# Patient Record
Sex: Female | Born: 1935 | Race: White | Hispanic: No | Smoking: Former smoker
Health system: Southern US, Community
[De-identification: ages and names within clinical notes are randomized; demographics above are authoritative.]

## PROBLEM LIST (undated history)

## (undated) DIAGNOSIS — G459 Transient cerebral ischemic attack, unspecified: Secondary | ICD-10-CM

## (undated) DIAGNOSIS — M2550 Pain in unspecified joint: Secondary | ICD-10-CM

## (undated) DIAGNOSIS — I5032 Chronic diastolic (congestive) heart failure: Secondary | ICD-10-CM

## (undated) DIAGNOSIS — S72002A Fracture of unspecified part of neck of left femur, initial encounter for closed fracture: Secondary | ICD-10-CM

## (undated) DIAGNOSIS — I10 Essential (primary) hypertension: Secondary | ICD-10-CM

## (undated) DIAGNOSIS — I739 Peripheral vascular disease, unspecified: Secondary | ICD-10-CM

## (undated) DIAGNOSIS — I341 Nonrheumatic mitral (valve) prolapse: Secondary | ICD-10-CM

## (undated) DIAGNOSIS — J449 Chronic obstructive pulmonary disease, unspecified: Secondary | ICD-10-CM

## (undated) DIAGNOSIS — R079 Chest pain, unspecified: Secondary | ICD-10-CM

## (undated) DIAGNOSIS — Z91199 Patient's noncompliance with other medical treatment and regimen due to unspecified reason: Secondary | ICD-10-CM

## (undated) DIAGNOSIS — D649 Anemia, unspecified: Secondary | ICD-10-CM

## (undated) DIAGNOSIS — E785 Hyperlipidemia, unspecified: Secondary | ICD-10-CM

## (undated) DIAGNOSIS — K219 Gastro-esophageal reflux disease without esophagitis: Secondary | ICD-10-CM

## (undated) DIAGNOSIS — R55 Syncope and collapse: Secondary | ICD-10-CM

## (undated) DIAGNOSIS — E119 Type 2 diabetes mellitus without complications: Secondary | ICD-10-CM

## (undated) DIAGNOSIS — I480 Paroxysmal atrial fibrillation: Secondary | ICD-10-CM

## (undated) DIAGNOSIS — M3500 Sicca syndrome, unspecified: Secondary | ICD-10-CM

## (undated) DIAGNOSIS — G500 Trigeminal neuralgia: Secondary | ICD-10-CM

## (undated) DIAGNOSIS — Z8701 Personal history of pneumonia (recurrent): Secondary | ICD-10-CM

## (undated) DIAGNOSIS — I779 Disorder of arteries and arterioles, unspecified: Secondary | ICD-10-CM

## (undated) DIAGNOSIS — K5792 Diverticulitis of intestine, part unspecified, without perforation or abscess without bleeding: Secondary | ICD-10-CM

## (undated) DIAGNOSIS — K449 Diaphragmatic hernia without obstruction or gangrene: Secondary | ICD-10-CM

## (undated) DIAGNOSIS — Z9119 Patient's noncompliance with other medical treatment and regimen: Secondary | ICD-10-CM

## (undated) HISTORY — DX: Nonrheumatic mitral (valve) prolapse: I34.1

## (undated) HISTORY — DX: Chronic obstructive pulmonary disease, unspecified: J44.9

## (undated) HISTORY — DX: Diverticulitis of intestine, part unspecified, without perforation or abscess without bleeding: K57.92

## (undated) HISTORY — PX: CATARACT EXTRACTION: SUR2

## (undated) HISTORY — DX: Anemia, unspecified: D64.9

## (undated) HISTORY — DX: Personal history of pneumonia (recurrent): Z87.01

## (undated) HISTORY — DX: Hyperlipidemia, unspecified: E78.5

## (undated) HISTORY — DX: Trigeminal neuralgia: G50.0

## (undated) HISTORY — DX: Fracture of unspecified part of neck of left femur, initial encounter for closed fracture: S72.002A

## (undated) HISTORY — DX: Disorder of arteries and arterioles, unspecified: I77.9

## (undated) HISTORY — DX: Syncope and collapse: R55

## (undated) HISTORY — DX: Peripheral vascular disease, unspecified: I73.9

## (undated) HISTORY — DX: Pain in unspecified joint: M25.50

## (undated) HISTORY — DX: Transient cerebral ischemic attack, unspecified: G45.9

## (undated) HISTORY — DX: Gastro-esophageal reflux disease without esophagitis: K21.9

## (undated) HISTORY — DX: Diaphragmatic hernia without obstruction or gangrene: K44.9

## (undated) HISTORY — DX: Chronic diastolic (congestive) heart failure: I50.32

## (undated) HISTORY — DX: Sjogren syndrome, unspecified: M35.00

---

## 1962-10-22 HISTORY — PX: ABDOMINAL HYSTERECTOMY: SHX81

## 1963-10-23 HISTORY — PX: CHOLECYSTECTOMY: SHX55

## 2004-11-09 ENCOUNTER — Inpatient Hospital Stay: Payer: Self-pay | Admitting: Internal Medicine

## 2004-12-27 ENCOUNTER — Ambulatory Visit: Payer: Self-pay | Admitting: Unknown Physician Specialty

## 2005-01-15 ENCOUNTER — Ambulatory Visit: Payer: Self-pay | Admitting: Internal Medicine

## 2006-02-26 ENCOUNTER — Ambulatory Visit: Payer: Self-pay | Admitting: Internal Medicine

## 2006-10-21 ENCOUNTER — Emergency Department: Payer: Self-pay | Admitting: Emergency Medicine

## 2006-10-22 ENCOUNTER — Other Ambulatory Visit: Payer: Self-pay

## 2007-03-03 ENCOUNTER — Ambulatory Visit: Payer: Self-pay | Admitting: Internal Medicine

## 2007-08-05 ENCOUNTER — Ambulatory Visit: Payer: Self-pay

## 2008-02-21 ENCOUNTER — Other Ambulatory Visit: Payer: Self-pay

## 2008-02-21 ENCOUNTER — Emergency Department: Payer: Self-pay | Admitting: Emergency Medicine

## 2008-03-04 ENCOUNTER — Ambulatory Visit: Payer: Self-pay | Admitting: Internal Medicine

## 2008-04-25 ENCOUNTER — Emergency Department: Payer: Self-pay | Admitting: Emergency Medicine

## 2008-04-25 ENCOUNTER — Other Ambulatory Visit: Payer: Self-pay

## 2008-07-14 ENCOUNTER — Encounter: Admission: RE | Admit: 2008-07-14 | Discharge: 2008-07-14 | Payer: Self-pay | Admitting: Cardiology

## 2009-03-09 ENCOUNTER — Ambulatory Visit: Payer: Self-pay | Admitting: Internal Medicine

## 2009-04-08 ENCOUNTER — Ambulatory Visit: Payer: Self-pay | Admitting: Gastroenterology

## 2009-05-23 ENCOUNTER — Inpatient Hospital Stay: Payer: Self-pay | Admitting: Internal Medicine

## 2009-06-04 ENCOUNTER — Inpatient Hospital Stay: Payer: Self-pay | Admitting: Internal Medicine

## 2009-06-17 ENCOUNTER — Emergency Department: Payer: Self-pay | Admitting: Emergency Medicine

## 2009-11-29 ENCOUNTER — Ambulatory Visit: Payer: Self-pay | Admitting: Cardiovascular Disease

## 2009-11-29 DIAGNOSIS — I059 Rheumatic mitral valve disease, unspecified: Secondary | ICD-10-CM | POA: Insufficient documentation

## 2009-11-29 DIAGNOSIS — K219 Gastro-esophageal reflux disease without esophagitis: Secondary | ICD-10-CM

## 2009-11-29 DIAGNOSIS — I509 Heart failure, unspecified: Secondary | ICD-10-CM

## 2009-11-29 DIAGNOSIS — I779 Disorder of arteries and arterioles, unspecified: Secondary | ICD-10-CM | POA: Insufficient documentation

## 2009-11-29 DIAGNOSIS — J438 Other emphysema: Secondary | ICD-10-CM

## 2009-11-29 DIAGNOSIS — G500 Trigeminal neuralgia: Secondary | ICD-10-CM

## 2009-11-29 DIAGNOSIS — M810 Age-related osteoporosis without current pathological fracture: Secondary | ICD-10-CM | POA: Insufficient documentation

## 2009-11-29 DIAGNOSIS — I739 Peripheral vascular disease, unspecified: Secondary | ICD-10-CM

## 2009-11-29 DIAGNOSIS — J449 Chronic obstructive pulmonary disease, unspecified: Secondary | ICD-10-CM | POA: Insufficient documentation

## 2009-11-29 DIAGNOSIS — R0602 Shortness of breath: Secondary | ICD-10-CM | POA: Insufficient documentation

## 2009-11-29 DIAGNOSIS — I10 Essential (primary) hypertension: Secondary | ICD-10-CM | POA: Insufficient documentation

## 2009-11-29 DIAGNOSIS — E785 Hyperlipidemia, unspecified: Secondary | ICD-10-CM

## 2009-11-29 DIAGNOSIS — M2550 Pain in unspecified joint: Secondary | ICD-10-CM | POA: Insufficient documentation

## 2009-11-29 DIAGNOSIS — M3500 Sicca syndrome, unspecified: Secondary | ICD-10-CM | POA: Insufficient documentation

## 2009-11-29 DIAGNOSIS — E1165 Type 2 diabetes mellitus with hyperglycemia: Secondary | ICD-10-CM

## 2009-11-29 DIAGNOSIS — G459 Transient cerebral ischemic attack, unspecified: Secondary | ICD-10-CM | POA: Insufficient documentation

## 2009-12-01 ENCOUNTER — Telehealth: Payer: Self-pay | Admitting: Cardiovascular Disease

## 2009-12-08 ENCOUNTER — Ambulatory Visit: Payer: Self-pay | Admitting: Cardiovascular Disease

## 2009-12-09 ENCOUNTER — Telehealth: Payer: Self-pay | Admitting: Cardiovascular Disease

## 2009-12-12 ENCOUNTER — Telehealth: Payer: Self-pay | Admitting: Cardiovascular Disease

## 2009-12-19 ENCOUNTER — Telehealth: Payer: Self-pay | Admitting: Cardiovascular Disease

## 2010-01-05 ENCOUNTER — Ambulatory Visit: Payer: Self-pay

## 2010-01-05 ENCOUNTER — Encounter: Payer: Self-pay | Admitting: Cardiovascular Disease

## 2010-01-17 ENCOUNTER — Ambulatory Visit: Payer: Self-pay | Admitting: Cardiovascular Disease

## 2010-02-09 ENCOUNTER — Encounter: Payer: Self-pay | Admitting: Cardiovascular Disease

## 2010-02-21 ENCOUNTER — Ambulatory Visit: Payer: Self-pay | Admitting: Unknown Physician Specialty

## 2010-02-23 ENCOUNTER — Telehealth: Payer: Self-pay | Admitting: Cardiovascular Disease

## 2010-03-06 ENCOUNTER — Telehealth: Payer: Self-pay | Admitting: Cardiovascular Disease

## 2010-04-11 ENCOUNTER — Encounter: Payer: Self-pay | Admitting: Cardiovascular Disease

## 2010-05-04 ENCOUNTER — Ambulatory Visit: Payer: Self-pay | Admitting: Internal Medicine

## 2010-05-26 ENCOUNTER — Ambulatory Visit: Payer: Self-pay | Admitting: Cardiovascular Disease

## 2010-05-26 ENCOUNTER — Telehealth: Payer: Self-pay | Admitting: Cardiovascular Disease

## 2010-05-29 ENCOUNTER — Telehealth: Payer: Self-pay | Admitting: Cardiovascular Disease

## 2010-05-29 ENCOUNTER — Inpatient Hospital Stay: Payer: Self-pay | Admitting: Internal Medicine

## 2010-06-01 ENCOUNTER — Ambulatory Visit: Payer: Self-pay | Admitting: Cardiovascular Disease

## 2010-06-03 ENCOUNTER — Telehealth: Payer: Self-pay | Admitting: Cardiovascular Disease

## 2010-06-15 ENCOUNTER — Encounter: Payer: Self-pay | Admitting: Cardiovascular Disease

## 2010-06-28 ENCOUNTER — Ambulatory Visit: Payer: Self-pay | Admitting: Vascular Surgery

## 2010-06-28 ENCOUNTER — Encounter: Payer: Self-pay | Admitting: Cardiovascular Disease

## 2010-08-02 ENCOUNTER — Encounter: Payer: Self-pay | Admitting: Cardiovascular Disease

## 2010-09-04 ENCOUNTER — Ambulatory Visit: Payer: Self-pay | Admitting: Cardiovascular Disease

## 2010-09-22 ENCOUNTER — Ambulatory Visit: Payer: Self-pay | Admitting: Cardiovascular Disease

## 2010-09-28 ENCOUNTER — Encounter: Payer: Self-pay | Admitting: Cardiovascular Disease

## 2010-10-24 ENCOUNTER — Ambulatory Visit
Admission: RE | Admit: 2010-10-24 | Discharge: 2010-10-24 | Payer: Self-pay | Source: Home / Self Care | Attending: Cardiovascular Disease | Admitting: Cardiovascular Disease

## 2010-10-24 ENCOUNTER — Encounter: Payer: Self-pay | Admitting: Cardiovascular Disease

## 2010-10-25 ENCOUNTER — Encounter: Payer: Self-pay | Admitting: Cardiovascular Disease

## 2010-11-16 ENCOUNTER — Emergency Department: Payer: Medicare Other | Admitting: Emergency Medicine

## 2010-11-16 ENCOUNTER — Encounter: Payer: Self-pay | Admitting: Cardiovascular Disease

## 2010-11-17 ENCOUNTER — Encounter: Payer: Self-pay | Admitting: Cardiovascular Disease

## 2010-11-17 ENCOUNTER — Telehealth: Payer: Self-pay | Admitting: Cardiovascular Disease

## 2010-11-23 NOTE — Assessment & Plan Note (Signed)
Summary: ROV/AMD   Primary Provider:  Dr. Randa Lynn   History of Present Illness: Catherine Holmes is a very pleasant 77 year old woman with a history of DM,  peripheral vascular disease, 60% carotid arterial disease as well as moderate disease arterial disease of the lower extremities, hyperlipidemia, hypertension with history of episodic shortness of breath and chest squeezing relieved with albuterol who presents after she was seen in urgent care this morning for shortness of breath.  Urgent care physician states that her blood pressure was elevated with systolic of 190, she had shortness of breath in urgent care. No treatments were given in urgent care. Instead of sending her to the emergency room, we stated that we would see her in the office.  She states that she woke up this morning with worsening shortness of breath. Her blood pressure has been poor over the past several days and she has had little bit of a headache. Her blood pressures at home have been more than 160 systolic. She has had some anxiety. her shortness of breath this morning seemed to moderately improved with one puff of albuterol. Typically she takes two puffs.   So far she is tolerating Crestor and her zetia.  Allergies: 1)  ! Ceclor 2)  ! Erythromycin 3)  ! Septra 4)  ! * Tavist D 5)  ! Pcn 6)  ! Darvon 7)  ! Codeine 8)  ! * Lisinopril 9)  ! Zocor 10)  ! * Advair 11)  ! Doxycycline 12)  ! * Glipizide 13)  ! * Forteo 14)  ! Crestor 15)  ! Lipitor  Physical Exam  General:  alert and oriented, no significant shortness of breath, appears comfortable. HEENT exam is benign, or fax is clear, neck is supple no JVP or carotid bruits, heart sounds are regular with normal S1-S2 no murmurs appreciated. Lungs have decreased breath sounds throughout though no wheezing or rales. Abdominal exam is benign, shows no significant lower extremity edema, neurologic exam is grossly nonfocal, skin is warm and dry.   Impression &  Recommendations:  Problem # 1:  DYSPNEA (ICD-786.05) Etiology of her shortness of breath is still uncertain though I am concerned for bronchospasm. Her symptoms seem to be relieved with albuterol. She has poor air flow on clinical exam. Her symptoms of shortness of breath have persisted despite good blood pressure. I believe that the shortness of breath is pushing her blood pressure upwards. She takes albuterol every 4 hours, Spiriva. She would like a DuoNeb at home and states that this has helped tremendously in the past. She went to urgent care today and they did not offer any  treatment. Her blood pressure has come down in the clinic with systolic 140, diastolic of 80.   I feel that she needs additional evaluation by pulmonary. She is unable to get an appointment the past 2 weeks and is to see Dr. Meredeth Ide next week. I will start her on a steroid inhaler, suggested she continue on her albuterol and Spiriva. We will restart her Norvasc for blood pressure.  we will hold her Coreg given that this can cause some bronchospasm. We'll start her on bystolic 10 mg by mouth once daily.  Her updated medication list for this problem includees:    Bystolic 10 Mg Tabs (Nebivolol hcl) .Marland Kitchen... 1 tab by mouth daily    Benicar Hct 40-25 Mg Tabs (Olmesartan medoxomil-hctz) .Marland Kitchen... 1 tab daily    Aspirin 81 Mg Tbec (Aspirin) .Marland Kitchen... Take one tablet by mouth daily  Hyzaar 100-25 Mg Tabs (Losartan potassium-hctz) .Marland Kitchen... 1 tab by mouth daily    Amlodipine Besylate 5 Mg Tabs (Amlodipine besylate) .Marland Kitchen... Take one tablet by mouth daily  Problem # 2:  HYPERTENSION, BENIGN (ICD-401.1) blood pressure is very elevated this AM which I suspect is due to her significant shortness of breath this morning.once her shortness of breath is improved, I believe her blood pressure will be also better. Her blood pressures and elevator the past several days though she states her breathing has been poor over the past several days as well. On her  last visit to the clinic her systolic pressure was 120. As mentioned, we will restart her amlodipine for blood pressure and she may only need to take this on an as-needed basis if we get her pulmonary status improved. Her updated medication list for this problem includes:    Bystolic 10 Mg Tabs (Nebivolol hcl) .Marland Kitchen... 1 tab by mouth daily    Benicar Hct 40-25 Mg Tabs (Olmesartan medoxomil-hctz) .Marland Kitchen... 1 tab daily    Clonidine Hcl 0.1 Mg Tabs (Clonidine hcl) .Marland Kitchen... 2 tabs every morning, 1 tab every afternoon, 2 tabs at bedtime    Aspirin 81 Mg Tbec (Aspirin) .Marland Kitchen... Take one tablet by mouth daily    Hyzaar 100-25 Mg Tabs (Losartan potassium-hctz) .Marland Kitchen... 1 tab by mouth daily    Amlodipine Besylate 5 Mg Tabs (Amlodipine besylate) .Marland Kitchen... Take one tablet by mouth daily  Problem # 3:  PVD (ICD-443.9)  Known moderate carotid arterial disease, known moderate disease of her lower extremity arteries. She does have claudication type symptoms. It has been sometime since she has had evaluation of her carotid and her lower extremity Dopplers and I have scheduled her for an u/s this  month.  Patient Instructions: 1)  Your physician recommends that you schedule a follow-up appointment in: 2 weeks 2)  Your physician has recommended you make the following change in your medication: stop coreg, start bystolic 10 mg daily, start amlopidine 5 mg daily, start flovent 2 puffs twice a day 3)  Your physician has requested that you regularly monitor and record your blood pressure readings at home.  Please use the same machine at the same time of day to check your readings and record them.  I will call you in 1 week to follow up on readings. Should you BP become elevated, you may take an extra amlopidine. Prescriptions: ADVAIR DISKUS 250-50 MCG/DOSE AEPB (FLUTICASONE-SALMETEROL) 2 puffs twice a day  #1 x 3   Entered by:   Charlena Cross, RN, BSN   Authorized by:   Dossie Arbour MD   Signed by:   Charlena Cross, RN, BSN on  12/08/2009   Method used:   [facility] to        [facility] [phone]* (retail)       [address]       [place], [place]  [address]       Ph: [phone]       Fax: [phone]   RxID:   [id number] AMLODIPINE BESYLATE 5 MG TABS (AMLODIPINE BESYLATE) Take one tablet by mouth daily  #30 x 3   Entered by:   Charlena Cross, RN, BSN   Authorized by:   Dossie Arbour MD   Signed by:   Charlena Cross, RN, BSN on 12/08/2009   Method used:   [facility]  [address] [phone][facility] (  retail)       [address]       [place], [place]  [address]       Ph: [phone]       Fax: [phone]   RxID:   [id number] BYSTOLIC 10 MG TABS (NEBIVOLOL HCL) 1 tab by mouth daily  #30 x 6   Entered by:   Charlena Cross, RN, BSN   Authorized by:   Dossie Arbour MD   Signed by:   Charlena Cross, RN, BSN on 12/08/2009   Method used:   [facility] to        [facility] [phone]* (retail)       [address]       [place], [place]  [address]       Ph: [phone]       Fax: [phone]   RxID:   [id number]

## 2010-11-23 NOTE — Miscellaneous (Signed)
Summary: Carotid/LE u/s  Clinical Lists Changes  Orders: Added new Test order of Carotid Duplex (Carotid Duplex) - Signed Added new Test order of Arterial Duplex Lower Extremity (Arterial Duplex Low) - Signed

## 2010-11-23 NOTE — Progress Notes (Signed)
Summary: PHI  PHI   Imported By: Harlon Flor 12/06/2009 16:10:26  _____________________________________________________________________  External Attachment:    Type:   Image     Comment:   External Document

## 2010-11-23 NOTE — Letter (Signed)
Summary: [facility]   Imported By: Harlon Flor 03/22/2010 15:28:18  _____________________________________________________________________  External Attachment:    Type:   Image     Comment:   External Document

## 2010-11-23 NOTE — Assessment & Plan Note (Signed)
Summary: rov/ewj   Visit Type:  Follow-up Referring Provider:  Ahren Pettinger Primary Provider:  Dr. Randa Lynn  CC:  C/o SOB. Denies chest pain and palpitations..  History of Present Illness: Catherine Holmes is a very pleasant 77 year old woman with a history of DM,  peripheral vascular disease, 60% carotid arterial disease as well as moderate  arterial disease of the lower extremities, status post bilateral iliac stenting, moderate renal stenosis, hyperlipidemia, severe  hypertension with history of episodic shortness of breath and chest squeezing relieved with albuterol who presents for routine followup.  Catherine Holmes has a very complicated blood pressure regimen though it appears to be working well for her. She did have one episode where her blood pressure climbed. She took an extra dose of clonidine and hydralazine and this improved her pressures. She does take some medications and one in the morning which she does not mind. This seems to help keep her blood pressures from spiking. Overall she is very happy with her blood pressure and does not want to make any changes.  She does report that recently she was noted to have a very high sodium and she was dehydrated by the blood work. We do not have this blood work available to [place] today. She has been recovering from a salivary gland infection with significant swelling that improved with prednisone and antibiotics. My understanding is that the blood work was done prior to the infection. She is hesitant to hold HCTZ given that her blood pressure is doing well.  carotid arterial ultrasound March 2011 shows 40-59% right internal carotid arterial disease, 60-79% left internal carotid arterial disease, this is unchanged from previous studies.  she has a history of statin intolerance with cramping in her hands even at low doses.  EKG shows normal sinus rhythm rate 67 beats per minute, no significant ST or T wave changes    Current Medications (verified): 1)  Bystolic  10 Mg Tabs (Nebivolol Hcl) .Marland Kitchen.. 1 Tab By Mouth Daily 2)  Benicar Hct 40-25 Mg Tabs (Olmesartan Medoxomil-Hctz) .Marland Kitchen.. 1 Tab Daily 3)  Metformin Hcl 500 Mg Tabs (Metformin Hcl) .Marland Kitchen.. 1 Tab By Mouth Daily 4)  Budeprion Sr 150 Mg Xr12h-Tab (Bupropion Hcl) .Marland Kitchen.. 1 Tab By Mouth Daily 5)  Dexilant 60 Mg Cpdr (Dexlansoprazole) .... Take 1 Tablet By Mouth Once A Day 6)  Aspirin 81 Mg Tbec (Aspirin) .... Take Two Tablets Once Daily 7)  Calcium-Vitamin D 600-200 Mg-Unit Tabs (Calcium-Vitamin D) .... 2 Tab Daily 8)  Multivitamins  Tabs (Multiple Vitamin) .Marland Kitchen.. 1 Tab By Mouth Daily 9)  Spiriva Handihaler 18 Mcg Caps (Tiotropium Bromide Monohydrate) .Marland Kitchen.. 1 Puff Daily 10)  Proair Hfa 108 (90 Base) Mcg/act Aers (Albuterol Sulfate) .... As Needed 11)  Zetia 10 Mg Tabs (Ezetimibe) .... Take One Tablet By Mouth Daily. 12)  Hydralazine Hcl 50 Mg Tabs (Hydralazine Hcl) .... Take 1 Tablet By Mouth Four Times A Day 13)  Bystolic 5 Mg Tabs (Nebivolol Hcl) .... Take 1 Tablet Daily With The 10mg  Bystolic 14)  Clonidine Hcl 0.2 Mg Tabs (Clonidine Hcl) .... Take One Tablet By Mouth Three Times A Day 15)  Cardura 2 Mg Tabs (Doxazosin Mesylate) .... Take 1 Tablet By Mouth Once A Day 16)  Ranitidine Hcl 150 Mg Caps (Ranitidine Hcl) .... Once Daily 17)  Vitamin D 400 Unit  Tabs (Cholecalciferol) .... Once Daily 18)  Ferrous Sulfate 325 (65 Fe) Mg  Tabs (Ferrous Sulfate) .... 2 Once Daily 19)  Pravastatin Sodium 20 Mg Tabs (Pravastatin Sodium) .Marland KitchenMarland KitchenMarland Kitchen  1/2 Tablet At Bedtime  Allergies (verified): 1)  ! Ceclor 2)  ! Erythromycin 3)  ! Septra 4)  ! * Tavist D 5)  ! Pcn 6)  ! Darvon 7)  ! Codeine 8)  ! * Lisinopril 9)  ! Zocor 10)  ! * Advair 11)  ! Doxycycline 12)  ! * Glipizide 13)  ! * Forteo 14)  ! Crestor 15)  ! Lipitor 16)  ! Levaquin  Past History:  Past Medical History: Last updated: 01/05/2010 HTN DM COPD osteoporosis carotid disease TIC doulowreus  arthrolgias sjogrema  syndrome TIA emphysema MVP CHF acid reflex diverticulitis  Past Surgical History: Last updated: 12/18/2009 hysterectomy gall bladder removed  Family History: Last updated: 2009/12/18 Father: Died at 52 heart attack Mother: Died at 106 heart attack Sister: Died at 41 heart attack  Social History: Last updated: 2009-12-18 Tobacco Use - Former. quit 10 years ago Alcohol Use - no Regular Exercise - no Drug Use - no  Risk Factors: Exercise: no (Dec 18, 2009)  Risk Factors: Smoking Status: quit (12/18/09)  Review of Systems  The patient denies fever, weight loss, weight gain, vision loss, decreased hearing, hoarseness, chest pain, syncope, dyspnea on exertion, peripheral edema, prolonged cough, abdominal pain, incontinence, muscle weakness, depression, and enlarged lymph nodes.         salivary gland swelling resolved  Vital Signs:  Patient profile:   77 year old female Height:      63 inches Weight:      175.50 pounds BMI:     31.20 Pulse rate:   81 / minute BP sitting:   128 / 78  (left arm) Cuff size:   regular  Vitals Entered By: Lysbeth Galas CMA (October 24, 2010 3:52 PM)  Physical Exam  General:  well-appearing elderly woman in no apparent distress, HEENT exam is benign, oropharynx is clear, neck is supple with no JVP. She does have 1+bilateral carotid bruits, heart sounds are regular with S1-S2 and a 2/6 systolic ejection murmur at the right sternal border, lungs are clear to auscultation with no wheezes Rales, abdominal exam is benign, no significant lower extremity edema, neurologic exam is grossly nonfocal, skin is warm and dry. 1+ pulses in her bilateral lower extremities and upper extremities.   Impression & Recommendations:  Problem # 1:  PVD (ICD-443.9) severe peripheral vascular disease. She is tolerating low-dose pravastatin with zetia. We'll try to obtain her most recent lipid panel from Dr. Randa Lynn.  Problem # 2:  HYPERTENSION, BENIGN  (ICD-401.1) Blood pressure is relatively well controlled on her current medication regimen. We will try to obtain her most recent basic metabolic panel as she reports an elevated sodium. If this does show hypernatremia, we could repeat this this month before making any changes to her medications.  Her updated medication list for this problem includes:    Bystolic 10 Mg Tabs (Nebivolol hcl) .Marland Kitchen... 1 tab by mouth daily    Benicar Hct 40-25 Mg Tabs (Olmesartan medoxomil-hctz) .Marland Kitchen... 1 tab daily    Aspirin 81 Mg Tbec (Aspirin) .Marland Kitchen... Take two tablets once daily    Hydralazine Hcl 50 Mg Tabs (Hydralazine hcl) .Marland Kitchen... Take 1 tablet by mouth four times a day    Bystolic 5 Mg Tabs (Nebivolol hcl) .Marland Kitchen... Take 1 tablet daily with the 10mg  bystolic    Clonidine Hcl 0.2 Mg Tabs (Clonidine hcl) .Marland Kitchen... Take one tablet by mouth three times a day    Cardura 2 Mg Tabs (Doxazosin mesylate) .Marland Kitchen... Take 1 tablet  by mouth once a day  Problem # 3:  HYPERLIPIDEMIA-MIXED (ICD-272.4) continue her current medication. Goal LDL is less than 70.Marland Kitchen  Her updated medication list for this problem includes:    Zetia 10 Mg Tabs (Ezetimibe) .Marland Kitchen... Take one tablet by mouth daily.    Pravastatin Sodium 20 Mg Tabs (Pravastatin sodium) .Marland Kitchen... 1/2 tablet at bedtime  Problem # 4:  CAROTID ARTERY DISEASE (ICD-433.10) She will need routine ultrasound studies in March which we can order for comparison to previous studies.  Her updated medication list for this problem includes:    Aspirin 81 Mg Tbec (Aspirin) .Marland Kitchen... Take two tablets once daily  Patient Instructions: 1)  Your physician recommends that you schedule a follow-up appointment in: 6 months 2)  Your physician recommends that you continue on your current medications as directed. Please refer to the Current Medication list given to you today.

## 2010-11-23 NOTE — Assessment & Plan Note (Signed)
Summary: NEW VISIT  Nurse Visit   Vital Signs:  Patient profile:   77 year old female Height:      63 inches Weight:      173 pounds BMI:     30.76 Pulse rate:   72 / minute BP sitting:   182 / 78  (left arm) Cuff size:   regular  Vitals Entered By: Benedict Needy, RN (May 26, 2010 9:55 AM) CC: HTN Comments pt reports her BPs from thursday being 209/86,176/73,202/86,197/85,217/93 and 210/89. Pt reports taking all of her prescribed BP medications, [facility]  Per Dr. Mariah Milling clonidine increased to 0.3 three times a day and Imdur added if BP continues to be >150.  Pt set up for renal artery [place] at the end of august. Pt instructed to call monday with BP readings.    CC:  HTN.   Current Medications (verified): 1)  Bystolic 10 Mg Tabs (Nebivolol Hcl) .Marland Kitchen.. 1 Tab By Mouth Daily 2)  Benicar Hct 40-25 Mg Tabs (Olmesartan Medoxomil-Hctz) .Marland Kitchen.. 1 Tab Daily 3)  Clonidine Hcl 0.1 Mg Tabs (Clonidine Hcl) .... 2 Tabs Every Morning, 1 Tab Every Afternoon, 2 Tabs At Bedtime 4)  Metformin Hcl 500 Mg Tabs (Metformin Hcl) .Marland Kitchen.. 1 Tab By Mouth Daily 5)  Budeprion Sr 150 Mg Xr12h-Tab (Bupropion Hcl) .Marland Kitchen.. 1 Tab By Mouth Daily 6)  Protonix 40 Mg Tbec (Pantoprazole Sodium) .Marland Kitchen.. 1 Tab By Mouth Daily 7)  Aspirin 81 Mg Tbec (Aspirin) .... Take One Tablet By Mouth Daily 8)  Calcium-Vitamin D 600-200 Mg-Unit Tabs (Calcium-Vitamin D) .... 2 Tab Daily 9)  Multivitamins  Tabs (Multiple Vitamin) .Marland Kitchen.. 1 Tab By Mouth Daily 10)  Spiriva Handihaler 18 Mcg Caps (Tiotropium Bromide Monohydrate) .Marland Kitchen.. 1 Puff Daily 11)  Proventil Hfa 108 (90 Base) Mcg/act Aers (Albuterol Sulfate) .... 2 Puffs Daily As Needed 12)  Zetia 10 Mg Tabs (Ezetimibe) .... Take One Tablet By Mouth Daily. 13)  Hydralazine Hcl 50 Mg Tabs (Hydralazine Hcl) .... Take One Tablet By Mouth Three Times A Day As Needed  Allergies (verified): 1)  ! Ceclor 2)  ! Erythromycin 3)  ! Septra 4)  ! * Tavist D 5)  ! Pcn 6)   ! Darvon 7)  ! Codeine 8)  ! * Lisinopril 9)  ! Zocor 10)  ! * Advair 11)  ! Doxycycline 12)  ! * Glipizide 13)  ! * Forteo 14)  ! Crestor 15)  ! Lipitor  Patient Instructions: 1)  Your physician has recommended you make the following change in your medication: INCREASE clonidine 0.3 three times a day take for 3 days if BP is not improved START imdur 30mg  daily if clonidine does not improve BP 2)  Your physician has requested that you regularly monitor and record your blood pressure readings at home.  Please use the same machine at the same time of day to check your readings and record them to bring to your follow-up visit. 3)  Your physician has requested that you have a renal artery duplex. During this test, an ultrasound is used to evaluate blood flow to the kidneys. Allow one hour for this exam. Do not eat after midnight the day before and avoid carbonated beverages. Take your medications as you usually do.   Orders Added: 1)  Renal Artery Duplex [Renal Artery Duplex] Prescriptions: HYDRALAZINE HCL 50 MG TABS (HYDRALAZINE HCL) Take 1 tablet by mouth four times a day  #120 x 4  Entered by:   Benedict Needy, RN   Authorized by:   Dossie Arbour MD   Signed by:   Benedict Needy, RN on 05/26/2010   Method used:   [facility] to        [facility] [phone]* (retail)       [address]       [place], [place]  [address]       Ph: [phone]       Fax: [phone]   RxID:   [id number] CLONIDINE HCL 0.3 MG TABS (CLONIDINE HCL) Take one tablet by mouth three times a day  #90 x 4   Entered by:   Benedict Needy, RN   Authorized by:   Dossie Arbour MD   Signed by:   Benedict Needy, RN on 05/26/2010   Method used:   [facility] to        [facility] [phone]* (retail)       [address]       [place], [place]  [address]       Ph: [phone]       Fax: [phone]   RxID:   [id number] IMDUR 30 MG XR24H-TAB (ISOSORBIDE MONONITRATE) Take 1  tablet by mouth once a day  #30 x 4   Entered by:   Benedict Needy, RN   Authorized by:   Dossie Arbour MD   Signed by:   Benedict Needy, RN on 05/26/2010   Method used:   Elect[facility]ically to        [facility] [phone]* (retail)       [address]       [place], [place]  [address]       Ph: [phone]       Fax: [phone]   RxID:   [id number]

## 2010-11-23 NOTE — Assessment & Plan Note (Signed)
Summary: rov/alt   Visit Type:  Follow-up Referring Provider:  gollan Primary Provider:  Dr. Randa Lynn  CC:  no complaints.  History of Present Illness: Catherine Holmes is a very pleasant 77 year old woman with a history of DM,  peripheral vascular disease, 60% carotid arterial disease as well as moderate  arterial disease of the lower extremities, hyperlipidemia, severe and difficult to manage hypertension with history of episodic shortness of breath and chest squeezing relieved with albuterol who presents for routine followup.  she reports that she has been very well on her current blood pressure medication. She does not take hydralazine 4 times a day, only 3 times a day. She has a routine down and has had well-controlled pressures. He isn't very difficult to manage and she has numerous medications.  She recently had bilateral iliac stenting. Renal artery stenosis was moderate and was not intervened upon.  carotid arterial ultrasound March 2011 shows 40-59% right internal carotid arterial disease, 60-79% left internal carotid arterial disease, this is unchanged from previous studies.  she has a history of statin intolerance with cramping in her hands even at low doses.  EKG shows normal sinus rhythm rate 67 beats per minute, no significant ST or T wave changes    Current Medications (verified): 1)  Bystolic 10 Mg Tabs (Nebivolol Hcl) .Marland Kitchen.. 1 Tab By Mouth Daily 2)  Benicar Hct 40-25 Mg Tabs (Olmesartan Medoxomil-Hctz) .Marland Kitchen.. 1 Tab Daily 3)  Metformin Hcl 500 Mg Tabs (Metformin Hcl) .Marland Kitchen.. 1 Tab By Mouth Daily 4)  Budeprion Sr 150 Mg Xr12h-Tab (Bupropion Hcl) .Marland Kitchen.. 1 Tab By Mouth Daily 5)  Dexilant 60 Mg Cpdr (Dexlansoprazole) .... Take 1 Tablet By Mouth Once A Day 6)  Aspirin 81 Mg Tbec (Aspirin) .... Take One Tablet By Mouth Daily 7)  Calcium-Vitamin D 600-200 Mg-Unit Tabs (Calcium-Vitamin D) .... 2 Tab Daily 8)  Multivitamins  Tabs (Multiple Vitamin) .Marland Kitchen.. 1 Tab By Mouth Daily 9)  Spiriva  Handihaler 18 Mcg Caps (Tiotropium Bromide Monohydrate) .Marland Kitchen.. 1 Puff Daily 10)  Proair Hfa 108 (90 Base) Mcg/act Aers (Albuterol Sulfate) .... As Needed 11)  Zetia 10 Mg Tabs (Ezetimibe) .... Take One Tablet By Mouth Daily. 12)  Hydralazine Hcl 50 Mg Tabs (Hydralazine Hcl) .... Take 1 Tablet By Mouth Four Times A Day 13)  Bystolic 5 Mg Tabs (Nebivolol Hcl) .... Take 1 Tablet By Mouth Once A Day As Needed For Hypertension 14)  Clonidine Hcl 0.2 Mg Tabs (Clonidine Hcl) .... Take One Tablet By Mouth Three Times A Day 15)  Cardura 2 Mg Tabs (Doxazosin Mesylate) .... Take 1 Tablet By Mouth Once A Day 16)  Ranitidine Hcl 150 Mg Caps (Ranitidine Hcl) .... Once Daily 17)  Vitamin D 400 Unit  Tabs (Cholecalciferol) .... Once Daily 18)  Ferrous Sulfate 325 (65 Fe) Mg  Tabs (Ferrous Sulfate) .... 2 Once Daily  Allergies (verified): 1)  ! Ceclor 2)  ! Erythromycin 3)  ! Septra 4)  ! * Tavist D 5)  ! Pcn 6)  ! Darvon 7)  ! Codeine 8)  ! * Lisinopril 9)  ! Zocor 10)  ! * Advair 11)  ! Doxycycline 12)  ! * Glipizide 13)  ! * Forteo 14)  ! Crestor 15)  ! Lipitor 16)  ! Levaquin  Past History:  Past Medical History: Last updated: 01/05/2010 HTN DM COPD osteoporosis carotid disease TIC doulowreus  arthrolgias sjogrema syndrome TIA emphysema MVP CHF acid reflex diverticulitis  Past Surgical History: Last updated: 11/29/2009 hysterectomy gall  bladder removed  Family History: Last updated: 2009-12-01 Father: Died at 48 heart attack Mother: Died at 35 heart attack Sister: Died at 54 heart attack  Social History: Last updated: December 01, 2009 Tobacco Use - Former. quit 10 years ago Alcohol Use - no Regular Exercise - no Drug Use - no  Risk Factors: Exercise: no (01-Dec-2009)  Risk Factors: Smoking Status: quit (2009-12-01)  Review of Systems  The patient denies fever, weight loss, weight gain, vision loss, decreased hearing, hoarseness, chest pain, syncope, dyspnea on  exertion, peripheral edema, prolonged cough, abdominal pain, incontinence, muscle weakness, depression, and enlarged lymph nodes.    Vital Signs:  Patient profile:   77 year old female Height:      63 inches Weight:      181 pounds BMI:     32.18 Pulse rate:   67 / minute BP sitting:   132 / 70  (left arm) Cuff size:   regular  Vitals Entered By: Hardin Negus, RMA (September 04, 2010 3:29 PM)  Physical Exam  General:  well-appearing elderly woman in no apparent distress, HEENT exam is benign, oropharynx is clear, neck is supple with no JVP. She does have 1+bilateral carotid bruits, heart sounds are regular with S1-S2 and a 2/6 systolic ejection murmur at the right sternal border, lungs are clear to auscultation with no wheezes Rales, abdominal exam is benign, no significant lower extremity edema, neurologic exam is grossly nonfocal, skin is warm and dry. 1+ pulses in her bilateral lower extremities and upper extremities.   Impression & Recommendations:  Problem # 1:  PVD (ICD-443.9) significant carotid disease, bilateral iliac disease with bilateral iliac stenting by Dr. Earnestine Leys. She is currently on aspirin, zetia. She's not been able to tolerate statins in the past. We will retry a statins though tried pravastatin 10 mg daily  Problem # 2:  HYPERLIPIDEMIA-MIXED (ICD-272.4) history of severe peripheral vascular disease with statin intolerance. We will start low-dose pravastatin.  Her updated medication list for this problem includes:    Zetia 10 Mg Tabs (Ezetimibe) .Marland Kitchen... Take one tablet by mouth daily.    Pravastatin Sodium 20 Mg Tabs (Pravastatin sodium) .Marland Kitchen... 1/2 tablet at bedtime  Problem # 3:  HYPERTENSION, BENIGN (ICD-401.1) Blood pressure is well-controlled on her current medication regimen. No significant side effects..  The following medications were removed from the medication list:    Catapres 0.2 Mg Tabs (Clonidine hcl) .Marland Kitchen... Three times a day Her updated medication  list for this problem includes:    Bystolic 10 Mg Tabs (Nebivolol hcl) .Marland Kitchen... 1 tab by mouth daily    Benicar Hct 40-25 Mg Tabs (Olmesartan medoxomil-hctz) .Marland Kitchen... 1 tab daily    Aspirin 81 Mg Tbec (Aspirin) .Marland Kitchen... Take two tablets once daily    Hydralazine Hcl 50 Mg Tabs (Hydralazine hcl) .Marland Kitchen... Take 1 tablet by mouth four times a day    Bystolic 5 Mg Tabs (Nebivolol hcl) .Marland Kitchen... Take 1 tablet by mouth once a day as needed for hypertension    Clonidine Hcl 0.2 Mg Tabs (Clonidine hcl) .Marland Kitchen... Take one tablet by mouth three times a day    Cardura 2 Mg Tabs (Doxazosin mesylate) .Marland Kitchen... Take 1 tablet by mouth once a day  Other Orders: EKG w/ Interpretation (93000)  Patient Instructions: 1)  Your physician recommends that you schedule a follow-up appointment in: 6 months. 2)  Your physician recommends that you continue on your current medications as directed. Please refer to the Current Medication list given to you today.  Start Pravastatin 20 mg take 1/2 tablet once daily and aspirin 81 mg take two tablets once daily. Prescriptions: PRAVASTATIN SODIUM 20 MG TABS (PRAVASTATIN SODIUM) 1/2 tablet at bedtime  #15 x 3   Entered by:   Bishop Dublin, CMA   Authorized by:   Dossie Arbour MD   Signed by:   Bishop Dublin, CMA on 09/04/2010   Method used:   [facility]        [facility] [phone]* (retail)       [address]       [place], [place]  [address]       Ph: [phone]       Fax: [phone]   RxID:   [id number]

## 2010-11-23 NOTE — Progress Notes (Signed)
Summary: clarification of medications  Phone Note Call from Patient   Caller: Daughter Summary of Call: Daughter Eunice Blase called to clarify medication changes as pt had some concerns.  instructed daughter that pt is to take spiriva and flovent daily and only use albuterol as needed.  stop coreg, bystolic 10 mg daily (pt has 5mg , 10mg , and 20mg  tablets at home), norvasc 5 mg at bedtime.   Initial call taken by: Charlena Cross, RN, BSN,  December 09, 2009 9:35 AM

## 2010-11-23 NOTE — Progress Notes (Signed)
Summary: Appointment  Phone Note Call from Patient Call back at [phone]   Caller: Daughter Call For: Dr. Karie Kirks Summary of Call: Patient's daughter called and stated that patient had appt. for carotids and lea tomorrow March 1st, but she is not feeling well and would not be able to make the appointment.  She rescheduled for Thurs. March 17th.  Patient's daughter said pt also had an appt with Dr. Mariah Milling this week for a follow-up 2 weeks and wanted to know if she should keep this appointment or rescheduled since these texts are being rescheduled. Initial call taken by: [place],  December 19, 2009 9:06 AM  Follow-up for Phone Call        Marchelle Folks- please reschedule her f/u with Dr. Mariah Milling until after tests.  Follow-up by: Charlena Cross, RN, BSN,  December 19, 2009 9:13 AM

## 2010-11-23 NOTE — Progress Notes (Signed)
  Phone Note Call from Patient   Reason for Call: Acute Illness Details for Reason: Severe Htn Details of Action Taken: Prescribed Cardura 2mg  by mouth daily Summary of Call: Pt. called to report consistently elevated SBP in the upper 160s to 2teens despite excellent medical compliance with new increased doses of her meds. (Clonidine .2mg  Q6hrs, Bystolic 15mg  at bedtime, Benicar 40mg  daily, HCTZ 25mg  daily, Hydralazine 50mg  Q6hrs)  She wondered should should increase anything, get a new med, or was a systolic BP in above range acceptable.  I informed her that I did not think it was acceptable especially since her average seem to be in 190-200 range (pt. is vert deligent in her BP monitoring).  I did not want to increase Bystolic much as this might slow her rate down also.  I did not want to add Aldactone as this might elevated K).  Decided to call in script for Cardura 2mg  daily and have her call office Monday for early appt. Monday.  Likely Aldactone will be a good option for this pt. as well but defer to Dr. Mariah Milling. Initial call taken by: Matt Spencer     Appended Document:  CAn we call and check BPs as follow up  Appended Document:  LMOM TCB  Appended Document:  Pt BP's have been improved since starting cardura 2mg . Reports todays BP 130/54-79, 139/60-83 and 167/62.  She normally is higher in the AM before meds. Med regimen AM 1 Benicar, Hydralazine and clonidine. 3pm clonidine and hydralazine. 8pm clonidine and hydralazine. Bedtime bystolic, cardura and hydralazine. If you want her to continue taking cardura [facility] [place] [facility] supply. Please advise.    Appended Document:  If it seems to be working, we could continue  Appended Document: cardura    Clinical Lists Changes  Medications: Added new medication of CARDURA 2 MG TABS (DOXAZOSIN MESYLATE) Take 1 tablet by mouth once a day - Signed Rx of CARDURA 2 MG TABS (DOXAZOSIN MESYLATE) Take 1  tablet by mouth once a day;  #90 x 2;  Signed;  Entered by: Benedict Needy, RN;  Authorized by: Dossie Arbour MD;  Method used: [facility] to [id number] [address], [place], [place], [place]  [address], Ph: [phone], Fax: [phone]    Prescriptions: CARDURA 2 MG TABS (DOXAZOSIN MESYLATE) Take 1 tablet by mouth once a day  #90 x 2   Entered by:   Benedict Needy, RN   Authorized by:   Dossie Arbour MD   Signed by:   Benedict Needy, RN on 07/28/2010   Method used:   Electronically to        [facility] [phone]* (retail)       [address]       [place], [place]  [address]       Ph: [phone]       Fax: [phone]   RxID:   [id number]

## 2010-11-23 NOTE — Assessment & Plan Note (Signed)
Summary: BP Check MES  Nurse Visit   Vital Signs:  Patient profile:   77 year old female Height:      63 inches Pulse rate:   70 / minute BP sitting:   156 / 74  (left arm) Cuff size:   regular  Vitals Entered By: Bishop Dublin, CMA (September 22, 2010 11:36 AM) CC: Blood pressure increased. Took two extra clonidine tablets. Comments BP systolically has been running >200 since am of 09/21/10.  Pt came to office for BP check, meds reviewed with pt. Dr. Gerda Diss medication list and adjusted meds.   Patient Instructions: 1)  Your physician recommends that you continue on your current medications as directed. Please refer to the Current Medication list given to you today. If Systolic BP remains elevated may take 1/2 tablet of Hydralazine 50mg  or Clonidine 0.2mg  one tablet daily as needed. If BP remains elevated please call office to schedule follow up appt   Past History:  Past Medical History: Last updated: 01/05/2010 HTN DM COPD osteoporosis carotid disease TIC doulowreus  arthrolgias sjogrema syndrome TIA emphysema MVP CHF acid reflex diverticulitis  Past Surgical History: Last updated: 2009-12-11 hysterectomy gall bladder removed  Family History: Last updated: 12/11/09 Father: Died at 39 heart attack Mother: Died at 25 heart attack Sister: Died at 74 heart attack  Social History: Last updated: 11-Dec-2009 Tobacco Use - Former. quit 10 years ago Alcohol Use - no Regular Exercise - no Drug Use - no  Risk Factors: Exercise: no (December 11, 2009)  Risk Factors: Smoking Status: quit (12/11/09)   Visit Type:  nurse visit  CC:  Blood pressure increased. Took two extra clonidine tablets..   Current Medications (verified): 1)  Bystolic 10 Mg Tabs (Nebivolol Hcl) .Marland Kitchen.. 1 Tab By Mouth Daily 2)  Benicar Hct 40-25 Mg Tabs (Olmesartan Medoxomil-Hctz) .Marland Kitchen.. 1 Tab Daily 3)  Metformin Hcl 500 Mg Tabs (Metformin Hcl) .Marland Kitchen.. 1 Tab By Mouth Daily 4)  Budeprion Sr  150 Mg Xr12h-Tab (Bupropion Hcl) .Marland Kitchen.. 1 Tab By Mouth Daily 5)  Dexilant 60 Mg Cpdr (Dexlansoprazole) .... Take 1 Tablet By Mouth Once A Day 6)  Aspirin 81 Mg Tbec (Aspirin) .... Take Two Tablets Once Daily 7)  Calcium-Vitamin D 600-200 Mg-Unit Tabs (Calcium-Vitamin D) .... 2 Tab Daily 8)  Multivitamins  Tabs (Multiple Vitamin) .Marland Kitchen.. 1 Tab By Mouth Daily 9)  Spiriva Handihaler 18 Mcg Caps (Tiotropium Bromide Monohydrate) .Marland Kitchen.. 1 Puff Daily 10)  Proair Hfa 108 (90 Base) Mcg/act Aers (Albuterol Sulfate) .... As Needed 11)  Zetia 10 Mg Tabs (Ezetimibe) .... Take One Tablet By Mouth Daily. 12)  Hydralazine Hcl 50 Mg Tabs (Hydralazine Hcl) .... Take 1 Tablet By Mouth Four Times A Day 13)  Bystolic 5 Mg Tabs (Nebivolol Hcl) .... Take 1 Tablet Daily With The 10mg  Bystolic 14)  Clonidine Hcl 0.2 Mg Tabs (Clonidine Hcl) .... Take One Tablet By Mouth Three Times A Day 15)  Cardura 2 Mg Tabs (Doxazosin Mesylate) .... Take 1 Tablet By Mouth Once A Day 16)  Ranitidine Hcl 150 Mg Caps (Ranitidine Hcl) .... Once Daily 17)  Vitamin D 400 Unit  Tabs (Cholecalciferol) .... Once Daily 18)  Ferrous Sulfate 325 (65 Fe) Mg  Tabs (Ferrous Sulfate) .... 2 Once Daily 19)  Pravastatin Sodium 20 Mg Tabs (Pravastatin Sodium) .... 1/2 Tablet At Bedtime  Allergies (verified): 1)  ! Ceclor 2)  ! Erythromycin 3)  ! Septra 4)  ! * Tavist D 5)  ! Pcn 6)  !  Darvon 7)  ! Codeine 8)  ! * Lisinopril 9)  ! Zocor 10)  ! * Advair 11)  ! Doxycycline 12)  ! * Glipizide 13)  ! * Forteo 14)  ! Crestor 15)  ! Lipitor 16)  ! Levaquin

## 2010-11-23 NOTE — Progress Notes (Signed)
Summary: chest tightness  Phone Note Call from Patient   Summary of Call: pts daughter called to state that Catherine Holmes was having same symtpoms of SOB and chest tightness again.  this time symptoms are accompanied by headache and nausea.  Catherine Holmes had not taken albuterol inhalers.  instructed daughter that Catherine Holmes needed to use inhaler and see if that helped chest tightness.  per Dr. Mariah Milling needs f/u with pulmonologist.

## 2010-11-23 NOTE — Assessment & Plan Note (Signed)
Summary: F6M/AMD   Visit Type:  Follow-up Referring Provider:  gollan Primary Provider:  Dr. Randa Lynn  CC:  F/U [facility].Marland Kitchen  History of Present Illness: Catherine Holmes is a very pleasant 77 year old woman with a history of DM,  peripheral vascular disease, 60% carotid arterial disease as well as moderate  arterial disease of the lower extremities, hyperlipidemia, severe and difficult to manage hypertension with history of episodic shortness of breath and chest squeezing relieved with albuterol who presents for routine followup.  recently, she has had difficulty with her blood pressure. She has been taking hydralazine 50 mg 3-4 times a day, clonidine 0.2 mg t.i.d., Benicar 40 mg with HCTZ 25 mg, bystolic 10 mg daily. HCTZ was held on a recent hospital admission to [facility] on August 8 due to hyponatremia. Sodium at that time was 129, creatinine was normal.  Since discharge, she is continued to have systolic pressures ranging from 150-180. She has a renal artery ultrasound pending.  carotid arterial ultrasound March 2011 shows 40-59% right internal carotid arterial disease, 60-79% left internal carotid arterial disease, this is unchanged from previous studies.  Ankle-brachial indexes are 0.90 on the right, 0.99 on the left.. she has monophasic waveforms at the right dorsalis pedis, triphasic waveforms on the left dorsalis pedis. Biphasic waveforms right posterior tibial, triphasic on the left posterior tibial    Current Medications (verified): 1)  Bystolic 10 Mg Tabs (Nebivolol Hcl) .Marland Kitchen.. 1 Tab By Mouth Daily 2)  Benicar Hct 40-25 Mg Tabs (Olmesartan Medoxomil-Hctz) .Marland Kitchen.. 1 Tab Daily 3)  Catapres 0.2 Mg Tabs (Clonidine Hcl) .... Three Times A Day 4)  Metformin Hcl 500 Mg Tabs (Metformin Hcl) .Marland Kitchen.. 1 Tab By Mouth Daily 5)  Budeprion Sr 150 Mg Xr12h-Tab (Bupropion Hcl) .Marland Kitchen.. 1 Tab By Mouth Daily 6)  Protonix 40 Mg Tbec (Pantoprazole Sodium) .Marland Kitchen.. 1 Tab By Mouth Daily 7)  Aspirin 81 Mg Tbec (Aspirin) ....  Take One Tablet By Mouth Daily 8)  Calcium-Vitamin D 600-200 Mg-Unit Tabs (Calcium-Vitamin D) .... 2 Tab Daily 9)  Multivitamins  Tabs (Multiple Vitamin) .Marland Kitchen.. 1 Tab By Mouth Daily 10)  Spiriva Handihaler 18 Mcg Caps (Tiotropium Bromide Monohydrate) .Marland Kitchen.. 1 Puff Daily 11)  Proventil Hfa 108 (90 Base) Mcg/act Aers (Albuterol Sulfate) .... 2 Puffs Daily As Needed 12)  Zetia 10 Mg Tabs (Ezetimibe) .... Take One Tablet By Mouth Daily. 13)  Hydralazine Hcl 50 Mg Tabs (Hydralazine Hcl) .... Take 1 Tablet By Mouth Four Times A Day 14)  Imdur 30 Mg Xr24h-Tab (Isosorbide Mononitrate) .... Take 1 Tablet By Mouth Once A Day  Allergies (verified): 1)  ! Ceclor 2)  ! Erythromycin 3)  ! Septra 4)  ! * Tavist D 5)  ! Pcn 6)  ! Darvon 7)  ! Codeine 8)  ! * Lisinopril 9)  ! Zocor 10)  ! * Advair 11)  ! Doxycycline 12)  ! * Glipizide 13)  ! * Forteo 14)  ! Crestor 15)  ! Lipitor  Review of Systems  The patient denies fever, weight loss, weight gain, vision loss, decreased hearing, hoarseness, chest pain, syncope, dyspnea on exertion, peripheral edema, prolonged cough, abdominal pain, incontinence, muscle weakness, depression, and enlarged lymph nodes.    Vital Signs:  Patient profile:   77 year old female Height:      63 inches Weight:      175 pounds BMI:     31.11 Pulse rate:   74 / minute BP sitting:   181 / 76  (left  arm) Cuff size:   large  Vitals Entered By: Bishop Dublin, CMA (June 01, 2010 3:57 PM)  Physical Exam  General:  well-appearing elderly woman in no apparent distress, HEENT exam is benign, oropharynx is clear, neck is supple with no JVP. She does have 1+bilateral carotid bruits, heart sounds are regular with S1-S2 and a 2/6 systolic ejection murmur at the right sternal border, lungs are clear to auscultation with no wheezes Rales, abdominal exam is benign, no significant lower extremity edema, neurologic exam is grossly nonfocal, skin is warm and dry. 1+ pulses in her  bilateral lower extremities and upper extremities.   Impression & Recommendations:  Problem # 1:  HYPERTENSION, BENIGN (ICD-401.1) we have suggested that she stay on her current regimen, adding additional doses of clonidine 0.1 mg p.r.n., hydralazine 50 mg p.r.n. for systolic pressure greater than 160. Renal artery ultrasound is pending.  Her updated medication list for this problem includes:    Bystolic 10 Mg Tabs (Nebivolol hcl) .Marland Kitchen... 1 tab by mouth daily    Benicar Hct 40-25 Mg Tabs (Olmesartan medoxomil-hctz) .Marland Kitchen... 1 tab daily    Catapres 0.2 Mg Tabs (Clonidine hcl) .Marland Kitchen... Three times a day    Aspirin 81 Mg Tbec (Aspirin) .Marland Kitchen... Take one tablet by mouth daily    Hydralazine Hcl 50 Mg Tabs (Hydralazine hcl) .Marland Kitchen... Take 1 tablet by mouth four times a day    Bystolic 5 Mg Tabs (Nebivolol hcl) .Marland Kitchen... Take 1 tablet by mouth once a day as needed for hypertension    Clonidine Hcl 0.1 Mg Tabs (Clonidine hcl) .Marland Kitchen... Take one tablet by mouth twice a day as needed for bp >180  Problem # 2:  PVD (ICD-443.9) She has hyperlipidemia, known peripheral vascular disease with intolerance of statins. Repeat carotid ultrasound next year in March 2012.  Problem # 3:  HYPERLIPIDEMIA-MIXED (ICD-272.4) we will try to obtain the most recent lipid panel for our records. Goal LDL is less than 70. I suspect that Z. he will not be enough alone though she has a statin intolerance.  Her updated medication list for this problem includes:    Zetia 10 Mg Tabs (Ezetimibe) .Marland Kitchen... Take one tablet by mouth daily.  Patient Instructions: 1)  Your physician has recommended you make the following change in your medication: Bystolic 5mg  once daily for HTN 2)  Your physician wants you to follow-up in:  3 months  You will receive a reminder letter in the mail two months in advance. If you don't receive a letter, please call our office to schedule the follow-up appointment. Prescriptions: CLONIDINE HCL 0.1 MG TABS (CLONIDINE HCL)  Take one tablet by mouth twice a day as needed for BP >180  #90 x 4   Entered by:   Benedict Needy, RN   Authorized by:   Dossie Arbour MD   Signed by:   Benedict Needy, RN on 06/01/2010   Method used:   [facility]  [address] [phone]* (retail)       [address]       [place], [place]  [address]       Ph: [phone]       Fax: [phone]   RxID:   [id number] HYDRALAZINE HCL 50 MG TABS (HYDRALAZINE HCL) Take 1 tablet by mouth four times a day  #360 x 4   Entered by:   Benedict Needy, RN   Authorized  by:   Dossie Arbour MD   Signed by:   Benedict Needy, RN on 06/01/2010   Method used:   [facility] to        [facility] [phone]* (retail)       [address]       [place], [place]  [address]       Ph: [phone]       Fax: [phone]   RxID:   [id number]

## 2010-11-23 NOTE — Progress Notes (Addendum)
  Phone Note Call from Patient   Caller: Daughter Eunice Blase) Details for Reason: ER stay 11/16/2010 Summary of Call: Patient was in the ER 11/16/2010 with elevated blood pressure of 220/120.  The patient was evaluated and sent home with NTG.  She was not instructed to change any of her medications.  What do you recommend she do with her blood pressure over the weekend?  Blood pressure  reading 167/84 at 3:00 with heart rate 77.  She is feeling okay today.  Initial call taken by: Bishop Dublin, CMA,  November 17, 2010 3:41 PM  Follow-up for Phone Call        Told patient to take an extra  hydralazine or clonidine if blood pressure elevated again as Dr. Mariah Milling instructed in the past if blood pressure elevated.  Told patient if over the weekend blood pressure elevated and doesn't seem to come down wtih the extra hydralazine or clonideine than to call the on call physician. Told will send ER notes to Dr. Mariah Milling for review.  Also explained that with pain your blood pressure will also increase. Follow-up by: Bishop Dublin, CMA,  November 17, 2010 3:53 PM  Additional Follow-up for Phone Call Additional follow up Details #1::        I would agree with above. She has labile blood pressure and can take extra if needed to avoid going to the ER. Can she monitor BP for [place]?

## 2010-11-23 NOTE — Progress Notes (Signed)
Summary: BP running in 200's   Phone Note Call from Patient   Call For: Diago Haik Summary of Call: pt called this am about her BP, BP has been running high in the 200's with dyastolic in the high 90 sometimes 100's.  She stoped the Norvasc a few weeks ago becuase of swelling inher legs. Please advise on what to do next. Cala Bradford :) told pt that we would call her back before we call in medication.  Initial call taken by: Mercer Pod,  Feb 23, 2010 10:16 AM  Follow-up for Phone Call        PT IS STILL WAITING TO HEAR ABOUT THIS MESSAGE-STATES THAT SHE THOUGHT ANOTHER MEDICATION WAS BEING CALLED IN Follow-up by: Harlon Flor,  Feb 23, 2010 3:47 PM  Additional Follow-up for Phone Call Additional follow up Details #1::        Called spoke with pt. Pt states she is currently on Bystolic 10mg  once daily, Benicar/HCT 40/25mg  once daily, and Clonidine 0.1mg  2 tabs in am, 1 tab in afternoon, 2 tabs in pm.  Per Dr Mariah Milling OK to start taking Hydralazine 50mg  three times a day as needed. Instructed pt to call back next week if systolic BP remains elevated and we will re-evaluate. Additional Follow-up by: Cloyde Reams RN,  Feb 24, 2010 11:48 AM    New/Updated Medications: HYDRALAZINE HCL 50 MG TABS (HYDRALAZINE HCL) Take one tablet by mouth three times a day as needed Prescriptions: HYDRALAZINE HCL 50 MG TABS (HYDRALAZINE HCL) Take one tablet by mouth three times a day as needed  #90 x 2   Entered by:   Cloyde Reams RN   Authorized by:   Dossie Arbour MD   Signed by:   Cloyde Reams RN on 02/24/2010   Method used:   Electronically to        [facility] [phone]* (retail)       [address]       [place], [place]  [address]       Ph: [phone]       Fax: [phone]   RxID:   [id number]

## 2010-11-23 NOTE — Progress Notes (Signed)
Summary: QUESTION FOR RN  Phone Note Call from Patient Call back at Home Phone [phone]   Caller: Daughter Call For: RN Summary of Call: PATIENT'S DAUGHTER CALLED AND WOULD LIKE FOR RN TO CALL HER BACK Initial call taken by: [place] Carbo,  May 26, 2010 2:03 PM  Follow-up for Phone Call        pt wanted to let me know that her BP was 110. Asked her to continue taking her BP and writing what time she is taking her med and when she takes extra hydralazine.  Follow-up by: Benedict Needy, RN,  May 26, 2010 2:54 PM

## 2010-11-23 NOTE — Progress Notes (Signed)
Summary: BP issues  Phone Note Call from Patient   Caller: Patient Call For: Josep Luviano Summary of Call: Pt called reporting BP 195/70 during the night with prescribed medications. Took clonidine 0.1mg  early this AM after the clonidine BP 170/66.  Pt reported tingling in left arm and hand. Asked her if this had these symptoms before she reported no.  Asked her to go to ER.  Initial call taken by: Benedict Needy, RN,  May 29, 2010 9:40 AM     Appended Document: BP issues pt d/c from ER requested Dr. Mariah Milling to order MRI of brain asked Mrs Derick to call her PCP to request MRI of brain. She was in aggreed and was to call Dr. Evlyn Clines office.

## 2010-11-23 NOTE — Op Note (Signed)
[facility]   [facility]   Imported By: Roderic Ovens 08/02/2010 10:16:28  _____________________________________________________________________  External Attachment:    Type:   Image     Comment:   External Document

## 2010-11-23 NOTE — Letter (Signed)
Summary: [facility]   [facility]   Imported By: Roderic Ovens 08/14/2010 16:03:35  _____________________________________________________________________  External Attachment:    Type:   Image     Comment:   External Document

## 2010-11-23 NOTE — Assessment & Plan Note (Signed)
Summary: rov   Visit Type:  Follow-up Referring Provider:  Truth Wolaver Primary Provider:  Dr. Randa Lynn  CC:  no cp, sob gotten better, no edema, and thinks she may have a little fluid retained. Catherine Holmes  History of Present Illness: Ms. Fabry is a very pleasant 77 year old woman with a history of DM,  peripheral vascular disease, 60% carotid arterial disease as well as moderate  arterial disease of the lower extremities, hyperlipidemia, hypertension with history of episodic shortness of breath and chest squeezing relieved with albuterol who presents for routine followup.  since we last saw her in clinic, she states that her blood pressure has been relatively well controlled. We added amlodipine back to her regimen to and she states that this has made all the difference. She continues to have severe GERD symptoms and has been seen by GI. They are retrying a new proton pump inhibitor,dexillant.  she denies any recent episodes of chest tightness or shortness of breath. She has had some hand cramping with the Crestor and has held recently though we'll retry at on an every other day basis.  carotid arterial ultrasound March 2011 shows 40-59% right internal carotid arterial disease, 60-79% left internal carotid arterial disease, this is unchanged from previous studies.  Ankle-brachial indexes are 0.90 on the right, 0.99 on the left.. she has monophasic waveforms at the right dorsalis pedis, triphasic waveforms on the left dorsalis pedis. Biphasic waveforms right posterior tibial, triphasic on the left posterior tibial  Current Problems (verified): 1)  Hypertension, Benign  (ICD-401.1) 2)  Hyperlipidemia-mixed  (ICD-272.4) 3)  Pvd  (ICD-443.9) 4)  Dyspnea  (ICD-786.05) 5)  Pvd  (ICD-443.9) 6)  Arthralgia  (ICD-719.40) 7)  Sjogren's Syndrome  (ICD-710.2) 8)  Tic Douloureux  (ICD-350.1) 9)  Gerd  (ICD-530.81) 10)  CHF  (ICD-428.0) 11)  Mitral Valve Prolapse  (ICD-424.0) 12)  Tia  (ICD-435.9) 13)  Emphysema   (ICD-492.8) 14)  Hyperlipidemia  (ICD-272.4) 15)  Carotid Artery Disease  (ICD-433.10) 16)  Osteoporosis  (ICD-733.00) 17)  COPD  (ICD-496) 18)  Dm  (ICD-250.00) 19)  Hypertension, Benign  (ICD-401.1)  Current Medications (verified): 1)  Bystolic 10 Mg Tabs (Nebivolol Hcl) .Catherine Holmes.. 1 Tab By Mouth Daily 2)  Benicar Hct 40-25 Mg Tabs (Olmesartan Medoxomil-Hctz) .Catherine Holmes.. 1 Tab Daily 3)  Clonidine Hcl 0.1 Mg Tabs (Clonidine Hcl) .... 2 Tabs Every Morning, 1 Tab Every Afternoon, 2 Tabs At Bedtime 4)  Metformin Hcl 500 Mg Tabs (Metformin Hcl) .Catherine Holmes.. 1 Tab By Mouth Daily 5)  Budeprion Sr 150 Mg Xr12h-Tab (Bupropion Hcl) .Catherine Holmes.. 1 Tab By Mouth Daily 6)  Protonix 40 Mg Tbec (Pantoprazole Sodium) .Catherine Holmes.. 1 Tab By Mouth Daily 7)  Aspirin 81 Mg Tbec (Aspirin) .... Take One Tablet By Mouth Daily 8)  Calcium-Vitamin D 600-200 Mg-Unit Tabs (Calcium-Vitamin D) .... 2 Tab Daily 9)  Multivitamins  Tabs (Multiple Vitamin) .Catherine Holmes.. 1 Tab By Mouth Daily 10)  Spiriva Handihaler 18 Mcg Caps (Tiotropium Bromide Monohydrate) .Catherine Holmes.. 1 Puff Daily 11)  Proventil Hfa 108 (90 Base) Mcg/act Aers (Albuterol Sulfate) .... 2 Puffs Daily As Needed 12)  Hyzaar 100-25 Mg Tabs (Losartan Potassium-Hctz) .Catherine Holmes.. 1 Tab By Mouth Daily 13)  Zetia 10 Mg Tabs (Ezetimibe) .... Take One Tablet By Mouth Daily. 14)  Amlodipine Besylate 5 Mg Tabs (Amlodipine Besylate) .... Take One Tablet By Mouth Daily  Allergies (verified): 1)  ! Ceclor 2)  ! Erythromycin 3)  ! Septra 4)  ! * Tavist D 5)  ! Pcn 6)  !  Darvon 7)  ! Codeine 8)  ! * Lisinopril 9)  ! Zocor 10)  ! * Advair 11)  ! Doxycycline 12)  ! * Glipizide 13)  ! * Forteo 14)  ! Crestor 15)  ! Lipitor  Past History:  Past Medical History: Last updated: 01/05/2010 HTN DM COPD osteoporosis carotid disease TIC doulowreus  arthrolgias sjogrema syndrome TIA emphysema MVP CHF acid reflex diverticulitis  Past Surgical History: Last updated: 12-01-09 hysterectomy gall bladder  removed  Family History: Last updated: Dec 01, 2009 Father: Died at 73 heart attack Mother: Died at 2 heart attack Sister: Died at 69 heart attack  Social History: Last updated: 2009/12/01 Tobacco Use - Former. quit 10 years ago Alcohol Use - no Regular Exercise - no Drug Use - no  Risk Factors: Exercise: no (2009-12-01)  Risk Factors: Smoking Status: quit (12/01/09)  Review of Systems  The patient denies fever, vision loss, decreased hearing, hoarseness, chest pain, syncope, dyspnea on exertion, peripheral edema, prolonged cough, abdominal pain, incontinence, muscle weakness, depression, and enlarged lymph nodes.         GERD Sx  Vital Signs:  Patient profile:   77 year old female Height:      63 inches Weight:      167.75 pounds BMI:     29.82 Pulse rate:   77 / minute Pulse rhythm:   regular BP sitting:   128 / 70  (right arm) Cuff size:   small  Vitals Entered By: Mercer Pod (January 17, 2010 4:38 PM)  Physical Exam  General:  well-appearing elderly woman in no apparent distress, HEENT exam is benign, oropharynx is clear, neck is supple with no JVP. She does have 1+bilateral carotid bruits, heart sounds are regular with S1-S2 and a 2/6 systolic ejection murmur at the right sternal border, lungs are clear to auscultation with no wheezes Rales, abdominal exam is benign, no significant lower extremity edema, neurologic exam is grossly nonfocal, skin is warm and dry. 1+ pulses in her bilateral lower extremities and upper extremities.   Impression & Recommendations:  Problem # 1:  PVD (ICD-443.9)   Known moderate carotid arterial disease, known moderate disease of her lower extremity arteries. her carotid Dopplers done in March of this year are essentially unchanged. Lower extremity arterial Dopplers show good distal flow with predominantly biphasic and triphasic waveforms. we have suggested to her that these can be repeated next year.  Problem # 2:   HYPERLIPIDEMIA-MIXED (ICD-272.4) she has had a difficult time tolerating Crestor 5 mg every day and we have suggested she try every other day. She seems to tolerate that it well.  The following medications were removed from the medication list:    Crestor 5 Mg Tabs (Rosuvastatin calcium) .Catherine Holmes... Take one tablet by mouth daily. Her updated medication list for this problem includes:    Zetia 10 Mg Tabs (Ezetimibe) .Catherine Holmes... Take one tablet by mouth daily.  Problem # 3:  HYPERTENSION, BENIGN (ICD-401.1) I am truly impressed that her blood pressure is currently well controlled. We have made no changes and will continue her on her current medication regimen as she seems to be tolerating it well with no periods of hypotension or near-syncope.  Her updated medication list for this problem includes:    Bystolic 10 Mg Tabs (Nebivolol hcl) .Catherine Holmes... 1 tab by mouth daily    Benicar Hct 40-25 Mg Tabs (Olmesartan medoxomil-hctz) .Catherine Holmes... 1 tab daily    Clonidine Hcl 0.1 Mg Tabs (Clonidine hcl) .Catherine Holmes... 2 tabs every morning,  1 tab every afternoon, 2 tabs at bedtime    Aspirin 81 Mg Tbec (Aspirin) .Catherine Holmes... Take one tablet by mouth daily    Hyzaar 100-25 Mg Tabs (Losartan potassium-hctz) .Catherine Holmes... 1 tab by mouth daily    Amlodipine Besylate 5 Mg Tabs (Amlodipine besylate) .Catherine Holmes... Take one tablet by mouth daily

## 2010-11-23 NOTE — Progress Notes (Signed)
Summary: PROBLEMS  Phone Note Call from Patient   Caller: Daughter Call For: [facility] Summary of Call: THE NEW MEDICATION IS NOT KEEPING HER BP DOWN AND SHE IS HAVING HEADACHES-PLEASE CALL DAUGHTER DEBBIE HATLEY AT # [phone] Initial call taken by: Harlon Flor,  December 12, 2009 2:42 PM  Follow-up for Phone Call        per pts daughter, pts BP has been running 168-206/80's on bystolic.  pts daughter would like for her to go back to coreg.  HR: 70-86 Follow-up by: Charlena Cross, RN, BSN,  December 13, 2009 9:40 AM  Additional Follow-up for Phone Call Additional follow up Details #1::        Would increase bystolic to 10 mg daily. Coreg not good for respiratory problem and shortness of breath. Will need to add hydralazine 25 mg three times a day with meals. Continue to check BP and heart rate     Appended Document: PROBLEMS spoke with pt last week.  pt states that BP has been normal last week since starting medication for inner ear problem.  instructed pt to continue to monitor BP and to let us know if it becomes elevated again.  if this occurs we will add medication changes that Dr. Mariah Milling previously recommended.  Charlena Cross RN BSN

## 2010-11-23 NOTE — Assessment & Plan Note (Signed)
Summary: np6   Visit Type:  New patient Primary Provider:  Dr. Randa Lynn  CC:  SOB and pain in chest and right arm.  History of Present Illness: Catherine Holmes is a very pleasant 77 year old woman with a history of DM,  peripheral vascular disease, 60% carotid arterial disease as well as moderate disease arterial disease of the lower extremities, hyperlipidemia, hypertension with history of episodic shortness of breath and chest squeezing relieved with albuterol who presents for an episode of shortness of breath.  [name] states that she took 2 puffs of her albuterol today and her symptoms resolved. Periodically she feels a tightness in the chest, a feeling like she has a belt around her lungs. She is otherwise been very active and since her symptoms have resolved, she is very comfortable with no reports of any complaints. She does have these episodes of shortness of breath on periodic basis.  on review of her medications, she states that she is off ZE a, currently not on any cholesterol medication, off her Tekturna, and off amlodipine. She is very concerned about the cost of the Benicar.  Preventive Screening-Counseling & Management  Alcohol-Tobacco     Smoking Status: quit  Caffeine-Diet-Exercise     Does Patient Exercise: no      Drug Use:  no.    Current Problems (verified): None  Current Medications (verified): 1)  Carvedilol 25 Mg Tabs (Carvedilol) .... Take One Tablet By Mouth Twice A Day 2)  Benicar Hct 40-25 Mg Tabs (Olmesartan Medoxomil-Hctz) .Marland Kitchen.. 1 Tab Daily 3)  Clonidine Hcl 0.1 Mg Tabs (Clonidine Hcl) .... 2 Tabs Every Morning, 1 Tab Every Afternoon, 2 Tabs At Bedtime 4)  Metformin Hcl 500 Mg Tabs (Metformin Hcl) .Marland Kitchen.. 1 Tab By Mouth Daily 5)  Budeprion Sr 150 Mg Xr12h-Tab (Bupropion Hcl) .Marland Kitchen.. 1 Tab By Mouth Daily 6)  Protonix 40 Mg Tbec (Pantoprazole Sodium) .Marland Kitchen.. 1 Tab By Mouth Daily 7)  Aspirin 81 Mg Tbec (Aspirin) .... Take One Tablet By Mouth Daily 8)  Calcium-Vitamin D  600-200 Mg-Unit Tabs (Calcium-Vitamin D) .... 2 Tab Daily 9)  Multivitamins  Tabs (Multiple Vitamin) .Marland Kitchen.. 1 Tab By Mouth Daily 10)  Spiriva Handihaler 18 Mcg Caps (Tiotropium Bromide Monohydrate) .Marland Kitchen.. 1 Puff Daily 11)  Proventil Hfa 108 (90 Base) Mcg/act Aers (Albuterol Sulfate) .... 2 Puffs Daily As Needed  Allergies (verified): 1)  ! Ceclor 2)  ! Erythromycin 3)  ! Septra 4)  ! * Tavist D 5)  ! Pcn 6)  ! Darvon 7)  ! Codeine 8)  ! * Lisinopril 9)  ! Zocor 10)  ! * Advair 11)  ! Doxycycline 12)  ! * Glipizide 13)  ! * Forteo 14)  ! Crestor 15)  ! Lipitor  Past History:  Family History: Last updated: 12-08-2009 Father: Died at 36 heart attack Mother: Died at 71 heart attack Sister: Died at 67 heart attack  Social History: Last updated: December 08, 2009 Tobacco Use - Former. quit 10 years ago Alcohol Use - no Regular Exercise - no Drug Use - no  Risk Factors: Exercise: no (12-08-2009)  Risk Factors: Smoking Status: quit (2009-12-08)  Past Medical History: Heart murmur 2010 Hypertension  Family History: Father: Died at 70 heart attack Mother: Died at 16 heart attack Sister: Died at 23 heart attack  Social History: Tobacco Use - Former. quit 10 years ago Alcohol Use - no Regular Exercise - no Drug Use - no Smoking Status:  quit Does Patient Exercise:  no Drug Use:  no  Review of Systems  The patient denies anorexia, fever, weight loss, weight gain, vision loss, decreased hearing, hoarseness, chest pain, syncope, dyspnea on exertion, peripheral edema, prolonged cough, headaches, hemoptysis, abdominal pain, melena, hematochezia, severe indigestion/heartburn, hematuria, incontinence, genital sores, muscle weakness, suspicious skin lesions, transient blindness, difficulty walking, depression, unusual weight change, abnormal bleeding, enlarged lymph nodes, angioedema, breast masses, and testicular masses.         Chest pain and SOB breath earlier today has resolved.    New Orders:     1)  Carotid Duplex (Carotid Duplex)     2)  Arterial Duplex Lower Extremity (Arterial Duplex Low)   Vital Signs:  Patient profile:   77 year old female Height:      63 inches Weight:      167.75 pounds BMI:     29.82 Pulse rate:   66 / minute Pulse rhythm:   regular BP sitting:   124 / 70  (left arm) Cuff size:   regular  Vitals Entered By: Mercer Pod (November 29, 2009 3:24 PM)  Physical Exam  General:  H. EENT exam is benign, oropharynx is clear, neck is supple with no JVP, 2+ carotid bruit on the left, heart sounds are regular with S1-S2 no murmurs appreciated, lungs are clear to auscultation with no wheezes or rales abdominal exam is benign, no significant lower extremity edema, neurological exam is grossly nonfocal, skin is warm and dry, pulses are equal and symmetrical in her upper and lower extremities   EKG  Procedure date:  11/29/2009  Findings:      EKG shows normal sinus rhythm with a rate of 64 beats per minute, no significant ST or T wave changes. No prior study available for comparison.  Impression & Recommendations:  Problem # 1:  DYSPNEA (ICD-786.05) etiology of Ms. [name] shortness of breath is a I suspect you to bronchospasm. Her symptoms were relieved with albuterol. This has happened previously and her symptoms have been resolved with her inhaler. I have instructed her that if her symptoms of shortness of breath and chest tightness recurred, that she contact our office for additional workup including a possible stress test. As she was feeling well, she was reluctant to have any cardiac testing at this time. She did have a negative stress test in July of 2009. In the past she has seen Dr. Meredeth Ide and I have suggested that she follow up with him.  Her updated medication list for this problem includes:    Carvedilol 25 Mg Tabs (Carvedilol) .Marland Kitchen... Take one tablet by mouth twice a day    Benicar Hct 40-25 Mg Tabs (Olmesartan  medoxomil-hctz) .Marland Kitchen... 1 tab daily    Aspirin 81 Mg Tbec (Aspirin) .Marland Kitchen... Take one tablet by mouth daily    Hyzaar 100-25 Mg Tabs (Losartan potassium-hctz) .Marland Kitchen... 1 tab by mouth daily  Problem # 2:  PVD (ICD-443.9) Ms. Anne Hahn does have peripheral vascular disease with known moderate carotid arterial disease, known moderate disease of her lower extremity arteries. She does have claudication type symptoms. It has been sometime since she has had evaluation of her carotid and her lower extremity Dopplers and I have scheduled her for this in the next month.  Problem # 3:  HYPERLIPIDEMIA-MIXED (ICD-272.4) Patient has known peripheral vascular disease and needs aggressive lipid management. The zetia was stopped for uncertain reasons. she did have an episode of diverticulitis, constipation followed by diarrhea after a GI contrast bolus. . She is currently having no GI  issues and has been difficult to maintain on a statin due to myalgias. She is willing to retry the Zetia 10 mg daily. We will also retry Crestor 5 mg on an every other day basis. I have given her some samples and a coupon for a free-month of zetia.   Her updated medication list for this problem includes:    Crestor 5 Mg Tabs (Rosuvastatin calcium) .Marland Kitchen... Take one tablet by mouth daily.    Zetia 10 Mg Tabs (Ezetimibe) .Marland Kitchen... Take one tablet by mouth daily.  Other Orders: Carotid Duplex (Carotid Duplex) Arterial Duplex Lower Extremity (Arterial Duplex Low)  Patient Instructions: 1)  Your physician recommends that you schedule a follow-up appointment in: 6 months 2)  Your physician has recommended you make the following change in your medication: crestor 5 mg every other day, zetia 10 mg daily, once you are out of your benicar start losartan HCT 100/25 3)  Your physician has requested that you have a carotid duplex. This test is an ultrasound of the carotid arteries in your neck. It looks at blood flow through these arteries that supply the brain  with blood. Allow one hour for this exam. There are no restrictions or special instructions. 4)  Your physician has requested that you have a lower or upper extremity arterial duplex.  This test is an ultrasound of the arteries in the legs or arms.  It looks at arterial blood flow in the legs and arms.  Allow one hour for Lower and Upper Arterial scans. There are no restrictions or special instructions. Prescriptions: ZETIA 10 MG TABS (EZETIMIBE) Take one tablet by mouth daily.  #30 x 6   Entered by:   Charlena Cross, RN, BSN   Authorized by:   Dossie Arbour MD   Signed by:   Charlena Cross, RN, BSN on 11/29/2009   Method used:   [facility]ron[facility] to        [facility] [phone][facility] (retail)       [address]       [place], [place]  [address]       Ph: [phone]       Fax: [phone]   RxID:   [id number] CRESTOR 5 MG TABS (ROSUVASTATIN CALCIUM) Take one tablet by mouth daily.  #30 x 6   Entered by:   Charlena Cross, RN, BSN   Authorized by:   Dossie Arbour MD   Signed by:   Charlena Cross, RN, BSN on 11/29/2009   Method used:   Electronically to        [facility] [phone]* (retail)       [address]       [place], [place]  [address]       Ph: [phone]       Fax: [phone]   RxID:   [id number]

## 2010-11-23 NOTE — Progress Notes (Signed)
Summary: BP HIGH  Phone Note Call from Patient Call back at Home Phone [phone]   Caller: SELF Call For: [facility] Summary of Call: PT BP HAS BEEN RUNNING HIGH OVER THE WEEKEND Initial call taken by: Harlon Flor,  Mar 06, 2010 9:11 AM  Follow-up for Phone Call        Called spoke with pt.  Pt has been checking BPs this am 192/74.  Yesterday am 187/87 then midday 167/72 in pm 165/64. BP's have been consistantly running high.  Have remained elevated even with new BP meds added.  Pt is taking Benicar/HCT in am, Hydralazine tid, and taking clonidine 0.2mg  in am and pm and 0.1mg  in afternoon. Follow-up by: Cloyde Reams RN,  Mar 06, 2010 9:38 AM    Additional Follow-up for Phone Call Additional follow up Details #2::    Needs home nurse for medications, BP checking. Medication compliance may be an issue Increase hydralizine to 50 three times a day with as needed dose for SBP>160   Appended Document: BP HIGH Called spoke with pt advised of Dr Mariah Milling recomendations.  Emphasized importance to take daily maintance BP medications everyday as prescribed even if BP controlled.  EWJ

## 2010-12-06 ENCOUNTER — Ambulatory Visit: Payer: Self-pay | Admitting: Cardiovascular Disease

## 2010-12-13 ENCOUNTER — Ambulatory Visit (INDEPENDENT_AMBULATORY_CARE_PROVIDER_SITE_OTHER): Payer: Medicare Other | Admitting: Cardiovascular Disease

## 2010-12-13 ENCOUNTER — Encounter: Payer: Self-pay | Admitting: Cardiovascular Disease

## 2010-12-13 DIAGNOSIS — I10 Essential (primary) hypertension: Secondary | ICD-10-CM

## 2010-12-13 DIAGNOSIS — I739 Peripheral vascular disease, unspecified: Secondary | ICD-10-CM

## 2010-12-13 DIAGNOSIS — I2510 Atherosclerotic heart disease of native coronary artery without angina pectoris: Secondary | ICD-10-CM

## 2010-12-13 DIAGNOSIS — E119 Type 2 diabetes mellitus without complications: Secondary | ICD-10-CM

## 2010-12-15 ENCOUNTER — Encounter: Payer: Self-pay | Admitting: Cardiovascular Disease

## 2010-12-19 NOTE — Assessment & Plan Note (Signed)
Summary: Would like to discuss BP/AMD   Visit Type:  Follow-up Referring Provider:  gollan Primary Provider:  Dr. Randa Lynn  CC:  "Doing well"..  History of Present Illness: Catherine Holmes is a very pleasant 77 year old woman with a history of DM,  peripheral vascular disease, 60% carotid arterial disease as well as moderate arterial disease of the lower extremities, status post bilateral iliac stenting, moderate renal stenosis, hyperlipidemia, severe  hypertension with history of episodic shortness of breath and chest squeezing relieved with albuterol who presents for routine followup.  she reports that her blood pressure has been well controlled. She takes Benicar and hydralazine in the morning, clonidine at noon, clonidine and hydralazine in the early evening with bystolic 10 mg with clonidine and hydralazine before bed. This regiment has proved to be excellent with good blood pressure control.   Her hemoglobin A1c is 7.9. She has poor diet and eats significant carbohydrates. She feels that she needs more metformin and better diet.  carotid arterial ultrasound March 2011 shows 40-59% right internal carotid arterial disease, 60-79% left internal carotid arterial disease, this is unchanged from previous studies.  she has a history of statin intolerance with cramping in her hands even at low doses.  EKG shows normal sinus rhythm rate 77 beats per minute, no significant ST or T wave changes    Current Medications (verified): 1)  Bystolic 10 Mg Tabs (Nebivolol Hcl) .Marland Kitchen.. 1 Tab By Mouth Daily 2)  Benicar Hct 40-25 Mg Tabs (Olmesartan Medoxomil-Hctz) .Marland Kitchen.. 1 Tab Daily 3)  Metformin Hcl 500 Mg Tabs (Metformin Hcl) .Marland Kitchen.. 1 Tab By Mouth Daily 4)  Budeprion Sr 150 Mg Xr12h-Tab (Bupropion Hcl) .Marland Kitchen.. 1 Tab By Mouth Daily 5)  Dexilant 60 Mg Cpdr (Dexlansoprazole) .... Take 1 Tablet By Mouth Once A Day 6)  Aspirin 81 Mg Tbec (Aspirin) .... Take Two Tablets Once Daily 7)  Calcium-Vitamin D 600-200 Mg-Unit Tabs  (Calcium-Vitamin D) .... 2 Tab Daily 8)  Multivitamins  Tabs (Multiple Vitamin) .Marland Kitchen.. 1 Tab By Mouth Daily 9)  Spiriva Handihaler 18 Mcg Caps (Tiotropium Bromide Monohydrate) .Marland Kitchen.. 1 Puff Daily 10)  Proair Hfa 108 (90 Base) Mcg/act Aers (Albuterol Sulfate) .... As Needed 11)  Zetia 10 Mg Tabs (Ezetimibe) .... Take One Tablet By Mouth Daily. 12)  Hydralazine Hcl 50 Mg Tabs (Hydralazine Hcl) .... Take 1 Tablet By Mouth Four Times A Day 13)  Bystolic 5 Mg Tabs (Nebivolol Hcl) .... Take 1 Tablet Daily With The 10mg  Bystolic 14)  Clonidine Hcl 0.2 Mg Tabs (Clonidine Hcl) .... Take One Tablet By Mouth Three Times A Day 15)  Cardura 2 Mg Tabs (Doxazosin Mesylate) .... Take 1 Tablet By Mouth Once A Day 16)  Ranitidine Hcl 150 Mg Caps (Ranitidine Hcl) .... Once Daily 17)  Vitamin D 400 Unit  Tabs (Cholecalciferol) .... Once Daily 18)  Ferrous Sulfate 325 (65 Fe) Mg  Tabs (Ferrous Sulfate) .... 2 Once Daily 19)  Pravastatin Sodium 20 Mg Tabs (Pravastatin Sodium) .... 1/2 Tablet At Bedtime  Allergies (verified): 1)  ! Ceclor 2)  ! Erythromycin 3)  ! Septra 4)  ! * Tavist D 5)  ! Pcn 6)  ! Darvon 7)  ! Codeine 8)  ! * Lisinopril 9)  ! Zocor 10)  ! * Advair 11)  ! Doxycycline 12)  ! * Glipizide 13)  ! * Forteo 14)  ! Crestor 15)  ! Lipitor 16)  ! Levaquin  Past History:  Past Medical History: Last updated: 01/05/2010 HTN DM  COPD osteoporosis carotid disease TIC doulowreus  arthrolgias sjogrema syndrome TIA emphysema MVP CHF acid reflex diverticulitis  Past Surgical History: Last updated: Dec 28, 2009 hysterectomy gall bladder removed  Family History: Last updated: 12/28/2009 Father: Died at 31 heart attack Mother: Died at 29 heart attack Sister: Died at 34 heart attack  Social History: Last updated: 2009-12-28 Tobacco Use - Former. quit 10 years ago Alcohol Use - no Regular Exercise - no Drug Use - no  Risk Factors: Exercise: no (12/28/09)  Risk  Factors: Smoking Status: quit (12/28/09)  Review of Systems  The patient denies fever, weight loss, weight gain, vision loss, decreased hearing, hoarseness, chest pain, syncope, dyspnea on exertion, peripheral edema, prolonged cough, abdominal pain, incontinence, muscle weakness, depression, and enlarged lymph nodes.    Vital Signs:  Patient profile:   77 year old female Height:      63 inches Weight:      174 pounds BMI:     30.93 Pulse rate:   77 / minute BP sitting:   110 / 60  (left arm) Cuff size:   regular  Vitals Entered By: Bishop Dublin, CMA (December 13, 2010 2:23 PM)  Physical Exam  General:  Well developed, well nourished, in no acute distress. Head:  normocephalic and atraumatic Neck:  Neck supple, no JVD. No masses, thyromegaly or abnormal cervical nodes. Lungs:  Clear bilaterally to auscultation and percussion. Heart:  Non-displaced PMI, chest non-tender; regular rate and rhythm, S1, S2 with II/VI sem RSB,  no rubs or gallops. Carotid upstroke normal, no bruit. . Pedals normal pulses. No edema, no varicosities. Abdomen:  Bowel sounds positive; abdomen soft and non-tender without masses Msk:  Back normal, normal gait. Muscle strength and tone normal. Pulses:  pulses normal in all 4 extremities Extremities:  No clubbing or cyanosis. Neurologic:  Alert and oriented x 3. Skin:  Intact without lesions or rashes. Psych:  Normal affect.   Impression & Recommendations:  Problem # 1:  HYPERTENSION, BENIGN (ICD-401.1) she has had very difficult to manage blood pressure though over the past few visits, this has been well controlled on her current medication regimen. No further changes were made. We will try to cut back on her beta blocker to bystolic  10 mg a day.  Her updated medication list for this problem includes:    Bystolic 10 Mg Tabs (Nebivolol hcl) .Marland Kitchen... 1 tab by mouth daily    Benicar Hct 40-25 Mg Tabs (Olmesartan medoxomil-hctz) .Marland Kitchen... 1 tab daily     Aspirin 81 Mg Tbec (Aspirin) .Marland Kitchen... Take two tablets once daily    Hydralazine Hcl 50 Mg Tabs (Hydralazine hcl) .Marland Kitchen... Take 1 tablet by mouth four times a day    Bystolic 5 Mg Tabs (Nebivolol hcl) .Marland Kitchen... Take 1 tablet daily with the 10mg  bystolic    Clonidine Hcl 0.2 Mg Tabs (Clonidine hcl) .Marland Kitchen... Take one tablet by mouth three times a day    Cardura 2 Mg Tabs (Doxazosin mesylate) .Marland Kitchen... Take 1 tablet by mouth once a day  Problem # 2:  HYPERLIPIDEMIA-MIXED (ICD-272.4) We will try to obtain her most recent lipid panel for our records. Continue low-dose statin given her history of muscle ache, continue zetia.  Her updated medication list for this problem includes:    Zetia 10 Mg Tabs (Ezetimibe) .Marland Kitchen... Take one tablet by mouth daily.    Pravastatin Sodium 20 Mg Tabs (Pravastatin sodium) .Marland Kitchen... 1/2 tablet at bedtime  Problem # 3:  PVD (ICD-443.9) She does have significant peripheral vascular  disease. Repeat carotid scheduled in one month's time with lower extremity arterial given her history of common iliac arterial disease estimated at 50-70% in 2009.  Problem # 4:  CHF (ICD-428.0) no significant symptoms of CHF at this time. No significant lower extremity edema. Continue current medical management.  Her updated medication list for this problem includes:    Bystolic 10 Mg Tabs (Nebivolol hcl) .Marland Kitchen... 1 tab by mouth daily    Benicar Hct 40-25 Mg Tabs (Olmesartan medoxomil-hctz) .Marland Kitchen... 1 tab daily    Aspirin 81 Mg Tbec (Aspirin) .Marland Kitchen... Take two tablets once daily    Bystolic 5 Mg Tabs (Nebivolol hcl) .Marland Kitchen... Take 1 tablet daily with the 10mg  bystolic  Problem # 5:  COPD (EAV-409) breathing appears stable on her current inhaler regimen.  Her updated medication list for this problem includes:    Spiriva Handihaler 18 Mcg Caps (Tiotropium bromide monohydrate) .Marland Kitchen... 1 puff daily    Proair Hfa 108 (90 Base) Mcg/act Aers (Albuterol sulfate) .Marland Kitchen... As needed  Problem # 6:  DM (ICD-250.00) Her diabetes is  poorly controlled. She will likely need better diet, voiding carbohydrates. She may benefit from higher dose metformin.  Her updated medication list for this problem includes:    Benicar Hct 40-25 Mg Tabs (Olmesartan medoxomil-hctz) .Marland Kitchen... 1 tab daily    Metformin Hcl 500 Mg Tabs (Metformin hcl) .Marland Kitchen... 1 tab by mouth daily    Aspirin 81 Mg Tbec (Aspirin) .Marland Kitchen... Take two tablets once daily  Other Orders: Carotid Duplex (Carotid Duplex)  Patient Instructions: 1)  Your physician recommends that you schedule a follow-up appointment in: 6 months 2)  Your physician recommends that you continue on your current medications as directed. Please refer to the Current Medication list given to you today. 3)  TO BE SCHEDULED IN 1 MONTH:  Your physician has requested that you have a carotid duplex. This test is an ultrasound of the carotid arteries in your neck. It looks at blood flow through these arteries that supply the brain with blood. Allow one hour for this exam. There are no restrictions or special instructions.

## 2010-12-19 NOTE — Miscellaneous (Signed)
Summary: LE Arterial u/s  Clinical Lists Changes  Orders: Added new Test order of Arterial Duplex Lower Extremity (Arterial Duplex Low) - Signed

## 2010-12-30 ENCOUNTER — Encounter: Payer: Self-pay | Admitting: Cardiovascular Disease

## 2011-01-10 ENCOUNTER — Other Ambulatory Visit: Payer: Self-pay | Admitting: Cardiovascular Disease

## 2011-01-10 DIAGNOSIS — I739 Peripheral vascular disease, unspecified: Secondary | ICD-10-CM

## 2011-01-10 DIAGNOSIS — I6529 Occlusion and stenosis of unspecified carotid artery: Secondary | ICD-10-CM

## 2011-01-11 ENCOUNTER — Other Ambulatory Visit: Payer: Self-pay | Admitting: *Deleted

## 2011-01-11 ENCOUNTER — Encounter (INDEPENDENT_AMBULATORY_CARE_PROVIDER_SITE_OTHER): Payer: Medicare Other | Admitting: *Deleted

## 2011-01-11 DIAGNOSIS — I6529 Occlusion and stenosis of unspecified carotid artery: Secondary | ICD-10-CM

## 2011-01-11 DIAGNOSIS — I739 Peripheral vascular disease, unspecified: Secondary | ICD-10-CM

## 2011-01-18 ENCOUNTER — Telehealth: Payer: Self-pay | Admitting: Cardiovascular Disease

## 2011-01-18 NOTE — Telephone Encounter (Signed)
See below

## 2011-01-18 NOTE — Telephone Encounter (Signed)
Thank you for the info about Dr. Earnestine Leys.  Does she want [place] to send any records to Dr. Wyn Quaker of the recent ABIs on her legs?

## 2011-01-18 NOTE — Telephone Encounter (Signed)
Pt went to see Dr Kenard Gower yesterday and was told that Dr Corena Herter license has been revoked for surgery @ [facility] and [facility].  Pt states that Dr Kenard Gower told her that he had no business placing the stent in her arteries, that they were free of clots.  Pt wanted Dr Mariah Milling to know this information.

## 2011-01-22 NOTE — Telephone Encounter (Signed)
Spoke to pt, we will send her recent ABI reports to Dr. Wyn Quaker.

## 2011-01-24 ENCOUNTER — Encounter: Payer: Self-pay | Admitting: Cardiovascular Disease

## 2011-01-26 ENCOUNTER — Encounter: Payer: Self-pay | Admitting: Cardiovascular Disease

## 2011-02-05 ENCOUNTER — Encounter: Payer: Self-pay | Admitting: Cardiovascular Disease

## 2011-04-20 ENCOUNTER — Other Ambulatory Visit: Payer: Self-pay | Admitting: Cardiovascular Disease

## 2011-06-13 ENCOUNTER — Ambulatory Visit (INDEPENDENT_AMBULATORY_CARE_PROVIDER_SITE_OTHER): Payer: Medicare Other | Admitting: Cardiovascular Disease

## 2011-06-13 ENCOUNTER — Encounter: Payer: Self-pay | Admitting: Cardiovascular Disease

## 2011-06-13 DIAGNOSIS — R0602 Shortness of breath: Secondary | ICD-10-CM

## 2011-06-13 DIAGNOSIS — I6529 Occlusion and stenosis of unspecified carotid artery: Secondary | ICD-10-CM

## 2011-06-13 DIAGNOSIS — E119 Type 2 diabetes mellitus without complications: Secondary | ICD-10-CM

## 2011-06-13 DIAGNOSIS — I10 Essential (primary) hypertension: Secondary | ICD-10-CM

## 2011-06-13 DIAGNOSIS — I739 Peripheral vascular disease, unspecified: Secondary | ICD-10-CM

## 2011-06-13 NOTE — Assessment & Plan Note (Signed)
Previous notes indicate bilateral iliac artery stenting, noncritical renal artery stenosis. Again we will continue aggressive cholesterol management.

## 2011-06-13 NOTE — Patient Instructions (Addendum)
You are doing well. No medication changes were made. Please try red yeast rice for cholesterol.  Please call us if you have new issues that need to be addressed before your next appt.  We will call you for a follow up Appt. In 1 year

## 2011-06-13 NOTE — Progress Notes (Signed)
Patient ID: Catherine Holmes, female    DOB: [birth year], 77 y.o.   MRN: [id number]  HPI Comments: Ms. Schlesinger is a very pleasant 77 year old woman with a history of DM,  peripheral vascular disease, 60% carotid arterial disease as well as moderate arterial disease of the lower extremities, status post bilateral iliac stenting, moderate renal stenosis, hyperlipidemia, severe  hypertension with history of episodic shortness of breath and chest squeezing relieved with albuterol who presents for routine followup.  She states that it was recommended that she cut back on her clonidine dosing though with this regimen, her blood pressure climbed. She continues to follow her on regimen as detailed. She takes Benicar/hct and hydralazine in the morning, clonidine at noon, clonidine and hydralazine in the early evening with bystolic 10 mg with clonidine and hydralazine before bed. This regiment has proved to be excellent with good blood pressure control.     Her hemoglobin A1c is elevated, in the high7s and her metformin was recently increased.  She has poor diet and eats significant carbohydrates. She denies any significant shortness of breath or chest pain. She does have significant left hip discomfort.  carotid arterial ultrasound March 2012 shows 40-59% right internal carotid arterial disease, 60-79% left internal carotid arterial disease, this is unchanged from previous studies.   She is not able to tolerate statins. She does handle zetia without significant side effects. She has not tried red yeast rice.   EKG shows normal sinus rhythm rate 80 beats per minute, no significant ST or T wave changes      Outpatient Encounter Prescriptions as of 06/13/2011  Medication Sig Dispense Refill  . albuterol (PROAIR HFA) 108 (90 BASE) MCG/ACT inhaler Inhale 2 puffs into the lungs every 6 (six) hours as needed.        Marland Kitchen aspirin 81 MG tablet Take 81 mg by mouth daily.        Marland Kitchen buPROPion (WELLBUTRIN SR) 150 MG 12 hr  tablet Take 150 mg by mouth daily.        . Calcium Carbonate-Vitamin D (CALCIUM-VITAMIN D) 600-200 MG-UNIT CAPS Take 2 capsules by mouth daily.        . cholecalciferol (VITAMIN D-400) 400 UNITS TABS Take 400 Units by mouth daily.        . cloNIDine (CATAPRES) 0.2 MG tablet Take 0.2 mg by mouth 3 (three) times daily.        Marland Kitchen dexlansoprazole (DEXILANT) 60 MG capsule Take 60 mg by mouth daily.        Marland Kitchen doxazosin (CARDURA) 2 MG tablet TAKE 1 TABLET BY MOUTH ONCE A DAY  90 tablet  2  . ezetimibe (ZETIA) 10 MG tablet Take 10 mg by mouth daily.        . ferrous sulfate 325 (65 FE) MG tablet Take 2 tablets by mouth daily with breakfast.        . hydrALAZINE (APRESOLINE) 50 MG tablet Take 50 mg by mouth 4 (four) times daily.        . metFORMIN (GLUCOPHAGE) 500 MG tablet Take 500 mg by mouth 2 (two) times daily with a meal.       . Multiple Vitamin (MULTIVITAMIN) tablet Take 1 tablet by mouth daily.        . nebivolol (BYSTOLIC) 10 MG tablet Take 10 mg by mouth daily.        . nebivolol (BYSTOLIC) 5 MG tablet Take 5 mg by mouth daily. Take with bystolic 10mg        .  olmesartan-hydrochlorothiazide (BENICAR HCT) 40-25 MG per tablet Take 1 tablet by mouth daily.        Marland Kitchen tiotropium (SPIRIVA) 18 MCG inhalation capsule Place 18 mcg into inhaler and inhale daily.        . traMADol (ULTRAM) 50 MG tablet Take 50 mg by mouth every 6 (six) hours as needed.        . pravastatin (PRAVACHOL) 20 MG tablet Take 20 mg by mouth daily.           Review of Systems  Constitutional: Negative.   HENT: Negative.   Eyes: Negative.   Respiratory: Negative.   Cardiovascular: Negative.   Gastrointestinal: Negative.   Musculoskeletal: Positive for arthralgias and gait problem.  Skin: Negative.   Neurological: Negative.   Hematological: Negative.   Psychiatric/Behavioral: Negative.   All other systems reviewed and are negative.    BP 160/79  Pulse 77  Ht 5\' 1"  (1.549 m)  Wt 173 lb (78.472 kg)  BMI 32.69  kg/m2  Physical Exam  Nursing note and vitals reviewed. Constitutional: She is oriented to person, place, and time. She appears well-developed and well-nourished.  HENT:  Head: Normocephalic.  Nose: Nose normal.  Mouth/Throat: Oropharynx is clear and moist.  Eyes: Conjunctivae are normal. Pupils are equal, round, and reactive to light.  Neck: Normal range of motion. Neck supple. No JVD present. Carotid bruit is present.  Cardiovascular: Normal rate, regular rhythm, S1 normal, S2 normal and intact distal pulses.  Exam reveals no gallop and no friction rub.   Murmur heard.  Crescendo systolic murmur is present with a grade of 2/6  Pulmonary/Chest: Effort normal and breath sounds normal. No respiratory distress. She has no wheezes. She has no rales. She exhibits no tenderness.  Abdominal: Soft. Bowel sounds are normal. She exhibits no distension. There is no tenderness.  Musculoskeletal: Normal range of motion. She exhibits no edema and no tenderness.  Lymphadenopathy:    She has no cervical adenopathy.  Neurological: She is alert and oriented to person, place, and time. Coordination normal.  Skin: Skin is warm and dry. No rash noted. No erythema.  Psychiatric: She has a normal mood and affect. Her behavior is normal. Judgment and thought content normal.         Assessment and Plan

## 2011-06-13 NOTE — Assessment & Plan Note (Signed)
Underlying COPD. Breathing is improved on albuterol and nebulizers p.r.n..

## 2011-06-13 NOTE — Assessment & Plan Note (Signed)
She reports adequate blood pressure at home on her regimen. No further medication changes were made.Marland Kitchen

## 2011-06-13 NOTE — Assessment & Plan Note (Signed)
We have discussed her hemoglobin A1c with her and recommended dietary discretion. Metformin was recently increased.

## 2011-06-13 NOTE — Assessment & Plan Note (Signed)
Stable disease, worse on the left. We have recommended she continue her zetia, and add red yeast rice as she is unable to tolerate a statin. Repeat ultrasound early next year 2013.

## 2011-06-19 ENCOUNTER — Telehealth: Payer: Self-pay

## 2011-06-19 NOTE — Telephone Encounter (Signed)
Catherine Holmes has increased blood pressure today.  Her BP today has ran in the 200/84 range with heart rate of 76.  The patient was told since not had but one hydralazine tablet, to take another hydralazine.  She is to call the office in the am with readings and if over the night BP does not come down she is to go to ER.

## 2011-06-19 NOTE — Telephone Encounter (Signed)
Agree 

## 2011-06-20 NOTE — Telephone Encounter (Signed)
Forwarded to Sharon 

## 2011-07-12 ENCOUNTER — Telehealth: Payer: Self-pay | Admitting: *Deleted

## 2011-07-12 NOTE — Telephone Encounter (Signed)
Spoke to pt, she has called multiple times after conversation with BP problems and wants to be seen or know what medications she can take additionally. Told pt we have 2 MD's in clinic now that do not have any available times; if she is symptomatic with high BP she needs to go to ER or urgent care, and if regarding high BP only we will schedule f/u with Dr. Mariah Milling and notified her I will call her after Dr. Mariah Milling has reviewed note. Pt forwarded to scheduling for appt.

## 2011-07-12 NOTE — Telephone Encounter (Signed)
Pt called stating BP has still been elevated all week. Lowest SBP 164, but has been 187-210/70s-80s and she is "feeling bad and dizzy," HR has been 80s. She is going out of town this weekend and does not want to go with this BP so high and feeling so bad. Please advise as what to advise today. I have updated her BP meds on list as what she's taking now. She has taken all meds as instructed w/o any missed doses.

## 2011-07-12 NOTE — Telephone Encounter (Signed)
She has many options, We can increase hydrlazine to 100 QID (up from 50) We can change her clonidine po to a patch 0.3 x 2 patches weekly We could go up on bystolic to 20 mg daily

## 2011-07-12 NOTE — Telephone Encounter (Signed)
Reviewed options with pt and daughter, since pt is going out of town this weekend, they would like an appt if available. Pt scheduled tomorrow in clinic with Dr. Mariah Milling.

## 2011-07-13 ENCOUNTER — Ambulatory Visit (INDEPENDENT_AMBULATORY_CARE_PROVIDER_SITE_OTHER): Payer: Medicare Other | Admitting: Cardiovascular Disease

## 2011-07-13 ENCOUNTER — Encounter: Payer: Self-pay | Admitting: Cardiovascular Disease

## 2011-07-13 DIAGNOSIS — E119 Type 2 diabetes mellitus without complications: Secondary | ICD-10-CM

## 2011-07-13 DIAGNOSIS — I739 Peripheral vascular disease, unspecified: Secondary | ICD-10-CM

## 2011-07-13 DIAGNOSIS — E785 Hyperlipidemia, unspecified: Secondary | ICD-10-CM

## 2011-07-13 DIAGNOSIS — I2510 Atherosclerotic heart disease of native coronary artery without angina pectoris: Secondary | ICD-10-CM

## 2011-07-13 DIAGNOSIS — R0789 Other chest pain: Secondary | ICD-10-CM

## 2011-07-13 DIAGNOSIS — I10 Essential (primary) hypertension: Secondary | ICD-10-CM

## 2011-07-13 MED ORDER — NITROGLYCERIN 0.4 MG SL SUBL: 0.4000 mg | SUBLINGUAL_TABLET | SUBLINGUAL | Status: DC | PRN

## 2011-07-13 NOTE — Progress Notes (Signed)
Patient ID: Catherine Holmes, female    DOB: 05-Aug-1928, 77 y.o.   MRN: [id number]  HPI Comments:  pleasant 77 year old woman with a history of DM,  peripheral vascular disease, 60% carotid arterial disease as well as moderate arterial disease of the lower extremities, status post bilateral iliac stenting, moderate renal stenosis, hyperlipidemia, severe  hypertension with history of episodic shortness of breath and chest squeezing relieved with albuterol who presents for routine followup.  She continues to follow her on regimen as detailed. She takes Benicar/hct and hydralazine in the morning, clonidine at noon, clonidine and hydralazine in the early evening with bystolic 10 mg with clonidine and hydralazine before bed. Typically, her BP is well controlled. She has had a very stressful week. She has had problems with her dogs and interactions with her daughters dogs. They are not getting along and have been misbehaving. This has been very stressful for her. Her pressure have been running consitently in the 180 to 200 range. She did take an extra hydralazine today and her BP improved from 190 to 135. Her BP is highest predominnatly in the AM.      Her hemoglobin A1c is elevated, in the high7s and her metformin was  increased.  She has poor diet and eats significant carbohydrates.  She does have significant left hip discomfort.  She does report occassional chest tightness when her blood pressure is high. She also reports Experiencing a diaphoretic/hot type feeling when her blood pressure is high.  carotid arterial ultrasound March 2012 shows 40-59% right internal carotid arterial disease, 60-79% left internal carotid arterial disease, She is having Dr. Wyn Quaker follow her arterial disease.   She is not able to tolerate statins. She does handle zetia without significant side effects. We have previously encouraged red yeast rice.    EKG shows normal sinus rhythm rate 72 beats per minute, no significant ST or T  wave changes      Outpatient Encounter Prescriptions as of 07/13/2011  Medication Sig Dispense Refill  . albuterol (PROAIR HFA) 108 (90 BASE) MCG/ACT inhaler Inhale 2 puffs into the lungs every 6 (six) hours as needed.        Marland Kitchen aspirin 81 MG tablet Take 81 mg by mouth daily.        Marland Kitchen buPROPion (WELLBUTRIN SR) 150 MG 12 hr tablet Take 150 mg by mouth daily.        . Calcium Carbonate-Vitamin D (CALCIUM-VITAMIN D) 600-200 MG-UNIT CAPS Take 2 capsules by mouth daily.        . cholecalciferol (VITAMIN D-400) 400 UNITS TABS Take 400 Units by mouth daily.        . cloNIDine (CATAPRES) 0.2 MG tablet Take 0.2 mg by mouth 3 (three) times daily.        Marland Kitchen dexlansoprazole (DEXILANT) 60 MG capsule Take 60 mg by mouth daily.        Marland Kitchen doxazosin (CARDURA) 2 MG tablet TAKE 1 TABLET BY MOUTH ONCE A DAY  90 tablet  2  . ezetimibe (ZETIA) 10 MG tablet Take 10 mg by mouth daily.        . ferrous sulfate 325 (65 FE) MG tablet Take 2 tablets by mouth daily with breakfast.        . hydrALAZINE (APRESOLINE) 50 MG tablet Take 50 mg by mouth 4 (four) times daily.        . metFORMIN (GLUCOPHAGE) 500 MG tablet Take 500 mg by mouth 2 (two) times daily with a meal.       .  Multiple Vitamin (MULTIVITAMIN) tablet Take 1 tablet by mouth daily.        . nebivolol (BYSTOLIC) 10 MG tablet Take 15 mg by mouth daily.        Marland Kitchen olmesartan-hydrochlorothiazide (BENICAR HCT) 40-25 MG per tablet Take 1 tablet by mouth daily.        . pravastatin (PRAVACHOL) 20 MG tablet Take 20 mg by mouth daily.        Marland Kitchen tiotropium (SPIRIVA) 18 MCG inhalation capsule Place 18 mcg into inhaler and inhale daily.        . traMADol (ULTRAM) 50 MG tablet Take 50 mg by mouth every 6 (six) hours as needed.            Review of Systems  Constitutional: Negative.   HENT: Negative.   Eyes: Negative.   Respiratory: Negative.   Cardiovascular: Negative.        Tightness around her chest like a band, hot diaphoretic-type feeling  Gastrointestinal:  Negative.   Musculoskeletal: Positive for arthralgias and gait problem.  Skin: Negative.   Neurological: Negative.   Hematological: Negative.   Psychiatric/Behavioral: Negative.   All other systems reviewed and are negative.    BP 148/76  Pulse 72  Ht 5\' 1"  (1.549 m)  Wt 173 lb (78.472 kg)  BMI 32.69 kg/m2  Physical Exam  Nursing note and vitals reviewed. Constitutional: She is oriented to person, place, and time. She appears well-developed and well-nourished.  HENT:  Head: Normocephalic.  Nose: Nose normal.  Mouth/Throat: Oropharynx is clear and moist.  Eyes: Conjunctivae are normal. Pupils are equal, round, and reactive to light.  Neck: Normal range of motion. Neck supple. No JVD present. Carotid bruit is present.  Cardiovascular: Normal rate, regular rhythm, S1 normal, S2 normal and intact distal pulses.  Exam reveals no gallop and no friction rub.   Murmur heard.  Crescendo systolic murmur is present with a grade of 2/6  Pulmonary/Chest: Effort normal and breath sounds normal. No respiratory distress. She has no wheezes. She has no rales. She exhibits no tenderness.  Abdominal: Soft. Bowel sounds are normal. She exhibits no distension. There is no tenderness.  Musculoskeletal: Normal range of motion. She exhibits no edema and no tenderness.  Lymphadenopathy:    She has no cervical adenopathy.  Neurological: She is alert and oriented to person, place, and time. Coordination normal.  Skin: Skin is warm and dry. No rash noted. No erythema.  Psychiatric: She has a normal mood and affect. Her behavior is normal. Judgment and thought content normal.         Assessment and Plan

## 2011-07-13 NOTE — Assessment & Plan Note (Signed)
We have encouraged continued exercise, careful diet management in an effort to lose weight. 

## 2011-07-13 NOTE — Assessment & Plan Note (Signed)
Renal artery disease, carotid artery disease, lower extremity arterial disease. We'll continue aggressive medical management.

## 2011-07-13 NOTE — Assessment & Plan Note (Signed)
We have recommended she take red yeast rice with her zetia

## 2011-07-13 NOTE — Patient Instructions (Signed)
  Please take morning hydralazine 100 mg and benicar/hct at 6:30 AM Increase the hydralazine to 100 mg for blood pressure more than 160  Take bystolic 20 mg before bed Please call us if you have new issues that need to be addressed before your next appt.  We will call you for a follow up Appt. In 1 months

## 2011-07-13 NOTE — Assessment & Plan Note (Signed)
Blood pressure is a big issue this week. I suspect some of this is secondary to underlying stress concerning her dogs. We have suggested she take her morning pills at 6:00 when she first wakes up. We have also suggested she increase her hydralazine in the a.m. To 100 mg, and take the higher dose during the daytime for systolic pressure greater than 160. We will increase her bystolic to 20 mg in the p.m.. We did discuss using a clonidine patch but she would like to wait until she gets back from the beach next week.

## 2011-07-13 NOTE — Assessment & Plan Note (Signed)
We will give her nitroglycerin for chest tightness. She is at high risk of coronary artery disease and we have discussed this with her. If we are unable to alleviate her chest tightness with improved blood pressure control, we will recommend a stress Myoview.

## 2011-07-25 ENCOUNTER — Ambulatory Visit: Payer: Medicare Other | Admitting: Internal Medicine

## 2011-08-03 ENCOUNTER — Telehealth: Payer: Self-pay

## 2011-08-03 MED ORDER — NITROGLYCERIN 0.4 MG SL SUBL: 0.4000 mg | SUBLINGUAL_TABLET | SUBLINGUAL | Status: DC | PRN

## 2011-08-03 NOTE — Telephone Encounter (Signed)
Notified patient of instructions for blood pressure.

## 2011-08-03 NOTE — Telephone Encounter (Signed)
Blood pressure is running high again around 208/88 heart rate 71, 198/69 heart rate 67, 184/63 heart rate 65,193/75 69.  She left her NTG at the beach and needs some called to [facility] in [place]. She takes Benicar and two hydralazines takes in the am, clonidine 0.2 mg at noon, 6:00 takes one hydralazine and clonidine, bedtime @12 :00 midnight takes two bystolic tablets, one hydralazine, clonidine 0.2 mg and cardura.  Please advise what to do.

## 2011-08-03 NOTE — Telephone Encounter (Signed)
Please increase hydralazine to 100 mg TID Increase clonidine to 1 1/2 tabs (0.3 at least twice a day and PRN Consider clonidine patch 0.3 patch x 2 at a time , change once a week

## 2011-08-07 ENCOUNTER — Telehealth: Payer: Self-pay | Admitting: Cardiovascular Disease

## 2011-08-07 MED ORDER — CLONIDINE HCL 0.3 MG PO TABS: 0.3000 mg | ORAL_TABLET | Freq: Two times a day (BID) | ORAL | Status: DC

## 2011-08-07 NOTE — Telephone Encounter (Signed)
Called and left message yesterday regarding her blood pressures being in the 200s but did not hear back from anyone.  Patient followed instructions over the weekend with the increase of meds and it do not seem to help.  Would like a return call to discuss.

## 2011-08-07 NOTE — Telephone Encounter (Signed)
Spoke to pt this am, yesterday BP's at 0600 211//79 hr 73; 1pm 212/80 hr 71; 8pm 178/60 hr 70; 12 MN 216/79. Then this AM same, 10 am 204/75 hr 72. Pt explained what she took for BP and she is continuing to take clonidine 0.2mg  BID and only taking 1/2 PRN (not correct according to previous instructions.) After explaining to pt to take 1 1/2 BID always and if taking PRN also take 1 1/2 tablets (of the 0.2mg ). Pt verbalized understanding, and she knows that she can take extra 1 1/2 tab whenever her BP >150 systolic. I will change dose to 0.3mg  to help with confusion, and will also set pt up with [facility] for polypharmacy. Pt agreed to this. Pt instructed to call back in the meantime if she has any questions regarding how to take her meds. Also advised pt to try to go to sleep before 10am and not wake up just to take another BP med, explained that sleep may do more for her than waking up to take another pill. Pt states she will try this.

## 2011-08-08 ENCOUNTER — Encounter: Payer: Self-pay | Admitting: *Deleted

## 2011-08-16 ENCOUNTER — Other Ambulatory Visit: Payer: Self-pay | Admitting: Cardiovascular Disease

## 2011-08-28 ENCOUNTER — Telehealth: Payer: Self-pay | Admitting: *Deleted

## 2011-08-28 NOTE — Telephone Encounter (Signed)
[facility]N called stating pt was admitted to home health on 10/23 for polypharmacy, pt did not want to change the way she organized medications. They did get her a new pill box that she thinks helped, since then pt has not returned phone calls and will not answer the door. She does not seem to want home health anymore. They are discharging service at this time, and just called to inform us.

## 2011-09-08 DIAGNOSIS — R079 Chest pain, unspecified: Secondary | ICD-10-CM

## 2011-09-09 ENCOUNTER — Encounter: Payer: Self-pay | Admitting: Cardiovascular Disease

## 2011-09-10 ENCOUNTER — Inpatient Hospital Stay: Payer: Medicare Other | Admitting: Internal Medicine

## 2011-09-12 ENCOUNTER — Encounter: Payer: Self-pay | Admitting: Cardiovascular Disease

## 2011-09-12 ENCOUNTER — Ambulatory Visit (INDEPENDENT_AMBULATORY_CARE_PROVIDER_SITE_OTHER): Payer: Medicare Other | Admitting: Cardiovascular Disease

## 2011-09-12 ENCOUNTER — Ambulatory Visit: Payer: Medicare Other | Admitting: Cardiovascular Disease

## 2011-09-12 VITALS — BP 120/68 | HR 71 | Ht 60.0 in | Wt 173.2 lb

## 2011-09-12 DIAGNOSIS — R0602 Shortness of breath: Secondary | ICD-10-CM

## 2011-09-12 DIAGNOSIS — R0789 Other chest pain: Secondary | ICD-10-CM

## 2011-09-12 DIAGNOSIS — I10 Essential (primary) hypertension: Secondary | ICD-10-CM

## 2011-09-12 NOTE — Patient Instructions (Signed)
You are doing well. No medication changes were made. Monitor your blood pressure   Try RED YEAST RICE for cholesterol.  Please call us if you have new issues that need to be addressed before your next appt.  The office will contact you for a follow up Appt. In 1 months

## 2011-09-12 NOTE — Assessment & Plan Note (Signed)
I suspect that much of her symptoms of tightness in her chest is secondary to her lung disease. Symptoms typically improved with nebulizer treatment.

## 2011-09-12 NOTE — Assessment & Plan Note (Signed)
Blood pressure appears to be significantly improved on a clonidine patch 0.3 mg x2. She does take Benicar in the morning and bystolic with a small amount of hydralazine in the evening.

## 2011-09-12 NOTE — Assessment & Plan Note (Signed)
Recent workup in the hospital for chest pain including negative stress test. Chest tightness likely secondary to underlying lung disease.

## 2011-09-12 NOTE — Progress Notes (Signed)
Patient ID: Catherine Holmes, female    DOB: [birth year], 77 y.o.   MRN: [id number]  HPI Comments: 77 year old woman with a history of DM,  peripheral vascular disease, 60% carotid arterial disease as well as moderate arterial disease of the lower extremities, status post bilateral iliac stenting, moderate renal stenosis, hyperlipidemia, severe  hypertension with history of episodic shortness of breath and chest squeezing relieved with albuterol who presents for routine followup.  She was recently evaluated in the hospital for general malaise, severe hypertension, shortness of breath, chest pain. She had a stress test, lexiscan that showed no significant ischemia. She did appear to have bronchospasm during the study and had a nebulizer treatment with significant improvement of her symptoms. She was started on clonidine 0.3 mg patch and 2 days later a second patch was added for total of 0.6 mg. She was discharged on the patch x2 with hydralazine and bystolic.   Since her discharge, she reports that she has feltWell. She has used her nebulizer machine for shortness of breath with improvement of her symptoms. She is very impressed that her blood pressure has been much better controlled on the clonidine patch and has not needed nearly as many doses of hydralazine. She continues on the Benicar HCT in the morning, one hydralazine at nighttime with bystolic. She reports systolic pressures in the 120 range to 150 range.      In the past, Her hemoglobin A1c Has beenelevated, in the high 7s and her metformin was  increased.  She has poor diet and eats significant carbohydrates.  She does have significant left hip discomfort.  carotid arterial ultrasound March 2012 shows 40-59% right internal carotid arterial disease, 60-79% left internal carotid arterial disease, She is having Dr. Wyn Quaker follow her arterial disease.   She is not able to tolerate statins. She does handle zetia without significant side effects. We  have previously encouraged red yeast rice.    EKG shows normal sinus rhythm rate 71 beats per minute, no significant ST or T wave changes     Outpatient Encounter Prescriptions as of 09/12/2011  Medication Sig Dispense Refill  . albuterol (PROAIR HFA) 108 (90 BASE) MCG/ACT inhaler Inhale 2 puffs into the lungs every 6 (six) hours as needed.        Marland Kitchen aspirin 81 MG tablet Take 81 mg by mouth daily.        Marland Kitchen buPROPion (WELLBUTRIN SR) 150 MG 12 hr tablet Take 150 mg by mouth daily.        . Calcium Carbonate-Vitamin D (CALCIUM-VITAMIN D) 600-200 MG-UNIT CAPS Take 2 capsules by mouth daily.        . cholecalciferol (VITAMIN D-400) 400 UNITS TABS Take 400 Units by mouth daily.        . cloNIDine (CATAPRES - DOSED IN MG/24 HR) 0.3 mg/24hr Place 1 patch onto the skin once a week.        Marland Kitchen dexlansoprazole (DEXILANT) 60 MG capsule Take 60 mg by mouth daily.        Marland Kitchen doxazosin (CARDURA) 2 MG tablet TAKE 1 TABLET BY MOUTH ONCE A DAY  90 tablet  2  . ezetimibe (ZETIA) 10 MG tablet Take 10 mg by mouth daily.        . ferrous sulfate 325 (65 FE) MG tablet Take 2 tablets by mouth daily with breakfast.        . hydrALAZINE (APRESOLINE) 100 MG tablet Take 1 tablet (100 mg total) by mouth 3 (three) times  daily.  90 tablet  6  . metFORMIN (GLUCOPHAGE) 500 MG tablet Take 500 mg by mouth 2 (two) times daily with a meal.       . Multiple Vitamin (MULTIVITAMIN) tablet Take 1 tablet by mouth daily.        . Nebivolol HCl 20 MG TABS Take 1 tablet (20 mg total) by mouth daily.  30 tablet  6  . nitroGLYCERIN (NITROSTAT) 0.4 MG SL tablet Place 1 tablet (0.4 mg total) under the tongue every 5 (five) minutes as needed for chest pain.  25 tablet  11  . olmesartan-hydrochlorothiazide (BENICAR HCT) 40-25 MG per tablet Take 1 tablet by mouth daily.        Marland Kitchen tiotropium (SPIRIVA) 18 MCG inhalation capsule Place 18 mcg into inhaler and inhale daily.        . traMADol (ULTRAM) 50 MG tablet Take 50 mg by mouth every 6 (six) hours  as needed.            Review of Systems  Constitutional: Negative.   HENT: Negative.   Eyes: Negative.   Respiratory: Positive for shortness of breath.        Tightness around her chest like a band, hot diaphoretic-type feeling, improved with nebulizers  Cardiovascular: Negative.   Gastrointestinal: Negative.   Musculoskeletal: Positive for gait problem.  Skin: Negative.   Neurological: Negative.   Hematological: Negative.   Psychiatric/Behavioral: Negative.   All other systems reviewed and are negative.    BP 120/68  Pulse 71  Ht 5' (1.524 m)  Wt 173 lb 4 oz (78.586 kg)  BMI 33.84 kg/m2  Physical Exam  Nursing note and vitals reviewed. Constitutional: She is oriented to person, place, and time. She appears well-developed and well-nourished.  HENT:  Head: Normocephalic.  Nose: Nose normal.  Mouth/Throat: Oropharynx is clear and moist.  Eyes: Conjunctivae are normal. Pupils are equal, round, and reactive to light.  Neck: Normal range of motion. Neck supple. No JVD present. Carotid bruit is present.  Cardiovascular: Normal rate, regular rhythm, S1 normal, S2 normal and intact distal pulses.  Exam reveals no gallop and no friction rub.   Murmur heard.  Crescendo systolic murmur is present with a grade of 2/6  Pulmonary/Chest: Effort normal and breath sounds normal. No respiratory distress. She has no wheezes. She has no rales. She exhibits no tenderness.  Abdominal: Soft. Bowel sounds are normal. She exhibits no distension. There is no tenderness.  Musculoskeletal: Normal range of motion. She exhibits no edema and no tenderness.  Lymphadenopathy:    She has no cervical adenopathy.  Neurological: She is alert and oriented to person, place, and time. Coordination normal.  Skin: Skin is warm and dry. No rash noted. No erythema.  Psychiatric: She has a normal mood and affect. Her behavior is normal. Judgment and thought content normal.         Assessment and Plan

## 2011-09-18 ENCOUNTER — Encounter: Payer: Self-pay | Admitting: Cardiovascular Disease

## 2011-09-25 ENCOUNTER — Encounter: Payer: Self-pay | Admitting: Cardiovascular Disease

## 2011-09-27 ENCOUNTER — Encounter: Payer: Self-pay | Admitting: Cardiovascular Disease

## 2011-09-27 ENCOUNTER — Ambulatory Visit (INDEPENDENT_AMBULATORY_CARE_PROVIDER_SITE_OTHER): Payer: Medicare Other | Admitting: Cardiovascular Disease

## 2011-09-27 DIAGNOSIS — R079 Chest pain, unspecified: Secondary | ICD-10-CM

## 2011-09-27 DIAGNOSIS — J449 Chronic obstructive pulmonary disease, unspecified: Secondary | ICD-10-CM

## 2011-09-27 DIAGNOSIS — I10 Essential (primary) hypertension: Secondary | ICD-10-CM

## 2011-09-27 DIAGNOSIS — I059 Rheumatic mitral valve disease, unspecified: Secondary | ICD-10-CM

## 2011-09-27 DIAGNOSIS — R0602 Shortness of breath: Secondary | ICD-10-CM

## 2011-09-27 DIAGNOSIS — I6529 Occlusion and stenosis of unspecified carotid artery: Secondary | ICD-10-CM

## 2011-09-27 DIAGNOSIS — I341 Nonrheumatic mitral (valve) prolapse: Secondary | ICD-10-CM

## 2011-09-27 MED ORDER — CLONIDINE HCL 0.3 MG/24HR TD PTWK: 2.0000 | MEDICATED_PATCH | TRANSDERMAL | Status: DC

## 2011-09-27 MED ORDER — HYDRALAZINE HCL 50 MG PO TABS: 100.0000 mg | ORAL_TABLET | Freq: Three times a day (TID) | ORAL | Status: DC

## 2011-09-27 NOTE — Assessment & Plan Note (Signed)
I think a significant amount of her problems with breathing and chest tightness comes from her COPD. She is currently on nebulizers and takes inhalers. She feels she needs pulmonary function tests. Will refer her to [facility] for further management.

## 2011-09-27 NOTE — Progress Notes (Addendum)
Patient ID: Catherine Holmes, female    DOB: 1927-12-09, 77 y.o.   MRN: [id number]  HPI Comments: 77 year old woman with a history of DM,  peripheral vascular disease, 60% carotid arterial disease as well as moderate arterial disease of the lower extremities, status post bilateral iliac stenting, moderate renal stenosis, hyperlipidemia, severe  hypertension with history of episodic shortness of breath and chest squeezing relieved with albuterol/nebs who presents for routine followup.  She was recently evaluated in the hospital at [facility] for general malaise, severe hypertension, shortness of breath, chest pain. She had a stress test, lexiscan that showed no significant ischemia. She did appear to have bronchospasm during the study (same chest tightness) and had a nebulizer treatment with significant improvement of her symptoms. She was started on clonidine 0.3 mg patch x2 with hydralazine and bystolic, benicar/HCTZ.  She has used her nebulizer machine for shortness of breath with improvement of her symptoms.  Blood pressure on her last visit was in the 130 range. More recently, systolic pressures have been 150-160. She has had a hoarse voice for the past several days, complains of hearing her heart pulsation in her left ear. Also slightly worsening shortness of breath. She believes she needs a pulmonary function test. She was disappointed by previous visit to Dr. Meredeth Ide as he Only did a "half visit" to go to the hospital.  In the past, Her hemoglobin A1c Has been elevated, in the high 7s and her metformin was  increased.  She has poor diet and eats significant carbohydrates.  She does have significant left hip discomfort.  carotid arterial ultrasound March 2012 shows 40-59% right internal carotid arterial disease, 60-79% left internal carotid arterial disease, She is having Dr. Wyn Quaker follow her arterial disease.   She is not able to tolerate statins. She does handle zetia without significant side  effects. We have previously encouraged red yeast rice.    EKG shows normal sinus rhythm rate 68 beats per minute, no significant ST or T wave changes     Outpatient Encounter Prescriptions as of 09/27/2011  Medication Sig Dispense Refill  . albuterol (PROAIR HFA) 108 (90 BASE) MCG/ACT inhaler Inhale 2 puffs into the lungs every 6 (six) hours as needed.        Marland Kitchen aspirin 81 MG tablet Take 81 mg by mouth daily.        Marland Kitchen buPROPion (WELLBUTRIN SR) 150 MG 12 hr tablet Take 150 mg by mouth daily.        . Calcium Carbonate-Vitamin D (CALCIUM-VITAMIN D) 600-200 MG-UNIT CAPS Take 2 capsules by mouth daily.        . cholecalciferol (VITAMIN D-400) 400 UNITS TABS Take 400 Units by mouth daily.        . cloNIDine (CATAPRES - DOSED IN MG/24 HR) 0.3 mg/24hr Place 2 patches (0.6 mg total) onto the skin once a week.  24 patch  4  . dexlansoprazole (DEXILANT) 60 MG capsule Take 60 mg by mouth daily.        Marland Kitchen doxazosin (CARDURA) 2 MG tablet TAKE 1 TABLET BY MOUTH ONCE A DAY  90 tablet  2  . ezetimibe (ZETIA) 10 MG tablet Take 10 mg by mouth daily.        . hydrALAZINE (APRESOLINE) 50 MG tablet Take 2 tablets (100 mg total) by mouth 3 (three) times daily. Take two (50 mg) tablets three times a day.  540 tablet  4  . metFORMIN (GLUCOPHAGE) 500 MG tablet Take 500 mg by  mouth 2 (two) times daily with a meal.       . Multiple Vitamin (MULTIVITAMIN) tablet Take 1 tablet by mouth daily.        . Nebivolol HCl 20 MG TABS Take 1 tablet (20 mg total) by mouth daily.  30 tablet  6  . nitroGLYCERIN (NITROSTAT) 0.4 MG SL tablet Place 1 tablet (0.4 mg total) under the tongue every 5 (five) minutes as needed for chest pain.  25 tablet  11  . olmesartan-hydrochlorothiazide (BENICAR HCT) 40-25 MG per tablet Take 1 tablet by mouth daily.        Marland Kitchen tiotropium (SPIRIVA) 18 MCG inhalation capsule Place 18 mcg into inhaler and inhale daily.        . traMADol (ULTRAM) 50 MG tablet Take 50 mg by mouth every 6 (six) hours as needed.           Review of Systems  Constitutional: Negative.   HENT: Negative.        Hoarse  Voice x 2 days  Eyes: Negative.   Respiratory: Positive for shortness of breath.   Cardiovascular: Negative.   Gastrointestinal: Negative.   Musculoskeletal: Positive for gait problem.  Skin: Negative.   Neurological: Negative.   Hematological: Negative.   Psychiatric/Behavioral: Negative.   All other systems reviewed and are negative.    BP 130/72  Pulse 68  Ht 5\' 1"  (1.549 m)  Wt 172 lb 4 oz (78.132 kg)  BMI 32.55 kg/m2  Physical Exam  Nursing note and vitals reviewed. Constitutional: She is oriented to person, place, and time. She appears well-developed and well-nourished.  HENT:  Head: Normocephalic.  Nose: Nose normal.  Mouth/Throat: Oropharynx is clear and moist.  Eyes: Conjunctivae are normal. Pupils are equal, round, and reactive to light.  Neck: Normal range of motion. Neck supple. No JVD present. Carotid bruit is present.  Cardiovascular: Normal rate, regular rhythm, S1 normal, S2 normal and intact distal pulses.  Exam reveals no gallop and no friction rub.   Murmur heard.  Crescendo systolic murmur is present with a grade of 2/6  Pulmonary/Chest: Effort normal. No respiratory distress. She has decreased breath sounds. She has no wheezes. She has no rales. She exhibits no tenderness.  Abdominal: Soft. Bowel sounds are normal. She exhibits no distension. There is no tenderness.  Musculoskeletal: Normal range of motion. She exhibits no edema and no tenderness.  Lymphadenopathy:    She has no cervical adenopathy.  Neurological: She is alert and oriented to person, place, and time. Coordination normal.  Skin: Skin is warm and dry. No rash noted. No erythema.  Psychiatric: She has a normal mood and affect. Her behavior is normal. Judgment and thought content normal.         Assessment and Plan

## 2011-09-27 NOTE — Patient Instructions (Signed)
You are doing well. No medication changes were made. Please call us if you have new issues that need to be addressed before your next appt.  The office will contact you for a follow up Appt. In 3 months  

## 2011-09-27 NOTE — Assessment & Plan Note (Signed)
Stable disease. She has not been able to tolerate statins in the past. Repeat carotid ultrasound in 2013

## 2011-09-27 NOTE — Assessment & Plan Note (Signed)
Blood pressure continues to be better than previous visits. Clonidine patch x2 is likely helping. She is not interested in additional pills at this time though will take extra clonidine or hydralazine p.r.n. For systolic pressure greater than 170.

## 2011-10-03 ENCOUNTER — Institutional Professional Consult (permissible substitution): Payer: Medicare Other | Admitting: Pulmonary Disease

## 2011-10-03 ENCOUNTER — Encounter: Payer: Self-pay | Admitting: Pulmonary Disease

## 2011-10-03 ENCOUNTER — Ambulatory Visit (INDEPENDENT_AMBULATORY_CARE_PROVIDER_SITE_OTHER): Payer: Medicare Other | Admitting: Pulmonary Disease

## 2011-10-03 VITALS — BP 154/84 | HR 74 | Temp 98.0°F | Ht 61.0 in | Wt 171.4 lb

## 2011-10-03 DIAGNOSIS — J449 Chronic obstructive pulmonary disease, unspecified: Secondary | ICD-10-CM

## 2011-10-03 NOTE — Patient Instructions (Signed)
We will refer you to pulmonary rehab at [facility] Continue taking your medication as prescribed, be sure to use the spiriva once daily and the dulera twice a day no matter how you feel. We will see you back in 3 months.

## 2011-10-03 NOTE — Assessment & Plan Note (Addendum)
COPD: GOLD Stage III Combined recommendations from the [employer]	[place], [employer], Designer, television/film set, European Respiratory Society (Qaseem A et al, Ann Intern Med. 2011;155(3):179) recommends tobacco cessation, pulmonary rehab (for symptomatic patients with an FEV1 < 50% predicted), supplemental oxygen (for patients with SaO2 <88% or paO2 <55), and appropriate bronchodilator therapy.  In regards to long acting bronchodilators, they recommend monotherapy (FEV1 60-80% with symptoms weak evidence, FEV1 with symptoms <60% strong evidence), or combination therapy (FEV1 <60% with symptoms, strong recommendation, moderate evidence).  One should also provide patients with annual immunizations and consider therapy for prevention of COPD exacerbations (ie. roflumilast or azithromycin) when appopriate.  -O2 therapy: Not indicated -Immunizations: up to date -Tobacco use: not using -Exercise: will place pulmonary rehab referral to [facility] as she is profoundly deconditioned and could benefit from pulmonary rehab -Bronchodilator therapy: Spiriva, Dulera, prn albuterol.  I won't make any changes but she is not using her meds correctly.  We reviewed proper use and administration today. -Exacerbation prevention: not indicated

## 2011-10-03 NOTE — Progress Notes (Signed)
Subjective:    Patient ID: Catherine Holmes, female    DOB: [birth year], 77 y.o.   MRN: [id number]  HPI 77 y/o female who was diagnosed with COPD over 10 years ago presents to our clinic for evaluation and management of the same.  She states that she smoked for >60 years and quit over 10 years ago.  She has been hospitalized for shortness of breath over the years, but she says that doctors always attributed the hospitalization to her heart failure.  She was recently hospitalized at [facility] for what is described as a CHF exacerbation, but she feels this was related to her COPD.    She notes daily dyspnea, but rare cough.  She wheezes occasionally and uses her albuterol nebulizer anywhere between two and four times a day depending on how she feels.  She uses her Spiriva inhaler regularly, but often uses the dulera depending on how she feels.    Her major complaint is shortness of breath.  She gets short of breath with exertion, never really at rest.  She notes that she can only walk about 100 feet without getting short of breath.  She cannot make it through the grocery store without having to stop and rest.  Past Medical History  Diagnosis Date  . Hypertension   . Diabetes mellitus   . COPD (chronic obstructive pulmonary disease)   . Osteoporosis   . Carotid arterial disease   . Tic douloureux   . Arthralgia   . Sjoegren syndrome   . TIA (transient ischemic attack)   . Emphysema   . MVP (mitral valve prolapse)   . CHF (congestive heart failure)   . Acid reflux   . Diverticulitis      Family History  Problem Relation Age of Onset  . Heart attack Mother   . Heart attack Father   . Heart attack Sister      History   Social History  . Marital Status: Single    Spouse Name: N/A    Number of Children: 2  . Years of Education: N/A   Occupational History  . Retired     former IT sales professional   Social History Main Topics  . Smoking status: Former Smoker -- 0.5 packs/day for 50 years      Types: Cigarettes    Quit date: 10/22/2000  . Smokeless tobacco: Never Used  . Alcohol Use: No  . Drug Use: No  . Sexually Active: Not on file   Other Topics Concern  . Not on file   Social History Narrative  . No narrative on file     Allergies  Allergen Reactions  . Advair Hfa   . Atorvastatin   . Cefaclor   . Codeine   . Doxycycline   . Erythromycin   . Glipizide   . Levofloxacin   . Lisinopril   . Penicillins   . Propoxyphene Hcl   . Rosuvastatin   . Simvastatin   . Sulfamethoxazole W/Trimethoprim   . Teriparatide      Outpatient Prescriptions Prior to Visit  Medication Sig Dispense Refill  . albuterol (PROAIR HFA) 108 (90 BASE) MCG/ACT inhaler Inhale 2 puffs into the lungs every 6 (six) hours as needed.        Marland Kitchen aspirin 81 MG tablet Take 81 mg by mouth daily.        Marland Kitchen buPROPion (WELLBUTRIN SR) 150 MG 12 hr tablet Take 150 mg by mouth daily.        Marland Kitchen  Calcium Carbonate-Vitamin D (CALCIUM-VITAMIN D) 600-200 MG-UNIT CAPS Take 2 capsules by mouth daily.        . cholecalciferol (VITAMIN D-400) 400 UNITS TABS Take 400 Units by mouth daily.        . cloNIDine (CATAPRES - DOSED IN MG/24 HR) 0.3 mg/24hr Place 2 patches (0.6 mg total) onto the skin once a week.  24 patch  4  . dexlansoprazole (DEXILANT) 60 MG capsule Take 60 mg by mouth daily.        Marland Kitchen doxazosin (CARDURA) 2 MG tablet TAKE 1 TABLET BY MOUTH ONCE A DAY  90 tablet  2  . ezetimibe (ZETIA) 10 MG tablet Take 10 mg by mouth daily.        . hydrALAZINE (APRESOLINE) 50 MG tablet Take 2 tablets (100 mg total) by mouth 3 (three) times daily. Take two (50 mg) tablets three times a day.  540 tablet  4  . metFORMIN (GLUCOPHAGE) 500 MG tablet Take 500 mg by mouth 2 (two) times daily with a meal.       . Multiple Vitamin (MULTIVITAMIN) tablet Take 1 tablet by mouth daily.        . Nebivolol HCl 20 MG TABS Take 1 tablet (20 mg total) by mouth daily.  30 tablet  6  . nitroGLYCERIN (NITROSTAT) 0.4 MG SL tablet Place 1  tablet (0.4 mg total) under the tongue every 5 (five) minutes as needed for chest pain.  25 tablet  11  . olmesartan-hydrochlorothiazide (BENICAR HCT) 40-25 MG per tablet Take 1 tablet by mouth daily.        Marland Kitchen tiotropium (SPIRIVA) 18 MCG inhalation capsule Place 18 mcg into inhaler and inhale daily.        . traMADol (ULTRAM) 50 MG tablet Take 50 mg by mouth every 6 (six) hours as needed.             Review of Systems  Constitutional: Negative for fever, chills and unexpected weight change.  HENT: Positive for sore throat and trouble swallowing. Negative for ear pain, nosebleeds, congestion, rhinorrhea, sneezing, dental problem, voice change, postnasal drip and sinus pressure.   Eyes: Negative for visual disturbance.  Respiratory: Positive for shortness of breath. Negative for cough and choking.   Cardiovascular: Negative for chest pain and leg swelling.  Gastrointestinal: Positive for abdominal pain. Negative for vomiting and diarrhea.  Genitourinary: Negative for difficulty urinating.  Musculoskeletal: Negative for arthralgias.  Skin: Positive for rash.  Neurological: Negative for tremors, syncope and headaches.  Hematological: Bruises/bleeds easily.       Objective:   Physical Exam Filed Vitals:   10/03/11 1512  BP: 154/84  Pulse: 74  Temp: 98 F (36.7 C)  TempSrc: Oral  Height: 5\' 1"  (1.549 m)  Weight: 171 lb 6.4 oz (77.747 kg)  SpO2: 96%    Gen: chronically ill appearing,  no acute distress HEENT: NCAT, PERRL, EOMi, OP clear, neck supple without masses PULM: Prolonged exhalation, exp wheeze noted CV: RRR, no mgr, no JVD AB: BS+, soft, nontender, no hsm Ext: warm, trace edema, no clubbing, no cyanosis Derm: no rash or skin breakdown Neuro: A&Ox4, CN II-XII intact, strength 5/5 in all 4 extremities   10/03/11 In office spirometry: Ratio 41%, FEV1 0.71L (43% pred)      Assessment & Plan:   COPD COPD: GOLD Stage III Combined recommendations from the [employer], [employer], Designer, television/film set, European Respiratory Society (Qaseem A et al, Dewayne Hatch Intern  Med. 2011;155(3):179) recommends tobacco cessation, pulmonary rehab (for symptomatic patients with an FEV1 < 50% predicted), supplemental oxygen (for patients with SaO2 <88% or paO2 <55), and appropriate bronchodilator therapy.  In regards to long acting bronchodilators, they recommend monotherapy (FEV1 60-80% with symptoms weak evidence, FEV1 with symptoms <60% strong evidence), or combination therapy (FEV1 <60% with symptoms, strong recommendation, moderate evidence).  One should also provide patients with annual immunizations and consider therapy for prevention of COPD exacerbations (ie. roflumilast or azithromycin) when appopriate.  -O2 therapy: Not indicated -Immunizations: up to date -Tobacco use: not using -Exercise: will place pulmonary rehab referral to [facility] as she is profoundly deconditioned and could benefit from pulmonary rehab -Bronchodilator therapy: Spiriva, Dulera, prn albuterol.  I won't make any changes but she is not using her meds correctly.  We reviewed proper use and administration today. -Exacerbation prevention: not indicated   RTC 3-4 months  Updated Medication List Outpatient Encounter Prescriptions as of 10/03/2011  Medication Sig Dispense Refill  . albuterol (PROAIR HFA) 108 (90 BASE) MCG/ACT inhaler Inhale 2 puffs into the lungs every 6 (six) hours as needed.        Marland Kitchen albuterol (PROVENTIL) (2.5 MG/3ML) 0.083% nebulizer solution Take 2.5 mg by nebulization every 6 (six) hours as needed.        Marland Kitchen aspirin 81 MG tablet Take 81 mg by mouth daily.        Marland Kitchen buPROPion (WELLBUTRIN SR) 150 MG 12 hr tablet Take 150 mg by mouth daily.        . Calcium Carbonate-Vitamin D (CALCIUM-VITAMIN D) 600-200 MG-UNIT CAPS Take 2 capsules by mouth daily.        . cholecalciferol (VITAMIN D-400) 400 UNITS TABS Take 400 Units by mouth daily.          . cloNIDine (CATAPRES - DOSED IN MG/24 HR) 0.3 mg/24hr Place 2 patches (0.6 mg total) onto the skin once a week.  24 patch  4  . dexlansoprazole (DEXILANT) 60 MG capsule Take 60 mg by mouth daily.        Marland Kitchen doxazosin (CARDURA) 2 MG tablet TAKE 1 TABLET BY MOUTH ONCE A DAY  90 tablet  2  . DULERA 100-5 MCG/ACT AERO Inhale 2 puffs into the lungs Twice daily as needed.      . ezetimibe (ZETIA) 10 MG tablet Take 10 mg by mouth daily.        . hydrALAZINE (APRESOLINE) 50 MG tablet Take 2 tablets (100 mg total) by mouth 3 (three) times daily. Take two (50 mg) tablets three times a day.  540 tablet  4  . metFORMIN (GLUCOPHAGE) 500 MG tablet Take 500 mg by mouth 2 (two) times daily with a meal.       . Multiple Vitamin (MULTIVITAMIN) tablet Take 1 tablet by mouth daily.        . Nebivolol HCl 20 MG TABS Take 1 tablet (20 mg total) by mouth daily.  30 tablet  6  . nitroGLYCERIN (NITROSTAT) 0.4 MG SL tablet Place 1 tablet (0.4 mg total) under the tongue every 5 (five) minutes as needed for chest pain.  25 tablet  11  . olmesartan-hydrochlorothiazide (BENICAR HCT) 40-25 MG per tablet Take 1 tablet by mouth daily.        Marland Kitchen tiotropium (SPIRIVA) 18 MCG inhalation capsule Place 18 mcg into inhaler and inhale daily.        . traMADol (ULTRAM) 50 MG tablet Take 50 mg by mouth every 6 (six) hours  as needed.

## 2011-10-10 ENCOUNTER — Telehealth: Payer: Self-pay | Admitting: *Deleted

## 2011-10-10 NOTE — Telephone Encounter (Signed)
Noted  

## 2011-10-10 NOTE — Telephone Encounter (Signed)
Mildly high, but for her, looks great.

## 2011-10-10 NOTE — Telephone Encounter (Signed)
Pt dropped off BP numbers: 12/13 0800 145/64 68, 1900 169/69 75, 12/14 [place] 169/63 78, 0800 163/63 69, 1700 163/59 77, 12/15 MN 158/55 80, 0800 155/53 78, 1700 181/70 74, 12/16 MN 170/64 77, 0800 166/58 69, 1800 158/51 75, 12/17 MN 157/55 78, 0800 185/71 76, 1800 156/55 78, 12/18 153/53 73, 0800 177/61 70. Pt still taking clonidine 0.6mg  patch x 2/week, doxazosin 2mg  qd, hydralazine 100mg  TID, Benicar 40-25mg  qd, Bystolic 20mg  qd. Takes extra Clonidine for SBP >170. I told pt to continue same meds, will forward results to Dr. Mariah Milling.

## 2011-10-12 ENCOUNTER — Telehealth: Payer: Self-pay | Admitting: Pulmonary Disease

## 2011-10-12 NOTE — Telephone Encounter (Signed)
Received copies from [facility],on 12.21.12 . Forwarded 62 pages to Dr. Kendrick Fries ,for review.  sj

## 2011-12-10 ENCOUNTER — Other Ambulatory Visit: Payer: Self-pay | Admitting: Cardiovascular Disease

## 2011-12-26 ENCOUNTER — Encounter: Payer: Self-pay | Admitting: Pulmonary Disease

## 2011-12-26 ENCOUNTER — Ambulatory Visit (INDEPENDENT_AMBULATORY_CARE_PROVIDER_SITE_OTHER): Payer: Medicare Other | Admitting: Pulmonary Disease

## 2011-12-26 VITALS — BP 132/66 | HR 64 | Temp 98.0°F | Ht 61.0 in | Wt 170.4 lb

## 2011-12-26 DIAGNOSIS — J449 Chronic obstructive pulmonary disease, unspecified: Secondary | ICD-10-CM

## 2011-12-26 MED ORDER — ARFORMOTEROL TARTRATE 15 MCG/2ML IN NEBU: 15.0000 ug | INHALATION_SOLUTION | Freq: Two times a day (BID) | RESPIRATORY_TRACT | Status: DC

## 2011-12-26 MED ORDER — BUDESONIDE 0.25 MG/2ML IN SUSP: 0.2500 mg | Freq: Two times a day (BID) | RESPIRATORY_TRACT | Status: DC

## 2011-12-26 NOTE — Patient Instructions (Signed)
Stop dulera. Continue spiriva one puff once a day. Start brovana through the nebulizer twice a day. Start pulmicort through the nebulizer twice a day. You only need to use the albuterol (proAir or nebulizer) as needed.  Let us know if you are not getting better after starting these.  We will se you back in 3-4 months or sooner if needed.

## 2011-12-26 NOTE — Progress Notes (Signed)
Subjective:    Patient ID: Catherine Holmes, female    DOB: [birth year], 78 y.o.   MRN: [id number]  Synopsis: Catherine Holmes has GOLD stage III COPD and was referred to [place] in 09/2011 for evaluation of the same.  She was a prior heavy smoker who quit in the 1990's.  She has been hospitalized multiple times.  In our first visit we recommended pulmonary rehab.    HPI  12/26/11 ROV -- Catherine Holmes states that she has been more short of breath on exertion only in the last three weeks.  She has had no change in cough, no sputum production.  She has had a chronic, dry cough at night that only lasts a few minutes and is due to dry mouth. She has not been exercising much because of pain in her left knee and hip.  She does not have chest pain or swelling in her legs.  She notes some soreness in her back when she gets short of breath.  Past Medical History  Diagnosis Date  . Hypertension   . Diabetes mellitus   . COPD (chronic obstructive pulmonary disease)   . Osteoporosis   . Carotid arterial disease   . Tic douloureux   . Arthralgia   . Sjoegren syndrome   . TIA (transient ischemic attack)   . Emphysema   . MVP (mitral valve prolapse)   . CHF (congestive heart failure)   . Acid reflux   . Diverticulitis      Allergies  Allergen Reactions  . Advair Hfa   . Atorvastatin   . Cefaclor   . Codeine   . Doxycycline   . Erythromycin   . Glipizide   . Levofloxacin   . Lisinopril   . Penicillins   . Propoxyphene Hcl   . Rosuvastatin   . Simvastatin   . Sulfamethoxazole W/Trimethoprim   . Teriparatide      Outpatient Prescriptions Prior to Visit  Medication Sig Dispense Refill  . albuterol (PROAIR HFA) 108 (90 BASE) MCG/ACT inhaler Inhale 2 puffs into the lungs every 6 (six) hours as needed.        Marland Kitchen albuterol (PROVENTIL) (2.5 MG/3ML) 0.083% nebulizer solution Take 2.5 mg by nebulization every 6 (six) hours as needed.        Marland Kitchen aspirin 81 MG tablet Take 81 mg by mouth daily.        Marland Kitchen buPROPion  (WELLBUTRIN SR) 150 MG 12 hr tablet Take 150 mg by mouth daily.        . Calcium Carbonate-Vitamin D (CALCIUM-VITAMIN D) 600-200 MG-UNIT CAPS Take 2 capsules by mouth daily.        . cholecalciferol (VITAMIN D-400) 400 UNITS TABS Take 400 Units by mouth daily.        . cloNIDine (CATAPRES - DOSED IN MG/24 HR) 0.3 mg/24hr Place 2 patches (0.6 mg total) onto the skin once a week.  24 patch  4  . dexlansoprazole (DEXILANT) 60 MG capsule Take 60 mg by mouth daily.        Marland Kitchen doxazosin (CARDURA) 2 MG tablet TAKE 1 TABLET BY MOUTH ONCE A DAY  90 tablet  1  . DULERA 100-5 MCG/ACT AERO Inhale 2 puffs into the lungs Twice daily as needed.      . ezetimibe (ZETIA) 10 MG tablet Take 10 mg by mouth daily.        . hydrALAZINE (APRESOLINE) 50 MG tablet Take 2 tablets (100 mg total) by mouth 3 (three) times daily.  Take two (50 mg) tablets three times a day.  540 tablet  4  . metFORMIN (GLUCOPHAGE) 500 MG tablet Take 500 mg by mouth 2 (two) times daily with a meal.       . Multiple Vitamin (MULTIVITAMIN) tablet Take 1 tablet by mouth daily.        . Nebivolol HCl 20 MG TABS Take 1 tablet (20 mg total) by mouth daily.  30 tablet  6  . nitroGLYCERIN (NITROSTAT) 0.4 MG SL tablet Place 1 tablet (0.4 mg total) under the tongue every 5 (five) minutes as needed for chest pain.  25 tablet  11  . olmesartan-hydrochlorothiazide (BENICAR HCT) 40-25 MG per tablet Take 1 tablet by mouth daily.        Marland Kitchen tiotropium (SPIRIVA) 18 MCG inhalation capsule Place 18 mcg into inhaler and inhale daily.        . traMADol (ULTRAM) 50 MG tablet Take 50 mg by mouth every 6 (six) hours as needed.          Review of Systems  Constitutional: Negative for fever, chills and unexpected weight change.  HENT: Negative for congestion, rhinorrhea and postnasal drip.   Eyes: Negative for visual disturbance.  Respiratory: Positive for cough and shortness of breath. Negative for choking.   Cardiovascular: Negative for chest pain and leg swelling.        Objective:   Physical Exam  Filed Vitals:   12/26/11 1527  BP: 132/66  Pulse: 64  Temp: 98 F (36.7 C)  TempSrc: Oral  Height: 5\' 1"  (1.549 m)  Weight: 170 lb 6.4 oz (77.293 kg)  SpO2: 96%    Gen: chronically ill appearing,  no acute distress HEENT: NCAT, PERRL, EOMi, OP clear, neck supple without masses PULM: Prolonged exhalation, exp wheeze noted CV: RRR, no mgr, no JVD AB: BS+, soft, nontender, no hsm Ext: warm, trace edema, no clubbing, no cyanosis Derm: no rash or skin breakdown Neuro: A&Ox4, CN II-XII intact, strength 5/5 in all 4 extremities   10/03/11 In office spirometry: Ratio 41%, FEV1 0.71L (43% pred)      Assessment & Plan:   COPD Catherine Holmes notes increasing shortness of breath and dyspnea on exertion in the last few weeks, but does not have evidence on exam of volume overload, a respiratory infection, or worsening hypoxemia.  I think that her dyspnea is due in large part to her COPD and deconditioning.    We reviewed her inhaler technique at length because I think a big part of the problem is that she is not administering the [facility] correctly.  Despite our efforts, I think that we need to change her over to nebulized long acting bronchodilators and inhaled steroids as compliance with these will be much easier.  Plan: -I encouraged that she attend pulmonary rehab at length -stop dulera -start brovana nebulized bid -start pulmicort nebulized bid -albuterol technique was reviewed -continue tiotropium daily   RTC 3-4 months  Updated Medication List Outpatient Encounter Prescriptions as of 12/26/2011  Medication Sig Dispense Refill  . albuterol (PROAIR HFA) 108 (90 BASE) MCG/ACT inhaler Inhale 2 puffs into the lungs every 6 (six) hours as needed.        Marland Kitchen albuterol (PROVENTIL) (2.5 MG/3ML) 0.083% nebulizer solution Take 2.5 mg by nebulization every 6 (six) hours as needed.        Marland Kitchen aspirin 81 MG tablet Take 81 mg by mouth daily.        Marland Kitchen buPROPion  (WELLBUTRIN SR) 150 MG  12 hr tablet Take 150 mg by mouth daily.        . Calcium Carbonate-Vitamin D (CALCIUM-VITAMIN D) 600-200 MG-UNIT CAPS Take 2 capsules by mouth daily.        . cholecalciferol (VITAMIN D-400) 400 UNITS TABS Take 400 Units by mouth daily.        . cloNIDine (CATAPRES - DOSED IN MG/24 HR) 0.3 mg/24hr Place 2 patches (0.6 mg total) onto the skin once a week.  24 patch  4  . dexlansoprazole (DEXILANT) 60 MG capsule Take 60 mg by mouth daily.        Marland Kitchen doxazosin (CARDURA) 2 MG tablet TAKE 1 TABLET BY MOUTH ONCE A DAY  90 tablet  1  . DULERA 100-5 MCG/ACT AERO Inhale 2 puffs into the lungs Twice daily as needed.      . ezetimibe (ZETIA) 10 MG tablet Take 10 mg by mouth daily.        . hydrALAZINE (APRESOLINE) 50 MG tablet Take 2 tablets (100 mg total) by mouth 3 (three) times daily. Take two (50 mg) tablets three times a day.  540 tablet  4  . metFORMIN (GLUCOPHAGE) 500 MG tablet Take 500 mg by mouth 2 (two) times daily with a meal.       . Multiple Vitamin (MULTIVITAMIN) tablet Take 1 tablet by mouth daily.        . Nebivolol HCl 20 MG TABS Take 1 tablet (20 mg total) by mouth daily.  30 tablet  6  . nitroGLYCERIN (NITROSTAT) 0.4 MG SL tablet Place 1 tablet (0.4 mg total) under the tongue every 5 (five) minutes as needed for chest pain.  25 tablet  11  . olmesartan-hydrochlorothiazide (BENICAR HCT) 40-25 MG per tablet Take 1 tablet by mouth daily.        Marland Kitchen tiotropium (SPIRIVA) 18 MCG inhalation capsule Place 18 mcg into inhaler and inhale daily.        . traMADol (ULTRAM) 50 MG tablet Take 50 mg by mouth every 6 (six) hours as needed.

## 2011-12-26 NOTE — Assessment & Plan Note (Signed)
Catherine Holmes notes increasing shortness of breath and dyspnea on exertion in the last few weeks, but does not have evidence on exam of volume overload, a respiratory infection, or worsening hypoxemia.  I think that her dyspnea is due in large part to her COPD and deconditioning.    We reviewed her inhaler technique at length because I think a big part of the problem is that she is not administering the [facility] correctly.  Despite our efforts, I think that we need to change her over to nebulized long acting bronchodilators and inhaled steroids as compliance with these will be much easier.  Plan: -I encouraged that she attend pulmonary rehab at length -stop dulera -start brovana nebulized bid -start pulmicort nebulized bid -albuterol technique was reviewed -continue tiotropium daily

## 2011-12-31 ENCOUNTER — Telehealth: Payer: Self-pay | Admitting: Pulmonary Disease

## 2011-12-31 NOTE — Telephone Encounter (Signed)
Can we make arrangements for nebulized formoterol through an agency like [facility]?

## 2011-12-31 NOTE — Telephone Encounter (Signed)
Contacted patient, patient states unable to afford Brovana and needs something else sent in. Please advise. Thanks

## 2012-01-01 NOTE — Telephone Encounter (Signed)
LMOM for pt TCB 

## 2012-01-01 NOTE — Telephone Encounter (Signed)
Returning call can be reached at [phone].Catherine Holmes

## 2012-01-02 MED ORDER — FORMOTEROL FUMARATE 20 MCG/2ML IN NEBU: INHALATION_SOLUTION | RESPIRATORY_TRACT | Status: DC

## 2012-01-02 NOTE — Telephone Encounter (Signed)
Spoke with pt. She states that she is okay with trying formoterol, and okay with sending this to [facility]. I have printed rx and faxed to [facility]. Pt to call back with any issues.

## 2012-01-04 ENCOUNTER — Telehealth: Payer: Self-pay | Admitting: Cardiovascular Disease

## 2012-01-04 NOTE — Telephone Encounter (Signed)
Advised pt's daughter that pt should and needs to be evaluated in ED.  Pt agrees.

## 2012-01-04 NOTE — Telephone Encounter (Signed)
Pt daughter called stating that pt is passing out and was unresponsive for 5-10 mins until EMS arrived. Wanted to see if Dr Mariah Milling could see her or what she needed to do. I instructed her to take pt to the ED and she refused stated they would keep her appt on Tuesday with [place].

## 2012-01-07 ENCOUNTER — Inpatient Hospital Stay: Payer: Self-pay | Admitting: Internal Medicine

## 2012-01-07 ENCOUNTER — Telehealth: Payer: Self-pay | Admitting: Cardiovascular Disease

## 2012-01-07 LAB — URINALYSIS, COMPLETE
Bacteria: NONE SEEN
Bilirubin,UR: NEGATIVE
Blood: NEGATIVE
Glucose,UR: NEGATIVE mg/dL
Ketone: NEGATIVE
Leukocyte Esterase: NEGATIVE
Nitrite: NEGATIVE
Ph: 7
Protein: NEGATIVE
RBC,UR: NONE SEEN /HPF
Specific Gravity: 1.006
Squamous Epithelial: <1
WBC UR: <1 /HPF

## 2012-01-07 LAB — CBC
HCT: 26.7 % — ABNORMAL LOW (ref 35.0–47.0)
MCH: 23.2 pg — ABNORMAL LOW (ref 26.0–34.0)
MCV: 73 fL — ABNORMAL LOW (ref 80–100)
Platelet: 441 10*3/uL — ABNORMAL HIGH (ref 150–440)
RBC: 3.64 10*6/uL — ABNORMAL LOW (ref 3.80–5.20)
RDW: 18.5 % — ABNORMAL HIGH (ref 11.5–14.5)

## 2012-01-07 LAB — BASIC METABOLIC PANEL WITH GFR
Anion Gap: 9
BUN: 8 mg/dL
Calcium, Total: 8.6 mg/dL
Chloride: 94 mmol/L — ABNORMAL LOW
Co2: 31 mmol/L
Creatinine: 0.63 mg/dL
EGFR (African American): >60
EGFR (Non-African Amer.): >60
Glucose: 122 mg/dL — ABNORMAL HIGH
Osmolality: 268
Potassium: 3.4 mmol/L — ABNORMAL LOW
Sodium: 134 mmol/L — ABNORMAL LOW

## 2012-01-07 LAB — PRO B NATRIURETIC PEPTIDE: B-Type Natriuretic Peptide: 3008 pg/mL — ABNORMAL HIGH

## 2012-01-07 LAB — TROPONIN I: Troponin-I: <0.02 ng/mL

## 2012-01-07 NOTE — Telephone Encounter (Signed)
Pt daughter calling states that pt is having chest pain, facial numbness and drawning.

## 2012-01-07 NOTE — Telephone Encounter (Signed)
Advised pt to go to ED.

## 2012-01-08 ENCOUNTER — Ambulatory Visit: Payer: Medicare Other | Admitting: Cardiovascular Disease

## 2012-01-08 DIAGNOSIS — R5383 Other fatigue: Secondary | ICD-10-CM

## 2012-01-08 DIAGNOSIS — I319 Disease of pericardium, unspecified: Secondary | ICD-10-CM

## 2012-01-08 DIAGNOSIS — R609 Edema, unspecified: Secondary | ICD-10-CM

## 2012-01-08 DIAGNOSIS — R0602 Shortness of breath: Secondary | ICD-10-CM

## 2012-01-08 DIAGNOSIS — R5381 Other malaise: Secondary | ICD-10-CM

## 2012-01-08 LAB — CBC WITH DIFFERENTIAL/PLATELET
Basophil %: 0.1 %
Eosinophil %: 1.2 %
HGB: 9 g/dL — ABNORMAL LOW (ref 12.0–16.0)
Lymphocyte #: 1.6 10*3/uL (ref 1.0–3.6)
Lymphocyte %: 15.2 %
MCH: 23.1 pg — ABNORMAL LOW (ref 26.0–34.0)
MCV: 73 fL — ABNORMAL LOW (ref 80–100)
Monocyte #: 0.9 10*3/uL — ABNORMAL HIGH (ref 0.0–0.7)
Neutrophil %: 74.7 %
RBC: 3.91 10*6/uL (ref 3.80–5.20)

## 2012-01-08 LAB — BASIC METABOLIC PANEL
BUN: 8 mg/dL (ref 7–18)
Calcium, Total: 9.1 mg/dL (ref 8.5–10.1)
Chloride: 93 mmol/L — ABNORMAL LOW (ref 98–107)
Creatinine: 0.74 mg/dL (ref 0.60–1.30)
Potassium: 3.2 mmol/L — ABNORMAL LOW (ref 3.5–5.1)
Sodium: 136 mmol/L (ref 136–145)

## 2012-01-08 LAB — TROPONIN I: Troponin-I: <0.02 ng/mL

## 2012-01-08 LAB — MAGNESIUM: Magnesium: 0.9 mg/dL — ABNORMAL LOW

## 2012-01-08 LAB — CK TOTAL AND CKMB (NOT AT ARMC): CK-MB: 2.4 ng/mL (ref 0.5–3.6)

## 2012-01-09 DIAGNOSIS — I5031 Acute diastolic (congestive) heart failure: Secondary | ICD-10-CM

## 2012-01-09 DIAGNOSIS — R55 Syncope and collapse: Secondary | ICD-10-CM

## 2012-01-09 LAB — MAGNESIUM: Magnesium: 2.1 mg/dL

## 2012-01-09 LAB — POTASSIUM: Potassium: 4.2 mmol/L (ref 3.5–5.1)

## 2012-01-24 ENCOUNTER — Encounter: Payer: Medicare Other | Admitting: Cardiovascular Disease

## 2012-01-24 ENCOUNTER — Encounter: Payer: Self-pay | Admitting: Cardiovascular Disease

## 2012-01-24 ENCOUNTER — Ambulatory Visit (INDEPENDENT_AMBULATORY_CARE_PROVIDER_SITE_OTHER): Payer: Medicare Other | Admitting: Cardiovascular Disease

## 2012-01-24 VITALS — BP 124/60 | HR 68 | Ht 65.0 in | Wt 171.0 lb

## 2012-01-24 DIAGNOSIS — E785 Hyperlipidemia, unspecified: Secondary | ICD-10-CM

## 2012-01-24 DIAGNOSIS — R0602 Shortness of breath: Secondary | ICD-10-CM

## 2012-01-24 DIAGNOSIS — I6529 Occlusion and stenosis of unspecified carotid artery: Secondary | ICD-10-CM

## 2012-01-24 DIAGNOSIS — I509 Heart failure, unspecified: Secondary | ICD-10-CM

## 2012-01-24 DIAGNOSIS — I10 Essential (primary) hypertension: Secondary | ICD-10-CM

## 2012-01-24 DIAGNOSIS — R42 Dizziness and giddiness: Secondary | ICD-10-CM

## 2012-01-24 DIAGNOSIS — E119 Type 2 diabetes mellitus without complications: Secondary | ICD-10-CM

## 2012-01-24 DIAGNOSIS — J449 Chronic obstructive pulmonary disease, unspecified: Secondary | ICD-10-CM

## 2012-01-24 MED ORDER — FUROSEMIDE 20 MG PO TABS: 20.0000 mg | ORAL_TABLET | Freq: Every day | ORAL | Status: DC | PRN

## 2012-01-24 MED ORDER — EZETIMIBE 10 MG PO TABS: 10.0000 mg | ORAL_TABLET | Freq: Every day | ORAL | Status: DC

## 2012-01-24 MED ORDER — NEBIVOLOL HCL 10 MG PO TABS: ORAL_TABLET | ORAL | Status: DC

## 2012-01-24 MED ORDER — TIOTROPIUM BROMIDE MONOHYDRATE 18 MCG IN CAPS: 18.0000 ug | ORAL_CAPSULE | Freq: Every day | RESPIRATORY_TRACT | Status: DC

## 2012-01-24 MED ORDER — TRIAMCINOLONE ACETONIDE 0.1 % EX CREA: 1.0000 "application " | TOPICAL_CREAM | Freq: Two times a day (BID) | CUTANEOUS | Status: DC

## 2012-01-24 MED ORDER — ALBUTEROL SULFATE HFA 108 (90 BASE) MCG/ACT IN AERS: 2.0000 | INHALATION_SPRAY | Freq: Four times a day (QID) | RESPIRATORY_TRACT | Status: DC | PRN

## 2012-01-24 MED ORDER — OLMESARTAN MEDOXOMIL-HCTZ 40-25 MG PO TABS: 1.0000 | ORAL_TABLET | Freq: Every day | ORAL | Status: DC

## 2012-01-24 MED ORDER — DEXLANSOPRAZOLE 60 MG PO CPDR: 60.0000 mg | DELAYED_RELEASE_CAPSULE | Freq: Every day | ORAL | Status: DC

## 2012-01-24 NOTE — Assessment & Plan Note (Signed)
She has followup with Dr. Wyn Quaker.

## 2012-01-24 NOTE — Assessment & Plan Note (Signed)
We have encouraged continued exercise, careful diet management in an effort to lose weight. 

## 2012-01-24 NOTE — Assessment & Plan Note (Signed)
Recent admission for diastolic CHF in the setting of significant anemia. She is now on high iron diet. She did not tolerate iron pills. We have prescribed Lasix to take when necessary with banana and foods high in potassium. Trace edema with only mild shortness of breath which is her baseline

## 2012-01-24 NOTE — Assessment & Plan Note (Signed)
Adequate blood pressure on today's visit. Blood pressure at home is typically 150 systolic. She has been cutting back on her hydralazine and clonidine to avoid low blood pressure.

## 2012-01-24 NOTE — Assessment & Plan Note (Signed)
Continue zetia. We have encouraged her to take red yeast rice. Goal LDL <70

## 2012-01-24 NOTE — Progress Notes (Signed)
Patient ID: Catherine Holmes, female    DOB: [birth year], 78 y.o.   MRN: [id number]  HPI Comments: 78 year old woman with a history of DM,  peripheral vascular disease, 60% carotid arterial disease as well as moderate arterial disease of the lower extremities, status post bilateral iliac stenting, moderate renal stenosis, hyperlipidemia, severe  hypertension with history of episodic shortness of breath and chest squeezing relieved with albuterol/nebs who presents for routine followup.  She was evaluated in the hospital at [facility] in 2012 for general malaise, severe hypertension, shortness of breath, chest pain. She had a stress test, lexiscan that showed no significant ischemia. She did appear to have bronchospasm during the study (same chest tightness) and had a nebulizer treatment with significant improvement of her symptoms. Systolic pressures have been 150-160. She is cutting back on her clonidine and hydralazine to avoid low blood pressures. She does have significant left hip discomfort.  She had a recent hospital visit on 01/08/2012 for shortness of breath and syncope. She was found to have significant anemia. Treated with Lasix for diastolic CHF. No evidence on telemetry. Underlying severe left carotid arterial disease estimated at 70%. She was discharged without Lasix. She continues to have mild edema and mild shortness of breath   She is not able to tolerate statins. She does handle zetia without significant side effects. We have previously encouraged red yeast rice.    EKG shows normal sinus rhythm rate 68 beats per minute, no significant ST or T wave changes     Outpatient Encounter Prescriptions as of 09/27/2011  Medication Sig Dispense Refill  . albuterol (PROAIR HFA) 108 (90 BASE) MCG/ACT inhaler Inhale 2 puffs into the lungs every 6 (six) hours as needed.        Marland Kitchen aspirin 81 MG tablet Take 81 mg by mouth daily.        Marland Kitchen buPROPion (WELLBUTRIN SR) 150 MG 12 hr tablet Take 150 mg by mouth  daily.        . Calcium Carbonate-Vitamin D (CALCIUM-VITAMIN D) 600-200 MG-UNIT CAPS Take 2 capsules by mouth daily.        . cholecalciferol (VITAMIN D-400) 400 UNITS TABS Take 400 Units by mouth daily.        . cloNIDine (CATAPRES - DOSED IN MG/24 HR) 0.3 mg/24hr Place 2 patches (0.6 mg total) onto the skin once a week.  24 patch  4  . dexlansoprazole (DEXILANT) 60 MG capsule Take 60 mg by mouth daily.        Marland Kitchen doxazosin (CARDURA) 2 MG tablet TAKE 1 TABLET BY MOUTH ONCE A DAY  90 tablet  2  . ezetimibe (ZETIA) 10 MG tablet Take 10 mg by mouth daily.        . hydrALAZINE (APRESOLINE) 50 MG tablet Take 2 tablets (100 mg total) by mouth 3 (three) times daily. Take two (50 mg) tablets three times a day.  540 tablet  4  . metFORMIN (GLUCOPHAGE) 500 MG tablet Take 500 mg by mouth 2 (two) times daily with a meal.       . Multiple Vitamin (MULTIVITAMIN) tablet Take 1 tablet by mouth daily.        . Nebivolol HCl 20 MG TABS Take 1 tablet (20 mg total) by mouth daily.  30 tablet  6  . nitroGLYCERIN (NITROSTAT) 0.4 MG SL tablet Place 1 tablet (0.4 mg total) under the tongue every 5 (five) minutes as needed for chest pain.  25 tablet  11  . olmesartan-hydrochlorothiazide (  BENICAR HCT) 40-25 MG per tablet Take 1 tablet by mouth daily.        Marland Kitchen tiotropium (SPIRIVA) 18 MCG inhalation capsule Place 18 mcg into inhaler and inhale daily.        . traMADol (ULTRAM) 50 MG tablet Take 50 mg by mouth every 6 (six) hours as needed.          Review of Systems  Constitutional: Negative.   HENT: Negative.        Hoarse  Voice x 2 days  Eyes: Negative.   Respiratory: Positive for shortness of breath.   Cardiovascular: Negative.   Gastrointestinal: Negative.   Musculoskeletal: Positive for gait problem.  Skin: Negative.   Neurological: Negative.   Hematological: Negative.   Psychiatric/Behavioral: Negative.   All other systems reviewed and are negative.    BP 124/60  Pulse 68  Wt 171 lb (77.565  kg)  Physical Exam  Nursing note and vitals reviewed. Constitutional: She is oriented to person, place, and time. She appears well-developed and well-nourished.  HENT:  Head: Normocephalic.  Nose: Nose normal.  Mouth/Throat: Oropharynx is clear and moist.  Eyes: Conjunctivae are normal. Pupils are equal, round, and reactive to light.  Neck: Normal range of motion. Neck supple. No JVD present. Carotid bruit is present.  Cardiovascular: Normal rate, regular rhythm, S1 normal, S2 normal and intact distal pulses.  Exam reveals no gallop and no friction rub.   Murmur heard.  Crescendo systolic murmur is present with a grade of 2/6  Pulmonary/Chest: Effort normal. No respiratory distress. She has decreased breath sounds. She has no wheezes. She has no rales. She exhibits no tenderness.  Abdominal: Soft. Bowel sounds are normal. She exhibits no distension. There is no tenderness.  Musculoskeletal: Normal range of motion. She exhibits no edema and no tenderness.  Lymphadenopathy:    She has no cervical adenopathy.  Neurological: She is alert and oriented to person, place, and time. Coordination normal.  Skin: Skin is warm and dry. No rash noted. No erythema.  Psychiatric: She has a normal mood and affect. Her behavior is normal. Judgment and thought content normal.         Assessment and Plan

## 2012-01-24 NOTE — Assessment & Plan Note (Signed)
Appears stable. Has followup with Dr. Kendrick Fries.

## 2012-01-24 NOTE — Patient Instructions (Addendum)
You are doing well. Take lasix as needed with potassium  Please call us if you have new issues that need to be addressed before your next appt.  Your physician wants you to follow-up in: 3 months.  You will receive a reminder letter in the mail two months in advance. If you don't receive a letter, please call our office to schedule the follow-up appointment.  rx's benicar/hct, bystolic, zetia, dexilant, spirvia, pro-air all sent to [facility]  rx triamcinoline acetonide cream USP was sent to [facility] in [place]

## 2012-01-29 ENCOUNTER — Telehealth: Payer: Self-pay | Admitting: Cardiovascular Disease

## 2012-01-29 NOTE — Telephone Encounter (Signed)
LMTCB  TOMORROW ./CY

## 2012-01-29 NOTE — Telephone Encounter (Signed)
Pt thinks she is allergic to the lasix is there something else she can take

## 2012-01-30 NOTE — Telephone Encounter (Signed)
Follow-up:     Patient returned Christine's call. Please call back.

## 2012-01-30 NOTE — Telephone Encounter (Signed)
Did take Lasix in the hospital and started itching but thought it was the pillow since all in head and face area.  Was not discharged home with the lasix but was given at visit on 4/4.  She didn't take the 1st lasix until Monday and was itching all night.  Has not had anymore since then and itching is better.  Did break out in little red splotches but that is better.  Would like something else in the place of this. Will forward to Dr Mariah Milling for review

## 2012-01-31 MED ORDER — SPIRONOLACTONE 50 MG PO TABS: 50.0000 mg | ORAL_TABLET | Freq: Every day | ORAL | Status: DC

## 2012-01-31 NOTE — Telephone Encounter (Signed)
We could try spironolactone 50 mg when necessary for edema or shortness of breath. If she starts to take this every day, we'll need to check her renal function in 2 weeks. If she takes it sparingly, we'll likely be okay.

## 2012-01-31 NOTE — Telephone Encounter (Signed)
Pt was notified and states she will try the spironolactone.  Medication was ordered at [facility] per pt request.

## 2012-02-26 ENCOUNTER — Inpatient Hospital Stay: Payer: Self-pay | Admitting: Specialist

## 2012-02-26 LAB — CBC
HCT: 26.6 % — ABNORMAL LOW (ref 35.0–47.0)
HGB: 8 g/dL — ABNORMAL LOW (ref 12.0–16.0)
MCHC: 30.3 g/dL — ABNORMAL LOW (ref 32.0–36.0)
MCV: 70 fL — ABNORMAL LOW (ref 80–100)
WBC: 22.8 10*3/uL — ABNORMAL HIGH (ref 3.6–11.0)

## 2012-02-26 LAB — BASIC METABOLIC PANEL
BUN: 11 mg/dL (ref 7–18)
Calcium, Total: 8.5 mg/dL (ref 8.5–10.1)
Co2: 29 mmol/L (ref 21–32)
Creatinine: 0.62 mg/dL (ref 0.60–1.30)
Osmolality: 263 (ref 275–301)
Potassium: 3.8 mmol/L (ref 3.5–5.1)
Sodium: 130 mmol/L — ABNORMAL LOW (ref 136–145)

## 2012-02-26 LAB — HEPATIC FUNCTION PANEL A (ARMC)
Alkaline Phosphatase: 72 U/L (ref 50–136)
Bilirubin,Total: 0.3 mg/dL (ref 0.2–1.0)
Total Protein: 6.8 g/dL (ref 6.4–8.2)

## 2012-02-26 LAB — CK TOTAL AND CKMB (NOT AT ARMC)
CK, Total: 68 U/L (ref 21–215)
CK, Total: 91 U/L (ref 21–215)
CK-MB: 1.4 ng/mL (ref 0.5–3.6)
CK-MB: 1.9 ng/mL (ref 0.5–3.6)

## 2012-02-26 LAB — TROPONIN I
Troponin-I: <0.02 ng/mL
Troponin-I: <0.02 ng/mL

## 2012-02-26 LAB — PRO B NATRIURETIC PEPTIDE: B-Type Natriuretic Peptide: 2383 pg/mL — ABNORMAL HIGH (ref 0–450)

## 2012-02-26 LAB — LIPASE, BLOOD: Lipase: 62 U/L — ABNORMAL LOW (ref 73–393)

## 2012-02-27 LAB — CBC WITH DIFFERENTIAL/PLATELET
Basophil #: 0 10*3/uL (ref 0.0–0.1)
Basophil %: 0.2 %
Eosinophil #: 0 10*3/uL (ref 0.0–0.7)
Eosinophil %: 0.4 %
HCT: 24.6 % — ABNORMAL LOW (ref 35.0–47.0)
HGB: 7.4 g/dL — ABNORMAL LOW (ref 12.0–16.0)
Lymphocyte #: 1.7 10*3/uL (ref 1.0–3.6)
Lymphocyte %: 14.8 %
MCHC: 30.1 g/dL — ABNORMAL LOW (ref 32.0–36.0)
MCV: 70 fL — ABNORMAL LOW (ref 80–100)
Monocyte #: 1.2 x10 3/mm — ABNORMAL HIGH (ref 0.2–0.9)
Monocyte %: 10.4 %
Neutrophil #: 8.3 10*3/uL — ABNORMAL HIGH (ref 1.4–6.5)
Platelet: 407 10*3/uL (ref 150–440)
RBC: 3.54 10*6/uL — ABNORMAL LOW (ref 3.80–5.20)
RDW: 18.7 % — ABNORMAL HIGH (ref 11.5–14.5)
WBC: 11.2 10*3/uL — ABNORMAL HIGH (ref 3.6–11.0)

## 2012-02-27 LAB — IRON AND TIBC
Iron Bind.Cap.(Total): 376 ug/dL (ref 250–450)
Iron Saturation: 12 %
Iron: 44 ug/dL — ABNORMAL LOW (ref 50–170)

## 2012-02-27 LAB — COMPREHENSIVE METABOLIC PANEL
Albumin: 3 g/dL — ABNORMAL LOW (ref 3.4–5.0)
Alkaline Phosphatase: 68 U/L (ref 50–136)
BUN: 9 mg/dL (ref 7–18)
Bilirubin,Total: 0.2 mg/dL (ref 0.2–1.0)
Chloride: 94 mmol/L — ABNORMAL LOW (ref 98–107)
Co2: 35 mmol/L — ABNORMAL HIGH (ref 21–32)
Creatinine: 0.65 mg/dL (ref 0.60–1.30)
EGFR (African American): >60
Glucose: 179 mg/dL — ABNORMAL HIGH (ref 65–99)
Osmolality: 268 (ref 275–301)
SGOT(AST): 16 U/L (ref 15–37)
SGPT (ALT): 21 U/L
Sodium: 132 mmol/L — ABNORMAL LOW (ref 136–145)
Total Protein: 6.4 g/dL (ref 6.4–8.2)

## 2012-02-27 LAB — FERRITIN: Ferritin (ARMC): 25 ng/mL (ref 8–388)

## 2012-02-28 LAB — CBC WITH DIFFERENTIAL/PLATELET
Basophil #: 0 10*3/uL (ref 0.0–0.1)
Basophil %: 0.4 %
Eosinophil %: 1.3 %
HCT: 25.8 % — ABNORMAL LOW (ref 35.0–47.0)
Lymphocyte #: 2.3 10*3/uL (ref 1.0–3.6)
Lymphocyte %: 20.8 %
MCH: 21.3 pg — ABNORMAL LOW (ref 26.0–34.0)
MCHC: 30.2 g/dL — ABNORMAL LOW (ref 32.0–36.0)
MCV: 71 fL — ABNORMAL LOW (ref 80–100)
Monocyte %: 8.1 %
Neutrophil #: 7.7 10*3/uL — ABNORMAL HIGH (ref 1.4–6.5)
Neutrophil %: 69.4 %
Platelet: 436 10*3/uL (ref 150–440)
RBC: 3.67 10*6/uL — ABNORMAL LOW (ref 3.80–5.20)
RDW: 18.8 % — ABNORMAL HIGH (ref 11.5–14.5)
WBC: 11.1 10*3/uL — ABNORMAL HIGH (ref 3.6–11.0)

## 2012-02-29 LAB — HEMOGLOBIN: HGB: 7.2 g/dL — ABNORMAL LOW (ref 12.0–16.0)

## 2012-02-29 LAB — VANCOMYCIN, TROUGH: Vancomycin, Trough: 5 ug/mL — ABNORMAL LOW (ref 10–20)

## 2012-03-01 LAB — CREATININE, SERUM
Creatinine: 0.57 mg/dL — ABNORMAL LOW (ref 0.60–1.30)
EGFR (Non-African Amer.): >60

## 2012-03-01 LAB — OCCULT BLOOD X 1 CARD TO LAB, STOOL: Occult Blood, Feces: NEGATIVE

## 2012-03-02 LAB — BASIC METABOLIC PANEL
Calcium, Total: 8.3 mg/dL — ABNORMAL LOW (ref 8.5–10.1)
Chloride: 98 mmol/L (ref 98–107)
Co2: 32 mmol/L (ref 21–32)
EGFR (African American): >60
Glucose: 144 mg/dL — ABNORMAL HIGH (ref 65–99)
Osmolality: 271 (ref 275–301)
Potassium: 4.1 mmol/L (ref 3.5–5.1)
Sodium: 135 mmol/L — ABNORMAL LOW (ref 136–145)

## 2012-03-02 LAB — CBC WITH DIFFERENTIAL/PLATELET
Basophil #: 0.1 10*3/uL (ref 0.0–0.1)
Eosinophil #: 0.3 10*3/uL (ref 0.0–0.7)
HGB: 8.1 g/dL — ABNORMAL LOW (ref 12.0–16.0)
MCH: 22.6 pg — ABNORMAL LOW (ref 26.0–34.0)
Monocyte %: 10.5 %
Neutrophil %: 66.4 %
Platelet: 378 10*3/uL (ref 150–440)
RDW: 20.5 % — ABNORMAL HIGH (ref 11.5–14.5)

## 2012-03-02 LAB — CULTURE, BLOOD (SINGLE)

## 2012-03-11 ENCOUNTER — Other Ambulatory Visit: Payer: Self-pay | Admitting: Cardiovascular Disease

## 2012-03-11 NOTE — Telephone Encounter (Signed)
Error

## 2012-03-25 ENCOUNTER — Ambulatory Visit: Payer: Self-pay | Admitting: Internal Medicine

## 2012-03-25 LAB — CREATININE, SERUM
Creatinine: 0.68 mg/dL (ref 0.60–1.30)
EGFR (Non-African Amer.): >60

## 2012-04-28 ENCOUNTER — Ambulatory Visit: Payer: Medicare Other | Admitting: Cardiovascular Disease

## 2012-05-07 ENCOUNTER — Ambulatory Visit (INDEPENDENT_AMBULATORY_CARE_PROVIDER_SITE_OTHER): Payer: Medicare Other | Admitting: Cardiovascular Disease

## 2012-05-07 ENCOUNTER — Encounter: Payer: Self-pay | Admitting: Cardiovascular Disease

## 2012-05-07 VITALS — BP 136/71 | HR 60 | Ht 61.0 in | Wt 165.5 lb

## 2012-05-07 DIAGNOSIS — I10 Essential (primary) hypertension: Secondary | ICD-10-CM

## 2012-05-07 DIAGNOSIS — I6529 Occlusion and stenosis of unspecified carotid artery: Secondary | ICD-10-CM

## 2012-05-07 DIAGNOSIS — E785 Hyperlipidemia, unspecified: Secondary | ICD-10-CM

## 2012-05-07 DIAGNOSIS — J449 Chronic obstructive pulmonary disease, unspecified: Secondary | ICD-10-CM

## 2012-05-07 DIAGNOSIS — I739 Peripheral vascular disease, unspecified: Secondary | ICD-10-CM

## 2012-05-07 DIAGNOSIS — R0602 Shortness of breath: Secondary | ICD-10-CM

## 2012-05-07 MED ORDER — DOXAZOSIN MESYLATE 4 MG PO TABS: 4.0000 mg | ORAL_TABLET | Freq: Every day | ORAL | Status: DC

## 2012-05-07 NOTE — Assessment & Plan Note (Signed)
Stable, followed by Dr. Welton Flakes [facility]). She reports having recent CT scans and MRI, PFTs and sleep studies

## 2012-05-07 NOTE — Assessment & Plan Note (Signed)
Followed by Dr Dew.   

## 2012-05-07 NOTE — Assessment & Plan Note (Signed)
Previous intervention to her legs by Dr. Earnestine Leys

## 2012-05-07 NOTE — Assessment & Plan Note (Addendum)
We have suggested she increase the cardura to 4 mg in the PM. If blood pressure continues to be elevated in the morning, she could move her Benicar to the evening.

## 2012-05-07 NOTE — Progress Notes (Signed)
Patient ID: Catherine Holmes, female    DOB: 05/04/1928, 78 y.o.   MRN: [id number]  HPI Comments: 78 year old woman with a history of DM,  peripheral vascular disease, 60% carotid arterial disease as well as moderate arterial disease of the lower extremities, status post bilateral iliac stenting, moderate renal stenosis, hyperlipidemia, severe  hypertension with history of episodic shortness of breath and chest squeezing relieved with albuterol/nebs who presents for routine followup. She is followed by Dr. Wyn Quaker for her peripheral vascular disease.   She was evaluated in the hospital at [facility] in 2012 for general malaise, severe hypertension, shortness of breath, chest pain. She had a stress test, lexiscan that showed no significant ischemia.  She had a recent hospital visit on 01/08/2012 for shortness of breath and syncope. She was found to have significant anemia. Treated with Lasix for diastolic CHF. No evidence on telemetry. Underlying severe left carotid arterial disease estimated at 70%.   she was given Lasix on her last clinic visit and reports having significant itching, possibly from a sulfa allergy.  She has had followup with Dr. Welton Flakes in pulmonary with numerous CT scans, MRIs, PFTs, sleep study. She was told that she had hypoxemia overnight and was started on nighttime oxygen.   She has had a fall and hurt the ribs on her left side She is not able to tolerate statins. She does handle zetia without significant side effects. We have previously encouraged red yeast rice.    EKG shows normal sinus rhythm rate 60 beats per minute, no significant ST or T wave changes  Outpatient Encounter Prescriptions 78 y.o. as of 05/07/2012  Medication Sig Dispense Refill  . albuterol (PROAIR HFA) 108 (90 BASE) MCG/ACT inhaler Inhale 2 puffs into the lungs every 6 (six) hours as needed.  3 Inhaler  0  . albuterol (PROVENTIL) (2.5 MG/3ML) 0.083% nebulizer solution Take 2.5 mg by nebulization every 6 (six) hours as  needed.        Marland Kitchen arformoterol (BROVANA) 15 MCG/2ML NEBU Take 2 mLs (15 mcg total) by nebulization 2 (two) times daily.  120 mL  4  . aspirin 81 MG tablet Take 81 mg by mouth daily.        . Cholecalciferol (VITAMIN D-3 PO) Take 1,000 Units by mouth.      . cloNIDine (CATAPRES) 0.2 MG tablet Take 0.2 mg by mouth 3 (three) times daily.      Marland Kitchen dexlansoprazole (DEXILANT) 60 MG capsule Take 1 capsule (60 mg total) by mouth daily.  90 capsule  0  . doxazosin (CARDURA) 4 MG tablet Take 1 tablet (4 mg total) by mouth at bedtime.  90 tablet  3  . ezetimibe (ZETIA) 10 MG tablet Take 1 tablet (10 mg total) by mouth daily.  90 tablet  3  . hydrALAZINE (APRESOLINE) 50 MG tablet Take two (50 mg) tablets three times a day.      . metFORMIN (GLUCOPHAGE) 500 MG tablet Take 500 mg by mouth 2 (two) times daily with a meal.       . Multiple Vitamin (MULTIVITAMIN) tablet Take 1 tablet by mouth daily.        . nebivolol (BYSTOLIC) 10 MG tablet Take 1 tablet twice daily  120 tablet  3  . nitroGLYCERIN (NITROSTAT) 0.4 MG SL tablet Place 1 tablet (0.4 mg total) under the tongue every 5 (five) minutes as needed for chest pain.  25 tablet  11  . olmesartan-hydrochlorothiazide (BENICAR HCT) 40-25 MG per tablet Take 1 tablet  by mouth daily.  90 tablet  3  . spironolactone (ALDACTONE) 50 MG tablet Take 1 tablet (50 mg total) by mouth daily.  30 tablet  6  . tiotropium (SPIRIVA) 18 MCG inhalation capsule Place 1 capsule (18 mcg total) into inhaler and inhale daily.  90 capsule  0  . traMADol (ULTRAM) 50 MG tablet Take 50 mg by mouth every 6 (six) hours as needed.          Review of Systems  Constitutional: Negative.   HENT: Negative.   Eyes: Negative.   Respiratory: Positive for shortness of breath.   Cardiovascular: Negative.   Gastrointestinal: Negative.   Musculoskeletal: Positive for gait problem.  Skin: Negative.   Neurological: Negative.   Hematological: Negative.   Psychiatric/Behavioral: Negative.   All  other systems reviewed and are negative.    Pulse 60  Ht 5\' 1"  (1.549 m)  Wt 165 lb 8 oz (75.07 kg)  BMI 31.27 kg/m2  Physical Exam  Nursing note and vitals reviewed. Constitutional: She is oriented to person, place, and time. She appears well-developed and well-nourished.  HENT:  Head: Normocephalic.  Nose: Nose normal.  Mouth/Throat: Oropharynx is clear and moist.  Eyes: Conjunctivae are normal. Pupils are equal, round, and reactive to light.  Neck: Normal range of motion. Neck supple. No JVD present. Carotid bruit is present.  Cardiovascular: Normal rate, regular rhythm, S1 normal, S2 normal and intact distal pulses.  Exam reveals no gallop and no friction rub.   Murmur heard.  Crescendo systolic murmur is present with a grade of 2/6  Pulmonary/Chest: Effort normal. No respiratory distress. She has decreased breath sounds. She has no wheezes. She has no rales. She exhibits no tenderness.  Abdominal: Soft. Bowel sounds are normal. She exhibits no distension. There is no tenderness.  Musculoskeletal: Normal range of motion. She exhibits no edema and no tenderness.  Lymphadenopathy:    She has no cervical adenopathy.  Neurological: She is alert and oriented to person, place, and time. Coordination normal.  Skin: Skin is warm and dry. No rash noted. No erythema.  Psychiatric: She has a normal mood and affect. Her behavior is normal. Judgment and thought content normal.         Assessment and Plan

## 2012-05-07 NOTE — Patient Instructions (Addendum)
You are doing well. Please increase the cardura 4 mg at night for blood pressure  Please call Dr. Wyn Quaker for a carotid ultrasoiund or we can do it here in the office  Please call us if you have new issues that need to be addressed before your next appt.  Your physician wants you to follow-up in: 6 months.  You will receive a reminder letter in the mail two months in advance. If you don't receive a letter, please call our office to schedule the follow-up appointment.

## 2012-05-07 NOTE — Assessment & Plan Note (Signed)
Continue zetia. She does not want a statin.  

## 2012-06-03 ENCOUNTER — Ambulatory Visit: Payer: Self-pay | Admitting: Internal Medicine

## 2012-06-21 ENCOUNTER — Ambulatory Visit: Payer: Self-pay | Admitting: Hematology and Oncology

## 2012-07-01 ENCOUNTER — Ambulatory Visit: Payer: Self-pay | Admitting: Hematology and Oncology

## 2012-07-02 ENCOUNTER — Ambulatory Visit: Payer: Medicare Other | Admitting: Cardiovascular Disease

## 2012-07-02 ENCOUNTER — Telehealth: Payer: Self-pay | Admitting: Cardiovascular Disease

## 2012-07-02 NOTE — Telephone Encounter (Signed)
Pt daughter called stating that her mothers bp is running high. Pt had an iron infusion yesterday.dont know if that is what caused it. Pt has taking all her medication and her bp this am is 192/74.

## 2012-07-02 NOTE — Telephone Encounter (Signed)
Pt informed.  Understanding verb. Appt for today is cancelled. Pt will call us tomm/Friday if BP does not improve with changes made.

## 2012-07-02 NOTE — Telephone Encounter (Signed)
Pt says she had iron infusion yesterday. During infusion BP was noted to be elevated at 170/90. Pt was sent home.  Pt's BP continued to elevate, climbing to 200/120 at 0300 this am. Pt says she did not sleep at all, "I was just so sick".  She has taken all her BP meds as prescribed and even took an extra hydralazine this am.  Bp is still elevated at 192/74. She c/o dizziness and feels "very weak". She says " I have good days and bad days but this is a 'scary' day!". She denies h/a or vision changes. She would like to be seen. Appt made for today at 245 since Dr. Mariah Milling had a cancellation.  Understanding verb.

## 2012-07-02 NOTE — Telephone Encounter (Signed)
Discussed with Dr. Mariah Milling who does not feel pt needs to be seen today but should first try taking extra clonidine this am and let us know if this helps BP. He explains it may be r/t Iron infusion she rec'd yesterday and may need to give it a few more days to see if BP comes back to normal. I will call pt to explain.

## 2012-07-03 ENCOUNTER — Ambulatory Visit (INDEPENDENT_AMBULATORY_CARE_PROVIDER_SITE_OTHER): Payer: Medicare Other

## 2012-07-03 VITALS — BP 123/71 | HR 72 | Ht 61.0 in | Wt 160.5 lb

## 2012-07-03 DIAGNOSIS — I10 Essential (primary) hypertension: Secondary | ICD-10-CM

## 2012-07-03 NOTE — Telephone Encounter (Signed)
Pt says BP still high (199/55) at 0830 this am. She took all her meds as prescribed.  BP while on phone with me=142/52 and 160/55. She wonders if home BP cuff is accurate. I offered to have her come in to compare home BP cuff and ours. She will come in today at 330 for BP check. Dtr also tells me on phone she wants pt seen. I explained Dr. Mariah Milling not in office today. She wants pt seen tomm pm.  Appt made with Dr. Mariah Milling tomm at 3:15 per dtr's request. Pt still c/o feeling weak and "sick".

## 2012-07-03 NOTE — Progress Notes (Signed)
Pt has been c/o elevated BP via home BP cuff. She says she has been getting readings around 190-200 systolic. She also c/o generalized fatigue and malaise. She feels this is associated with her high BP.  She is here today to compare her home BP monitor with our BP cuff.  BP readings with our manual cuff=128/60, 5 mins later=140/80                              Pt's home cuff=  171/66, 5 mins later=123/71 I explained machines are not correlating; home BPs are not accurate. Pt states she is still feeling poorly and would like to address with Dr. Mariah Milling tomm at appt.

## 2012-07-03 NOTE — Patient Instructions (Addendum)
Keep appt with Dr. Mariah Milling tomorrow.

## 2012-07-03 NOTE — Telephone Encounter (Signed)
Pt's daughter called back to state the bp for pt is 199/55 today and would like to speak to nurse about trying to be seen today instead of tomorrow.  Informed pt physician not in office today.

## 2012-07-04 ENCOUNTER — Encounter: Payer: Self-pay | Admitting: Cardiovascular Disease

## 2012-07-04 ENCOUNTER — Ambulatory Visit (INDEPENDENT_AMBULATORY_CARE_PROVIDER_SITE_OTHER): Payer: Medicare Other | Admitting: Cardiovascular Disease

## 2012-07-04 VITALS — BP 130/70 | HR 67 | Ht 61.0 in | Wt 161.0 lb

## 2012-07-04 DIAGNOSIS — J438 Other emphysema: Secondary | ICD-10-CM

## 2012-07-04 DIAGNOSIS — I10 Essential (primary) hypertension: Secondary | ICD-10-CM

## 2012-07-04 DIAGNOSIS — R0602 Shortness of breath: Secondary | ICD-10-CM

## 2012-07-04 DIAGNOSIS — I509 Heart failure, unspecified: Secondary | ICD-10-CM

## 2012-07-04 DIAGNOSIS — R079 Chest pain, unspecified: Secondary | ICD-10-CM

## 2012-07-04 DIAGNOSIS — I739 Peripheral vascular disease, unspecified: Secondary | ICD-10-CM

## 2012-07-04 NOTE — Assessment & Plan Note (Signed)
Blood pressure is well controlled on today's visit. No changes made to the medications. She did require extra clonidine and hydralazine following iron infusion.

## 2012-07-04 NOTE — Assessment & Plan Note (Signed)
Currently on symbicort and albuterol with nebulizers

## 2012-07-04 NOTE — Patient Instructions (Addendum)
You are doing well. Do a trial of benicar 20 mg twice a day, hold the benicar/HCT for two weeks  For iron infusion, try tylenol and zantz/pepcid before (generic, ranitidine)  Please call us if you have new issues that need to be addressed before your next appt.  Your physician wants you to follow-up in: 6 months.  You will receive a reminder letter in the mail two months in advance. If you don't receive a letter, please call our office to schedule the follow-up appointment.

## 2012-07-04 NOTE — Assessment & Plan Note (Signed)
She has a sulfa allergy and has not been able to tolerate Lasix. She does report spots and I'm concerned this could be from HCTZ. We'll try Benicar alone without HCTZ for 2 weeks.

## 2012-07-04 NOTE — Assessment & Plan Note (Signed)
Followed by Dr Dew.   

## 2012-07-04 NOTE — Progress Notes (Signed)
Patient ID: Catherine Holmes, female    DOB: August 22, 1928, 78 y.o.   MRN: [id number]  HPI Comments: 78 year old woman with a history of DM,  peripheral vascular disease, 60% carotid arterial disease as well as moderate arterial disease of the lower extremities, status post bilateral iliac stenting, moderate renal stenosis, hyperlipidemia, severe  hypertension with history of episodic shortness of breath and chest squeezing relieved with albuterol/nebs who presents for routine followup. She is followed by Dr. Wyn Quaker for her peripheral vascular disease.   She was evaluated in the hospital at [facility] in 2012 for general malaise, severe hypertension, shortness of breath, chest pain. She had a stress test, lexiscan that showed no significant ischemia.  She had a hospital visit on 01/08/2012 for shortness of breath and syncope. She was found to have significant anemia. Treated with Lasix for diastolic CHF. No evidence on telemetry. Underlying severe left carotid arterial disease estimated at 70%.   she was given Lasix on her last clinic visit and reports having significant itching, possibly from a sulfa allergy.  She has had followup with Dr. Welton Flakes in pulmonary with numerous CT scans, MRIs, PFTs, sleep study. She was told that she had hypoxemia overnight and was started on nighttime oxygen.   She has had a fall and hurt the ribs on her left side She is not able to tolerate statins. She does handle zetia without significant side effects. We have previously encouraged red yeast rice.   She had recent iron infusion last week and following this had hypertension, malaise, headaches. She took extra clonidine and hydralazine and blood pressure improved. Since then, it has been better. Blood pressure this morning was in the 130 systolic range. She does continue to have red spots on her arms which scar.  EKG shows normal sinus rhythm rate 67 beats per minute, no significant ST or T wave changes  Outpatient Encounter  Prescriptions as of 07/04/2012  Medication Sig Dispense Refill  . albuterol (PROAIR HFA) 108 (90 BASE) MCG/ACT inhaler Inhale 2 puffs into the lungs every 6 (six) hours as needed.  3 Inhaler  0  . albuterol (PROVENTIL) (2.5 MG/3ML) 0.083% nebulizer solution Take 2.5 mg by nebulization every 6 (six) hours as needed.        Marland Kitchen arformoterol (BROVANA) 15 MCG/2ML NEBU Take 2 mLs (15 mcg total) by nebulization 2 (two) times daily.  120 mL  4  . aspirin 81 MG tablet Take 81 mg by mouth daily.        . Cholecalciferol (VITAMIN D-3 PO) Take 1,000 Units by mouth.      . cloNIDine (CATAPRES) 0.2 MG tablet Take 0.2 mg by mouth 3 (three) times daily.      Marland Kitchen dexlansoprazole (DEXILANT) 60 MG capsule Take 1 capsule (60 mg total) by mouth daily.  90 capsule  0  . doxazosin (CARDURA) 4 MG tablet Take 1 tablet (4 mg total) by mouth at bedtime.  90 tablet  3  . ezetimibe (ZETIA) 10 MG tablet Take 1 tablet (10 mg total) by mouth daily.  90 tablet  3  . hydrALAZINE (APRESOLINE) 50 MG tablet Take two (50 mg) tablets three times a day.      . metFORMIN (GLUCOPHAGE) 500 MG tablet Take 500 mg by mouth 2 (two) times daily with a meal.       . Multiple Vitamin (MULTIVITAMIN) tablet Take 1 tablet by mouth daily.        . nitroGLYCERIN (NITROSTAT) 0.4 MG SL tablet Place  1 tablet (0.4 mg total) under the tongue every 5 (five) minutes as needed for chest pain.  25 tablet  11  . olmesartan-hydrochlorothiazide (BENICAR HCT) 40-25 MG per tablet Take 1 tablet by mouth daily.  90 tablet  3  . spironolactone (ALDACTONE) 50 MG tablet Take 1 tablet (50 mg total) by mouth daily.  30 tablet  6  . tiotropium (SPIRIVA) 18 MCG inhalation capsule Place 1 capsule (18 mcg total) into inhaler and inhale daily.  90 capsule  0  . traMADol (ULTRAM) 50 MG tablet Take 50 mg by mouth every 6 (six) hours as needed.        . nebivolol (BYSTOLIC) 10 MG tablet Take 1 tablet twice daily  120 tablet  3     Review of Systems  Constitutional: Negative.     HENT: Negative.   Eyes: Negative.   Cardiovascular: Negative.   Gastrointestinal: Negative.   Skin: Negative.   Neurological: Negative.   Hematological: Negative.   Psychiatric/Behavioral: Negative.   All other systems reviewed and are negative.    BP 130/70  Pulse 67  Ht 5\' 1"  (1.549 m)  Wt 161 lb (73.029 kg)  BMI 30.42 kg/m2  Physical Exam  Nursing note and vitals reviewed. Constitutional: She is oriented to person, place, and time. She appears well-developed and well-nourished.  HENT:  Head: Normocephalic.  Nose: Nose normal.  Mouth/Throat: Oropharynx is clear and moist.  Eyes: Conjunctivae normal are normal. Pupils are equal, round, and reactive to light.  Neck: Normal range of motion. Neck supple. No JVD present. Carotid bruit is present.  Cardiovascular: Normal rate, regular rhythm, S1 normal, S2 normal and intact distal pulses.  Exam reveals no gallop and no friction rub.   Murmur heard.  Crescendo systolic murmur is present with a grade of 2/6  Pulmonary/Chest: Effort normal. No respiratory distress. She has decreased breath sounds. She has no wheezes. She has no rales. She exhibits no tenderness.  Abdominal: Soft. Bowel sounds are normal. She exhibits no distension. There is no tenderness.  Musculoskeletal: Normal range of motion. She exhibits no edema and no tenderness.  Lymphadenopathy:    She has no cervical adenopathy.  Neurological: She is alert and oriented to person, place, and time. Coordination normal.  Skin: Skin is warm and dry. No rash noted. No erythema.  Psychiatric: She has a normal mood and affect. Her behavior is normal. Judgment and thought content normal.         Assessment and Plan

## 2012-07-11 LAB — CBC CANCER CENTER
Basophil %: 0.5 %
Eosinophil %: 1.4 %
Lymphocyte #: 1.9 x10 3/mm (ref 1.0–3.6)
MCH: 25.5 pg — ABNORMAL LOW (ref 26.0–34.0)
MCHC: 32.7 g/dL (ref 32.0–36.0)
MCV: 78 fL — ABNORMAL LOW (ref 80–100)
Monocyte #: 0.6 x10 3/mm (ref 0.2–0.9)
Monocyte %: 6.9 %
Neutrophil %: 70.1 %
Platelet: 342 x10 3/mm (ref 150–440)
RBC: 4.04 10*6/uL (ref 3.80–5.20)

## 2012-07-11 LAB — IRON AND TIBC
Iron Saturation: 20 %
Unbound Iron-Bind.Cap.: 257 ug/dL

## 2012-07-22 ENCOUNTER — Ambulatory Visit: Payer: Self-pay | Admitting: Hematology and Oncology

## 2012-08-20 ENCOUNTER — Other Ambulatory Visit: Payer: Self-pay | Admitting: *Deleted

## 2012-08-20 MED ORDER — NITROGLYCERIN 0.4 MG SL SUBL: 0.4000 mg | SUBLINGUAL_TABLET | SUBLINGUAL | Status: DC | PRN

## 2012-08-20 NOTE — Telephone Encounter (Signed)
Refilled Nitroglycerin 

## 2012-09-04 ENCOUNTER — Ambulatory Visit: Payer: Self-pay | Admitting: Neurology

## 2012-09-16 ENCOUNTER — Ambulatory Visit: Payer: Self-pay | Admitting: Ophthalmology

## 2012-09-17 ENCOUNTER — Ambulatory Visit: Payer: Self-pay | Admitting: Internal Medicine

## 2012-09-20 ENCOUNTER — Ambulatory Visit: Payer: Self-pay

## 2012-09-24 ENCOUNTER — Ambulatory Visit: Payer: Self-pay | Admitting: Family Medicine

## 2012-10-02 ENCOUNTER — Ambulatory Visit: Payer: Self-pay

## 2012-10-06 ENCOUNTER — Ambulatory Visit: Payer: Self-pay | Admitting: Internal Medicine

## 2012-10-08 LAB — COMPREHENSIVE METABOLIC PANEL
Albumin: 3.1 g/dL — ABNORMAL LOW (ref 3.4–5.0)
Alkaline Phosphatase: 111 U/L (ref 50–136)
Anion Gap: 7 (ref 7–16)
BUN: 6 mg/dL — ABNORMAL LOW (ref 7–18)
Bilirubin,Total: 0.5 mg/dL (ref 0.2–1.0)
Calcium, Total: 8.1 mg/dL — ABNORMAL LOW (ref 8.5–10.1)
Co2: 28 mmol/L (ref 21–32)
Creatinine: 0.47 mg/dL — ABNORMAL LOW (ref 0.60–1.30)
Glucose: 173 mg/dL — ABNORMAL HIGH (ref 65–99)
Osmolality: 259 (ref 275–301)
Potassium: 3.2 mmol/L — ABNORMAL LOW (ref 3.5–5.1)
SGPT (ALT): 31 U/L (ref 12–78)
Sodium: 128 mmol/L — ABNORMAL LOW (ref 136–145)

## 2012-10-08 LAB — CBC
MCH: 30 pg (ref 26.0–34.0)
MCHC: 33.5 g/dL (ref 32.0–36.0)
MCV: 90 fL (ref 80–100)
RDW: 14.9 % — ABNORMAL HIGH (ref 11.5–14.5)
WBC: 18.1 10*3/uL — ABNORMAL HIGH (ref 3.6–11.0)

## 2012-10-08 LAB — PRO B NATRIURETIC PEPTIDE: B-Type Natriuretic Peptide: 3715 pg/mL — ABNORMAL HIGH (ref 0–450)

## 2012-10-09 ENCOUNTER — Inpatient Hospital Stay: Payer: Self-pay | Admitting: Internal Medicine

## 2012-10-09 LAB — BASIC METABOLIC PANEL
Anion Gap: 6 — ABNORMAL LOW (ref 7–16)
BUN: 7 mg/dL (ref 7–18)
Co2: 31 mmol/L (ref 21–32)
Creatinine: 0.64 mg/dL (ref 0.60–1.30)
EGFR (African American): >60
EGFR (Non-African Amer.): >60
Osmolality: 255 (ref 275–301)

## 2012-10-09 LAB — CBC WITH DIFFERENTIAL/PLATELET
Basophil #: 0 10*3/uL (ref 0.0–0.1)
Basophil %: 0.2 %
Eosinophil #: 0 10*3/uL (ref 0.0–0.7)
HCT: 30.5 % — ABNORMAL LOW (ref 35.0–47.0)
HGB: 10 g/dL — ABNORMAL LOW (ref 12.0–16.0)
Lymphocyte %: 8.1 %
MCHC: 32.8 g/dL (ref 32.0–36.0)
Monocyte #: 1 x10 3/mm — ABNORMAL HIGH (ref 0.2–0.9)
Neutrophil %: 83.5 %
RDW: 15.1 % — ABNORMAL HIGH (ref 11.5–14.5)
WBC: 12.7 10*3/uL — ABNORMAL HIGH (ref 3.6–11.0)

## 2012-10-09 LAB — TROPONIN I
Troponin-I: 0.02 ng/mL
Troponin-I: <0.02 ng/mL

## 2012-10-09 LAB — OSMOLALITY, URINE: Osmolality: 422 mOsm/kg

## 2012-10-10 ENCOUNTER — Encounter: Payer: Self-pay | Admitting: Internal Medicine

## 2012-10-10 LAB — CBC WITH DIFFERENTIAL/PLATELET
Basophil #: 0 10*3/uL (ref 0.0–0.1)
Basophil %: 0.2 %
Eosinophil #: 0 10*3/uL (ref 0.0–0.7)
Eosinophil %: 0.3 %
Lymphocyte #: 1 10*3/uL (ref 1.0–3.6)
Lymphocyte %: 7.4 %
MCV: 89 fL (ref 80–100)
Monocyte #: 1 x10 3/mm — ABNORMAL HIGH (ref 0.2–0.9)
Neutrophil #: 11.4 10*3/uL — ABNORMAL HIGH (ref 1.4–6.5)
Platelet: 344 10*3/uL (ref 150–440)
RBC: 3.51 10*6/uL — ABNORMAL LOW (ref 3.80–5.20)
WBC: 13.4 10*3/uL — ABNORMAL HIGH (ref 3.6–11.0)

## 2012-10-10 LAB — SODIUM: Sodium: 129 mmol/L — ABNORMAL LOW (ref 136–145)

## 2012-10-10 LAB — URINALYSIS, COMPLETE
Bacteria: NONE SEEN
Blood: NEGATIVE
Glucose,UR: NEGATIVE mg/dL (ref 0–75)
Ketone: NEGATIVE
Leukocyte Esterase: NEGATIVE
Nitrite: NEGATIVE
Protein: 30
RBC,UR: 1 /HPF (ref 0–5)
Specific Gravity: 1.034 (ref 1.003–1.030)
WBC UR: 2 /HPF (ref 0–5)

## 2012-10-11 LAB — BASIC METABOLIC PANEL
Anion Gap: 9 (ref 7–16)
BUN: 11 mg/dL (ref 7–18)
Calcium, Total: 8.6 mg/dL (ref 8.5–10.1)
EGFR (African American): >60
Glucose: 144 mg/dL — ABNORMAL HIGH (ref 65–99)
Osmolality: 263 (ref 275–301)

## 2012-10-12 LAB — BASIC METABOLIC PANEL
Anion Gap: 6 — ABNORMAL LOW (ref 7–16)
BUN: 8 mg/dL (ref 7–18)
Calcium, Total: 8.9 mg/dL (ref 8.5–10.1)
Creatinine: 0.5 mg/dL — ABNORMAL LOW (ref 0.60–1.30)
EGFR (African American): >60
EGFR (Non-African Amer.): >60
Glucose: 178 mg/dL — ABNORMAL HIGH (ref 65–99)
Sodium: 127 mmol/L — ABNORMAL LOW (ref 136–145)

## 2012-10-12 LAB — CBC WITH DIFFERENTIAL/PLATELET
Eosinophil %: 2.1 %
HGB: 11.4 g/dL — ABNORMAL LOW (ref 12.0–16.0)
Lymphocyte #: 1.7 10*3/uL (ref 1.0–3.6)
MCH: 29.5 pg (ref 26.0–34.0)
MCV: 87 fL (ref 80–100)
Monocyte #: 1 x10 3/mm — ABNORMAL HIGH (ref 0.2–0.9)
Neutrophil #: 6.5 10*3/uL (ref 1.4–6.5)
Platelet: 451 10*3/uL — ABNORMAL HIGH (ref 150–440)
RDW: 14.3 % (ref 11.5–14.5)

## 2012-10-13 LAB — BASIC METABOLIC PANEL
Anion Gap: 7 (ref 7–16)
BUN: 12 mg/dL (ref 7–18)
Calcium, Total: 8.9 mg/dL (ref 8.5–10.1)
Chloride: 88 mmol/L — ABNORMAL LOW (ref 98–107)
Creatinine: 0.53 mg/dL — ABNORMAL LOW (ref 0.60–1.30)
EGFR (Non-African Amer.): >60
Glucose: 146 mg/dL — ABNORMAL HIGH (ref 65–99)
Osmolality: 261 (ref 275–301)
Potassium: 4.5 mmol/L (ref 3.5–5.1)
Sodium: 129 mmol/L — ABNORMAL LOW (ref 136–145)

## 2012-10-14 LAB — CBC WITH DIFFERENTIAL/PLATELET
Basophil #: 0.1 10*3/uL (ref 0.0–0.1)
Basophil %: 0.7 %
HCT: 33 % — ABNORMAL LOW (ref 35.0–47.0)
HGB: 11 g/dL — ABNORMAL LOW (ref 12.0–16.0)
Lymphocyte #: 2.2 10*3/uL (ref 1.0–3.6)
Lymphocyte %: 19.1 %
MCHC: 33.4 g/dL (ref 32.0–36.0)
MCV: 87 fL (ref 80–100)
Monocyte #: 1.1 x10 3/mm — ABNORMAL HIGH (ref 0.2–0.9)
Monocyte %: 9.5 %
Neutrophil %: 68.2 %
Platelet: 484 10*3/uL — ABNORMAL HIGH (ref 150–440)
RDW: 14.9 % — ABNORMAL HIGH (ref 11.5–14.5)

## 2012-10-14 LAB — BASIC METABOLIC PANEL
BUN: 12 mg/dL (ref 7–18)
Creatinine: 0.66 mg/dL (ref 0.60–1.30)
EGFR (African American): >60
EGFR (Non-African Amer.): >60
Osmolality: 255 (ref 275–301)

## 2012-10-15 LAB — CBC WITH DIFFERENTIAL/PLATELET
Eosinophil #: 0.2 10*3/uL (ref 0.0–0.7)
Eosinophil %: 1.8 %
Lymphocyte #: 1.9 10*3/uL (ref 1.0–3.6)
Lymphocyte %: 19.9 %
MCH: 28.9 pg (ref 26.0–34.0)
MCHC: 33.1 g/dL (ref 32.0–36.0)
MCV: 87 fL (ref 80–100)
Monocyte #: 0.9 x10 3/mm (ref 0.2–0.9)
Neutrophil %: 68.6 %
Platelet: 524 10*3/uL — ABNORMAL HIGH (ref 150–440)

## 2012-10-15 LAB — BASIC METABOLIC PANEL
BUN: 13 mg/dL (ref 7–18)
Calcium, Total: 8.9 mg/dL (ref 8.5–10.1)
Chloride: 88 mmol/L — ABNORMAL LOW (ref 98–107)
Creatinine: 0.52 mg/dL — ABNORMAL LOW (ref 0.60–1.30)
Glucose: 153 mg/dL — ABNORMAL HIGH (ref 65–99)
Osmolality: 257 (ref 275–301)
Potassium: 4.5 mmol/L (ref 3.5–5.1)
Sodium: 126 mmol/L — ABNORMAL LOW (ref 136–145)

## 2012-10-22 ENCOUNTER — Encounter: Payer: Self-pay | Admitting: Internal Medicine

## 2012-10-22 HISTORY — PX: HIP FRACTURE SURGERY: SHX118

## 2012-10-23 LAB — BASIC METABOLIC PANEL
Anion Gap: 4 — ABNORMAL LOW (ref 7–16)
BUN: 10 mg/dL (ref 7–18)
Sodium: 128 mmol/L — ABNORMAL LOW (ref 136–145)

## 2012-10-30 LAB — BASIC METABOLIC PANEL
Anion Gap: 5 — ABNORMAL LOW (ref 7–16)
BUN: 11 mg/dL (ref 7–18)
Calcium, Total: 8.8 mg/dL (ref 8.5–10.1)
Chloride: 95 mmol/L — ABNORMAL LOW (ref 98–107)
EGFR (Non-African Amer.): >60
Glucose: 129 mg/dL — ABNORMAL HIGH (ref 65–99)

## 2012-11-06 LAB — BASIC METABOLIC PANEL
Anion Gap: 4 — ABNORMAL LOW (ref 7–16)
Calcium, Total: 8.6 mg/dL (ref 8.5–10.1)
Chloride: 97 mmol/L — ABNORMAL LOW (ref 98–107)
Co2: 32 mmol/L (ref 21–32)
EGFR (African American): >60
EGFR (Non-African Amer.): >60
Glucose: 121 mg/dL — ABNORMAL HIGH (ref 65–99)
Osmolality: 267 (ref 275–301)
Potassium: 3.8 mmol/L (ref 3.5–5.1)

## 2012-11-11 LAB — BASIC METABOLIC PANEL
Anion Gap: 3 — ABNORMAL LOW (ref 7–16)
BUN: 8 mg/dL (ref 7–18)
Calcium, Total: 8.7 mg/dL (ref 8.5–10.1)
Chloride: 101 mmol/L (ref 98–107)
Co2: 34 mmol/L — ABNORMAL HIGH (ref 21–32)
Creatinine: 0.58 mg/dL — ABNORMAL LOW (ref 0.60–1.30)
EGFR (Non-African Amer.): >60
Glucose: 96 mg/dL (ref 65–99)
Potassium: 3.7 mmol/L (ref 3.5–5.1)
Sodium: 138 mmol/L (ref 136–145)

## 2012-11-13 LAB — TSH: Thyroid Stimulating Horm: 1.66 u[IU]/mL

## 2012-11-20 ENCOUNTER — Encounter: Payer: Self-pay | Admitting: Cardiovascular Disease

## 2012-11-20 ENCOUNTER — Ambulatory Visit (INDEPENDENT_AMBULATORY_CARE_PROVIDER_SITE_OTHER): Payer: Medicare Other | Admitting: Cardiovascular Disease

## 2012-11-20 VITALS — BP 122/72 | HR 76 | Ht 61.0 in | Wt 152.8 lb

## 2012-11-20 DIAGNOSIS — I10 Essential (primary) hypertension: Secondary | ICD-10-CM

## 2012-11-20 DIAGNOSIS — I509 Heart failure, unspecified: Secondary | ICD-10-CM

## 2012-11-20 DIAGNOSIS — W19XXXA Unspecified fall, initial encounter: Secondary | ICD-10-CM

## 2012-11-20 DIAGNOSIS — I059 Rheumatic mitral valve disease, unspecified: Secondary | ICD-10-CM

## 2012-11-20 DIAGNOSIS — R0789 Other chest pain: Secondary | ICD-10-CM

## 2012-11-20 DIAGNOSIS — I6529 Occlusion and stenosis of unspecified carotid artery: Secondary | ICD-10-CM

## 2012-11-20 MED ORDER — OLMESARTAN MEDOXOMIL 40 MG PO TABS: 40.0000 mg | ORAL_TABLET | Freq: Every day | ORAL | Status: DC

## 2012-11-20 MED ORDER — POTASSIUM CHLORIDE 2 MEQ/ML FOR ORAL USE: 10.0000 meq | Freq: Every day | ORAL | Status: DC

## 2012-11-20 NOTE — Assessment & Plan Note (Signed)
Reason fall. She denies loss of consciousness. She did have dizziness. Uncertain if secondary to orthostasis, possibly in combination with low potassium, low sodium. Recent Holter monitor showed APCs and PVCs. She does not want a 30 day monitor at this time as she is unable to place the electrodes secondary to right arm fracture.

## 2012-11-20 NOTE — Progress Notes (Signed)
Patient ID: Catherine Holmes, female    DOB: 05-13-1928, 79 y.o.   MRN: [id number]  HPI Comments: 79 year old woman with a history of DM,  peripheral vascular disease, 60 to 70% carotid arterial disease, moderate arterial disease of the lower extremities, status post bilateral iliac stenting, moderate renal stenosis, hyperlipidemia, history of severe  hypertension with labile blood pressures, history of episodic shortness of breath and chest squeezing relieved with albuterol/nebs who presents for routine followup. She has a sulfa allergy to loop diuretics. History of falls. She presents today after dizziness with a subsequent fall.   in the hospital at [facility] in 2012 for general malaise, severe hypertension, shortness of breath, chest pain. She had a stress test, lexiscan that showed no significant ischemia.   hospital admission on 01/08/2012 for shortness of breath and syncope. She was found to have significant anemia. Treated with Lasix for diastolic CHF.  Underlying severe left carotid arterial disease estimated at 70%.   Prior followup with Dr. Welton Flakes in pulmonary with numerous CT scans, MRIs, PFTs, sleep study. She was told that she had hypoxemia overnight and was started on nighttime oxygen.   She is not able to tolerate statins. She does handle zetia without significant side effects. previously encouraged red yeast rice.   History of iron infusion, side effects of  hypertension, malaise, headaches.   Recent episode where she was bending down to get treats to her dogs. She felt dizzy and fell onto her right face and right arm. Fractures to her arms. Laminogram for over one hour called for help. She denies any loss of consciousness. Hospitalization for workup showing very low potassium of 3.2, sodium in the high 120 range. Etiology of her fall was thought secondary to low blood pressure, unable to exclude other etiology. HCTZ was held for low potassium and low sodium.  Discharged to [facility] for  rehabilitation. One episode of chest pain requiring nitroglycerin. She has had a rash on her body since starting Bumex. Previously had sulfa allergy on Lasix. She has been taking in a Benicar, Cardura, and clonidine on an as-needed basis for hypertension. She's not been checking her blood pressure regularly at home. Outpatient 24-hour Holter monitor showing no significant arrhythmia. Some sinus bradycardia with rates in the low 50s, rare APCs, frequent PVCs. No significant edema on Bumex.  Since her event, she reports having problems with talking and her memory. She is uncertain why she continues to have these problems  EKG shows normal sinus rhythm rate 76 beats per minute, no significant ST or T wave changes  Outpatient Encounter Prescriptions as of 11/20/2012  Medication Sig Dispense Refill  . albuterol (PROAIR HFA) 108 (90 BASE) MCG/ACT inhaler Inhale 2 puffs into the lungs every 6 (six) hours as needed.  3 Inhaler  0  . albuterol (PROVENTIL) (2.5 MG/3ML) 0.083% nebulizer solution Take 2.5 mg by nebulization every 6 (six) hours as needed.        Marland Kitchen arformoterol (BROVANA) 15 MCG/2ML NEBU Take 2 mLs (15 mcg total) by nebulization 2 (two) times daily.  120 mL  4  . aspirin 81 MG tablet Take 81 mg by mouth daily.        . beta carotene w/minerals (OCUVITE) tablet Take 1 tablet by mouth 2 (two) times daily.      . bumetanide (BUMEX) 1 MG tablet Take 0.5 mg by mouth daily.      . Cholecalciferol (VITAMIN D-3 PO) Take 1,000 Units by mouth.      Marland Kitchen  citalopram (CELEXA) 20 MG tablet Take 20 mg by mouth daily.      . cloNIDine (CATAPRES) 0.2 MG tablet Take 0.2 mg by mouth 2 (two) times daily as needed.       Marland Kitchen dexlansoprazole (DEXILANT) 60 MG capsule Take 1 capsule (60 mg total) by mouth daily.  90 capsule  0  . doxazosin (CARDURA) 4 MG tablet Take 1 tablet (4 mg total) by mouth at bedtime.  90 tablet  3  . ezetimibe (ZETIA) 10 MG tablet Take 1 tablet (10 mg total) by mouth daily.  90 tablet  3  .  gabapentin (NEURONTIN) 100 MG capsule Take 100 mg by mouth 2 (two) times daily.      Marland Kitchen HYDROcodone-acetaminophen (NORCO/VICODIN) 5-325 MG per tablet Take 1 tablet by mouth 2 (two) times daily.       . magnesium oxide (MAG-OX) 400 MG tablet Take 400 mg by mouth 2 (two) times daily.      . metFORMIN (GLUCOPHAGE) 500 MG tablet Take 500 mg by mouth 2 (two) times daily with a meal.       . Multiple Vitamin (MULTIVITAMIN) tablet Take 1 tablet by mouth daily.        . nitroGLYCERIN (NITROSTAT) 0.4 MG SL tablet Place 1 tablet (0.4 mg total) under the tongue every 5 (five) minutes as needed for chest pain.  25 tablet  3  . olmesartan (BENICAR) 40 MG tablet Take 40 mg by mouth daily.      . Polyethylene Glycol 3350 POWD 17 g by Does not apply route daily as needed.      . tiotropium (SPIRIVA) 18 MCG inhalation capsule Place 1 capsule (18 mcg total) into inhaler and inhale daily.  90 capsule  0  . traMADol (ULTRAM) 50 MG tablet Take 100 mg by mouth every 4 (four) hours as needed.       . [DISCONTINUED] hydrALAZINE (APRESOLINE) 50 MG tablet Take two (50 mg) tablets three times a day.      . [DISCONTINUED] nebivolol (BYSTOLIC) 10 MG tablet Take 1 tablet twice daily  120 tablet  3  . [DISCONTINUED] olmesartan-hydrochlorothiazide (BENICAR HCT) 40-25 MG per tablet Take 1 tablet by mouth daily.  90 tablet  3  . [DISCONTINUED] spironolactone (ALDACTONE) 50 MG tablet Take 1 tablet (50 mg total) by mouth daily.  30 tablet  6     Review of Systems  Constitutional: Negative.        Problems with her memory and speaking  HENT: Negative.   Eyes: Negative.   Respiratory: Negative.   Cardiovascular: Negative.   Gastrointestinal: Negative.   Musculoskeletal: Positive for myalgias, joint swelling, arthralgias and gait problem.  Skin: Negative.   Neurological: Negative.   Hematological: Negative.   Psychiatric/Behavioral: Negative.   All other systems reviewed and are negative.    BP 122/72  Pulse 76  Ht 5'  1" (1.549 m)  Wt 152 lb 12 oz (69.287 kg)  BMI 28.86 kg/m2 She took clonidine 0.1 mg today at noon.  Physical Exam  Nursing note and vitals reviewed. Constitutional: She is oriented to person, place, and time. She appears well-developed and well-nourished.  HENT:  Head: Normocephalic.  Nose: Nose normal.  Mouth/Throat: Oropharynx is clear and moist.  Eyes: Conjunctivae normal are normal. Pupils are equal, round, and reactive to light.  Neck: Normal range of motion. Neck supple. No JVD present. Carotid bruit is present.  Cardiovascular: Normal rate, regular rhythm, S1 normal, S2 normal and intact distal pulses.  Exam reveals no gallop and no friction rub.   Murmur heard.  Crescendo systolic murmur is present with a grade of 2/6  Pulmonary/Chest: Effort normal. No respiratory distress. She has decreased breath sounds. She has no wheezes. She has no rales. She exhibits no tenderness.  Abdominal: Soft. Bowel sounds are normal. She exhibits no distension. There is no tenderness.  Musculoskeletal: Normal range of motion. She exhibits no edema and no tenderness.  Lymphadenopathy:    She has no cervical adenopathy.  Neurological: She is alert and oriented to person, place, and time. Coordination normal.  Skin: Skin is warm and dry. No rash noted. No erythema.  Psychiatric: She has a normal mood and affect. Her behavior is normal. Judgment and thought content normal.         Assessment and Plan

## 2012-11-20 NOTE — Assessment & Plan Note (Signed)
Blood pressures in the 120 range today. She is taking clonidine on an as-needed basis. Uncertain if this is the best as needed medication. I did suggest that perhaps she could try hydralazine 25 mg when necessary instead of clonidine for systolic pressures greater than 170. She'll continue on Benicar 40 mg daily with Cardura 4 mg each bedtime. She is going to stop Bumex as she has what appears to be a sulfa allergy.   If she has worsening lower extremity edema, I suggested she take the Benicar with HCTZ only as needed with potassium 10 mEq.  She would also liberalize her salt intake to avoid repeat low sodium and low potassium situation. She will try to take the HCTZ sparingly.

## 2012-11-20 NOTE — Assessment & Plan Note (Signed)
Occasional episodes of chest tightness, one recently at [facility]. She has had these before and they seem to respond to albuterol, sometimes nitroglycerin. Prior stress test was negative in the setting of similar chest discomfort.

## 2012-11-20 NOTE — Assessment & Plan Note (Signed)
Prior history of diastolic CHF. She will take Benicar with HCTZ sparingly for worsening lower stone edema. Rash on loop diuretics.

## 2012-11-20 NOTE — Patient Instructions (Addendum)
You are doing well. Stop bumex (because of rash)  If blood pressure runs over 170, Take a 1/2 hydralazine pill If it continues to run high, take a clonidine 1/2 pill  Take benicar HCT as needed when you have swelling, but take with a potassium   We will order a 30 day monitor when you are ready.   Please call us if you have new issues that need to be addressed before your next appt.  Your physician wants you to follow-up in: 2 months.  You will receive a reminder letter in the mail two months in advance. If you don't receive a letter, please call our office to schedule the follow-up appointment.

## 2012-11-20 NOTE — Assessment & Plan Note (Signed)
60-70% carotid arterial disease. She does not want a statin. We'll continue periodic ultrasounds.

## 2012-11-21 ENCOUNTER — Telehealth: Payer: Self-pay

## 2012-11-21 NOTE — Telephone Encounter (Signed)
"  call pt to assess her feelings AV:[id number] home health care/eval" VO Dr. Alvis Lemmings, RN

## 2012-11-21 NOTE — Addendum Note (Signed)
Addended by: Sabino Snipes E on: 11/21/2012 10:23 AM   Modules accepted: Orders

## 2012-11-21 NOTE — Telephone Encounter (Signed)
Received reply from Sunday Corn, RN with [facility]. She received referral and has faxed [place] a form to complete. This will be faxed back to [facility] [employer] and process will be started for PT/Nursing Care in Home

## 2012-11-21 NOTE — Telephone Encounter (Signed)
Pt agreeable. Says she is to have PT coming to her home but she has not "heard anything". I will send message to Henderson Newcomer, RN with [facility] to check on status

## 2012-11-21 NOTE — Telephone Encounter (Signed)
Message sent to M. Gust Rung, RN with [facility]

## 2012-12-02 ENCOUNTER — Telehealth: Payer: Self-pay

## 2012-12-02 NOTE — Telephone Encounter (Signed)
I called pt to see if she is ready to schedule EM. She asks about how much it would cost before she commits. I told her I would find out and call her back. Understanding verb.  I called Mitch with [facility] who says it depends on her deductible and if it has been met with supplemental insur He advised I call billing with [facility] [employer] at [phone]

## 2012-12-08 ENCOUNTER — Other Ambulatory Visit: Payer: Self-pay

## 2012-12-08 NOTE — Telephone Encounter (Signed)
Per [facility], pt should be 100% covered with medicare and the supplement she has I will call pt to inform

## 2012-12-08 NOTE — Telephone Encounter (Signed)
Per family, pt is "sick" today and will have her call me back when she is able

## 2012-12-08 NOTE — Telephone Encounter (Signed)
I was able to call [facility] billing dept They will enter pt insurance info and will call me back with estimate

## 2012-12-11 ENCOUNTER — Emergency Department: Payer: Self-pay | Admitting: Emergency Medicine

## 2012-12-11 LAB — URINALYSIS, COMPLETE
Bilirubin,UR: NEGATIVE
Blood: NEGATIVE
Glucose,UR: NEGATIVE mg/dL (ref 0–75)
Nitrite: NEGATIVE
Ph: 6 (ref 4.5–8.0)
Protein: NEGATIVE
RBC,UR: <1 /HPF (ref 0–5)
Specific Gravity: 1.023 (ref 1.003–1.030)
Squamous Epithelial: <1
WBC UR: 3 /HPF (ref 0–5)

## 2012-12-11 LAB — BASIC METABOLIC PANEL
BUN: 13 mg/dL (ref 7–18)
Chloride: 95 mmol/L — ABNORMAL LOW (ref 98–107)
Creatinine: 0.61 mg/dL (ref 0.60–1.30)
EGFR (African American): >60
EGFR (Non-African Amer.): >60
Osmolality: 266 (ref 275–301)

## 2012-12-11 LAB — CBC
HCT: 36.5 % (ref 35.0–47.0)
MCH: 29.2 pg (ref 26.0–34.0)
Platelet: 370 10*3/uL (ref 150–440)
RDW: 14.3 % (ref 11.5–14.5)
WBC: 14.5 10*3/uL — ABNORMAL HIGH (ref 3.6–11.0)

## 2012-12-17 ENCOUNTER — Emergency Department: Payer: Self-pay | Admitting: Unknown Physician Specialty

## 2012-12-22 ENCOUNTER — Telehealth: Payer: Self-pay

## 2012-12-22 NOTE — Telephone Encounter (Signed)
Pt wants me to go ahead and order ecard[employer] monitor Will order this and have mailed to her home Understanding verb

## 2012-12-22 NOTE — Telephone Encounter (Signed)
LMTCB re: ordering ecardio

## 2012-12-30 ENCOUNTER — Telehealth: Payer: Self-pay | Admitting: *Deleted

## 2012-12-30 NOTE — Telephone Encounter (Signed)
Please call daughter concerning her leg swelling. Pt PCP increased her benicar last week and O2 was Zhuri Krass low. Office # [phone] til 4:30

## 2012-12-31 NOTE — Telephone Encounter (Signed)
I spoke with pt's dtr She says pt has developed worsening sob and LE edema Dr. Randa Lynn increased her Benicar/HCT to 1 whole tablet daily last week for the LE edema dtr says this did not help so pt has been taking spironolactone 25 mg daily Says this has helped some but is still having to wear O2 more often Says when she goes without O2, sats=80's Would like to see Dr. Mariah Milling today or tomm I explained he has no openings but I would talk with him and call dtr back at work Understanding verb

## 2012-12-31 NOTE — Telephone Encounter (Signed)
Pt daughter called back this morning, would like to get an appt with Dr Mariah Milling today or tomorrow, but would like to speak with a nurse first.

## 2012-12-31 NOTE — Telephone Encounter (Signed)
I spoke with Dr. Mariah Milling about symptoms He gave orders as follows:"Have pt take Bumex as previously prescribed only PRN edema or worsening sob." VO Dr. Alvis Lemmings, RN  I was able to speak with dtr at pts home She says she had to leave work early d/t pt c/o "tightness in her chest" Says pt tried NTG SL x1 with some relief and is now doing a nebulizer tx I was able to speak with pt who says she is hurting under left breast but is unsure if this is r/t fall/rib pain I asked how long ago pt took Ntg. She took this 1.5 hours ago I explained she can take up to 3 SL NTG within a 15 min. Period but should call 911 after the 3rd NTG if still having pain Pt verb understanding and says tightness has improved slightly since nebulizer tx  Dtr is going to call Dr. Welton Flakes (pulm) today as well Pt going to 2 MD appts today as well  They both verbalize understanding [id number] and how much to take I will call to reassess tomm

## 2012-12-31 NOTE — Telephone Encounter (Signed)
dtr is "gone for the day" at work #

## 2013-01-02 ENCOUNTER — Telehealth: Payer: Self-pay

## 2013-01-02 NOTE — Telephone Encounter (Signed)
I called pt and dtr to inform them of Dr. Windell Hummingbird opinion Pt is very concerned about these episodes of dyspnea and feeling as if there is a "belt around my ribs" at times Has not taken any NTG today but this helps symptoms when she does take it I reassured her stress test was normal in 2012 dtr and pt would like to see Dr. Mariah Milling next Tuesday to discuss In the meantime pt will continue to take SL NTG PRN tightness She will go to ER should discomfort get worse/go unrelieved with NTG

## 2013-01-02 NOTE — Telephone Encounter (Signed)
I received t/c from Dr. Milta Deiters office says pt in their office now with weakness, sob and questionable atrial fib Wants pt seen either today or Monday I explained Dr. Mariah Milling not here this pm, not here Monday but offered appt with Dr. Elease Hashimoto Monday Pt refuses to see anyone else Scheduled with Dr. Mariah Milling for Tuesday

## 2013-01-02 NOTE — Telephone Encounter (Signed)
Dr. Milta Deiters office called back to say pt is willing to see another MD Made appt for Monday with Dr. Elease Hashimoto Nurse then tells me they have EKG that was performed in their office today that shows pt to be in atrial fib They ask if they should tx for this I explained we will handle and call pt with instructions I asked that they fax EKG. They tell me pt is going to drop this off Will await EKG and will discuss with MD to see what we need to change

## 2013-01-02 NOTE — Telephone Encounter (Signed)
Reviewed EKG with Dr. Mariah Milling He agrees this is NSR with PACs and PVCs He does not feel pt should see Dr. Elease Hashimoto as a "urgent" appt  Should be seen as scheduled with Dr. Mariah Milling

## 2013-01-02 NOTE — Telephone Encounter (Signed)
Reassess sob

## 2013-01-02 NOTE — Telephone Encounter (Signed)
I was able to speak with Gavin Pound, pt's dtr She says they are leaving now to go see Dr. Welton Flakes (pulm) Overall, pt is doing "a little better"

## 2013-01-02 NOTE — Telephone Encounter (Signed)
lmtcb on home # 

## 2013-01-02 NOTE — Telephone Encounter (Signed)
Error

## 2013-01-05 ENCOUNTER — Ambulatory Visit: Payer: Medicare Other | Admitting: Cardiovascular Disease

## 2013-01-06 ENCOUNTER — Encounter: Payer: Self-pay | Admitting: Cardiovascular Disease

## 2013-01-06 ENCOUNTER — Ambulatory Visit (INDEPENDENT_AMBULATORY_CARE_PROVIDER_SITE_OTHER): Payer: Medicare Other | Admitting: Cardiovascular Disease

## 2013-01-06 ENCOUNTER — Ambulatory Visit: Payer: Medicare Other | Admitting: Cardiovascular Disease

## 2013-01-06 VITALS — BP 100/72 | HR 103 | Ht 61.0 in | Wt 149.0 lb

## 2013-01-06 DIAGNOSIS — E119 Type 2 diabetes mellitus without complications: Secondary | ICD-10-CM

## 2013-01-06 DIAGNOSIS — J449 Chronic obstructive pulmonary disease, unspecified: Secondary | ICD-10-CM

## 2013-01-06 DIAGNOSIS — I10 Essential (primary) hypertension: Secondary | ICD-10-CM

## 2013-01-06 DIAGNOSIS — R079 Chest pain, unspecified: Secondary | ICD-10-CM

## 2013-01-06 DIAGNOSIS — I6529 Occlusion and stenosis of unspecified carotid artery: Secondary | ICD-10-CM

## 2013-01-06 DIAGNOSIS — R0789 Other chest pain: Secondary | ICD-10-CM

## 2013-01-06 MED ORDER — NITROGLYCERIN 0.4 MG SL SUBL: 0.4000 mg | SUBLINGUAL_TABLET | SUBLINGUAL | Status: DC | PRN

## 2013-01-06 NOTE — Assessment & Plan Note (Addendum)
She's not interested in a statin. 60-70% disease. Repeat ultrasound on next followup

## 2013-01-06 NOTE — Assessment & Plan Note (Signed)
Followed by pulmonary. Stable on albuterol, Dulera, nebulizers and Spiriva

## 2013-01-06 NOTE — Patient Instructions (Addendum)
You are doing well. No medication changes were made.  Please call us if you have new issues that need to be addressed before your next appt.  Your physician wants you to follow-up in: 3 months You will receive a reminder letter in the mail two months in advance. If you don't receive a letter, please call our office to schedule the follow-up appointment.   

## 2013-01-06 NOTE — Assessment & Plan Note (Signed)
Blood pressure well controlled on her regimen. We did suggest if she continues to have tachycardia, she could take low-dose bystolic and hold her hydralazine.

## 2013-01-06 NOTE — Progress Notes (Signed)
Patient ID: Catherine Holmes, female    DOB: [birth year], 79 y.o.   MRN: [id number]  HPI Comments: 79 year old woman with a history of DM,  peripheral vascular disease, 60 to 70% carotid arterial disease, moderate arterial disease of the lower extremities, status post bilateral iliac stenting, moderate renal stenosis, hyperlipidemia, history of severe  hypertension with labile blood pressures, history of episodic shortness of breath and chest squeezing relieved with albuterol/nebs who presents for routine followup. She has a sulfa allergy to loop diuretics. History of falls. She presents today after dizziness with a subsequent fall.   in the hospital at [facility] in 2012 for general malaise, severe hypertension, shortness of breath, chest pain. She had a stress test, lexiscan that showed no significant ischemia. Significant shortness of breath with lexiscan.   hospital admission on 01/08/2012 for shortness of breath and syncope. She was found to have significant anemia. Treated with Lasix for diastolic CHF.  Underlying severe left carotid arterial disease estimated at 70%.   Prior followup with Dr. Welton Flakes in pulmonary with numerous CT scans, MRIs, PFTs, sleep study. She was told that she had hypoxemia overnight and was started on nighttime oxygen.   She is not able to tolerate statins. She does handle zetia without significant side effects. previously encouraged to take red yeast rice.   History of iron infusion, side effects of  hypertension, malaise, headaches.   Recent episode where she was bending down to get treats to her dogs. She felt dizzy and fell onto her right face and right arm. Fractures to her arms. Hospitalization for workup showing very low potassium of 3.2, sodium in the high 120 range. Etiology of her fall was thought secondary to low blood pressure, unable to exclude other etiology. HCTZ was held for low potassium and low sodium.  Discharged to [facility] for rehabilitation.   Hx of  rash on  her body since starting Bumex. Previously had sulfa allergy on Lasix.  In followup today, she reports havingRelatively well-controlled blood pressure. She's not taking clonidine, she takes one half Benicar, sometimes a full pill, takes hydralazine 25 mg 4 times a day, Cardura 4 mg at nighttime. She has had episodes of chest pain requiring nitroglycerin. Chest pain comes on at rest, feels like a tightness in her chest that is relieved with albuterol. She has noticed tachycardia with walking it returns to normal rate after resting. She is walking without oxygen. Since her event, she reports having problems with talking and her memory. She is uncertain why she continues to have these problems  EKG shows normal sinus rhythm rate 103 beats per minute, no significant ST or T wave changes  Outpatient Encounter Prescriptions as of 01/06/2013  Medication Sig Dispense Refill  . albuterol (PROAIR HFA) 108 (90 BASE) MCG/ACT inhaler Inhale 2 puffs into the lungs every 6 (six) hours as needed.  3 Inhaler  0  . albuterol (PROVENTIL) (2.5 MG/3ML) 0.083% nebulizer solution Take 2.5 mg by nebulization every 6 (six) hours as needed.        Marland Kitchen arformoterol (BROVANA) 15 MCG/2ML NEBU Take 2 mLs (15 mcg total) by nebulization 2 (two) times daily.  120 mL  4  . aspirin 81 MG tablet Take 81 mg by mouth daily.        . beta carotene w/minerals (OCUVITE) tablet Take 1 tablet by mouth 2 (two) times daily.      . Cholecalciferol (VITAMIN D-3 PO) Take 1,000 Units by mouth.      Marland Kitchen  citalopram (CELEXA) 20 MG tablet Take 20 mg by mouth daily.      . cloNIDine (CATAPRES) 0.2 MG tablet Take 0.2 mg by mouth 2 (two) times daily as needed.       Marland Kitchen dexlansoprazole (DEXILANT) 60 MG capsule Take 1 capsule (60 mg total) by mouth daily.  90 capsule  0  . doxazosin (CARDURA) 4 MG tablet Take 1 tablet (4 mg total) by mouth at bedtime.  90 tablet  3  . ezetimibe (ZETIA) 10 MG tablet Take 1 tablet (10 mg total) by mouth daily.  90 tablet  3  .  gabapentin (NEURONTIN) 100 MG capsule Take 100 mg by mouth 2 (two) times daily.      . hydrALAZINE (APRESOLINE) 25 MG tablet Take 25 mg by mouth 4 (four) times daily.      Marland Kitchen HYDROcodone-acetaminophen (NORCO/VICODIN) 5-325 MG per tablet Take 1 tablet by mouth as needed.       . metFORMIN (GLUCOPHAGE) 500 MG tablet Take 500 mg by mouth 2 (two) times daily with a meal.       . mometasone-formoterol (DULERA) 100-5 MCG/ACT AERO Inhale 2 puffs into the lungs.      . Multiple Vitamin (MULTIVITAMIN) tablet Take 1 tablet by mouth daily.        . nitroGLYCERIN (NITROSTAT) 0.4 MG SL tablet Place 1 tablet (0.4 mg total) under the tongue every 5 (five) minutes as needed for chest pain.  25 tablet  3  . olmesartan-hydrochlorothiazide (BENICAR HCT) 20-12.5 MG per tablet Takes 1- 1/2 tablet daily.      Marland Kitchen olmesartan-hydrochlorothiazide (BENICAR HCT) 40-25 MG per tablet Take 1/2 tablet daily.      Marland Kitchen tiotropium (SPIRIVA) 18 MCG inhalation capsule Place 1 capsule (18 mcg total) into inhaler and inhale daily.  90 capsule  0  . traMADol (ULTRAM) 50 MG tablet Take 100 mg by mouth every 4 (four) hours as needed.       . [DISCONTINUED] bumetanide (BUMEX) 1 MG tablet Take 0.5 mg by mouth daily.      . [DISCONTINUED] nitroGLYCERIN (NITROSTAT) 0.4 MG SL tablet Place 1 tablet (0.4 mg total) under the tongue every 5 (five) minutes as needed for chest pain.  25 tablet  3  . [DISCONTINUED] hydrALAZINE (APRESOLINE) 50 MG tablet Take 50 mg by mouth 2 (two) times daily as needed.      . [DISCONTINUED] magnesium oxide (MAG-OX) 400 MG tablet Take 400 mg by mouth 2 (two) times daily.      . [DISCONTINUED] olmesartan (BENICAR) 40 MG tablet Take 1 tablet (40 mg total) by mouth daily.  90 tablet  3  . [DISCONTINUED] Polyethylene Glycol 3350 POWD 17 g by Does not apply route daily as needed.      . [DISCONTINUED] potassium chloride (KCL) 2 mEq/mL SOLN oral liquid Take 5 mLs (10 mEq total) by mouth daily.  250 mL  3   No  facility-administered encounter medications on file as of 01/06/2013.     Review of Systems  Constitutional: Negative.        Problems with her memory and speaking  HENT: Negative.   Eyes: Negative.   Respiratory: Negative.   Cardiovascular: Positive for chest pain.  Gastrointestinal: Negative.   Musculoskeletal: Positive for myalgias, joint swelling, arthralgias and gait problem.  Skin: Negative.   Neurological: Negative.   Psychiatric/Behavioral: Negative.   All other systems reviewed and are negative.    BP 100/72  Pulse 103  Ht 5\' 1"  (1.549 m)  Wt 149 lb (67.586 kg)  BMI 28.17 kg/m2 Repeat blood pressure check 120/70  Physical Exam  Nursing note and vitals reviewed. Constitutional: She is oriented to person, place, and time. She appears well-developed and well-nourished.  HENT:  Head: Normocephalic.  Nose: Nose normal.  Mouth/Throat: Oropharynx is clear and moist.  Eyes: Conjunctivae are normal. Pupils are equal, round, and reactive to light.  Neck: Normal range of motion. Neck supple. No JVD present. Carotid bruit is present.  Cardiovascular: Normal rate, regular rhythm, S1 normal, S2 normal and intact distal pulses.  Exam reveals no gallop and no friction rub.   Murmur heard.  Crescendo systolic murmur is present with a grade of 2/6  Pulmonary/Chest: Effort normal. No respiratory distress. She has decreased breath sounds. She has no wheezes. She has no rales. She exhibits no tenderness.  Abdominal: Soft. Bowel sounds are normal. She exhibits no distension. There is no tenderness.  Musculoskeletal: Normal range of motion. She exhibits no edema and no tenderness.  Lymphadenopathy:    She has no cervical adenopathy.  Neurological: She is alert and oriented to person, place, and time. Coordination normal.  Skin: Skin is warm and dry. No rash noted. No erythema.  Psychiatric: She has a normal mood and affect. Her behavior is normal. Judgment and thought content normal.     Assessment and Plan

## 2013-01-06 NOTE — Assessment & Plan Note (Signed)
We have encouraged continued careful diet management in an effort to lose weight.  

## 2013-01-06 NOTE — Assessment & Plan Note (Signed)
Rare episodes of chest pain. Relief with nitroglycerin, also albuterol. Symptoms at rest, not with exertion. She has had these in the past there likely noncardiac. We have asked her to call us if symptoms occur more frequently, particularly with exertion.

## 2013-01-07 ENCOUNTER — Encounter: Payer: Self-pay | Admitting: *Deleted

## 2013-01-19 ENCOUNTER — Ambulatory Visit: Payer: Medicare Other | Admitting: Cardiovascular Disease

## 2013-01-20 ENCOUNTER — Ambulatory Visit: Payer: Self-pay | Admitting: Internal Medicine

## 2013-02-25 ENCOUNTER — Ambulatory Visit: Payer: Self-pay | Admitting: Ophthalmology

## 2013-03-04 ENCOUNTER — Other Ambulatory Visit: Payer: Self-pay | Admitting: Cardiovascular Disease

## 2013-03-05 ENCOUNTER — Other Ambulatory Visit: Payer: Self-pay

## 2013-03-05 ENCOUNTER — Telehealth: Payer: Self-pay | Admitting: *Deleted

## 2013-03-05 MED ORDER — SPIRONOLACTONE 50 MG PO TABS: ORAL_TABLET | ORAL | Status: DC

## 2013-03-05 NOTE — Telephone Encounter (Signed)
spironolactone 50 mg needs to be sent to [facility]

## 2013-03-05 NOTE — Telephone Encounter (Signed)
Pt was started on spironolactone 01/29/12 to take PRN. She has been taking this PRN and needs refill When she saw Dr. Mariah Milling 06/2012 she was still taking this but then at office visit 10/2012 it was taken off list Pt still taking PRN RX refill to be sent in to pharmacy I will make Dr. Mariah Milling aware she is still taking

## 2013-03-16 ENCOUNTER — Telehealth: Payer: Self-pay | Admitting: Internal Medicine

## 2013-03-16 LAB — CK TOTAL AND CKMB (NOT AT ARMC)
CK, Total: 93 U/L (ref 21–215)
CK-MB: 2.9 ng/mL (ref 0.5–3.6)

## 2013-03-16 LAB — COMPREHENSIVE METABOLIC PANEL
Albumin: 3.4 g/dL (ref 3.4–5.0)
Anion Gap: 6 — ABNORMAL LOW (ref 7–16)
BUN: 13 mg/dL (ref 7–18)
Calcium, Total: 8.6 mg/dL (ref 8.5–10.1)
Chloride: 95 mmol/L — ABNORMAL LOW (ref 98–107)
Co2: 29 mmol/L (ref 21–32)
Creatinine: 0.57 mg/dL — ABNORMAL LOW (ref 0.60–1.30)
EGFR (African American): >60
EGFR (Non-African Amer.): >60
Glucose: 129 mg/dL — ABNORMAL HIGH (ref 65–99)
SGOT(AST): 42 U/L — ABNORMAL HIGH (ref 15–37)
SGPT (ALT): 51 U/L (ref 12–78)
Total Protein: 6.6 g/dL (ref 6.4–8.2)

## 2013-03-16 LAB — CBC WITH DIFFERENTIAL/PLATELET
Basophil #: 0.1 10*3/uL (ref 0.0–0.1)
Eosinophil #: 0.1 10*3/uL (ref 0.0–0.7)
Eosinophil %: 1.1 %
HCT: 26.1 % — ABNORMAL LOW (ref 35.0–47.0)
HGB: 8.4 g/dL — ABNORMAL LOW (ref 12.0–16.0)
MCHC: 32.3 g/dL (ref 32.0–36.0)
MCV: 80 fL (ref 80–100)
Monocyte %: 9.7 %
Neutrophil #: 5.7 10*3/uL (ref 1.4–6.5)
Neutrophil %: 73.4 %
Platelet: 353 10*3/uL (ref 150–440)
WBC: 7.8 10*3/uL (ref 3.6–11.0)

## 2013-03-16 NOTE — Telephone Encounter (Signed)
Pt called tonight reporting that she is SOB, has worsening LE edema (she is taking spironolactone, reports itching with lasix) and uncontrolled BP - systolics 210s tonight.  Over the phone, she cannot complete full sentence without stopping due to SOB.  She reports that she wears home oxygen.  Advised pt to come to ED for evaluation.

## 2013-03-17 ENCOUNTER — Inpatient Hospital Stay: Payer: Self-pay | Admitting: Internal Medicine

## 2013-03-17 LAB — RETICULOCYTES: Absolute Retic Count: 0.077 10*6/uL

## 2013-03-17 LAB — APTT: Activated PTT: 30.3 secs (ref 23.6–35.9)

## 2013-03-17 LAB — PROTIME-INR
INR: 1
Prothrombin Time: 13.4 secs (ref 11.5–14.7)

## 2013-03-17 LAB — HEMOGLOBIN: HGB: 8.3 g/dL — ABNORMAL LOW (ref 12.0–16.0)

## 2013-03-17 LAB — IRON AND TIBC
Iron Bind.Cap.(Total): 431 ug/dL (ref 250–450)
Iron: 23 ug/dL — ABNORMAL LOW (ref 50–170)
Unbound Iron-Bind.Cap.: 408 ug/dL

## 2013-03-17 NOTE — Telephone Encounter (Signed)
Can recall to followup on patient Did she stay in hospital or discharge home Lowering blood pressure may help breathing and fluid Cut back on salt and fluid intake Let us know which medications she is currently taking Typically we use extra hydralazine for blood pressure

## 2013-03-17 NOTE — Telephone Encounter (Signed)
Pt admitted

## 2013-03-18 DIAGNOSIS — I509 Heart failure, unspecified: Secondary | ICD-10-CM

## 2013-03-18 DIAGNOSIS — I5033 Acute on chronic diastolic (congestive) heart failure: Secondary | ICD-10-CM

## 2013-03-18 LAB — BASIC METABOLIC PANEL
Anion Gap: 5 — ABNORMAL LOW (ref 7–16)
Creatinine: 0.71 mg/dL (ref 0.60–1.30)
EGFR (Non-African Amer.): >60
Osmolality: 264 (ref 275–301)

## 2013-03-18 LAB — MAGNESIUM: Magnesium: 0.9 mg/dL — ABNORMAL LOW

## 2013-03-18 LAB — HEMOGLOBIN: HGB: 8.7 g/dL — ABNORMAL LOW (ref 12.0–16.0)

## 2013-03-19 LAB — BASIC METABOLIC PANEL
BUN: 15 mg/dL (ref 7–18)
Calcium, Total: 8.7 mg/dL (ref 8.5–10.1)
Co2: 38 mmol/L — ABNORMAL HIGH (ref 21–32)
Creatinine: 0.75 mg/dL (ref 0.60–1.30)
EGFR (African American): >60
EGFR (Non-African Amer.): >60
Glucose: 112 mg/dL — ABNORMAL HIGH (ref 65–99)
Osmolality: 262 (ref 275–301)
Sodium: 130 mmol/L — ABNORMAL LOW (ref 136–145)

## 2013-03-19 LAB — MAGNESIUM: Magnesium: 1.8 mg/dL

## 2013-03-19 LAB — PRO B NATRIURETIC PEPTIDE: B-Type Natriuretic Peptide: 1581 pg/mL — ABNORMAL HIGH (ref 0–450)

## 2013-03-20 LAB — CBC WITH DIFFERENTIAL/PLATELET
Basophil #: 0.1 10*3/uL (ref 0.0–0.1)
Basophil %: 0.7 %
HCT: 26.7 % — ABNORMAL LOW (ref 35.0–47.0)
Lymphocyte #: 1.6 10*3/uL (ref 1.0–3.6)
Lymphocyte %: 21.7 %
MCH: 26.1 pg (ref 26.0–34.0)
MCHC: 33.4 g/dL (ref 32.0–36.0)
MCV: 78 fL — ABNORMAL LOW (ref 80–100)
Monocyte #: 0.8 x10 3/mm (ref 0.2–0.9)
Neutrophil %: 64.9 %
Platelet: 382 10*3/uL (ref 150–440)
RBC: 3.42 10*6/uL — ABNORMAL LOW (ref 3.80–5.20)
RDW: 16.6 % — ABNORMAL HIGH (ref 11.5–14.5)
WBC: 7.3 10*3/uL (ref 3.6–11.0)

## 2013-03-20 LAB — BASIC METABOLIC PANEL
Anion Gap: 3 — ABNORMAL LOW (ref 7–16)
Chloride: 91 mmol/L — ABNORMAL LOW (ref 98–107)
Co2: 35 mmol/L — ABNORMAL HIGH (ref 21–32)
Creatinine: 0.7 mg/dL (ref 0.60–1.30)
EGFR (Non-African Amer.): >60
Glucose: 101 mg/dL — ABNORMAL HIGH (ref 65–99)
Potassium: 4.2 mmol/L (ref 3.5–5.1)
Sodium: 129 mmol/L — ABNORMAL LOW (ref 136–145)

## 2013-03-21 DIAGNOSIS — I5031 Acute diastolic (congestive) heart failure: Secondary | ICD-10-CM

## 2013-03-21 LAB — BASIC METABOLIC PANEL
BUN: 11 mg/dL (ref 7–18)
Co2: 35 mmol/L — ABNORMAL HIGH (ref 21–32)
Creatinine: 0.51 mg/dL — ABNORMAL LOW (ref 0.60–1.30)
EGFR (African American): >60
EGFR (Non-African Amer.): >60
Glucose: 110 mg/dL — ABNORMAL HIGH (ref 65–99)
Potassium: 4.5 mmol/L (ref 3.5–5.1)

## 2013-03-21 LAB — CLOSTRIDIUM DIFFICILE BY PCR

## 2013-03-21 LAB — MAGNESIUM: Magnesium: 1.1 mg/dL — ABNORMAL LOW

## 2013-03-21 LAB — OCCULT BLOOD X 1 CARD TO LAB, STOOL: Occult Blood, Feces: NEGATIVE

## 2013-03-23 ENCOUNTER — Telehealth: Payer: Self-pay

## 2013-03-23 NOTE — Telephone Encounter (Signed)
Message copied by Marcelle Overlie on Mon Mar 23, 2013  9:42 AM ------      Message from: [place] E      Created: Mon Mar 23, 2013  9:41 AM      Regarding: tcm        tcm f/u patient being discharged today. ------

## 2013-03-23 NOTE — Telephone Encounter (Signed)
TCM D/c today 6/2 Will attempt TCM #1 6/3

## 2013-03-24 NOTE — Telephone Encounter (Signed)
Patient contacted regarding discharge from [facility] on 03/23/13.  Patient understands to follow up with provider Alinda Money, PA on 04/03/13 at 2:30 pm at [place] office. Patient understands discharge instructions? yes Patient understands medications and regiment? yes Patient understands to bring all medications to this visit? yes  dtr had some questions re:RX received. They received RX for mag oxide, ferrous sulfate and KCL 10 meq. She asks what these are for. After reviewing d/c summary, I explained these are correct RX and she should be taking these as prescribed, explaining purpose of each. Pt denies worsening symptoms of CHF and feels "much better than she did 1 week ago!"

## 2013-03-27 ENCOUNTER — Other Ambulatory Visit: Payer: Self-pay | Admitting: Cardiovascular Disease

## 2013-03-27 NOTE — Telephone Encounter (Signed)
Refilled Cardura sent to [facility] pharmacy.

## 2013-04-03 ENCOUNTER — Ambulatory Visit (INDEPENDENT_AMBULATORY_CARE_PROVIDER_SITE_OTHER): Payer: Medicare Other | Admitting: Physician Assistant

## 2013-04-03 ENCOUNTER — Encounter: Payer: Self-pay | Admitting: Physician Assistant

## 2013-04-03 VITALS — BP 122/64 | HR 74 | Ht 61.0 in | Wt 159.2 lb

## 2013-04-03 DIAGNOSIS — R195 Other fecal abnormalities: Secondary | ICD-10-CM

## 2013-04-03 DIAGNOSIS — R0789 Other chest pain: Secondary | ICD-10-CM

## 2013-04-03 DIAGNOSIS — R0602 Shortness of breath: Secondary | ICD-10-CM

## 2013-04-03 DIAGNOSIS — I5032 Chronic diastolic (congestive) heart failure: Secondary | ICD-10-CM

## 2013-04-03 NOTE — Patient Instructions (Addendum)
Continue to take your current medications including Bumex and bisoprolol.  Please taper off your steroids as prescribed.   Please limit fluid intake (less than 2 Liters per day) and salt (less than 2 gram sodium per day).  We will schedule a carotid artery ultrasound.

## 2013-04-03 NOTE — Progress Notes (Signed)
Patient ID: Catherine Holmes, female   DOB: [birth year], 79 y.o.   MRN: [id number]            Date:  04/03/2013   ID:  Catherine Holmes, DOB [birth year], MRN [id number]  PCP:  Virl Cagey, MD  Primary Cardiologist:  Concha Se, MD   History of Present Illness: Catherine Holmes is a 79 y.o. female with PMHx s/f chronic respiratory failure, HRpEF (EF 74% on 2012 Myoview), COPD (nighttime home O2), PVD (s/p iliac stent), carotid artery disease, (R 40-59%, L 60-79%) type 2 DM, HTN, HLD, GERD and h/o Sjogren's syndrome who presents today for post-hospital follow-up.   She was admitted at [facility] on 5/27 to 03/23/13 for A/C resp failure secondary to A/C diastolic CHF. She has a history of sulfa allergy on Lasix. She presented to the ED c/o worsening shortness of breath, edema and elevated BP (227/102). She had hesitated to take outpatient Bumex due to urinary incontinence. Her daughter (caregiver) was also out of town. She was noted to be anemic (Hgb 8) and FOBT returned positive. She was seen by GI-- EGD/c-scopy was deferred until cardiology eval for sedation was made and after respiratory improvement. She was discharged on low-dose Bumex, tolerated well, and bisoprolol for BP control.   Her respiratory status has remained stable since discharge. She continues to wear night time O2. She does have a follow-up with her pulmonologist next week, per patient (Dr. Welton Flakes). She has NYHA class II-III symptoms at baseline (although difficult to accurately determine given limited mobility from arthralgias/gait disturbance). She denies PND or LE edema. She has chronic 2 pillow orthopnea. Of note, she has recently been placed on a short steroid taper for stomatitis/mouth ulcers. She has attempted to limit her fluid intake during this time.   She does report a chronic, intermittent band-like upper abdominal tightness radiating to her lower substernum occurring both at rest and with exertion, lasting from minutes to hours. Denies  relation to meals. No associated symptoms. No radiation to jaw or left arm. No NTG use. She had stopped Dexilant for GERD in the past as reflux symptoms improved sitting upright in a recliner. Prior 2011 EGD showed a Schatzki's ring with mild gastritis. GI consult last admission suspected possible passive hepatic congestion from CHF. H/o cholecystectomy.  EKG: NSR, 70 bpm, frequent PACs, Q waves V1, V2 (old), no ST/T changes  Wt Readings from Last 3 Encounters:  04/03/13 159 lb 4 oz (72.235 kg)  01/06/13 149 lb (67.586 kg)  11/20/12 152 lb 12 oz (69.287 kg)     Past Medical History  Diagnosis Date  . Hypertension   . Diabetes mellitus   . COPD (chronic obstructive pulmonary disease)   . Osteoporosis   . Carotid arterial disease     a. 2012 Carotid dopplers: 40-59% R ICA, 60-79% L ICA  . Tic douloureux   . Arthralgia   . Sjoegren syndrome   . TIA (transient ischemic attack)   . Emphysema   . MVP (mitral valve prolapse)   . CHF (congestive heart failure)   . Acid reflux   . Diverticulitis   . Anemia   . History of pneumonia   . Syncope and collapse   . Chronic diastolic CHF (congestive heart failure)     a. EF 55%    Current Outpatient Prescriptions  Medication Sig Dispense Refill  . albuterol (PROAIR HFA) 108 (90 BASE) MCG/ACT inhaler Inhale 2 puffs into the lungs every 6 (six) hours as  needed.  3 Inhaler  0  . albuterol (PROVENTIL) (2.5 MG/3ML) 0.083% nebulizer solution Take 2.5 mg by nebulization every 6 (six) hours as needed.        Marland Kitchen arformoterol (BROVANA) 15 MCG/2ML NEBU Take 2 mLs (15 mcg total) by nebulization 2 (two) times daily.  120 mL  4  . aspirin 81 MG tablet Take 81 mg by mouth daily.        . beta carotene w/minerals (OCUVITE) tablet Take 1 tablet by mouth 2 (two) times daily.      . bumetanide (BUMEX) 2 MG tablet Take 1 mg by mouth daily.      . Cholecalciferol (VITAMIN D-3 PO) Take 1,000 Units by mouth.      . citalopram (CELEXA) 20 MG tablet Take 20 mg by  mouth daily.      Marland Kitchen dexlansoprazole (DEXILANT) 60 MG capsule Take 1 capsule (60 mg total) by mouth daily.  90 capsule  0  . doxazosin (CARDURA) 4 MG tablet TAKE 1 TABLET (4 MG TOTAL) BY MOUTH AT BEDTIME.  90 tablet  3  . ezetimibe (ZETIA) 10 MG tablet Take 1 tablet (10 mg total) by mouth daily.  90 tablet  3  . gabapentin (NEURONTIN) 100 MG capsule Take 100 mg by mouth 2 (two) times daily.      . hydrALAZINE (APRESOLINE) 25 MG tablet Take 25 mg by mouth 4 (four) times daily.      Marland Kitchen HYDROcodone-acetaminophen (NORCO/VICODIN) 5-325 MG per tablet Take 1 tablet by mouth as needed.       . Magnesium 200 MG TABS Take 200 mg by mouth daily.      . metFORMIN (GLUCOPHAGE) 500 MG tablet Take 500 mg by mouth 2 (two) times daily with a meal.       . mometasone-formoterol (DULERA) 100-5 MCG/ACT AERO Inhale 2 puffs into the lungs.      . Multiple Vitamin (MULTIVITAMIN) tablet Take 1 tablet by mouth daily.        . nebivolol (BYSTOLIC) 5 MG tablet Take 5 mg by mouth daily.      . nitroGLYCERIN (NITROSTAT) 0.4 MG SL tablet Place 1 tablet (0.4 mg total) under the tongue every 5 (five) minutes as needed for chest pain.  25 tablet  3  . olmesartan (BENICAR) 40 MG tablet Take 40 mg by mouth daily.      . potassium chloride (K-DUR,KLOR-CON) 10 MEQ tablet Take 5 mEq by mouth 2 (two) times daily.      Marland Kitchen spironolactone (ALDACTONE) 50 MG tablet Take 1 tablet daily as needed  90 tablet  3  . tiotropium (SPIRIVA) 18 MCG inhalation capsule Place 1 capsule (18 mcg total) into inhaler and inhale daily.  90 capsule  0  . traMADol (ULTRAM) 50 MG tablet Take 100 mg by mouth every 4 (four) hours as needed.        No current facility-administered medications for this visit.    Allergies:    Allergies  Allergen Reactions  . Atorvastatin   . Cefaclor   . Codeine   . Doxycycline   . Epinephrine Itching  . Erythromycin   . Fluticasone-Salmeterol   . Glipizide   . Levofloxacin   . Lisinopril   . Penicillins   .  Propoxyphene Hcl   . Rosuvastatin   . Simvastatin   . Singlet (Chlorphen-Pe-Acetaminophen) Other (See Comments)    Wheezing.  . Sulfamethoxazole W-Trimethoprim   . Teriparatide     Social History:  The patient  reports that she quit smoking about 12 years ago. Her smoking use included Cigarettes. She has a 25 pack-year smoking history. She has never used smokeless tobacco. She reports that she does not drink alcohol or use illicit drugs.   Family History:  Family History  Problem Relation Age of Onset  . Heart attack Mother   . Heart attack Father   . Heart attack Sister     Review of Systems: General: negative for chills, fever, night sweats or weight changes.  Cardiovascular: positive for chest pain, dyspnea on exertion, shortness of breath, orthopnea, negative for edema, palpitations, paroxysmal nocturnal dyspnea Dermatological: negative for rash Respiratory: negative for cough or wheezing Urologic: negative for hematuria Abdominal: positive for abdominal pain, negative for nausea, vomiting, diarrhea, bright red blood per rectum, melena, or hematemesis Neurologic: negative for visual changes, syncope, or dizziness Musculoskeletal: positive for arthralgias, myalgias and gait disturbance All other systems reviewed and are otherwise negative except as noted above.  PHYSICAL EXAM: VS:  BP 122/64  Pulse 74  Ht 5\' 1"  (1.549 m)  Wt 159 lb 4 oz (72.235 kg)  BMI 30.11 kg/m2 Well nourished, well developed elderly appearing female in no acute distress HEENT: normal Neck: no JVD, bilateral carotid bruits, L>R Cardiac:  normal S1, S2 II/VI systolic crescendo-decrescendo murmur at RUSB,; RRR, frequent premature beats Lungs: distant breath sounds, no appreciable wheezing, rhonchi or rales Abd: soft, nontender, no hepatomegaly, normoactive BS x 4 quads Ext: no edema, cyanosis or clubbing Skin: warm and dry, cap refill < 2 sec Neuro:  CNs 2-12 intact, no focal abnormalities  noted Psych: normal affect and mood Musculoskeletal: normal strength and tone for age

## 2013-04-05 DIAGNOSIS — R195 Other fecal abnormalities: Secondary | ICD-10-CM | POA: Insufficient documentation

## 2013-04-05 DIAGNOSIS — I5032 Chronic diastolic (congestive) heart failure: Secondary | ICD-10-CM | POA: Insufficient documentation

## 2013-04-05 NOTE — Assessment & Plan Note (Addendum)
This is a known chronic problem. Her symptom is somewhat vague- bandlike abdominal/lower substernal chest tightness lasting for hours at times and occuring at random. Normal stress test 2012. She did not tolerate a prior Lexiscan Myoview well due to shortness of breath (underlying COPD). Next option for stress would be a dobutamine stress agent, however clinical suspicion is not high enough to warrant this. She has follow-up with Dr. Mariah Milling next week where further consideration of this can be revisited at that time. Encouraged to take NTG SL PRN for chest pain and to help rule in/out an ischemic cause; however favor a GI etiology (ddx- GERD, hepatic congestion from CHF, gastritis or PUD with + FOBT).

## 2013-04-05 NOTE — Assessment & Plan Note (Signed)
Bilateral carotid bruits on exam. She has known bilateral carotid artery disease.Marland Kitchen Has not had repeat carotid u/s performed this year. Will arrange.

## 2013-04-05 NOTE — Assessment & Plan Note (Signed)
Patient seen by GI last admission. Tentative plans for EGD/colonoscopy; however clearance for sedation from a cardiology standpoint requested. Suspect her underlying COPD/chronic respiratory failure and poor pulmonary reserve over CHF will need to be considered. She is euvolemic on exam today. She will be seen again next week by Dr. Mariah Milling while off steroid taper where volume status can again be assessed. She has follow-up with her pulmonologist next week, per patient, where sedation for endoscopy can be discussed as well.

## 2013-04-05 NOTE — Assessment & Plan Note (Signed)
Respiratory status has remained at baseline since discharge-- NYHA II-III. Volume management limited by multiple medication intolerances including loop diuretics in the past. She had tolerated low-dose Bumex well. Will continue at current dosing. Discussed non-pharmacologic means of CHF management including salt, fluid restriction, symptom/weight monitoring and compression stockings/leg elevation. She will continue to fluid restrict until prednisone burst completed (over weekend).

## 2013-04-06 ENCOUNTER — Telehealth: Payer: Self-pay

## 2013-04-06 NOTE — Telephone Encounter (Signed)
Needs order faxed for carotid ultrasound they are doing on this Wednesday . She states it needs to be on a prescription pad Fax [phone]

## 2013-04-07 NOTE — Telephone Encounter (Signed)
Faxed order to [facility] [phone]

## 2013-04-10 ENCOUNTER — Other Ambulatory Visit: Payer: Self-pay

## 2013-04-10 DIAGNOSIS — I509 Heart failure, unspecified: Secondary | ICD-10-CM

## 2013-04-10 DIAGNOSIS — I5032 Chronic diastolic (congestive) heart failure: Secondary | ICD-10-CM

## 2013-04-10 NOTE — Telephone Encounter (Signed)
Pt states her legs and feet are swollen, does not want to end up in the hospital, also states she is not "going to the bathroom like I should". Please call

## 2013-04-10 NOTE — Telephone Encounter (Signed)
Pt has had a 3 lb wt gain in 2 days with increased SOB and LE edema, pt had chinese food yesterday, sodium restriction reviewed and advised of high sodium foods to avoid. Per Dr Kirke Corin Change spironolactone to 25 mg daily bumex increased to 1 mg daily x 2 days then return to 0.5 mg daily bmet Tuesday/ pt has ov.  Pt to call our weekend line for further advice if swelling/ sob worsen or go to ED,   Pt/daughter, agreed to plan.

## 2013-04-14 ENCOUNTER — Ambulatory Visit (INDEPENDENT_AMBULATORY_CARE_PROVIDER_SITE_OTHER): Payer: Medicare Other | Admitting: Cardiovascular Disease

## 2013-04-14 ENCOUNTER — Encounter: Payer: Self-pay | Admitting: Cardiovascular Disease

## 2013-04-14 VITALS — BP 118/78 | HR 62 | Ht 61.0 in | Wt 159.2 lb

## 2013-04-14 DIAGNOSIS — I10 Essential (primary) hypertension: Secondary | ICD-10-CM

## 2013-04-14 DIAGNOSIS — I509 Heart failure, unspecified: Secondary | ICD-10-CM

## 2013-04-14 DIAGNOSIS — R609 Edema, unspecified: Secondary | ICD-10-CM

## 2013-04-14 DIAGNOSIS — I5032 Chronic diastolic (congestive) heart failure: Secondary | ICD-10-CM

## 2013-04-14 DIAGNOSIS — I6529 Occlusion and stenosis of unspecified carotid artery: Secondary | ICD-10-CM

## 2013-04-14 NOTE — Assessment & Plan Note (Signed)
She has followup with Dr. dew the next week for left carotid disease

## 2013-04-14 NOTE — Assessment & Plan Note (Signed)
Recent hospitalization for diastolic CHF. Weight is stable, she is taking Bumex 2 mg daily. Weight is stable at 155 pounds. We'll continue on spironolactone 25 mg daily, Bumex 2 milligrams daily

## 2013-04-14 NOTE — Progress Notes (Signed)
Patient ID: Catherine Holmes, female    DOB: [birth year], 79 y.o.   MRN: [id number]  HPI Comments: 79 year old woman with a history of DM,  peripheral vascular disease, 60 to 70% carotid arterial disease, moderate arterial disease of the lower extremities, status post bilateral iliac stenting, moderate renal stenosis, hyperlipidemia, history of severe  hypertension with labile blood pressures, history of episodic shortness of breath and chest squeezing relieved with albuterol/nebs who presents for routine followup. She has a sulfa allergy to loop diuretics. History of falls.    in the hospital at [facility] in 2012 for general malaise, severe hypertension, shortness of breath, chest pain. She had a stress test, lexiscan that showed no significant ischemia. Significant shortness of breath with lexiscan.   hospital admission on 01/08/2012 for shortness of breath and syncope. She was found to have significant anemia. Treated with Lasix for diastolic CHF.  Underlying severe left carotid arterial disease estimated at 70%.   Prior followup with Dr. Welton Flakes in pulmonary with numerous CT scans, MRIs, PFTs, sleep study. She was told that she had hypoxemia overnight and was started on nighttime oxygen.   She is not able to tolerate statins. She does handle zetia without significant side effects. previously encouraged to take red yeast rice.   History of iron infusion, side effects of  hypertension, malaise, headaches.   Recent episode where she was bending down to get treats to her dogs. She felt dizzy and fell onto her right face and right arm. Fractures to her arms. Hospitalization for workup showing very low potassium of 3.2, sodium in the high 120 range. Etiology of her fall was thought secondary to low blood pressure, unable to exclude other etiology. HCTZ was held for low potassium and low sodium.  Discharged to [facility] for rehabilitation.   Hx of  rash on her body since starting Bumex. Previously had sulfa  allergy on Lasix.  Recent hospitalization last month for malaise, shortness of breath. She had gentle diuresis. Discharged and instructed to stay on Bumex 2 mg daily. He presents today and feels well. She has heart failure nurse monitoring her symptoms. Weight has been stable at 155 pounds. Blood pressure at home has ranged from 105-140 systolic. Overall she is doing very well. She has followup with Dr. Wyn Quaker for left carotid arterial stenosis estimated at 60-79% disease. She has numbness and tingling on the left side of her face, pounding in her left ear.  EKG shows normal sinus rhythm rate,  no significant ST or T wave changes  Outpatient Encounter Prescriptions as of 04/14/2013  Medication Sig Dispense Refill  . albuterol (PROAIR HFA) 108 (90 BASE) MCG/ACT inhaler Inhale 2 puffs into the lungs every 6 (six) hours as needed.  3 Inhaler  0  . albuterol (PROVENTIL) (2.5 MG/3ML) 0.083% nebulizer solution Take 2.5 mg by nebulization every 6 (six) hours as needed.        Marland Kitchen arformoterol (BROVANA) 15 MCG/2ML NEBU Take 2 mLs (15 mcg total) by nebulization 2 (two) times daily.  120 mL  4  . aspirin 81 MG tablet Take 81 mg by mouth daily.        . beta carotene w/minerals (OCUVITE) tablet Take 1 tablet by mouth 2 (two) times daily.      . bumetanide (BUMEX) 2 MG tablet Take 1 mg by mouth daily.      . Cholecalciferol (VITAMIN D-3 PO) Take 1,000 Units by mouth.      . citalopram (CELEXA) 20 MG tablet  Take 20 mg by mouth daily.      Marland Kitchen dexlansoprazole (DEXILANT) 60 MG capsule Take 1 capsule (60 mg total) by mouth daily.  90 capsule  0  . doxazosin (CARDURA) 4 MG tablet TAKE 1 TABLET (4 MG TOTAL) BY MOUTH AT BEDTIME.  90 tablet  3  . ezetimibe (ZETIA) 10 MG tablet Take 1 tablet (10 mg total) by mouth daily.  90 tablet  3  . gabapentin (NEURONTIN) 100 MG capsule Take 100 mg by mouth 2 (two) times daily.      . hydrALAZINE (APRESOLINE) 25 MG tablet Take 25 mg by mouth 4 (four) times daily.      Marland Kitchen  HYDROcodone-acetaminophen (NORCO/VICODIN) 5-325 MG per tablet Take 1 tablet by mouth as needed.       . Magnesium 200 MG TABS Take 200 mg by mouth daily.      . metFORMIN (GLUCOPHAGE) 500 MG tablet Take 500 mg by mouth 2 (two) times daily with a meal.       . mometasone-formoterol (DULERA) 100-5 MCG/ACT AERO Inhale 2 puffs into the lungs.      . Multiple Vitamin (MULTIVITAMIN) tablet Take 1 tablet by mouth daily.        . nebivolol (BYSTOLIC) 5 MG tablet Take 5 mg by mouth daily.      . nitroGLYCERIN (NITROSTAT) 0.4 MG SL tablet Place 1 tablet (0.4 mg total) under the tongue every 5 (five) minutes as needed for chest pain.  25 tablet  3  . olmesartan (BENICAR) 40 MG tablet Take 40 mg by mouth daily.      . potassium chloride (K-DUR,KLOR-CON) 10 MEQ tablet Take 5 mEq by mouth 2 (two) times daily.      Marland Kitchen spironolactone (ALDACTONE) 50 MG tablet Take 1 tablet daily as needed  90 tablet  3  . tiotropium (SPIRIVA) 18 MCG inhalation capsule Place 1 capsule (18 mcg total) into inhaler and inhale daily.  90 capsule  0  . traMADol (ULTRAM) 50 MG tablet Take 100 mg by mouth every 4 (four) hours as needed.        No facility-administered encounter medications on file as of 04/14/2013.     Review of Systems  Constitutional: Negative.   HENT: Negative.   Eyes: Negative.   Respiratory: Negative.   Gastrointestinal: Negative.   Musculoskeletal: Positive for myalgias, joint swelling, arthralgias and gait problem.  Skin: Negative.   Neurological: Negative.   Psychiatric/Behavioral: Negative.   All other systems reviewed and are negative.    BP 118/78  Pulse 62  Ht 5\' 1"  (1.549 m)  Wt 159 lb 4 oz (72.235 kg)  BMI 30.11 kg/m2  Physical Exam  Nursing note and vitals reviewed. Constitutional: She is oriented to person, place, and time. She appears well-developed and well-nourished.  HENT:  Head: Normocephalic.  Nose: Nose normal.  Mouth/Throat: Oropharynx is clear and moist.  Eyes: Conjunctivae  are normal. Pupils are equal, round, and reactive to light.  Neck: Normal range of motion. Neck supple. No JVD present. Carotid bruit is present.  Cardiovascular: Normal rate, regular rhythm, S1 normal, S2 normal and intact distal pulses.  Exam reveals no gallop and no friction rub.   Murmur heard.  Crescendo systolic murmur is present with a grade of 2/6  Pulmonary/Chest: Effort normal. No respiratory distress. She has decreased breath sounds. She has no wheezes. She has no rales. She exhibits no tenderness.  Abdominal: Soft. Bowel sounds are normal. She exhibits no distension. There is no  tenderness.  Musculoskeletal: Normal range of motion. She exhibits no edema and no tenderness.  Lymphadenopathy:    She has no cervical adenopathy.  Neurological: She is alert and oriented to person, place, and time. Coordination normal.  Skin: Skin is warm and dry. No rash noted. No erythema.  Psychiatric: She has a normal mood and affect. Her behavior is normal. Judgment and thought content normal.    Assessment and Plan

## 2013-04-14 NOTE — Patient Instructions (Addendum)
You are doing well. No medication changes were made.  Continue the whole bumex and 1/2 pill spironolactone Goal weight 155 pounds  Please call us if you have new issues that need to be addressed before your next appt.  Your physician wants you to follow-up in: 6 months.  You will receive a reminder letter in the mail two months in advance. If you don't receive a letter, please call our office to schedule the follow-up appointment.

## 2013-04-14 NOTE — Assessment & Plan Note (Signed)
Blood pressure is well controlled on today's visit. No changes made to the medications. 

## 2013-04-15 LAB — BASIC METABOLIC PANEL
BUN/Creatinine Ratio: 16 (ref 11–26)
CO2: 30 mmol/L — ABNORMAL HIGH (ref 18–29)
Chloride: 88 mmol/L — ABNORMAL LOW (ref 97–108)
Potassium: 4.6 mmol/L (ref 3.5–5.2)
Sodium: 132 mmol/L — ABNORMAL LOW (ref 134–144)

## 2013-04-20 ENCOUNTER — Telehealth: Payer: Self-pay | Admitting: *Deleted

## 2013-04-20 NOTE — Telephone Encounter (Signed)
klorcon can be taken when she take a diuretic Magnesium can be used for constipation or periodically with diuretic

## 2013-04-20 NOTE — Telephone Encounter (Signed)
Please see below.

## 2013-04-20 NOTE — Telephone Encounter (Signed)
Pharmacy will be sending in a request..her daughter Eunice Blase called and wants to make sure Dr. Mariah Milling approves. [facility] hospitalist ordered these meds. Klorcon 10mg  and Magnesium oxide 200mg 

## 2013-04-21 ENCOUNTER — Other Ambulatory Visit: Payer: Self-pay

## 2013-04-21 MED ORDER — MAGNESIUM 200 MG PO TABS: 200.0000 mg | ORAL_TABLET | Freq: Every day | ORAL | Status: DC

## 2013-04-21 MED ORDER — POTASSIUM CHLORIDE CRYS ER 10 MEQ PO TBCR: 5.0000 meq | EXTENDED_RELEASE_TABLET | Freq: Two times a day (BID) | ORAL | Status: DC

## 2013-04-21 NOTE — Telephone Encounter (Signed)
lmtcb

## 2013-04-21 NOTE — Telephone Encounter (Signed)
dtr informed Understanding verb 

## 2013-04-22 DIAGNOSIS — I509 Heart failure, unspecified: Secondary | ICD-10-CM

## 2013-04-22 DIAGNOSIS — I5032 Chronic diastolic (congestive) heart failure: Secondary | ICD-10-CM

## 2013-04-22 DIAGNOSIS — R195 Other fecal abnormalities: Secondary | ICD-10-CM

## 2013-04-22 DIAGNOSIS — R0602 Shortness of breath: Secondary | ICD-10-CM

## 2013-04-22 DIAGNOSIS — I6529 Occlusion and stenosis of unspecified carotid artery: Secondary | ICD-10-CM

## 2013-04-29 ENCOUNTER — Telehealth: Payer: Self-pay

## 2013-04-29 NOTE — Telephone Encounter (Signed)
lmtcb

## 2013-04-29 NOTE — Telephone Encounter (Signed)
Pt daughter has question about Bumex dosage, also needs refill

## 2013-05-01 ENCOUNTER — Telehealth: Payer: Self-pay

## 2013-05-01 MED ORDER — BUMETANIDE 1 MG PO TABS: 1.0000 mg | ORAL_TABLET | Freq: Every day | ORAL | Status: DC

## 2013-05-01 NOTE — Telephone Encounter (Signed)
Patient's daughter states Catherine Holmes is only taking the Bumex @ 1 mg one tablet daily not 2 mg once daily. Please advise if this should be different.

## 2013-05-01 NOTE — Telephone Encounter (Signed)
[facility] [place] [facility] Bumex 1 mg take one tablet daily.

## 2013-05-01 NOTE — Telephone Encounter (Signed)
Patient's daughter Eunice Blase called back and would like for Jasmine December to call her regarding this medication refill.

## 2013-05-01 NOTE — Telephone Encounter (Signed)
Need to have a refill sent for Bumex 1 mg take one tablet daily [facility] [place].

## 2013-05-01 NOTE — Telephone Encounter (Signed)
Notified patient's daughter Catherine Holmes the dose of Bumex that should be called in is Bumex 1 mg take one tablet daily.

## 2013-05-03 NOTE — Telephone Encounter (Signed)
Need to be watching weight. Suspect she may need 2 mg dose. She has had admissions in the past for chf.

## 2013-05-04 ENCOUNTER — Other Ambulatory Visit: Payer: Self-pay

## 2013-05-04 NOTE — Telephone Encounter (Signed)
LMOM to have Eunice Blase (daughter) call regarding medication.

## 2013-05-07 NOTE — Telephone Encounter (Signed)
LMTCB

## 2013-05-07 NOTE — Telephone Encounter (Signed)
Pt's daughter made aware of Dr Windell Hummingbird recommendations.   Pt's weight has trended up 4 lbs, but pt's daughter feels it is related to eating habits more so than fluid. Denies any LE edema. Pt seeing new primary MD today will have him evaluate pt and make aware of Dr Windell Hummingbird recommendations.   Pt's daughter WCB to advise of any medication changes.

## 2013-05-12 ENCOUNTER — Encounter: Payer: Self-pay | Admitting: *Deleted

## 2013-05-26 ENCOUNTER — Inpatient Hospital Stay: Payer: Self-pay | Admitting: Family Medicine

## 2013-05-26 LAB — TROPONIN I: Troponin-I: <0.02 ng/mL

## 2013-05-26 LAB — CBC
HCT: 25.6 % — ABNORMAL LOW (ref 35.0–47.0)
HGB: 8.1 g/dL — ABNORMAL LOW (ref 12.0–16.0)
MCH: 23.1 pg — ABNORMAL LOW (ref 26.0–34.0)
MCV: 73 fL — ABNORMAL LOW (ref 80–100)
Platelet: 333 10*3/uL (ref 150–440)
RDW: 20.7 % — ABNORMAL HIGH (ref 11.5–14.5)
WBC: 6.3 10*3/uL (ref 3.6–11.0)

## 2013-05-26 LAB — CK TOTAL AND CKMB (NOT AT ARMC)
CK, Total: 55 U/L (ref 21–215)
CK-MB: 2.2 ng/mL (ref 0.5–3.6)
CK-MB: 1.7 ng/mL (ref 0.5–3.6)

## 2013-05-26 LAB — COMPREHENSIVE METABOLIC PANEL
Alkaline Phosphatase: 101 U/L (ref 50–136)
BUN: 9 mg/dL (ref 7–18)
Bilirubin,Total: 0.2 mg/dL (ref 0.2–1.0)
Creatinine: 0.7 mg/dL (ref 0.60–1.30)
EGFR (Non-African Amer.): >60
Glucose: 118 mg/dL — ABNORMAL HIGH (ref 65–99)
Osmolality: 262 (ref 275–301)
SGOT(AST): 32 U/L (ref 15–37)
SGPT (ALT): 35 U/L (ref 12–78)
Sodium: 131 mmol/L — ABNORMAL LOW (ref 136–145)

## 2013-05-27 DIAGNOSIS — D649 Anemia, unspecified: Secondary | ICD-10-CM

## 2013-05-27 DIAGNOSIS — J9620 Acute and chronic respiratory failure, unspecified whether with hypoxia or hypercapnia: Secondary | ICD-10-CM

## 2013-05-27 DIAGNOSIS — I369 Nonrheumatic tricuspid valve disorder, unspecified: Secondary | ICD-10-CM

## 2013-05-27 DIAGNOSIS — I10 Essential (primary) hypertension: Secondary | ICD-10-CM

## 2013-05-27 DIAGNOSIS — I5033 Acute on chronic diastolic (congestive) heart failure: Secondary | ICD-10-CM

## 2013-05-27 LAB — BASIC METABOLIC PANEL
Anion Gap: 2 — ABNORMAL LOW (ref 7–16)
Creatinine: 0.66 mg/dL (ref 0.60–1.30)
EGFR (African American): >60
Osmolality: 266 (ref 275–301)
Potassium: 3.9 mmol/L (ref 3.5–5.1)
Sodium: 133 mmol/L — ABNORMAL LOW (ref 136–145)

## 2013-05-27 LAB — CBC WITH DIFFERENTIAL/PLATELET
Basophil #: 0 10*3/uL (ref 0.0–0.1)
Eosinophil %: 1.7 %
HCT: 25.5 % — ABNORMAL LOW (ref 35.0–47.0)
HGB: 8 g/dL — ABNORMAL LOW (ref 12.0–16.0)
Lymphocyte %: 22.2 %
MCH: 23 pg — ABNORMAL LOW (ref 26.0–34.0)
MCHC: 31.5 g/dL — ABNORMAL LOW (ref 32.0–36.0)
Monocyte #: 0.8 x10 3/mm (ref 0.2–0.9)
Neutrophil #: 4.9 10*3/uL (ref 1.4–6.5)
RBC: 3.5 10*6/uL — ABNORMAL LOW (ref 3.80–5.20)
RDW: 20.7 % — ABNORMAL HIGH (ref 11.5–14.5)

## 2013-05-27 LAB — CLOSTRIDIUM DIFFICILE BY PCR

## 2013-05-27 LAB — CK TOTAL AND CKMB (NOT AT ARMC)
CK, Total: 48 U/L (ref 21–215)
CK-MB: 1 ng/mL (ref 0.5–3.6)

## 2013-05-27 LAB — TROPONIN I: Troponin-I: <0.02 ng/mL

## 2013-05-28 LAB — BASIC METABOLIC PANEL
Chloride: 90 mmol/L — ABNORMAL LOW (ref 98–107)
Co2: 40 mmol/L (ref 21–32)
Creatinine: 0.74 mg/dL (ref 0.60–1.30)
EGFR (African American): >60
EGFR (Non-African Amer.): >60
Glucose: 100 mg/dL — ABNORMAL HIGH (ref 65–99)

## 2013-05-28 LAB — CBC WITH DIFFERENTIAL/PLATELET
Basophil #: 0 10*3/uL (ref 0.0–0.1)
Basophil %: 0.6 %
Eosinophil %: 1.6 %
HGB: 7.7 g/dL — ABNORMAL LOW (ref 12.0–16.0)
Lymphocyte #: 1.4 10*3/uL (ref 1.0–3.6)
MCH: 22.4 pg — ABNORMAL LOW (ref 26.0–34.0)
Monocyte #: 0.8 x10 3/mm (ref 0.2–0.9)
Monocyte %: 11.5 %
Neutrophil %: 65.8 %
RBC: 3.42 10*6/uL — ABNORMAL LOW (ref 3.80–5.20)

## 2013-05-29 ENCOUNTER — Telehealth: Payer: Self-pay | Admitting: *Deleted

## 2013-05-29 LAB — CBC WITH DIFFERENTIAL/PLATELET
Basophil %: 0.6 %
Eosinophil #: 0.1 10*3/uL (ref 0.0–0.7)
Eosinophil %: 1.8 %
HCT: 25.5 % — ABNORMAL LOW (ref 35.0–47.0)
HGB: 8.2 g/dL — ABNORMAL LOW (ref 12.0–16.0)
Lymphocyte #: 1.4 10*3/uL (ref 1.0–3.6)
Lymphocyte %: 20.3 %
MCH: 22.8 pg — ABNORMAL LOW (ref 26.0–34.0)
MCV: 71 fL — ABNORMAL LOW (ref 80–100)
Neutrophil %: 65.6 %
Platelet: 347 10*3/uL (ref 150–440)
RBC: 3.62 10*6/uL — ABNORMAL LOW (ref 3.80–5.20)
RDW: 20.1 % — ABNORMAL HIGH (ref 11.5–14.5)

## 2013-05-29 LAB — BASIC METABOLIC PANEL
Anion Gap: 6 — ABNORMAL LOW (ref 7–16)
Calcium, Total: 9 mg/dL (ref 8.5–10.1)
Chloride: 87 mmol/L — ABNORMAL LOW (ref 98–107)
Co2: 39 mmol/L — ABNORMAL HIGH (ref 21–32)
EGFR (African American): >60
Potassium: 3.4 mmol/L — ABNORMAL LOW (ref 3.5–5.1)
Sodium: 132 mmol/L — ABNORMAL LOW (ref 136–145)

## 2013-05-29 NOTE — Telephone Encounter (Signed)
Patient contacted regarding discharge from [facility] on 05/29/13.  Patient understands to follow up with provider Alinda Money PA on 06/10/13 at 2:45pm at [facility] office. Patient understands discharge instructions? yes Patient understands medications and regiment? yes Patient understands to bring all medications to this visit? yes  time of appointment changed to 3pm at daughters request Mylo Red RN

## 2013-06-02 ENCOUNTER — Encounter: Payer: Self-pay | Admitting: *Deleted

## 2013-06-03 ENCOUNTER — Telehealth: Payer: Self-pay

## 2013-06-03 DIAGNOSIS — D649 Anemia, unspecified: Secondary | ICD-10-CM

## 2013-06-03 NOTE — Telephone Encounter (Signed)
Pt's daughter called inquiring about referral to hematology.  Pt in [facility] 8/5-8/8 for acute resp failure and anemia.  Discharge summary from [facility] states she will need referral to hematology as outpatient.  Daughter has a list of hematologists she looked up on internet, but wants to know what Dr. Mariah Milling suggests.

## 2013-06-03 NOTE — Telephone Encounter (Signed)
Pt daughter called requesting a referral to hematologist. PLease advise

## 2013-06-03 NOTE — Telephone Encounter (Signed)
Would refer her to Dr. Neale Burly or first available hematologist at [facility] She may need iron infusion, possible transfusion  Would confirm she is taking Bumex 2 mg twice a day

## 2013-06-03 NOTE — Telephone Encounter (Signed)
Barrie Folk, RN at 06/02/2013 3:51 PM    Status: Signed             Patient contacted regarding discharge from [facility] on 05/29/13.  Patient understands to follow up with provider Alinda Money PA on 06/10/13 at 2:45pm at [facility] office.  Patient understands discharge instructions? yes  Patient understands medications and regiment? yes  Patient understands to bring all medications to this visit? yes  time of appointment changed to 3pm at daughters request  Mylo Red RN

## 2013-06-04 NOTE — Telephone Encounter (Signed)
Discussed with pt's daughter pt is taking Spironolactone 50mg  QD and Bumex 2mg  BID at present.  Daughter wants to make sure with Dr Mariah Milling she should be taking this much diuretic medication.  Please advise.  Thanks

## 2013-06-04 NOTE — Telephone Encounter (Signed)
Advised pt's daughter of Dr Windell Hummingbird recommendation of Dr Neale Burly or first available [facility] hematologist.  Pt's daughter is going to call to schedule appt.  Will call back if needs our office to refer pt.  Pt is taking

## 2013-06-05 NOTE — Telephone Encounter (Signed)
Scheduled for 8/21 at 2:30pm with hematology at [facility]. Their office will call to confirm appt with patient's daughter.

## 2013-06-05 NOTE — Addendum Note (Signed)
Addended by: Freddi Starr on: 06/05/2013 09:45 AM   Modules accepted: Orders

## 2013-06-05 NOTE — Telephone Encounter (Signed)
Spoke with pt dtr, dr gitten's office needs for [place] to make the referral to them. Also dtr made aware dr Mariah Milling has not answered the questions regarding her meds. Will discuss with him today and call her back. Will make referral for pt.

## 2013-06-05 NOTE — Telephone Encounter (Signed)
Would take bumex 2 mg BID with spironolactone daily Will check labs periodically

## 2013-06-05 NOTE — Telephone Encounter (Signed)
Tried to call pts dtr at work but she had already left for today. Pt has a follow up next week.

## 2013-06-08 NOTE — Telephone Encounter (Signed)
Spoke with patient's daughter. She is not sure of her meds. States I should call her Mom at home. Spoke with patient on her cell phone [phone]. Complaints of some SOB. Only taking Bumex 1mg  2 tabs in the am with Aldactone Advised her to take the Bumex 1mg  2 tabs in the pm also per Dr.Gollan. Reminded of post hospital appointment on Wednesday

## 2013-06-08 NOTE — Addendum Note (Signed)
Addended by: Gerome Apley on: 06/08/2013 10:32 AM   Modules accepted: Orders

## 2013-06-09 ENCOUNTER — Inpatient Hospital Stay: Payer: Self-pay | Admitting: Family Medicine

## 2013-06-09 ENCOUNTER — Telehealth: Payer: Self-pay | Admitting: *Deleted

## 2013-06-09 LAB — CBC
HCT: 25.5 % — ABNORMAL LOW (ref 35.0–47.0)
MCH: 22.8 pg — ABNORMAL LOW (ref 26.0–34.0)
Platelet: 336 10*3/uL (ref 150–440)
RBC: 3.58 10*6/uL — ABNORMAL LOW (ref 3.80–5.20)
WBC: 20.5 10*3/uL — ABNORMAL HIGH (ref 3.6–11.0)

## 2013-06-09 LAB — COMPREHENSIVE METABOLIC PANEL
Alkaline Phosphatase: 102 U/L (ref 50–136)
Bilirubin,Total: 0.4 mg/dL (ref 0.2–1.0)
Calcium, Total: 8.8 mg/dL (ref 8.5–10.1)
Chloride: 86 mmol/L — ABNORMAL LOW (ref 98–107)
Co2: 34 mmol/L — ABNORMAL HIGH (ref 21–32)
EGFR (African American): 53 — ABNORMAL LOW
EGFR (Non-African Amer.): 46 — ABNORMAL LOW
Glucose: 133 mg/dL — ABNORMAL HIGH (ref 65–99)
Potassium: 4.3 mmol/L (ref 3.5–5.1)
SGOT(AST): 19 U/L (ref 15–37)
Sodium: 125 mmol/L — ABNORMAL LOW (ref 136–145)
Total Protein: 6.6 g/dL (ref 6.4–8.2)

## 2013-06-09 LAB — TROPONIN I: Troponin-I: <0.02 ng/mL

## 2013-06-09 LAB — PROTIME-INR
INR: 1.2
Prothrombin Time: 15 secs — ABNORMAL HIGH (ref 11.5–14.7)

## 2013-06-09 NOTE — Telephone Encounter (Signed)
Daughter called and cancelled Catherine Holmes's appt on Wed. 06/10/13 due to going to ED via ambulamce due to sob

## 2013-06-09 NOTE — Telephone Encounter (Signed)
Pt in [facility] ED at present will forward FYI to Dr Mariah Milling.

## 2013-06-10 ENCOUNTER — Encounter: Payer: Medicare Other | Admitting: Physician Assistant

## 2013-06-10 LAB — CBC WITH DIFFERENTIAL/PLATELET
Eosinophil #: 0 10*3/uL (ref 0.0–0.7)
Eosinophil %: 0 %
HCT: 25.1 % — ABNORMAL LOW (ref 35.0–47.0)
HGB: 7.9 g/dL — ABNORMAL LOW (ref 12.0–16.0)
Lymphocyte #: 1 10*3/uL (ref 1.0–3.6)
Lymphocyte %: 6.7 %
MCH: 22.6 pg — ABNORMAL LOW (ref 26.0–34.0)
MCV: 72 fL — ABNORMAL LOW (ref 80–100)
Monocyte #: 1.1 x10 3/mm — ABNORMAL HIGH (ref 0.2–0.9)
Monocyte %: 7.3 %
Neutrophil %: 85.9 %
RDW: 21.2 % — ABNORMAL HIGH (ref 11.5–14.5)

## 2013-06-10 LAB — BASIC METABOLIC PANEL
Anion Gap: 1 — ABNORMAL LOW (ref 7–16)
BUN: 18 mg/dL (ref 7–18)
Calcium, Total: 8.9 mg/dL (ref 8.5–10.1)
Chloride: 88 mmol/L — ABNORMAL LOW (ref 98–107)
Co2: 37 mmol/L — ABNORMAL HIGH (ref 21–32)
EGFR (African American): >60
Glucose: 113 mg/dL — ABNORMAL HIGH (ref 65–99)
Potassium: 4 mmol/L (ref 3.5–5.1)

## 2013-06-11 LAB — CBC WITH DIFFERENTIAL/PLATELET
Basophil #: 0 10*3/uL (ref 0.0–0.1)
Basophil %: 0.1 %
Lymphocyte #: 0.9 10*3/uL — ABNORMAL LOW (ref 1.0–3.6)
Lymphocyte %: 6.6 %
MCHC: 31.7 g/dL — ABNORMAL LOW (ref 32.0–36.0)
Monocyte #: 1.2 x10 3/mm — ABNORMAL HIGH (ref 0.2–0.9)
Neutrophil %: 84 %
RBC: 3.57 10*6/uL — ABNORMAL LOW (ref 3.80–5.20)
RDW: 21.6 % — ABNORMAL HIGH (ref 11.5–14.5)

## 2013-06-11 LAB — BASIC METABOLIC PANEL
Anion Gap: 8 (ref 7–16)
Chloride: 87 mmol/L — ABNORMAL LOW (ref 98–107)
Co2: 33 mmol/L — ABNORMAL HIGH (ref 21–32)
Creatinine: 0.64 mg/dL (ref 0.60–1.30)
EGFR (African American): >60
EGFR (Non-African Amer.): >60
Osmolality: 260 (ref 275–301)
Potassium: 4 mmol/L (ref 3.5–5.1)
Sodium: 128 mmol/L — ABNORMAL LOW (ref 136–145)

## 2013-06-12 LAB — CBC WITH DIFFERENTIAL/PLATELET
Eosinophil #: 0 10*3/uL (ref 0.0–0.7)
HCT: 27.9 % — ABNORMAL LOW (ref 35.0–47.0)
Lymphocyte #: 1.3 10*3/uL (ref 1.0–3.6)
Lymphocyte %: 12.1 %
MCHC: 31.6 g/dL — ABNORMAL LOW (ref 32.0–36.0)
Monocyte %: 11.7 %
Neutrophil %: 75.2 %
RBC: 3.85 10*6/uL (ref 3.80–5.20)
RDW: 21.7 % — ABNORMAL HIGH (ref 11.5–14.5)

## 2013-06-12 LAB — BASIC METABOLIC PANEL
Anion Gap: 7 (ref 7–16)
BUN: 11 mg/dL (ref 7–18)
EGFR (Non-African Amer.): >60
Glucose: 140 mg/dL — ABNORMAL HIGH (ref 65–99)
Osmolality: 257 (ref 275–301)

## 2013-06-13 LAB — BASIC METABOLIC PANEL
BUN: 11 mg/dL (ref 7–18)
Calcium, Total: 9 mg/dL (ref 8.5–10.1)
Chloride: 89 mmol/L — ABNORMAL LOW (ref 98–107)
EGFR (Non-African Amer.): >60
Glucose: 135 mg/dL — ABNORMAL HIGH (ref 65–99)
Potassium: 4.1 mmol/L (ref 3.5–5.1)
Sodium: 127 mmol/L — ABNORMAL LOW (ref 136–145)

## 2013-06-13 LAB — CBC WITH DIFFERENTIAL/PLATELET
Basophil #: 0.1 10*3/uL (ref 0.0–0.1)
Basophil %: 0.7 %
Eosinophil #: 0.3 10*3/uL (ref 0.0–0.7)
Eosinophil %: 3.1 %
HCT: 25.2 % — ABNORMAL LOW (ref 35.0–47.0)
HGB: 8.2 g/dL — ABNORMAL LOW (ref 12.0–16.0)
Lymphocyte #: 1.4 10*3/uL (ref 1.0–3.6)
Lymphocyte %: 15.8 %
Monocyte %: 10.3 %
Neutrophil #: 6.1 10*3/uL (ref 1.4–6.5)
Neutrophil %: 70.1 %
RDW: 21.6 % — ABNORMAL HIGH (ref 11.5–14.5)

## 2013-06-13 LAB — VANCOMYCIN, TROUGH: Vancomycin, Trough: 5 ug/mL — ABNORMAL LOW (ref 10–20)

## 2013-06-13 LAB — EXPECTORATED SPUTUM ASSESSMENT W GRAM STAIN, RFLX TO RESP C

## 2013-06-14 LAB — CBC WITH DIFFERENTIAL/PLATELET
Basophil %: 0.8 %
Eosinophil #: 0.2 10*3/uL (ref 0.0–0.7)
Eosinophil %: 1.9 %
HCT: 26.6 % — ABNORMAL LOW (ref 35.0–47.0)
HGB: 8.7 g/dL — ABNORMAL LOW (ref 12.0–16.0)
Lymphocyte #: 1.5 10*3/uL (ref 1.0–3.6)
Lymphocyte %: 15.1 %
MCH: 23.1 pg — ABNORMAL LOW (ref 26.0–34.0)
MCHC: 32.6 g/dL (ref 32.0–36.0)
MCV: 71 fL — ABNORMAL LOW (ref 80–100)
Monocyte #: 0.8 x10 3/mm (ref 0.2–0.9)
Neutrophil %: 73.6 %
Platelet: 443 10*3/uL — ABNORMAL HIGH (ref 150–440)
RBC: 3.75 10*6/uL — ABNORMAL LOW (ref 3.80–5.20)
RDW: 21.9 % — ABNORMAL HIGH (ref 11.5–14.5)

## 2013-06-14 LAB — BASIC METABOLIC PANEL
Anion Gap: 4 — ABNORMAL LOW (ref 7–16)
Calcium, Total: 8.9 mg/dL (ref 8.5–10.1)
Co2: 34 mmol/L — ABNORMAL HIGH (ref 21–32)
Creatinine: 0.58 mg/dL — ABNORMAL LOW (ref 0.60–1.30)
EGFR (African American): >60
EGFR (Non-African Amer.): >60
Glucose: 134 mg/dL — ABNORMAL HIGH (ref 65–99)
Osmolality: 260 (ref 275–301)
Potassium: 3.9 mmol/L (ref 3.5–5.1)
Sodium: 128 mmol/L — ABNORMAL LOW (ref 136–145)

## 2013-06-14 LAB — CULTURE, BLOOD (SINGLE)

## 2013-06-15 LAB — BASIC METABOLIC PANEL
Chloride: 89 mmol/L — ABNORMAL LOW (ref 98–107)
Creatinine: 0.6 mg/dL (ref 0.60–1.30)
EGFR (African American): >60
EGFR (Non-African Amer.): >60
Osmolality: 259 (ref 275–301)

## 2013-06-18 ENCOUNTER — Ambulatory Visit: Payer: Self-pay | Admitting: Hematology and Oncology

## 2013-06-19 LAB — CBC CANCER CENTER
Basophil #: 0.1 x10 3/mm (ref 0.0–0.1)
Basophil %: 0.7 %
Eosinophil #: 0.1 x10 3/mm (ref 0.0–0.7)
Eosinophil %: 0.7 %
HGB: 9.1 g/dL — ABNORMAL LOW (ref 12.0–16.0)
Monocyte #: 0.8 x10 3/mm (ref 0.2–0.9)
Monocyte %: 7.9 %
Neutrophil #: 7.7 x10 3/mm — ABNORMAL HIGH (ref 1.4–6.5)
Neutrophil %: 75.2 %
RBC: 4.14 10*6/uL (ref 3.80–5.20)
RDW: 22.3 % — ABNORMAL HIGH (ref 11.5–14.5)

## 2013-06-19 LAB — IRON AND TIBC
Iron Bind.Cap.(Total): 427 ug/dL (ref 250–450)
Iron Saturation: 8 %
Iron: 36 ug/dL — ABNORMAL LOW (ref 50–170)

## 2013-06-22 ENCOUNTER — Ambulatory Visit: Payer: Self-pay | Admitting: Hematology and Oncology

## 2013-06-24 ENCOUNTER — Encounter: Payer: Self-pay | Admitting: Cardiovascular Disease

## 2013-06-24 ENCOUNTER — Telehealth: Payer: Self-pay | Admitting: *Deleted

## 2013-06-24 ENCOUNTER — Ambulatory Visit (INDEPENDENT_AMBULATORY_CARE_PROVIDER_SITE_OTHER): Payer: Medicare Other | Admitting: Cardiovascular Disease

## 2013-06-24 VITALS — BP 90/64 | HR 105 | Ht 61.0 in | Wt 148.0 lb

## 2013-06-24 DIAGNOSIS — I509 Heart failure, unspecified: Secondary | ICD-10-CM

## 2013-06-24 DIAGNOSIS — I5032 Chronic diastolic (congestive) heart failure: Secondary | ICD-10-CM

## 2013-06-24 DIAGNOSIS — I10 Essential (primary) hypertension: Secondary | ICD-10-CM

## 2013-06-24 DIAGNOSIS — I739 Peripheral vascular disease, unspecified: Secondary | ICD-10-CM

## 2013-06-24 NOTE — Assessment & Plan Note (Signed)
Continue aggressive cholesterol management , goal LDL less than 70

## 2013-06-24 NOTE — Patient Instructions (Addendum)
You are dehydrated today  Please hold the bumex and spirolactone for two days Drink plenty of fluids for two days Restart bumex 1 mg once or twice a day as needed Up to 2 mg twice a day for weight gain  Goal weight 150 pounds  Please call us if you have new issues that need to be addressed before your next appt.  Your physician wants you to follow-up in: 1 month.

## 2013-06-24 NOTE — Assessment & Plan Note (Signed)
She appears mildly dehydrated today. Encouraged her to hold her Bumex for at least 2 days, wait for her blood pressure to improve before restarting Bumex one or 2 mg daily. Ideal weight at home would be 150 pounds, in our office 155 pounds or less. Weight today 148 pounds. Suspect moderate dehydrated with low blood pressure.

## 2013-06-24 NOTE — Telephone Encounter (Signed)
Dr. Mariah Milling called stating that pt had him paged regarding her heartrate and asked to work pt in to see him this morning.  Spoke w/ pt.  She will be here around 10:00.

## 2013-06-24 NOTE — Assessment & Plan Note (Signed)
Blood pressure is low today likely from mild to moderate dehydration. We'll cut back on her diuretic as above

## 2013-06-24 NOTE — Progress Notes (Signed)
Patient ID: Catherine Holmes, female    DOB: 26-Nov-1927, 79 y.o.   MRN: [id number]  HPI Comments: 79 year old woman with a history of DM,  peripheral vascular disease, 60 to 70% carotid arterial disease, moderate arterial disease of the lower extremities, status post bilateral iliac stenting, moderate renal stenosis, hyperlipidemia, history of severe  hypertension with labile blood pressures, history of episodic shortness of breath and chest squeezing relieved with albuterol/nebs who presents for routine followup. She has a sulfa allergy to loop diuretics. History of falls.   left carotid arterial stenosis estimated at 60-79% disease. Ideal weight 155 pounds   in the hospital at [facility] in 2012 for general malaise, severe hypertension, shortness of breath, chest pain. She had a stress test, lexiscan that showed no significant ischemia. Significant shortness of breath with lexiscan.   hospital admission on 01/08/2012 for shortness of breath and syncope. She was found to have significant anemia. Treated with Lasix for diastolic CHF.  Underlying severe left carotid arterial disease estimated at 70%.   Prior followup with Dr. Welton Flakes in pulmonary with numerous CT scans, MRIs, PFTs, sleep study. She was told that she had hypoxemia overnight and was started on nighttime oxygen.   She is not able to tolerate statins. She does handle zetia without significant side effects. previously encouraged to take red yeast rice.   History of iron infusion, side effects of  hypertension, malaise, headaches.    ---> She presents today for urgent add-on. She reports tachycardia, hypotension, malaise. Recent hospitalization for diastolic CHF and she was discharged on Bumex 2 mg twice a day with spironolactone. She denies any shortness of breath, no edema. Some dizziness. She's not been drinking much fluids at home. Otherwise she feels well. She had been doing well until just recently. Echocardiogram in the hospital August  2014 shows normal ejection fraction, severely elevated right ventricular systolic pressures, mild to moderate TR  EKG shows sinus tachycardia rate 105 eats per minute,  no significant ST or T wave changes  Outpatient Encounter Prescriptions as of 06/24/2013  Medication Sig Dispense Refill  . albuterol (PROAIR HFA) 108 (90 BASE) MCG/ACT inhaler Inhale 2 puffs into the lungs every 6 (six) hours as needed.  3 Inhaler  0  . albuterol (PROVENTIL) (2.5 MG/3ML) 0.083% nebulizer solution Take 2.5 mg by nebulization every 6 (six) hours as needed.        Marland Kitchen arformoterol (BROVANA) 15 MCG/2ML NEBU Take 2 mLs (15 mcg total) by nebulization 2 (two) times daily.  120 mL  4  . aspirin 81 MG tablet Take 81 mg by mouth daily.        . beta carotene w/minerals (OCUVITE) tablet Take 1 tablet by mouth 2 (two) times daily.      . bumetanide (BUMEX) 1 MG tablet Take 2 tabs BID      . Cholecalciferol (VITAMIN D-3 PO) Take 1,000 Units by mouth.      . citalopram (CELEXA) 20 MG tablet Take 20 mg by mouth daily.      Marland Kitchen dexlansoprazole (DEXILANT) 60 MG capsule Take 1 capsule (60 mg total) by mouth daily.  90 capsule  0  . diphenhydramine-acetaminophen (TYLENOL PM) 25-500 MG TABS Take 1 tablet by mouth at bedtime as needed.      . doxazosin (CARDURA) 4 MG tablet TAKE 1 TABLET (4 MG TOTAL) BY MOUTH AT BEDTIME.  90 tablet  3  . ezetimibe (ZETIA) 10 MG tablet Take 1 tablet (10 mg total) by mouth daily.  90 tablet  3  . ferrous sulfate 325 (65 FE) MG tablet Take 325 mg by mouth every other day.      . gabapentin (NEURONTIN) 100 MG capsule Take 100 mg by mouth 2 (two) times daily.      . hydrALAZINE (APRESOLINE) 25 MG tablet Take 12.5 mg by mouth as needed.       Marland Kitchen HYDROcodone-acetaminophen (NORCO/VICODIN) 5-325 MG per tablet Take 1 tablet by mouth as needed.       . metFORMIN (GLUCOPHAGE) 500 MG tablet Take 500 mg by mouth 2 (two) times daily with a meal.       . mometasone-formoterol (DULERA) 100-5 MCG/ACT AERO Inhale 2 puffs  into the lungs.      . Multiple Vitamin (MULTIVITAMIN) tablet Take 1 tablet by mouth daily.        . nebivolol (BYSTOLIC) 5 MG tablet Take 5 mg by mouth daily.      . nitroGLYCERIN (NITROSTAT) 0.4 MG SL tablet Place 1 tablet (0.4 mg total) under the tongue every 5 (five) minutes as needed for chest pain.  25 tablet  3  . olmesartan (BENICAR) 40 MG tablet Take 40 mg by mouth daily.      . potassium chloride (K-DUR,KLOR-CON) 10 MEQ tablet Take 0.5 tablets (5 mEq total) by mouth 2 (two) times daily.  60 tablet  3  . spironolactone (ALDACTONE) 50 MG tablet Take 1 tablet daily as needed  90 tablet  3  . tiotropium (SPIRIVA) 18 MCG inhalation capsule Place 1 capsule (18 mcg total) into inhaler and inhale daily.  90 capsule  0  . traMADol (ULTRAM) 50 MG tablet Take 100 mg by mouth every 4 (four) hours as needed.       . [DISCONTINUED] Magnesium 200 MG TABS Take 1 tablet (200 mg total) by mouth daily.  60 each  3   No facility-administered encounter medications on file as of 06/24/2013.     Review of Systems  Constitutional: Negative.   HENT: Negative.   Eyes: Negative.   Respiratory: Negative.   Gastrointestinal: Negative.   Musculoskeletal: Positive for myalgias, joint swelling, arthralgias and gait problem.  Skin: Negative.   Neurological: Negative.   Psychiatric/Behavioral: Negative.   All other systems reviewed and are negative.    BP 90/64  Pulse 105  Ht 5\' 1"  (1.549 m)  Wt 148 lb (67.132 kg)  BMI 27.98 kg/m2  Physical Exam  Nursing note and vitals reviewed. Constitutional: She is oriented to person, place, and time. She appears well-developed and well-nourished.  HENT:  Head: Normocephalic.  Nose: Nose normal.  Mouth/Throat: Oropharynx is clear and moist.  Eyes: Conjunctivae are normal. Pupils are equal, round, and reactive to light.  Neck: Normal range of motion. Neck supple. No JVD present. Carotid bruit is present.  Cardiovascular: Normal rate, regular rhythm, S1 normal,  S2 normal and intact distal pulses.  Exam reveals no gallop and no friction rub.   Murmur heard.  Crescendo systolic murmur is present with a grade of 2/6  Pulmonary/Chest: Effort normal. No respiratory distress. She has decreased breath sounds. She has no wheezes. She has no rales. She exhibits no tenderness.  Abdominal: Soft. Bowel sounds are normal. She exhibits no distension. There is no tenderness.  Musculoskeletal: Normal range of motion. She exhibits no edema and no tenderness.  Lymphadenopathy:    She has no cervical adenopathy.  Neurological: She is alert and oriented to person, place, and time. Coordination normal.  Skin: Skin is warm  and dry. No rash noted. No erythema.  Psychiatric: She has a normal mood and affect. Her behavior is normal. Judgment and thought content normal.    Assessment and Plan

## 2013-06-24 NOTE — Telephone Encounter (Signed)
Pt called because she said the morning about 8:00 am her heart rate was 112 beats/minute, her pulse usually ranges between 68 to 70 beats/minute, BP is 126/85 then pt took her medications. Now pt's  heart rate is 131 beats/ minute and her chest is feeling tight, no other symptoms. Pt states was in the hospital 2 weeks ago with CHF, pneumonia. Dr. Hebert Soho MD aware of pt's symptoms. And states he will call the office in [place] and will ask them to call  pt to come in  to be seen today. " I will take care of it"

## 2013-06-24 NOTE — Telephone Encounter (Signed)
Patients heart rate is high 108 to 112, please advise

## 2013-06-25 ENCOUNTER — Ambulatory Visit: Payer: Self-pay | Admitting: Hematology and Oncology

## 2013-07-08 ENCOUNTER — Other Ambulatory Visit: Payer: Self-pay | Admitting: *Deleted

## 2013-07-08 MED ORDER — DOXAZOSIN MESYLATE 4 MG PO TABS: ORAL_TABLET | ORAL | Status: DC

## 2013-07-08 NOTE — Telephone Encounter (Signed)
90 day supply

## 2013-07-09 ENCOUNTER — Emergency Department: Payer: Self-pay | Admitting: Emergency Medicine

## 2013-07-09 LAB — COMPREHENSIVE METABOLIC PANEL
Albumin: 3.4 g/dL (ref 3.4–5.0)
Alkaline Phosphatase: 86 U/L (ref 50–136)
Anion Gap: 7 (ref 7–16)
BUN: 9 mg/dL (ref 7–18)
Calcium, Total: 8.9 mg/dL (ref 8.5–10.1)
Co2: 30 mmol/L (ref 21–32)
Creatinine: 0.84 mg/dL (ref 0.60–1.30)
EGFR (African American): >60
EGFR (Non-African Amer.): >60
Glucose: 116 mg/dL — ABNORMAL HIGH (ref 65–99)
SGOT(AST): 18 U/L (ref 15–37)
SGPT (ALT): 18 U/L (ref 12–78)
Sodium: 132 mmol/L — ABNORMAL LOW (ref 136–145)

## 2013-07-09 LAB — CBC
HGB: 10.5 g/dL — ABNORMAL LOW (ref 12.0–16.0)
MCH: 24.9 pg — ABNORMAL LOW (ref 26.0–34.0)
MCHC: 32 g/dL (ref 32.0–36.0)
RBC: 4.21 10*6/uL (ref 3.80–5.20)
RDW: 27.4 % — ABNORMAL HIGH (ref 11.5–14.5)
WBC: 8.1 10*3/uL (ref 3.6–11.0)

## 2013-07-10 ENCOUNTER — Other Ambulatory Visit: Payer: Self-pay | Admitting: Cardiovascular Disease

## 2013-07-10 NOTE — Telephone Encounter (Signed)
[facility]

## 2013-07-22 ENCOUNTER — Ambulatory Visit: Payer: Self-pay | Admitting: Hematology and Oncology

## 2013-07-24 ENCOUNTER — Telehealth: Payer: Self-pay

## 2013-07-24 LAB — CBC CANCER CENTER
Eosinophil %: 1.3 %
HCT: 34.8 % — ABNORMAL LOW (ref 35.0–47.0)
HGB: 11.4 g/dL — ABNORMAL LOW (ref 12.0–16.0)
Lymphocyte %: 19.5 %
MCH: 27.1 pg (ref 26.0–34.0)
Monocyte #: 0.5 x10 3/mm (ref 0.2–0.9)
Neutrophil %: 71.4 %
Platelet: 261 x10 3/mm (ref 150–440)
RBC: 4.19 10*6/uL (ref 3.80–5.20)
WBC: 7.4 x10 3/mm (ref 3.6–11.0)

## 2013-07-24 LAB — IRON AND TIBC
Iron: 80 ug/dL (ref 50–170)
Unbound Iron-Bind.Cap.: 171 ug/dL

## 2013-07-24 NOTE — Telephone Encounter (Signed)
Pt daughter called and states pt BP was 190-200 around 10 pm. States pt was having pain in chest and took a Nitro aroung 4 pm yesterday, also took another one within the last 20 minutes. Please call pt.

## 2013-07-27 NOTE — Telephone Encounter (Signed)
Spoke w/ Gavin Pound, pt's daughter.  Apologized that I have not called her sooner, as there was a problem with the computer and I did not get her message until now.  She is feeling better today. Offered her an appt this afternoon, but pt states that it wears her out to get ready, so she will keep her appt on Wednesday.  Daughter reports that pt had a good weekend and went out shopping with her.  She will call if pt needs to be seen sooner.

## 2013-07-29 ENCOUNTER — Encounter: Payer: Self-pay | Admitting: Cardiovascular Disease

## 2013-07-29 ENCOUNTER — Ambulatory Visit: Payer: Medicare Other | Admitting: Cardiovascular Disease

## 2013-07-29 ENCOUNTER — Ambulatory Visit (INDEPENDENT_AMBULATORY_CARE_PROVIDER_SITE_OTHER): Payer: Medicare Other | Admitting: Cardiovascular Disease

## 2013-07-29 VITALS — BP 144/82 | HR 69 | Ht 61.0 in | Wt 148.5 lb

## 2013-07-29 DIAGNOSIS — E119 Type 2 diabetes mellitus without complications: Secondary | ICD-10-CM

## 2013-07-29 DIAGNOSIS — I6529 Occlusion and stenosis of unspecified carotid artery: Secondary | ICD-10-CM

## 2013-07-29 DIAGNOSIS — I10 Essential (primary) hypertension: Secondary | ICD-10-CM

## 2013-07-29 DIAGNOSIS — R0602 Shortness of breath: Secondary | ICD-10-CM

## 2013-07-29 DIAGNOSIS — W19XXXD Unspecified fall, subsequent encounter: Secondary | ICD-10-CM

## 2013-07-29 DIAGNOSIS — W19XXXA Unspecified fall, initial encounter: Secondary | ICD-10-CM

## 2013-07-29 NOTE — Patient Instructions (Signed)
Please restart benicar HCTZ for high high blood pressure If blood pressure continues to run high, call the office  Please call us if you have new issues that need to be addressed before your next appt.  Your physician wants you to follow-up in: 3 months.  You will receive a reminder letter in the mail two months in advance. If you don't receive a letter, please call our office to schedule the follow-up appointment.

## 2013-07-29 NOTE — Assessment & Plan Note (Signed)
Weight is down from her baseline. She has been eating less. He should help with her sugars

## 2013-07-29 NOTE — Assessment & Plan Note (Signed)
Blood pressure is elevated today. We w she has this at home and we'll restart her previous dose of Benicar HCTZ 40/25 mg dailyill restart Benicar HCTZ.

## 2013-07-29 NOTE — Assessment & Plan Note (Signed)
Repeat carotid ultrasound in 2015

## 2013-07-29 NOTE — Progress Notes (Signed)
Patient ID: Catherine Holmes, female    DOB: Apr 25, 1928, 79 y.o.   MRN: [id number]  HPI Comments: 79 year old woman with a history of DM,  peripheral vascular disease, 60 to 70% carotid arterial disease, moderate arterial disease of the lower extremities, status post bilateral iliac stenting, moderate renal stenosis, hyperlipidemia, history of severe  hypertension with labile blood pressures, history of episodic shortness of breath and chest squeezing relieved with albuterol/nebs who presents for routine followup. She has a sulfa allergy to loop diuretics. History of falls.   left carotid arterial stenosis estimated at 60-79% disease. Ideal weight 155 pounds   in the hospital at [facility] in 2012 for general malaise, severe hypertension, shortness of breath, chest pain. She had a stress test, lexiscan that showed no significant ischemia. Significant shortness of breath with lexiscan.   hospital admission on 01/08/2012 for shortness of breath and syncope. She was found to have significant anemia. Treated with Lasix for diastolic CHF.  Underlying severe left carotid arterial disease estimated at 70%.   Prior followup with Dr. Welton Flakes in pulmonary with numerous CT scans, MRIs, PFTs, sleep study. She was told that she had hypoxemia overnight and was started on nighttime oxygen.   Recent hospitalization 05/27/2013 for acute on chronic diastolic CHF. She was treated with IV Bumex twice a day with improvement of her symptoms. Getting factor was her anemia with hematocrit of 25.  Since then she has had an iron infusion. She reports that since the infusion, her blood pressure has been very elevated. Systolic pressures typically more than 170 on a regular basis. In the second week of September 2014 she fell outside a cracker barrel and hit her head. She has been slow to recover. She still has a knot on her head. She's not taking Benicar, HCTZ. She is taking Cardura, hydralazine, bystolic.  She is not able to  tolerate statins. She does handle zetia without significant side effects. previously encouraged to take red yeast rice.   Echocardiogram in the hospital August 2014 shows normal ejection fraction, severely elevated right ventricular systolic pressures, mild to moderate TR  EKG shows normal sinus rhythm with rate 69 beats per minute, no significant ST or T wave changes  Outpatient Encounter Prescriptions as of 07/29/2013  Medication Sig Dispense Refill  . albuterol (PROAIR HFA) 108 (90 BASE) MCG/ACT inhaler Inhale 2 puffs into the lungs every 6 (six) hours as needed.  3 Inhaler  0  . albuterol (PROVENTIL) (2.5 MG/3ML) 0.083% nebulizer solution Take 2.5 mg by nebulization every 6 (six) hours as needed.        Marland Kitchen arformoterol (BROVANA) 15 MCG/2ML NEBU Take 2 mLs (15 mcg total) by nebulization 2 (two) times daily.  120 mL  4  . aspirin 81 MG tablet Take 81 mg by mouth daily.        . beta carotene w/minerals (OCUVITE) tablet Take 1 tablet by mouth 2 (two) times daily.      . bumetanide (BUMEX) 1 MG tablet Take 2 tabs BID      . Cholecalciferol (VITAMIN D-3 PO) Take 1,000 Units by mouth.      . citalopram (CELEXA) 20 MG tablet Take 20 mg by mouth daily.      Marland Kitchen dexlansoprazole (DEXILANT) 60 MG capsule Take 1 capsule (60 mg total) by mouth daily.  90 capsule  0  . diphenhydramine-acetaminophen (TYLENOL PM) 25-500 MG TABS Take 1 tablet by mouth at bedtime as needed.      . ezetimibe (ZETIA)  10 MG tablet Take 1 tablet (10 mg total) by mouth daily.  90 tablet  3  . ferrous sulfate 325 (65 FE) MG tablet Take 325 mg by mouth every other day.      . gabapentin (NEURONTIN) 100 MG capsule Take 100 mg by mouth 2 (two) times daily.      . hydrALAZINE (APRESOLINE) 25 MG tablet Take 12.5 mg by mouth as needed.       . hydrALAZINE (APRESOLINE) 50 MG tablet Take 50 mg by mouth 2 (two) times daily. Take 1/2 tablet twice a day prn      . HYDROcodone-acetaminophen (NORCO/VICODIN) 5-325 MG per tablet Take 1 tablet by  mouth as needed.       . metFORMIN (GLUCOPHAGE) 500 MG tablet Take 500 mg by mouth 2 (two) times daily with a meal.       . mometasone-formoterol (DULERA) 100-5 MCG/ACT AERO Inhale 2 puffs into the lungs.      . Multiple Vitamin (MULTIVITAMIN) tablet Take 1 tablet by mouth daily.        . nebivolol (BYSTOLIC) 5 MG tablet Take 5 mg by mouth daily.      . nitroGLYCERIN (NITROSTAT) 0.4 MG SL tablet Place 1 tablet (0.4 mg total) under the tongue every 5 (five) minutes as needed for chest pain.  25 tablet  3  . potassium chloride (K-DUR,KLOR-CON) 10 MEQ tablet Take 0.5 tablets (5 mEq total) by mouth 2 (two) times daily.  60 tablet  3  . spironolactone (ALDACTONE) 50 MG tablet Take 1 tablet daily      . tiotropium (SPIRIVA) 18 MCG inhalation capsule Place 1 capsule (18 mcg total) into inhaler and inhale daily.  90 capsule  0    Review of Systems  Constitutional: Positive for fatigue.  HENT: Negative.   Eyes: Negative.   Respiratory: Negative.   Cardiovascular: Negative.   Gastrointestinal: Negative.   Endocrine: Negative.   Musculoskeletal: Positive for arthralgias, gait problem and myalgias.  Skin: Negative.   Allergic/Immunologic: Negative.   Neurological: Positive for weakness.  Hematological: Negative.   Psychiatric/Behavioral: Negative.   All other systems reviewed and are negative.    BP 144/82  Pulse 69  Ht 5\' 1"  (1.549 m)  Wt 148 lb 8 oz (67.359 kg)  BMI 28.07 kg/m2  Physical Exam  Nursing note and vitals reviewed. Constitutional: She is oriented to person, place, and time. She appears well-developed and well-nourished.  HENT:  Head: Normocephalic.  Nose: Nose normal.  Mouth/Throat: Oropharynx is clear and moist.  Eyes: Conjunctivae are normal. Pupils are equal, round, and reactive to light.  Neck: Normal range of motion. Neck supple. No JVD present. Carotid bruit is present.  Cardiovascular: Normal rate, regular rhythm, S1 normal, S2 normal and intact distal pulses.   Exam reveals no gallop and no friction rub.   Murmur heard.  Crescendo systolic murmur is present with a grade of 2/6  Pulmonary/Chest: Effort normal. No respiratory distress. She has decreased breath sounds. She has no wheezes. She has no rales. She exhibits no tenderness.  Abdominal: Soft. Bowel sounds are normal. She exhibits no distension. There is no tenderness.  Musculoskeletal: Normal range of motion. She exhibits no edema and no tenderness.  Lymphadenopathy:    She has no cervical adenopathy.  Neurological: She is alert and oriented to person, place, and time. Coordination normal.  Skin: Skin is warm and dry. No rash noted. No erythema.  Psychiatric: She has a normal mood and affect. Her behavior  is normal. Judgment and thought content normal.    Assessment and Plan

## 2013-07-29 NOTE — Assessment & Plan Note (Signed)
Recent fall with injury to her head. Encouraged her to participate in the balance classes/exercise locally. She continues to walk with a cane, sometimes a walker.

## 2013-08-22 ENCOUNTER — Ambulatory Visit: Payer: Self-pay | Admitting: Hematology and Oncology

## 2013-10-06 ENCOUNTER — Encounter: Payer: Self-pay | Admitting: Cardiovascular Disease

## 2013-10-06 ENCOUNTER — Ambulatory Visit (INDEPENDENT_AMBULATORY_CARE_PROVIDER_SITE_OTHER): Payer: Medicare Other | Admitting: Cardiovascular Disease

## 2013-10-06 VITALS — BP 160/82 | HR 61 | Ht 61.0 in | Wt 160.0 lb

## 2013-10-06 DIAGNOSIS — I5032 Chronic diastolic (congestive) heart failure: Secondary | ICD-10-CM

## 2013-10-06 DIAGNOSIS — I059 Rheumatic mitral valve disease, unspecified: Secondary | ICD-10-CM

## 2013-10-06 DIAGNOSIS — I509 Heart failure, unspecified: Secondary | ICD-10-CM

## 2013-10-06 DIAGNOSIS — R0602 Shortness of breath: Secondary | ICD-10-CM

## 2013-10-06 DIAGNOSIS — I10 Essential (primary) hypertension: Secondary | ICD-10-CM

## 2013-10-06 NOTE — Assessment & Plan Note (Signed)
Acute on chronic diastolic CHF today. Weight is up 12 pounds. We have recommended she restart Bumex 2 mg daily. We will see her again in one week

## 2013-10-06 NOTE — Patient Instructions (Signed)
You have extra fluid in you today Weight is up 12 pounds Please measure your weight at home Start bumex 2 mg every day Monitor your weight daily  Please call us if you have new issues that need to be addressed before your next appt.  Your physician wants you to follow-up in: next Tuesday

## 2013-10-06 NOTE — Progress Notes (Signed)
Patient ID: Catherine Holmes, female    DOB: 12/27/1927, 79 y.o.   MRN: [id number]  HPI Comments: 79 year old woman with a history of DM,  peripheral vascular disease, 60 to 70% carotid arterial disease, moderate arterial disease of the lower extremities, status post bilateral iliac stenting, moderate renal stenosis, hyperlipidemia, history of severe  hypertension with labile blood pressures, history of episodic shortness of breath and chest squeezing relieved with albuterol/nebs who presents for routine followup. She has a sulfa allergy to loop diuretics. History of falls.   left carotid arterial stenosis estimated at 60-79% disease.  Previous weight October 2014 was 148 pounds. She presents today at 160 pounds. She has increasing shortness of breath, has not been taking Bumex, as a tightness around her chest like her bra is very tight. She has general malaise, cough, some wheezing. She is going to the beach next week. She has been eating out frequently for lunch and dinners. Denies having worsening leg edema, does have some bloating in her abdomen, tight in the chest. Cough when supine   in the hospital at [facility] in 2012 for general malaise, severe hypertension, shortness of breath, chest pain. She had a stress test, lexiscan that showed no significant ischemia. Significant shortness of breath with lexiscan.   hospital admission on 01/08/2012 for shortness of breath and syncope. She was found to have significant anemia. Treated with Lasix for diastolic CHF.  Underlying severe left carotid arterial disease estimated at 70%.   Prior followup with Dr. Welton Flakes in pulmonary with numerous CT scans, MRIs, PFTs, sleep study. She was told that she had hypoxemia overnight and was started on nighttime oxygen.   Recent hospitalization 05/27/2013 for acute on chronic diastolic CHF. She was treated with IV Bumex twice a day with improvement of her symptoms. Getting factor was her anemia with hematocrit of 25.  Since  then she has had an iron infusion. She reports that since the infusion, her blood pressure has been very elevated. Systolic pressures typically more than 170 on a regular basis. In the second week of September 2014 she fell outside a cracker barrel and hit her head. She has been slow to recover. She still has a knot on her head. She's not taking Benicar, HCTZ. She is taking Cardura, hydralazine, bystolic.  She is not able to tolerate statins. She does handle zetia without significant side effects. previously encouraged to take red yeast rice.   Echocardiogram in the hospital August 2014 shows normal ejection fraction, severely elevated right ventricular systolic pressures, mild to moderate TR  EKG shows normal sinus rhythm with rate 61 beats per minute, no significant ST or T wave changes  Outpatient Encounter Prescriptions as of 10/06/2013  Medication Sig  . albuterol (PROAIR HFA) 108 (90 BASE) MCG/ACT inhaler Inhale 2 puffs into the lungs every 6 (six) hours as needed.  Marland Kitchen albuterol (PROVENTIL) (2.5 MG/3ML) 0.083% nebulizer solution Take 2.5 mg by nebulization every 6 (six) hours as needed.    Marland Kitchen arformoterol (BROVANA) 15 MCG/2ML NEBU Take 2 mLs (15 mcg total) by nebulization 2 (two) times daily.  Marland Kitchen aspirin 81 MG tablet Take 81 mg by mouth daily.    . beta carotene w/minerals (OCUVITE) tablet Take 1 tablet by mouth 2 (two) times daily.  . bumetanide (BUMEX) 1 MG tablet Take 2 tabs BID  . Cholecalciferol (VITAMIN D-3 PO) Take 1,000 Units by mouth.  . citalopram (CELEXA) 20 MG tablet Take 20 mg by mouth daily.  Marland Kitchen dexlansoprazole (DEXILANT) 60  MG capsule Take 60 mg by mouth daily as needed.  . diphenhydramine-acetaminophen (TYLENOL PM) 25-500 MG TABS Take 1 tablet by mouth at bedtime as needed.  . ezetimibe (ZETIA) 10 MG tablet Take 1 tablet (10 mg total) by mouth daily.  . ferrous sulfate 325 (65 FE) MG tablet Take 325 mg by mouth every other day.  . gabapentin (NEURONTIN) 100 MG capsule Take  100 mg by mouth daily.   . hydrALAZINE (APRESOLINE) 25 MG tablet Take 12.5 mg by mouth as needed.   . hydrALAZINE (APRESOLINE) 50 MG tablet Take 50 mg by mouth 2 (two) times daily. Take 1/2 tablet twice a day prn  . HYDROcodone-acetaminophen (NORCO/VICODIN) 5-325 MG per tablet Take 1 tablet by mouth as needed.   . metFORMIN (GLUCOPHAGE) 500 MG tablet Take 500 mg by mouth daily with breakfast.   . mometasone-formoterol (DULERA) 100-5 MCG/ACT AERO Inhale 2 puffs into the lungs.  . Multiple Vitamin (MULTIVITAMIN) tablet Take 1 tablet by mouth daily.    . nebivolol (BYSTOLIC) 5 MG tablet Take 5 mg by mouth daily.  . nitroGLYCERIN (NITROSTAT) 0.4 MG SL tablet Place 1 tablet (0.4 mg total) under the tongue every 5 (five) minutes as needed for chest pain.  Marland Kitchen olmesartan (BENICAR) 40 MG tablet Take 40 mg by mouth daily.  . potassium chloride (K-DUR,KLOR-CON) 10 MEQ tablet Take 0.5 tablets (5 mEq total) by mouth 2 (two) times daily.  Marland Kitchen spironolactone (ALDACTONE) 50 MG tablet Take 1 tablet daily  . tiotropium (SPIRIVA) 18 MCG inhalation capsule Place 1 capsule (18 mcg total) into inhaler and inhale daily.  . [DISCONTINUED] dexlansoprazole (DEXILANT) 60 MG capsule Take 1 capsule (60 mg total) by mouth daily.    Review of Systems  Constitutional: Positive for fatigue and unexpected weight change.  HENT: Negative.   Eyes: Negative.   Respiratory: Positive for cough and shortness of breath.   Cardiovascular: Negative.   Gastrointestinal: Negative.   Endocrine: Negative.   Musculoskeletal: Positive for arthralgias, gait problem and myalgias.  Skin: Negative.   Allergic/Immunologic: Negative.   Neurological: Positive for weakness.  Hematological: Negative.   Psychiatric/Behavioral: Negative.   All other systems reviewed and are negative.    BP 160/82  Pulse 61  Ht 5\' 1"  (1.549 m)  Wt 160 lb (72.576 kg)  BMI 30.25 kg/m2  Physical Exam  Nursing note and vitals reviewed. Constitutional: She is  oriented to person, place, and time. She appears well-developed and well-nourished.  HENT:  Head: Normocephalic.  Nose: Nose normal.  Mouth/Throat: Oropharynx is clear and moist.  Eyes: Conjunctivae are normal. Pupils are equal, round, and reactive to light.  Neck: Normal range of motion. Neck supple. No JVD present. Carotid bruit is present.  Cardiovascular: Normal rate, regular rhythm, S1 normal, S2 normal and intact distal pulses.  Exam reveals no gallop and no friction rub.   Murmur heard.  Crescendo systolic murmur is present with a grade of 2/6  Pulmonary/Chest: Effort normal. No respiratory distress. She has decreased breath sounds. She has no wheezes. She has no rales. She exhibits no tenderness.  Abdominal: Soft. Bowel sounds are normal. She exhibits no distension. There is no tenderness.  Musculoskeletal: Normal range of motion. She exhibits no edema and no tenderness.  Lymphadenopathy:    She has no cervical adenopathy.  Neurological: She is alert and oriented to person, place, and time. Coordination normal.  Skin: Skin is warm and dry. No rash noted. No erythema.  Psychiatric: She has a normal mood and  affect. Her behavior is normal. Judgment and thought content normal.    Assessment and Plan

## 2013-10-06 NOTE — Assessment & Plan Note (Signed)
12 pound weight gain on today's visit since her last visit 2 months ago. We'll restart diuretic

## 2013-10-06 NOTE — Assessment & Plan Note (Signed)
Continue on her current medication regimen. Blood pressure borderline high.

## 2013-10-13 ENCOUNTER — Ambulatory Visit (INDEPENDENT_AMBULATORY_CARE_PROVIDER_SITE_OTHER): Payer: Medicare Other | Admitting: Cardiovascular Disease

## 2013-10-13 ENCOUNTER — Encounter: Payer: Self-pay | Admitting: Cardiovascular Disease

## 2013-10-13 VITALS — BP 130/72 | HR 68 | Ht 61.0 in | Wt 155.8 lb

## 2013-10-13 DIAGNOSIS — I509 Heart failure, unspecified: Secondary | ICD-10-CM

## 2013-10-13 DIAGNOSIS — I5032 Chronic diastolic (congestive) heart failure: Secondary | ICD-10-CM

## 2013-10-13 DIAGNOSIS — I6529 Occlusion and stenosis of unspecified carotid artery: Secondary | ICD-10-CM

## 2013-10-13 DIAGNOSIS — I10 Essential (primary) hypertension: Secondary | ICD-10-CM

## 2013-10-13 NOTE — Patient Instructions (Signed)
You are doing well.  Please continue bumex at least every other day Hold the bumex for weight less than 250 pounds Take bumex daily for weight >160 pounds   Please call us if you have new issues that need to be addressed before your next appt.  Your physician wants you to follow-up in: 6 months.  You will receive a reminder letter in the mail two months in advance. If you don't receive a letter, please call our office to schedule the follow-up appointment.

## 2013-10-13 NOTE — Progress Notes (Signed)
Patient ID: Catherine Holmes, female    DOB: April 16, 1928, 79 y.o.   MRN: [id number]  HPI Comments: 79 year old woman with a history of DM,  peripheral vascular disease, 60 to 70% carotid arterial disease, moderate arterial disease of the lower extremities, status post bilateral iliac stenting, moderate renal stenosis, hyperlipidemia, history of severe  hypertension with labile blood pressures, history of episodic shortness of breath and chest squeezing relieved with albuterol/nebs who presents for routine followup. She has a sulfa allergy to loop diuretics. History of falls.   left carotid arterial stenosis estimated at 60-79% disease.  Previous weight October 2014 was 148 pounds. On her last office visit one week ago, weight was 160 pounds, she had increasing shortness of breath, had not been taking Bumex, had tightness around her chest like her bra was very tight. S general malaise, cough, some wheezing.  We suggested she start Bumex daily. In followup today, her weight is down 5 pounds or more, she feels better, no cough, good energy, no shortness of breath, no chest tightness. They're heading to the beach today. She has been very active for the past 2 days packing the car.   in the hospital at [facility] in 2012 for general malaise, severe hypertension, shortness of breath, chest pain. She had a stress test, lexiscan that showed no significant ischemia. Significant shortness of breath with lexiscan.   hospital admission on 01/08/2012 for shortness of breath and syncope. She was found to have significant anemia. Treated with Lasix for diastolic CHF.  Underlying severe left carotid arterial disease estimated at 70%.   Prior followup with Dr. Welton Flakes in pulmonary with numerous CT scans, MRIs, PFTs, sleep study. She was told that she had hypoxemia overnight and was started on nighttime oxygen.   Recent hospitalization 05/27/2013 for acute on chronic diastolic CHF. She was treated with IV Bumex twice a day with  improvement of her symptoms. Getting factor was her anemia with hematocrit of 25.  Previous iron infusion. In the second week of September 2014 she fell outside a cracker barrel and hit her head. She has been slow to recover. She still has a knot on her head.  She is not able to tolerate statins. She does handle zetia without significant side effects. previously encouraged to take red yeast rice.   Echocardiogram in the hospital August 2014 shows normal ejection fraction, severely elevated right ventricular systolic pressures, mild to moderate TR  Outpatient Encounter Prescriptions as of 10/13/2013  Medication Sig  . albuterol (PROAIR HFA) 108 (90 BASE) MCG/ACT inhaler Inhale 2 puffs into the lungs every 6 (six) hours as needed.  Marland Kitchen albuterol (PROVENTIL) (2.5 MG/3ML) 0.083% nebulizer solution Take 2.5 mg by nebulization every 6 (six) hours as needed.    Marland Kitchen arformoterol (BROVANA) 15 MCG/2ML NEBU Take 2 mLs (15 mcg total) by nebulization 2 (two) times daily.  Marland Kitchen aspirin 81 MG tablet Take 81 mg by mouth daily.    . beta carotene w/minerals (OCUVITE) tablet Take 1 tablet by mouth 2 (two) times daily.  . bumetanide (BUMEX) 1 MG tablet Take 2 tabs BID  . Cholecalciferol (VITAMIN D-3 PO) Take 1,000 Units by mouth.  . citalopram (CELEXA) 20 MG tablet Take 20 mg by mouth daily.  Marland Kitchen dexlansoprazole (DEXILANT) 60 MG capsule Take 60 mg by mouth daily as needed.  . diphenhydramine-acetaminophen (TYLENOL PM) 25-500 MG TABS Take 1 tablet by mouth at bedtime as needed.  . doxazosin (CARDURA) 4 MG tablet Take 4 mg by mouth at  bedtime.   Marland Kitchen ezetimibe (ZETIA) 10 MG tablet Take 1 tablet (10 mg total) by mouth daily.  . ferrous sulfate 325 (65 FE) MG tablet Take 325 mg by mouth every other day.  . gabapentin (NEURONTIN) 100 MG capsule Take 100 mg by mouth daily.   . hydrALAZINE (APRESOLINE) 25 MG tablet Take 12.5 mg by mouth as needed.   . hydrALAZINE (APRESOLINE) 50 MG tablet Take 50 mg by mouth 2 (two) times  daily. Take 1/2 tablet twice a day prn  . HYDROcodone-acetaminophen (NORCO/VICODIN) 5-325 MG per tablet Take 1 tablet by mouth as needed.   . metFORMIN (GLUCOPHAGE) 500 MG tablet Take 500 mg by mouth daily with breakfast.   . mometasone-formoterol (DULERA) 100-5 MCG/ACT AERO Inhale 2 puffs into the lungs.  . Multiple Vitamin (MULTIVITAMIN) tablet Take 1 tablet by mouth daily.    . nebivolol (BYSTOLIC) 5 MG tablet Take 5 mg by mouth daily.  . nitroGLYCERIN (NITROSTAT) 0.4 MG SL tablet Place 1 tablet (0.4 mg total) under the tongue every 5 (five) minutes as needed for chest pain.  Marland Kitchen olmesartan (BENICAR) 40 MG tablet Take 40 mg by mouth daily.  . potassium chloride (K-DUR,KLOR-CON) 10 MEQ tablet Take 0.5 tablets (5 mEq total) by mouth 2 (two) times daily.  Marland Kitchen spironolactone (ALDACTONE) 50 MG tablet Take 1 tablet daily  . tiotropium (SPIRIVA) 18 MCG inhalation capsule Place 1 capsule (18 mcg total) into inhaler and inhale daily.    Review of Systems  HENT: Negative.   Eyes: Negative.   Cardiovascular: Negative.   Gastrointestinal: Negative.   Endocrine: Negative.   Musculoskeletal: Positive for arthralgias, gait problem and myalgias.  Skin: Negative.   Allergic/Immunologic: Negative.   Hematological: Negative.   Psychiatric/Behavioral: Negative.   All other systems reviewed and are negative.    BP 130/72  Pulse 68  Ht 5\' 1"  (1.549 m)  Wt 155 lb 12 oz (70.648 kg)  BMI 29.44 kg/m2  Physical Exam  Nursing note and vitals reviewed. Constitutional: She is oriented to person, place, and time. She appears well-developed and well-nourished.  HENT:  Head: Normocephalic.  Nose: Nose normal.  Mouth/Throat: Oropharynx is clear and moist.  Eyes: Conjunctivae are normal. Pupils are equal, round, and reactive to light.  Neck: Normal range of motion. Neck supple. No JVD present. Carotid bruit is present.  Cardiovascular: Normal rate, regular rhythm, S1 normal, S2 normal and intact distal  pulses.  Exam reveals no gallop and no friction rub.   Murmur heard.  Crescendo systolic murmur is present with a grade of 2/6  Pulmonary/Chest: Effort normal. No respiratory distress. She has decreased breath sounds. She has no wheezes. She has no rales. She exhibits no tenderness.  Abdominal: Soft. Bowel sounds are normal. She exhibits no distension. There is no tenderness.  Musculoskeletal: Normal range of motion. She exhibits no edema and no tenderness.  Lymphadenopathy:    She has no cervical adenopathy.  Neurological: She is alert and oriented to person, place, and time. Coordination normal.  Skin: Skin is warm and dry. No rash noted. No erythema.  Psychiatric: She has a normal mood and affect. Her behavior is normal. Judgment and thought content normal.    Assessment and Plan

## 2013-10-13 NOTE — Assessment & Plan Note (Signed)
Blood pressure is well controlled on today's visit. No changes made to the medications. Blood pressure improved after diuresis

## 2013-10-13 NOTE — Assessment & Plan Note (Signed)
Repeat ultrasound scheduled for 2015

## 2013-10-13 NOTE — Assessment & Plan Note (Signed)
Weight is down 5 pounds on Bumex daily. We have suggested she take Bumex every other day. Hold Bumex for weight less than 150 pounds. Take Bumex daily for weight more than 160 pounds

## 2013-10-30 ENCOUNTER — Ambulatory Visit: Payer: Self-pay | Admitting: Hematology and Oncology

## 2013-10-30 LAB — CBC CANCER CENTER
BASOS PCT: 1 %
Basophil #: 0.1 x10 3/mm (ref 0.0–0.1)
EOS ABS: 0.1 x10 3/mm (ref 0.0–0.7)
EOS PCT: 1.4 %
HCT: 25.2 % — AB (ref 35.0–47.0)
HGB: 8 g/dL — ABNORMAL LOW (ref 12.0–16.0)
LYMPHS PCT: 11.5 %
Lymphocyte #: 0.9 x10 3/mm — ABNORMAL LOW (ref 1.0–3.6)
MCH: 28 pg (ref 26.0–34.0)
MCHC: 31.6 g/dL — AB (ref 32.0–36.0)
MCV: 89 fL (ref 80–100)
MONO ABS: 0.6 x10 3/mm (ref 0.2–0.9)
Monocyte %: 8.4 %
Neutrophil #: 5.7 x10 3/mm (ref 1.4–6.5)
Neutrophil %: 77.7 %
Platelet: 312 x10 3/mm (ref 150–440)
RBC: 2.85 10*6/uL — AB (ref 3.80–5.20)
RDW: 14.4 % (ref 11.5–14.5)
WBC: 7.4 x10 3/mm (ref 3.6–11.0)

## 2013-10-30 LAB — COMPREHENSIVE METABOLIC PANEL
ALBUMIN: 3.6 g/dL (ref 3.4–5.0)
ALT: 24 U/L (ref 12–78)
AST: 16 U/L (ref 15–37)
Alkaline Phosphatase: 86 U/L
Anion Gap: 6 — ABNORMAL LOW (ref 7–16)
BUN: 17 mg/dL (ref 7–18)
Bilirubin,Total: 0.3 mg/dL (ref 0.2–1.0)
CALCIUM: 9 mg/dL (ref 8.5–10.1)
CHLORIDE: 94 mmol/L — AB (ref 98–107)
Co2: 30 mmol/L (ref 21–32)
Creatinine: 0.77 mg/dL (ref 0.60–1.30)
EGFR (African American): >60
Glucose: 152 mg/dL — ABNORMAL HIGH (ref 65–99)
Osmolality: 265 (ref 275–301)
POTASSIUM: 4.8 mmol/L (ref 3.5–5.1)
Sodium: 130 mmol/L — ABNORMAL LOW (ref 136–145)
Total Protein: 6.4 g/dL (ref 6.4–8.2)

## 2013-11-01 LAB — CBC
HCT: 25.4 % — AB (ref 35.0–47.0)
HGB: 8.4 g/dL — AB (ref 12.0–16.0)
MCH: 28.9 pg (ref 26.0–34.0)
MCHC: 33.1 g/dL (ref 32.0–36.0)
MCV: 88 fL (ref 80–100)
Platelet: 329 10*3/uL (ref 150–440)
RBC: 2.91 10*6/uL — ABNORMAL LOW (ref 3.80–5.20)
RDW: 14.8 % — AB (ref 11.5–14.5)
WBC: 7.5 10*3/uL (ref 3.6–11.0)

## 2013-11-01 LAB — BASIC METABOLIC PANEL
Anion Gap: 1 — ABNORMAL LOW (ref 7–16)
BUN: 14 mg/dL (ref 7–18)
CALCIUM: 8.8 mg/dL (ref 8.5–10.1)
CHLORIDE: 94 mmol/L — AB (ref 98–107)
CREATININE: 0.67 mg/dL (ref 0.60–1.30)
Co2: 34 mmol/L — ABNORMAL HIGH (ref 21–32)
EGFR (African American): >60
EGFR (Non-African Amer.): >60
GLUCOSE: 101 mg/dL — AB (ref 65–99)
OSMOLALITY: 260 (ref 275–301)
POTASSIUM: 4.4 mmol/L (ref 3.5–5.1)
Sodium: 129 mmol/L — ABNORMAL LOW (ref 136–145)

## 2013-11-02 ENCOUNTER — Inpatient Hospital Stay: Payer: Self-pay | Admitting: Family Medicine

## 2013-11-02 LAB — PROTIME-INR
INR: 1.1
Prothrombin Time: 13.8 secs (ref 11.5–14.7)

## 2013-11-02 LAB — CK TOTAL AND CKMB (NOT AT ARMC)
CK, Total: 64 U/L (ref 21–215)
CK, Total: 49 U/L (ref 21–215)
CK, Total: 47 U/L (ref 21–215)
CK-MB: 2.1 ng/mL (ref 0.5–3.6)
CK-MB: 2 ng/mL (ref 0.5–3.6)
CK-MB: 2 ng/mL (ref 0.5–3.6)

## 2013-11-02 LAB — TROPONIN I
TROPONIN-I: 0.03 ng/mL
Troponin-I: 0.03 ng/mL

## 2013-11-02 LAB — PRO B NATRIURETIC PEPTIDE: B-Type Natriuretic Peptide: 2126 pg/mL — ABNORMAL HIGH (ref 0–450)

## 2013-11-03 DIAGNOSIS — I059 Rheumatic mitral valve disease, unspecified: Secondary | ICD-10-CM

## 2013-11-03 LAB — CBC WITH DIFFERENTIAL/PLATELET
Basophil #: 0 10*3/uL (ref 0.0–0.1)
Basophil %: 0.5 %
EOS ABS: 0.2 10*3/uL (ref 0.0–0.7)
Eosinophil %: 2.1 %
HCT: 26 % — AB (ref 35.0–47.0)
HGB: 8.7 g/dL — ABNORMAL LOW (ref 12.0–16.0)
LYMPHS PCT: 17.7 %
Lymphocyte #: 1.5 10*3/uL (ref 1.0–3.6)
MCH: 29.5 pg (ref 26.0–34.0)
MCHC: 33.3 g/dL (ref 32.0–36.0)
MCV: 89 fL (ref 80–100)
MONO ABS: 1.1 x10 3/mm — AB (ref 0.2–0.9)
Monocyte %: 12.3 %
NEUTROS PCT: 67.4 %
Neutrophil #: 5.8 10*3/uL (ref 1.4–6.5)
Platelet: 335 10*3/uL (ref 150–440)
RBC: 2.94 10*6/uL — ABNORMAL LOW (ref 3.80–5.20)
RDW: 14.8 % — ABNORMAL HIGH (ref 11.5–14.5)
WBC: 8.7 10*3/uL (ref 3.6–11.0)

## 2013-11-03 LAB — BASIC METABOLIC PANEL
Anion Gap: 3 — ABNORMAL LOW (ref 7–16)
BUN: 15 mg/dL (ref 7–18)
CO2: 34 mmol/L — AB (ref 21–32)
Calcium, Total: 8.9 mg/dL (ref 8.5–10.1)
Chloride: 91 mmol/L — ABNORMAL LOW (ref 98–107)
Creatinine: 0.54 mg/dL — ABNORMAL LOW (ref 0.60–1.30)
EGFR (African American): >60
EGFR (Non-African Amer.): >60
GLUCOSE: 110 mg/dL — AB (ref 65–99)
Osmolality: 259 (ref 275–301)
POTASSIUM: 4.3 mmol/L (ref 3.5–5.1)
Sodium: 128 mmol/L — ABNORMAL LOW (ref 136–145)

## 2013-11-03 LAB — TSH: THYROID STIMULATING HORM: 1.49 u[IU]/mL

## 2013-11-04 LAB — CBC WITH DIFFERENTIAL/PLATELET
Basophil #: 0.1 10*3/uL (ref 0.0–0.1)
Basophil %: 0.8 %
EOS PCT: 2.1 %
Eosinophil #: 0.2 10*3/uL (ref 0.0–0.7)
HCT: 26 % — AB (ref 35.0–47.0)
HGB: 8.5 g/dL — ABNORMAL LOW (ref 12.0–16.0)
Lymphocyte #: 1.4 10*3/uL (ref 1.0–3.6)
Lymphocyte %: 13.1 %
MCH: 28.8 pg (ref 26.0–34.0)
MCHC: 32.6 g/dL (ref 32.0–36.0)
MCV: 88 fL (ref 80–100)
MONO ABS: 1.2 x10 3/mm — AB (ref 0.2–0.9)
Monocyte %: 11.4 %
NEUTROS ABS: 7.8 10*3/uL — AB (ref 1.4–6.5)
Neutrophil %: 72.6 %
Platelet: 346 10*3/uL (ref 150–440)
RBC: 2.95 10*6/uL — ABNORMAL LOW (ref 3.80–5.20)
RDW: 15.3 % — ABNORMAL HIGH (ref 11.5–14.5)
WBC: 10.7 10*3/uL (ref 3.6–11.0)

## 2013-11-04 LAB — BASIC METABOLIC PANEL
Anion Gap: 1 — ABNORMAL LOW (ref 7–16)
BUN: 10 mg/dL (ref 7–18)
CO2: 36 mmol/L — AB (ref 21–32)
Calcium, Total: 9 mg/dL (ref 8.5–10.1)
Chloride: 89 mmol/L — ABNORMAL LOW (ref 98–107)
Creatinine: 0.52 mg/dL — ABNORMAL LOW (ref 0.60–1.30)
EGFR (African American): >60
EGFR (Non-African Amer.): >60
GLUCOSE: 101 mg/dL — AB (ref 65–99)
Osmolality: 253 (ref 275–301)
POTASSIUM: 4 mmol/L (ref 3.5–5.1)
SODIUM: 126 mmol/L — AB (ref 136–145)

## 2013-11-04 LAB — OSMOLALITY: Osmolality: 270 mOsm/kg — ABNORMAL LOW (ref 280–301)

## 2013-11-05 DIAGNOSIS — I5033 Acute on chronic diastolic (congestive) heart failure: Secondary | ICD-10-CM

## 2013-11-05 LAB — CBC WITH DIFFERENTIAL/PLATELET
Basophil #: 0.1 10*3/uL (ref 0.0–0.1)
Basophil %: 0.7 %
Eosinophil #: 0.2 10*3/uL (ref 0.0–0.7)
Eosinophil %: 2.3 %
HCT: 28.1 % — ABNORMAL LOW (ref 35.0–47.0)
HGB: 9.4 g/dL — AB (ref 12.0–16.0)
LYMPHS PCT: 12.1 %
Lymphocyte #: 1.3 10*3/uL (ref 1.0–3.6)
MCH: 29.2 pg (ref 26.0–34.0)
MCHC: 33.4 g/dL (ref 32.0–36.0)
MCV: 88 fL (ref 80–100)
MONO ABS: 1.1 x10 3/mm — AB (ref 0.2–0.9)
Monocyte %: 10.8 %
NEUTROS ABS: 7.8 10*3/uL — AB (ref 1.4–6.5)
Neutrophil %: 74.1 %
Platelet: 349 10*3/uL (ref 150–440)
RBC: 3.21 10*6/uL — AB (ref 3.80–5.20)
RDW: 15.7 % — AB (ref 11.5–14.5)
WBC: 10.5 10*3/uL (ref 3.6–11.0)

## 2013-11-05 LAB — BASIC METABOLIC PANEL
Anion Gap: 3 — ABNORMAL LOW (ref 7–16)
BUN: 10 mg/dL (ref 7–18)
Calcium, Total: 9.3 mg/dL (ref 8.5–10.1)
Chloride: 87 mmol/L — ABNORMAL LOW (ref 98–107)
Co2: 35 mmol/L — ABNORMAL HIGH (ref 21–32)
Creatinine: 0.48 mg/dL — ABNORMAL LOW (ref 0.60–1.30)
EGFR (Non-African Amer.): >60
GLUCOSE: 123 mg/dL — AB (ref 65–99)
Osmolality: 252 (ref 275–301)
Potassium: 3.8 mmol/L (ref 3.5–5.1)
Sodium: 125 mmol/L — ABNORMAL LOW (ref 136–145)

## 2013-11-05 LAB — SODIUM
SODIUM: 124 mmol/L — AB (ref 136–145)
Sodium: 129 mmol/L — ABNORMAL LOW (ref 136–145)

## 2013-11-05 LAB — OSMOLALITY, URINE: Osmolality: 457 mOsm/kg

## 2013-11-06 LAB — CBC WITH DIFFERENTIAL/PLATELET
Basophil #: 0.1 10*3/uL (ref 0.0–0.1)
Basophil %: 1 %
EOS PCT: 3.6 %
Eosinophil #: 0.3 10*3/uL (ref 0.0–0.7)
HCT: 31 % — ABNORMAL LOW (ref 35.0–47.0)
HGB: 10.2 g/dL — ABNORMAL LOW (ref 12.0–16.0)
Lymphocyte #: 1.1 10*3/uL (ref 1.0–3.6)
Lymphocyte %: 11.9 %
MCH: 29.1 pg (ref 26.0–34.0)
MCHC: 32.9 g/dL (ref 32.0–36.0)
MCV: 89 fL (ref 80–100)
MONO ABS: 1 x10 3/mm — AB (ref 0.2–0.9)
MONOS PCT: 10.3 %
Neutrophil #: 6.8 10*3/uL — ABNORMAL HIGH (ref 1.4–6.5)
Neutrophil %: 73.2 %
Platelet: 389 10*3/uL (ref 150–440)
RBC: 3.5 10*6/uL — AB (ref 3.80–5.20)
RDW: 16.4 % — AB (ref 11.5–14.5)
WBC: 9.3 10*3/uL (ref 3.6–11.0)

## 2013-11-06 LAB — BASIC METABOLIC PANEL
Anion Gap: 2 — ABNORMAL LOW (ref 7–16)
BUN: 12 mg/dL (ref 7–18)
CO2: 38 mmol/L — AB (ref 21–32)
Calcium, Total: 9.3 mg/dL (ref 8.5–10.1)
Chloride: 92 mmol/L — ABNORMAL LOW (ref 98–107)
Creatinine: 0.52 mg/dL — ABNORMAL LOW (ref 0.60–1.30)
EGFR (Non-African Amer.): >60
GLUCOSE: 109 mg/dL — AB (ref 65–99)
Osmolality: 265 (ref 275–301)
Potassium: 4.1 mmol/L (ref 3.5–5.1)
Sodium: 132 mmol/L — ABNORMAL LOW (ref 136–145)

## 2013-11-06 LAB — SODIUM
SODIUM: 134 mmol/L — AB (ref 136–145)
SODIUM: 133 mmol/L — AB (ref 136–145)
SODIUM: 134 mmol/L — AB (ref 136–145)
Sodium: 132 mmol/L — ABNORMAL LOW (ref 136–145)
Sodium: 132 mmol/L — ABNORMAL LOW (ref 136–145)

## 2013-11-07 LAB — BASIC METABOLIC PANEL
ANION GAP: 3 — AB (ref 7–16)
BUN: 12 mg/dL (ref 7–18)
CHLORIDE: 91 mmol/L — AB (ref 98–107)
CREATININE: 0.56 mg/dL — AB (ref 0.60–1.30)
Calcium, Total: 9 mg/dL (ref 8.5–10.1)
Co2: 36 mmol/L — ABNORMAL HIGH (ref 21–32)
EGFR (Non-African Amer.): >60
Glucose: 96 mg/dL (ref 65–99)
Osmolality: 260 (ref 275–301)
Potassium: 4.2 mmol/L (ref 3.5–5.1)
SODIUM: 130 mmol/L — AB (ref 136–145)

## 2013-11-07 LAB — HEMOGLOBIN: HGB: 10.3 g/dL — ABNORMAL LOW (ref 12.0–16.0)

## 2013-11-07 LAB — CLOSTRIDIUM DIFFICILE(ARMC)

## 2013-11-08 LAB — CBC WITH DIFFERENTIAL/PLATELET
BASOS ABS: 0 10*3/uL (ref 0.0–0.1)
Basophil %: 0.6 %
EOS ABS: 0.3 10*3/uL (ref 0.0–0.7)
EOS PCT: 4 %
HCT: 30.9 % — ABNORMAL LOW (ref 35.0–47.0)
HGB: 10.1 g/dL — ABNORMAL LOW (ref 12.0–16.0)
LYMPHS ABS: 1.4 10*3/uL (ref 1.0–3.6)
Lymphocyte %: 16.9 %
MCH: 29 pg (ref 26.0–34.0)
MCHC: 32.7 g/dL (ref 32.0–36.0)
MCV: 89 fL (ref 80–100)
Monocyte #: 0.9 x10 3/mm (ref 0.2–0.9)
Monocyte %: 10.5 %
NEUTROS PCT: 68 %
Neutrophil #: 5.6 10*3/uL (ref 1.4–6.5)
Platelet: 360 10*3/uL (ref 150–440)
RBC: 3.48 10*6/uL — AB (ref 3.80–5.20)
RDW: 17 % — AB (ref 11.5–14.5)
WBC: 8.3 10*3/uL (ref 3.6–11.0)

## 2013-11-08 LAB — WBCS, STOOL

## 2013-11-08 LAB — BASIC METABOLIC PANEL
ANION GAP: 1 — AB (ref 7–16)
BUN: 12 mg/dL (ref 7–18)
CALCIUM: 9 mg/dL (ref 8.5–10.1)
CO2: 38 mmol/L — AB (ref 21–32)
CREATININE: 0.64 mg/dL (ref 0.60–1.30)
Chloride: 89 mmol/L — ABNORMAL LOW (ref 98–107)
EGFR (African American): >60
EGFR (Non-African Amer.): >60
GLUCOSE: 95 mg/dL (ref 65–99)
Osmolality: 257 (ref 275–301)
Potassium: 4.1 mmol/L (ref 3.5–5.1)
SODIUM: 128 mmol/L — AB (ref 136–145)

## 2013-11-09 LAB — BASIC METABOLIC PANEL
Anion Gap: 1 — ABNORMAL LOW (ref 7–16)
BUN: 11 mg/dL (ref 7–18)
CALCIUM: 9 mg/dL (ref 8.5–10.1)
CO2: 36 mmol/L — AB (ref 21–32)
CREATININE: 0.63 mg/dL (ref 0.60–1.30)
Chloride: 89 mmol/L — ABNORMAL LOW (ref 98–107)
GLUCOSE: 94 mg/dL (ref 65–99)
Osmolality: 253 (ref 275–301)
POTASSIUM: 4.3 mmol/L (ref 3.5–5.1)
Sodium: 126 mmol/L — ABNORMAL LOW (ref 136–145)

## 2013-11-09 LAB — CBC WITH DIFFERENTIAL/PLATELET
Basophil #: 0.1 10*3/uL (ref 0.0–0.1)
Basophil %: 0.9 %
Eosinophil #: 0.4 10*3/uL (ref 0.0–0.7)
Eosinophil %: 4.8 %
HCT: 31.5 % — AB (ref 35.0–47.0)
HGB: 10.1 g/dL — ABNORMAL LOW (ref 12.0–16.0)
LYMPHS PCT: 17 %
Lymphocyte #: 1.3 10*3/uL (ref 1.0–3.6)
MCH: 29.1 pg (ref 26.0–34.0)
MCHC: 32.2 g/dL (ref 32.0–36.0)
MCV: 91 fL (ref 80–100)
Monocyte #: 0.7 x10 3/mm (ref 0.2–0.9)
Monocyte %: 9.7 %
NEUTROS ABS: 5 10*3/uL (ref 1.4–6.5)
NEUTROS PCT: 67.6 %
Platelet: 356 10*3/uL (ref 150–440)
RBC: 3.48 10*6/uL — AB (ref 3.80–5.20)
RDW: 16.5 % — ABNORMAL HIGH (ref 11.5–14.5)
WBC: 7.4 10*3/uL (ref 3.6–11.0)

## 2013-11-10 LAB — BASIC METABOLIC PANEL
ANION GAP: 2 — AB (ref 7–16)
BUN: 11 mg/dL (ref 7–18)
CO2: 37 mmol/L — AB (ref 21–32)
Calcium, Total: 9.1 mg/dL (ref 8.5–10.1)
Chloride: 86 mmol/L — ABNORMAL LOW (ref 98–107)
Creatinine: 0.59 mg/dL — ABNORMAL LOW (ref 0.60–1.30)
EGFR (African American): >60
EGFR (Non-African Amer.): >60
GLUCOSE: 111 mg/dL — AB (ref 65–99)
Osmolality: 252 (ref 275–301)
POTASSIUM: 4.6 mmol/L (ref 3.5–5.1)
SODIUM: 125 mmol/L — AB (ref 136–145)

## 2013-11-10 LAB — STOOL CULTURE

## 2013-11-10 LAB — HEMOGLOBIN: HGB: 9.7 g/dL — ABNORMAL LOW (ref 12.0–16.0)

## 2013-11-11 LAB — BASIC METABOLIC PANEL
Anion Gap: 2 — ABNORMAL LOW (ref 7–16)
BUN: 11 mg/dL (ref 7–18)
CHLORIDE: 85 mmol/L — AB (ref 98–107)
CREATININE: 0.49 mg/dL — AB (ref 0.60–1.30)
Calcium, Total: 9.3 mg/dL (ref 8.5–10.1)
Co2: 39 mmol/L — ABNORMAL HIGH (ref 21–32)
EGFR (Non-African Amer.): >60
Glucose: 99 mg/dL (ref 65–99)
Osmolality: 253 (ref 275–301)
Potassium: 4 mmol/L (ref 3.5–5.1)
SODIUM: 126 mmol/L — AB (ref 136–145)

## 2013-11-11 LAB — HEMOGLOBIN: HGB: 10.3 g/dL — AB (ref 12.0–16.0)

## 2013-11-12 LAB — BASIC METABOLIC PANEL
Anion Gap: 3 — ABNORMAL LOW (ref 7–16)
BUN: 20 mg/dL — ABNORMAL HIGH (ref 7–18)
CHLORIDE: 81 mmol/L — AB (ref 98–107)
Calcium, Total: 9.2 mg/dL (ref 8.5–10.1)
Co2: 43 mmol/L (ref 21–32)
Creatinine: 0.84 mg/dL (ref 0.60–1.30)
GLUCOSE: 108 mg/dL — AB (ref 65–99)
Osmolality: 258 (ref 275–301)
POTASSIUM: 3.6 mmol/L (ref 3.5–5.1)
Sodium: 127 mmol/L — ABNORMAL LOW (ref 136–145)

## 2013-11-12 LAB — HEMOGLOBIN: HGB: 11.2 g/dL — ABNORMAL LOW (ref 12.0–16.0)

## 2013-11-13 LAB — BASIC METABOLIC PANEL
Anion Gap: 2 — ABNORMAL LOW (ref 7–16)
BUN: 31 mg/dL — ABNORMAL HIGH (ref 7–18)
CO2: 42 mmol/L — AB (ref 21–32)
CREATININE: 1.03 mg/dL (ref 0.60–1.30)
Calcium, Total: 8.9 mg/dL (ref 8.5–10.1)
Chloride: 83 mmol/L — ABNORMAL LOW (ref 98–107)
EGFR (African American): 58 — ABNORMAL LOW
GFR CALC NON AF AMER: 50 — AB
Glucose: 105 mg/dL — ABNORMAL HIGH (ref 65–99)
Osmolality: 262 (ref 275–301)
Potassium: 3.8 mmol/L (ref 3.5–5.1)
Sodium: 127 mmol/L — ABNORMAL LOW (ref 136–145)

## 2013-11-13 LAB — HEMOGLOBIN: HGB: 11.8 g/dL — ABNORMAL LOW (ref 12.0–16.0)

## 2013-11-14 LAB — BASIC METABOLIC PANEL
ANION GAP: 1 — AB (ref 7–16)
BUN: 58 mg/dL — AB (ref 7–18)
CHLORIDE: 84 mmol/L — AB (ref 98–107)
CO2: 43 mmol/L — AB (ref 21–32)
CREATININE: 1.42 mg/dL — AB (ref 0.60–1.30)
Calcium, Total: 8.8 mg/dL (ref 8.5–10.1)
EGFR (African American): 39 — ABNORMAL LOW
EGFR (Non-African Amer.): 34 — ABNORMAL LOW
GLUCOSE: 148 mg/dL — AB (ref 65–99)
Osmolality: 276 (ref 275–301)
POTASSIUM: 3.8 mmol/L (ref 3.5–5.1)
Sodium: 128 mmol/L — ABNORMAL LOW (ref 136–145)

## 2013-11-15 LAB — BASIC METABOLIC PANEL
Anion Gap: 2 — ABNORMAL LOW (ref 7–16)
BUN: 56 mg/dL — ABNORMAL HIGH (ref 7–18)
CHLORIDE: 84 mmol/L — AB (ref 98–107)
Calcium, Total: 8.9 mg/dL (ref 8.5–10.1)
Co2: 39 mmol/L — ABNORMAL HIGH (ref 21–32)
Creatinine: 0.96 mg/dL (ref 0.60–1.30)
EGFR (African American): >60
GFR CALC NON AF AMER: 55 — AB
Glucose: 126 mg/dL — ABNORMAL HIGH (ref 65–99)
OSMOLALITY: 269 (ref 275–301)
Potassium: 3.5 mmol/L (ref 3.5–5.1)
Sodium: 125 mmol/L — ABNORMAL LOW (ref 136–145)

## 2013-11-16 LAB — BASIC METABOLIC PANEL
Anion Gap: 1 — ABNORMAL LOW (ref 7–16)
BUN: 29 mg/dL — ABNORMAL HIGH (ref 7–18)
CALCIUM: 8.7 mg/dL (ref 8.5–10.1)
CREATININE: 0.75 mg/dL (ref 0.60–1.30)
Chloride: 87 mmol/L — ABNORMAL LOW (ref 98–107)
Co2: 41 mmol/L (ref 21–32)
EGFR (African American): >60
GLUCOSE: 107 mg/dL — AB (ref 65–99)
Osmolality: 265 (ref 275–301)
Potassium: 3.8 mmol/L (ref 3.5–5.1)
SODIUM: 129 mmol/L — AB (ref 136–145)

## 2013-11-17 ENCOUNTER — Ambulatory Visit: Payer: Self-pay | Admitting: Hematology and Oncology

## 2013-11-17 ENCOUNTER — Telehealth: Payer: Self-pay

## 2013-11-17 NOTE — Telephone Encounter (Signed)
Attempt[facility] to contact pt [facility] 11[facility]16/[facility].  Left detailed message on pt's voicemail to call the office w/ any questions about discharge instructions and/or medications. Reminded pt of appt w/ Dr. Mariah MillingGollan 11/24/13 @ 3:45.

## 2013-11-22 ENCOUNTER — Ambulatory Visit: Payer: Self-pay | Admitting: Hematology and Oncology

## 2013-11-24 ENCOUNTER — Encounter: Payer: Medicare Other | Admitting: Cardiovascular Disease

## 2013-12-04 ENCOUNTER — Ambulatory Visit (INDEPENDENT_AMBULATORY_CARE_PROVIDER_SITE_OTHER): Payer: Medicare Other | Admitting: Cardiovascular Disease

## 2013-12-04 ENCOUNTER — Encounter: Payer: Self-pay | Admitting: Cardiovascular Disease

## 2013-12-04 VITALS — BP 90/62 | HR 66 | Ht 61.0 in | Wt 145.3 lb

## 2013-12-04 DIAGNOSIS — R5381 Other malaise: Secondary | ICD-10-CM

## 2013-12-04 DIAGNOSIS — I5032 Chronic diastolic (congestive) heart failure: Secondary | ICD-10-CM

## 2013-12-04 DIAGNOSIS — J449 Chronic obstructive pulmonary disease, unspecified: Secondary | ICD-10-CM

## 2013-12-04 DIAGNOSIS — E871 Hypo-osmolality and hyponatremia: Secondary | ICD-10-CM | POA: Insufficient documentation

## 2013-12-04 DIAGNOSIS — R5383 Other fatigue: Secondary | ICD-10-CM

## 2013-12-04 DIAGNOSIS — D649 Anemia, unspecified: Secondary | ICD-10-CM | POA: Insufficient documentation

## 2013-12-04 DIAGNOSIS — I509 Heart failure, unspecified: Secondary | ICD-10-CM

## 2013-12-04 DIAGNOSIS — I10 Essential (primary) hypertension: Secondary | ICD-10-CM

## 2013-12-04 NOTE — Patient Instructions (Signed)
You are doing well.  Blood pressure is low Hold the cardura/doxazosin as blood pressure is low.  Please take bumex for weight daily for weight >148 Do very low dose potassium when you do not take bumex  Please call us if you have new issues that need to be addressed before your next appt.  Your physician wants you to follow-up in: 2 month.

## 2013-12-04 NOTE — Assessment & Plan Note (Signed)
Suggested she followup with hematology. Will likely need periodic iron infusions, periodic packed red blood cell transfusions if she will allow. Anemia is likely contributing to shortness of breath and heart failure symptoms

## 2013-12-04 NOTE — Assessment & Plan Note (Signed)
Chronic diastolic CHF with pulmonary hypertension. Weight is down to 145 pounds which is a good baseline weight for her. We have suggested she restart Bumex daily for any weight more than 148 pounds

## 2013-12-04 NOTE — Assessment & Plan Note (Signed)
Blood pressures running low likely from significant recent diuresis. She'll continue on low-dose bystolic 5-10 mg as tolerated. She will hold the Cardura 4 mg for low pressures. Hold hydralazine for systolic pressure less than 150

## 2013-12-04 NOTE — Progress Notes (Signed)
Patient ID: Catherine Holmes, female    DOB: 1928-01-31, 80 y.o.   MRN: [id number]  HPI Comments: 80 year old woman with a history of DM,  peripheral vascular disease, 60 to 70% carotid arterial disease, moderate arterial disease of the lower extremities, status post bilateral iliac stenting, moderate renal stenosis, hyperlipidemia, history of severe  hypertension with labile blood pressures, history of episodic shortness of breath and chest squeezing relieved with albuterol/nebs who presents for routine followup. She has a sulfa allergy to loop diuretics. History of falls.   left carotid arterial stenosis estimated at 60-79% disease.  Previous weight October 2014 was 148 pounds.  weight at the end of December 2014 he was 160 pounds, she had increasing shortness of breath, had not been taking Bumex, had tightness around her chest like her bra was very tight.  she start Bumex daily. her weight decreased  5 pounds or more, she felt better She went to the beach and on her return, weight was high, she was found to be anemic by hematology with hemoglobin in the low 8 range.. She presented to the hospital with shortness of breath. She had aggressive diuresis and treatment of low sodium with tolvaptam. Notes indicate she had 5 L diuresis Echocardiogram in the hospital showed normal ejection fraction, moderate to severe, hypertension, moderately dilated left atrium  Has a history of large hiatal hernia She was admitted on 11/02/2013 , discharged on 11/16/2013  In followup today, weight is now 145 pounds. She feels well. Somewhat tired. She did do we have at [facility] and was not happy at this facility. Many of her medications were changed. She was started on amlodipine and metoprolol. She has not been taking these in followup   in the hospital at [facility] in 2012 for general malaise, severe hypertension, shortness of breath, chest pain. She had a stress test, lexiscan that showed no significant ischemia.  Significant shortness of breath with lexiscan.   hospital admission on 01/08/2012 for shortness of breath and syncope. She was found to have significant anemia. Treated with Lasix for diastolic CHF.  Underlying severe left carotid arterial disease estimated at 70%.   Prior followup with Dr. Welton FlakesKhan in pulmonary with numerous CT scans, MRIs, PFTs, sleep study. She was told that she had hypoxemia overnight and was started on nighttime oxygen.   Recent hospitalization 05/27/2013 for acute on chronic diastolic CHF. She was treated with IV Bumex twice a day with improvement of her symptoms. Getting factor was her anemia with hematocrit of 25.  Previous iron infusion. In the second week of September 2014 she fell outside a cracker barrel and hit her head. She has been slow to recover. She still has a knot on her head.  She is not able to tolerate statins. She does handle zetia without significant side effects. previously encouraged to take red yeast rice.   Echocardiogram in the hospital August 2014 shows normal ejection fraction, severely elevated right ventricular systolic pressures, mild to moderate TR  EKG shows normal sinus rhythm with rate 67 beats a minute, no significant ST or T wave changes  Outpatient Encounter Prescriptions as of 12/04/2013  Medication Sig  . aspirin 81 MG tablet Take 81 mg by mouth daily.    . beta carotene w/minerals (OCUVITE) tablet Take 1 tablet by mouth 2 (two) times daily.  . Cholecalciferol (VITAMIN D-3 PO) Take 1,000 Units by mouth.  . citalopram (CELEXA) 20 MG tablet Take 20 mg by mouth daily.  Marland Kitchen. dexlansoprazole (DEXILANT)  60 MG capsule Take 60 mg by mouth daily as needed.  . doxazosin (CARDURA) 4 MG tablet Take 4 mg by mouth at bedtime.   Marland Kitchen ezetimibe (ZETIA) 10 MG tablet Take 1 tablet (10 mg total) by mouth daily.  Marland Kitchen gabapentin (NEURONTIN) 100 MG capsule Take 100 mg by mouth 2 (two) times daily.   . hydrALAZINE (APRESOLINE) 50 MG tablet Take 50 mg by mouth 2  (two) times daily. Take 1/2 tablet twice a day prn  . metFORMIN (GLUCOPHAGE) 500 MG tablet Take 500 mg by mouth daily with breakfast.   . nebivolol (BYSTOLIC) 5 MG tablet Take 5 mg by mouth daily.  . nitroGLYCERIN (NITROSTAT) 0.4 MG SL tablet Place 1 tablet (0.4 mg total) under the tongue every 5 (five) minutes as needed for chest pain.  . potassium chloride (K-DUR,KLOR-CON) 10 MEQ tablet Take 0.5 tablets (5 mEq total) by mouth 2 (two) times daily.  Marland Kitchen spironolactone (ALDACTONE) 25 MG tablet Take 25 mg by mouth daily.  Marland Kitchen tiotropium (SPIRIVA) 18 MCG inhalation capsule Place 1 capsule (18 mcg total) into inhaler and inhale daily.  Marland Kitchen albuterol (PROAIR HFA) 108 (90 BASE) MCG/ACT inhaler Inhale 2 puffs into the lungs every 6 (six) hours as needed.  Marland Kitchen albuterol (PROVENTIL) (2.5 MG/3ML) 0.083% nebulizer solution Take 2.5 mg by nebulization every 6 (six) hours as needed.    Marland Kitchen arformoterol (BROVANA) 15 MCG/2ML NEBU Take 2 mLs (15 mcg total) by nebulization 2 (two) times daily.  . bumetanide (BUMEX) 1 MG tablet Take 2 tabs BID  . diphenhydramine-acetaminophen (TYLENOL PM) 25-500 MG TABS Take 1 tablet by mouth at bedtime as needed.  Marland Kitchen HYDROcodone-acetaminophen (NORCO/VICODIN) 5-325 MG per tablet Take 1 tablet by mouth as needed.   . mometasone-formoterol (DULERA) 100-5 MCG/ACT AERO Inhale 2 puffs into the lungs.  . Multiple Vitamin (MULTIVITAMIN) tablet Take 1 tablet by mouth daily.     Review of Systems  Constitutional: Negative.   HENT: Negative.   Eyes: Negative.   Respiratory: Negative.   Cardiovascular: Negative.   Gastrointestinal: Negative.   Endocrine: Negative.   Musculoskeletal: Positive for arthralgias, gait problem and myalgias.  Skin: Negative.   Allergic/Immunologic: Negative.   Neurological: Negative.   Hematological: Negative.   Psychiatric/Behavioral: Negative.   All other systems reviewed and are negative.    BP 90/62  Pulse 66  Ht 5\' 1"  (1.549 m)  Wt 145 lb 5 oz (65.913  kg)  BMI 27.47 kg/m2 Blood pressure improved to 110/70 Physical Exam  Nursing note and vitals reviewed. Constitutional: She is oriented to person, place, and time. She appears well-developed and well-nourished.  HENT:  Head: Normocephalic.  Nose: Nose normal.  Mouth/Throat: Oropharynx is clear and moist.  Eyes: Conjunctivae are normal. Pupils are equal, round, and reactive to light.  Neck: Normal range of motion. Neck supple. No JVD present. Carotid bruit is present.  Cardiovascular: Normal rate, regular rhythm, S1 normal, S2 normal and intact distal pulses.  Exam reveals no gallop and no friction rub.   Murmur heard.  Crescendo systolic murmur is present with a grade of 2/6  Pulmonary/Chest: Effort normal. No respiratory distress. She has decreased breath sounds. She has no wheezes. She has no rales. She exhibits no tenderness.  Abdominal: Soft. Bowel sounds are normal. She exhibits no distension. There is no tenderness.  Musculoskeletal: Normal range of motion. She exhibits no edema and no tenderness.  Lymphadenopathy:    She has no cervical adenopathy.  Neurological: She is alert and oriented to  person, place, and time. Coordination normal.  Skin: Skin is warm and dry. No rash noted. No erythema.  Psychiatric: She has a normal mood and affect. Her behavior is normal. Judgment and thought content normal.    Assessment and Plan

## 2013-12-04 NOTE — Assessment & Plan Note (Signed)
She reports that she has followup lab work and primary care office visit next week.

## 2013-12-04 NOTE — Assessment & Plan Note (Signed)
Breathing is stable. Not on oxygen

## 2013-12-10 ENCOUNTER — Ambulatory Visit: Payer: Self-pay | Admitting: Hematology and Oncology

## 2013-12-17 IMAGING — CR DG CHEST 1V PORT
1 series · 1 of 1 positions shown · non-contrast
Comparison: none

REASON FOR EXAM: Shortness of Breath
COMMENTS:

[ap]
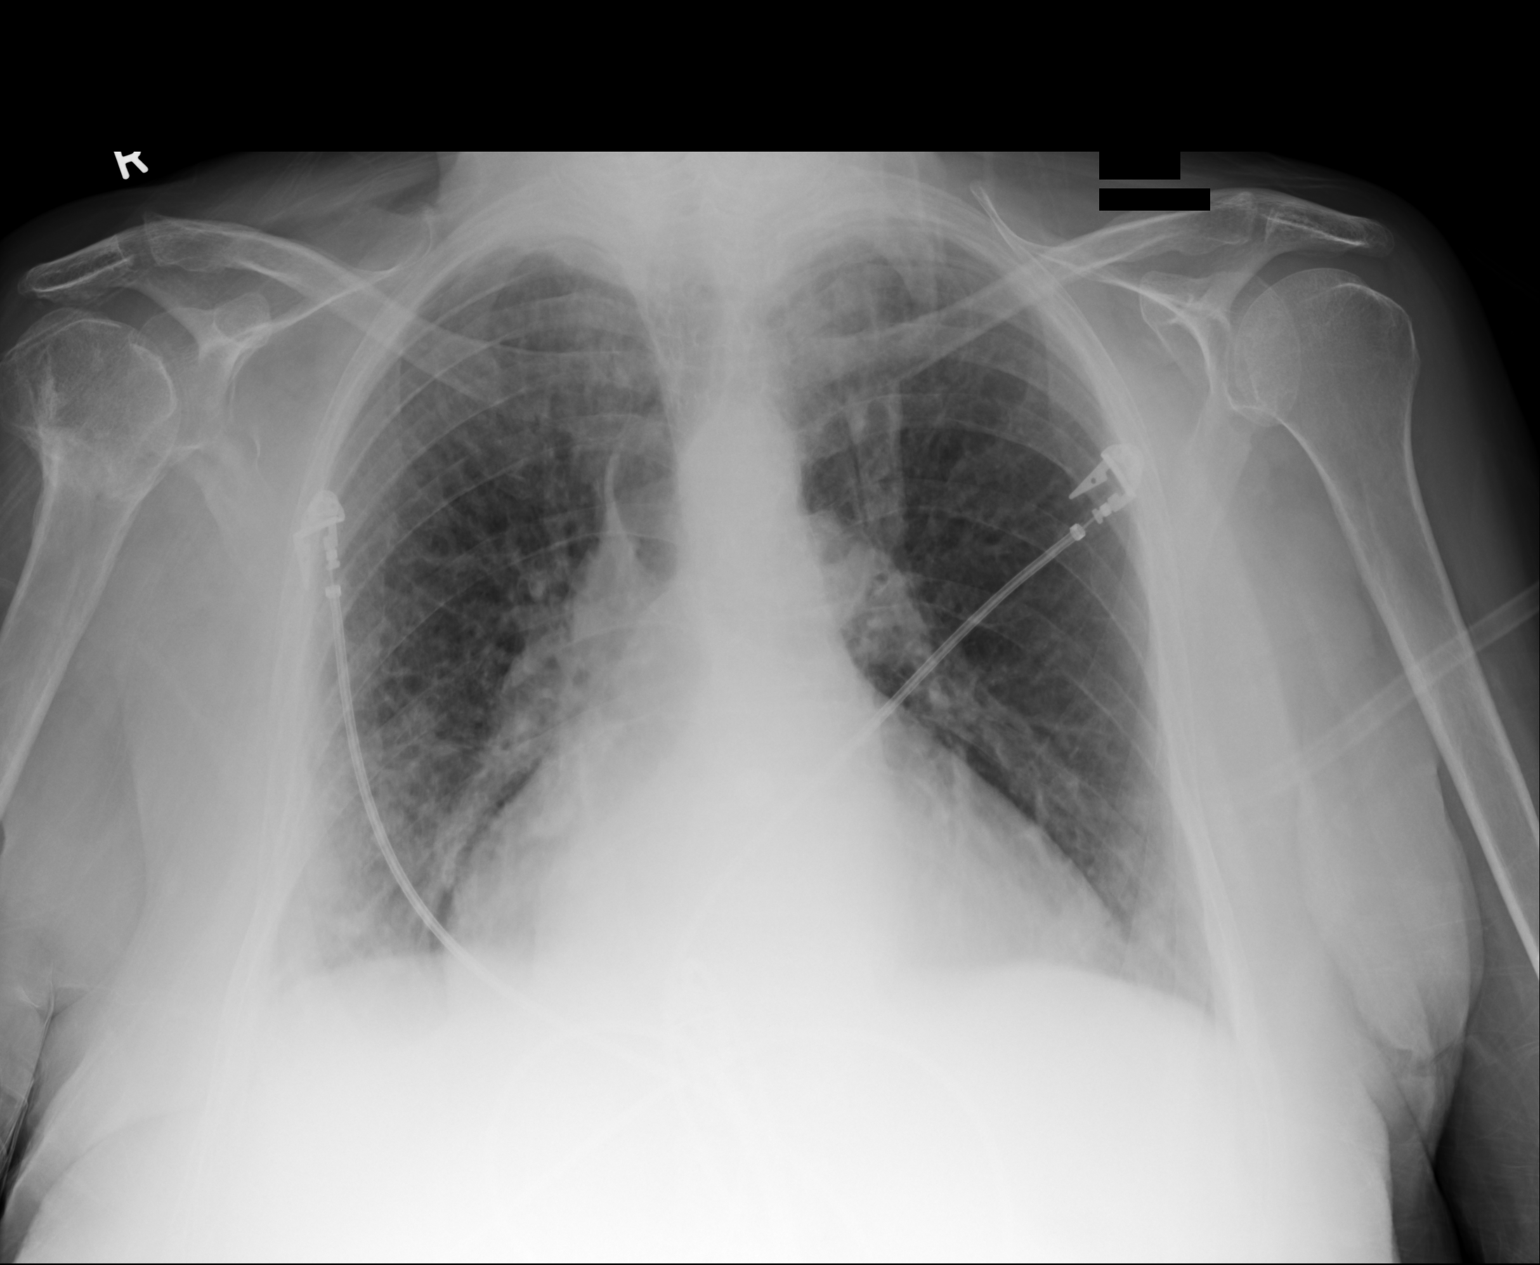

[1 of 1 positions shown; findings below may reference images not displayed]

PROCEDURE:     DXR - DXR PORTABLE CHEST SINGLE VIEW  - June 09, 2013  [DATE]

RESULT:     Comparison is made to the study of 05/26/2013.

The cardiac silhouette is borderline enlarged but unchanged. There is
diffusely prominent ill-defined density at the right lung base consistent
with atelectasis or evolving pneumonia. There is mild diffuse interstitial
thickening. There is no effusion or pneumothorax.
IMPRESSION: 1. Cardiomegaly.
2. Interstitial thickening possibly edematous.
3. Right lung base atelectasis versus pneumonia. Followup PA and lateral
views are recommended.

[REDACTED]

## 2013-12-19 IMAGING — CR DG CHEST 2V
1 series · 3 of 3 positions shown · non-contrast
Comparison: none

REASON FOR EXAM: resp failure
COMMENTS:

[Series 1: x chest ap · 0.14mm/px · 3 of 3 slices shown]
[im 1/3]
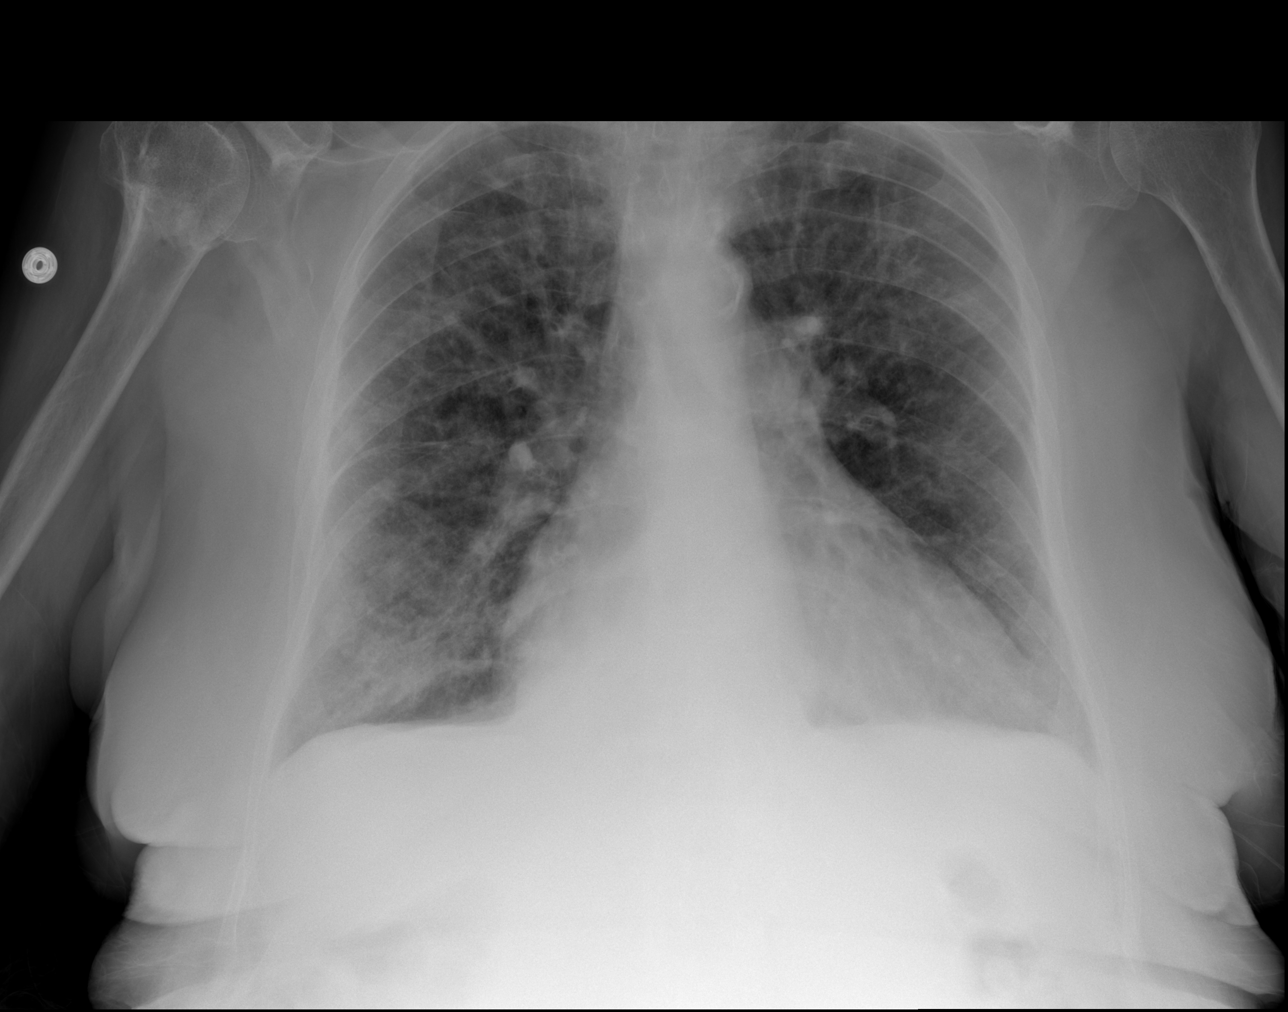
[im 2/3]
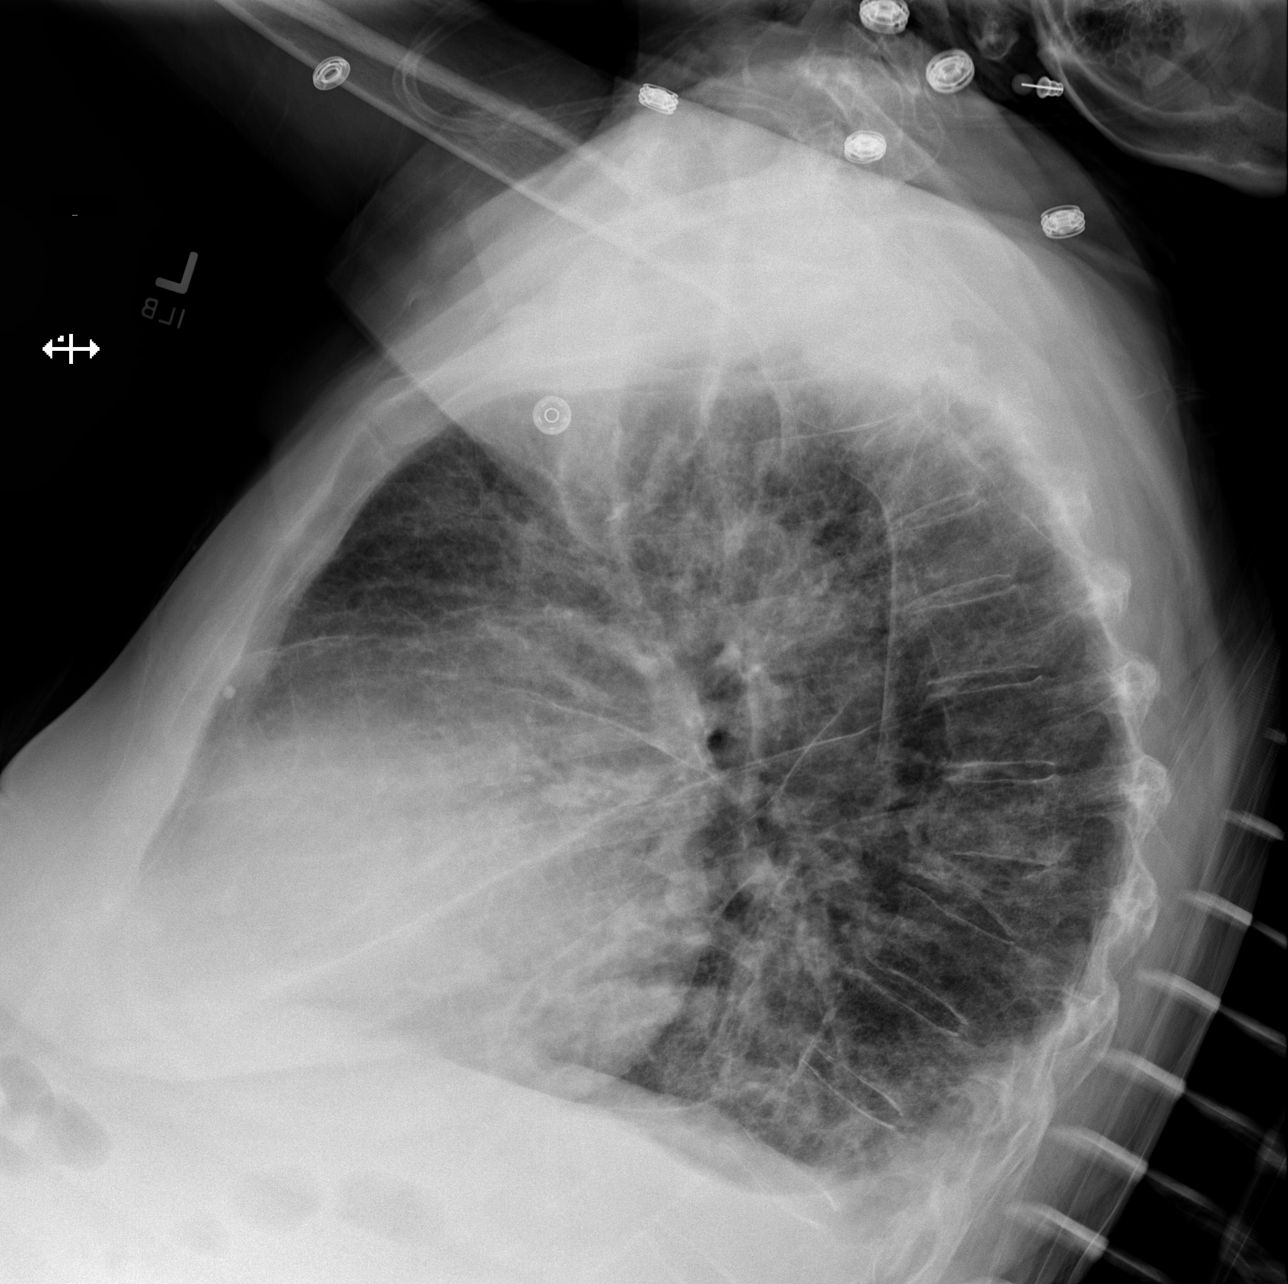
[im 3/3]
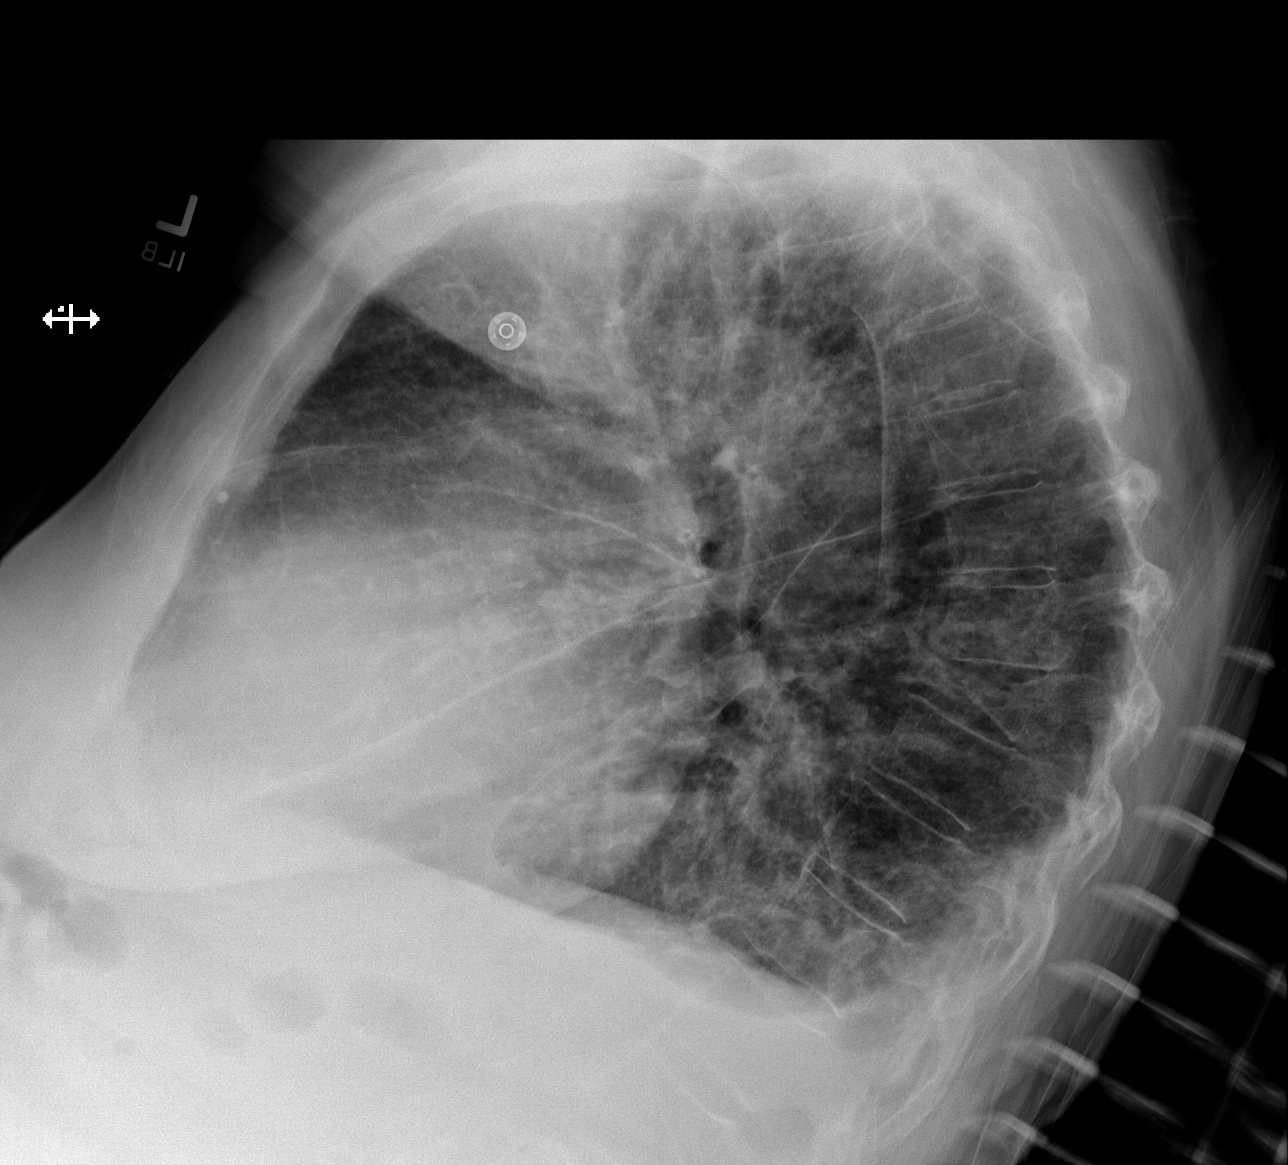

[3 of 3 positions shown; findings below may reference images not displayed]

PROCEDURE:     DXR - DXR CHEST PA (OR AP) AND LATERAL  - June 11, 2013  [DATE]

RESULT:     Comparison is made to the study of 06/09/2013.

There is diffuse interstitial and alveolar density concerning for edema. The
heart is enlarged. There is a band of density at the right lung base
consistent with atelectasis. Trace pleural effusions are present. There is
thickening of the major and minor fissures on the right side. There is no
pneumothorax or consolidation. Atherosclerotic calcification is seen within
the aorta. There is stable sclerosis and deformity of the proximal right
femur.
IMPRESSION: 1. Interstitial and alveolar density with cardiomegaly. Correlate for
congestive heart failure. Linear areas of atelectasis versus fibrosis as
described. Atherosclerotic disease present.

[REDACTED]

## 2013-12-20 ENCOUNTER — Ambulatory Visit: Payer: Self-pay | Admitting: Hematology and Oncology

## 2013-12-29 ENCOUNTER — Other Ambulatory Visit: Payer: Self-pay | Admitting: Cardiovascular Disease

## 2014-01-23 LAB — COMPREHENSIVE METABOLIC PANEL
ANION GAP: 6 — AB (ref 7–16)
Albumin: 3.3 g/dL — ABNORMAL LOW (ref 3.4–5.0)
Alkaline Phosphatase: 97 U/L
BUN: 18 mg/dL (ref 7–18)
Bilirubin,Total: 0.3 mg/dL (ref 0.2–1.0)
CALCIUM: 8.5 mg/dL (ref 8.5–10.1)
CO2: 32 mmol/L (ref 21–32)
Chloride: 98 mmol/L (ref 98–107)
Creatinine: 0.81 mg/dL (ref 0.60–1.30)
EGFR (African American): >60
Glucose: 126 mg/dL — ABNORMAL HIGH (ref 65–99)
Osmolality: 275 (ref 275–301)
Potassium: 3.8 mmol/L (ref 3.5–5.1)
SGOT(AST): 19 U/L (ref 15–37)
SGPT (ALT): 38 U/L (ref 12–78)
SODIUM: 136 mmol/L (ref 136–145)
TOTAL PROTEIN: 6.4 g/dL (ref 6.4–8.2)

## 2014-01-23 LAB — CBC
HCT: 33.6 % — AB (ref 35.0–47.0)
HGB: 11 g/dL — ABNORMAL LOW (ref 12.0–16.0)
MCH: 30.1 pg (ref 26.0–34.0)
MCHC: 32.8 g/dL (ref 32.0–36.0)
MCV: 92 fL (ref 80–100)
Platelet: 264 10*3/uL (ref 150–440)
RBC: 3.65 10*6/uL — ABNORMAL LOW (ref 3.80–5.20)
RDW: 16.4 % — ABNORMAL HIGH (ref 11.5–14.5)
WBC: 7 10*3/uL (ref 3.6–11.0)

## 2014-01-23 LAB — TROPONIN I: Troponin-I: <0.02 ng/mL

## 2014-01-24 ENCOUNTER — Inpatient Hospital Stay: Payer: Self-pay | Admitting: Family Medicine

## 2014-01-24 DIAGNOSIS — I5033 Acute on chronic diastolic (congestive) heart failure: Secondary | ICD-10-CM

## 2014-01-24 LAB — TROPONIN I
Troponin-I: <0.02 ng/mL
Troponin-I: <0.02 ng/mL

## 2014-01-24 LAB — PRO B NATRIURETIC PEPTIDE: B-TYPE NATIURETIC PEPTID: 3652 pg/mL — AB (ref 0–450)

## 2014-01-24 LAB — URINALYSIS, COMPLETE
Bilirubin,UR: NEGATIVE
Blood: NEGATIVE
Glucose,UR: NEGATIVE mg/dL (ref 0–75)
Ketone: NEGATIVE
NITRITE: NEGATIVE
Ph: 8 (ref 4.5–8.0)
Protein: NEGATIVE
RBC,UR: <1 /HPF (ref 0–5)
Specific Gravity: 1.01 (ref 1.003–1.030)
Squamous Epithelial: <1

## 2014-01-24 LAB — CK TOTAL AND CKMB (NOT AT ARMC)
CK, Total: 52 U/L
CK, Total: 55 U/L
CK, Total: 50 U/L
CK-MB: 1.3 ng/mL (ref 0.5–3.6)
CK-MB: 1.8 ng/mL (ref 0.5–3.6)
CK-MB: 1.9 ng/mL (ref 0.5–3.6)

## 2014-01-25 LAB — BASIC METABOLIC PANEL
ANION GAP: 4 — AB (ref 7–16)
BUN: 14 mg/dL (ref 7–18)
CREATININE: 0.74 mg/dL (ref 0.60–1.30)
Calcium, Total: 8.4 mg/dL — ABNORMAL LOW (ref 8.5–10.1)
Chloride: 93 mmol/L — ABNORMAL LOW (ref 98–107)
Co2: 33 mmol/L — ABNORMAL HIGH (ref 21–32)
EGFR (African American): >60
GLUCOSE: 168 mg/dL — AB (ref 65–99)
OSMOLALITY: 265 (ref 275–301)
Potassium: 4.4 mmol/L (ref 3.5–5.1)
SODIUM: 130 mmol/L — AB (ref 136–145)

## 2014-01-25 LAB — CBC WITH DIFFERENTIAL/PLATELET
BASOS ABS: 0 10*3/uL (ref 0.0–0.1)
BASOS PCT: 0.2 %
EOS ABS: 0 10*3/uL (ref 0.0–0.7)
Eosinophil %: 0 %
HCT: 35 % (ref 35.0–47.0)
HGB: 11.4 g/dL — AB (ref 12.0–16.0)
Lymphocyte #: 0.5 10*3/uL — ABNORMAL LOW (ref 1.0–3.6)
Lymphocyte %: 5.2 %
MCH: 29.8 pg (ref 26.0–34.0)
MCHC: 32.5 g/dL (ref 32.0–36.0)
MCV: 92 fL (ref 80–100)
MONOS PCT: 2.5 %
Monocyte #: 0.2 x10 3/mm (ref 0.2–0.9)
NEUTROS PCT: 92.1 %
Neutrophil #: 9 10*3/uL — ABNORMAL HIGH (ref 1.4–6.5)
Platelet: 245 10*3/uL (ref 150–440)
RBC: 3.82 10*6/uL (ref 3.80–5.20)
RDW: 16.1 % — ABNORMAL HIGH (ref 11.5–14.5)
WBC: 9.8 10*3/uL (ref 3.6–11.0)

## 2014-01-26 DIAGNOSIS — I5033 Acute on chronic diastolic (congestive) heart failure: Secondary | ICD-10-CM

## 2014-01-26 DIAGNOSIS — J9620 Acute and chronic respiratory failure, unspecified whether with hypoxia or hypercapnia: Secondary | ICD-10-CM

## 2014-01-26 LAB — BASIC METABOLIC PANEL
ANION GAP: 3 — AB (ref 7–16)
BUN: 20 mg/dL — AB (ref 7–18)
CALCIUM: 8.2 mg/dL — AB (ref 8.5–10.1)
CO2: 37 mmol/L — AB (ref 21–32)
Chloride: 91 mmol/L — ABNORMAL LOW (ref 98–107)
Creatinine: 0.66 mg/dL (ref 0.60–1.30)
EGFR (African American): >60
GLUCOSE: 113 mg/dL — AB (ref 65–99)
OSMOLALITY: 266 (ref 275–301)
Potassium: 4 mmol/L (ref 3.5–5.1)
SODIUM: 131 mmol/L — AB (ref 136–145)

## 2014-01-27 LAB — CBC WITH DIFFERENTIAL/PLATELET
Basophil #: 0 10*3/uL (ref 0.0–0.1)
Basophil %: 0.4 %
EOS PCT: 0.2 %
Eosinophil #: 0 10*3/uL (ref 0.0–0.7)
HCT: 34.1 % — AB (ref 35.0–47.0)
HGB: 11.5 g/dL — AB (ref 12.0–16.0)
LYMPHS ABS: 1.6 10*3/uL (ref 1.0–3.6)
Lymphocyte %: 18.7 %
MCH: 30.5 pg (ref 26.0–34.0)
MCHC: 33.9 g/dL (ref 32.0–36.0)
MCV: 90 fL (ref 80–100)
Monocyte #: 1 x10 3/mm — ABNORMAL HIGH (ref 0.2–0.9)
Monocyte %: 11.4 %
NEUTROS ABS: 5.9 10*3/uL (ref 1.4–6.5)
Neutrophil %: 69.3 %
Platelet: 258 10*3/uL (ref 150–440)
RBC: 3.77 10*6/uL — ABNORMAL LOW (ref 3.80–5.20)
RDW: 15.7 % — ABNORMAL HIGH (ref 11.5–14.5)
WBC: 8.5 10*3/uL (ref 3.6–11.0)

## 2014-01-27 LAB — BASIC METABOLIC PANEL
Anion Gap: 4 — ABNORMAL LOW (ref 7–16)
BUN: 21 mg/dL — AB (ref 7–18)
Calcium, Total: 8.9 mg/dL (ref 8.5–10.1)
Chloride: 87 mmol/L — ABNORMAL LOW (ref 98–107)
Co2: 39 mmol/L — ABNORMAL HIGH (ref 21–32)
Creatinine: 0.8 mg/dL (ref 0.60–1.30)
EGFR (African American): >60
EGFR (Non-African Amer.): >60
Glucose: 107 mg/dL — ABNORMAL HIGH (ref 65–99)
OSMOLALITY: 264 (ref 275–301)
POTASSIUM: 3.2 mmol/L — AB (ref 3.5–5.1)
SODIUM: 130 mmol/L — AB (ref 136–145)

## 2014-01-28 ENCOUNTER — Telehealth: Payer: Self-pay

## 2014-01-28 NOTE — Telephone Encounter (Signed)
Attempted to contact pt regarding discharge from [facility] on 01/27/14. Left message for pt to call back.

## 2014-01-28 NOTE — Telephone Encounter (Signed)
Patient contacted regarding discharge from [facility] on 01/27/14.  Patient understands to follow up with Dr. Mariah MillingGollan on 02/02/14 at 2:15pm at [facility]. Patient understands discharge instructions? yes Patient understands medications and regiment? yes Patient understands to bring all medications to this visit? yes

## 2014-01-29 ENCOUNTER — Other Ambulatory Visit: Payer: Self-pay | Admitting: Cardiovascular Disease

## 2014-01-29 ENCOUNTER — Telehealth: Payer: Self-pay

## 2014-01-29 MED ORDER — HYDRALAZINE HCL 50 MG PO TABS: 50.0000 mg | ORAL_TABLET | Freq: Two times a day (BID) | ORAL | Status: DC

## 2014-01-29 NOTE — Telephone Encounter (Signed)
Would take extra Bumex, not spironolactone for weight gain Can we call to monitor weight after the weekend

## 2014-01-29 NOTE — Telephone Encounter (Signed)
Spoke w/ Eunice Blaseebbie, pt's daughter.  She reports that pt's wt is up approx 3-4 lbs since her d/c [facility] on Wed. She is on prednisone, which has increased her appetite, she has not had a BM since Wed. She would like to know if she should take an extra spironolactone or continue to monitor symptoms. Denies swelling or SOB. Pt feels well in general, but the prednisone makes her sleepy. Please advise.  Thank you.

## 2014-02-01 ENCOUNTER — Encounter: Payer: Medicare Other | Admitting: Physician Assistant

## 2014-02-01 NOTE — Telephone Encounter (Signed)
Spoke w/ pt.  She reports that her wt today is 155lbs.  Advised her of Dr. Windell HummingbirdGollan's recommendation.  She is agreeable to this and will keep her appt tomorrow w/ Dr. Mariah MillingGollan.

## 2014-02-02 ENCOUNTER — Ambulatory Visit (INDEPENDENT_AMBULATORY_CARE_PROVIDER_SITE_OTHER): Payer: Medicare Other | Admitting: Cardiovascular Disease

## 2014-02-02 ENCOUNTER — Encounter: Payer: Self-pay | Admitting: Cardiovascular Disease

## 2014-02-02 VITALS — BP 110/60 | HR 63 | Ht 61.0 in | Wt 161.8 lb

## 2014-02-02 DIAGNOSIS — J449 Chronic obstructive pulmonary disease, unspecified: Secondary | ICD-10-CM

## 2014-02-02 DIAGNOSIS — I5032 Chronic diastolic (congestive) heart failure: Secondary | ICD-10-CM

## 2014-02-02 DIAGNOSIS — I10 Essential (primary) hypertension: Secondary | ICD-10-CM

## 2014-02-02 DIAGNOSIS — R0602 Shortness of breath: Secondary | ICD-10-CM

## 2014-02-02 DIAGNOSIS — D649 Anemia, unspecified: Secondary | ICD-10-CM

## 2014-02-02 NOTE — Assessment & Plan Note (Signed)
In the past she has required iron in close monitoring of her anemia. Low blood count will likely contribute to shortness of breath and diastolic CHF

## 2014-02-02 NOTE — Assessment & Plan Note (Signed)
Blood pressure is well controlled on today's visit. No changes made to the medications. 

## 2014-02-02 NOTE — Assessment & Plan Note (Signed)
Shortness of breath typically multifactorial including diastolic CHF, anemia, COPD. Recommended she restart Bumex

## 2014-02-02 NOTE — Assessment & Plan Note (Signed)
Recent COPD exacerbation, still weaning her prednisone. She attributes this to her weight gain

## 2014-02-02 NOTE — Progress Notes (Signed)
Patient ID: Catherine Holmes, female    DOB: 09-23-1928, 80 y.o.   MRN: [id number]  HPI Comments: 80 year old woman with a history of DM,  peripheral vascular disease, 60 to 70% carotid arterial disease, moderate arterial disease of the lower extremities, status post bilateral iliac stenting, moderate renal stenosis, hyperlipidemia, history of severe  hypertension with labile blood pressures, history of episodic shortness of breath and chest squeezing relieved with albuterol/nebs who presents for routine followup. She has a sulfa allergy to loop diuretics. History of falls.   left carotid arterial stenosis estimated at 60-79% disease.  Previous weight October 2014 was 148 pounds.  weight at the end of December 2014 he was 160 pounds, she had increasing shortness of breath, had not been taking Bumex  she start Bumex daily. her weight decreased  5 pounds or more, she felt better  She went to the beach and on her return, weight was high, she was found to be anemic by hematology with hemoglobin in the low 8 range. She presented to the hospital with shortness of breath. She had aggressive diuresis and treatment of low sodium with tolvaptam.  she had 5 L diuresis Echocardiogram in the hospital showed normal ejection fraction, moderate to severe, hypertension, moderately dilated left atrium  Has a history of large hiatal hernia She was admitted on 11/02/2013 , discharged on 11/16/2013 In followup today, weight  145 pounds.   She presents today after recent hospital admission admitted on 01/24/2014, discharge from 01/27/2014 for acute respiratory failure secondary to COPD She had minimal diuresis, then felt better on prednisone. No antibiotics were given. In followup today, she reports that she feels very tired. She has not been sleeping well at nighttime for various reasons. She is taking prednisone taper, weight is now up from her baseline in the office to 161 pounds. Goal weight is typically 150  pounds.   in the hospital at [facility] in [facility] for general malaise, severe hypertension, shortness of breath, chest pain. She had a stress test, lexiscan that showed no significant ischemia. Significant shortness of breath with lexiscan.   hospital admission on 01/08/2012 for shortness of breath and syncope. She was found to have significant anemia. Treated with Lasix for diastolic CHF.  Underlying severe left carotid arterial disease estimated at 70%.   Prior followup with Dr. Welton FlakesKhan in pulmonary with numerous CT scans, MRIs, PFTs, sleep study. She was told that she had hypoxemia overnight and was started on nighttime oxygen.   Recent hospitalization 05/27/2013 for acute on chronic diastolic CHF. She was treated with IV Bumex twice a day with improvement of her symptoms. Getting factor was her anemia with hematocrit of 25.  Previous iron infusion. In the second week of September 2014 she fell outside a cracker barrel and hit her head. She has been slow to recover. She still has a knot on her head.  She is not able to tolerate statins. She does handle zetia without significant side effects. previously encouraged to take red yeast rice.   Echocardiogram in the hospital August 2014 shows normal ejection fraction, severely elevated right ventricular systolic pressures, mild to moderate TR  EKG shows normal sinus rhythm with rate 63 beats a minute, no significant ST or T wave changes  Outpatient Encounter Prescriptions as of 02/02/2014  Medication Sig  . albuterol (PROAIR HFA) 108 (90 BASE) MCG/ACT inhaler Inhale 2 puffs into the lungs every 6 (six) hours as needed.  Marland Kitchen. albuterol (PROVENTIL) (2.5 MG/3ML) 0.083% nebulizer solution Take  2.5 mg by nebulization every 6 (six) hours as needed.    Marland Kitchen. arformoterol (BROVANA) 15 MCG/2ML NEBU Take 2 mLs (15 mcg total) by nebulization 2 (two) times daily.  Marland Kitchen. aspirin 81 MG tablet Take 81 mg by mouth daily.    . beta carotene w/minerals (OCUVITE) tablet Take 1 tablet  by mouth 2 (two) times daily.  . bumetanide (BUMEX) 1 MG tablet Take 2 tabs BID  . Cholecalciferol (VITAMIN D-3 PO) Take 1,000 Units by mouth.  . citalopram (CELEXA) 20 MG tablet Take 20 mg by mouth daily.  Marland Kitchen. dexlansoprazole (DEXILANT) 60 MG capsule Take 60 mg by mouth daily as needed.  . diphenhydramine-acetaminophen (TYLENOL PM) 25-500 MG TABS Take 1 tablet by mouth at bedtime as needed.  . doxazosin (CARDURA) 4 MG tablet Take 4 mg by mouth at bedtime.   Marland Kitchen. ezetimibe (ZETIA) 10 MG tablet Take 1 tablet (10 mg total) by mouth daily.  Marland Kitchen. gabapentin (NEURONTIN) 100 MG capsule Take 100 mg by mouth 2 (two) times daily.   . hydrALAZINE (APRESOLINE) 50 MG tablet Take 50 mg by mouth 2 (two) times daily.  Marland Kitchen. HYDROcodone-acetaminophen (NORCO/VICODIN) 5-325 MG per tablet Take 1 tablet by mouth as needed.   . metFORMIN (GLUCOPHAGE) 500 MG tablet Take 500 mg by mouth daily with breakfast.   . mometasone-formoterol (DULERA) 100-5 MCG/ACT AERO Inhale 2 puffs into the lungs.  . Multiple Vitamin (MULTIVITAMIN) tablet Take 1 tablet by mouth daily.    . nebivolol (BYSTOLIC) 5 MG tablet Take 5 mg by mouth daily.  . nitroGLYCERIN (NITROSTAT) 0.4 MG SL tablet Place 1 tablet (0.4 mg total) under the tongue every 5 (five) minutes as needed for chest pain.  . potassium chloride (KLOR-CON 10) 10 MEQ tablet TAKE 1/2 TABLET BY MOUTH TWICE A DAY AS NEEDED WITH BUMEX  . predniSONE (DELTASONE) 10 MG tablet Take 10 mg by mouth. Taper  . spironolactone (ALDACTONE) 25 MG tablet Take 25 mg by mouth daily.  Marland Kitchen. tiotropium (SPIRIVA) 18 MCG inhalation capsule Place 1 capsule (18 mcg total) into inhaler and inhale daily.  Marland Kitchen. triamcinolone (KENALOG) 0.025 % cream Apply 1 application topically as needed.    Review of Systems  Constitutional: Positive for fatigue.       General malaise, tired  HENT: Negative.   Eyes: Negative.   Respiratory: Negative.   Cardiovascular: Negative.   Gastrointestinal: Negative.   Endocrine: Negative.    Musculoskeletal: Positive for arthralgias, gait problem and myalgias.  Skin: Negative.   Allergic/Immunologic: Negative.   Neurological: Positive for weakness.  Hematological: Negative.   Psychiatric/Behavioral: Negative.   All other systems reviewed and are negative.   BP 110/60  Pulse 63  Ht 5\' 1"  (1.549 m)  Wt 161 lb 12 oz (73.369 kg)  BMI 30.58 kg/m2  Physical Exam  Nursing note and vitals reviewed. Constitutional: She is oriented to person, place, and time. She appears well-developed and well-nourished.  HENT:  Head: Normocephalic.  Nose: Nose normal.  Mouth/Throat: Oropharynx is clear and moist.  Eyes: Conjunctivae are normal. Pupils are equal, round, and reactive to light.  Neck: Normal range of motion. Neck supple. No JVD present. Carotid bruit is present.  Cardiovascular: Normal rate, regular rhythm, S1 normal, S2 normal and intact distal pulses.  Exam reveals no gallop and no friction rub.   Murmur heard.  Crescendo systolic murmur is present with a grade of 2/6  Pulmonary/Chest: Effort normal. No respiratory distress. She has decreased breath sounds. She has no wheezes. She  has no rales. She exhibits no tenderness.  Abdominal: Soft. Bowel sounds are normal. She exhibits no distension. There is no tenderness.  Musculoskeletal: Normal range of motion. She exhibits no edema and no tenderness.  Lymphadenopathy:    She has no cervical adenopathy.  Neurological: She is alert and oriented to person, place, and time. Coordination normal.  Skin: Skin is warm and dry. No rash noted. No erythema.  Psychiatric: She has a normal mood and affect. Her behavior is normal. Judgment and thought content normal.    Assessment and Plan

## 2014-02-02 NOTE — Assessment & Plan Note (Signed)
Her weight is high on today's visit. Probably up 10 pounds. Her weight at home is 155 pounds, 160 in our office. Goal weight at home is likely 150 pounds or less. Recommended she restart Bumex 1 mg daily, 1 mg twice a day if needed for weight to achieve goal 155 pounds at home.

## 2014-02-02 NOTE — Patient Instructions (Addendum)
Please restart bumex daily as needed for weight gain Monitor your weight  The number for [facility] at [facility] is : [phone]  Please call us if you have new issues that need to be addressed before your next appt.  Your physician wants you to follow-up in: 1 month.

## 2014-02-23 ENCOUNTER — Ambulatory Visit: Payer: Self-pay | Admitting: Hematology and Oncology

## 2014-02-23 LAB — CBC CANCER CENTER
BASOS ABS: 0 x10 3/mm (ref 0.0–0.1)
BASOS PCT: 0.4 %
Eosinophil #: 0.1 x10 3/mm (ref 0.0–0.7)
Eosinophil %: 2 %
HCT: 30.1 % — AB (ref 35.0–47.0)
HGB: 10 g/dL — ABNORMAL LOW (ref 12.0–16.0)
LYMPHS PCT: 17.4 %
Lymphocyte #: 1 x10 3/mm (ref 1.0–3.6)
MCH: 29.5 pg (ref 26.0–34.0)
MCHC: 33.1 g/dL (ref 32.0–36.0)
MCV: 89 fL (ref 80–100)
Monocyte #: 0.4 x10 3/mm (ref 0.2–0.9)
Monocyte %: 7.7 %
NEUTROS PCT: 72.5 %
Neutrophil #: 4.1 x10 3/mm (ref 1.4–6.5)
PLATELETS: 268 x10 3/mm (ref 150–440)
RBC: 3.37 10*6/uL — AB (ref 3.80–5.20)
RDW: 14.7 % — AB (ref 11.5–14.5)
WBC: 5.6 x10 3/mm (ref 3.6–11.0)

## 2014-02-23 LAB — FERRITIN: Ferritin (ARMC): 29 ng/mL (ref 8–388)

## 2014-02-23 LAB — IRON AND TIBC
Iron Bind.Cap.(Total): 339 ug/dL (ref 250–450)
Iron Saturation: 11 %
Iron: 37 ug/dL — ABNORMAL LOW (ref 50–170)
Unbound Iron-Bind.Cap.: 302 ug/dL

## 2014-02-23 LAB — RETICULOCYTES
ABSOLUTE RETIC COUNT: 0.0638 10*6/uL (ref 0.019–0.186)
Reticulocyte: 1.89 % (ref 0.4–3.1)

## 2014-02-23 LAB — TSH: Thyroid Stimulating Horm: 1.77 u[IU]/mL

## 2014-02-25 DIAGNOSIS — D649 Anemia, unspecified: Secondary | ICD-10-CM

## 2014-02-25 DIAGNOSIS — J449 Chronic obstructive pulmonary disease, unspecified: Secondary | ICD-10-CM

## 2014-02-25 DIAGNOSIS — R0602 Shortness of breath: Secondary | ICD-10-CM

## 2014-02-25 DIAGNOSIS — I509 Heart failure, unspecified: Secondary | ICD-10-CM

## 2014-02-25 DIAGNOSIS — I5032 Chronic diastolic (congestive) heart failure: Secondary | ICD-10-CM

## 2014-03-11 ENCOUNTER — Ambulatory Visit (INDEPENDENT_AMBULATORY_CARE_PROVIDER_SITE_OTHER): Payer: Medicare Other | Admitting: Cardiovascular Disease

## 2014-03-11 ENCOUNTER — Encounter: Payer: Self-pay | Admitting: Cardiovascular Disease

## 2014-03-11 VITALS — BP 100/64 | HR 61 | Ht 61.0 in | Wt 158.0 lb

## 2014-03-11 DIAGNOSIS — I10 Essential (primary) hypertension: Secondary | ICD-10-CM

## 2014-03-11 DIAGNOSIS — I509 Heart failure, unspecified: Secondary | ICD-10-CM

## 2014-03-11 DIAGNOSIS — R079 Chest pain, unspecified: Secondary | ICD-10-CM

## 2014-03-11 DIAGNOSIS — J449 Chronic obstructive pulmonary disease, unspecified: Secondary | ICD-10-CM

## 2014-03-11 DIAGNOSIS — R0602 Shortness of breath: Secondary | ICD-10-CM

## 2014-03-11 NOTE — Progress Notes (Signed)
Patient ID: Catherine Holmes, female    DOB: 1927/11/18, 80 y.o.   MRN: [id number]  HPI Comments: 80 year old woman with a history of DM,  peripheral vascular disease, 60 to 70% carotid arterial disease, moderate arterial disease of the lower extremities, status post bilateral iliac stenting, moderate renal stenosis, hyperlipidemia, history of severe  hypertension with labile blood pressures, history of episodic shortness of breath and chest squeezing relieved with albuterol/nebs who presents for routine followup. She has a sulfa allergy to loop diuretics. History of falls.   left carotid arterial stenosis estimated at 60-79% disease. She has had several admissions for diastolic CHF, typically from dietary indiscretion and periodic noncompliance with diuretics She was admitted on 11/02/2013 , discharged on 11/16/2013  admitted on 01/24/2014, discharge from 01/27/2014 for acute respiratory failure secondary to COPD History of anemia followed by hematology  Echocardiogram in the hospital showed normal ejection fraction, moderate to severe, hypertension, moderately dilated left atrium  Has a history of large hiatal hernia  In followup today, she reports that her weight at home is typically 155-156. Goal weight is 150 pounds. She has been eating more. She does have some swelling of her feet. Blood pressure has been elevated at home typically 150-160 systolic. She reports this is before she takes her medications. Otherwise she feels well with no complaints. She is not able to tolerate statins. She does handle zetia without significant side effects. previously encouraged to take red yeast rice.   Previous weight October 2014 was 148 pounds.  weight at the end of December 2014 was 160 pounds, she had increasing shortness of breath, had not been taking Bumex    in the hospital at [facility] in 2012 for general malaise, severe hypertension, shortness of breath, chest pain. She had a stress test, lexiscan that  showed no significant ischemia. Significant shortness of breath with lexiscan.   hospital admission on 01/08/2012 for shortness of breath and syncope. She was found to have significant anemia. Treated with Lasix for diastolic CHF.  Underlying severe left carotid arterial disease estimated at 70%.   Prior followup with Dr. Welton FlakesKhan in pulmonary with numerous CT scans, MRIs, PFTs, sleep study. She was told that she had hypoxemia overnight and was started on nighttime oxygen.   Recent hospitalization 05/27/2013 for acute on chronic diastolic CHF. She was treated with IV Bumex twice a day with improvement of her symptoms. Getting factor was her anemia with hematocrit of 25.  Previous iron infusion. In the second week of September 2014 she fell outside a cracker barrel and hit her head. She has been slow to recover. She still has a knot on her head.  Echocardiogram in the hospital August 2014 shows normal ejection fraction, severely elevated right ventricular systolic pressures, mild to moderate TR  EKG shows normal sinus rhythm with rate 61 beats a minute, no significant ST or T wave changes  Outpatient Encounter Prescriptions as of 03/11/2014  Medication Sig  . albuterol (PROAIR HFA) 108 (90 BASE) MCG/ACT inhaler Inhale 2 puffs into the lungs every 6 (six) hours as needed.  Marland Kitchen. albuterol (PROVENTIL) (2.5 MG/3ML) 0.083% nebulizer solution Take 2.5 mg by nebulization every 6 (six) hours as needed.    Marland Kitchen. arformoterol (BROVANA) 15 MCG/2ML NEBU Take 2 mLs (15 mcg total) by nebulization 2 (two) times daily.  Marland Kitchen. aspirin 81 MG tablet Take 81 mg by mouth daily.    . beta carotene w/minerals (OCUVITE) tablet Take 1 tablet by mouth 2 (two) times daily.  .Marland Kitchen  bumetanide (BUMEX) 1 MG tablet Take 2 tabs BID  . Cholecalciferol (VITAMIN D-3 PO) Take 1,000 Units by mouth.  . citalopram (CELEXA) 20 MG tablet Take 20 mg by mouth daily.  Marland Kitchen. dexlansoprazole (DEXILANT) 60 MG capsule Take 60 mg by mouth daily as needed.  .  diphenhydramine-acetaminophen (TYLENOL PM) 25-500 MG TABS Take 1 tablet by mouth at bedtime as needed.  . doxazosin (CARDURA) 4 MG tablet Take 4 mg by mouth at bedtime.   Marland Kitchen. ezetimibe (ZETIA) 10 MG tablet Take 1 tablet (10 mg total) by mouth daily.  Marland Kitchen. gabapentin (NEURONTIN) 100 MG capsule Take 100 mg by mouth 2 (two) times daily.   . hydrALAZINE (APRESOLINE) 50 MG tablet Take 50 mg by mouth 2 (two) times daily.  Marland Kitchen. HYDROcodone-acetaminophen (NORCO/VICODIN) 5-325 MG per tablet Take 1 tablet by mouth as needed.   . metFORMIN (GLUCOPHAGE) 500 MG tablet Take 500 mg by mouth daily as needed.   . mometasone-formoterol (DULERA) 100-5 MCG/ACT AERO Inhale 2 puffs into the lungs.  . Multiple Vitamin (MULTIVITAMIN) tablet Take 1 tablet by mouth daily.    . nebivolol (BYSTOLIC) 5 MG tablet Take 5 mg by mouth daily.  . nitroGLYCERIN (NITROSTAT) 0.4 MG SL tablet Place 1 tablet (0.4 mg total) under the tongue every 5 (five) minutes as needed for chest pain.  . potassium chloride (KLOR-CON 10) 10 MEQ tablet TAKE 1/2 TABLET BY MOUTH TWICE A DAY AS NEEDED WITH BUMEX  . predniSONE (DELTASONE) 10 MG tablet Take 10 mg by mouth. Taper  . spironolactone (ALDACTONE) 25 MG tablet Take 25 mg by mouth daily.  Marland Kitchen. tiotropium (SPIRIVA) 18 MCG inhalation capsule Place 1 capsule (18 mcg total) into inhaler and inhale daily.  Marland Kitchen. triamcinolone (KENALOG) 0.025 % cream Apply 1 application topically as needed.     Review of Systems  HENT: Negative.   Eyes: Negative.   Respiratory: Negative.   Cardiovascular: Negative.   Gastrointestinal: Negative.   Endocrine: Negative.   Musculoskeletal: Positive for arthralgias, gait problem and myalgias.  Skin: Negative.   Allergic/Immunologic: Negative.   Neurological: Negative.   Hematological: Negative.   Psychiatric/Behavioral: Negative.   All other systems reviewed and are negative.   BP 100/64  Pulse 61  Ht 5\' 1"  (1.549 m)  Wt 158 lb (71.668 kg)  BMI 29.87 kg/m2  Physical  Exam  Nursing note and vitals reviewed. Constitutional: She is oriented to person, place, and time. She appears well-developed and well-nourished.  HENT:  Head: Normocephalic.  Nose: Nose normal.  Mouth/Throat: Oropharynx is clear and moist.  Eyes: Conjunctivae are normal. Pupils are equal, round, and reactive to light.  Neck: Normal range of motion. Neck supple. No JVD present. Carotid bruit is present.  Cardiovascular: Normal rate, regular rhythm, S1 normal, S2 normal and intact distal pulses.  Exam reveals no gallop and no friction rub.   Murmur heard.  Crescendo systolic murmur is present with a grade of 2/6  Pulmonary/Chest: Effort normal. No respiratory distress. She has decreased breath sounds. She has no wheezes. She has no rales. She exhibits no tenderness.  Abdominal: Soft. Bowel sounds are normal. She exhibits no distension. There is no tenderness.  Musculoskeletal: Normal range of motion. She exhibits no edema and no tenderness.  Lymphadenopathy:    She has no cervical adenopathy.  Neurological: She is alert and oriented to person, place, and time. Coordination normal.  Skin: Skin is warm and dry. No rash noted. No erythema.  Psychiatric: She has a normal mood and  affect. Her behavior is normal. Judgment and thought content normal.    Assessment and Plan

## 2014-03-11 NOTE — Patient Instructions (Addendum)
Goal weight at home is likely 150 pounds  restart Bumex 1 mg daily,  Take Bumex 1 mg twice a day if needed for higher (157 or higher)  Please call us if you have new issues that need to be addressed before your next appt.  Your physician wants you to follow-up in: 3 months.  You will receive a reminder letter in the mail two months in advance. If you don't receive a letter, please call our office to schedule the follow-up appointment.

## 2014-03-19 NOTE — Assessment & Plan Note (Signed)
Blood pressure is well controlled on today's visit. No changes made to the medications. 

## 2014-03-19 NOTE — Assessment & Plan Note (Addendum)
Goal weight at home is likely 150 pounds  Recommended that she restart Bumex 1 mg daily,  Take Bumex 1 mg twice a day if needed for higher (157 or higher)

## 2014-03-19 NOTE — Assessment & Plan Note (Signed)
Chronic mild stable shortness of breath.

## 2014-03-22 ENCOUNTER — Ambulatory Visit: Payer: Self-pay | Admitting: Hematology and Oncology

## 2014-03-25 LAB — CBC CANCER CENTER
Basophil #: 0 x10 3/mm (ref 0.0–0.1)
Basophil %: 0.5 %
EOS ABS: 0.2 x10 3/mm (ref 0.0–0.7)
Eosinophil %: 2.7 %
HCT: 33.9 % — ABNORMAL LOW (ref 35.0–47.0)
HGB: 10.9 g/dL — ABNORMAL LOW (ref 12.0–16.0)
LYMPHS PCT: 15.5 %
Lymphocyte #: 1.1 x10 3/mm (ref 1.0–3.6)
MCH: 29.8 pg (ref 26.0–34.0)
MCHC: 32.3 g/dL (ref 32.0–36.0)
MCV: 92 fL (ref 80–100)
MONO ABS: 0.4 x10 3/mm (ref 0.2–0.9)
Monocyte %: 6.1 %
NEUTROS ABS: 5.3 x10 3/mm (ref 1.4–6.5)
Neutrophil %: 75.2 %
Platelet: 251 x10 3/mm (ref 150–440)
RBC: 3.67 10*6/uL — ABNORMAL LOW (ref 3.80–5.20)
RDW: 17 % — ABNORMAL HIGH (ref 11.5–14.5)
WBC: 7 x10 3/mm (ref 3.6–11.0)

## 2014-03-25 LAB — FERRITIN: Ferritin (ARMC): 92 ng/mL (ref 8–388)

## 2014-03-25 LAB — IRON AND TIBC
IRON BIND. CAP.(TOTAL): 303 ug/dL (ref 250–450)
Iron Saturation: 17 %
Iron: 52 ug/dL (ref 50–170)
UNBOUND IRON-BIND. CAP.: 251 ug/dL

## 2014-04-21 ENCOUNTER — Ambulatory Visit: Payer: Self-pay | Admitting: Hematology and Oncology

## 2014-05-02 ENCOUNTER — Other Ambulatory Visit: Payer: Self-pay | Admitting: Cardiovascular Disease

## 2014-05-04 ENCOUNTER — Inpatient Hospital Stay: Payer: Self-pay | Admitting: Family Medicine

## 2014-05-04 ENCOUNTER — Telehealth: Payer: Self-pay | Admitting: *Deleted

## 2014-05-04 ENCOUNTER — Telehealth: Payer: Self-pay | Admitting: Cardiovascular Disease

## 2014-05-04 LAB — COMPREHENSIVE METABOLIC PANEL
ALBUMIN: 3.8 g/dL (ref 3.4–5.0)
ANION GAP: 5 — AB (ref 7–16)
AST: 26 U/L (ref 15–37)
Alkaline Phosphatase: 94 U/L
BUN: 11 mg/dL (ref 7–18)
Bilirubin,Total: 0.3 mg/dL (ref 0.2–1.0)
Calcium, Total: 9.7 mg/dL (ref 8.5–10.1)
Chloride: 85 mmol/L — ABNORMAL LOW (ref 98–107)
Co2: 33 mmol/L — ABNORMAL HIGH (ref 21–32)
Creatinine: 0.64 mg/dL (ref 0.60–1.30)
EGFR (African American): >60
EGFR (Non-African Amer.): >60
GLUCOSE: 106 mg/dL — AB (ref 65–99)
Osmolality: 248 (ref 275–301)
Potassium: 4.6 mmol/L (ref 3.5–5.1)
SGPT (ALT): 26 U/L (ref 12–78)
SODIUM: 123 mmol/L — AB (ref 136–145)
Total Protein: 7.3 g/dL (ref 6.4–8.2)

## 2014-05-04 LAB — TROPONIN I: Troponin-I: <0.02 ng/mL

## 2014-05-04 LAB — URINALYSIS, COMPLETE
Bacteria: NONE SEEN
Bilirubin,UR: NEGATIVE
Blood: NEGATIVE
GLUCOSE, UR: NEGATIVE mg/dL (ref 0–75)
Ketone: NEGATIVE
Leukocyte Esterase: NEGATIVE
Nitrite: NEGATIVE
Ph: 9 (ref 4.5–8.0)
Protein: NEGATIVE
Specific Gravity: 1.005 (ref 1.003–1.030)
Squamous Epithelial: NONE SEEN
WBC UR: 2 /HPF (ref 0–5)

## 2014-05-04 LAB — CBC
HCT: 39.3 % (ref 35.0–47.0)
HGB: 13.2 g/dL (ref 12.0–16.0)
MCH: 29.6 pg (ref 26.0–34.0)
MCHC: 33.5 g/dL (ref 32.0–36.0)
MCV: 88 fL (ref 80–100)
Platelet: 333 10*3/uL (ref 150–440)
RBC: 4.45 10*6/uL (ref 3.80–5.20)
RDW: 15.6 % — AB (ref 11.5–14.5)
WBC: 9.3 10*3/uL (ref 3.6–11.0)

## 2014-05-04 LAB — CK TOTAL AND CKMB (NOT AT ARMC)
CK, Total: 101 U/L
CK-MB: 3.5 ng/mL (ref 0.5–3.6)

## 2014-05-04 NOTE — Telephone Encounter (Signed)
Pt's daughter called stating that pt's BP is elevated at 194/88 and pt is c/o facial numbness.  She states that she is concerned that pt may be having a stroke.  She is en route to pt's home, as pt refuses to go to ED.  Called and spoke w/ pt. She speaks well and answers my questions appropriately. She reports that her BP is 194/88 after being checked 4 times w/ 2 different machines.  Advised pt to take hydralazine and not to recheck BP until later this evening.  Advised pt that if sx return, to call 911 if she feels sx are urgent in nature, or call the office if she is unsure.  She verbalizes understanding and will call w/ further questions or concerns.

## 2014-05-04 NOTE — Telephone Encounter (Signed)
Pt daugther calling about mother saying bp is high  bp 183/94  bp 198/96 Numbness up around nose and lips.pt does not want to go hospital thus calling us first.

## 2014-05-04 NOTE — Telephone Encounter (Signed)
185/86 bp was taken 4:30p

## 2014-05-05 LAB — CBC WITH DIFFERENTIAL/PLATELET
Basophil #: 0 10*3/uL (ref 0.0–0.1)
Basophil %: 0.4 %
Eosinophil #: 0.1 10*3/uL (ref 0.0–0.7)
Eosinophil %: 1.5 %
HCT: 36.8 % (ref 35.0–47.0)
HGB: 12.2 g/dL (ref 12.0–16.0)
Lymphocyte #: 1.6 10*3/uL (ref 1.0–3.6)
Lymphocyte %: 22.1 %
MCH: 29.7 pg (ref 26.0–34.0)
MCHC: 33.1 g/dL (ref 32.0–36.0)
MCV: 90 fL (ref 80–100)
MONOS PCT: 8.8 %
Monocyte #: 0.6 x10 3/mm (ref 0.2–0.9)
NEUTROS PCT: 67.2 %
Neutrophil #: 4.8 10*3/uL (ref 1.4–6.5)
Platelet: 322 10*3/uL (ref 150–440)
RBC: 4.1 10*6/uL (ref 3.80–5.20)
RDW: 15.5 % — AB (ref 11.5–14.5)
WBC: 7.2 10*3/uL (ref 3.6–11.0)

## 2014-05-05 LAB — BASIC METABOLIC PANEL
ANION GAP: 7 (ref 7–16)
BUN: 11 mg/dL (ref 7–18)
CREATININE: 0.69 mg/dL (ref 0.60–1.30)
Calcium, Total: 8.8 mg/dL (ref 8.5–10.1)
Chloride: 86 mmol/L — ABNORMAL LOW (ref 98–107)
Co2: 32 mmol/L (ref 21–32)
EGFR (African American): >60
Glucose: 96 mg/dL (ref 65–99)
Osmolality: 251 (ref 275–301)
Potassium: 4.2 mmol/L (ref 3.5–5.1)
Sodium: 125 mmol/L — ABNORMAL LOW (ref 136–145)

## 2014-05-05 LAB — MAGNESIUM: Magnesium: 1.2 mg/dL — ABNORMAL LOW

## 2014-05-07 ENCOUNTER — Inpatient Hospital Stay: Payer: Self-pay | Admitting: Family Medicine

## 2014-05-07 ENCOUNTER — Telehealth: Payer: Self-pay | Admitting: *Deleted

## 2014-05-07 DIAGNOSIS — I5032 Chronic diastolic (congestive) heart failure: Secondary | ICD-10-CM | POA: Diagnosis not present

## 2014-05-07 DIAGNOSIS — Z01810 Encounter for preprocedural cardiovascular examination: Secondary | ICD-10-CM

## 2014-05-07 LAB — COMPREHENSIVE METABOLIC PANEL
ALK PHOS: 80 U/L
ALT: 34 U/L (ref 12–78)
Albumin: 3.5 g/dL (ref 3.4–5.0)
Anion Gap: 6 — ABNORMAL LOW (ref 7–16)
BUN: 23 mg/dL — AB (ref 7–18)
Bilirubin,Total: 0.3 mg/dL (ref 0.2–1.0)
Calcium, Total: 8.6 mg/dL (ref 8.5–10.1)
Chloride: 93 mmol/L — ABNORMAL LOW (ref 98–107)
Co2: 30 mmol/L (ref 21–32)
Creatinine: 0.69 mg/dL (ref 0.60–1.30)
EGFR (African American): >60
EGFR (Non-African Amer.): >60
GLUCOSE: 108 mg/dL — AB (ref 65–99)
Osmolality: 263 (ref 275–301)
Potassium: 4.4 mmol/L (ref 3.5–5.1)
SGOT(AST): 31 U/L (ref 15–37)
Sodium: 129 mmol/L — ABNORMAL LOW (ref 136–145)
Total Protein: 6.7 g/dL (ref 6.4–8.2)

## 2014-05-07 LAB — CBC WITH DIFFERENTIAL/PLATELET
Basophil #: 0 10*3/uL (ref 0.0–0.1)
Basophil %: 0.3 %
EOS PCT: 0.1 %
Eosinophil #: 0 10*3/uL (ref 0.0–0.7)
HCT: 34.3 % — ABNORMAL LOW (ref 35.0–47.0)
HGB: 11.3 g/dL — ABNORMAL LOW (ref 12.0–16.0)
LYMPHS ABS: 0.9 10*3/uL — AB (ref 1.0–3.6)
Lymphocyte %: 6.2 %
MCH: 30 pg (ref 26.0–34.0)
MCHC: 32.9 g/dL (ref 32.0–36.0)
MCV: 91 fL (ref 80–100)
MONOS PCT: 6.8 %
Monocyte #: 1 x10 3/mm — ABNORMAL HIGH (ref 0.2–0.9)
Neutrophil #: 13.2 10*3/uL — ABNORMAL HIGH (ref 1.4–6.5)
Neutrophil %: 86.6 %
Platelet: 306 10*3/uL (ref 150–440)
RBC: 3.76 10*6/uL — ABNORMAL LOW (ref 3.80–5.20)
RDW: 16.2 % — ABNORMAL HIGH (ref 11.5–14.5)
WBC: 15.2 10*3/uL — ABNORMAL HIGH (ref 3.6–11.0)

## 2014-05-07 LAB — PROTIME-INR
INR: 0.9
INR: 0.9
Prothrombin Time: 12.1 secs (ref 11.5–14.7)
Prothrombin Time: 12 secs (ref 11.5–14.7)

## 2014-05-07 LAB — URINALYSIS, COMPLETE
BLOOD: NEGATIVE
Bilirubin,UR: NEGATIVE
GLUCOSE, UR: NEGATIVE mg/dL (ref 0–75)
Ketone: NEGATIVE
Nitrite: NEGATIVE
PH: 5 (ref 4.5–8.0)
Protein: NEGATIVE
RBC,UR: 1 /HPF (ref 0–5)
SPECIFIC GRAVITY: 1.012 (ref 1.003–1.030)

## 2014-05-07 LAB — BASIC METABOLIC PANEL
Anion Gap: 2 — ABNORMAL LOW (ref 7–16)
BUN: 24 mg/dL — AB (ref 7–18)
CALCIUM: 8.2 mg/dL — AB (ref 8.5–10.1)
Chloride: 92 mmol/L — ABNORMAL LOW (ref 98–107)
Co2: 30 mmol/L (ref 21–32)
Creatinine: 0.89 mg/dL (ref 0.60–1.30)
EGFR (Non-African Amer.): 60 — ABNORMAL LOW
Glucose: 130 mg/dL — ABNORMAL HIGH (ref 65–99)
OSMOLALITY: 255 (ref 275–301)
Potassium: 4.6 mmol/L (ref 3.5–5.1)
SODIUM: 124 mmol/L — AB (ref 136–145)

## 2014-05-07 LAB — CBC
HCT: 36.6 % (ref 35.0–47.0)
HGB: 12.1 g/dL (ref 12.0–16.0)
MCH: 29.8 pg (ref 26.0–34.0)
MCHC: 33 g/dL (ref 32.0–36.0)
MCV: 90 fL (ref 80–100)
PLATELETS: 295 10*3/uL (ref 150–440)
RBC: 4.05 10*6/uL (ref 3.80–5.20)
RDW: 15.7 % — ABNORMAL HIGH (ref 11.5–14.5)
WBC: 7.5 10*3/uL (ref 3.6–11.0)

## 2014-05-07 LAB — TROPONIN I: Troponin-I: <0.02 ng/mL

## 2014-05-07 LAB — APTT: Activated PTT: 29.4 secs (ref 23.6–35.9)

## 2014-05-07 LAB — CK-MB
CK-MB: 4.5 ng/mL — AB (ref 0.5–3.6)
CK-MB: 4.2 ng/mL — AB (ref 0.5–3.6)
CK-MB: 3.8 ng/mL — ABNORMAL HIGH (ref 0.5–3.6)

## 2014-05-07 NOTE — Telephone Encounter (Signed)
Pt is in room [facility] for left hip fracture s/p fall. She wanted to make Dr. Mariah MillingGollan aware.

## 2014-05-07 NOTE — Telephone Encounter (Signed)
Daughter called and wanted to know if Dr. Mariah MillingGollan was going to see her mother in the hospital?

## 2014-05-08 LAB — CBC WITH DIFFERENTIAL/PLATELET
BASOS ABS: 0 10*3/uL (ref 0.0–0.1)
BASOS PCT: 0.3 %
EOS ABS: 0.1 10*3/uL (ref 0.0–0.7)
Eosinophil %: 1.3 %
HCT: 33.3 % — AB (ref 35.0–47.0)
HGB: 10.8 g/dL — AB (ref 12.0–16.0)
LYMPHS ABS: 0.9 10*3/uL — AB (ref 1.0–3.6)
Lymphocyte %: 8.9 %
MCH: 29.8 pg (ref 26.0–34.0)
MCHC: 32.5 g/dL (ref 32.0–36.0)
MCV: 92 fL (ref 80–100)
MONO ABS: 0.8 x10 3/mm (ref 0.2–0.9)
Monocyte %: 7.5 %
NEUTROS ABS: 8.6 10*3/uL — AB (ref 1.4–6.5)
Neutrophil %: 82 %
PLATELETS: 257 10*3/uL (ref 150–440)
RBC: 3.63 10*6/uL — ABNORMAL LOW (ref 3.80–5.20)
RDW: 16.2 % — ABNORMAL HIGH (ref 11.5–14.5)
WBC: 10.5 10*3/uL (ref 3.6–11.0)

## 2014-05-08 LAB — TROPONIN I
TROPONIN-I: 0.02 ng/mL
Troponin-I: <0.02 ng/mL
Troponin-I: <0.02 ng/mL

## 2014-05-08 LAB — BASIC METABOLIC PANEL
Anion Gap: 5 — ABNORMAL LOW (ref 7–16)
BUN: 22 mg/dL — AB (ref 7–18)
CALCIUM: 8.3 mg/dL — AB (ref 8.5–10.1)
CHLORIDE: 92 mmol/L — AB (ref 98–107)
CO2: 30 mmol/L (ref 21–32)
CREATININE: 0.84 mg/dL (ref 0.60–1.30)
EGFR (African American): >60
Glucose: 116 mg/dL — ABNORMAL HIGH (ref 65–99)
Osmolality: 260 (ref 275–301)
Potassium: 4.7 mmol/L (ref 3.5–5.1)
Sodium: 127 mmol/L — ABNORMAL LOW (ref 136–145)

## 2014-05-08 LAB — TSH: Thyroid Stimulating Horm: 0.967 u[IU]/mL

## 2014-05-09 ENCOUNTER — Other Ambulatory Visit: Payer: Self-pay

## 2014-05-09 DIAGNOSIS — R9431 Abnormal electrocardiogram [ECG] [EKG]: Secondary | ICD-10-CM

## 2014-05-09 LAB — CBC WITH DIFFERENTIAL/PLATELET
Basophil #: 0.1 10*3/uL (ref 0.0–0.1)
Basophil %: 0.7 %
EOS ABS: 0.5 10*3/uL (ref 0.0–0.7)
Eosinophil %: 5.6 %
HCT: 32.2 % — AB (ref 35.0–47.0)
HGB: 10.6 g/dL — AB (ref 12.0–16.0)
LYMPHS ABS: 0.9 10*3/uL — AB (ref 1.0–3.6)
LYMPHS PCT: 8.9 %
MCH: 30.1 pg (ref 26.0–34.0)
MCHC: 33 g/dL (ref 32.0–36.0)
MCV: 92 fL (ref 80–100)
Monocyte #: 0.8 x10 3/mm (ref 0.2–0.9)
Monocyte %: 8 %
NEUTROS PCT: 76.8 %
Neutrophil #: 7.5 10*3/uL — ABNORMAL HIGH (ref 1.4–6.5)
Platelet: 208 10*3/uL (ref 150–440)
RBC: 3.52 10*6/uL — AB (ref 3.80–5.20)
RDW: 15.4 % — AB (ref 11.5–14.5)
WBC: 9.7 10*3/uL (ref 3.6–11.0)

## 2014-05-09 LAB — BASIC METABOLIC PANEL
Anion Gap: 7 (ref 7–16)
BUN: 17 mg/dL (ref 7–18)
Calcium, Total: 8.3 mg/dL — ABNORMAL LOW (ref 8.5–10.1)
Chloride: 93 mmol/L — ABNORMAL LOW (ref 98–107)
Co2: 30 mmol/L (ref 21–32)
Creatinine: 0.72 mg/dL (ref 0.60–1.30)
EGFR (African American): >60
EGFR (Non-African Amer.): >60
Glucose: 116 mg/dL — ABNORMAL HIGH (ref 65–99)
Osmolality: 263 (ref 275–301)
Potassium: 4.4 mmol/L (ref 3.5–5.1)
Sodium: 130 mmol/L — ABNORMAL LOW (ref 136–145)

## 2014-05-09 LAB — COMPREHENSIVE METABOLIC PANEL
ALK PHOS: 82 U/L
ALT: 29 U/L (ref 12–78)
Albumin: 3 g/dL — ABNORMAL LOW (ref 3.4–5.0)
BILIRUBIN TOTAL: 0.3 mg/dL (ref 0.2–1.0)
SGOT(AST): 24 U/L (ref 15–37)
Total Protein: 6.1 g/dL — ABNORMAL LOW (ref 6.4–8.2)

## 2014-05-09 LAB — TROPONIN I: Troponin-I: 0.03 ng/mL

## 2014-05-09 LAB — MAGNESIUM: MAGNESIUM: 1.2 mg/dL — AB

## 2014-05-10 DIAGNOSIS — I5032 Chronic diastolic (congestive) heart failure: Secondary | ICD-10-CM | POA: Diagnosis not present

## 2014-05-10 DIAGNOSIS — I4892 Unspecified atrial flutter: Secondary | ICD-10-CM

## 2014-05-10 DIAGNOSIS — I4891 Unspecified atrial fibrillation: Secondary | ICD-10-CM | POA: Diagnosis not present

## 2014-05-10 LAB — COMPREHENSIVE METABOLIC PANEL
ALBUMIN: 2.7 g/dL — AB (ref 3.4–5.0)
Alkaline Phosphatase: 94 U/L
Anion Gap: 7 (ref 7–16)
BUN: 14 mg/dL (ref 7–18)
Bilirubin,Total: 0.3 mg/dL (ref 0.2–1.0)
CALCIUM: 8.3 mg/dL — AB (ref 8.5–10.1)
CHLORIDE: 94 mmol/L — AB (ref 98–107)
CREATININE: 0.55 mg/dL — AB (ref 0.60–1.30)
Co2: 31 mmol/L (ref 21–32)
EGFR (African American): >60
Glucose: 109 mg/dL — ABNORMAL HIGH (ref 65–99)
OSMOLALITY: 266 (ref 275–301)
Potassium: 4.4 mmol/L (ref 3.5–5.1)
SGOT(AST): 35 U/L (ref 15–37)
SGPT (ALT): 39 U/L (ref 12–78)
SODIUM: 132 mmol/L — AB (ref 136–145)
Total Protein: 6.2 g/dL — ABNORMAL LOW (ref 6.4–8.2)

## 2014-05-10 LAB — CBC WITH DIFFERENTIAL/PLATELET
Basophil #: 0 10*3/uL (ref 0.0–0.1)
Basophil %: 0.3 %
EOS PCT: 4.3 %
Eosinophil #: 0.4 10*3/uL (ref 0.0–0.7)
HCT: 31.7 % — AB (ref 35.0–47.0)
HGB: 10.3 g/dL — AB (ref 12.0–16.0)
LYMPHS ABS: 0.9 10*3/uL — AB (ref 1.0–3.6)
LYMPHS PCT: 10.9 %
MCH: 29.6 pg (ref 26.0–34.0)
MCHC: 32.3 g/dL (ref 32.0–36.0)
MCV: 92 fL (ref 80–100)
MONOS PCT: 11.1 %
Monocyte #: 0.9 x10 3/mm (ref 0.2–0.9)
NEUTROS ABS: 6.2 10*3/uL (ref 1.4–6.5)
Neutrophil %: 73.4 %
PLATELETS: 216 10*3/uL (ref 150–440)
RBC: 3.46 10*6/uL — ABNORMAL LOW (ref 3.80–5.20)
RDW: 15.7 % — ABNORMAL HIGH (ref 11.5–14.5)
WBC: 8.4 10*3/uL (ref 3.6–11.0)

## 2014-05-10 LAB — TROPONIN I: TROPONIN-I: 0.7 ng/mL — AB

## 2014-05-10 LAB — CK-MB
CK-MB: 4.1 ng/mL — ABNORMAL HIGH (ref 0.5–3.6)
CK-MB: 3.6 ng/mL (ref 0.5–3.6)
CK-MB: 3.3 ng/mL (ref 0.5–3.6)

## 2014-05-11 DIAGNOSIS — I5032 Chronic diastolic (congestive) heart failure: Secondary | ICD-10-CM | POA: Diagnosis not present

## 2014-05-11 DIAGNOSIS — I4891 Unspecified atrial fibrillation: Secondary | ICD-10-CM | POA: Diagnosis not present

## 2014-05-11 DIAGNOSIS — I4892 Unspecified atrial flutter: Secondary | ICD-10-CM | POA: Diagnosis not present

## 2014-05-11 LAB — CBC WITH DIFFERENTIAL/PLATELET
Basophil #: 0 10*3/uL (ref 0.0–0.1)
Basophil %: 0.3 %
Eosinophil #: 0.3 10*3/uL (ref 0.0–0.7)
Eosinophil %: 2.4 %
HCT: 27.5 % — AB (ref 35.0–47.0)
HGB: 8.9 g/dL — AB (ref 12.0–16.0)
Lymphocyte #: 0.5 10*3/uL — ABNORMAL LOW (ref 1.0–3.6)
Lymphocyte %: 4.5 %
MCH: 29.6 pg (ref 26.0–34.0)
MCHC: 32.5 g/dL (ref 32.0–36.0)
MCV: 91 fL (ref 80–100)
Monocyte #: 1.2 x10 3/mm — ABNORMAL HIGH (ref 0.2–0.9)
Monocyte %: 10 %
NEUTROS PCT: 82.8 %
Neutrophil #: 9.9 10*3/uL — ABNORMAL HIGH (ref 1.4–6.5)
Platelet: 228 10*3/uL (ref 150–440)
RBC: 3.02 10*6/uL — AB (ref 3.80–5.20)
RDW: 15.3 % — ABNORMAL HIGH (ref 11.5–14.5)
WBC: 12 10*3/uL — AB (ref 3.6–11.0)

## 2014-05-11 LAB — BASIC METABOLIC PANEL
Anion Gap: 6 — ABNORMAL LOW (ref 7–16)
BUN: 18 mg/dL (ref 7–18)
Calcium, Total: 8 mg/dL — ABNORMAL LOW (ref 8.5–10.1)
Chloride: 93 mmol/L — ABNORMAL LOW (ref 98–107)
Co2: 33 mmol/L — ABNORMAL HIGH (ref 21–32)
Creatinine: 0.65 mg/dL (ref 0.60–1.30)
EGFR (African American): >60
EGFR (Non-African Amer.): >60
Glucose: 137 mg/dL — ABNORMAL HIGH (ref 65–99)
Osmolality: 269 (ref 275–301)
POTASSIUM: 4.3 mmol/L (ref 3.5–5.1)
Sodium: 132 mmol/L — ABNORMAL LOW (ref 136–145)

## 2014-05-11 LAB — PROTIME-INR
INR: 1.2
Prothrombin Time: 14.9 secs — ABNORMAL HIGH (ref 11.5–14.7)

## 2014-05-11 LAB — MAGNESIUM: Magnesium: 0.8 mg/dL — ABNORMAL LOW

## 2014-05-12 DIAGNOSIS — I5032 Chronic diastolic (congestive) heart failure: Secondary | ICD-10-CM | POA: Diagnosis not present

## 2014-05-12 DIAGNOSIS — I4891 Unspecified atrial fibrillation: Secondary | ICD-10-CM | POA: Diagnosis not present

## 2014-05-12 DIAGNOSIS — I4892 Unspecified atrial flutter: Secondary | ICD-10-CM | POA: Diagnosis not present

## 2014-05-12 LAB — MAGNESIUM: MAGNESIUM: 2 mg/dL

## 2014-05-13 DIAGNOSIS — I4892 Unspecified atrial flutter: Secondary | ICD-10-CM | POA: Diagnosis not present

## 2014-05-13 DIAGNOSIS — I5032 Chronic diastolic (congestive) heart failure: Secondary | ICD-10-CM | POA: Diagnosis not present

## 2014-05-13 DIAGNOSIS — I4891 Unspecified atrial fibrillation: Secondary | ICD-10-CM | POA: Diagnosis not present

## 2014-05-13 LAB — CBC WITH DIFFERENTIAL/PLATELET
BASOS ABS: 0 10*3/uL (ref 0.0–0.1)
Basophil %: 0.2 %
EOS PCT: 0.2 %
Eosinophil #: 0 10*3/uL (ref 0.0–0.7)
HCT: 23.8 % — AB (ref 35.0–47.0)
HGB: 7.6 g/dL — AB (ref 12.0–16.0)
Lymphocyte #: 0.8 10*3/uL — ABNORMAL LOW (ref 1.0–3.6)
Lymphocyte %: 6 %
MCH: 29.6 pg (ref 26.0–34.0)
MCHC: 32.1 g/dL (ref 32.0–36.0)
MCV: 92 fL (ref 80–100)
MONOS PCT: 8 %
Monocyte #: 1 x10 3/mm — ABNORMAL HIGH (ref 0.2–0.9)
Neutrophil #: 11.1 10*3/uL — ABNORMAL HIGH (ref 1.4–6.5)
Neutrophil %: 85.6 %
PLATELETS: 282 10*3/uL (ref 150–440)
RBC: 2.58 10*6/uL — ABNORMAL LOW (ref 3.80–5.20)
RDW: 15.7 % — ABNORMAL HIGH (ref 11.5–14.5)
WBC: 12.9 10*3/uL — AB (ref 3.6–11.0)

## 2014-05-13 LAB — BASIC METABOLIC PANEL
Anion Gap: 7 (ref 7–16)
BUN: 22 mg/dL — ABNORMAL HIGH (ref 7–18)
Calcium, Total: 8.2 mg/dL — ABNORMAL LOW (ref 8.5–10.1)
Chloride: 90 mmol/L — ABNORMAL LOW (ref 98–107)
Co2: 34 mmol/L — ABNORMAL HIGH (ref 21–32)
Creatinine: 0.67 mg/dL (ref 0.60–1.30)
EGFR (Non-African Amer.): >60
Glucose: 136 mg/dL — ABNORMAL HIGH (ref 65–99)
OSMOLALITY: 268 (ref 275–301)
Potassium: 4 mmol/L (ref 3.5–5.1)
SODIUM: 131 mmol/L — AB (ref 136–145)

## 2014-05-13 LAB — PATHOLOGY REPORT

## 2014-05-14 DIAGNOSIS — I5032 Chronic diastolic (congestive) heart failure: Secondary | ICD-10-CM | POA: Diagnosis not present

## 2014-05-14 DIAGNOSIS — I4891 Unspecified atrial fibrillation: Secondary | ICD-10-CM | POA: Diagnosis not present

## 2014-05-14 DIAGNOSIS — I4892 Unspecified atrial flutter: Secondary | ICD-10-CM | POA: Diagnosis not present

## 2014-05-14 LAB — CBC WITH DIFFERENTIAL/PLATELET
BANDS NEUTROPHIL: 6 %
Comment - H1-Com1: NORMAL
Comment - H1-Com2: NORMAL
Eosinophil: 3 %
HCT: 26.9 % — ABNORMAL LOW (ref 35.0–47.0)
HGB: 8.8 g/dL — ABNORMAL LOW (ref 12.0–16.0)
Lymphocytes: 13 %
MCH: 29.8 pg (ref 26.0–34.0)
MCHC: 32.6 g/dL (ref 32.0–36.0)
MCV: 91 fL (ref 80–100)
Metamyelocyte: 1 %
Monocytes: 6 %
Platelet: 352 10*3/uL (ref 150–440)
RBC: 2.95 10*6/uL — AB (ref 3.80–5.20)
RDW: 15.5 % — ABNORMAL HIGH (ref 11.5–14.5)
Segmented Neutrophils: 71 %
WBC: 13 10*3/uL — AB (ref 3.6–11.0)

## 2014-05-14 LAB — BASIC METABOLIC PANEL
Anion Gap: 5 — ABNORMAL LOW (ref 7–16)
BUN: 22 mg/dL — AB (ref 7–18)
CALCIUM: 8.3 mg/dL — AB (ref 8.5–10.1)
CREATININE: 0.73 mg/dL (ref 0.60–1.30)
Chloride: 90 mmol/L — ABNORMAL LOW (ref 98–107)
Co2: 35 mmol/L — ABNORMAL HIGH (ref 21–32)
EGFR (African American): >60
EGFR (Non-African Amer.): >60
Glucose: 134 mg/dL — ABNORMAL HIGH (ref 65–99)
Osmolality: 266 (ref 275–301)
POTASSIUM: 3.9 mmol/L (ref 3.5–5.1)
Sodium: 130 mmol/L — ABNORMAL LOW (ref 136–145)

## 2014-05-14 LAB — MAGNESIUM: Magnesium: 1.9 mg/dL

## 2014-05-15 DIAGNOSIS — I5032 Chronic diastolic (congestive) heart failure: Secondary | ICD-10-CM | POA: Diagnosis not present

## 2014-05-15 DIAGNOSIS — I4892 Unspecified atrial flutter: Secondary | ICD-10-CM | POA: Diagnosis not present

## 2014-05-15 DIAGNOSIS — I4891 Unspecified atrial fibrillation: Secondary | ICD-10-CM | POA: Diagnosis not present

## 2014-05-15 LAB — BASIC METABOLIC PANEL
ANION GAP: 5 — AB (ref 7–16)
BUN: 13 mg/dL (ref 7–18)
CALCIUM: 8.5 mg/dL (ref 8.5–10.1)
CHLORIDE: 94 mmol/L — AB (ref 98–107)
Co2: 36 mmol/L — ABNORMAL HIGH (ref 21–32)
Creatinine: 0.66 mg/dL (ref 0.60–1.30)
EGFR (African American): >60
EGFR (Non-African Amer.): >60
GLUCOSE: 144 mg/dL — AB (ref 65–99)
Osmolality: 273 (ref 275–301)
Potassium: 4.3 mmol/L (ref 3.5–5.1)
Sodium: 135 mmol/L — ABNORMAL LOW (ref 136–145)

## 2014-05-15 LAB — CBC WITH DIFFERENTIAL/PLATELET
Basophil #: 0 10*3/uL (ref 0.0–0.1)
Basophil %: 0.1 %
EOS PCT: 1.7 %
Eosinophil #: 0.2 10*3/uL (ref 0.0–0.7)
HCT: 27.1 % — ABNORMAL LOW (ref 35.0–47.0)
HGB: 8.9 g/dL — ABNORMAL LOW (ref 12.0–16.0)
Lymphocyte #: 0.7 10*3/uL — ABNORMAL LOW (ref 1.0–3.6)
Lymphocyte %: 6.5 %
MCH: 29.9 pg (ref 26.0–34.0)
MCHC: 32.9 g/dL (ref 32.0–36.0)
MCV: 91 fL (ref 80–100)
MONO ABS: 0.6 x10 3/mm (ref 0.2–0.9)
MONOS PCT: 6 %
NEUTROS ABS: 9.1 10*3/uL — AB (ref 1.4–6.5)
Neutrophil %: 85.7 %
PLATELETS: 423 10*3/uL (ref 150–440)
RBC: 2.98 10*6/uL — AB (ref 3.80–5.20)
RDW: 15.3 % — ABNORMAL HIGH (ref 11.5–14.5)
WBC: 10.6 10*3/uL (ref 3.6–11.0)

## 2014-05-16 DIAGNOSIS — I4891 Unspecified atrial fibrillation: Secondary | ICD-10-CM | POA: Diagnosis not present

## 2014-05-16 DIAGNOSIS — I4892 Unspecified atrial flutter: Secondary | ICD-10-CM | POA: Diagnosis not present

## 2014-05-16 DIAGNOSIS — I5032 Chronic diastolic (congestive) heart failure: Secondary | ICD-10-CM | POA: Diagnosis not present

## 2014-05-16 LAB — BASIC METABOLIC PANEL
Anion Gap: 4 — ABNORMAL LOW (ref 7–16)
BUN: 10 mg/dL (ref 7–18)
CO2: 37 mmol/L — AB (ref 21–32)
CREATININE: 0.6 mg/dL (ref 0.60–1.30)
Calcium, Total: 8.5 mg/dL (ref 8.5–10.1)
Chloride: 90 mmol/L — ABNORMAL LOW (ref 98–107)
EGFR (African American): >60
Glucose: 150 mg/dL — ABNORMAL HIGH (ref 65–99)
Osmolality: 265 (ref 275–301)
Potassium: 4.1 mmol/L (ref 3.5–5.1)
SODIUM: 131 mmol/L — AB (ref 136–145)

## 2014-05-16 LAB — CBC WITH DIFFERENTIAL/PLATELET
BASOS ABS: 0.1 10*3/uL (ref 0.0–0.1)
BASOS PCT: 0.6 %
Eosinophil #: 0.4 10*3/uL (ref 0.0–0.7)
Eosinophil %: 2.8 %
HCT: 28 % — ABNORMAL LOW (ref 35.0–47.0)
HGB: 9.1 g/dL — AB (ref 12.0–16.0)
Lymphocyte #: 0.9 10*3/uL — ABNORMAL LOW (ref 1.0–3.6)
Lymphocyte %: 6.6 %
MCH: 29.9 pg (ref 26.0–34.0)
MCHC: 32.5 g/dL (ref 32.0–36.0)
MCV: 92 fL (ref 80–100)
MONO ABS: 0.9 x10 3/mm (ref 0.2–0.9)
MONOS PCT: 6.7 %
Neutrophil #: 11.2 10*3/uL — ABNORMAL HIGH (ref 1.4–6.5)
Neutrophil %: 83.3 %
Platelet: 544 10*3/uL — ABNORMAL HIGH (ref 150–440)
RBC: 3.05 10*6/uL — ABNORMAL LOW (ref 3.80–5.20)
RDW: 15.5 % — ABNORMAL HIGH (ref 11.5–14.5)
WBC: 13.4 10*3/uL — AB (ref 3.6–11.0)

## 2014-05-17 DIAGNOSIS — I4892 Unspecified atrial flutter: Secondary | ICD-10-CM | POA: Diagnosis not present

## 2014-05-17 DIAGNOSIS — I5032 Chronic diastolic (congestive) heart failure: Secondary | ICD-10-CM | POA: Diagnosis not present

## 2014-05-17 DIAGNOSIS — I4891 Unspecified atrial fibrillation: Secondary | ICD-10-CM | POA: Diagnosis not present

## 2014-05-17 LAB — CBC WITH DIFFERENTIAL/PLATELET
Basophil #: 0.1 10*3/uL (ref 0.0–0.1)
Basophil %: 0.4 %
EOS PCT: 3.1 %
Eosinophil #: 0.4 10*3/uL (ref 0.0–0.7)
HCT: 29.7 % — AB (ref 35.0–47.0)
HGB: 9.5 g/dL — ABNORMAL LOW (ref 12.0–16.0)
LYMPHS ABS: 1 10*3/uL (ref 1.0–3.6)
Lymphocyte %: 7.1 %
MCH: 29.6 pg (ref 26.0–34.0)
MCHC: 32.1 g/dL (ref 32.0–36.0)
MCV: 92 fL (ref 80–100)
Monocyte #: 0.9 x10 3/mm (ref 0.2–0.9)
Monocyte %: 6.3 %
Neutrophil #: 11.9 10*3/uL — ABNORMAL HIGH (ref 1.4–6.5)
Neutrophil %: 83.1 %
PLATELETS: 599 10*3/uL — AB (ref 150–440)
RBC: 3.22 10*6/uL — ABNORMAL LOW (ref 3.80–5.20)
RDW: 15.4 % — AB (ref 11.5–14.5)
WBC: 14.3 10*3/uL — ABNORMAL HIGH (ref 3.6–11.0)

## 2014-05-17 LAB — BASIC METABOLIC PANEL
Anion Gap: 5 — ABNORMAL LOW (ref 7–16)
BUN: 13 mg/dL (ref 7–18)
CALCIUM: 8.8 mg/dL (ref 8.5–10.1)
CHLORIDE: 89 mmol/L — AB (ref 98–107)
CO2: 37 mmol/L — AB (ref 21–32)
CREATININE: 0.63 mg/dL (ref 0.60–1.30)
EGFR (African American): >60
EGFR (Non-African Amer.): >60
Glucose: 136 mg/dL — ABNORMAL HIGH (ref 65–99)
OSMOLALITY: 265 (ref 275–301)
Potassium: 4.4 mmol/L (ref 3.5–5.1)
Sodium: 131 mmol/L — ABNORMAL LOW (ref 136–145)

## 2014-05-18 ENCOUNTER — Encounter: Payer: Self-pay | Admitting: Internal Medicine

## 2014-05-18 ENCOUNTER — Ambulatory Visit: Payer: Self-pay | Admitting: Hematology and Oncology

## 2014-05-19 ENCOUNTER — Telehealth: Payer: Self-pay

## 2014-05-19 ENCOUNTER — Telehealth: Payer: Self-pay | Admitting: *Deleted

## 2014-05-19 NOTE — Telephone Encounter (Signed)
Faxed orders to new #.

## 2014-05-19 NOTE — Telephone Encounter (Signed)
Pt daughter , Dr. Burnadette PopLinthavong released pt from hospital, [facility],  states [facility], states BP is 84/56. Please call.

## 2014-05-19 NOTE — Telephone Encounter (Signed)
Spoke w/ Gavin Poundeborah.  She states that pt's BP is low at 84/56 on amiodarone 84/56. She feels that pt was discharged from [facility] too early.  Reports that [facility] cannot treat her her low BP, but will send her back to the hospital.  She states that they have had a "run in" w/ Dr. Burnadette PopLinthavong and that he is no longer pt's PCP.  Spoke w/ Dr. Mariah MillingGollan.  He suspects that pt's sx may be r/t pain meds or diuretics.  Advised that pt: 1.  Hold bumex until specified otherwise 2.  Hold diltiazem for systolic pressures <110 mm/Hg 3.  Hold hydralazine for systolic pressures <110 mm/Hg 4.  Decrease amiodarone to 200mg  PO BID  F[facility] to [facility] [phone].

## 2014-05-19 NOTE — Telephone Encounter (Signed)
Pt daughter called and states her mother has been moved to a different room, the # we should call  [place] [phone]/ [phone], fax

## 2014-05-19 NOTE — Telephone Encounter (Signed)
Myriam JacobsonHelen Hedding moved to another room to 217

## 2014-05-19 NOTE — Telephone Encounter (Signed)
Patient's daughter called back and now is currently on amiodarone 400 mg

## 2014-05-20 LAB — CBC WITH DIFFERENTIAL/PLATELET
BASOS ABS: 0 10*3/uL (ref 0.0–0.1)
BASOS PCT: 0.4 %
EOS PCT: 2.6 %
Eosinophil #: 0.2 10*3/uL (ref 0.0–0.7)
HCT: 27 % — ABNORMAL LOW (ref 35.0–47.0)
HGB: 8.9 g/dL — AB (ref 12.0–16.0)
Lymphocyte #: 0.9 10*3/uL — ABNORMAL LOW (ref 1.0–3.6)
Lymphocyte %: 10 %
MCH: 30.2 pg (ref 26.0–34.0)
MCHC: 33.1 g/dL (ref 32.0–36.0)
MCV: 91 fL (ref 80–100)
MONO ABS: 0.7 x10 3/mm (ref 0.2–0.9)
Monocyte %: 7.8 %
NEUTROS ABS: 7.4 10*3/uL — AB (ref 1.4–6.5)
NEUTROS PCT: 79.2 %
PLATELETS: 528 10*3/uL — AB (ref 150–440)
RBC: 2.95 10*6/uL — ABNORMAL LOW (ref 3.80–5.20)
RDW: 15.6 % — ABNORMAL HIGH (ref 11.5–14.5)
WBC: 9.2 10*3/uL (ref 3.6–11.0)

## 2014-05-20 LAB — BASIC METABOLIC PANEL
ANION GAP: 5 — AB (ref 7–16)
BUN: 14 mg/dL (ref 7–18)
CREATININE: 0.67 mg/dL (ref 0.60–1.30)
Calcium, Total: 8.4 mg/dL — ABNORMAL LOW (ref 8.5–10.1)
Chloride: 92 mmol/L — ABNORMAL LOW (ref 98–107)
Co2: 37 mmol/L — ABNORMAL HIGH (ref 21–32)
EGFR (African American): >60
Glucose: 125 mg/dL — ABNORMAL HIGH (ref 65–99)
Osmolality: 270 (ref 275–301)
POTASSIUM: 3.8 mmol/L (ref 3.5–5.1)
Sodium: 134 mmol/L — ABNORMAL LOW (ref 136–145)

## 2014-05-22 ENCOUNTER — Encounter: Payer: Self-pay | Admitting: Internal Medicine

## 2014-05-25 ENCOUNTER — Telehealth: Payer: Self-pay

## 2014-05-25 NOTE — Telephone Encounter (Signed)
Spoke w/ Mariel CraftIreeka, pt's nurse [facility].  She reports that pt is doing well, eating and drinking.  Reports pt is now concerned that she is too heavy, as one of her doctors told her not to gain any wt around her hips and she is convinced that she needs to lose 5 lbs.  She has been requesting 1/2 portions of her meals in an effort to do lose wt.  BP 138/74, 133/68, 111/56. 113/69.

## 2014-05-27 ENCOUNTER — Telehealth: Payer: Self-pay

## 2014-05-27 LAB — CBC WITH DIFFERENTIAL/PLATELET
BASOS ABS: 0.1 10*3/uL (ref 0.0–0.1)
Basophil %: 1.2 %
EOS PCT: 3.5 %
Eosinophil #: 0.2 10*3/uL (ref 0.0–0.7)
HCT: 25.2 % — AB (ref 35.0–47.0)
HGB: 8.1 g/dL — ABNORMAL LOW (ref 12.0–16.0)
LYMPHS ABS: 1 10*3/uL (ref 1.0–3.6)
LYMPHS PCT: 20.5 %
MCH: 29.9 pg (ref 26.0–34.0)
MCHC: 32.1 g/dL (ref 32.0–36.0)
MCV: 93 fL (ref 80–100)
Monocyte #: 0.4 x10 3/mm (ref 0.2–0.9)
Monocyte %: 8.8 %
Neutrophil #: 3.3 10*3/uL (ref 1.4–6.5)
Neutrophil %: 66 %
Platelet: 405 10*3/uL (ref 150–440)
RBC: 2.71 10*6/uL — AB (ref 3.80–5.20)
RDW: 16.4 % — AB (ref 11.5–14.5)
WBC: 5.1 10*3/uL (ref 3.6–11.0)

## 2014-05-27 LAB — BASIC METABOLIC PANEL
Anion Gap: 5 — ABNORMAL LOW (ref 7–16)
BUN: 11 mg/dL (ref 7–18)
CALCIUM: 8.2 mg/dL — AB (ref 8.5–10.1)
CHLORIDE: 94 mmol/L — AB (ref 98–107)
CREATININE: 0.69 mg/dL (ref 0.60–1.30)
Co2: 34 mmol/L — ABNORMAL HIGH (ref 21–32)
EGFR (African American): >60
EGFR (Non-African Amer.): >60
Glucose: 115 mg/dL — ABNORMAL HIGH (ref 65–99)
Osmolality: 267 (ref 275–301)
Potassium: 4.1 mmol/L (ref 3.5–5.1)
SODIUM: 133 mmol/L — AB (ref 136–145)

## 2014-05-27 LAB — OCCULT BLOOD X 1 CARD TO LAB, STOOL: OCCULT BLOOD, FECES: NEGATIVE

## 2014-05-27 NOTE — Telephone Encounter (Signed)
Spoke w/ Gavin Poundeborah.  She reports that she does not want to hold pt's meds, as she reports that BP goes up in the afternoon.  She does not want to restart spironolactone right now, as pt was just restarted on Bumex yesterday.  Harrington Challengerdvised Deborah that pt needs an appt to come in to discuss w/ Dr. Mariah MillingGollan, but he does not have an opening until 8/17, so she will just keep appt on 8/21.  She would like to "have this taken care of over the phone".  Please advise.  Thank you.

## 2014-05-27 NOTE — Telephone Encounter (Signed)
[facility] daughter [facility] not giving pt her fluid pill since she left the hospital . States they have started her on the Bumex this morning." This am her BP was 113, HR is 92" Pt would like to talk with a nurse. States she is swelling.

## 2014-05-27 NOTE — Telephone Encounter (Signed)
Hold hydralazine for now Hold spironolactone for now Restart spironolactone 25 mg daily

## 2014-05-27 NOTE — Telephone Encounter (Signed)
Sounds like she does not want to make any changes. That is fine, we can monitor for now

## 2014-05-27 NOTE — Telephone Encounter (Signed)
Spoke w/ Gavin Poundeborah.  She reports that she is upset w/ the way her mother was treated by a nurse while in [facility] and is in the process of lodging a complaint.  [facility] was given orders by our office on 05/19/14 to hold Bumex until otherwise specified. Advised her that I called to check on pt on 8/4 and was told that pt is doing well and her BP has been good, but pt is concerned about her wt, but did not c/o swelling.  Dr. Dareen PianoAnderson restarted Bumex 1 mg yesterday, as pt is having leg edema.  She reports that pt has not received her spironolactone since being discharged [facility]. Pt was advised by our office to hold diltiazem and hydralazine if systolic <110, but daughter reports that when BP is around 120 systolic, pt's BP will drop to around 90/60 and pt is lethargic a few hours after receiving meds.  Reports that Dr. Dareen PianoAnderson has pt on amiodarone 200mg  BID and she is wondering if pt needs to be on this.  Pt is sched to see Dr. Mariah MillingGollan on 06/11/14.

## 2014-05-28 LAB — FOLATE: FOLIC ACID: 25.3 ng/mL (ref 3.1–100.0)

## 2014-05-28 LAB — BASIC METABOLIC PANEL
Anion Gap: 5 — ABNORMAL LOW (ref 7–16)
BUN: 11 mg/dL (ref 7–18)
CALCIUM: 8.5 mg/dL (ref 8.5–10.1)
CHLORIDE: 96 mmol/L — AB (ref 98–107)
CO2: 36 mmol/L — AB (ref 21–32)
CREATININE: 0.8 mg/dL (ref 0.60–1.30)
EGFR (Non-African Amer.): >60
Glucose: 109 mg/dL — ABNORMAL HIGH (ref 65–99)
OSMOLALITY: 274 (ref 275–301)
POTASSIUM: 4 mmol/L (ref 3.5–5.1)
SODIUM: 137 mmol/L (ref 136–145)

## 2014-05-28 LAB — IRON AND TIBC
IRON SATURATION: 13 %
IRON: 38 ug/dL — AB (ref 50–170)
Iron Bind.Cap.(Total): 289 ug/dL (ref 250–450)
Unbound Iron-Bind.Cap.: 251 ug/dL

## 2014-05-28 LAB — FERRITIN: Ferritin (ARMC): 134 ng/mL (ref 8–388)

## 2014-05-28 NOTE — Telephone Encounter (Signed)
Spoke w/ Gavin Poundeborah.  She reports that pt's spironolactone was restarted, but she is unsure when.  She reports that pt had a rough night, labs indicated a blood loss and pt was almost taken to ED.   Pt had repeat labs drawn this am.  She reports that pt c/o frequent nausea, but Gavin PoundDeborah believes this is due to pt taking all of her meds at the same time.  Gavin PoundDeborah states that she is unhappy w/ pt's current rehab facility, as she does not feel that her mother is getting the care that she needs.  They have an appt on Wed to go over her care and see when pt is able to go home.  She will monitor pt over the weekend and have facility fax results over to us on Monday.  Asked her to call if we can assist her further.

## 2014-06-01 ENCOUNTER — Telehealth: Payer: Self-pay

## 2014-06-01 ENCOUNTER — Other Ambulatory Visit: Payer: Self-pay

## 2014-06-01 LAB — BASIC METABOLIC PANEL
Anion Gap: 2 — ABNORMAL LOW (ref 7–16)
BUN: 12 mg/dL (ref 7–18)
CALCIUM: 7.9 mg/dL — AB (ref 8.5–10.1)
CHLORIDE: 94 mmol/L — AB (ref 98–107)
CO2: 36 mmol/L — AB (ref 21–32)
CREATININE: 0.73 mg/dL (ref 0.60–1.30)
EGFR (African American): >60
EGFR (Non-African Amer.): >60
Glucose: 120 mg/dL — ABNORMAL HIGH (ref 65–99)
Osmolality: 265 (ref 275–301)
POTASSIUM: 4.1 mmol/L (ref 3.5–5.1)
SODIUM: 132 mmol/L — AB (ref 136–145)

## 2014-06-01 LAB — CBC WITH DIFFERENTIAL/PLATELET
Basophil #: 0 10*3/uL (ref 0.0–0.1)
Basophil %: 0.8 %
EOS ABS: 0.1 10*3/uL (ref 0.0–0.7)
EOS PCT: 2.6 %
HCT: 27.5 % — ABNORMAL LOW (ref 35.0–47.0)
HGB: 8.6 g/dL — ABNORMAL LOW (ref 12.0–16.0)
Lymphocyte #: 0.9 10*3/uL — ABNORMAL LOW (ref 1.0–3.6)
Lymphocyte %: 18.9 %
MCH: 29.3 pg (ref 26.0–34.0)
MCHC: 31.4 g/dL — ABNORMAL LOW (ref 32.0–36.0)
MCV: 94 fL (ref 80–100)
MONOS PCT: 9.7 %
Monocyte #: 0.5 x10 3/mm (ref 0.2–0.9)
Neutrophil #: 3.3 10*3/uL (ref 1.4–6.5)
Neutrophil %: 68 %
Platelet: 317 10*3/uL (ref 150–440)
RBC: 2.94 10*6/uL — ABNORMAL LOW (ref 3.80–5.20)
RDW: 16.5 % — ABNORMAL HIGH (ref 11.5–14.5)
WBC: 4.8 10*3/uL (ref 3.6–11.0)

## 2014-06-01 LAB — OCCULT BLOOD X 1 CARD TO LAB, STOOL: OCCULT BLOOD, FECES: POSITIVE

## 2014-06-01 MED ORDER — AMIODARONE HCL 200 MG PO TABS: 200.0000 mg | ORAL_TABLET | Freq: Two times a day (BID) | ORAL | Status: DC

## 2014-06-01 NOTE — Telephone Encounter (Signed)
Pt's daughter faxed med list [facility].  Updated our list to include amiodarone that Dr. Dareen PianoAnderson restarted pt on.

## 2014-06-02 ENCOUNTER — Ambulatory Visit: Payer: Self-pay | Admitting: Hematology and Oncology

## 2014-06-02 LAB — COMPREHENSIVE METABOLIC PANEL

## 2014-06-02 LAB — TSH: THYROID STIMULATING HORM: 2.43 u[IU]/mL

## 2014-06-08 ENCOUNTER — Emergency Department: Payer: Self-pay | Admitting: Emergency Medicine

## 2014-06-08 ENCOUNTER — Telehealth: Payer: Self-pay

## 2014-06-08 LAB — CBC WITH DIFFERENTIAL/PLATELET
BASOS PCT: 0.7 %
Basophil #: 0.1 10*3/uL (ref 0.0–0.1)
EOS PCT: 2.2 %
Eosinophil #: 0.2 10*3/uL (ref 0.0–0.7)
HCT: 33.2 % — ABNORMAL LOW (ref 35.0–47.0)
HGB: 10.3 g/dL — ABNORMAL LOW (ref 12.0–16.0)
LYMPHS PCT: 11.3 %
Lymphocyte #: 0.9 10*3/uL — ABNORMAL LOW (ref 1.0–3.6)
MCH: 29.3 pg (ref 26.0–34.0)
MCHC: 30.9 g/dL — ABNORMAL LOW (ref 32.0–36.0)
MCV: 95 fL (ref 80–100)
Monocyte #: 0.8 x10 3/mm (ref 0.2–0.9)
Monocyte %: 9.9 %
NEUTROS ABS: 5.8 10*3/uL (ref 1.4–6.5)
Neutrophil %: 75.9 %
Platelet: 305 10*3/uL (ref 150–440)
RBC: 3.51 10*6/uL — ABNORMAL LOW (ref 3.80–5.20)
RDW: 16.9 % — AB (ref 11.5–14.5)
WBC: 7.7 10*3/uL (ref 3.6–11.0)

## 2014-06-08 LAB — BASIC METABOLIC PANEL
ANION GAP: 6 — AB (ref 7–16)
Anion Gap: 5 — ABNORMAL LOW (ref 7–16)
BUN: 15 mg/dL (ref 7–18)
BUN: 15 mg/dL (ref 7–18)
CHLORIDE: 91 mmol/L — AB (ref 98–107)
CREATININE: 0.66 mg/dL (ref 0.60–1.30)
Calcium, Total: 8.7 mg/dL (ref 8.5–10.1)
Calcium, Total: 8.4 mg/dL — ABNORMAL LOW (ref 8.5–10.1)
Chloride: 91 mmol/L — ABNORMAL LOW (ref 98–107)
Co2: 35 mmol/L — ABNORMAL HIGH (ref 21–32)
Co2: 34 mmol/L — ABNORMAL HIGH (ref 21–32)
Creatinine: 0.72 mg/dL (ref 0.60–1.30)
EGFR (African American): >60
EGFR (Non-African Amer.): >60
Glucose: 110 mg/dL — ABNORMAL HIGH (ref 65–99)
Glucose: 92 mg/dL (ref 65–99)
Osmolality: 264 (ref 275–301)
Osmolality: 263 (ref 275–301)
POTASSIUM: 4.5 mmol/L (ref 3.5–5.1)
POTASSIUM: 4.6 mmol/L (ref 3.5–5.1)
SODIUM: 131 mmol/L — AB (ref 136–145)
Sodium: 131 mmol/L — ABNORMAL LOW (ref 136–145)

## 2014-06-08 LAB — TROPONIN I: Troponin-I: <0.02 ng/mL

## 2014-06-08 LAB — CBC
HCT: 32.3 % — ABNORMAL LOW (ref 35.0–47.0)
HGB: 10.2 g/dL — AB (ref 12.0–16.0)
MCH: 29.2 pg (ref 26.0–34.0)
MCHC: 31.5 g/dL — ABNORMAL LOW (ref 32.0–36.0)
MCV: 93 fL (ref 80–100)
Platelet: 285 10*3/uL (ref 150–440)
RBC: 3.48 10*6/uL — ABNORMAL LOW (ref 3.80–5.20)
RDW: 16.8 % — ABNORMAL HIGH (ref 11.5–14.5)
WBC: 7.9 10*3/uL (ref 3.6–11.0)

## 2014-06-08 LAB — PRO B NATRIURETIC PEPTIDE: B-Type Natriuretic Peptide: 1138 pg/mL — ABNORMAL HIGH (ref 0–450)

## 2014-06-08 NOTE — Telephone Encounter (Signed)
Pt's daughter called stating that [facility] and "they are trying to send her to the emergency room".  She reports that pt has nitro in her room, but facility does not have an order to give this to her.   Spoke w/ Danne HarborIreka, RN at [facility] who reports that pt c/o chest pain since last night, describes it as a tightness and squeezing w/ significant SOB that has not been relieved.  Pt is unable to participate in PT due to the pain. She states that they are unable to perform EKG in their facility and it is their protocol to call EMS for this.  Reports pt's Hgb is 8 and pt has not been taking her iron supplements since her last iron infusion.  Advised her to give pt nitro while EMS is en route, but I agree w/ her that pt needs to be evaluated.   Spoke w/ pt and and reassured her.   She is quite upset at the staff, as they are advising her that she cannot keep nitro in her room and she does not want the nurse going thru her things.  Asked pt and staff to call back if we can be of further assistance.

## 2014-06-09 ENCOUNTER — Telehealth: Payer: Self-pay | Admitting: *Deleted

## 2014-06-09 NOTE — Telephone Encounter (Signed)
Gavin PoundDeborah called to resched her mother's appt. She wanted to thank you for all your help yesterday. She really appreciates it!!

## 2014-06-11 ENCOUNTER — Ambulatory Visit: Payer: Medicare Other | Admitting: Cardiovascular Disease

## 2014-06-22 ENCOUNTER — Encounter: Payer: Self-pay | Admitting: Internal Medicine

## 2014-06-22 ENCOUNTER — Ambulatory Visit: Payer: Self-pay | Admitting: Hematology and Oncology

## 2014-06-29 ENCOUNTER — Telehealth: Payer: Self-pay

## 2014-06-29 NOTE — Telephone Encounter (Signed)
Left detailed message.  Asked Debbie to call back.

## 2014-06-29 NOTE — Telephone Encounter (Signed)
Spoke w/ pt.  She states that amiodarone is causing her to have trouble breathing and she has to wear her O2 while taking it.  Reports that she is going to the bathroom more, having trouble walking and feels tired.  She reports that she did not take it last night or this am and that she feels good.  Pt states that she is not taking any pain meds. Pt states that she knows it is the amiodarone and that "I know I'm not supposed to stop it, but it stays in your system for weeks, so I'm okay". Please advise.  Thank you.

## 2014-06-29 NOTE — Telephone Encounter (Signed)
Pt daughter called and would like to return call to be to her, instead of her mother.

## 2014-06-29 NOTE — Telephone Encounter (Signed)
As she probably nose, amiodarone is used for atrial fibrillation We used this to maintain a normal rhythm Hard to say what could be causing her shortness of breath, Is a possible she needs extra Bumex for fluid? Oxygen might be needed for her COPD COPD exacerbation might need prednisone?

## 2014-06-29 NOTE — Telephone Encounter (Signed)
Pt daughter called, states pt is having problems with Amiodarone . States since she got out of the hospital she has had problems breathing, and thinks this is from the Amiodarone. States she has not taken this this morning. Please call. If after 9, call pt cell phone.

## 2014-07-08 ENCOUNTER — Ambulatory Visit (INDEPENDENT_AMBULATORY_CARE_PROVIDER_SITE_OTHER): Payer: Medicare Other | Admitting: Cardiovascular Disease

## 2014-07-08 ENCOUNTER — Encounter: Payer: Self-pay | Admitting: Cardiovascular Disease

## 2014-07-08 VITALS — BP 110/64 | HR 67 | Ht 61.0 in | Wt 157.0 lb

## 2014-07-08 DIAGNOSIS — I10 Essential (primary) hypertension: Secondary | ICD-10-CM

## 2014-07-08 DIAGNOSIS — E119 Type 2 diabetes mellitus without complications: Secondary | ICD-10-CM

## 2014-07-08 DIAGNOSIS — R0602 Shortness of breath: Secondary | ICD-10-CM

## 2014-07-08 DIAGNOSIS — I509 Heart failure, unspecified: Secondary | ICD-10-CM

## 2014-07-08 DIAGNOSIS — I059 Rheumatic mitral valve disease, unspecified: Secondary | ICD-10-CM

## 2014-07-08 DIAGNOSIS — J449 Chronic obstructive pulmonary disease, unspecified: Secondary | ICD-10-CM

## 2014-07-08 DIAGNOSIS — I5032 Chronic diastolic (congestive) heart failure: Secondary | ICD-10-CM

## 2014-07-08 MED ORDER — NITROGLYCERIN 0.4 MG SL SUBL: 0.4000 mg | SUBLINGUAL_TABLET | SUBLINGUAL | Status: DC | PRN

## 2014-07-08 MED ORDER — AMIODARONE HCL 200 MG PO TABS: 100.0000 mg | ORAL_TABLET | Freq: Every day | ORAL | Status: DC

## 2014-07-08 MED ORDER — BUMETANIDE 1 MG PO TABS: ORAL_TABLET | ORAL | Status: DC

## 2014-07-08 NOTE — Progress Notes (Signed)
Patient ID: Catherine Holmes, female    DOB: Dec 14, 1927, 80 y.o.   MRN: [id number]  HPI Comments: 80 year-old woman with a history of DM,  peripheral vascular disease, 60 to 70% carotid arterial disease, moderate arterial disease of the lower extremities, status post bilateral iliac stenting, moderate renal stenosis, hyperlipidemia, history of severe  hypertension with labile blood pressures, history of episodic shortness of breath and chest squeezing relieved with albuterol/nebs who presents for routine followup. She has a sulfa allergy to loop diuretics. History of falls.   left carotid arterial stenosis estimated at 60-79% disease. She has had several admissions for diastolic CHF, typically from dietary indiscretion and periodic noncompliance with diuretics She was admitted on 11/02/2013 , discharged on 11/16/2013  admitted on 01/24/2014, discharge from 01/27/2014 for acute respiratory failure secondary to COPD History of anemia followed by hematology  In followup today, she reports that her blood pressure has been running high. Numbers provided shows systolic pressure 150-170. Weight has been relatively stable at 153 up to 154 pounds but she has pitting edema of her legs bilaterally She stopped her amiodarone out of concern for possible pulmonary complications. Denies having any palpitations She does report having rare episodes of near syncope wall in a sitting position. Comes across her like a wave it resolves relatively quickly She's not taking Bumex. She was seen by Dr. Cherylann Ratel today who recommended she restart Bumex for several days  She was in the hospital 05/05/2014 with hypertensive urgency, hyponatremia. Blood pressure is 201/98 and she was put back on her outpatient regiment with improvement of her symptoms. She suffered a fractured hip on 05/07/2014 and we were consulted for preop evaluation. Found to have elevated at her pressures at 65. She was discharged on 05/17/2014 EKGs from her  hospital course showed she had atrial fibrillation on 05/09/2014  Previous Echocardiogram in the hospital showed normal ejection fraction, moderate to severe, hypertension, moderately dilated left atrium  Has a history of large hiatal hernia She is not able to tolerate statins. She does handle zetia without significant side effects. previously encouraged to take red yeast rice.   Previous weight October 2014 was 148 pounds.  weight at the end of December 2014 was 160 pounds, she had increasing shortness of breath, had not been taking Bumex    in the hospital at [facility] in 2012 for general malaise, severe hypertension, shortness of breath, chest pain. She had a stress test, lexiscan that showed no significant ischemia. Significant shortness of breath with lexiscan.   hospital admission on 01/08/2012 for shortness of breath and syncope. She was found to have significant anemia. Treated with Lasix for diastolic CHF.  Underlying severe left carotid arterial disease estimated at 70%.   Prior followup with Dr. Welton Flakes in pulmonary with numerous CT scans, MRIs, PFTs, sleep study. She was told that she had hypoxemia overnight and was started on nighttime oxygen.    hospitalization 05/27/2013 for acute on chronic diastolic CHF. She was treated with IV Bumex twice a day with improvement of her symptoms. Getting factor was her anemia with hematocrit of 25.  Previous iron infusion. In the second week of September 2014 she fell outside a cracker barrel and hit her head. She has been slow to recover. She still has a knot on her head.  Echocardiogram in the hospital August 2014 shows normal ejection fraction, severely elevated right ventricular systolic pressures, mild to moderate TR  EKG shows normal sinus rhythm with  no significant ST or  T wave changes  Outpatient Encounter Prescriptions as of 07/08/2014  Medication Sig  . albuterol (PROAIR HFA) 108 (90 BASE) MCG/ACT inhaler Inhale 2 puffs into the lungs  every 6 (six) hours as needed.  Marland Kitchen albuterol (PROVENTIL) (2.5 MG/3ML) 0.083% nebulizer solution Take 2.5 mg by nebulization every 6 (six) hours as needed.    Marland Kitchen arformoterol (BROVANA) 15 MCG/2ML NEBU Take 2 mLs (15 mcg total) by nebulization 2 (two) times daily.  Marland Kitchen aspirin 81 MG tablet Take 81 mg by mouth daily.    . beta carotene w/minerals (OCUVITE) tablet Take 1 tablet by mouth 2 (two) times daily.  . bumetanide (BUMEX) 1 MG tablet Take 2 tabs BID  . Cholecalciferol (VITAMIN D-3 PO) Take 1,000 Units by mouth.  . citalopram (CELEXA) 20 MG tablet Take 20 mg by mouth daily.  Marland Kitchen dexlansoprazole (DEXILANT) 60 MG capsule Take 60 mg by mouth daily as needed.  . diphenhydramine-acetaminophen (TYLENOL PM) 25-500 MG TABS Take 1 tablet by mouth at bedtime as needed.  . doxazosin (CARDURA) 4 MG tablet Take 4 mg by mouth at bedtime.   Marland Kitchen ezetimibe (ZETIA) 10 MG tablet Take 1 tablet (10 mg total) by mouth daily.  Marland Kitchen gabapentin (NEURONTIN) 100 MG capsule Take 100 mg by mouth 2 (two) times daily.   . hydrALAZINE (APRESOLINE) 50 MG tablet Take 50 mg by mouth 2 (two) times daily.  Marland Kitchen HYDROcodone-acetaminophen (NORCO/VICODIN) 5-325 MG per tablet Take 1 tablet by mouth as needed.   . metFORMIN (GLUCOPHAGE) 500 MG tablet Take 500 mg by mouth daily as needed.   . mometasone-formoterol (DULERA) 100-5 MCG/ACT AERO Inhale 2 puffs into the lungs.  . Multiple Vitamin (MULTIVITAMIN) tablet Take 1 tablet by mouth daily.    . nebivolol (BYSTOLIC) 5 MG tablet Take 5 mg by mouth daily.  . nitroGLYCERIN (NITROSTAT) 0.4 MG SL tablet Place 1 tablet (0.4 mg total) under the tongue every 5 (five) minutes as needed for chest pain.  . potassium chloride (KLOR-CON 10) 10 MEQ tablet TAKE 1/2 TABLET BY MOUTH TWICE A DAY AS NEEDED WITH BUMEX  . spironolactone (ALDACTONE) 25 MG tablet Take 25 mg by mouth daily.  Marland Kitchen tiotropium (SPIRIVA) 18 MCG inhalation capsule Place 1 capsule (18 mcg total) into inhaler and inhale daily.  Marland Kitchen triamcinolone  (KENALOG) 0.025 % cream Apply 1 application topically as needed.     Review of Systems  HENT: Negative.   Eyes: Negative.   Respiratory: Positive for shortness of breath.   Cardiovascular: Positive for leg swelling.  Gastrointestinal: Negative.   Endocrine: Negative.   Musculoskeletal: Positive for arthralgias, gait problem and myalgias.  Skin: Negative.   Allergic/Immunologic: Negative.   Neurological: Negative.   Hematological: Negative.   Psychiatric/Behavioral: Negative.   All other systems reviewed and are negative.   BP 110/64  Pulse 67  Ht  (1.549 m)  Wt 157 lb (71.215 kg)  BMI 29.68 kg/m2  Physical Exam  Nursing note and vitals reviewed. Constitutional: She is oriented to person, place, and time. She appears well-developed and well-nourished.  HENT:  Head: Normocephalic.  Nose: Nose normal.  Mouth/Throat: Oropharynx is clear and moist.  Eyes: Conjunctivae are normal. Pupils are equal, round, and reactive to light.  Neck: Normal range of motion. Neck supple. No JVD present. Carotid bruit is present.  Cardiovascular: Normal rate, regular rhythm, S1 normal, S2 normal and intact distal pulses.  Exam reveals no gallop and no friction rub.   Murmur heard.  Crescendo systolic murmur is present  with a grade of 2/6  1+ pitting edema to below the knees bilaterally  Pulmonary/Chest: Effort normal. No respiratory distress. She has decreased breath sounds. She has no wheezes. She has rales. She exhibits no tenderness.  Abdominal: Soft. Bowel sounds are normal. She exhibits no distension. There is no tenderness.  Musculoskeletal: Normal range of motion. She exhibits no edema and no tenderness.  Lymphadenopathy:    She has no cervical adenopathy.  Neurological: She is alert and oriented to person, place, and time. Coordination normal.  Skin: Skin is warm and dry. No rash noted. No erythema.  Psychiatric: She has a normal mood and affect. Her behavior is normal. Judgment and  thought content normal.    Assessment and Plan

## 2014-07-08 NOTE — Assessment & Plan Note (Signed)
COPD has been relatively stable.

## 2014-07-08 NOTE — Assessment & Plan Note (Addendum)
We have recommended she start her Bumex 1 mg daily, twice a day dosing force worsening shortness of breath and leg edema. She could hold the Bumex once her edema has resolved. She will take this with potassium

## 2014-07-08 NOTE — Assessment & Plan Note (Addendum)
Hospital records reviewed from July 2015. At that time blood pressure is very high. Blood pressures running high, likely secondary to acute on chronic diastolic CHF. Recommended she stay with her Bumex for now and monitor her blood pressure, cholecystectomy get a continues to run high

## 2014-07-08 NOTE — Assessment & Plan Note (Signed)
Sugars have been running 90-110 over the past 2 weeks

## 2014-07-08 NOTE — Assessment & Plan Note (Signed)
Reason shortness of breath likely from diastolic CHF. Recommended she get back on her Bumex

## 2014-07-08 NOTE — Patient Instructions (Signed)
You are doing well. No medication changes were made. Please restart bumex as needed for leg swelling  Please call us if you have new issues that need to be addressed before your next appt.  Your physician wants you to follow-up in: 1 month.

## 2014-08-12 ENCOUNTER — Ambulatory Visit (INDEPENDENT_AMBULATORY_CARE_PROVIDER_SITE_OTHER): Payer: Medicare Other | Admitting: Cardiovascular Disease

## 2014-08-12 ENCOUNTER — Ambulatory Visit: Payer: Self-pay | Admitting: Internal Medicine

## 2014-08-12 ENCOUNTER — Encounter: Payer: Self-pay | Admitting: Cardiovascular Disease

## 2014-08-12 VITALS — BP 120/70 | HR 63 | Ht 61.0 in | Wt 160.0 lb

## 2014-08-12 DIAGNOSIS — I5032 Chronic diastolic (congestive) heart failure: Secondary | ICD-10-CM

## 2014-08-12 DIAGNOSIS — I739 Peripheral vascular disease, unspecified: Secondary | ICD-10-CM

## 2014-08-12 DIAGNOSIS — J449 Chronic obstructive pulmonary disease, unspecified: Secondary | ICD-10-CM

## 2014-08-12 DIAGNOSIS — E785 Hyperlipidemia, unspecified: Secondary | ICD-10-CM

## 2014-08-12 DIAGNOSIS — R079 Chest pain, unspecified: Secondary | ICD-10-CM

## 2014-08-12 DIAGNOSIS — R0602 Shortness of breath: Secondary | ICD-10-CM

## 2014-08-12 DIAGNOSIS — I10 Essential (primary) hypertension: Secondary | ICD-10-CM

## 2014-08-12 LAB — CBC CANCER CENTER
Basophil #: 0 x10 3/mm (ref 0.0–0.1)
Basophil %: 0.4 %
EOS PCT: 1.2 %
Eosinophil #: 0.1 x10 3/mm (ref 0.0–0.7)
HCT: 36.5 % (ref 35.0–47.0)
HGB: 12 g/dL (ref 12.0–16.0)
LYMPHS ABS: 1.1 x10 3/mm (ref 1.0–3.6)
Lymphocyte %: 14.6 %
MCH: 30.3 pg (ref 26.0–34.0)
MCHC: 32.9 g/dL (ref 32.0–36.0)
MCV: 92 fL (ref 80–100)
MONO ABS: 0.6 x10 3/mm (ref 0.2–0.9)
MONOS PCT: 7.5 %
NEUTROS PCT: 76.3 %
Neutrophil #: 5.9 x10 3/mm (ref 1.4–6.5)
Platelet: 283 x10 3/mm (ref 150–440)
RBC: 3.97 10*6/uL (ref 3.80–5.20)
RDW: 15.2 % — AB (ref 11.5–14.5)
WBC: 7.7 x10 3/mm (ref 3.6–11.0)

## 2014-08-12 LAB — IRON AND TIBC
IRON SATURATION: 17 %
Iron Bind.Cap.(Total): 323 ug/dL (ref 250–450)
Iron: 54 ug/dL (ref 50–170)
Unbound Iron-Bind.Cap.: 269 ug/dL

## 2014-08-12 LAB — FERRITIN: Ferritin (ARMC): 56 ng/mL (ref 8–388)

## 2014-08-12 NOTE — Assessment & Plan Note (Addendum)
Underlying carotid arterial disease. Unable to tolerate a statin Repeat ultrasound in 2016 Prior ultrasound estimated at 70% carotid disease

## 2014-08-12 NOTE — Assessment & Plan Note (Addendum)
Blood pressure ranging 120 up to 140 systolic on today's visit. No medication  changes made

## 2014-08-12 NOTE — Assessment & Plan Note (Signed)
Continue zetia. She does not want a statin.

## 2014-08-12 NOTE — Assessment & Plan Note (Addendum)
Encouraged her to restart her Bumex daily given her worsening lower extremity edema Weight is 6 pounds above her baseline

## 2014-08-12 NOTE — Progress Notes (Signed)
Patient ID: Catherine AmassHelen K Holmes, female    DOB: [birth year], 80 y.o.   MRN: [id number]  HPI Comments: 80 year-old woman with a history of DM,  peripheral vascular disease, 60 to 70% carotid arterial disease, moderate arterial disease of the lower extremities, status post bilateral iliac stenting, moderate renal stenosis, hyperlipidemia, history of severe  hypertension with labile blood pressures, history of episodic shortness of breath and chest squeezing relieved with albuterol/nebs who presents for routine followup. She has a sulfa allergy to loop diuretics. History of falls.   left carotid arterial stenosis estimated at 60-79% disease. She has had several admissions for diastolic CHF, typically from dietary indiscretion and periodic noncompliance with diuretics She was admitted on 11/02/2013 , discharged on 11/16/2013  admitted on 01/24/2014, discharge from 01/27/2014 for acute respiratory failure secondary to COPD History of anemia followed by hematology  In followup today, she reports that she had hip surgery in July 2015. She continues to recover, is moving slowly. Blood pressure typically 160 systolic at home. In the office today is 120 up to 140 systolic Some lower extremity swelling. She has not been taking her Bumex consistently. Denies having significant shortness of breath, possibly some mild chest congestion Weight is above her baseline. Typical weight 154 pounds, now 160 Followed by Dr. Cherylann RatelLateef for her kidney issues  She stopped her amiodarone out of concern for possible pulmonary complications. Denies having any palpitations She does report having rare episodes of near syncope wall in a sitting position. Comes across her like a wave it resolves relatively quickly  She was in the hospital 05/05/2014 with hypertensive urgency, hyponatremia. Blood pressure is 201/98 and she was put back on her outpatient regiment with improvement of her symptoms. She suffered a fractured hip on 05/07/2014  and we were consulted for preop evaluation. Found to have elevated at her pressures at 65. She was discharged on 05/17/2014 EKGs from her hospital course showed she had atrial fibrillation on 05/09/2014  Previous Echocardiogram in the hospital showed normal ejection fraction, moderate to severe, hypertension, moderately dilated left atrium  Has a history of large hiatal hernia She is not able to tolerate statins. She does handle zetia without significant side effects. previously encouraged to take red yeast rice.   Previous weight October 2014 was 148 pounds.  weight at the end of December 2014 was 160 pounds, she had increasing shortness of breath, had not been taking Bumex    in the hospital at [facility] in 2012 for general malaise, severe hypertension, shortness of breath, chest pain. She had a stress test, lexiscan that showed no significant ischemia. Significant shortness of breath with lexiscan.   hospital admission on 01/08/2012 for shortness of breath and syncope. She was found to have significant anemia. Treated with Lasix for diastolic CHF.  Underlying severe left carotid arterial disease estimated at 70%.   Prior followup with Dr. Welton FlakesKhan in pulmonary with numerous CT scans, MRIs, PFTs, sleep study. She was told that she had hypoxemia overnight and was started on nighttime oxygen.    hospitalization 05/27/2013 for acute on chronic diastolic CHF. She was treated with IV Bumex twice a day with improvement of her symptoms. Getting factor was her anemia with hematocrit of 25.  Previous iron infusion. In the second week of September 2014 she fell outside a cracker barrel and hit her head. She has been slow to recover. She still has a knot on her head.  Echocardiogram in the hospital August 2014 shows normal ejection fraction,  severely elevated right ventricular systolic pressures, mild to moderate TR  EKG shows normal sinus rhythm with  no significant ST or T wave changes  Outpatient  Encounter Prescriptions as of 08/12/2014  Medication Sig  . albuterol (PROAIR HFA) 108 (90 BASE) MCG/ACT inhaler Inhale 2 puffs into the lungs every 6 (six) hours as needed.  Marland Kitchen albuterol (PROVENTIL) (2.5 MG/3ML) 0.083% nebulizer solution Take 2.5 mg by nebulization every 6 (six) hours as needed.    Marland Kitchen arformoterol (BROVANA) 15 MCG/2ML NEBU Take 2 mLs (15 mcg total) by nebulization 2 (two) times daily.  Marland Kitchen aspirin 81 MG tablet Take 81 mg by mouth daily.    . beta carotene w/minerals (OCUVITE) tablet Take 1 tablet by mouth 2 (two) times daily.  . bumetanide (BUMEX) 1 MG tablet Take 1 mg by mouth 2 (two) times daily as needed. Take 1 tab BID PRN  . Cholecalciferol (VITAMIN D-3 PO) Take 1,000 Units by mouth.  . citalopram (CELEXA) 20 MG tablet Take 20 mg by mouth daily.  Marland Kitchen dexlansoprazole (DEXILANT) 60 MG capsule Take 60 mg by mouth daily as needed.  . diphenhydramine-acetaminophen (TYLENOL PM) 25-500 MG TABS Take 1 tablet by mouth at bedtime as needed.  . doxazosin (CARDURA) 4 MG tablet Take 4 mg by mouth at bedtime.   Marland Kitchen ezetimibe (ZETIA) 10 MG tablet Take 1 tablet (10 mg total) by mouth daily.  Marland Kitchen gabapentin (NEURONTIN) 100 MG capsule Take 100 mg by mouth 2 (two) times daily.   . hydrALAZINE (APRESOLINE) 50 MG tablet Take 50 mg by mouth 2 (two) times daily.  Marland Kitchen HYDROcodone-acetaminophen (NORCO/VICODIN) 5-325 MG per tablet Take 1 tablet by mouth as needed.   . metFORMIN (GLUCOPHAGE) 500 MG tablet Take 500 mg by mouth daily as needed.   . mometasone-formoterol (DULERA) 100-5 MCG/ACT AERO Inhale 2 puffs into the lungs.  . Multiple Vitamin (MULTIVITAMIN) tablet Take 1 tablet by mouth daily.    . nebivolol (BYSTOLIC) 5 MG tablet Take 5 mg by mouth daily.  . nitroGLYCERIN (NITROSTAT) 0.4 MG SL tablet Place 1 tablet (0.4 mg total) under the tongue every 5 (five) minutes as needed for chest pain.  . potassium chloride (KLOR-CON 10) 10 MEQ tablet TAKE 1/2 TABLET BY MOUTH TWICE A DAY AS NEEDED WITH BUMEX  .  spironolactone (ALDACTONE) 25 MG tablet Take 25 mg by mouth daily.  Marland Kitchen tiotropium (SPIRIVA) 18 MCG inhalation capsule Place 1 capsule (18 mcg total) into inhaler and inhale daily.  Marland Kitchen triamcinolone (KENALOG) 0.025 % cream Apply 1 application topically as needed.   . [DISCONTINUED] bumetanide (BUMEX) 1 MG tablet Take 1 tab BID PRN  . [DISCONTINUED] amiodarone (PACERONE) 200 MG tablet Take 0.5 tablets (100 mg total) by mouth daily.     Review of Systems  HENT: Negative.   Eyes: Negative.   Cardiovascular: Positive for leg swelling.  Gastrointestinal: Negative.   Endocrine: Negative.   Musculoskeletal: Positive for arthralgias, gait problem and myalgias.  Skin: Negative.   Allergic/Immunologic: Negative.   Neurological: Negative.   Hematological: Negative.   Psychiatric/Behavioral: Negative.   All other systems reviewed and are negative.   BP 120/70  Pulse 63  Ht 5\' 1"  (1.549 m)  Wt 160 lb (72.576 kg)  BMI 30.25 kg/m2  Physical Exam  Nursing note and vitals reviewed. Constitutional: She is oriented to person, place, and time. She appears well-developed and well-nourished.  HENT:  Head: Normocephalic.  Nose: Nose normal.  Mouth/Throat: Oropharynx is clear and moist.  Eyes: Conjunctivae are  normal. Pupils are equal, round, and reactive to light.  Neck: Normal range of motion. Neck supple. No JVD present. Carotid bruit is present.  Cardiovascular: Normal rate, regular rhythm, S1 normal, S2 normal and intact distal pulses.  Exam reveals no gallop and no friction rub.   Murmur heard.  Crescendo systolic murmur is present with a grade of 2/6  1+ pitting edema to below the knees bilaterally  Pulmonary/Chest: Effort normal. No respiratory distress. She has decreased breath sounds. She has no wheezes. She has rales. She exhibits no tenderness.  Abdominal: Soft. Bowel sounds are normal. She exhibits no distension. There is no tenderness.  Musculoskeletal: Normal range of motion. She  exhibits no edema and no tenderness.  Lymphadenopathy:    She has no cervical adenopathy.  Neurological: She is alert and oriented to person, place, and time. Coordination normal.  Skin: Skin is warm and dry. No rash noted. No erythema.  Psychiatric: She has a normal mood and affect. Her behavior is normal. Judgment and thought content normal.    Assessment and Plan

## 2014-08-12 NOTE — Patient Instructions (Addendum)
Your next appointment will be scheduled in our new office located at :  [facility]  [address], [address]  [place], [place] [id number]  You are doing well. No medication changes were made.  Please call us if you have new issues that need to be addressed before your next appt.  Your physician wants you to follow-up in: 3 months.  You will receive a reminder letter in the mail two months in advance. If you don't receive a letter, please call our office to schedule the follow-up appointment.

## 2014-08-12 NOTE — Assessment & Plan Note (Signed)
Stable, no recent exacerbations. 

## 2014-08-18 ENCOUNTER — Other Ambulatory Visit: Payer: Self-pay | Admitting: Cardiovascular Disease

## 2014-08-22 ENCOUNTER — Ambulatory Visit: Payer: Self-pay | Admitting: Internal Medicine

## 2014-10-08 ENCOUNTER — Encounter: Payer: Self-pay | Admitting: Cardiovascular Disease

## 2014-10-08 ENCOUNTER — Ambulatory Visit (INDEPENDENT_AMBULATORY_CARE_PROVIDER_SITE_OTHER): Payer: Medicare Other | Admitting: Cardiovascular Disease

## 2014-10-08 VITALS — BP 124/72 | HR 72 | Ht 61.0 in | Wt 160.0 lb

## 2014-10-08 DIAGNOSIS — I739 Peripheral vascular disease, unspecified: Secondary | ICD-10-CM

## 2014-10-08 DIAGNOSIS — J449 Chronic obstructive pulmonary disease, unspecified: Secondary | ICD-10-CM

## 2014-10-08 DIAGNOSIS — I779 Disorder of arteries and arterioles, unspecified: Secondary | ICD-10-CM

## 2014-10-08 DIAGNOSIS — I5032 Chronic diastolic (congestive) heart failure: Secondary | ICD-10-CM

## 2014-10-08 DIAGNOSIS — E785 Hyperlipidemia, unspecified: Secondary | ICD-10-CM

## 2014-10-08 DIAGNOSIS — I10 Essential (primary) hypertension: Secondary | ICD-10-CM

## 2014-10-08 NOTE — Assessment & Plan Note (Signed)
Breathing relatively stable. Possible exacerbation of her diastolic CHF. Encouraged her to take her Bumex. Some congestion though no signs of upper respiratory infection

## 2014-10-08 NOTE — Assessment & Plan Note (Signed)
Continue zetia. She does not want a statin.  

## 2014-10-08 NOTE — Progress Notes (Signed)
Patient ID: Catherine Holmes, female    DOB: 1928-06-08, 80 y.o.   MRN: [id number]  HPI Comments: 80 year-old woman with a history of DM,  peripheral vascular disease, 60 to 70% carotid arterial disease, moderate arterial disease of the lower extremities, status post bilateral iliac stenting, moderate renal stenosis, hyperlipidemia, history of severe  hypertension with labile blood pressures, history of episodic shortness of breath and chest squeezing relieved with albuterol/nebs who presents for routine followup. She has a sulfa allergy to loop diuretics. History of falls.   left carotid arterial stenosis estimated at 60-79% disease. She has had several admissions for diastolic CHF, typically from dietary indiscretion and periodic noncompliance with diuretics She was admitted on 11/02/2013 , discharged on 11/16/2013  admitted on 01/24/2014, discharge from 01/27/2014 for acute respiratory failure secondary to COPD History of anemia followed by hematology She presents today for follow-up of her chronic diastolic CHF  In follow-up, she reports that she has pitting lower extremity edema. She's not been taking her Bumex on a regular basis, only as needed. She does have some cough and congestion. She goes out to eat frequently. Reports that her sugars are well controlled, blood pressure also well controlled at home Seen yesterday in orthopedics for her hip  EKG on today's visit showing normal sinus rhythm with rate 72 bpm, nonspecific ST abnormality Followed by Dr. Cherylann RatelLateef for her kidney issues  PMH She stopped her amiodarone out of concern for possible pulmonary complications. Denies having any palpitations She does report having rare episodes of near syncope wall in a sitting position. Comes across her like a wave it resolves relatively quickly  She was in the hospital 05/05/2014 with hypertensive urgency, hyponatremia. Blood pressure is 201/98 and she was put back on her outpatient regiment with  improvement of her symptoms. She suffered a fractured hip on 05/07/2014 and we were consulted for preop evaluation. Found to have elevated at her pressures at 65. She was discharged on 05/17/2014 EKGs from her hospital course showed she had atrial fibrillation on 05/09/2014  Previous Echocardiogram in the hospital showed normal ejection fraction, moderate to severe, hypertension, moderately dilated left atrium  Has a history of large hiatal hernia She is not able to tolerate statins. She does handle zetia without significant side effects. previously encouraged to take red yeast rice.   Previous weight October 2014 was 148 pounds.  weight at the end of December 2014 was 160 pounds, she had increasing shortness of breath, had not been taking Bumex    in the hospital at [facility] in 2012 for general malaise, severe hypertension, shortness of breath, chest pain. She had a stress test, lexiscan that showed no significant ischemia. Significant shortness of breath with lexiscan.   hospital admission on 01/08/2012 for shortness of breath and syncope. She was found to have significant anemia. Treated with Lasix for diastolic CHF.  Underlying severe left carotid arterial disease estimated at 70%.   Prior followup with Dr. Welton FlakesKhan in pulmonary with numerous CT scans, MRIs, PFTs, sleep study. She was told that she had hypoxemia overnight and was started on nighttime oxygen.    hospitalization 05/27/2013 for acute on chronic diastolic CHF. She was treated with IV Bumex twice a day with improvement of her symptoms. Getting factor was her anemia with hematocrit of 25.  Previous iron infusion. In the second week of September 2014 she fell outside a cracker barrel and hit her head. She has been slow to recover. She still has a knot  on her head.  Echocardiogram in the hospital August 2014 shows normal ejection fraction, severely elevated right ventricular systolic pressures, mild to moderate TR  Outpatient Encounter  Prescriptions as of 10/08/2014  Medication Sig  . albuterol (PROAIR HFA) 108 (90 BASE) MCG/ACT inhaler Inhale 2 puffs into the lungs every 6 (six) hours as needed.  Marland Kitchen albuterol (PROVENTIL) (2.5 MG/3ML) 0.083% nebulizer solution Take 2.5 mg by nebulization every 6 (six) hours as needed.    Marland Kitchen arformoterol (BROVANA) 15 MCG/2ML NEBU Take 2 mLs (15 mcg total) by nebulization 2 (two) times daily.  Marland Kitchen aspirin 81 MG tablet Take 81 mg by mouth daily.    . beta carotene w/minerals (OCUVITE) tablet Take 1 tablet by mouth 2 (two) times daily.  . bumetanide (BUMEX) 1 MG tablet Take 1 mg by mouth 2 (two) times daily as needed. Take 1 tab BID PRN  . Cholecalciferol (VITAMIN D-3 PO) Take 1,000 Units by mouth.  . citalopram (CELEXA) 20 MG tablet Take 20 mg by mouth daily.  Marland Kitchen dexlansoprazole (DEXILANT) 60 MG capsule Take 60 mg by mouth daily as needed.  . diphenhydramine-acetaminophen (TYLENOL PM) 25-500 MG TABS Take 1 tablet by mouth at bedtime as needed.  . doxazosin (CARDURA) 4 MG tablet Take 4 mg by mouth at bedtime.   Marland Kitchen ezetimibe (ZETIA) 10 MG tablet Take 1 tablet (10 mg total) by mouth daily.  Marland Kitchen gabapentin (NEURONTIN) 100 MG capsule Take 100 mg by mouth 2 (two) times daily.   . hydrALAZINE (APRESOLINE) 50 MG tablet Take 50 mg by mouth 2 (two) times daily.  . hydrALAZINE (APRESOLINE) 50 MG tablet TAKE 1/2 TO 1 TABLET BY MOUTH TWICE A DAY AS NEEDED  . HYDROcodone-acetaminophen (NORCO/VICODIN) 5-325 MG per tablet Take 1 tablet by mouth as needed.   . metFORMIN (GLUCOPHAGE) 500 MG tablet Take 500 mg by mouth daily as needed.   . mometasone-formoterol (DULERA) 100-5 MCG/ACT AERO Inhale 2 puffs into the lungs.  . Multiple Vitamin (MULTIVITAMIN) tablet Take 1 tablet by mouth daily.    . nebivolol (BYSTOLIC) 5 MG tablet Take 5 mg by mouth daily.  . nitroGLYCERIN (NITROSTAT) 0.4 MG SL tablet Place 1 tablet (0.4 mg total) under the tongue every 5 (five) minutes as needed for chest pain.  . potassium chloride  (KLOR-CON 10) 10 MEQ tablet TAKE 1/2 TABLET BY MOUTH TWICE A DAY AS NEEDED WITH BUMEX  . spironolactone (ALDACTONE) 25 MG tablet Take 25 mg by mouth daily.  Marland Kitchen tiotropium (SPIRIVA) 18 MCG inhalation capsule Place 1 capsule (18 mcg total) into inhaler and inhale daily.  Marland Kitchen triamcinolone (KENALOG) 0.025 % cream Apply 1 application topically as needed.    Social hx History   Social History  . Marital Status: Single    Spouse Name: N/A    Number of Children: 2  . Years of Education: N/A   Occupational History  . Retired     former IT sales professional   Social History Main Topics  . Smoking status: Former Smoker -- 0.50 packs/day for 50 years    Types: Cigarettes    Quit date: 10/22/2000  . Smokeless tobacco: Never Used  . Alcohol Use: No  . Drug Use: No  . Sexual Activity: Not on file   Other Topics Concern  . Not on file   Social History Narrative     Review of Systems  Cardiovascular: Positive for leg swelling.  Gastrointestinal: Negative.   Musculoskeletal: Positive for myalgias, arthralgias and gait problem.  Neurological: Negative.  Hematological: Negative.   Psychiatric/Behavioral: Negative.   All other systems reviewed and are negative.   BP 124/72 mmHg  Pulse 72  Ht 5\' 1"  (1.549 m)  Wt 160 lb (72.576 kg)  BMI 30.25 kg/m2  Physical Exam  Constitutional: She is oriented to person, place, and time. She appears well-developed and well-nourished.  HENT:  Head: Normocephalic.  Nose: Nose normal.  Mouth/Throat: Oropharynx is clear and moist.  Eyes: Conjunctivae are normal. Pupils are equal, round, and reactive to light.  Neck: Normal range of motion. Neck supple. No JVD present.  Cardiovascular: Normal rate, regular rhythm, S1 normal, S2 normal, normal heart sounds and intact distal pulses.  Exam reveals no gallop and no friction rub.   No murmur heard. Trace to 1+ pitting edema to the midshin  Pulmonary/Chest: Effort normal and breath sounds normal. No  respiratory distress. She has no wheezes. She has no rales. She exhibits no tenderness.  Abdominal: Soft. Bowel sounds are normal. She exhibits no distension. There is no tenderness.  Musculoskeletal: Normal range of motion. She exhibits edema. She exhibits no tenderness.  Lymphadenopathy:    She has no cervical adenopathy.  Neurological: She is alert and oriented to person, place, and time. Coordination normal.  Skin: Skin is warm and dry. No rash noted. No erythema.  Psychiatric: She has a normal mood and affect. Her behavior is normal. Judgment and thought content normal.    Assessment and Plan  Nursing note and vitals reviewed.

## 2014-10-08 NOTE — Assessment & Plan Note (Signed)
Underlying carotid arterial disease. Unable to tolerate a statin Repeat ultrasound in 2016 Prior ultrasound estimated at 70% carotid disease 

## 2014-10-08 NOTE — Patient Instructions (Signed)
You are doing well. No medication changes were made.  Please call us if you have new issues that need to be addressed before your next appt.  Your physician wants you to follow-up in: 6 months.  You will receive a reminder letter in the mail two months in advance. If you don't receive a letter, please call our office to schedule the follow-up appointment.   

## 2014-10-08 NOTE — Assessment & Plan Note (Signed)
Blood pressure is well controlled on today's visit. No changes made to the medications. 

## 2014-10-08 NOTE — Assessment & Plan Note (Signed)
We have recommended that she take Bumex 1 mg daily especially for leg edema. Extra Bumex in the afternoon for worsening leg edema

## 2014-10-09 ENCOUNTER — Other Ambulatory Visit: Payer: Self-pay | Admitting: Cardiovascular Disease

## 2014-11-05 ENCOUNTER — Ambulatory Visit: Payer: Self-pay | Admitting: Hematology and Oncology

## 2014-11-17 LAB — CBC CANCER CENTER
Basophil #: 0 x10 3/mm (ref 0.0–0.1)
Basophil %: 0.6 %
Eosinophil #: 0.1 x10 3/mm (ref 0.0–0.7)
Eosinophil %: 0.9 %
HCT: 33.9 % — AB (ref 35.0–47.0)
HGB: 11 g/dL — ABNORMAL LOW (ref 12.0–16.0)
LYMPHS PCT: 12.1 %
Lymphocyte #: 0.9 x10 3/mm — ABNORMAL LOW (ref 1.0–3.6)
MCH: 29.6 pg (ref 26.0–34.0)
MCHC: 32.4 g/dL (ref 32.0–36.0)
MCV: 91 fL (ref 80–100)
MONO ABS: 0.5 x10 3/mm (ref 0.2–0.9)
Monocyte %: 6.7 %
Neutrophil #: 5.9 x10 3/mm (ref 1.4–6.5)
Neutrophil %: 79.7 %
Platelet: 264 x10 3/mm (ref 150–440)
RBC: 3.71 10*6/uL — ABNORMAL LOW (ref 3.80–5.20)
RDW: 14.6 % — AB (ref 11.5–14.5)
WBC: 7.4 x10 3/mm (ref 3.6–11.0)

## 2014-11-17 LAB — FERRITIN: FERRITIN (ARMC): 23 ng/mL (ref 8–388)

## 2014-11-22 ENCOUNTER — Telehealth: Payer: Self-pay | Admitting: Cardiovascular Disease

## 2014-11-22 ENCOUNTER — Ambulatory Visit: Payer: Self-pay | Admitting: Hematology and Oncology

## 2014-11-22 NOTE — Telephone Encounter (Signed)
Patients daughter called to get something for patients nerves.  The patients son just died and per daughter she took a nitroglycerin  ?? to help calm her down but thinks she may need something stronger.  When asked if she had called the PCP daughter said that she thought this would be a heart related episode and she would [facility] or Dr. Mariah MillingGollan about what to take.  Please call patient.

## 2014-11-22 NOTE — Telephone Encounter (Signed)
Spoke w/ pt's daughter. She reports that pt's son died unexpectedly and pt is not coping well.  Advised her that we cannot call in "nerve pills" and that pt will need to contact PCP for these.  She verbalizes understanding and will call back if we can be of further assistance.

## 2014-11-23 ENCOUNTER — Ambulatory Visit: Payer: Medicare Other | Admitting: Cardiovascular Disease

## 2014-12-07 ENCOUNTER — Ambulatory Visit (INDEPENDENT_AMBULATORY_CARE_PROVIDER_SITE_OTHER): Payer: Medicare Other | Admitting: Cardiovascular Disease

## 2014-12-07 ENCOUNTER — Encounter: Payer: Self-pay | Admitting: Cardiovascular Disease

## 2014-12-07 VITALS — BP 122/82 | HR 66 | Ht 63.0 in | Wt 170.2 lb

## 2014-12-07 DIAGNOSIS — I10 Essential (primary) hypertension: Secondary | ICD-10-CM

## 2014-12-07 DIAGNOSIS — I779 Disorder of arteries and arterioles, unspecified: Secondary | ICD-10-CM

## 2014-12-07 DIAGNOSIS — R0602 Shortness of breath: Secondary | ICD-10-CM

## 2014-12-07 DIAGNOSIS — IMO0002 Reserved for concepts with insufficient information to code with codable children: Secondary | ICD-10-CM

## 2014-12-07 DIAGNOSIS — I739 Peripheral vascular disease, unspecified: Secondary | ICD-10-CM

## 2014-12-07 DIAGNOSIS — J449 Chronic obstructive pulmonary disease, unspecified: Secondary | ICD-10-CM

## 2014-12-07 DIAGNOSIS — I5032 Chronic diastolic (congestive) heart failure: Secondary | ICD-10-CM

## 2014-12-07 DIAGNOSIS — E1165 Type 2 diabetes mellitus with hyperglycemia: Secondary | ICD-10-CM

## 2014-12-07 MED ORDER — HYDRALAZINE HCL 50 MG PO TABS: 50.0000 mg | ORAL_TABLET | Freq: Two times a day (BID) | ORAL | Status: DC

## 2014-12-07 MED ORDER — BUMETANIDE 1 MG PO TABS: 1.0000 mg | ORAL_TABLET | Freq: Two times a day (BID) | ORAL | Status: DC | PRN

## 2014-12-07 NOTE — Assessment & Plan Note (Signed)
Recommended she modify her diet. Eats out frequently at restaurants.

## 2014-12-07 NOTE — Assessment & Plan Note (Signed)
Breathing relatively stable.  

## 2014-12-07 NOTE — Patient Instructions (Addendum)
You have significant weight gain and swelling  Please weight daily Please start bumex twice a day with potassium twice a day Goal weight <160 pounds  Please call us if you have new issues that need to be addressed before your next appt.  Your physician wants you to follow-up in: 2 to 3 weeks

## 2014-12-07 NOTE — Assessment & Plan Note (Signed)
Blood pressure is well controlled on today's visit. No changes made to the medications. 

## 2014-12-07 NOTE — Assessment & Plan Note (Signed)
Underlying carotid arterial disease. Unable to tolerate a statin Repeat ultrasound in 2016 Prior ultrasound estimated at 70% carotid disease 

## 2014-12-07 NOTE — Assessment & Plan Note (Signed)
We have recommended that she take Bumex 1 mg twice a day with goal 10-15 pound weight loss. Recommended strict fluid intake, daily weights. If no significant improvement in her weight by Friday, recommended that she call us several days from now. We may need to add metolazone

## 2014-12-07 NOTE — Progress Notes (Signed)
Patient ID: Catherine AmassHelen K Holmes, female    DOB: 04/30/1928, 81 y.o.   MRN: [id number]  HPI Comments: 81 year-old woman with a history of DM,  peripheral vascular disease, 60 to 70% carotid arterial disease, moderate arterial disease of the lower extremities, status post bilateral iliac stenting, moderate renal stenosis, hyperlipidemia, history of severe  hypertension with labile blood pressures, history of episodic shortness of breath and chest squeezing relieved with albuterol/nebs who presents for routine followup. She has a sulfa allergy to loop diuretics. History of falls.   left carotid arterial stenosis estimated at 60-79% disease. She has had several admissions for diastolic CHF, typically from dietary indiscretion and periodic noncompliance with diuretics She was admitted on 11/02/2013 , discharged on 11/16/2013  admitted on 01/24/2014, discharge from 01/27/2014 for acute respiratory failure secondary to COPD History of anemia followed by hematology She presents today for follow-up of her chronic diastolic CHF  In follow-up today, she reports her leg edema is getting worse, weight is up at least 10 pounds, if not 15 pounds. She does not feel well. She has a cough when she lays back in bed. She has been drinking more fluids, taking Bumex inconsistently. Legs are tight, sore, cracking. Recent loss of her son which has been very difficult for her. He was in his late 2460s and passed unexpectedly. They do continue to go out for meals at restaurants.  EKG on today's visit shows normal sinus rhythm with rate 66 bpm, no significant ST or T-wave changes Followed by Dr. Cherylann RatelLateef for her kidney issues  Other past medical history She stopped her amiodarone out of concern for possible pulmonary complications. Denies having any palpitations She does report having rare episodes of near syncope wall in a sitting position. Comes across her like a wave it resolves relatively quickly  She was in the hospital  05/05/2014 with hypertensive urgency, hyponatremia. Blood pressure is 201/98 and she was put back on her outpatient regiment with improvement of her symptoms. She suffered a fractured hip on 05/07/2014 and we were consulted for preop evaluation. Found to have elevated at her pressures at 65. She was discharged on 05/17/2014 EKGs from her hospital course showed she had atrial fibrillation on 05/09/2014  Previous Echocardiogram in the hospital showed normal ejection fraction, moderate to severe, hypertension, moderately dilated left atrium  Has a history of large hiatal hernia She is not able to tolerate statins. She does handle zetia without significant side effects. previously encouraged to take red yeast rice.   Previous weight October 2014 was 148 pounds.  weight at the end of December 2014 was 160 pounds, she had increasing shortness of breath, had not been taking Bumex    in the hospital at [facility] in 2012 for general malaise, severe hypertension, shortness of breath, chest pain. She had a stress test, lexiscan that showed no significant ischemia. Significant shortness of breath with lexiscan.   hospital admission on 01/08/2012 for shortness of breath and syncope. She was found to have significant anemia. Treated with Lasix for diastolic CHF.  Underlying severe left carotid arterial disease estimated at 70%.   Prior followup with Dr. Welton FlakesKhan in pulmonary with numerous CT scans, MRIs, PFTs, sleep study. She was told that she had hypoxemia overnight and was started on nighttime oxygen.    hospitalization 05/27/2013 for acute on chronic diastolic CHF. She was treated with IV Bumex twice a day with improvement of her symptoms. Getting factor was her anemia with hematocrit of 25.  Previous  iron infusion. In the second week of September 2014 she fell outside a cracker barrel and hit her head. She has been slow to recover. She still has a knot on her head.  Echocardiogram in the hospital August 2014  shows normal ejection fraction, severely elevated right ventricular systolic pressures, mild to moderate TR  Outpatient Encounter Prescriptions as of 12/07/2014  Medication Sig  . albuterol (PROAIR HFA) 108 (90 BASE) MCG/ACT inhaler Inhale 2 puffs into the lungs every 6 (six) hours as needed.  Marland Kitchen albuterol (PROVENTIL) (2.5 MG/3ML) 0.083% nebulizer solution Take 2.5 mg by nebulization every 6 (six) hours as needed.    Marland Kitchen arformoterol (BROVANA) 15 MCG/2ML NEBU Take 2 mLs (15 mcg total) by nebulization 2 (two) times daily.  Marland Kitchen aspirin 81 MG tablet Take 81 mg by mouth daily.    . beta carotene w/minerals (OCUVITE) tablet Take 1 tablet by mouth 2 (two) times daily.  . bumetanide (BUMEX) 1 MG tablet Take 1 tablet (1 mg total) by mouth 2 (two) times daily as needed. Take 1 tab BID PRN  . Cholecalciferol (VITAMIN D-3 PO) Take 1,000 Units by mouth.  . citalopram (CELEXA) 20 MG tablet Take 20 mg by mouth daily.  Marland Kitchen dexlansoprazole (DEXILANT) 60 MG capsule Take 60 mg by mouth daily as needed.  . diphenhydramine-acetaminophen (TYLENOL PM) 25-500 MG TABS Take 1 tablet by mouth at bedtime as needed.  . doxazosin (CARDURA) 4 MG tablet Take 4 mg by mouth at bedtime.   Marland Kitchen ezetimibe (ZETIA) 10 MG tablet Take 1 tablet (10 mg total) by mouth daily.  Marland Kitchen gabapentin (NEURONTIN) 100 MG capsule Take 100 mg by mouth 2 (two) times daily.   . hydrALAZINE (APRESOLINE) 50 MG tablet Take 1 tablet (50 mg total) by mouth 2 (two) times daily.  Marland Kitchen HYDROcodone-acetaminophen (NORCO/VICODIN) 5-325 MG per tablet Take 1 tablet by mouth as needed.   . metFORMIN (GLUCOPHAGE) 500 MG tablet Take 500 mg by mouth daily as needed.   . mometasone-formoterol (DULERA) 100-5 MCG/ACT AERO Inhale 2 puffs into the lungs.  . Multiple Vitamin (MULTIVITAMIN) tablet Take 1 tablet by mouth daily.    . nebivolol (BYSTOLIC) 5 MG tablet Take 5 mg by mouth daily.  . nitroGLYCERIN (NITROSTAT) 0.4 MG SL tablet Place 1 tablet (0.4 mg total) under the tongue every 5  (five) minutes as needed for chest pain.  . potassium chloride (KLOR-CON 10) 10 MEQ tablet TAKE 1/2 TABLET BY MOUTH TWICE A DAY AS NEEDED WITH BUMEX  . spironolactone (ALDACTONE) 25 MG tablet Take 25 mg by mouth daily.  Marland Kitchen tiotropium (SPIRIVA) 18 MCG inhalation capsule Place 1 capsule (18 mcg total) into inhaler and inhale daily.  Marland Kitchen triamcinolone (KENALOG) 0.025 % cream Apply 1 application topically as needed.   . [DISCONTINUED] bumetanide (BUMEX) 1 MG tablet Take 1 mg by mouth 2 (two) times daily as needed. Take 1 tab BID PRN  . [DISCONTINUED] hydrALAZINE (APRESOLINE) 50 MG tablet TAKE 1/2 TO 1 TABLET BY MOUTH TWICE A DAY AS NEEDED  . [DISCONTINUED] doxazosin (CARDURA) 4 MG tablet TAKE 1 TABLET (4 MG TOTAL) BY MOUTH AT BEDTIME. (Patient not taking: Reported on 12/07/2014)  . [DISCONTINUED] hydrALAZINE (APRESOLINE) 50 MG tablet Take 50 mg by mouth 2 (two) times daily.   Social hx History   Social History  . Marital Status: Single    Spouse Name: N/A  . Number of Children: 2  . Years of Education: N/A   Occupational History  . Retired  former IT sales professional   Social History Main Topics  . Smoking status: Former Smoker -- 0.50 packs/day for 50 years    Types: Cigarettes    Quit date: 10/22/2000  . Smokeless tobacco: Never Used  . Alcohol Use: No  . Drug Use: No  . Sexual Activity: Not on file   Other Topics Concern  . Not on file   Social History Narrative    Review of Systems  Constitutional: Positive for fatigue.  Respiratory: Positive for cough and shortness of breath.   Cardiovascular: Positive for leg swelling.  Gastrointestinal: Negative.   Musculoskeletal: Positive for myalgias, arthralgias and gait problem.  Neurological: Negative.   Hematological: Negative.   Psychiatric/Behavioral: Negative.   All other systems reviewed and are negative.   BP 122/82 mmHg  Pulse 66  Ht  (1.6 m)  Wt 170 lb 4 oz (77.225 kg)  BMI 30.17 kg/m2  Physical Exam   Constitutional: She is oriented to person, place, and time. She appears well-developed and well-nourished.  HENT:  Head: Normocephalic.  Nose: Nose normal.  Mouth/Throat: Oropharynx is clear and moist.  Eyes: Conjunctivae are normal. Pupils are equal, round, and reactive to light.  Neck: Normal range of motion. Neck supple. No JVD present.  Cardiovascular: Normal rate, regular rhythm, S1 normal, S2 normal, normal heart sounds and intact distal pulses.  Exam reveals no gallop and no friction rub.   No murmur heard. 1-2+ pitting edema to below the knees  Pulmonary/Chest: Effort normal and breath sounds normal. No respiratory distress. She has no wheezes. She has no rales. She exhibits no tenderness.  Abdominal: Soft. Bowel sounds are normal. She exhibits no distension. There is no tenderness.  Musculoskeletal: Normal range of motion. She exhibits edema. She exhibits no tenderness.  Lymphadenopathy:    She has no cervical adenopathy.  Neurological: She is alert and oriented to person, place, and time. Coordination normal.  Skin: Skin is warm and dry. No rash noted. No erythema.  Psychiatric: She has a normal mood and affect. Her behavior is normal. Judgment and thought content normal.    Assessment and Plan  Nursing note and vitals reviewed.

## 2014-12-10 ENCOUNTER — Telehealth: Payer: Self-pay | Admitting: *Deleted

## 2014-12-10 DIAGNOSIS — R609 Edema, unspecified: Secondary | ICD-10-CM

## 2014-12-10 NOTE — Telephone Encounter (Signed)
Pt daughter is calling stating that pt is more fluid now, bumex doesn't seem to be working She said she used to take a little pink pill, that used to work better Are asking for stronger but plasmic.  Her ankle when she mashes it in, it will stay mashed in.  something that weight gain is weight for patient isn't moving much either.  She is taking is now but it doesn't seem to be working,.from ankle or leg.  Please advise  Also says to have a great day!

## 2014-12-10 NOTE — Telephone Encounter (Signed)
Spoke w/ pt's daughter. Advised her that Dr. Mariah MillingGollan recommends that pt take Bumex 2 mg BID over the weekend. Advised her to call back on Monday and if sx have not resolved, we have the option of calling in metolazone 5 mg to be used sparingly, but we want to try the increase in Bumex first.  Discussed w/ her pt's diet and advised pt not to eat out so frequently.  Eunice BlaseDebbie states that pt has some leftover [place] steak that she is planning on eating for supper, but they will try to cook at home this weekend. Asked her to call back on Monday if sx have not resolved.

## 2014-12-13 ENCOUNTER — Telehealth: Payer: Self-pay

## 2014-12-13 NOTE — Telephone Encounter (Signed)
Please see previous phone note.  

## 2014-12-13 NOTE — Telephone Encounter (Signed)
Low blood pressure like that would suggest significant diuresis over the weekend on bumex 2 pills twice a day  Has there been any weight loss to suggest diuresis? Would check labs, BMP if not feeling well. Go back to bumex one a day until labs back.  Would use ace wraps on legs, leg elevation. This may help move fluid along

## 2014-12-13 NOTE — Telephone Encounter (Signed)
Pt daughter called, states pt weight has not gone done significantly. States her legs were swollen last night., states she did soak her feet last night and put some cream on them that was prescribed for her by her dermatologist.  States Bumax is making her BP drop extremely low, she just remembers that the top "# was 80" .Also states that the Bumex make her "extremely sick" Please call

## 2014-12-13 NOTE — Telephone Encounter (Signed)
Catherine Holmes at 12/13/2014 8:38 AM     Status: Signed       Expand All Collapse All   Pt daughter called, states pt weight has not gone done significantly. States her legs were swollen last night., states she did soak her feet last night and put some cream on them that was prescribed for her by her dermatologist. States Bumax is making her BP drop extremely low, she just remembers that the top "# was 80" .Also states that the Bumex make her "extremely sick" Please call

## 2014-12-14 NOTE — Addendum Note (Signed)
Addended by: Rhea BeltonMOODY, AMANDA R on: 12/14/2014 10:23 AM   Modules accepted: Orders

## 2014-12-14 NOTE — Telephone Encounter (Signed)
Spoke w/ pt and her daughter.  Advised them of Dr. Windell HummingbirdGollan's recommendation  She is agreeable to this and will come in tomorrow for labs.  She reports that pt's SBP on Sat am was 80-90.  She has only been taking one Bumex a day since then and systolic pressures have been 130-140s. Had discussion w/ pt's daughter about pt's diet.  She reports that pt eats out frequently but tries to make healthy choices. Discussed w/ her the amount of sodium in these types of foods and she verbalizes understanding.  Advised her that the HF clinic can provide her w/ education and assistance w/ her sx.  She is very interested in this an requests an appt.  Referral faxed to HF clinic.

## 2014-12-15 ENCOUNTER — Other Ambulatory Visit (INDEPENDENT_AMBULATORY_CARE_PROVIDER_SITE_OTHER): Payer: Medicare Other | Admitting: *Deleted

## 2014-12-15 DIAGNOSIS — R609 Edema, unspecified: Secondary | ICD-10-CM

## 2014-12-15 NOTE — Telephone Encounter (Signed)
Pt sched for HF clinic 01/07/15 @ 10:00.

## 2014-12-16 ENCOUNTER — Telehealth: Payer: Self-pay | Admitting: Cardiovascular Disease

## 2014-12-16 LAB — BASIC METABOLIC PANEL
BUN/Creatinine Ratio: 26 (ref 11–26)
BUN: 19 mg/dL (ref 8–27)
CO2: 30 mmol/L — ABNORMAL HIGH (ref 18–29)
CREATININE: 0.73 mg/dL (ref 0.57–1.00)
Calcium: 9.9 mg/dL (ref 8.7–10.3)
Chloride: 83 mmol/L — ABNORMAL LOW (ref 97–108)
GFR calc Af Amer: 87 mL/min/{1.73_m2} (ref >59)
GFR, EST NON AFRICAN AMERICAN: 76 mL/min/{1.73_m2} (ref >59)
Glucose: 110 mg/dL — ABNORMAL HIGH (ref 65–99)
Potassium: 5.4 mmol/L — ABNORMAL HIGH (ref 3.5–5.2)
SODIUM: 129 mmol/L — AB (ref 134–144)

## 2014-12-16 NOTE — Telephone Encounter (Signed)
Amanda at heart failure clinic needs a copy of echo from Jan. 2015 faxed for referral.  Already discussed with Angelica ChessmanMandy will fax to BurginAmanda in clinic.

## 2014-12-17 ENCOUNTER — Ambulatory Visit: Payer: Self-pay | Admitting: Family

## 2014-12-28 ENCOUNTER — Ambulatory Visit (INDEPENDENT_AMBULATORY_CARE_PROVIDER_SITE_OTHER): Payer: Medicare Other | Admitting: Cardiovascular Disease

## 2014-12-28 ENCOUNTER — Encounter: Payer: Self-pay | Admitting: Cardiovascular Disease

## 2014-12-28 VITALS — BP 118/64 | HR 71 | Ht 60.0 in | Wt 167.0 lb

## 2014-12-28 DIAGNOSIS — M7989 Other specified soft tissue disorders: Secondary | ICD-10-CM

## 2014-12-28 DIAGNOSIS — E871 Hypo-osmolality and hyponatremia: Secondary | ICD-10-CM | POA: Diagnosis not present

## 2014-12-28 DIAGNOSIS — I5032 Chronic diastolic (congestive) heart failure: Secondary | ICD-10-CM

## 2014-12-28 DIAGNOSIS — E1165 Type 2 diabetes mellitus with hyperglycemia: Secondary | ICD-10-CM

## 2014-12-28 DIAGNOSIS — J449 Chronic obstructive pulmonary disease, unspecified: Secondary | ICD-10-CM

## 2014-12-28 DIAGNOSIS — I779 Disorder of arteries and arterioles, unspecified: Secondary | ICD-10-CM

## 2014-12-28 DIAGNOSIS — I10 Essential (primary) hypertension: Secondary | ICD-10-CM

## 2014-12-28 DIAGNOSIS — IMO0002 Reserved for concepts with insufficient information to code with codable children: Secondary | ICD-10-CM

## 2014-12-28 DIAGNOSIS — I739 Peripheral vascular disease, unspecified: Secondary | ICD-10-CM

## 2014-12-28 NOTE — Assessment & Plan Note (Signed)
Weight has recently been trending up. Eats out frequently at restaurants.  We have encouraged continued exercise, careful diet management in an effort to lose weight.

## 2014-12-28 NOTE — Patient Instructions (Signed)
You are doing well. No medication changes were made.  We will check BMP today (labs)  Please call us if you have new issues that need to be addressed before your next appt.  Your physician wants you to follow-up in: 2 months.  You will receive a reminder letter in the mail two months in advance. If you don't receive a letter, please call our office to schedule the follow-up appointment.

## 2014-12-28 NOTE — Progress Notes (Signed)
Patient ID: Catherine Holmes, female    DOB: 03-22-1928, 81 y.o.   MRN: [id number]  HPI Comments: 81 year-old woman with a history of DM,  peripheral vascular disease, 60 to 70% carotid arterial disease, moderate arterial disease of the lower extremities, status post bilateral iliac stenting, moderate renal stenosis, hyperlipidemia, history of severe  hypertension with labile blood pressures, history of episodic shortness of breath and chest squeezing relieved with albuterol/nebs who presents for routine followup. She has a sulfa allergy to loop diuretics. History of falls.   left carotid arterial stenosis estimated at 60-79% disease. She has had several admissions for diastolic CHF, typically from dietary indiscretion and periodic noncompliance with diuretics She was admitted on 11/02/2013 , discharged on 11/16/2013  admitted on 01/24/2014, discharge from 01/27/2014 for acute respiratory failure secondary to COPD History of anemia followed by hematology She presents today for follow-up of her chronic diastolic CHF   in follow-up today, weight is slowly trending down, down 4 pounds from her prior clinic visit. She has been more compliant with her Bumex, drinking less fluids. Also recently seen in the heart failure clinic Still continues to eat out frequently.  Legs are less swollen though continues to have edema around the mid shin. Weight at home around 164 pounds Shortness of breath, overall feels better   Other past medical history  Followed by Dr. Cherylann RatelLateef for her kidney issues  She stopped her amiodarone out of concern for possible pulmonary complications. Denies having any palpitations She does report having rare episodes of near syncope wall in a sitting position. Comes across her like a wave it resolves relatively quickly  She was in the hospital 05/05/2014 with hypertensive urgency, hyponatremia. Blood pressure is 201/98 and she was put back on her outpatient regiment with improvement of  her symptoms. She suffered a fractured hip on 05/07/2014 and we were consulted for preop evaluation. Found to have elevated at her pressures at 65. She was discharged on 05/17/2014 EKGs from her hospital course showed she had atrial fibrillation on 05/09/2014  Previous Echocardiogram in the hospital showed normal ejection fraction, moderate to severe, hypertension, moderately dilated left atrium  Has a history of large hiatal hernia She is not able to tolerate statins. She does handle zetia without significant side effects. previously encouraged to take red yeast rice.   Previous weight October 2014 was 148 pounds.  weight at the end of December 2014 was 160 pounds, she had increasing shortness of breath, had not been taking Bumex    in the [facility] at [facility] in 2012 for general malaise, severe hypertension, shortness of breath, chest pain. She had a stress test, lexiscan that showed no significant ischemia. Significant shortness of breath with lexiscan.   hospital admission on 01/08/2012 for shortness of breath and syncope. She was found to have significant anemia. Treated with Lasix for diastolic CHF.  Underlying severe left carotid arterial disease estimated at 70%.   Prior followup with Dr. Welton FlakesKhan in pulmonary with numerous CT scans, MRIs, PFTs, sleep study. She was told that she had hypoxemia overnight and was started on nighttime oxygen.    hospitalization 05/27/2013 for acute on chronic diastolic CHF. She was treated with IV Bumex twice a day with improvement of her symptoms. Getting factor was her anemia with hematocrit of 25.  Previous iron infusion. In the second week of September 2014 she fell outside a cracker barrel and hit her head. She has been slow to recover. She still has a knot  on her head.  Echocardiogram in the hospital August 2014 shows normal ejection fraction, severely elevated right ventricular systolic pressures, mild to moderate TR  Outpatient Encounter Prescriptions  as of 12/28/2014  Medication Sig  . albuterol (PROAIR HFA) 108 (90 BASE) MCG/ACT inhaler Inhale 2 puffs into the lungs every 6 (six) hours as needed.  Marland Kitchen albuterol (PROVENTIL) (2.5 MG/3ML) 0.083% nebulizer solution Take 2.5 mg by nebulization every 6 (six) hours as needed.    Marland Kitchen arformoterol (BROVANA) 15 MCG/2ML NEBU Take 2 mLs (15 mcg total) by nebulization 2 (two) times daily.  Marland Kitchen aspirin 81 MG tablet Take 81 mg by mouth daily.    . beta carotene w/minerals (OCUVITE) tablet Take 1 tablet by mouth 2 (two) times daily.  . bumetanide (BUMEX) 1 MG tablet Take 1 tablet (1 mg total) by mouth 2 (two) times daily as needed. Take 1 tab BID PRN  . Cholecalciferol (VITAMIN D-3 PO) Take 1,000 Units by mouth.  . citalopram (CELEXA) 20 MG tablet Take 20 mg by mouth daily.  Marland Kitchen dexlansoprazole (DEXILANT) 60 MG capsule Take 60 mg by mouth daily as needed.  . diphenhydramine-acetaminophen (TYLENOL PM) 25-500 MG TABS Take 1 tablet by mouth at bedtime as needed.  . doxazosin (CARDURA) 4 MG tablet Take 4 mg by mouth at bedtime.   Marland Kitchen ezetimibe (ZETIA) 10 MG tablet Take 1 tablet (10 mg total) by mouth daily.  Marland Kitchen gabapentin (NEURONTIN) 100 MG capsule Take 100 mg by mouth 2 (two) times daily.   . hydrALAZINE (APRESOLINE) 50 MG tablet Take 1 tablet (50 mg total) by mouth 2 (two) times daily.  Marland Kitchen HYDROcodone-acetaminophen (NORCO/VICODIN) 5-325 MG per tablet Take 1 tablet by mouth as needed.   . metFORMIN (GLUCOPHAGE) 500 MG tablet Take 500 mg by mouth daily as needed.   . mometasone-formoterol (DULERA) 100-5 MCG/ACT AERO Inhale 2 puffs into the lungs.  . Multiple Vitamin (MULTIVITAMIN) tablet Take 1 tablet by mouth daily.    . nebivolol (BYSTOLIC) 5 MG tablet Take 5 mg by mouth daily.  . nitroGLYCERIN (NITROSTAT) 0.4 MG SL tablet Place 1 tablet (0.4 mg total) under the tongue every 5 (five) minutes as needed for chest pain.  . potassium chloride (KLOR-CON 10) 10 MEQ tablet TAKE 1/2 TABLET BY MOUTH TWICE A DAY AS NEEDED WITH  BUMEX  . spironolactone (ALDACTONE) 25 MG tablet Take 25 mg by mouth daily.  Marland Kitchen tiotropium (SPIRIVA) 18 MCG inhalation capsule Place 1 capsule (18 mcg total) into inhaler and inhale daily.  Marland Kitchen triamcinolone (KENALOG) 0.025 % cream Apply 1 application topically as needed.    Social hx History   Social History  . Marital Status: Single    Spouse Name: N/A  . Number of Children: 2  . Years of Education: N/A   Occupational History  . Retired     former IT sales professional   Social History Main Topics  . Smoking status: Former Smoker -- 0.50 packs/day for 50 years    Types: Cigarettes    Quit date: 10/22/2000  . Smokeless tobacco: Never Used  . Alcohol Use: No  . Drug Use: No  . Sexual Activity: Not on file   Other Topics Concern  . Not on file   Social History Narrative    Review of Systems  Constitutional: Negative.   Respiratory: Negative.   Cardiovascular: Positive for leg swelling.  Gastrointestinal: Negative.   Musculoskeletal: Positive for myalgias, arthralgias and gait problem.  Neurological: Negative.   Hematological: Negative.   Psychiatric/Behavioral: Negative.  All other systems reviewed and are negative.   BP 118/64 mmHg  Pulse 71  Ht 5' (1.524 m)  Wt 167 lb (75.751 kg)  BMI 32.62 kg/m2  SpO2 92%  Physical Exam  Constitutional: She is oriented to person, place, and time. She appears well-developed and well-nourished.  HENT:  Head: Normocephalic.  Nose: Nose normal.  Mouth/Throat: Oropharynx is clear and moist.  Eyes: Conjunctivae are normal. Pupils are equal, round, and reactive to light.  Neck: Normal range of motion. Neck supple. No JVD present.  Cardiovascular: Normal rate, regular rhythm, S1 normal, S2 normal, normal heart sounds and intact distal pulses.  Exam reveals no gallop and no friction rub.   No murmur heard. 1-2+ pitting edema to below the knees  Pulmonary/Chest: Effort normal and breath sounds normal. No respiratory distress. She has no  wheezes. She has no rales. She exhibits no tenderness.  Abdominal: Soft. Bowel sounds are normal. She exhibits no distension. There is no tenderness.  Musculoskeletal: Normal range of motion. She exhibits edema. She exhibits no tenderness.  Lymphadenopathy:    She has no cervical adenopathy.  Neurological: She is alert and oriented to person, place, and time. Coordination normal.  Skin: Skin is warm and dry. No rash noted. No erythema.  Psychiatric: She has a normal mood and affect. Her behavior is normal. Judgment and thought content normal.    Assessment and Plan  Nursing note and vitals reviewed.

## 2014-12-28 NOTE — Assessment & Plan Note (Signed)
Underlying carotid arterial disease. Unable to tolerate a statin Repeat ultrasound in late 2016 Prior ultrasound estimated at 70% carotid disease

## 2014-12-28 NOTE — Assessment & Plan Note (Signed)
Blood pressure is well controlled on today's visit. No changes made to the medications. 

## 2014-12-28 NOTE — Assessment & Plan Note (Signed)
Stable COPD, on inhalers

## 2014-12-28 NOTE — Assessment & Plan Note (Signed)
She does not 1 to take Bumex 1 mg twice a day, prefers to stay on 1 mg daily Suggested she continue to do at least this and fluid restriction. We will recheck basic metabolic panel today given recent low sodium

## 2014-12-29 LAB — BASIC METABOLIC PANEL
BUN / CREAT RATIO: 19 (ref 11–26)
BUN: 17 mg/dL (ref 8–27)
CHLORIDE: 87 mmol/L — AB (ref 97–108)
CO2: 30 mmol/L — AB (ref 18–29)
Calcium: 9.4 mg/dL (ref 8.7–10.3)
Creatinine, Ser: 0.88 mg/dL (ref 0.57–1.00)
GFR calc Af Amer: 70 mL/min/{1.73_m2} (ref >59)
GFR, EST NON AFRICAN AMERICAN: 61 mL/min/{1.73_m2} (ref >59)
Glucose: 125 mg/dL — ABNORMAL HIGH (ref 65–99)
Potassium: 5.8 mmol/L — ABNORMAL HIGH (ref 3.5–5.2)
SODIUM: 130 mmol/L — AB (ref 134–144)

## 2014-12-31 ENCOUNTER — Other Ambulatory Visit: Payer: Self-pay

## 2014-12-31 DIAGNOSIS — R609 Edema, unspecified: Secondary | ICD-10-CM

## 2015-01-06 ENCOUNTER — Other Ambulatory Visit (INDEPENDENT_AMBULATORY_CARE_PROVIDER_SITE_OTHER): Payer: Medicare Other

## 2015-01-06 DIAGNOSIS — R609 Edema, unspecified: Secondary | ICD-10-CM

## 2015-01-07 LAB — BASIC METABOLIC PANEL
BUN/Creatinine Ratio: 19 (ref 11–26)
BUN: 12 mg/dL (ref 8–27)
CALCIUM: 8.8 mg/dL (ref 8.7–10.3)
CHLORIDE: 92 mmol/L — AB (ref 97–108)
CO2: 24 mmol/L (ref 18–29)
Creatinine, Ser: 0.63 mg/dL (ref 0.57–1.00)
GFR, EST AFRICAN AMERICAN: 95 mL/min/{1.73_m2} (ref >59)
GFR, EST NON AFRICAN AMERICAN: 83 mL/min/{1.73_m2} (ref >59)
Glucose: 110 mg/dL — ABNORMAL HIGH (ref 65–99)
Potassium: 5 mmol/L (ref 3.5–5.2)
Sodium: 134 mmol/L (ref 134–144)

## 2015-01-11 ENCOUNTER — Telehealth: Payer: Self-pay | Admitting: *Deleted

## 2015-01-11 NOTE — Telephone Encounter (Signed)
Pt daughter is calling about lab works.  Stated we will call when ready.  Please call.

## 2015-01-12 NOTE — Telephone Encounter (Signed)
Pt calling about lab works, knows holiday is coming and just checking on it.  She says if we don't call she will give mandi a "black star"  If we want more "Stars" we need to call them

## 2015-01-12 NOTE — Telephone Encounter (Signed)
Left detailed message on Catherine Holmes's vm that Dr. Mariah MillingGollan reviewed pt's labs and recommends that she continue on Bumex, as well as continue free water restriction. Asked her to call back w/ any questions or concerns.

## 2015-01-12 NOTE — Telephone Encounter (Signed)
Spoke w/ pt's daughter.  Reviewed preliminary results w/ her. She is concerned about whether pt should continue on her Bumex.  Advised her that I will contact her w/ Dr. Windell HummingbirdGollan's recommendation.

## 2015-01-28 ENCOUNTER — Ambulatory Visit
Admit: 2015-01-28 | Disposition: A | Discharge status: Home / Self Care | Payer: Self-pay | Attending: Family | Admitting: Family

## 2015-02-08 NOTE — Consult Note (Signed)
Brief Consult Note: Diagnosis: right proximal humerus fracture.   Patient was seen by consultant.   Comments: cannont tolerate sling secondary to neck problems, will try immobilizer. should heal with nonoperative tteatment.  Electronic Signatures: Leitha SchullerMenz, Winn Muehl J (MD)  (Signed 19-Dec-13 18:05)  Authored: Brief Consult Note   Last Updated: 19-Dec-13 18:05 by Leitha SchullerMenz, Diaz Crago J (MD)

## 2015-02-08 NOTE — Discharge Summary (Signed)
PATIENT NAME:  Catherine Holmes, Catherine Holmes MR#:  [id number] DATE OF BIRTH:  [birth year]  DATE OF ADMISSION:  10/09/2012 DATE OF DISCHARGE:  10/16/2012  DISPOSITION: [facility].  PRIMARY CARE PHYSICIAN: Alonna BucklerAndrew Lamb, MD   DISCHARGE DIAGNOSES: 1. Acute on chronic diastolic congestive heart failure.  2. Chronic respiratory failure.  3. Acute respiratory failure.  4. Right humerus fracture.  5. Fall.  6. Syncope.  7. Malignant hypertension.  8. Hyponatremia.  9. Left internal carotid artery stenosis.  10. Hypokalemia.  11. Debility.  CONSULTANTS: 1. Kennedy BuckerMichael Menz, MD - Orthopedics. 2. Levora DredgeGregory Schnier, MD - Vascular Surgery.  SIGNIFICANT IMAGING STUDIES: CT of the head without contrast showed no acute intracranial abnormality.   CT of the maxillofacial area showed no acute osseous abnormalities.   CT of the cervical spine showed some arthritic changes, no acute fractures.   CTA of the carotids showed severe complex atherosclerotic plaque within left carotid bulb resulting in greater than 70% stenosis.   Chest x-ray showed pulmonary edema.   ADMITTING HISTORY AND PHYSICAL: Please see detailed history and physical dictated on 10/09/2012. In brief, this is an 81 year old Caucasian female patient with history of diabetes, hypertension, diastolic congestive heart failure and COPD on home oxygen who presented to the Emergency Room complaining of multiple falls. Prior to presentation to the ER, the patient had a syncopal episode with fall resulting in a right humerus fracture, was admitted to the hospitalist service for further work-up and treatment of the fracture and syncope.   HOSPITAL COURSE: 1. Syncope and fall. This was thought to be secondary to hypotension. The patient was on multiple blood pressure medications, which have been held. Presently, the patient is being discharged home on just Benicar and clonidine. Her Bystolic and hydralazine along with hydrochlorothiazide have  been stopped. The patient does have significant left internal carotid artery stenosis and her blood pressure needs to be maintained at the high normal to high level between 150 to 160s systolic. The patient has not had any falls or syncopal episodes during the hospital stay.  2. Acute on chronic diastolic congestive heart failure. The patient did have some mild acute on chronic diastolic CHF during the hospital stay going into acute on chronic respiratory failure needing more oxygen. She responded initially with Lasix, but has been allergic to it and was transitioned to Bumex with which she is doing well. At the time of discharge, the patient is on 2 liters oxygen, no crackles on examination. She is on home oxygen of 2 liters.  3. Right humerus fracture. The patient was seen by Dr. Rosita KeaMenz who has suggested outpatient followup in two weeks. The patient was initially in a brace, later on to a sling, but presently is needing a sling and doing well, working with physical therapy and is improving with each day.  4. Hypertension. This has been very labile and difficult to control, but the patient is maintaining a blood pressure between 140 to 165 with systolic, on clonidine 0.2 mg twice a day, and Benicar 40 mg once a day. The patient's hydrochlorothiazide, Bystolic and hydralazine have been stopped secondary to hypotension and syncope.  5. Hyponatremia. The patient does have chronic hyponatremia ranging between 125 to 129. This has been stable. Her hydrochlorothiazide has been stopped for possible cause of the hyponatremia and the patient is asymptomatic with the hyponatremia.   On the day of discharge, the patient's blood pressure is 161/91, pulse 82 and saturating 96% on 2 liters oxygen. She is  being discharged to [facility] skilled nursing facility in a fair condition.   DISCHARGE MEDICATIONS: 1. Spiriva 18 mcg inhaled once a day.  2. Centrum 1 tablet oral once a day. 3. Zetia 10 mg oral once a day.   4. Vitamin D3 1000 international units once a day.  5. Ventolin HFA 2 puffs inhaled 4 times a day.  6. Dulera 2 puffs inhaled 2 times a day.  7. Doxazosin 4 mg oral once a day.  8. Dexlansoprazole 1 capsule oral once a day.  9. Citalopram 20 mg oral once a day.  10. Calcium carbonate 600 mg 2 times a day. 11. Clonidine 0.2 mg 2 times a day.  12. Aspirin 81 mg oral once a day.  13. Gabapentin 100 mg oral 2 times a day.  14. Metformin 500 mg oral 2 times a day.  15. Tramadol 50 mg oral every 4 hours as needed for pain.  16. Benicar 40 mg oral once a day.  17. Lidocaine 5% topical film once a day.  18. Magnesium oxide 400 mg oral 2 times a day.  19. Potassium chloride 20 mEq oral 2 times a day.  20. MiraLax once a day as needed for constipation.  21. Bumex 0.5 mg oral once a day.  22. Tussionex 5 mL oral once a day at bedtime.   DISCHARGE INSTRUCTIONS: Continue with diabetic diet of regular consistency, activity as tolerated with assistance. Follow up with Dr. Wyn Quaker in one week and Dr. Rosita Kea in two weeks. Maintain blood pressure between 150 to 160.   TIME SPENT: Time spent today on this discharge dictation along with coordinating care and counseling of the patient was 45 minutes. ____________________________ Molinda Bailiff Amit Meloy, MD srs:sb D: 10/16/2012 12:14:00 ET T: 10/16/2012 13:02:53 ET JOB#: [id number]  cc: Wardell Heath R. Dorean Hiebert, MD, <Dictator> Reola Mosher. Randa Lynn, MD [facility] Orie Fisherman MD ELECTRONICALLY SIGNED 10/16/2012 14:05

## 2015-02-08 NOTE — Consult Note (Signed)
PATIENT NAME:  Catherine Holmes, Catherine Holmes MR#:  [id number] DATE OF BIRTH:  1928-05-11  DATE OF CONSULTATION:  10/09/2012  CONSULTING PHYSICIAN:  Leitha SchullerMichael J. Sarinity Dicicco, MD  REASON FOR CONSULTATION: Right proximal humerus fracture.   HISTORY OF PRESENT ILLNESS: The patient is an 81 year old who suffered a fall and had been having syncope. She was noted to have swelling and pain in the shoulder and had x-rays that showed a comminuted proximal humerus fracture. I was consulted for evaluation. On exam, the patient was noted to be neurovascularly intact. Skin is intact. She is complaining a great deal on initial evaluation secondary to the sling rubbing on her neck and [facility], she has had some significant neck issues in the recent past. She is tender to the proximal humerus. X-rays were reviewed. They show surgical neck and tuberosity fracture without displacement.   CLINICAL IMPRESSION: Nondisplaced comminuted right proximal humerus fracture.   RECOMMENDATION: A shoulder immobilizer; this is to avoid pressure on the back of her neck. She will need a follow-up x-ray in about two weeks and at six weeks expect her to be healed enough to start physical therapy. No weight bearing on the right upper extremity for a minimum of six weeks.      ____________________________ Leitha SchullerMichael J. Adaliah Hiegel, MD mjm:es D: 10/14/2012 08:30:30 ET T: 10/14/2012 10:11:00 ET JOB#: [id number]  cc: Leitha SchullerMichael J. Mirian Casco, MD, <Dictator> Leitha SchullerMICHAEL J Oluwaferanmi Wain MD ELECTRONICALLY SIGNED 10/15/2012 10:59

## 2015-02-08 NOTE — H&P (Signed)
PATIENT NAME:  Catherine Holmes, Catherine Holmes MR#:  [id number] DATE OF BIRTH:  10/13/1928  DATE OF ADMISSION:  10/09/2012  PRIMARY CARE PHYSICIAN: Alonna Buckler, MD  REFERRING PHYSICIAN: Si Raider, MD  CHIEF COMPLAINT: Multiple falls and syncope today resulting in right humerus fracture and right facial hematoma.   HISTORY OF PRESENT ILLNESS: Catherine Holmes is an 81 year old pleasant Caucasian female with history of diabetes, hypertension, diastolic congestive heart failure and chronic obstructive pulmonary disease, home oxygen dependent. The patient is suffering from frequent falls and had outpatient work-up and nothing revealing. She states that she fell at least 3 times this week. The patient is vague about these episodes, but she states she gets weak and then she will fall. No reported palpitations. No seizure activity. Today she fell again, landed on her face. She stayed on the floor for about 1 hour until her daughter came back. She brought her here. Evaluation here reveals a hematoma at the right forehead and facial area. She has right proximal humerus fracture. Further evaluation here reveals significant hyponatremia at 128, mild hypokalemia and hypomagnesemia.   REVIEW OF SYSTEMS:   CONSTITUTIONAL: Denies any fever. No chills. No fatigue, only during the event and after the event she will have generalized weakness.   EYES: No blurring of vision. No double vision.   ENT: No hearing impairment. No sore throat. No dysphagia.   CARDIOVASCULAR: No chest pain. No shortness of breath, other than her baseline COPD.  No peripheral edema.   RESPIRATORY: Reports dry cough, chronic for her. She attributes that to her gastroesophageal reflux disease. No chest pain. No hemoptysis.   GASTROINTESTINAL: No abdominal pain. No vomiting. No diarrhea.   GENITOURINARY: No dysuria or frequency of urination.   MUSCULOSKELETAL: She has pain in her right shoulder status post fall. No other joint pain or swelling. No  muscular pain or swelling.   INTEGUMENTARY: No skin rash. No ulcers. She has hematoma of the right forehead area from today's fall. She has also other scattered bruises from previous falls.   NEUROLOGY: No focal weakness. No seizure activity. No headache.   PSYCHIATRY: No anxiety. No depression.   ENDOCRINE: No polyuria or polydipsia. No heat or cold intolerance.   PAST MEDICAL HISTORY: 1. History of diastolic congestive heart failure with ejection fraction of 55%.  2. Diabetes mellitus type 2.  3. Systemic hypertension. 4. Iron deficiency anemia.  5. Hyperlipidemia.  6. Gastroesophageal reflux disease.  7. Chronic obstructive pulmonary disease, home oxygen dependent, on 2 liters.  8. Peripheral vascular disease with carotid artery disease.  9. Osteoporosis.  10. Transient ischemic attack. 11. Sigmoid diverticulosis. 12. Sjogren's syndrome. 13. Mitral valve prolapse.   PAST SURGICAL HISTORY: Stent placement to her iliac artery.   SOCIAL HABITS: Ex-chronic smoker. She quit 13 years ago. No alcohol or other drug abuse.   SOCIAL HISTORY: She is divorced, lives at home with her daughter.   FAMILY HISTORY: Her father died at age of 61 from heart attack and her mother died at age of 54, also from heart attack. She has a sister who died from a heart attack at age of 22.   ADMISSION MEDICATIONS:  1. Spiriva 1 inhalation once a day.  2. Dulera 100 mcg 2 puffs twice a day.  3. Zetia 10 mg a day.  4. Vitamin D3 1000 international units once a day. 5. Ventolin inhaler p.r.n.  6. Tramadol 50 mg every four hours p.r.n. 7. Metformin 500 mg twice a day. 8. Hydroxyzine 1 tablet  3 times a day.  9. Gabapentin 100 mg 2 capsules once a day. 10. Ecotrin aspirin once a day. 11. Dexilant 60 mg once a day. 12. Clonidine 0.2 mg twice a day. 13. Centrum Silver 1 tablet once a day.  14. Calcium carbonate 600 mg twice a day. 15. Bystolic 10 mg reported to be twice a day. This is supposed to be  once a day.  16. Benicar with hydrochlorothiazide 40/25 mg 1 tablet once a day.  17. Acetaminophen p.r.n.   ALLERGIES: SHE HAS A LONG LIST OF ALLERGIES. THESE INCLUDE PENICILLIN, DARVON, CODEINE, ZOCOR, SEPTRA, ERYTHROMYCIN, CECLOR, LEVAQUIN, STATIN, ADVAIR DISKUS, EPINEPHRINE, LISINOPRIL, LASIX, GLIPIZIDE AND  DOXYCYCLINE. SOME OF THESE CAUSE SKIN RASH, SOME OF THEM ARE SIDE EFFECTS LIKE MUSCLE ACHING AND NOT A TRUE ALLERGY BUT SIDE EFFECT.   PHYSICAL EXAMINATION:  VITAL SIGNS: Blood pressure 152/89, respiratory rate 16, pulse 81, temperature 97.9 and oxygen saturation 95%.   GENERAL APPEARANCE: Elderly female lying in bed in no icterus. No cyanosis.   HEAD/NECK: No pallor. No icterus. No cyanosis. There is a hematoma over the right forehead area.   ENT: Ear examination revealed normal hearing. No ulcers. No discharge. Nasal mucosa was normal. No bleeding. No ulcers. Oropharyngeal area was normal without oral thrush. No exudates.   EYES: Examination revealed normal eyelids and conjunctiva. Pupils about 4 mm, round, equal and reactive to light.   NECK: Supple. Trachea at midline. No thyromegaly. No cervical lymphadenopathy. No masses.   CARDIAC: Heart revealed normal S1 and S2, no S3 or S4. No murmur. No gallop. No carotid bruits.   RESPIRATORY: Normal breathing pattern without use of accessory muscles. No rales. No wheezing.   ABDOMEN: Soft without tenderness. No hepatosplenomegaly. No masses. No hernias.   SKIN: No ulcers. No subcutaneous nodules. She has a hematoma over the right forehead area and other subcutaneous hematomas.   MUSCULOSKELETAL: No joint swelling. No clubbing. The right arm is placed in a sling due to right humerus fracture.   NEUROLOGIC: Cranial nerves II through XII are intact. No focal motor deficit.   PSYCHIATRY: The patient is alert and oriented x3. Mood and affect were normal.   LABS/RADIOLOGIC STUDIES: CAT scan of the head without contrast showed no acute  intracranial abnormalities.   CAT scan of maxillofacial is negative for fracture.  CT scan of the cervical spine is negative for fracture.   X-ray of the right shoulder showed proximal humerus fracture.   Chest x-ray: Lungs appeared clear. No consolidation. No effusion.   Blood work-up showed glucose 173, BNP was elevated at 30,715, BUN 6, creatinine 0.4, sodium was 128, potassium 3.2 and chloride 93. Her calcium 8.1 with albumin of 3.1. Her magnesium was 1.1. Liver function tests were normal, except for low albumin at 3.1. CBC showed white count of 18,000 and hemoglobin 10.9. Her baseline hemoglobin was 10 two months ago, hematocrit 32 and platelet count 320.   ASSESSMENT: 1. Recurrent syncope, etiology is unclear.  2. Right humerus fracture status post fall.  3. Hyponatremia. This needs further work-up.  4. Hypokalemia.  5. Hypomagnesemia. 6. Calcium is low but calculated calcium, according to her albumin, results in a calcium of 8.8, which is fine.  7. Leukocytosis. This is nonspecific and could be from her fall and distress. No evidence of infection.  8. Her other medical problems include diastolic congestive heart failure with ejection fraction of 55%, diabetes mellitus, hypertension, dyslipidemia, gastroesophageal reflux disease, chronic obstructive pulmonary disease, home oxygen dependent on  2 liters, osteoporosis and multiple allergies.   PLAN: We will admit the patient to the medical floor on telemetry monitoring for any arrhythmias. I will check a couple of troponins. Orthopedic consultation regarding her humerus fracture. I will hold the hydrochlorothiazide and continue the Benicar alone. Serum and urine osmolality. Potassium supplementation. Magnesium supplementation. Continue oxygen, 2 liters, and I will continue the rest of her home medications. However, I will hold the hydroxyzine to minimize sedation. The patient indicates that she has a LIVING WILL and her code is FULL CODE,  unless her condition is determined to be incurable.   TIME SPENT: In evaluating this patient and reviewing her medical records took more than 1 hour. ____________________________ Carney CornersAmir M. Rudene Rearwish, MD amd:sb D: 10/09/2012 00:43:11 ET    T: 10/09/2012 07:21:04 ET        JOB#: [id number] cc: Carney CornersAmir M. Rudene Rearwish, MD, <Dictator> Zollie ScaleAMIR M Denis Koppel MD ELECTRONICALLY SIGNED 10/13/2012 21:16

## 2015-02-08 NOTE — Consult Note (Signed)
General Aspect carotid stenosis    Present Illness The patient is an 81 year old female with history of diabetes, hypertension, diastolic congestive heart failure and chronic obstructive pulmonary disease, home oxygen dependent. The patient is suffering from frequent falls and had outpatient work-up which did not reveal any specific etiology. She states that she fell at least 3 times this past week. The patient is vague about these episodes, but she states she gets weak and then she will fall. No reported palpitations. No seizure activity. Today she fell again, landed on her right side and her face. She was brought to the ER. Evaluation here reveals a hematoma at the right forehead and facial area. She has right proximal humerus fracture.  CT angiogram was obtained which showed critical stenosis of the left ICA.  Teh patient denies AF or TIA symptoms.  She does relate that with these spells she does feel like she is blacking out but also notes that she doesn't believe that she has actually lost consciousness.  PAST MEDICAL HISTORY: 1. History of diastolic congestive heart failure with ejection fraction of 55%.  2. Diabetes mellitus type 2.  3. Systemic hypertension. 4. Iron deficiency anemia.  5. Hyperlipidemia.  6. Gastroesophageal reflux disease.  7. Chronic obstructive pulmonary disease, home oxygen dependent, on 2 liters.  8. Peripheral vascular disease with carotid artery disease.  9. Osteoporosis.  10. Transient ischemic attack. 11. Sigmoid diverticulosis. 12. Sjogren???s syndrome. 13. Mitral valve prolapse.   PAST SURGICAL HISTORY: Stent placement to her iliac artery.   Home Medications: Medication Instructions Status  clonidine 0.2 mg oral tablet 1 tab(s) orally 2 times a day Active  Ecotrin Adult Low Strength 81 mg oral delayed release tablet 1 tab(s) orally once a day Active  Ventolin HFA 90 mcg/inh inhalation aerosol 2 puff(s) inhaled 4 times a day, As Needed- for Shortness of  Breath  Active  Dulera 100 mcg-5 mcg/inh inhalation aerosol 2 puff(s) inhaled 2 times a day Active  acetaminophen-hydrocodone 325 mg-5 mg oral tablet 1 tab(s) orally every 6 hours, As Needed- for Pain  Active  hydrALAZINE 50 mg oral tablet 1 tab(s) orally 3 times Active  doxazosin 4 mg oral tablet 1 tab(s) orally once a day (at bedtime) Active  dexlansoprazole 60 mg delayed release capsule 1 cap(s) orally once a day at 5pm Active  gabapentin 100 mg oral capsule 1 cap(s) orally 2 times a day Active  citalopram 20 mg oral tablet 1 tab(s) orally once a day at 12:00 Active  acetaminophen-oxycodone 325 mg-5 mg oral tablet 1 tab(s) orally every 6 hours Active  Benicar HCT 40 mg-25 mg oral tablet 1 tab(s) orally once a day Active  Spiriva 18 mcg inhalation capsule 1 each inhaled once a day Active  Centrum Silver oral tablet 1 tab(s) orally once a day Active  metformin 500 mg oral tablet 1 tab(s) orally 2 times a day Active  Zetia 10 mg oral tablet 1 tab(s) orally once a day (at bedtime) Active  tramadol 50 mg oral tablet 1 tab(s) orally every 4 hours, As Needed Active  Bystolic 10 mg oral tablet 1 tab(s) orally 2 times a day Active  hydrOXYzine hydrochloride 50 mg oral tablet 1 tab(s) orally 3 times a day Active  calcium carbonate 600 mg oral tablet, chewable 1 tab(s) orally 2 times a day Active  Vitamin D3 1000 intl units oral tablet 1 tab(s) orally once a day Active    Singlet: Wheezing  Lisinopril: Itching, Hives  Epinephrine: Itching,  Hives  Advair Diskus: Itching, Hives  Doxycycline: Hives  Glipizide: Hives  Levaquin: Muscles aches, N/V/Diarrhea  Ceclor: Other  Erythromycin: Unknown  Septra: Unknown  Tavist-D: Unknown  Penicillin: Unknown  Darvon: Unknown  Codeine: Unknown  Zocor: Unknown  Forteo: Unknown  Statins: Muscles aches  Lasix: Itching  Case History:   Primary Care Provider [facility]  Dr Jannette Spanner    Family History Non-Contributory    Social  History negative tobacco, negative ETOH, negative Illicit drugs   Review of Systems:   Fever/Chills No    Cough Yes    Sputum No    Abdominal Pain No    Diarrhea No    Constipation No    Nausea/Vomiting No    SOB/DOE Yes    Chest Pain No    Telemetry Reviewed NSR    Dysuria No   Physical Exam:   GEN well developed, well nourished, frail    HEENT pale conjunctivae, PERRL, hearing intact to voice, large hematoma right periorbital    NECK trachea midline  stiff with moderate decreased reange of motion    RESP normal resp effort  no use of accessory muscles    CARD regular rate  positive carotid bruits  no JVD    ABD denies tenderness  denies Flank Tenderness  soft    EXTR negative cyanosis/clubbing, negative edema, right arm in a sling    SKIN No ulcers, skin turgor poor    NEURO cranial nerves intact, follows commands, motor/sensory function intact    PSYCH alert, A+O to time, place, person, good insight   Nursing/Ancillary Notes: **Vital Signs.:   21-Dec-13 09:30   Vital Signs Type Q 4hr   Temperature Temperature (F) 97.8   Celsius 36.5   Pulse Pulse 87   Respirations Respirations 20   Systolic BP Systolic BP 973   Diastolic BP (mmHg) Diastolic BP (mmHg) 73   Mean BP 100   Pulse Ox % Pulse Ox % 95   Pulse Ox Activity Level  At rest   Lab:  20-Dec-13 21:32    pH (ABG) 7.39   PCO2  61   PO2  48   FiO2 36   Base Excess  9.6   HCO3  36.9   O2 Saturation 88.9   O2 Device nasal cannula   Specimen Site (ABG) LT RADIAL   Specimen Type (ABG) ARTERIAL   Patient Temp (ABG) 37.0  Routine Chem:  20-Dec-13 04:50    Sodium, Serum  129 (Result(s) reported on 10 Oct 2012 at 05:23AM.)   Magnesium, Serum  1.5 (1.8-2.4 THERAPEUTIC RANGE: 4-7 mg/dL TOXIC: > 10 mg/dL  -----------------------)    21:32    Result Comment - NOTIFIED OF CRITICAL VALUE  - READ-BACK PROCESS PERFORMED.  - Dr. James Ivanoff on 10/10/12  - @ 21:44  Result(s) reported on 10 Oct 2012 at 09:44PM.  21-Dec-13 03:44    Glucose, Serum  144   BUN 11   Creatinine (comp) 0.77   Sodium, Serum  130   Potassium, Serum  3.4   Chloride, Serum  86   CO2, Serum  35   Calcium (Total), Serum 8.6   Anion Gap 9   Osmolality (calc) 263   eGFR (African American) >60   eGFR (Non-African American) >60 (eGFR values <89m/min/1.73 m2 may be an indication of chronic kidney disease (CKD). Calculated eGFR is useful in patients with stable renal function. The eGFR calculation will not be reliable in acutely ill patients when  serum creatinine is changing rapidly. It is not useful in  patients on dialysis. The eGFR calculation may not be applicable to patients at the low and high extremes of body sizes, pregnant women, and vegetarians.)  Routine UA:  20-Dec-13 11:22    Color (UA) Yellow   Clarity (UA) Hazy   Glucose (UA) Negative   Bilirubin (UA) Negative   Ketones (UA) Negative   Specific Gravity (UA) 1.034   Blood (UA) Negative   pH (UA) 6.0   Protein (UA) 30 mg/dL   Nitrite (UA) Negative   Leukocyte Esterase (UA) Negative (Result(s) reported on 10 Oct 2012 at 11:56AM.)   RBC (UA) 1 /HPF   WBC (UA) 2 /HPF   Bacteria (UA) NONE SEEN   Epithelial Cells (UA) 1 /HPF   Mucous (UA) PRESENT (Result(s) reported on 10 Oct 2012 at 11:56AM.)  Routine Hem:  20-Dec-13 04:50    WBC (CBC)  13.4   RBC (CBC)  3.51   Hemoglobin (CBC)  10.4   Hematocrit (CBC)  31.2   Platelet Count (CBC) 344   MCV 89   MCH 29.6   MCHC 33.3   RDW  14.9   Neutrophil % 84.6   Lymphocyte % 7.4   Monocyte % 7.5   Eosinophil % 0.3   Basophil % 0.2   Neutrophil #  11.4   Lymphocyte # 1.0   Monocyte #  1.0   Eosinophil # 0.0   Basophil # 0.0 (Result(s) reported on 10 Oct 2012 at 05:22AM.)     Impression 1.  Carotid stenosis >80%           Difficult to know whether this is a symptomatic lesion but given her orthopeadic injuries it is clear she has significant balance issues.           CTA shows  the lesion is heavily calcified and there is marked tortuosity which does not favor stenting.  However, her comorbidities may not favor surgery.  She will need to be evaluated by Dr Chancy Milroy (pulmonary) and Dr Rockey Situ (cardiology) and I will discuss the overall situation with Dr Arline Asp. 2.  Syncope           continue to monitor BP and telemetry 3.  CHF           continue home meds          plan per [facility] Dr 4.  Hypertension         continue home meds          changes per [facility] Dr. 5.  COPD         Dr Chancy Milroy to follow 6.  Diabetes          sliding scale          home meds as ordered by Sadie Haber Dr    Plan level 5   Electronic Signatures: Hortencia Pilar (MD)  (Signed 21-Dec-13 16:08)  Authored: General Aspect/Present Illness, Home Medications, Allergies, History and Physical Exam, Vital Signs, Labs, Impression/Plan   Last Updated: 21-Dec-13 16:08 by Hortencia Pilar (MD)

## 2015-02-11 NOTE — Discharge Summary (Signed)
PATIENT NAME:  Catherine Holmes, Catherine Holmes MR#:  [id number] DATE OF BIRTH:  [birth year]  DATE OF ADMISSION:  03/17/2013 DATE OF DISCHARGE:  03/23/2013  DISPOSITION: Discharged to home with home physical therapy.  DISCHARGE DIAGNOSES: 1.  Acute on chronic respiratory failure secondary to congestive heart failure exacerbation, acute on chronic diastolic heart failure.  2.  Chronic obstructive pulmonary disease.  3.  History of chronic anemia.  4.  Diabetes mellitus, type 2.   PATIENT'S DISCHARGE MEDICATIONS: 1.  Doxazosin 4 mg p.o. at bedtime.  2.  Celexa 20 mg p.o. daily.  3.  Neurontin 100 mg p.o. b.i.d.  4.  Nitrostat sublingual as needed for chest pain.  5.  Aldactone 50 mg as needed for swelling daily.  6.  DuoNebs every 6 hours p.r.n. for wheezing.  7.  Hydralazine 10 mg p.o. daily.  8. Nebivolol 5 mg p.o. b.i.d.   9.  Olemesartan 40 mg p.o. daily.  10.  Metformin 500 mg p.o. b.i.d.  11.  .  12.  Bumex 0.5 mg at bedtime.  13.  She is on [place]ul[place]; continue that.   DIET: Low sodium, low fat, ADA diet.   CONSULTATIONS: Cardiology consult with Dr. Dossie Arbourim Gollan, and also GI consult with  Dr. Mechele CollinElliott.   HOSPITAL COURSE: An 81 year old female patient with a history of heart failure with EF of 55%, came in because of leg edema with shortness of breath and orthopnea. Past medical history significant for a history of diastolic heart failure. Follows up with Dr. Mariah MillingGollan. History of chronic respiratory failure, peripheral vascular disease with ICA stenosis 70%, history of diabetes mellitus type 2, hyperlipidemia, GERD, COPD on home oxygen at night, and also a history of Sjogren's syndrome. The patient also has a history of labile hypertension. The patient's BNP on admission was slightly elevated at 2378, and EKG unremarkable. The patient's x-ray showed pulmonary vascular congestion. The patient was admitted for acute on chronic diastolic heart failure exacerbation. THE PATIENT HAS MULTIPLE ALLERGIES, but  started on a small dose of Bumex, and she tolerated that. The patient was kept on salt and fluid restrictions. We continued oxygen; and the patient's symptoms improved  The patient was started on IV Bumex, but she felt too weak the following day, so we changed to Bumex p.o. and the patient's swelling improved, shortness of breath improved, and she is right now on only 0.5 mg of Bumex that she can continue at discharge, and I told her to be cautious with her salt intake. The patient's hypertension improved with her home medications; and nevibolol has been added, 5 mg p.o. b.i.d.; and heart rate is around 60s to 70s.   History of peripheral vascular disease with carotid disease. She is on statins; continue that.   History of COPD: The patient is on Dulera and nebulizers and Spiriva at home. Continue that. The patient has been following with Dr. Freda MunroSaadat Khan, and the patient uses oxygen at night for nocturnal hypoxemia.   Generalized weakness: Seen by Physical Therapy, who recommended home PT. The patient will get home physical therapy at home.   Neuropathy. She is on Neurontin. Continue that.   PERTINENT LAB DATA SINCE ADMISSION: Troponin less than 0.02. EKG: Normal sinus rhythm at 71 beats per minute. BNP 2378. Electrolytes on admission: Sodium 130, potassium 4.2, chloride 95, bicarbonate 29, BUN 13, creatinine 0.57, glucose 129. WBC on admission 7.8, hemoglobin 8.4, hematocrit 26.1, platelets 353.   The patient's iron levels were 23.  Magnesium was 0.9 on  May 28, potassium was 3.3 on May 28, which are being replaced. Repeat magnesium 1.8 on May 29. The patient's potassium also improved to 3.9 on May 29.   Sodium was 129 on May 30, but improved to 130 on May 31. Stool for C. difficile has been negative. Stool occult for blood has been negative. Hemoglobin is stable around 8.9. The patient was seen by GI. Dr. Mechele Collin did not recommend any further workup. No endoscopy at this time. The patient has iron  deficiency anemia, can get iron supplements and follow with GI as needed.   For CHF exacerbation, she was started on Bumex 1 mg IV q. 6 hours, then changed to 0.5 mg daily later on.  The patient can continue potassium while she is on Bumex. Did receive magnesium supplements for her hypomagnesemia.   TIME SPENT ON DISCHARGE PREPARATION: More than 30 minutes.     ____________________________ Katha Hamming, MD sk:dm D: 03/23/2013 08:48:00 ET T: 03/23/2013 10:10:38 ET JOB#: [id number]  cc: Reola Mosher. Randa Lynn, MD Katha Hamming, MD, <Dictator>   Katha Hamming MD ELECTRONICALLY SIGNED 04/06/2013 22:54

## 2015-02-11 NOTE — H&P (Signed)
PATIENT NAME:  Catherine Holmes, Catherine Holmes MR#:  [id number] DATE OF BIRTH:  11-15-1927  DATE OF ADMISSION:  05/26/2013  PRIMARY CARE PHYSICIAN:  Alonna BucklerAndrew Lamb, MD  EMERGENCY ROOM PHYSICIAN:  Lowella FairyJohn Woodruff, MD   CHIEF COMPLAINT: Shortness of breath.   HISTORY OF PRESENT ILLNESS: This is an 81 year old female patient with history of chronic diastolic heart failure, hypertension and diabetes who came in because of shortness of breath which started yesterday. The patient noted to have orthopnea, shortness of breath, PND and pedal edema since yesterday and also weight gain of 4 pounds in 3 days. O2 sat is 85% on room air in the Emergency Room and on 2 liters it is like 92%. The patient also has elevated blood pressure, 208/82 in the Emergency Room. The patient received a dose of IV Bumex, and she feels slightly better. We are admitting her for CHF exacerbation.   PAST MEDICAL HISTORY: Significant for COPD, depression, diabetes, chronic anemia and recent admission in May.  The patient also has a history peripheral vascular disease with internal carotid stenosis of 70%. The patient also has home oxygen, 2 liters at night. Other history is significant for Sjogren syndrome, TIA, osteoporosis and sigmoid diverticulosis.   PAST SURGICAL HISTORY: Stent placement in iliac artery.   SOCIAL HISTORY: The patient is an ex-smoker, quit 30 years ago. No alcohol. No drugs.   FAMILY HISTORY: Father died at the age of 79 from heart attack. Mother died at age 79 because of heart attacks.  MEDICATIONS:  1.  DuoNebs as needed every 6 hours for trouble breathing.  2. Aspirin 81 mg daily.  3.  Bumex 1 mg p.o. daily.  4.  Bystolic 5 mg p.o. daily.  5.  Centrum Silver 1 tablet daily.  6.  Celexa 20 mg at lunch. 7.  Dexilant 60 mg p.o. daily.  8.  Doxazosin 4 mg daily.  9.  Dulera 5/100 mcg inhalation 2 puffs b.i.d.  10.  Ferrous sulfate 320 mg p.o. daily.  11.  Neurontin 100 mg p.o. b.i.d.  12.  Metformin 500 mg p.o. b.i.d.    13.  Nitroglycerin sublingual as needed p.r.n. for chest pain.  14.  Potassium chloride 10 mEq 1/2 tablet p.o. b.i.d.  15.  Spiriva 18 mcg inhalation daily.  16.  Vitamin D 3000 units p.o. daily. 17.  Zetia 10 mg p.o. once a day.  18.  Aldactone 50 mg p.o. as needed for swelling.   ALLERGIES: THE PATIENT HAS MULTIPLE ALLERGIES TO ADVAIR DISKUS, CECLOR, CODEINE, DARVON, DOXYCYCLINE, EPINEPHRINE, ERYTHROMYCIN,  GLIPIZIDE, LASIX, LEVAQUIN, LISINOPRIL, PENICILLINS, SEPTRA.   REVIEW OF SYSTEMS: CONSTITUTIONAL: Has no fever. No fatigue. No weakness. EYES: No blurred vision. No inflammation. ENT: No tinnitus. No epistaxis. No difficulty swallowing.  RESPIRATORY: Does have some cough, shortness of breath since yesterday. The patient has history of COPD.  CARDIOVASCULAR: Feels heaviness in the chest, orthopnea and PND since yesterday. The patient denies any palpitations. No syncope.  GASTROINTESTINAL: No nausea. No vomiting. No abdominal pain.  GENITOURINARY: No dysuria.  ENDOCRINE: No polyuria or nocturia.  INTEGUMENTARY: No skin rashes.  MUSCULOSKELETAL: No joint pain.  NEUROLOGIC: No numbness or weakness.  PSYCHIATRIC: Does have history of depression.   PHYSICAL EXAMINATION:  VITAL SIGNS: Temperature 98 Fahrenheit, heart rate 62, blood pressure 208/82, sats 85% on room air and on 2 liters she has sats around 92%.  GENERAL: She is a well-developed, well-nourished female, not in distress.  HEAD AND EYES:  Head atraumatic, normocephalic. Pupils equally reacting  to light. Extraocular movements are intact.  ENT: The patient has no tympanic membrane congestion. No external auditory lesions. No turbinate hypertrophy. Has oxygen via nasal cannula.  Throat:  No oropharyngeal erythema.  NECK: Supple. No thyroid enlargement. No JVD. No lymphadenopathy. No carotid bruit.  HEART:  S1 and S2 regular. The patient has no murmurs.  LUNGS: Basilar crepitations present. No wheezing. Not using accessory  muscles for respiration.  ABDOMEN: Soft. Bowel sounds present. Nontender, nondistended. No CVA tenderness. No hernias.  EXTREMITIES: 1+ pitting edema present bilaterally up to the knees.  NEUROLOGIC: Alert, awake, oriented. No focal neurological deficit. Cranial nerves II through XII intact. Power 5 out of 5 in upper and lower extremities.  Sensation intact. DTRs 2+ bilaterally.  PSYCHIATRIC: Mood and affect are within normal limits.   LABORATORY AND DIAGNOSTICS:  WBC 6.3, hemoglobin 8.1, hematocrit 32.6, platelets 333. BNP 1611.  BMP: Sodium 131, potassium 4.3, chloride 93, bicarb 34, BUN 9, creatinine 0.70, glucose 118. Troponin less than 0.02.   EKG shows normal sinus rhythm with 63 beats per minute. Chest x-ray shows cardiomegaly and also some hazy density in the right lung representing some soft tissue versus atelectasis.  ASSESSMENT AND PLAN:   1.  An 81 year old female patient with acute respiratory failure secondary to congestive heart failure exacerbation. The patient is going to be admitted to telemetry and will start her on Bumex. She is allergic to Lasix so continue Bumex. Continue daily weights and also in and outs. Also continue Aldactone.  2.  Malignant hypertension. The patient is on Bumex and Aldactone. She is on hydralazine at home. We will continue that along with p.r.n. hydralazine and also resume Bystolic that she is on.  3.  Chronic obstructive pulmonary disease. The patient has no wheezing at this time. Continue DuoNebs p.r.n. along with her Spiriva and Dulera.  4.  Diabetes mellitus type 2. Continue sliding scale coverage and metformin. 5.  History of diabetic neuropathy. She is on Neurontin. Continue that.  6.  Chronic iron deficiency anemia. She is on iron supplements.  The patient's hemoglobin last time, in May, was around 8.4 so the patient has chronic iron deficiency anemia. Continue to replace the iron and monitor for any blood loss.  7.  Regarding her congestive heart  failure, the patient will get an echocardiogram.  TIME SPENT:  About 55 minutes.  ____________________________ Katha Hamming, MD sk:sb D: 05/26/2013 13:34:46 ET T: 05/26/2013 14:20:50 ET JOB#: [id number]  cc: Katha Hamming, MD, <Dictator> Katha Hamming MD ELECTRONICALLY SIGNED 06/09/2013 17:27

## 2015-02-11 NOTE — Consult Note (Signed)
CC: anemia, heme positive stool. Hgb stable at 8.7, sat 95% on 2L,  had bowel movement no melena or BRB.  Chest exam with insp rales esp on right base,  She says that when she breaths deep it hurts some in stomach.  K 3.3, Mg 0.9 getting supplement iv today.  Due to CHF no plans for EGD at this time.  Will follow, please get daily CBC tests.  Electronic Signatures: Scot JunElliott, Robert T (MD)  (Signed on 28-May-14 11:27)  Authored  Last Updated: 28-May-14 11:27 by Scot JunElliott, Robert T (MD)

## 2015-02-11 NOTE — Consult Note (Signed)
General Aspect 81 year old woman with a history of DM,  peripheral vascular disease, 60 to 70% carotid arterial disease, moderate arterial disease of the lower extremities, status post bilateral iliac stenting, moderate renal stenosis, hyperlipidemia, history of severe  hypertension with labile blood pressures, history of episodic shortness of breath and chest squeezing relieved with albuterol/nebs who presents for worsening SOB, edema, severe HTN.  She has a sulfa allergy to loop diuretics. History of falls.   She reports worsening edema especially since wednesday. Daughter pushing fluids. Taking spironolactone recently with no relief. Reluctant to take bumex as it causes a rash, incontinence. Daughter out of town, back on Monday. Patient wants to go home on Monday.   in the hospital at [facility] in 2012 for general malaise, severe hypertension, shortness of breath, chest pain. She had a stress test, lexiscan that showed no significant ischemia. Significant shortness of breath with lexiscan.   hospital admission on 01/08/2012 for shortness of breath and syncope. She was found to have significant anemia. Treated with Lasix for diastolic CHF.  Underlying severe left carotid arterial disease estimated at 70%.   Prior followup with Dr. Humphrey Rolls in pulmonary with numerous CT scans, MRIs, PFTs, sleep study. She was told that she had hypoxemia overnight and was started on nighttime oxygen.  Recent episode where she was bending down to get treats to her dogs. She felt dizzy and fell onto her right face and right arm. Fractures to her arms. Hospitalization for workup showing very low potassium of 3.2, sodium in the high 120 range. Etiology of her fall was thought secondary to low blood pressure, unable to exclude other etiology. HCTZ was held for low potassium and low sodium.  Discharged to [facility] for rehabilitation.  BP meds at home:  She's not taking clonidine, she takes one half Benicar, sometimes a full pill,  takes hydralazine 25 mg 4 times a day, Cardura 4 mg at nighttime.   Present Illness . PAST SURGICAL HISTORY: Stent placement to iliac artery.   SOCIAL HABITS: Ex-chronic smoker. She quit 13 years ago. No history of alcohol or drug abuse.   SOCIAL HISTORY: The patient is divorced. She lives at home with her daughter.   FAMILY HISTORY: Her father died at the age of 52 from a heart attack. Mother died at the age of 34 from a heart attack. She had a sister who died from a heart attack at the age of 41.   Physical Exam:  GEN well developed, well nourished, no acute distress   HEENT hearing intact to voice, moist oral mucosa   NECK supple  No masses    RESP normal resp effort  clear BS    CARD Regular rate and rhythm  Murmur    Murmur Systolic    Systolic Murmur Out flow    ABD denies tenderness  soft    LYMPH negative neck   EXTR negative edema   SKIN normal to palpation   NEURO motor/sensory function intact   PSYCH alert, A+O to time, place, person, good insight   Review of Systems:  Subjective/Chief Complaint SOB, edema, sore legs   General: Fatigue  Weakness    Skin: No Complaints    ENT: No Complaints    Eyes: No Complaints    Neck: No Complaints    Respiratory: Short of breath    Cardiovascular: Dyspnea    Gastrointestinal: No Complaints    Genitourinary: No Complaints    Vascular: No Complaints    Musculoskeletal: legs weak  Neurologic: No Complaints    Hematologic: No Complaints    Endocrine: No Complaints    Psychiatric: No Complaints    Review of Systems: All other systems were reviewed and found to be negative    Medications/Allergies Reviewed Medications/Allergies reviewed      Hiatal Hernia: large per barium study results   Mitral Valve Prolapse:    Emphysema:    TIA - Transient Ischemic Attack:    Arthrolgias:    CHF:    sjogens syndrome:    Hyperlipidemia:    Tic [place]re[place]:    Carotid Artery Dz:    CAD:     osteoporosis:    COPD:    gerd:    Diabetes Mellitus, Type II (NIDD):    htn:   Home Medications: Medication Instructions Status  doxazosin 4 mg oral tablet 1 tab(s) orally once a day (at bedtime) Active  citalopram 20 mg oral tablet 1 tab(s) orally once a day Active  gabapentin 100 mg oral capsule 1 cap(s) orally 2 times a day Active  Nitrostat 0.4 mg sublingual tablet 1 tab(s) sublingual every 5 minutes, As Needed - for Chest Pain Active  spironolactone 50 mg oral tablet 1 tab(s) orally once a day, As Needed for swelling Active   Lab Results:  Routine Chem:  28-May-14 04:02   Magnesium, Serum  0.9 (1.8-2.4 THERAPEUTIC RANGE: 4-7 mg/dL TOXIC: > 10 mg/dL  -----------------------)  Glucose, Serum 90  BUN 13  Creatinine (comp) 0.71  Sodium, Serum  132  Potassium, Serum  3.3  Chloride, Serum  90  CO2, Serum  37  Calcium (Total), Serum 8.5  Anion Gap  5  Osmolality (calc) 264  eGFR (African American) >60  eGFR (Non-African American) >60 (eGFR values <30m/min/1.73 m2 may be an indication of chronic kidney disease (CKD). Calculated eGFR is useful in patients with stable renal function. The eGFR calculation will not be reliable in acutely ill patients when serum creatinine is changing rapidly. It is not useful in  patients on dialysis. The eGFR calculation may not be applicable to patients at the low and high extremes of body sizes, pregnant women, and vegetarians.)  Routine Hem:  28-May-14 04:02   Hemoglobin (CBC)  8.7 (Result(s) reported on 18 Mar 2013 at 05:24AM.)   EKG:  Interpretation EKG shows NSR with rate 71 bpm, no significant ST or T wave changes   Radiology Results: XRay:    26-May-14 22:59, Chest PA and Lateral  Chest PA and Lateral   REASON FOR EXAM:    sob  COMMENTS:       PROCEDURE: DXR - DXR CHEST PA (OR AP) AND LATERAL  - Mar 16 2013 10:59PM     RESULT:     Comparison is made to a prior study dated 01/20/2013.    The patient has taken a  shallow inspiration. With technique takeninto   consideration, there is mild prominence of the interstitial markings. No   focal regions of consolidation or focal infiltrates appreciated. The   cardiac silhouette is moderately enlarged. Area of increased density   projects in the retrocardiac region and statistically likely representing   a hiatal hernia. This finding appears stable when compared to the     previous study. The visualized bony skeleton demonstrates a chronic   compression deformity within the mid thoracic spine.    IMPRESSION:  Prominent interstitial markings which may represent a   component of mild pulmonary edema. No focal regions of consolidation or   focal infiltrates.  Thank you for this opportunity to contribute to the care of your patient.       Verified By: Mikki Santee, M.D., MD    Singlet: Wheezing  Advair Diskus: Itching, Hives  Doxycycline: Hives  Glipizide: Hives  Lisinopril: Itching, Hives  Epinephrine: Itching, Hives  Levaquin: Muscles aches, N/V/Diarrhea  Ceclor: Other  Erythromycin: Unknown  Septra: Unknown  Tavist-D: Unknown  Penicillin: Unknown  Darvon: Unknown  Codeine: Unknown  Zocor: Unknown  Forteo: Unknown  Statins: Muscles aches  Lasix: Itching  Vital Signs/Nurse's Notes: **Vital Signs.:   28-May-14 07:01  Vital Signs Type Routine  Temperature Temperature (F) 98.3  Celsius 36.8  Temperature Source oral  Pulse Pulse 70  Respirations Respirations 23  Pulse Ox % Pulse Ox % 85  Oxygen Delivery 2L  Pulse Ox Heart Rate 61    Impression 81 year old woman with a history of DM,  peripheral vascular disease, 60 to 70% carotid arterial disease, moderate arterial disease of the lower extremities, status post bilateral iliac stenting, moderate renal stenosis, hyperlipidemia, history of severe  hypertension with labile blood pressures, history of episodic shortness of breath and chest squeezing relieved with albuterol/nebs  who presents for worsening SOB, edema, severe HTN.  1) SOB: acute on chronic diastolic CHF Only taking spironolactone at home with no benefit Scared to take bumex as she has had sulfs rash in the pastr on bumex and lasix. --Currently on bumex IV and PO (will hold po), change bumex IV to BID replete magn esium and potassium  2) HTN: improved on her outpt medications.  3) PVD: known carotid disease, LE arterial disease.  4) asthma.copd takes albuterol prn for SOB  5) Dispo: legs weak, needs PT daughter not back in town until Monday ("I need help at home")   Electronic Signatures: Ida Rogue (MD)  (Signed 28-May-14 09:58)  Authored: General Aspect/Present Illness, History and Physical Exam, Review of System, Past Medical History, Home Medications, Labs, EKG , Radiology, Allergies, Vital Signs/Nurse's Notes, Impression/Plan   Last Updated: 28-May-14 09:58 by Ida Rogue (MD)

## 2015-02-11 NOTE — Consult Note (Signed)
CC: CHF, pt feeling better after diuresis.  BNP elevated, anemia, heme positive stool.  Anemia may and may not be related to heme positive stool.  She has not had a colonoscopy in several years,  EGD  a few years ago.  Await cardiology imput as to pt being a candidate for sedated endoscopic procedures.  See NP note for complete consult.  Electronic Signatures: Scot JunElliott, Anajulia Leyendecker T (MD)  (Signed on 27-May-14 17:13)  Authored  Last Updated: 27-May-14 17:13 by Scot JunElliott, Brodie Scovell T (MD)

## 2015-02-11 NOTE — H&P (Signed)
PATIENT NAME:  Catherine Holmes, Catherine Holmes MR#:  [id number] DATE OF BIRTH:  Feb 12, 1928  DATE OF ADMISSION:  03/17/2013  PRIMARY CARE PHYSICIAN: Dr. Alonna BucklerAndrew Lamb.   REFERRING PHYSICIAN: Suella BroadLinda Taylor.   CHIEF COMPLAINT: Increased shortness of breath.   HISTORY OF PRESENT ILLNESS: Catherine Holmes is an 81 year old Caucasian female with history of chronic diastolic congestive heart failure with ejection fraction of 55%, history of chronic respiratory failure, severe systemic hypertension and diabetes mellitus. She was in her usual state of health until about Friday, that is 4 days ago, when she started to notice increased bilateral leg edema associated with increased exertional dyspnea which has progressed to have dyspnea at rest as well. The patient states that today she could not make a sentence or statement without stopping talking to have frequent breathing. Finally, she decided to come to the hospital for further evaluation, and here, findings are consistent with volume overload and decompensated acute congestive heart failure on chronic congestive heart failure. Her chest x-ray showed cardiomegaly and pulmonary vascular congestion. Additionally, noticed to have anemia. Her hemoglobin baseline in February of this year was 4711. It dropped now to 8. She does not have any abdominal pain. No hematochezia. No melena; however, her stool occult was positive.   REVIEW OF SYSTEMS:  CONSTITUTIONAL: Denies any fever. No chills, but she feels mild fatigue.  EYES: No blurring of vision. No double vision.  ENT: No hearing impairment. No sore throat. No dysphagia.  CARDIOVASCULAR: No chest pain but has exertional shortness of breath, progressed to have dyspnea at rest as well and she has increased peripheral edema. No syncope.  RESPIRATORY: Shortness of breath as above. No cough. No hemoptysis. No chest pain.  GASTROINTESTINAL: No abdominal pain. No vomiting. No diarrhea. No hematochezia. No melena.  GENITOURINARY: No dysuria. No  frequency of urination.  MUSCULOSKELETAL: No joint pain or swelling. No muscular pain or swelling.  INTEGUMENTARY: No skin rash. No ulcers.  NEUROLOGY: No focal weakness. No seizure activity. No headache.  PSYCHIATRY: No anxiety. No depression.  ENDOCRINE: No polyuria or polydipsia. No heat or cold intolerance.   PAST MEDICAL HISTORY: Chronic diastolic congestive heart failure with ejection fraction of 55%. She is followed up by Dr. Mariah MillingGollan. History of chronic respiratory failure, severe systemic hypertension, peripheral vascular disease with left internal carotid stenosis about 70%, diabetes mellitus type 2, iron deficiency anemia, hyperlipidemia, gastroesophageal reflux disease, chronic obstructive pulmonary disease home oxygen dependent on 2 liters, osteoporosis, history of transient ischemic attack, sigmoid diverticulosis, mitral valve prolapse and Sjogren syndrome.   PAST SURGICAL HISTORY: Stent placement to iliac artery.   SOCIAL HABITS: Ex-chronic smoker. She quit 13 years ago. No history of alcohol or drug abuse.   SOCIAL HISTORY: The patient is divorced. She lives at home with her daughter.   FAMILY HISTORY: Her father died at the age of 79 from a heart attack. Mother died at the age of 79 from a heart attack. She had a sister who died from a heart attack at the age of 79.   ADMISSION MEDICATIONS: Benicar 40/HCTZ 25 that she takes 1/2 tablet in the morning, hydralazine 25 mg 4 times a day, metformin 500 mg twice a day, calcium supplementation and vitamin D 1000 units once a day, Spiriva 1 inhalation once a day, citalopram 20 mg once a day, gabapentin 100 mg twice a day, aspirin 80 mg once a day, Dulera 2 puffs twice a day, doxazosin 4 mg at bedtime, tramadol 50 mg 4 times a day as  needed.   ALLERGIES: REPORTED MULTIPLE ALLERGIES. ATORVASTATIN, CEFACLOR, CODEINE, DOXYCYCLINE, EPINEPHRINE, ERYTHROMYCIN, GLIPIZIDE, LEVOFLOXACIN, LISINOPRIL, PENICILLIN, CRESTOR, SIMVASTATIN, BACTRIM. ALSO,  LASIX CAUSING ITCHING.   PHYSICAL EXAMINATION:  VITAL SIGNS: Blood pressure 193/92, respiratory rate 20, pulse 80, temperature 97.7, oxygen saturation 94%.  GENERAL APPEARANCE: Elderly female lying in bed in no acute distress.  HEAD AND NECK: No pallor. No icterus. No cyanosis. Ear examination revealed normal hearing, no discharge, no lesions. Examination of the nose revealed no discharge, no bleeding, no ulcers. Oropharyngeal examination revealed no oral thrush, no exudate. Eye examination revealed normal eyelids and conjunctivae. Pupils about 4 to 5 mm, equal and sluggishly reactive to light. Neck is supple. Trachea at midline. No thyromegaly. No cervical lymphadenopathy. No masses.  HEART: Normal S1, S2. No S3, S4. No murmur. No gallop. No carotid bruits.  LUNGS: Inflated chest. Decreased breathing sounds bilaterally. Fine inspiratory crackles at the bases on both sides. Resonant to percussion.  ABDOMEN: Soft without tenderness. No hepatosplenomegaly. No masses. No hernias.  SKIN: No ulcers. No subcutaneous nodules.  MUSCULOSKELETAL: No joint swelling. No clubbing.  NEUROLOGIC: Cranial nerves II through XII were intact. No focal motor deficit.  PSYCHIATRY: The patient is alert and oriented x3. Mood and affect were normal.   LABORATORY FINDINGS AND RADIOLOGIC FINDINGS: Chest x-ray showed cardiomegaly and increased pulmonary vascular congestion. EKG showed normal sinus rhythm at rate of 70 per minute. Otherwise unremarkable EKG. B-type natriuretic peptide was elevated at 2378. Glucose 129, BUN 13, creatinine 0.5, sodium 130, potassium 4.3. Normal liver function tests and transaminases. Troponin less than 0.02. CBC showed white count of 7000, hemoglobin 8.4, hematocrit 26, platelet count 353. Her hemoglobin in February of this year was 11.9.   ASSESSMENT:  1. Acute on chronic decompensated diastolic congestive heart failure. Her previous ejection fraction was 55%.  2. Worsening anemia. Baseline  hemoglobin 11.9 in February. Now, it is down to 8.4.  3. Hyponatremia secondary to volume overload.  4. Stool Hemoccult positive, indicating underlying occult gastrointestinal bleed accounting for her anemia.  5. Severe systemic hypertension.  6. Chronic respiratory failure, home oxygen dependent on 2 liters.  7. Diabetes mellitus, type 2.  8. Gastroesophageal reflux disease.  9. Peripheral vascular disease and carotid stenosis.   PLAN: Will admit the patient to the medical floor. IV diuresis using Bumex 1 mg q.6 hours for a total of 6 doses, then re-evaluate. Repeat basic metabolic profile tomorrow. Salt and fluid restriction. The patient is already educated regarding fluid restriction and salt and daily weight. Blood pressure control. Continue oxygen supplementation. Monitor hemoglobin. GI consultation. For deep vein thrombosis prophylaxis, will place her on TED stockings and pneumatic compression. The patient indicates that she has a Living Will and her code status is FULL CODE unless she has an irreversible condition; then she does not want to be on life support indefinitely.   Time spent in evaluating this patient took more than 1 hour.    ____________________________ Carney Corners. Rudene Re, MD amd:gb D: 03/17/2013 04:11:57 ET T: 03/17/2013 04:53:48 ET JOB#: [id number]  cc: Carney Corners. Rudene Re, MD, <Dictator> Karolee Ohs Dala Dock MD ELECTRONICALLY SIGNED 03/17/2013 6:42

## 2015-02-11 NOTE — Consult Note (Signed)
General Aspect Catherine Holmes is a 81 y.o. female with PMHx s/f chronic respiratory failure, HFpEF (EF 74% on 2012 Myoview), COPD (nighttime home O2), PVD (s/p iliac stent), carotid artery disease (R 40-59%, L 60-79%), type 2 DM, HTN, HLD, GERD and h/o Sjogren???s syndrome who was admitted at [facility] yesterday for acute on chronic diastolic CHF.  Last seen in the office in 03/2013 for follow-up for A/C respiratory failure 2/2 CHF decompensation. Weight had remained stable at 155 lbs on Bumex (Lasix allergy). Non-pharmacologic CHF management strategies were stressed. She had remained stable. There were questions regarding dietary indiscretion and confusion about correct Bumex dose last month. She had + FOBT last admission, seen by GI. EGD/colo not performed at that time. GI requested cards eval prior to sedation. Patient reports following up with GI, felt to be stable, no further work-up planned.   She had been feeling generally well until ~ 1 week ago. She began experiencing worsening shortness of breath and LE edema. Weight increased 4-5 lbs (baseline weight 150-155 lbs). She denies elevated BP, fevers, chills or active bleeding. She has been compliant with all medications including Bumex. Denies dietary indiscretion.   Present Illness In the ED, EKG revealed NSR, TWIs V1, V2, no ST/T changes. BNP 1611. CXR w/ mild cardiomegaly, no evidence of edema. CMET unremarkable. CBC revealed a microcytic anemia- Hgb 8/Hct ~25, MCV 73. Trop-I WNL x 3. BP in the ED 208/82. RR 24. Pulse ox 85% on RA. She received Bumex  IV x 2. I/O - 2350 mL. Weight 160 lbs-->154 lbs. Normotensive this AM. Admitted by the medicine service. 2D echo pending.   PAST SURGICAL HISTORY: Stent placement to iliac artery.   SOCIAL HABITS: Ex-chronic smoker. She quit 13 years ago. No history of alcohol or drug abuse.   SOCIAL HISTORY: The patient is divorced. She lives at home with her daughter.   FAMILY HISTORY: Her father died at the age of  46 from a heart attack. Mother died at the age of 65 from a heart attack. She had a sister who died from a heart attack at the age of 5.   Physical Exam:  GEN no acute distress   HEENT pale conjunctivae, hearing intact to voice   NECK supple  trachea midline  JVP 8 cm   RESP speaking in short sentences, mildly increased respiratory effort, use of strap muscles to breathe, fine bibasilar rales appreciated, no appreciable wheezing or rhonchi   CARD Regular rate and rhythm  Normal, S1, S2  II/VI systolic crescendo-decrescendo murmur at RUSB   ABD denies tenderness  soft  normal BS   EXTR negative cyanosis/clubbing, trace bilateral pitting pretibial edema   SKIN No rashes   NEURO follows commands, motor/sensory function intact   PSYCH alert, A+O to time, place, person   Review of Systems:  Subjective/Chief Complaint SOB   General: Fatigue   Respiratory: Short of breath   Cardiovascular: Tightness  Dyspnea  Orthopnea  Edema   Gastrointestinal: epigastric band-like tightness   Genitourinary: No Complaints  no frank hematuria   Musculoskeletal: No Complaints   Review of Systems: All other systems were reviewed and found to be negative     Hiatal Hernia: large per barium study results   Mitral Valve Prolapse:    Emphysema:    TIA - Transient Ischemic Attack:    Arthrolgias:    CHF:    sjogens syndrome:    Hyperlipidemia:    Tic [place]:    Carotid Artery Dz:  CAD:    osteoporosis:    COPD:    gerd:    Diabetes Mellitus, Type II (NIDD):    htn:   Home Medications: Medication Instructions Status  doxazosin 4 mg oral tablet 1 tab(s) orally once a day (at bedtime) Active  citalopram 20 mg oral tablet 1 tab(s) orally once a day at lunch Active  gabapentin 100 mg oral capsule 1 cap(s) orally 2 times a day at lunch and at bedtime Active  metFORMIN 500 mg oral tablet 1 tab(s) orally 2 times a day (with meals) Active  Nitrostat 0.4 mg sublingual  tablet 1 tab(s) sublingual every 5 minutes, As Needed - for Chest Pain Active  spironolactone 50 mg oral tablet 1 tab(s) orally once a day, As Needed for swelling Active  albuterol-ipratropium 2.5 mg-0.5 mg/3 mL inhalation solution 3 milliliter(s) inhaled every 6 hours, As Needed - for Shortness of Breath Active  bumetanide 1 mg oral tablet 1 tab(s) orally once a day Active  Zetia 10 mg oral tablet 1 tab(s) orally once a day (at bedtime) Active  hydrALAZINE 50 mg oral tablet 1 tab(s) orally 2 times a day (may take an additional 0.5 tablet twice a day as needed) Active  Bystolic 5 mg oral tablet 1 tab(s) orally once a day Active  Vitamin D3 1000 intl units oral tablet 1 tab(s) orally once a day Active  Spiriva 18 mcg inhalation capsule 1 each inhaled once a day Active  Centrum Silver Therapeutic Multiple Vitamins with Minerals oral tablet 1 tab(s) orally once a day Active  Calcium 600+D 600 mg-200 units oral tablet 1 tab(s) orally 2 times a day Active  Dulera 5 mcg-100 mcg/inh inhalation aerosol 2 puff(s) inhaled 2 times a day Active  Tylenol PM 500 mg-25 mg oral tablet 1 tab(s) orally once a day (at bedtime) Active  Dexilant 60 mg oral delayed release capsule 1 cap(s) orally once a day (in the evening) Active  ferrous sulfate 325 mg oral tablet 1 tab(s) orally every other day Active  potassium chloride 10 mEq oral tablet, extended release 0.5 tab(s) orally 2 times a day Active  Aspirin Low Dose 81 mg oral delayed release tablet 1 tab(s) orally once a day at lunch Active  PreserVision Antioxidant Multiple Vitamins and Minerals oral tablet 1 tab(s) orally 2 times a day Active   Lab Results:  Routine Chem:  05-Aug-14 11:09   B-Type Natriuretic Peptide [facility])  1611 (Result(s) reported on 26 May 2013 at 11:40AM.)  Cardiac:  05-Aug-14 11:09   Troponin I < 0.02 (0.00-0.05 0.05 ng/mL or less: NEGATIVE  Repeat testing in 3-6 hrs  if clinically indicated. >0.05 ng/mL: POTENTIAL  MYOCARDIAL  INJURY. Repeat  testing in 3-6 hrs if  clinically indicated. NOTE: An increase or decrease  of 30% or more on serial  testing suggests a  clinically important change)    14:31   CK, Total 57  CPK-MB, Serum 2.2 (Result(s) reported on 26 May 2013 at 03:04PM.)    18:29   CK, Total 55  CPK-MB, Serum 1.7 (Result(s) reported on 26 May 2013 at 06:58PM.)  Troponin I < 0.02 (0.00-0.05 0.05 ng/mL or less: NEGATIVE  Repeat testing in 3-6 hrs  if clinically indicated. >0.05 ng/mL: POTENTIAL  MYOCARDIAL INJURY. Repeat  testing in 3-6 hrs if  clinically indicated. NOTE: An increase or decrease  of 30% or more on serial  testing suggests a  clinically important change)  06-Aug-14 04:03   CK, Total 48  CPK-MB, Serum  1.0 (Result(s) reported on 27 May 2013 at 04:32AM.)  Troponin I < 0.02 (0.00-0.05 0.05 ng/mL or less: NEGATIVE  Repeat testing in 3-6 hrs  if clinically indicated. >0.05 ng/mL: POTENTIAL  MYOCARDIAL INJURY. Repeat  testing in 3-6 hrs if  clinically indicated. NOTE: An increase or decrease  of 30% or more on serial  testing suggests a  clinically important change)  Routine Hem:  05-Aug-14 11:09   Hemoglobin (CBC)  8.1  Hematocrit (CBC)  25.6  06-Aug-14 04:03   Hemoglobin (CBC)  8.0  Hematocrit (CBC)  25.5   EKG:  Interpretation NSR, TWIs V1, V2 no ST/T changes   EKG Comparision Not changed from  03/2013 office tracing   Additional Comments telemetry reveals short, self-limited episode of SVT overnight   Radiology Results: XRay:    05-Aug-14 11:04, Chest Portable Single View  Chest Portable Single View   REASON FOR EXAM:    Shortness of Breath  COMMENTS:       PROCEDURE: DXR - DXR PORTABLE CHEST SINGLE VIEW  - May 26 2013 11:04AM     RESULT: Comparison is made to the study of 03/16/2013. The heart is   enlarged. There is no edema, effusion, mass or pneumothorax. There is   hazy increased density at the right lung base laterally as noted   previously. The  bony structures appear unremarkable.    IMPRESSION:  Mild cardiomegaly. Hazy density laterally at the right lung   base may represent superimposition of soft tissues, skinfold or   atelectasis.    Dictation Site: 2    Verified By: Elveria Royals, M.D., MD    Singlet: Wheezing  Advair Diskus: Itching, Hives  Doxycycline: Hives  Glipizide: Hives  Lisinopril: Itching, Hives  Epinephrine: Itching, Hives  Levaquin: Muscles aches, N/V/Diarrhea  Ceclor: Other  Erythromycin: Unknown  Septra: Unknown  Tavist-D: Unknown  Penicillin: Unknown  Darvon: Unknown  Codeine: Unknown  Zocor: Unknown  Forteo: Unknown  Statins: Muscles aches  Lasix: Itching  Vital Signs/Nurse's Notes: **Vital Signs.:   06-Aug-14 07:41  Vital Signs Type Routine  Temperature Temperature (F) 97.9  Celsius 36.6  Pulse Pulse 57  Systolic BP Systolic BP 149  Diastolic BP (mmHg) Diastolic BP (mmHg) 71  Mean BP 97  Pulse Ox % Pulse Ox % 97  Pulse Ox Activity Level  At rest  Oxygen Delivery 2L  *Intake and Output.:   Daily 06-Aug-14 07:00  Grand Totals Intake:   Output:  2350    Net:  -2350 24 Hr.:  -2350  Urine ml     Out:  2350  Length of Stay Totals Intake:   Output:  2350    Net:  -2350    Impression 81 y.o. female with PMHx s/f chronic respiratory failure, HFpEF (EF 74% on 2012 Myoview), COPD (nighttime home O2), PVD (s/p iliac stent), carotid artery disease (R 40-59%, L 60-79%), type 2 DM, HTN, HLD, GERD and h/o Sjogren???s syndrome who was admitted at [facility] yesterday for acute on chronic diastolic CHF.  1. Acute on chronic diastolic CHF  worsening SOB (NYHA class III->IV symptoms), LE edema and weight gain of ~ 5 lbs.  Suspect secondary to anemia, aortic valve stenosis, diastolic CHF, COPD improving on bumex IV -- Continue bumex IV BID   --Needs either iron infusion or PRBC adn iron for anemia  check retic count, Could try epo.   Plan 2) Anemia likely a major contributing factor. Need  hematology? iron/PRBC or epo Continued anemia will  lead to recurrant admissions for diastolic CHF  3. Acute on chronic respiratory failure/COPD -- Continued management per primary team   4. Hypertension Well-controlled currently. Continue antihypertensives.  -- Will need to ensure BP well-controlled in the outpatient setting  5. Hyperlipidemia -- Statin intolerant   Electronic Signatures: Gery Prayrguello, Lou Irigoyen A (PA-C)  (Signed 06-Aug-14 10:34)  Authored: General Aspect/Present Illness, History and Physical Exam, Review of System, Past Medical History, Home Medications, Labs, EKG , Radiology, Allergies, Vital Signs/Nurse's Notes, Impression/Plan Julien NordmannGollan, Timothy (MD)  (Signed 06-Aug-14 19:57)  Authored: Impression/Plan  Co-Signer: General Aspect/Present Illness, Home Medications, Allergies, Impression/Plan   Last Updated: 06-Aug-14 19:57 by Julien NordmannGollan, Timothy (MD)

## 2015-02-11 NOTE — Consult Note (Signed)
JX:[id number]:diarrhea, had 4 loose stools today.no vomiting, hgb stable at 8.9, Na 129, WBC 7.3.  Pt complains of nasal oxygen irritating nose so will get it humidified.  Will check stool for C, diff.  Electronic Signatures: Scot JunElliott, Robert T (MD)  (Signed on 30-May-14 19:44)  Authored  Last Updated: 30-May-14 19:44 by Scot JunElliott, Robert T (MD)

## 2015-02-11 NOTE — Consult Note (Signed)
Pt CC is CHF.  she feels washed out, weak, achy all over like with the flu.  No vomiting, no diarrhea.  chest clear ant fields.  Na 130, 05% on 2L, VSS afebrile.  No endo plans at this time.  Electronic Signatures: Scot JunElliott, Robert T (MD)  (Signed on 29-May-14 17:28)  Authored  Last Updated: 29-May-14 17:28 by Scot JunElliott, Robert T (MD)

## 2015-02-11 NOTE — Discharge Summary (Signed)
PATIENT NAME:  Catherine Holmes, Catherine Holmes MR#:  [id number] DATE OF BIRTH:  [birth year]  DATE OF ADMISSION:  05/26/2013 DATE OF DISCHARGE:    DISCHARGE DIAGNOSES: 1.  Acute respiratory failure, that is improved.  2.  Acute on chronic diastolic heart failure.   3.  Chronic anemia.  4.  Other chronic medical issues unchanged.   DISCHARGE MEDICATIONS: 1.  Gabapentin 100 mg tablets 2 tabs at lunchtime and at bedtime.  2.  Doxazosin 4 mg p.o. at bedtime.  3.  Citalopram 20 mg p.o. daily.  4.  Nitrostat sublingual 0.4 mg q. 5 minutes as needed for chest pain x3.  5.  Metformin 500 mg p.o. b.i.d. with meals.  6.  Albuterol and ipratropium 2.5/0.5 3 mL nebulizer treatments every 6 hours as needed for dyspnea.  7.  Zetia 10 mg p.o. at bedtime.  8.  Hydralazine 50 mg p.o. b.i.d. additional 0.5 tabs twice a day as needed per Cardiology.  9.  Bystolic 5 mg p.o. daily.  10.  Vitamin D3 as directed.  11.  Spiriva 18 mcg 1 inhalation daily.  12.  Centrum as directed.  13.  Calcium vitamin D as directed. 14.  Dulera 5 mcg/100 two puffs b.i.d.  15.  Tylenol 500 mg at bedtime as needed.  16.  Dexilant 60 mg p.o. at bedtime.  17.  Ferrous sulfate 325 mg p.o. daily.  18.  Potassium chloride 10 mEq half tab p.o. b.i.d. May increase to 1 full tablet as needed if low potassium.  19. Aspirin 81 mg p.o. daily.  20.  PreserVision as directed.  21.  Spironolactone 50 mg p.o. daily.  22.  Bumetanide 2 mg p.o. b.i.d. This is the medication that has been increased.    CONSULTATIONS: Cardiology per Dr. Mariah Milling.   PROCEDURES: The patient underwent echocardiogram on 05/27/2013 that did show EF of 55% to 60% with decreased LV relaxation. It did show increased pulmonary arterial pressures consistent with diastolic congestive heart failure.   PERTINENT LABS:  On day of discharge, hemoglobin 8.2, sodium 132, potassium 3.4, creatinine 0.81. Her weight on the day of discharge is 153.9.   BRIEF HOSPITAL COURSE: Problems:  1.   Acute respiratory failure: The patient initially came in with acute respiratory failure consistent with acute on chronic diastolic congestive heart failure with a combination of underlying pulmonary arterial pressures and chronic obstructive pulmonary disease. Given the multifactorial component of all of this, it is likely that one contributes to the other. She was diuresed, seen by cardiology. Per Dr. Mariah Milling, decrease her diuresis, increase the Bumex up to 2 mg twice a day. She did diurese appropriately. She was back down to her baseline weight on the day of discharge. She was on 2 liters of O2 and was able to ambulate without exertional discomfort. Therefore, she is being discharged to home. Will continue on the higher dose diuresis at this time. Will need close follow-up of her electrolytes and daily weight checks as well. She is to call Dr. Mariah Milling for further follow-up.  2.  Chronic anemia. She has a history of this secondary to low iron. Plan is to refer her to hematology as an outpatient. She has had IV iron infusions in the past. She may need to undergo this again. Her hemoglobin did remain stable on day of discharge at 8.2.  3.  Other chronic medical issues remained stable. No changes to those regimens.   DISPOSITION: She is in stable condition to be discharged to home. She has  family at home per her daughter who helps take care of her.    ____________________________ Marisue IvanKanhka Vieva Brummitt, MD kl:dp D: 05/29/2013 07:52:06 ET T: 05/29/2013 08:55:01 ET JOB#: [id number]  cc: Marisue IvanKanhka Sajan Cheatwood, MD, <Dictator> Antonieta Ibaimothy J. Gollan, MD Marisue IvanKANHKA Kamira Mellette MD ELECTRONICALLY SIGNED 06/08/2013 12:43

## 2015-02-11 NOTE — Discharge Summary (Signed)
PATIENT NAME:  Catherine Holmes, Catherine Holmes MR#:  [id number] DATE OF BIRTH:  November 28, 1927  DATE OF ADMISSION:  06/09/2013 DATE OF DISCHARGE:  06/15/2013  DISCHARGE DIAGNOSES: 1.  Respiratory failure.  2.  Chronic obstructive pulmonary disease exacerbation. 3.  Diastolic congestive heart failure.  4.  Hyponatremia, chronic.  DISCHARGE MEDICATIONS: 1.  Doxazosin 4 mg p.o. at bedtime.  2.  Citalopram 20 mg p.o. daily.  3.  Gabapentin 100 mg 1 capsule b.i.d.  4.  Nitrostat 0.4 mg sublingual 1 tab sublingual every 5 minutes x 3 as needed for chest pain.  5.  Metformin 500 mg p.o. b.i.d. with meals.  6.  Albuterol ipratropium 2.5/0.5/3 mL 3 mL q. 6 hours as needed for wheezing.  7.  Zetia 10 mg p.o. daily.  8.  Hydralazine 50 mg p.o. b.i.d. Take an additional 0.5 mg tablet twice a day as needed.  9.  Bystolic 5 mg p.o. daily.  10.  Vitamin D3 1000 international units daily.  11.  Spiriva 18 mcg 1 inhalation daily.  12.  Calcium 600/200 one tab p.o. b.i.d.  13.  Dulera 5 mcg/100 mcg 2 puffs b.i.d.  14.  Tylenol PM at bedtime as needed.  15.  Dexilant 60 mg p.o. daily.  16.  Ferrous sulfate 325 mg p.o. every other day.  17.  Potassium chloride 10 mEq 1/2 tab p.o. b.i.d.  18.  Aspirin 81 mg p.o. daily.  19.  PreserVision as directed.  20.  Bumetanide 2 mg 1 tab p.o. b.i.d.  21.  Spironolactone 50 mg p.o. daily.  22.  Levofloxacin 250 mg p.o. daily x 3 more days.   CONSULTANTS: None.   PROCEDURES: None.   PERTINENT LABORATORY AND STUDIES: On day of discharge, sodium 127, potassium 3.6, creatinine 0.6. White blood cell count 9.8, hemoglobin 8.7, platelets 443.  BRIEF HOSPITAL COURSE: Acute respiratory failure: The patient initially came in with acute on chronic respiratory failure and found to have COPD exacerbation with infection. The patient was placed on IV antibiotics initially. Was transitioned over to Levaquin. Her cough and her shortness of breath and wheezing did improve. The patient was also  noted to have cardiomegaly with pulmonary edema on exam. She was started on Bumex and her symptoms improved with diuresis. She is noted to have hyponatremia which is chronic, but this can be followed up as an outpatient. She will complete 3 more days of the Levaquin. Continue with her home breathing medications. Will call us and follow up within 10 days. She is stable enough to be discharged home, but has had advanced home health nursing at home prior and will continue this as she goes home. Continue with 2 liters of O2 as directed.  ____________________________ Marisue IvanKanhka Kelvyn Schunk, MD kl:sb D: 06/15/2013 12:05:25 ET T: 06/15/2013 12:43:54 ET JOB#: [id number]  cc: Marisue IvanKanhka Soliana Kitko, MD, <Dictator> Marisue IvanKANHKA Zaray Gatchel MD ELECTRONICALLY SIGNED 07/13/2013 10:47

## 2015-02-11 NOTE — Consult Note (Signed)
PATIENT NAME:  Catherine Holmes, Catherine Holmes MR#:  [id number] DATE OF BIRTH:  1928/01/03  DATE OF CONSULTATION:  03/17/2013  REFERRING PHYSICIAN:  Marlaine HindAmir Darwish, MD   CONSULTING PHYSICIAN:  Lynnae Prudeobert Elliott, MD/Eriyah Fernando A. Arvilla MarketMills, ANP  PRIMARY CARE PHYSICIAN: Alonna BucklerAndrew Lamb, MD   REASON FOR CONSULTATION: Heme-positive stool with anemia.   HISTORY OF PRESENT ILLNESS: This is an 81 year old female patient with history of chronic diastolic congestive heart failure with ejection fraction 55%, chronic respiratory failure, GERD, history of Schatzki ring and large hiatus hernia who was in her usual state of health until last week when she developed increasing fatigue, weakness, lower extremity edema. She also experienced some nausea and epigastric discomfort. The patient recognized the symptoms as a flare of congestive heart failure and presented to the hospital with this history. She was found to have a hemoglobin of 8.4, and that is down from baseline February of 11.9. Stools were Hemoccult-positive in the Emergency Room. Chest x-ray showed cardiomegaly and pulmonary vascular congestion, and she has been admitted to the CCU for management of acute on chronic decompensated diastolic congestive heart failure and worsening anemia.   PAST MEDICAL HISTORY: 1.  Chronic diastolic congestive heart failure with ejection fraction 55%, followed by Dr. Mariah MillingGollan.  2.  History of respiratory failure/COPD.  3.  Severe systemic hypertension.  4.  Peripheral vascular disease with left internal carotid stenosis about 70%.  5.  Diabetes mellitus, type 2.  6.  Iron deficiency anemia.  7.  Hyperlipidemia.  8.  GERD. Mild Schatzki ring that was dilated per EGD 02/21/2010 as well as gastritis and large hiatus hernia. 9.  COPD, on 2 liters nasal cannula at home.  10.  Osteoporosis.  11.  History of TIA.  12.  Mitral valve prolapse.  13.  Sjogren's syndrome.  14.  Tic douloureux.   15.  Degenerative joint disease.  16.  History of acute  diverticulitis 2010.   PAST SURGICAL HISTORY: 1.  Hysterectomy, January 1966. 2.  Appendectomy 05/1964.  3.  Stent placement to the iliac artery.  4.  Cholecystectomy with gallstones  in 05/1964.   MEDICATIONS ON ADMISSION:  1.  Benicar 40/HCTZ 25 mg takes 1/2 tablet in the morning.  2.  Hydralazine 25 mg 4 times daily. The patient varies the dose 1/2 tablet to 2 times daily depending on if her systolic blood pressure is greater than 160.  3.  Metformin 500 mg twice a day.  4.  Calcium supplementation with vitamin D 1000 units daily.  5.  Spiriva 1 inhalation daily.  6.  Citalopram 20 mg daily.  7.  Gabapentin 100 mg twice daily.  8.  Aspirin 81 mg daily.  9.  Dulera 2 puffs twice daily.  10.  Doxazosin 4 mg at bedtime.  11.  Tramadol 50 mg 4 times a day as needed.  12.  Hydrocodone, dose unknown, rarely uses as needed for pain.   ALLERGIES: REPORTED MULTIPLE TO INCLUDE ATORVASTATIN, CEFACLOR  CODEINE, DOXYCYCLINE, EPINEPHRINE, ERYTHROMYCIN, GLIPIZIDE, LEVOFLOXACIN, LISINOPRIL , PENICILLIN, CRESTOR, SIMVASTATIN, BACTRIM, LASIX CAUSES ITCHING AND RASH, ADVAIR AND FORTEO.    HABITS: Negative tobacco, prior history of tobacco use, discontinued.   SOCIAL HISTORY: The patient lives with her daughter.   FAMILY HISTORY: Sister deceased with myocardial infarction at 4951. Mother deceased from cerebral thrombosis at 7271.  Negative for colorectal polyp or colon malignancy.   REVIEW OF SYSTEMS:  Twelve systems were reviewed, positive as noted for fatigue, weakness, shortness of breath, epigastric discomfort, mild nausea,  no vomiting, somewhat decreased appetite and reports 30-pound weight loss over the last year. She says she just does not want to eat much. She has discontinued her Dexilant and was taking that 60 mg daily and now takes it a couple of times a week because she has no flares of heartburn or indigestion. She has had multiple falls the last year and says her balance is poor. She sustained  a shoulder fracture in December and was in a rehab facility for a month. The patient's daughter is getting ready to go on vacation for a week, and the patient states she cannot be discharged home alone. She reports bowel movements are just a little more loose than normal, up to 3 times a day to once a day, no diarrhea; and she has looked carefully, without blood or melena in the stool. Currently, the patient says she is feeling better since she has been in the hospital. Remaining systems negative.   PHYSICAL EXAMINATION: VITAL SIGNS:  98.1, 64,  28, 131/53.  Pulse oximetry is 94% on 2 liters nasal cannula.  GENERAL: Elderly Caucasian female resting in bed.  HEENT: Head is normocephalic. Conjunctivae pink. Sclerae is anicteric. Oral mucosa is moist and intact.  NECK: Supple. Trachea midline.  HEART: Heart tones S1, S2 without murmur or gallop.  LUNGS: Essentially CTA with a few crackles bibasilar. Using oxygen. Ambulated in room without shortness of breath noted. She is able to speak without breathiness.  ABDOMEN: Soft, positive bowel sounds, positive epigastric tenderness with palpation, without HSM or masses. Small non tender umbilical hernia.   RECTAL: Deferred. The patient provided a stool sample which is medium brown formed stool. Guaiac reportedly positive in the Emergency Room, not repeated.  EXTREMITIES: Lower extremities with edema. Wearing thigh-high TED hose.  SKIN: Warm and dry. Color is pale.  NEUROLOGIC: Cranial nerves II through XII grossly intact. The patient is an excellent historian. Memory is good.  PSYCHIATRIC: Affect and mood within normal, very pleasant.   LABORATORY AND RADIOLOGICAL DATA:  Admission blood work notable for glucose 129. BNP is 2378, BUN is 13, creatinine 0.57, sodium 130, potassium 4.3, chloride is 95, AST is 42, ALT is 51. CPK and troponin negative. WBC 7.8,    hemoglobin 8.4, repeated about 12 hours later 8.3, platelets 353. MCV is 80.   Chest x-ray, PA and  lateral, with prominent interstitial markings which may represent component of mild pulmonary edema. The patient was taking shallow inspirations. There was chronic compression deformity within the mid thoracic spine.   Record review shows the patient did have a CT of the chest, abdomen and pelvis with contrast performed prior to this admission dated 12/11/2012, and it showed liver, spleen, adrenals, pancreas, and kidneys unremarkable. There is a fat-containing umbilical hernia identified. There were small subcentimeter lymph nodes, and the mediastinum and hilar regions and structures demonstrate small subcentimeter lymph nodes in the paratracheal and precarinal regions. Multichamber cardiac enlargement identified. Large hiatus hernia identified.   IMPRESSION: Heme-positive brown formed stool with acute on chronic anemia. Hemoglobin dropped from 11.9 to 8.3. The patient visualized no rectal bleeding or black tarry stools. She did develop some mild epigastric discomfort and nausea last week. She has decreased her Dexilant medication use because her heartburn symptoms resolved once she started sitting more in a recliner chair. The patient does have known history of large hiatus hernia, and it is possible she could have developed a cameron erosion. She does take multiple medications and has a history of  congestive heart failure, and that can put some pressure on the liver and give a passive congestion that can cause some nausea and possible discomfort. She has had her gallbladder removed in the past. The patient's most recent EGD was performed in 2011 for heartburn, dysphagia with findings of a mild Schatzki ring that was dilated, large hiatus hernia that showed mild erythema and gastritis.  She may have developed erosive gastritis or ulcer. The patient reports swallowing is about at baseline, not great but not bad. She has had negative testing for H. pylori. Prior colonoscopy in 2006 was normal.   PLAN:  1.  IV  Protonix b.i.d.  Maximize therapy for gastritis or ulcer disease.  2.  Characterize anemia at this point with B12, iron studies to include retic count.  3.  Considered abdominal ultrasound, but since she has had her gallbladder removed, we will continue to monitor. She had an unremarkable liver per CT study done February 2014.  4.  Consider luminal evaluation but would need cardiac clearance and recommend cardiac consult before proceeding to EGD.  5.  Ideally, it would be good to obtain a colonoscopy as well as an upper endoscopy for anemia and heme-positive stool, but we will need to evaluate the patient's clinical status before putting her through the more extensive colonoscopy study.  6.  Continue serial laboratory studies to evaluate for the hyponatremia, anemia, and will add anemia labs as well as coagulation studies.   This case was discussed with Dr. Mechele Collin in collaboration of care. Further GI recommendations pending results. Thank you for this consultation.       These services provided by Cala Bradford A. Arvilla Market, RN, MSN, ANP (Adult Nurse Practitioner) in collaboration with Dr. Scot Jun.   ____________________________ Ranae Plumber Arvilla Market, ANP (Adult Nurse Practitioner) kam:cb D: 03/17/2013 15:44:15 ET T: 03/17/2013 16:09:45 ET JOB#: [id number]  cc: Cala Bradford A. Arvilla Market, ANP (Adult Nurse Practitioner), <Dictator> Reola Mosher. Randa Lynn, MD Ranae Plumber Suzette Battiest, MSN, ANP-BC Adult Nurse Practitioner ELECTRONICALLY SIGNED 03/18/2013 8:26

## 2015-02-11 NOTE — H&P (Signed)
PATIENT NAME:  Catherine Holmes, [name] MR#:  [id number] DATE OF BIRTH:  06-02-1928  DATE OF ADMISSION:  06/09/2013  PRIMARY CARE PHYSICIAN: Marisue IvanKanhka Linthavong, MD  CHIEF COMPLAINT: Shortness of breath.   HISTORY OF PRESENT ILLNESS: This is an 81 year old female who presents to the hospital due to shortness of breath progressively getting worse over the past few days. The patient says that she was having a cough productive with yellow sputum also over the past few days along with some chills. She also had 1 episode of diarrhea with the above symptoms. The patient was recently discharged from the hospital about 2 weeks ago due to congestive heart failure. She went home. She was feeling fine until the past few days. She started developing worsening exertional shortness of breath and even at rest she is short of breath along with productive sputum and chills. The patient presented to the ER today as she was not improving. Was noted to have a leukocytosis with white cell count of 20,000, noted to have a right lower lobe infiltrate. Hospitalist services were contacted for further treatment and evaluation.   REVIEW OF SYSTEMS: CONSTITUTIONAL: No documented fever. Positive generalized weakness. No weight gain or weight loss.  EYES: No blurred or double vision.  ENT: No tinnitus. No postnasal drip. No redness of the oropharynx.  RESPIRATORY: Positive cough. No wheeze. No hemoptysis. Positive COPD.  CARDIOVASCULAR: No chest pain. No orthopnea. No palpitations. No syncope.  GASTROINTESTINAL: No nausea. No vomiting. No diarrhea. No abdominal pain. No melena or hematochezia.  GENITOURINARY: No dysuria or hematuria.  ENDOCRINE: No polyuria or nocturia. No heat or cold intolerance.  HEMATOLOGIC: No anemia. No bruising. No bleeding.  INTEGUMENTARY: No rashes. No lesions.  MUSCULOSKELETAL: No arthritis. No swelling. No gout.  NEUROLOGIC: No numbness. No tingling. No ataxia. No seizure type activity.  PSYCHIATRIC:  Positive depression. No anxiety. No ADD.   PAST MEDICAL HISTORY: Is consistent with hypertension, diabetes, hyperlipidemia, diastolic congestive heart failure, COPD oxygen dependent, neuropathy.   ALLERGIES: ARE TO MULTIPLE DRUGS INCLUDING ADVAIR, CECLOR, CODEINE, DARVON, DOXYCYCLINE, EPINEPHRINE, ERYTHROMYCIN, FORTEO, GLIPIZIDE, LASIX, LEVAQUIN, LISINOPRIL, PENICILLIN, SEPTRA, SINGULAIR, STATINS AND ZOCOR.   SOCIAL HISTORY: Used to be a smoker, quit about 20 to 30 years ago. Does have a 15 to 20 pack-year smoking history. No alcohol abuse. No illicit drug abuse. Lives at home with her daughter.   FAMILY HISTORY: Both mother and father are deceased from complication of a MI.  CURRENT MEDICATIONS:  1.  DuoNebs/albuterol ipratropium q. 6 hours as needed. 3.  Aspirin 81 mg daily. 4.  Bumetanide 2 mg b.i.d.. 5.  Bystolic 5 mg daily. 6.  Centrum multivitamin 1 tab daily. 7.  Calcium with vitamin D 1 tab b.i.d. 8.  Celexa 20 mg daily. 9.  Dexilant 60 mg daily. 10.  Doxazosin 4 mg daily. 11.  Dulera 2 puffs b.i.d.  12.  Iron sulfate 325 mg daily. 13.  Gabapentin 100 mg b.i.d. at lunch and at bedtime. 14.  Hydralazine 50 mg b.i.d. 15.  Metformin 500 mg b.i.d.  16.  Sublingual nitroglycerin as needed. 17.  Potassium 5 mEq b.i.d.  18.  PreserVision 1 tab b.i.d. 19.  Spiriva 1 puff daily. 20.  Aldactone 50 mg daily. 21.  Tylenol 1 tab at bedtime.  22.  Vitamin D3 1000 international units daily. 23.  Zetia 10 mg daily.   PHYSICAL EXAMINATION: Presently is as follows:  VITAL SIGNS: Temperature is 98.7, pulse 73, respirations 21, blood pressure 141/47 and sats 100%  on nonrebreather mask.  GENERAL: She is a pleasant-appearing female in mild respiratory distress.  HEAD, EYES, EARS, NOSE, THROAT: She is atraumatic, normocephalic. Extraocular muscles are intact. Pupils equal and reactive to light. Sclerae anicteric. No conjunctival injection. No pharyngeal erythema.  NECK: Supple. There is no  jugular venous distention. No bruits. No lymphadenopathy or thyromegaly.  HEART: Regular rate and rhythm, tachycardic. No murmurs. No rubs. No clicks.  LUNGS: She has some poor inspiratory and expiratory effort. Minimal end-expiratory wheezing. No rales. No rhonchi. Negative use of accessory muscles.  ABDOMEN: Soft, flat, nontender and nondistended. Has good bowel sounds. No hepatosplenomegaly appreciated.  EXTREMITIES: No evidence of any cyanosis, clubbing or peripheral edema. Has +2 pedal and radial pulses bilaterally.  NEUROLOGIC: The patient is alert, awake and oriented x 3. Globally weak. Difficult to do a full neurological exam. Moves all extremities spontaneously.  SKIN: Moist and warm with no rashes appreciated.  LYMPHATIC: There is no cervical or axillary lymphadenopathy.   LABORATORY AND DIAGNOSTICS: Serum glucose 133, BUN 15, creatinine 1.1, sodium 125, potassium 4.3, chloride 86, bicarb 34. LFTs within normal limits. Troponin less than 0.02. White cell count 20.5, hemoglobin 8.2, hematocrit 25.5, platelet count 336. INR 1.2.   The patient did have a chest x-ray done which showed cardiomegaly, interstitial thickening bilaterally, right lung base atelectasis versus pneumonia.   ASSESSMENT AND PLAN: This is an 81 year old female with history of chronic obstructive pulmonary disease, history of diastolic congestive heart failure, hypertension, hyperlipidemia, diabetes and neuropathy who presents to the hospital due to shortness of breath, cough and noted to have a leukocytosis with a suspected right lower lobe infiltrate.  1.  Pneumonia. This is likely the cause of the patient's shortness of breath, cough and productive sputum. This is likely hospital acquired pneumonia as the patient was recently discharged from the hospital about 2 weeks or so ago. The patient is allergic to penicillin and Levaquin. Therefore, I will start the patient on aztreonam and vancomycin, follow blood and sputum  cultures and follow her clinically.  2.  Chronic obstructive pulmonary disease. There is no evidence of any acute chronic obstructive pulmonary disease exacerbation. I will continue her Dulera and Spiriva and continue p.r.n. DuoNebs.  3.  Leukocytosis. I suspect this is secondary to pneumonia. We will follow her white cell count after antibiotic therapy.  4.  Hyponatremia. The exact etiology of hyponatremia is unclear. Sodium is down to 125. She is clinically asymptomatic with this. I suspect this is probably related to mild SIADH from her underlying pneumonia complicated with mild hypovolemia. I will gently hydrate her with IV fluids, follow her sodium, hold her diuretics for now.  5.  History of diastolic congestive heart failure. The patient clinically does not appear to be in congestive heart failure. I will hold her bumetanide for now.  6.  Hypertension. I will continue her Bystolic.  7.  Diabetes. Will hold the patient's metformin, place her on sliding scale insulin for now and a carb-controlled diet.  8.  Neuropathy. Continue gabapentin. 9.  Depression. Continue with her Celexa.  10.  Hyperlipidemia. Continue Zetia.   The patient will be transferred over to Dr. Sherryll Burger service. The patient is a LIMITED CODE.   TIME SPENT: 50 minutes.  ____________________________ Rolly Pancake. Cherlynn Kaiser, MD vjs:sb D: 06/09/2013 11:20:40 ET T: 06/09/2013 11:43:22 ET JOB#: [id number]  cc: Rolly Pancake. Cherlynn Kaiser, MD, <Dictator> Houston Siren MD ELECTRONICALLY SIGNED 07/04/2013 15:07

## 2015-02-12 NOTE — Discharge Summary (Signed)
PATIENT NAME:  Catherine Holmes, Catherine Holmes MR#:  [id number] DATE OF BIRTH:  11-20-1927  DATE OF ADMISSION:  05/07/2014 DATE OF DISCHARGE:  05/17/2014  DISCHARGE DIAGNOSES:  1.  Acute left hip fracture status post repair.  2.  Acute brief episode of atrial flutter, now sinus rhythm.  3.  Urinary tract infection that is resolved.  4.  Chronic obstructive pulmonary disease.  5.  Acute on chronic diastolic congestive heart failure.  6.  Adult-onset diabetes.  7.  Hypertension.  8.  Hyperlipidemia.  9.  Pulmonary hypertension.  10. Sjogren syndrome.   DISCHARGE MEDICATIONS:  1.  Gabapentin 100 mg 1 capsule p.o. b.i.d.  2.  Hydralazine 50 mg 1 tablet p.o. b.i.d.  3.  Spiriva 18 mcg 1 inhalation daily.  4.  Dulera 5/100, 2 puffs b.i.d.  5.  Dexilant 60 mg p.o. daily.  6.  Aspirin 81 mg p.o. daily.  7.  Multivitamin 1 tablet daily.  8.  Bystolic 5 mg p.o. daily.  9.  Klor-Con 10 mEq p.o. daily.  10. Zetia 10 mg p.o. daily.  11. Doxazosin 4 mg p.o. daily.  12. Celexa 20 mg p.o. daily.  13. Amiodarone 400 mg p.o. b.i.d., may decrease to 200 mg p.o. b.i.d. per cardiology recommendations.  14. Diltiazem 30 mg p.o. q.6 hours.  15. Bumetanide 1 mg p.o. daily.  16. Norco 325/5 mg 1 tablet p.o. q.6 hours as needed for severe pain.   CONSULTS: Orthopedics, cardiology.    PROCEDURES: Left hip fracture repair.   PERTINENT LABORATORY DATA: Sodium 131, potassium 4.4, creatinine 0.63, glucose 136, white blood cell count 14.3, hemoglobin 9.5, and platelets 599,000.   On the day of discharge the patient remained on 2 liters of oxygen. We will continue with this.   BRIEF HOSPITAL COURSE:  1.  Acute left hip fracture. The patient initially came in status post fall with acute left hip fracture. She underwent surgical intervention per Dr. Martha ClanKrasinski and has been stable from a orthopedic standpoint. Her hemoglobin did drop some, she did receive 1 unit of packed red blood cells and hemoglobin has been stable since  then. She will need PT/rehabilitation at this time.  2. Acute atrial flutter briefly, now in sinus rhythm. The patient was evaluated by cardiology. She is currently on amiodarone and diltiazem and rate has been stable. She has been in sinus rhythm. We will continue with the current regimen at this time.  Will need cardiology followup to determine how long to continue with the amiodarone. She is followed by Dr. Mariah MillingGollan.  3.  Urinary tract infection. She had a positive urinalysis, was treated with ceftriaxone appropriately and now is resolved. She has mild leukocytosis of 14.3 and needs re-evaluation of the white blood cell count in the outpatient setting.  4.  Chronic obstructive pulmonary disease, acute on chronic diastolic congestive heart failure, and pulmonary hypertension all contributing to her respiratory distress. The patient is stable at this time and continues on 2 liters, receiving diuresis along with breathing treatments. We will continue with her current regimen at this time.   DISPOSITION: The patient is in stable condition and will be discharged to a rehabilitation facility for further rehabilitation and PT and nursing care. She will follow up with me in outpatient setting after she is discharged.   Total time for discharge was greater than 30 minutes.    ____________________________ Catherine Holmes Catherine Gearheart, MD kl:lt D: 05/17/2014 08:02:42 ET T: 05/17/2014 08:07:44 ET JOB#: [id number]  cc: Catherine Holmes Brylee Mcgreal, MD, <Dictator> [facility]  Kemonie Cutillo MD ELECTRONICALLY SIGNED 06/21/2014 8:23

## 2015-02-12 NOTE — Op Note (Signed)
PATIENT NAME:  Catherine Holmes, Tekla K MR#:  [id number] DATE OF BIRTH:  [birth year]  DATE OF PROCEDURE:  05/10/2014  PREOPERATIVE DIAGNOSIS: Left femoral neck hip fracture.   POSTOPERATIVE DIAGNOSIS: Left femoral neck hip fracture.   PROCEDURE: Left hip hemiarthroplasty.   ANESTHESIA: Spinal.   SPECIMEN: Femoral head to pathology.   SURGEON: Juanell FairlyKevin Kanita Delage, M.D.   ESTIMATED BLOOD LOSS: 350 mL.   COMPLICATIONS: None.   IMPLANTS: Stryker Accolade HFx stem size #3, with a Stryker 47 mm Unitrax head and a +0 neck adjustment sleeve.   INDICATIONS FOR PROCEDURE: The patient is an 81 year old female who sustained a fall prior to admission. She has multiple medical comorbidities, including COPD, diastolic CHF, moderate to severe pulmonary hypertension. The patient needed 3 days of workup and treatment before she was stable enough to bring to the operating room on 05/10/2014. I had reviewed the risks and benefits of surgery with the patient and her daughter. They understand the risks include, but are not limited to, infection requiring removal of the prosthesis, bleeding requiring blood transfusion, nerve or blood vessel injury, especially injury to the sciatic nerve which may lead to permanent footdrop, fracture, dislocation, leg length discrepancy and change in lower extremity rotation, persistent pain and the need for further surgery including conversion to a total hip arthroplasty. They understand medical risks include, but are not limited to, DVT and pulmonary embolism, myocardial infarction, stroke, pneumonia, respiratory failure and death.   The patient was deemed high risk for surgery by the medical services, including pulmonary and cardiology who had seen and cleared the patient for surgery.   PROCEDURE NOTE: The patient was brought to the operating room. She underwent a spinal anesthetic. She was then placed in a right lateral decubitus position. All bony prominences were adequately padded. She had  an axillary roll placed under her right side, and the right leg was adequately padded to avoid compression of the common peroneal nerve during the case. She was then prepped and draped in a sterile fashion. A timeout was performed to verify the patient's name, date of birth, medical record number, correct site of surgery and correct procedure to be performed. It was also used to verify the patient had received antibiotics and appropriate instruments, implants and radiographic studies were available in the room. Once all in attendance were in agreement, the case began.   A #10 blade was used to make a curvilinear incision over the posterolateral left hip. The subcutaneous tissues were dissected using electrocautery. All bleeding vessels were cauterized during the exposure. The fascia lata was then identified and cleared of all adipose tissue. A deep #10 blade was used to incise the fascia lata. The gluteus maximus muscle was then split in line with its fibers, revealing the underlying hip bursa and external rotators. The external rotators were detached from the greater trochanter and reflected posteriorly to protect the sciatic nerve. A #2 Tycron was used to tag the piriformis for possible later repair. The underlying capsule was then well visualized. A T-shaped capsulotomy was then performed. This was also tagged with #2 Tycron for later repair. The fracture was then easily visualized. The femoral head was removed using a corkscrew device and measured to be 47 mm in diameter. A proximal femoral osteotomy was then made approximately 1 fingerbreadth above the lesser trochanter. Two Cobra retractors then placed around the acetabulum. The 47 mm femoral head trial was then inserted in the acetabulum and found to have an excellent fit. Attention was then turned  to femoral preparation.   A box osteotome was used to create the initial entry into the proximal femur. A hand reamer was then advanced into the femoral canal.  A femoral canal sounder was then sent down the femoral canal to ensure no penetration of the femoral cortex had been created during the hand reaming. Sequential broaches were then used and gently malleted into position. The best medial and lateral fit was found to be with a size 3 femoral stem. The 127-degree neck trial along with a neutral head were then assembled onto the trunnion, and the construct was reduced into the hip joint with the 47 mm trial head. The hip was taken through a full range of motion. Had excellent motion and excellent stability. The leg lengths were equivalent. All trial instruments were then removed. The joint was pulse lavaged. The actual size 3 Stryker Accolade HFx stem was then inserted and gently malleted into position. The 47 mm outer head trial with a +0 head was then again placed on the trunnion and reduced and taken through a full range of motion and deemed to be stable with equivalent leg lengths. The actual 47 mm Unitrax head with a +0 neck adjustment sleeve was then malleted gently onto the trunnion and the construct was reduced. The hip joint was copiously irrigated with pulse lavage. The posterior capsule was repaired with #2 Tycron, and the piriformis was repaired to the abductor tendon for soft tissue repair. Again, the joint was copiously irrigated. The fascia lata was closed with interrupted 0 Vicryl suture. The subcutaneous tissue was closed in 2 layers with 0 Vicryl and 2-0 Vicryl. The skin was approximated with staples. A dry sterile dressing was applied. The patient was then placed supine on the operative table. Leg lengths were found to be equivalent clinically. An abduction pillow was placed between her legs, and she was transferred to the hospital bed. She was brought to the PACU in stable condition. I spoke with the patient's family in the postoperative consultation room to let them know the case had gone without complication and that the patient was stable in the  recovery room. The plan was for the patient to stay in the ICU overnight for monitoring given her labile cardiopulmonary status.   ____________________________ Kathreen Devoid, MD klk:gb D: 05/12/2014 22:54:38 ET T: 05/12/2014 23:56:41 ET JOB#: [id number]  cc: Kathreen Devoid, MD, <Dictator> Kathreen Devoid MD ELECTRONICALLY SIGNED 05/13/2014 1:21

## 2015-02-12 NOTE — H&P (Signed)
PATIENT NAME:  Alanda AmassSINNER, Peighton K MR#:  [id number] DATE OF BIRTH:  1928-05-01  DATE OF ADMISSION:  05/04/2014  REFERRING PHYSICIAN: Dr. Dorothea GlassmanPaul Malinda.  PRIMARY CARE PHYSICIAN: Dr. Burnadette PopLinthavong.  CHIEF COMPLAINT: [facility]. Was found to have hyponatremia.   HISTORY OF PRESENT ILLNESS: This is an 81 year old female with known past medical history of hypertension, COPD, diastolic CHF, diabetes mellitus, hyperlipidemia, Sjgren's syndrome, who [facility] blood pressure systolic being 201/98. In the Emergency Room the patient was found to be hypertensive where she was given oral clonidine tablets which did improve her blood pressure where it went from 216/83 upon admission to 157/76. The patient had basic workup done in Emergency Department where she was found to have hyponatremia at 123. The patient's baseline sodium is around 130 in April of this year, but in the past the patient had hypoglycemia as low as 127 in the past as well. The patient denies any chest pain, any shortness of breath, any fatigue, any weakness, any excessive drinking, or polyuria or polydipsia. Denies fevers, chills, dysuria. Urinalysis was negative in ED.   PAST MEDICAL HISTORY:  1. COPD. 2. Diastolic CHF.  3. Diabetes mellitus.  4. Hypertension.  5. Hyperlipidemia.  6. Neuropathy. 7. Sjgren's syndrome.   ALLERGIES: ADVAIR, EPINEPHRINE, TETRACYCLINE, FORTEO, GLIPIZIDE, LASIX, LEVAQUIN, LISINOPRIL, PENICILLIN, SEPTRA, SINGULAIR, STATIN, AND ZOCOR.   PAST SURGICAL HISTORY: None.   SOCIAL HISTORY: The patient former smoker, lives at home. No history of alcohol or illicit drug use.   FAMILY HISTORY: Both mother and father are deceased from MI.   REVIEW OF SYSTEMS:  GENERAL: Patient denies fever, chills, fatigue, weakness, weight gain, weight loss. EYES: Denies blurry or double vision, inflammation, glaucoma.  ENT: Denies tinnitus, ear pain,  hearing loss, epistaxis. RESPIRATORY: Denies cough, wheezing, hemoptysis. Reports history of COPD.  CARDIOVASCULAR: Denies chest pain, shortness of breath, syncope, or palpitation.  GASTROINTESTINAL: Denies nausea, vomiting, diarrhea, abdominal pain.  GENITOURINARY: Denies dysuria, hematuria, renal colic.  ENDOCRINE: Denies polyuria, polydipsia, heat or cold intolerance.  HEMATOLOGY: Denies anemia, easy bruising, bleeding diathesis.  INTEGUMENT: Denies acne, rash, or skin lesion.  MUSCULOSKELETAL: Denies any cramps, lower back pain, or gout.  NEUROLOGIC: Denies any vertigo, ataxia, CVA, TIA.  PSYCHIATRIC: Denies anxiety, insomnia, or depression.  MEDICATIONS:  1. Aldactone 50 mg oral daily.  2. Aspirin 81 mg daily.  3. Doxazosin 2 mg oral daily.  4. Gabapentin 100 mg oral 2 times a day. 5. Bystolic 5 mg oral daily.  6. Zetia 10 mg oral daily.  7. Spiriva 18 mcg inhalational daily. 8. Dulera 5 mcg/100 mcg inhalational 2 puffs b.i.d.  9. Potassium 10 mEq oral daily.  10. Dexilant 60 mg oral daily.  11. Hydralazine 50 mg oral 2 times a day.  12. Multivitamin 1 tablet oral daily.  13. Vitamin D3 at 1000 international units oral daily.   PHYSICAL EXAMINATION:  VITAL SIGNS: Temperature 97.8, pulse 71, respiratory rate 18, blood pressure 134/70, saturating 93% on room air.  GENERAL: Frail, elderly female, looks comfortable in bed, in no apparent distress.  HEENT: Head atraumatic, normocephalic. Pupils are equal and reactive to light. Pink conjunctivae. Anicteric sclerae. Dry oral mucosa.  NECK: Supple. No thyromegaly. No JVD. No carotid bruits.  CHEST: Good air entry bilaterally. No wheezing, rales, or rhonchi.  CARDIOVASCULAR: S1, S2 heard. No rubs, murmurs, gallops.  ABDOMEN: Soft, nontender, nondistended. Bowel sounds present.  EXTREMITIES: No edema. No clubbing. No cyanosis. Pedal  pulses +2 bilaterally.  PSYCHIATRIC: Appropriate affect. Awake, alert x 3. Intact judgment and insight.   NEUROLOGIC: Cranial nerves grossly intact. Motor 5/5. No focal deficits.  SKIN: Delayed skin turgor, dry.  MUSCULOSKELETAL: No joint effusion or erythema.   PERTINENT LABORATORY DATA: Glucose 106, BUN 11, creatinine 0.64, sodium 123, potassium 4.6, chloride 85, osmolality 248. Anion gap 5, ALT 26, AST 26, alkaline phosphatase 94. Troponin less than 0.02. White blood cells 9.3, hemoglobin 13.2, hematocrit 39.3, platelets 333,000. Urinalysis negative for leukocyte esterase and nitrite.   ASSESSMENT AND PLAN:  1. Hypernatremia. The patient appears to be clinically dehydrated. We will hold her Aldactone. We will continue with intravenous fluids. We will hydrate her. We will recheck level in a.m. We will check urine osmolality as well.  2. Hypertension improved. Will resume back on her home medication. We will add p.r.n. intravenous hydralazine pushes.  3. Chronic obstructive pulmonary disease. No wheezing. We will continue her on Dulera and Spiriva.  4. Diastolic congestive heart failure. Appears to be compensated. We will hold Aldactone. We will continue the patient on hydration. We will monitor closely given her history of diastolic congestive heart failure.  5. Diabetes mellitus. The patient has been off her medications for the last 2 months as instructed by her physician. Will monitor her fingersticks before meals and at bedtime.  6. Hyperlipidemia. Continue with Zetia.  7. Neuropathy. Continue with gabapentin.  8. Deep vein thrombosis prophylaxis. Subcutaneous heparin.   CODE STATUS: The patient reports she is a full code, but she does not want to be kept on life support if has no meaningful recovery.   TOTAL TIME SPENT ON ADMISSION AND PATIENT CARE: 55 minutes.    ____________________________ Starleen Arms, MD dse:lt D: 05/05/2014 01:12:34 ET T: 05/05/2014 04:11:21 ET JOB#: [id number]  cc: Starleen Arms, MD, <Dictator> Kayana Thoen Teena Irani MD ELECTRONICALLY SIGNED 05/08/2014  15:44

## 2015-02-12 NOTE — H&P (Signed)
PATIENT NAME:  Catherine Holmes, Catherine Holmes MR#:  [id number] DATE OF BIRTH:  Jan 22, 1928  DATE OF ADMISSION:  11/02/2013  PRIMARY CARE PHYSICIAN: Dr. Burnadette Pop.   REFERRING PHYSICIAN: Dr. Suella Broad.   CHIEF COMPLAINT: Shortness of breath.   HISTORY OF PRESENT ILLNESS: Catherine Holmes is an 81 year old female with history of congestive heart failure, hypertension, hyperlipidemia and COPD, who presented to the Emergency Department with complaints of shortness of breath for the last 3 to 4 days. The patient states she received iron infusion on Friday and since then started to experience shortness of breath, which has been gradually getting worse. The patient noticed her oxygen saturations being in the 60s on a couple of occasions, especially with exertion. The patient denies having any cough or productive sputum. Denies having any fever. Noted to have somewhat increased lower extremity swelling. The patient usually uses oxygen 2 L at night; however, in the last couple of days, the patient has been using oxygen around-the-clock. Tonight, the shortness of breath was significantly worse. The patient has PND and orthopnea. EMS was called and she was placed on CPAP and nitroglycerin paste. The patient's oxygen saturations were 99% on 2 L of oxygen. The patient's initial blood pressure was 205/97. The patient was given labetalol with improvement of the blood pressure to systolic blood pressure to 180s. The patient states she has been compliant with her medications, fluid and salt intake. Denies having any sick contacts. The patient also received 1 dose of Bumex in the Emergency Department.   PAST MEDICAL HISTORY:  1.  Hypertension.  2.  Diabetes mellitus.  3.  Hyperlipidemia.  4.  Diastolic congestive heart failure. 5.  COPD, oxygen dependent.  6.  Neuropathy.  ALLERGIES: MULTIPLE ALLERGIES OF ADVAIR, EPINEPHRINE, TETRACYCLINE, FORTEO, GLIPIZIDE, LASIX, LEVAQUIN, LISINOPRIL, PENICILLIN, SEPTRA, SINGULAIR, STATINS AND  ZOCOR.   HOME MEDICATIONS:  1.  Vicodin 300 mg 1 tablet every 6 hours as needed.  2.  Tylenol PM at bedtime. 3.  Spironolactone 50 mg once a day.  4.  Spiriva 18 mcg once a day.  5.  Potassium chloride 10 mEq extended release daily 2 times a day.  6.  Ocuvite daily.  7.  Nitrostat 0.4 mg sublingual every 5 minutes as needed.  8.  Multivitamin 1 tablet daily.  9.  Metformin 500 mg 2 times a day.  10. Hydralazine 50 mg tablets 2 times a day.  11. Gabapentin 100 mg 2 times a day.  12.  500 mg 2 puffs 2 times a day.  13. Dexilant 60 mg once a day.  14. Citalopram 20 mg once daily. 15. Bystolic 5 mg once a day. 16. Bumex 50 mg 2 times a day.  17. Aspirin 81 mg once a day.  18. Albuterol 2 puffs 4 times a day.   SOCIAL HISTORY: Former smoker. She has a 20 to 30 pack-year history of smoking. No history of alcohol or drug use. Lives at home with her daughter.   FAMILY HISTORY: Both mother and father deceased from MI.   REVIEW OF SYSTEMS: CONSTITUTIONAL: Generalized weakness. EYES: No change in vision. ENT: No change in hearing. RESPIRATORY: Shortness of breath. CARDIOVASCULAR: No chest pain or palpitations. GASTROINTESTINAL: No nausea, vomiting or abdominal pain. Somewhat decreased appetite. GENITOURINARY: No dysuria or hematuria. ENDOCRINE: No polyuria or polydipsia. HEMATOLOGY: No easy bruising or bleeding. SKIN: No rash or lesions. MUSCULOSKELETAL: No joint pains or aches. NEUROLOGIC: No weakness or numbness in any part of the body.   PHYSICAL EXAMINATION:  GENERAL: This is well-developed, well-nourished, age-appropriate female lying down in the bed on BiPAP.  VITAL SIGNS: Temperature 97.8, pulse 64, blood pressure 161/64, respiratory rate 16, oxygen saturation is 99% on 2 L of oxygen.  HEENT: Head normocephalic, atraumatic. There is no sclerae icterus. Conjunctivae normal. Pupils equal and react to light. Extraocular movements are intact. Mucous membranes moist. No pharyngeal erythema.   NECK: Supple. No lymphadenopathy. No JVD. No carotid bruit. No thyromegaly.  CHEST: Has no focal tenderness. Bilateral diffuse crackles are heard.  HEART: S1, S2 regular. No murmurs are heard.  ABDOMEN: Bowel sounds present. Soft, nontender, nondistended. No hepatosplenomegaly.  EXTREMITIES: 2+ pitting edema extending up to the knee.  NEUROLOGIC: The patient is alert and oriented to place, person and time. Cranial nerves II through XII intact . Motor 5 out of 5 in upper and lower extremities.  SKIN: No rash or lesions.  MUSCULOSKELETAL: Good range of motion in all the extremities.   LABS: ABG: pH of 7.48, pCO2 of 49, pO2 135 on BiPAP. Chest x-ray, one view portable, stable marked cardiomegaly with a large hernia and COPD. No acute cardiopulmonary disease.  Troponin negative less than 0.02. BNP 2000. BMP is completely within normal limits, except for sodium of 129. CBC: WBC of 8.4. The rest of all the values are within normal limits.   ASSESSMENT AND PLAN: Catherine Holmes is an 81 year old female, who comes to the Emergency Department with shortness of breath.  1.  Congestive heart failure, diastolic, acute on chronic. We will keep the patient on Bumex IV b.i.d. Continue with daily weights and strict intakes and outputs. Continue Bystolic and spironolactone.  2.  Hypertension, accelerated. This could be secondary to stress; however, the patient's blood pressure is improving. Continue with spironolactone, Bystolic and hydralazine.  3.  Hypoxic respiratory failure. Will take her off of BiPAP. The patient's oxygen saturation has improved. We will continue to follow up.  4.  Anemia. The patient has hemoglobin of 8.3. The patient refused any transfusions; however, will consider transfusion for a hemoglobin less than 8.  5.  Debility. We will involve the physical therapy and occupational therapy.  6.  Diabetes mellitus. We will continue to hold the metformin. Continue the sliding scale insulin.  7.  Keep  the patient on deep vein thrombosis prophylaxis with Lovenox.   TIME SPENT: 45 minutes.   ____________________________ Susa GriffinsPadmaja Shakirra Buehler, MD pv:aw D: 11/02/2013 01:47:20 ET T: 11/02/2013 09:39:36 ET JOB#: [id number]  cc: Susa GriffinsPadmaja Raymond Bhardwaj, MD, <Dictator> Marisue IvanKanhka Linthavong, MD Susa GriffinsPADMAJA Tashauna Caisse MD ELECTRONICALLY SIGNED 11/15/2013 21:27

## 2015-02-12 NOTE — Consult Note (Signed)
General Aspect Primary cardiologist: Dr. Rockey Situ  81 y/o F with h/o severe pulmonary HTN, COPD, chronic SOB, chronic diastolic CHF, DM, HTN, carotid artery disease, TIA, and MVP presents to [facility] ED today after falling while attempting to put on her pants this morning while still standing up and subsequently suffering a sub capital fracture of the left hip. We are consulted for surgical clearance.   Present Illness 81 y/o F with the above problem list who suffered a sub capital fracture of the left hip this morning while attempting to put on her pants in the her bathroom while standing up. She lost her balance, fell, and suffered the above fracture. She did not hit her head or suffer LOC.   She has limited functional capacity at baseline, stating she does not do much at home. She is limited by her weakness. She does not develop any chest pain with activity. Using O2 only at nighttime. Weights have been mildly elevated. Denies any lower extremity swelling. Denies any orthopnea or nocturnal dyspnea.  She was recently admited to [facility] on 05/04/2014 wth accelerated HTN and discharged the same day with improvement in her BP with treatment.  Last echo on 11/10/2013: EF 65-70%, mild LVH, mild mitral regurg, mild-mod aortic sclerosis/calcification without any evidence of stenosis, mod-severe pulmonary artery pressures.  Last carotid doppler on 01/11/2011: Right 40-59% disease. Left 60-79% disease.   Physical Exam:  GEN well developed, well nourished, in pain 2/2 left hip fracture   HEENT PERRL   NECK supple   RESP normal resp effort  clear BS   CARD Regular rate and rhythm  Murmur  2/6 systolic, LUSB   ABD denies tenderness  soft  normal BS   EXTR negative edema, left leg in support   SKIN normal to palpation   NEURO cranial nerves intact   PSYCH alert, A+O to time, place, person, good insight   Review of Systems:  General: No Complaints   Skin: No Complaints   ENT: No Complaints    Eyes: No Complaints   Neck: No Complaints   Respiratory: Short of breath  chronic cough, has COPD and pulm HTN   Cardiovascular: No Complaints   Gastrointestinal: No Complaints   Genitourinary: No Complaints   Vascular: No Complaints   Musculoskeletal: Muscle or joint pain  left lower extremity   Neurologic: No Complaints   Hematologic: No Complaints   Endocrine: No Complaints   Psychiatric: No Complaints   Review of Systems: All other systems were reviewed and found to be negative   Medications/Allergies Reviewed Medications/Allergies reviewed   Family & Social History:  Family and Social History:  Family History Coronary Artery Disease  Mother and father deceased from MI   Social History negative ETOH, negative Illicit drugs   + Tobacco Prior (greater than 1 year)  0.5 ppd x 50 years   Place of Living Home     IDA:    Hiatal Hernia: large per barium study results   Mitral Valve Prolapse:    TIA - Transient Ischemic Attack:    CHF:    sjogens syndrome:    Hyperlipidemia:    [place]:    CAD:    osteoporosis:    COPD:    gerd:    Diabetes Mellitus, Type II (NIDD):    htn:   Home Medications: Medication Instructions Status  gabapentin 100 mg oral capsule 1 cap(s) orally 2 times a day at lunch and at bedtime Active  aspirin 81 mg oral  tablet 1 tab(s) orally once a day Active  Ocuvite Antioxidant Multiple Vitamins and Minerals oral capsule 1 cap(s) orally 2 times a day Active  multivitamin 1 tab(s) orally once a day Active  hydrALAZINE 50 mg oral tablet 1 tab(s) orally 2 times a day (may take an additional 0.5 tablet twice a day as needed) Active  Spiriva 18 mcg inhalation capsule 1 each inhaled once a day Active  Dulera 5 mcg-100 mcg/inh inhalation aerosol 2 puff(s) inhaled 2 times a day Active  Dexilant 60 mg oral delayed release capsule 1 cap(s) orally once a day (in the evening) Active  Bystolic 5 mg oral tablet 1 tab(s) orally once  a day Active  Vitamin D3 1000 intl units oral capsule 1 cap(s) orally once a day Active  Klor-Con 10 10 mEq oral tablet, extended release 1 tab(s) orally once a day, As Needed Active  Zetia 10 mg oral tablet 1 tab(s) orally once a day Active  doxazosin 4 mg oral tablet 1 tab(s) orally once a day Active  CeleXA 20 mg oral tablet 1 tab(s) orally once a day Active  Calcium 600+D Plus Minerals Vitamin D with Minerals oral tablet, chewable 2 tab(s) orally  Active   Lab Results:  Hepatic:  17-Jul-15 08:21   Bilirubin, Total 0.3  Alkaline Phosphatase 80 (45-117 NOTE: New Reference Range 09/11/13)  SGPT (ALT) 34  SGOT (AST) 31  Total Protein, Serum 6.7  Albumin, Serum 3.5  Routine Chem:  17-Jul-15 08:21   Glucose, Serum  108  BUN  23  Creatinine (comp) 0.69  Sodium, Serum  129  Potassium, Serum 4.4  Chloride, Serum  93  CO2, Serum 30  Calcium (Total), Serum 8.6  Anion Gap  6  Osmolality (calc) 263  eGFR (African American) >60  eGFR (Non-African American) >60 (eGFR values <39m/min/1.73 m2 may be an indication of chronic kidney disease (CKD). Calculated eGFR is useful in patients with stable renal function. The eGFR calculation will not be reliable in acutely ill patients when serum creatinine is changing rapidly. It is not useful in  patients on dialysis. The eGFR calculation may not be applicable to patients at the low and high extremes of body sizes, pregnant women, and vegetarians.)  Cardiac:  17-Jul-15 08:21   CPK-MB, Serum  4.5 (Result(s) reported on 07 May 2014 at 11:02AM.)  Troponin I < 0.02 (0.00-0.05 0.05 ng/mL or less: NEGATIVE  Repeat testing in 3-6 hrs  if clinically indicated. >0.05 ng/mL: POTENTIAL  MYOCARDIAL INJURY. Repeat  testing in 3-6 hrs if  clinically indicated. NOTE: An increase or decrease  of 30% or more on serial  testing suggests a  clinically important change)    12:08   CPK-MB, Serum  4.2 (Result(s) reported on 07 May 2014 at 12:52PM.)   Troponin I < 0.02 (0.00-0.05 0.05 ng/mL or less: NEGATIVE  Repeat testing in 3-6 hrs  if clinically indicated. >0.05 ng/mL: POTENTIAL  MYOCARDIAL INJURY. Repeat  testing in 3-6 hrs if  clinically indicated. NOTE: An increase or decrease  of 30% or more on serial  testing suggests a  clinically important change)  Routine UA:  17-Jul-15 08:21   Color (UA) Yellow  Clarity (UA) Clear  Glucose (UA) Negative  Bilirubin (UA) Negative  Ketones (UA) Negative  Specific Gravity (UA) 1.012  Blood (UA) Negative  pH (UA) 5.0  Protein (UA) Negative  Nitrite (UA) Negative  Leukocyte Esterase (UA) 1+ (Result(s) reported on 07 May 2014 at 10:32AM.)  RBC (UA) 1 /HPF  WBC (UA) 40 /HPF  Bacteria (UA) 3+  Epithelial Cells (UA) 1 /HPF (Result(s) reported on 07 May 2014 at 10:32AM.)  Routine Coag:  17-Jul-15 08:21   Prothrombin 12.1  INR 0.9 (INR reference interval applies to patients on anticoagulant therapy. A single INR therapeutic range for coumarins is not optimal for all indications; however, the suggested range for most indications is 2.0 - 3.0. Exceptions to the INR Reference Range may include: Prosthetic heart valves, acute myocardial infarction, prevention of myocardial infarction, and combinations of aspirin and anticoagulant. The need for a higher or lower target INR must be assessed individually. Reference: The Pharmacology and Management of the Vitamin K  antagonists: the seventh ACCP Conference on Antithrombotic and Thrombolytic Therapy. VOJJK.0938 Sept:126 (3suppl): [id number]. A HCT value >55% may artifactually increase the PT.  In one study,  the increase was an average of 25%. Reference:  "Effect on Routine and Special Coagulation Testing Values of Citrate Anticoagulant Adjustment in Patients with High HCT Values." American Journal of Clinical Pathology 2006;126:400-405.)  Routine Hem:  17-Jul-15 08:21   WBC (CBC) 7.5  RBC (CBC) 4.05  Hemoglobin (CBC) 12.1  Hematocrit  (CBC) 36.6  Platelet Count (CBC) 295 (Result(s) reported on 07 May 2014 at 08:48AM.)  MCV 90  MCH 29.8  MCHC 33.0  RDW  15.7   EKG:  EKG Interp. by me   Interpretation NSR, 64, no st/t changes   Radiology Results: XRay:    17-Jul-15 09:06, Chest 1 View AP or PA  Chest 1 View AP or PA   REASON FOR EXAM:    preop  COMMENTS:       PROCEDURE: DXR - DXR CHEST 1 VIEWAP OR PA  - May 07 2014  9:06AM     CLINICAL DATA:  Preoperative chest x-ray in    EXAM:  CHEST - 1 VIEW    COMPARISON:  Prior chest x-ray 01/23/2014 ; PET-CT 11/13/2013    FINDINGS:  Stable cardiomegaly and mediastinal contours. Atherosclerotic  calcification present in the transverse aorta. There is a moderately  large hiatal hernia. Mild pulmonary vascular congestion and  cephalization without overt edema. Stable background bronchitic and  emphysematous changes. No pleural effusion or pneumothorax. No acute  osseous abnormality. Remote healed right right humeral neck fracture  with residual posttraumatic deformity.     IMPRESSION:  1. No active disease.  2. Mild vascular congestion and cephalization without overt edema.  3. Stable cardiomegaly.  4. Moderately large hiatal hernia.  5. COPD/emphysema.      Electronically Signed    By: Jacqulynn Cadet M.D.    On: 05/07/2014 09:12         Verified By: Criselda Peaches, M.D.,    17-Jul-15 09:06, Hip Left Complete  Hip Left Complete   REASON FOR EXAM:    pain after fall  COMMENTS:       PROCEDURE: DXR - DXR HIP LEFT COMPLETE  - May 07 2014  9:06AM     CLINICAL DATA:  Left hip pain status post fall    EXAM:  LEFT HIP - COMPLETE 2+ VIEW    COMPARISON:  None.    FINDINGS:  There is an acute subcapital fracture of the left hip. The  intertrochanteric and subtrochanteric regions are intact. There is  degenerative change of the left hip joint as well as of the right  hip joint. No pelvic fracture is demonstrated.     IMPRESSION:  There is an  acute sub capital fracture of  the left hip.      Electronically Signed    By: David  Martinique    On: 05/07/2014 09:08         Verified By: DAVID A. Martinique, M.D., MD    17-Jul-15 09:50, Knee Left AP and Lateral  Knee Left AP and Lateral   REASON FOR EXAM:    pain  COMMENTS:       PROCEDURE: DXR - DXR KNEE LEFT AP AND LATERAL  - May 07 2014  9:50AM     CLINICAL DATA:  Fall.    EXAM:  LEFT KNEE - 1-2 VIEW    COMPARISON:  None.    FINDINGS:  Mild degenerative changes left knee. No evidence of fracture or  dislocation. No effusion. Vascular calcification present.   IMPRESSION:  No acute abnormality.      Electronically Signed    By: Marcello Moores  Register    On: 05/07/2014 09:56         Verified By: Osa Craver, M.D., MD    Singlet: Wheezing  Advair Diskus: Itching, Hives  Doxycycline: Hives  Glipizide: Hives  Lisinopril: Itching, Hives  Epinephrine: Itching, Hives  Levaquin: Muscles aches, N/V/Diarrhea  Ceclor: Other  Erythromycin: Unknown  Septra: Unknown  Tavist-D: Unknown  Penicillin: Unknown  Darvon: Unknown  Codeine: Unknown  Zocor: Unknown  Forteo: Unknown  Statins: Muscles aches  Lasix: Itching  Vital Signs/Nurse's Notes: **Vital Signs.:   17-Jul-15 13:21  Vital Signs Type Q 4hr  Temperature Temperature (F) 98  Celsius 36.6  Temperature Source oral  Pulse Pulse 81  Respirations Respirations 18  Systolic BP Systolic BP 841  Diastolic BP (mmHg) Diastolic BP (mmHg) 53  Mean BP 86  Pulse Ox % Pulse Ox % 95  Pulse Ox Activity Level  At rest  Oxygen Delivery 5L    66:06  Systolic BP Systolic BP 301  Diastolic BP (mmHg) Diastolic BP (mmHg) 68  Mean BP 86    Impression 81 y/o F with H/O severe pulmonary HTN, COPD, chronic SOB, chronic diastolic CHF, DM, HTN, carotid artery disease, TIA, and MVP presents to [facility] ED today after falling while attempting to put on her pants this morning while still standing up and subsequently suffering a sub  capital fracture of the left hip. We are consulted for surgical clearance.   1) Surgical clearance: She does present a moderate cardiopulmoary risk for repair of the fractured left hip. She does not live a very functional life at home, limited by her multiple medical conditions. She recently had echo on 11/10/2013 that showed an EF of 65-70%, mild LVH, mild mitral regurg, mild-moderate aortic sclerosis/calcification without stenosis, and moderate-severe pulmonary artery pressures. Given this study she can be cleared with a moderate risk for cardiopulmonary complications during her procedure. She is aware of this.   2) Pulmonary HTN: PASP 65 which is improved from prior study. Out patient follow up.   3) Chronic diastolic CHF: Stable at this time. BP is ok.    4) Carotid artery disease: Right 40-59%. Left 60-79%.   5) Left hip fracture: Per ortho.   Electronic Signatures for Addendum Section:  Kathlyn Sacramento (MD) (Signed Addendum 17-Jul-15 19:28)  The patient was seen and examined. Agree with the above. She presented with hip fracture after a mechanical fall. No chest pain. Previous echo showed normal EF with mod-severe pulmonary hypertension. She does have an aortic sclerosis murmur by exam. ECG is normal.  Can proceed with surgery at an overall moderate risk  from a cardiac standpoint.   Electronic Signatures: Kathlyn Sacramento (MD)  (Signed 17-Jul-15 19:28)  Co-Signer: General Aspect/Present Illness, History and Physical Exam, Home Medications, Allergies Rohith Fauth M (PA-C)  (Signed 17-Jul-15 15:51)  Authored: General Aspect/Present Illness, History and Physical Exam, Review of System, Family & Social History, Past Medical History, Home Medications, Labs, EKG , Radiology, Allergies, Vital Signs/Nurse's Notes, Impression/Plan   Last Updated: 17-Jul-15 19:28 by Kathlyn Sacramento (MD)

## 2015-02-12 NOTE — Discharge Summary (Signed)
PATIENT NAME:  Catherine Holmes, Catherine Holmes MR#:  [id number] DATE OF BIRTH:  July 02, 1928  DATE OF ADMISSION:  05/04/2014. DATE OF DISCHARGE:  05/05/2014.  DISCHARGE DIAGNOSES:  1.  Hypertensive urgency.  2.  Acute on chronic hyponatremia.   DISCHARGE MEDICATIONS:  1.  Dulera 5 mcg/100 mcg 2 puffs b.i.d.  2.  Gabapentin 100 mg capsules 2 times a day at lunch and bedtime.  3.  Hydralazine 50 mg p.o. b.i.d. May take an additional one-half tablet twice a day as needed.  4.  Spiriva 18 mcg 1 inhalation daily.  5.  Dexilant 60 mg p.o. daily.  6.  Aspirin 81 mg p.o. daily.  7.  Ocuvite as directed.  8.  Multivitamin as directed.  9.  Bystolic 5 mg p.o. daily.  10. Doxazosin 2 mg p.o. daily.  11. Vitamin D3 1000 international units daily.  12. Klor-Con 10 mEq p.o. daily as needed.  13. Zetia 10 mg p.o. daily.   CONSULTS: None.   PROCEDURES: None.   PERTINENT LABORATORY DATA: On day of discharge: sodium 125, potassium 4.2, creatinine 0.69, glucose 96. magnesium 1.2, cardiac enzymes negative. White blood cell count 7.2, hemoglobin 12.2 and platelets of 322,000.   BRIEF HOSPITAL COURSE:  1.  Hypertensive urgency. The patient initially came in with a blood pressure 201/98. She was placed back on her home regimen and will cover with hydralazine. Her blood pressures during her hospitalization were in the 130s-160s/70s. She was asymptomatic.  2.  Acute on chronic hyponatremia. Her baseline sodium is typically around 130. When she came in it was around 123. After further discussions it sounds like she is drinking a significant amount of soft drinks and free water.  I have asked her to restrict this intake, start drinking Gatorade G2. Will followup as an outpatient. She has no mental status changes and no focal deficits. Therefore, she can be managed as an outpatient. We will need to recheck a sodium level as an outpatient when she comes to the clinic. Will also need to consider physical therapy just for generalized  weakness, which is a chronic issue for her. She wants this done at [facility] so we can discuss this as an outpatient as well.   DISPOSITION: She is in stable condition and will be discharge to home.   FOLLOWUP:  Follow up with Dr. Burnadette PopLinthavong in 1 week.    ____________________________ Catherine IvanKanhka Odean Fester, MD kl:lt D: 05/05/2014 08:40:51 ET T: 05/05/2014 11:52:25 ET JOB#: [id number]  cc: Catherine IvanKanhka Allean Montfort, MD, <Dictator> Catherine IvanKANHKA Lorimer Tiberio MD ELECTRONICALLY SIGNED 05/16/2014 8:06

## 2015-02-12 NOTE — Consult Note (Signed)
Brief Consult Note: Diagnosis: Left femoral neck hip fracture.   Patient was seen by consultant.   Recommend to proceed with surgery or procedure.   Orders entered.   Comments: Patient is a pleasant 81 year old female with a PMHx significant for COPD, congestive heart failure, pulmonary hypertension, hypertension and diabetes who fell trying to put on her trousers while standing.  She lost her balance and fell.  She was unable to stand or ambulate after the injury.  Her daughter was at the bedside during my exam.  On exam, patient has a small superficial left knee abrasion.  She has shortening and external rotation to the left lower extremity.  Pedal pulses are palpable.  Sensation intact to light touch and has intact sensation to light touch throughout the left lower extremity.  Her xrays show a displaced left femoral neck hip fracture.  I have explained the injury to the patient and her daughter.  I used the whiteboard in her room to diagram the injury and proposed surgery.  The risks and benefits of surgical intervention were discussed in detail with the patient. The patient and her daughter have expressed understanding of the risks and benefits and agreed with plans for surgery. The risks include, but are not limited to: infection, bleeding requiring transfusion, nerve and blood vessel injury (especially the sciatic nerve leading to foot drop), dislocation, fracture, leg length discrepancy, change in lower extremity rotation,  need for more surgery including conversion to a total hip arthroplasty, DVT, and PE, MI, stroke, pneumonia, respiratory failure and death.  I have reviewed the labs and xrays in preparation for surgery.  I answered all questions by the patient and her daughter.  Surgery scheduled for tomorrow AM.  She is high risk for surgery per hospitalist.  Electronic Signatures: Juanell FairlyKrasinski, Susette Seminara (MD)  (Signed 17-Jul-15 15:35)  Authored: Brief Consult Note   Last Updated: 17-Jul-15 15:35  by Juanell FairlyKrasinski, Kimyatta Lecy (MD)

## 2015-02-12 NOTE — Consult Note (Signed)
General Aspect 20, yo  with hx of COPD, chronic diastolic CHF, HTN, DM and hyperlipidemia.  was admitted with severe dyspnea.  reports 2 day so fchest tightness - especially with any movement.   Present Illness denies any fever, has not been eating any extra salt. no CP ,  does not have a hx of CAD   Physical Exam:  GEN obese, having to work to take breaths   HEENT red conjunctivae   NECK supple  No masses   RESP postive use of accessory muscles   CARD Regular rate and rhythm  Normal, S1, S2   ABD denies tenderness  denies Flank Tenderness  normal BS   LYMPH negative neck   EXTR negative cyanosis/clubbing   NEURO cranial nerves intact   PSYCH alert, A+O to time, place, person   Review of Systems:  Subjective/Chief Complaint increasing dyspnea for several ldays   General: No Complaints   ENT: No Complaints   Respiratory: Short of breath  no fever   Cardiovascular: Tightness     IDA:    Hiatal Hernia: large per barium study results   Mitral Valve Prolapse:    TIA - Transient Ischemic Attack:    CHF:    sjogens syndrome:    Hyperlipidemia:    [place]:    CAD:    osteoporosis:    COPD:    gerd:    Diabetes Mellitus, Type II (NIDD):    htn:   Home Medications: Medication Instructions Status  spironolactone 50 mg oral tablet 1 tab(s) orally once a day Active  citalopram 20 mg oral tablet 1 tab(s) orally once a day at lunch Active  gabapentin 100 mg oral capsule 1 cap(s) orally 2 times a day at lunch and at bedtime Active  metFORMIN 500 mg oral tablet 1 tab(s) orally 2 times a day (with meals) Active  aspirin 81 mg oral tablet 1 tab(s) orally once a day Active  Ocuvite Antioxidant Multiple Vitamins and Minerals oral capsule 1 cap(s) orally 2 times a day Active  multivitamin 1 tab(s) orally once a day Active  hydrALAZINE 50 mg oral tablet 1 tab(s) orally 2 times a day (may take an additional 0.5 tablet twice a day as needed) Active   Spiriva 18 mcg inhalation capsule 1 each inhaled once a day Active  Dulera 5 mcg-100 mcg/inh inhalation aerosol 2 puff(s) inhaled 2 times a day Active  Dexilant 60 mg oral delayed release capsule 1 cap(s) orally once a day (in the evening) Active  Bystolic 5 mg oral tablet 1 tab(s) orally once a day Active  doxazosin 2 mg oral tablet 1 tab(s) orally once a day Active  Vitamin D3 1000 intl units oral capsule 1 cap(s) orally once a day Active  Bumex 2 milligram(s) orally once a day, As Needed Active  Klor-Con 10 10 mEq oral tablet, extended release 1 tab(s) orally once a day, As Needed Active  Zetia 10 mg oral tablet 1 tab(s) orally once a day Active   Lab Results: Hepatic:  04-Apr-15 20:23   Bilirubin, Total 0.3  Alkaline Phosphatase 97 (45-117 NOTE: New Reference Range 09/11/13)  SGPT (ALT) 38  SGOT (AST) 19  Total Protein, Serum 6.4  Albumin, Serum  3.3  Lab:  05-Apr-15 00:15   pH (ABG) 7.44  PCO2  53  PO2  43  FiO2 28  Base Excess  9.9  HCO3  36.0  O2 Saturation  82.0  O2 Device [place]  Specimen Site (ABG) RT  RADIAL  Specimen Type (ABG) ARTERIAL  Patient Temp (ABG) 37.0  Routine Chem:  04-Apr-15 20:23   Glucose, Serum  126  BUN 18  Creatinine (comp) 0.81  Sodium, Serum 136  Potassium, Serum 3.8  Chloride, Serum 98  CO2, Serum 32  Calcium (Total), Serum 8.5  Osmolality (calc) 275  eGFR (African American) >60  eGFR (Non-African American) >60 (eGFR values <35m/min/1.73 m2 may be an indication of chronic kidney disease (CKD). Calculated eGFR is useful in patients with stable renal function. The eGFR calculation will not be reliable in acutely ill patients when serum creatinine is changing rapidly. It is not useful in  patients on dialysis. The eGFR calculation may not be applicable to patients at the low and high extremes of body sizes, pregnant women, and vegetarians.)  Anion Gap  6  05-Apr-15 00:12   B-Type Natriuretic Peptide ([facility]  3652 (Result(s) reported on  24 Jan 2014 at 12:42AM.)    00:15   Result Comment - HAND DELIVERED  - dr pKerman Passey0030 02/23/2014 sdp  - NOTIFIED OF CRITICAL VALUE  Result(s) reported on 24 Jan 2014 at 12:27AM.  Cardiac:  04-Apr-15 20:23   Troponin I < 0.02 (0.00-0.05 0.05 ng/mL or less: NEGATIVE  Repeat testing in 3-6 hrs  if clinically indicated. >0.05 ng/mL: POTENTIAL  MYOCARDIAL INJURY. Repeat  testing in 3-6 hrs if  clinically indicated. NOTE: An increase or decrease  of 30% or more on serial  testing suggests a  clinically important change)  05-Apr-15 00:12   CK, Total 52 (26-192 NOTE: NEW REFERENCE RANGE  11/23/2013)  CPK-MB, Serum 1.3 (Result(s) reported on 24 Jan 2014 at 02:11AM.)    06:04   CK, Total 55 (26-192 NOTE: NEW REFERENCE RANGE  11/23/2013)  CPK-MB, Serum 1.8 (Result(s) reported on 24 Jan 2014 at 06:45AM.)  Troponin I < 0.02 (0.00-0.05 0.05 ng/mL or less: NEGATIVE  Repeat testing in 3-6 hrs  if clinically indicated. >0.05 ng/mL: POTENTIAL  MYOCARDIAL INJURY. Repeat  testing in 3-6 hrs if  clinically indicated. NOTE: An increase or decrease  of 30% or more on serial  testing suggests a  clinically important change)    09:32   CK, Total 50 (26-192 NOTE: NEW REFERENCE RANGE  11/23/2013)  CPK-MB, Serum 1.9 (Result(s) reported on 24 Jan 2014 at 09:59AM.)  Troponin I < 0.02 (0.00-0.05 0.05 ng/mL or less: NEGATIVE  Repeat testing in 3-6 hrs  if clinically indicated. >0.05 ng/mL: POTENTIAL  MYOCARDIAL INJURY. Repeat  testing in 3-6 hrs if  clinically indicated. NOTE: An increase or decrease  of 30% or more on serial  testing suggests a  clinically important change)  Routine UA:  05-Apr-15 00:13   Color (UA) Yellow  Clarity (UA) Clear  Glucose (UA) Negative  Bilirubin (UA) Negative  Ketones (UA) Negative  Specific Gravity (UA) 1.010  Blood (UA) Negative  pH (UA) 8.0  Protein (UA) Negative  Nitrite (UA) Negative  Leukocyte Esterase (UA) Trace (Result(s) reported on  24 Jan 2014 at 12:35AM.)  RBC (UA) <1 /HPF  WBC (UA) 11 /HPF  Bacteria (UA) 2+  Epithelial Cells (UA) <1 /HPF (Result(s) reported on 24 Jan 2014 at 12:35AM.)  Routine Hem:  04-Apr-15 20:23   WBC (CBC) 7.0  RBC (CBC)  3.65  Hemoglobin (CBC)  11.0  Hematocrit (CBC)  33.6  Platelet Count (CBC) 264 (Result(s) reported on 23 Jan 2014 at 08:40PM.)  MCV 92  MCH 30.1  MCHC 32.8  RDW  16.4   Radiology Results:  XRay:    04-Apr-15 20:53, Chest PA and Lateral  Chest PA and Lateral   REASON FOR EXAM:    chest tightness, dyspnea  COMMENTS:       PROCEDURE: DXR - DXR CHEST PA (OR AP) AND LATERAL  - Jan 23 2014  8:53PM     CLINICAL DATA:  Shortness of breath and chest tightness    EXAM:  CHEST  2 VIEW    COMPARISON:  Chest CT November 11, 2013 and chest radiograph November 10, 2013    FINDINGS:  There is underlying emphysematous change. The interstitium is  diffusely prominent ; suspect a degree of chronic congestive heart  failure. There is no airspace consolidation. Heart is prominent with  slight pulmonary venous hypertension. No adenopathy. There is a  sizable paraesophageal type hernia. There is calcification in the  left anterior descending coronary artery. There is marked collapse  of a midthoracic vertebral body. Bones are osteoporotic.     IMPRESSION:  Evidence of a degree of chronic congestive heart failure  superimposed on emphysematous change. Sizable paraesophageal hernia.  Stable marked collapse of a midthoracic vertebral body.      Electronically Signed    By: Lowella Grip M.D.    On: 01/23/2014 21:09         Verified By: Leafy Kindle. WOODRUFF, M.D.,    Singlet: Wheezing  Advair Diskus: Itching, Hives  Doxycycline: Hives  Glipizide: Hives  Lisinopril: Itching, Hives  Epinephrine: Itching, Hives  Levaquin: Muscles aches, N/V/Diarrhea  Ceclor: Other  Erythromycin: Unknown  Septra: Unknown  Tavist-D: Unknown  Penicillin: Unknown  Darvon:  Unknown  Codeine: Unknown  Zocor: Unknown  Forteo: Unknown  Statins: Muscles aches  Lasix: Itching  Vital Signs/Nurse's Notes: **Vital Signs.:   05-Apr-15 01:45  Vital Signs Type Admission  Temperature Temperature (F) 97.7  Celsius 36.5  Temperature Source oral  Pulse Pulse 78  Respirations Respirations 20  Systolic BP Systolic BP 882  Diastolic BP (mmHg) Diastolic BP (mmHg) 72  Mean BP 100  Pulse Ox % Pulse Ox % 96  Oxygen Delivery 2L    05:48  Vital Signs Type Routine  Temperature Temperature (F) 97.4  Celsius 36.3  Temperature Source oral  Pulse Pulse 83  Respirations Respirations 20  Systolic BP Systolic BP 800  Diastolic BP (mmHg) Diastolic BP (mmHg) 74  Mean BP 97  Pulse Ox % Pulse Ox % 96  Oxygen Delivery 2L    07:36  Vital Signs Type Routine  Temperature Temperature (F) 97.7  Celsius 36.5  Temperature Source oral  Pulse Pulse 85  Respirations Respirations 22  Systolic BP Systolic BP 349  Diastolic BP (mmHg) Diastolic BP (mmHg) 80  Mean BP 109  Pulse Ox % Pulse Ox % 95  Pulse Ox Activity Level  At rest  Oxygen Delivery 2.5l    09:18  Pulse Ox % Pulse Ox % 95  Pulse Ox Activity Level  At rest  Oxygen Delivery 2.5L    11:35  Vital Signs Type Routine  Temperature Temperature (F) 97.4  Celsius 36.3  Temperature Source oral  Pulse Pulse 69  Respirations Respirations 22  Systolic BP Systolic BP 179  Diastolic BP (mmHg) Diastolic BP (mmHg) 72  Mean BP 93  Pulse Ox % Pulse Ox % 93  Pulse Ox Activity Level  At rest  Oxygen Delivery 2.5L  *Intake and Output.:   05-Apr-15 01:45  Weight Type admission  Weight Method Bed  Current Weight (lbs) (lbs) 156.5  Current Weight (kg) (kg) 70.9  Height (ft) (feet) 5  Height (in) (in) 5  Height (cm) centimeters 165.1  BSA (m2) 1.7  BMI (kg/m2) 26    02:17  Grand Totals Intake:   Output:  300    Net:  -300 24 Hr.:  -300  Urine ml     Out:  300  Urinary Method  Void; BSC    06:02  Grand Totals Intake:    Output:  300    Net:  -300 24 Hr.:  -600  Urine ml     Out:  300  Urinary Method  Up to BR    Shift 07:00  Grand Totals Intake:   Output:  600    Net:  -600 24 Hr.:  -600  Urine ml     Out:  600  Length of Stay Totals Intake:   Output:  600    Net:  -600    Daily 07:00  Grand Totals Intake:   Output:  600    Net:  -600 24 Hr.:  -600  Urine ml     Out:  600  Length of Stay Totals Intake:   Output:  600    Net:  -600    08:43  Unmeasured Intake  Sips; Few bites    09:28  Grand Totals Intake:   Output:  250    Net:  -250 24 Hr.:  -250  Urine ml     Out:  250  Urinary Method  [facility]    Shift 15:00  Grand Totals Intake:   Output:  250    Net:  -250 24 Hr.:  -250  Urine ml     Out:  250  Length of Stay Totals Intake:   Output:  850    Net:  -850    Impression 1. acute on chronic diastolic CHF:  she presents with significant worsening of her dyspnea.  she may have some slight fluid overload but seems to be getting better on her aggressive COPD treatment.  Her BP is better.    I think she could be restarted on her home dose of Bumex and Kdur.   cardiac enzymes are negative  no additional recs at this time.  she seems to be doing well.   Electronic Signatures: Nahser, Dreama Saa (MD)  (Signed 05-Apr-15 13:55)  Authored: General Aspect/Present Illness, History and Physical Exam, Review of System, Past Medical History, Home Medications, Labs, Radiology, Allergies, Vital Signs/Nurse's Notes, Impression/Plan   Last Updated: 05-Apr-15 13:55 by Nahser, Dreama Saa (MD)

## 2015-02-12 NOTE — Discharge Summary (Signed)
PATIENT NAME:  Catherine Holmes, Catherine Holmes MR#:  [id number] DATE OF BIRTH:  07-10-1928  DATE OF ADMISSION:  01/24/2014 DATE OF DISCHARGE:  01/27/2014   DISCHARGE DIAGNOSES:  1. Acute respiratory failure secondary to chronic obstructive pulmonary disease exacerbation, without infiltrate on chest x-ray.  2. Stable chronic diastolic congestive heart failure.  3. Accelerated hypertension that has resolved.   DISCHARGE MEDICATIONS:  1. Citalopram 20 mg p.o. daily.  2. Gabapentin 100 mg 2 times a day at lunch and bedtime.  3. Metformin 500 mg p.o. b.i.d. with meals.  4. Hydralazine 50 mg b.i.d. She can take an additional 1/2 tab twice a day as needed if systolic blood pressure is greater than 180.  5. Spiriva 18 mcg 1 inhalation daily.  6. Dulera 5/100 two puffs b.i.d.  7. Dexilant 60 mg p.o. daily.  8. Spironolactone 50 mg p.o. daily.  9. Aspirin 81 mg p.o. daily.  10. Ocuvite antioxidant 1 capsule 2 times a day. 11. Multivitamin 1 tab daily.  12. Bystolic 5 mg p.o. daily.  13. Doxazosin 2 mg p.o. daily.  14. Vitamin D3 1000 international units daily.  15. Klor-Con 10 mEq p.o. daily as needed while taking diuretics.  16. Zetia 10 mg p.o. daily.   CONSULTATIONS: Cardiology per Antonieta Ibaimothy J. Gollan, MD.   PROCEDURES: None.   PERTINENT LABORATORY AND STUDIES: On day of discharge, sodium 130, potassium 3.2, creatinine 0.8, glucose 107. White blood cell count 8.5, hemoglobin 11.5, platelets 258. Cardiac enzymes were negative x3. Chest x-ray showed emphysematous changes with chronic signs of heart failure. She was, on 2 liters, 92%.   BRIEF HOSPITAL COURSE:  1. Acute respiratory failure. The patient initially came in with acute respiratory failure with some signs of chest discomfort consistent with a COPD exacerbation, but had a negative chest x-ray. No infiltrate was seen on chest x-ray. Therefore, she was not placed on any antibiotics. Instead, she was placed on steroid therapy and continued with her home  breathing medications. Her symptoms improved, and she returned to baseline on day of discharge. She did have signs of heart failure on her chest x-ray, but no signs of fluid overload. Therefore, she was not diuresed aggressively during her hospital stay. She was evaluated by Dr. Mariah MillingGollan, who recommended continued treatment that was placed. Will follow up as an outpatient. She is on a prednisone taper, and she is doing well with this and will continue this as an outpatient.  2. Other chronic issues are stable. Continue home regimen.   DISPOSITION: She is in stable condition and will be discharged to home with her 2 liters of O2 that she was on previously. Will follow up with Dr. Burnadette PopLinthavong within 10 days and Dr. Mariah MillingGollan within 14 days. May potentially start back the Bumex as an outpatient once her sodium and potassium come back up.   ____________________________ Catherine IvanKanhka Arlis Yale, MD kl:lb D: 01/27/2014 07:49:22 ET T: 01/27/2014 08:03:09 ET JOB#: [id number]  cc: Catherine IvanKanhka Lawayne Hartig, MD, <Dictator> Catherine IvanKANHKA Zebediah Beezley MD ELECTRONICALLY SIGNED 02/11/2014 10:44

## 2015-02-12 NOTE — Discharge Summary (Signed)
PATIENT NAME:  Catherine Holmes, Rikayla K MR#:  [id number] DATE OF BIRTH:  06-02-1928  DATE OF ADMISSION:  11/02/2013 DATE OF DISCHARGE:  11/16/2013  DISCHARGE DIAGNOSES:  1.  Hyponatremia.  2.  Acute on chronic diastolic congestive heart failure.  3.  Chronic obstructive pulmonary disease.  4.  Chronic anemia, iron deficient.  5.  Adult-onset diabetes.  6.  Hyperlipidemia.  7.  Osteoporosis.  8.  History of hypertension.  9.  History transient ischemic attack.  10. History of pulmonary nodule that is stable on CT.  11. History of Sjogren's syndrome.  12. Generalized weakness.   DISCHARGE MEDICATIONS:  1.  Citalopram 20 mg p.o. at lunchtime.  2.  Gabapentin 100 mg 1 capsule b.i.d. at lunch and bedtime.  3.  Nitrostat 0.4 sublingual 1 tab q. 5 minutes as needed for chest pain.  4.  Metformin 500 mg p.o. b.i.d. with meals.  5.  Hydralazine 50 mg p.o. b.i.d. for blood pressure.  6.  Spiriva 18 mcg 1 inhalation daily.  7.  Dulara 5 mcg 2 puffs b.i.d.  8.  Dexilant 60 mg p.o. daily.  9.  Spironolactone 50 mg p.o. daily. 10. Vicodin 300/5, 1 tab q.6 hours as needed for severe pain.  11. Albuterol inhaler 2 puffs q.4 hours as needed for wheezing.  12. Albuterol nebulizer 2.5 mg q.6 hours as needed for wheezing.  14. Aspirin 81 mg p.o. daily.  15. Ocuvite vitamin 2 times a day.  16. Multivitamin 1 tab daily.  17. Toprol 25 mg p.o. b.i.d.   MEDICATIONS TO HOLD: Bumex per nephrology at this time.   CONSULTANTS: Nephrology, cardiology and pulmonology.   PROCEDURES: None.   PERTINENT LABORATORY AND STUDIES: On day of discharge, sodium 129, potassium 3.8, BUN 29, creatinine 0.75. Last hemoglobin 11.8. PET scan was negative. MRI showed no pancreatic mass. CT showed stable pulmonary nodule.   BRIEF HOSPITAL COURSE:  1.  Hyponatremia. The patient initially came in with hyponatremia concerning for over diuresis and possible SIADH. Nephrology was consulted. The patient was initially placed on free  water restriction. Sodium did not improve with that.  Bumex was restarted at a point and did notice that her sodium did go down. Therefore, Bumex has been held because we are worried about intravascular dryness. We will hold the Bumex until seen by nephrology unless she has volume overload. We will need to have her sodium checked every 3 days for the next 2 to 3 weeks.  2.  Acute on chronic diastolic congestive heart failure. The patient initially came in with acute respiratory distress. Chest x-ray did not show significant pulmonary edema. With the patient's known respiratory failure secondary to COPD and diastolic congestive heart failure, we felt this was likely multifactorial along with underlying anemia. She was treated with supportive care and diuresed as tolerated. Continue on her COPD home breathing medications. Her hemoglobin did improve. Her breathing remained at baseline on 2 L. She was stable. Will continue on her home regimen at this time.  3.  Other chronic issues remain stable at this time. No change to those regimens except for her blood pressure medication. We did switch over to metoprolol 25 b.i.d. Will stop the amlodipine that was started during her hospital stay because she has had some low blood pressures. We will continue with hydralazine. She is allergic to LISINOPRIL, so we will stay away from ACE inhibitors and ARBs at this time. She has generalized weakness evaluated by physical therapy,  who did recommend SNF  placement. The patient is open to this She is going to go to [facility] for short-term rehab. Will need followup with Dr. Burnadette Pop in 3 days. Will follow up nephrology in 1 week. Her discharge planning did take over half an hour; therefore, we coded appropriately.  ____________________________ Marisue Ivan, MD kl:aw D: 11/16/2013 12:13:40 ET T: 11/16/2013 13:20:00 ET JOB#: [id number]  cc: Marisue Ivan, MD, <Dictator> Marisue Ivan MD ELECTRONICALLY SIGNED  12/11/2013 10:51

## 2015-02-12 NOTE — H&P (Signed)
PATIENT NAME:  Catherine Holmes, Catherine Holmes MR#:  [id number] DATE OF BIRTH:  1928-09-29  DATE OF ADMISSION:  01/24/2014  PRIMARY CARE PHYSICIAN:  Dr. Burnadette Pop.   REFERRING EMERGENCY ROOM PHYSICIAN:  Dr. Lenard Lance.   CHIEF COMPLAINT:  Chest tightness and shortness of breath.   HISTORY OF PRESENT ILLNESS:  The patient is an 81 year old female with a past medical history of chronic diastolic congestive heart failure, COPD, hypertension, diabetes mellitus and hyperlipidemia, is presenting to the ER with a chief complaint of chest tightness and shortness of breath.  The patient is reporting that for the past two days she is feeling tight in her chest as if somebody is tightening her chest with a rubber band.  Denies any nausea.  Denies any fevers.  Complaining of two day history of dry cough.  Denies any dizziness or loss of consciousness.  The patient is saturating 80% on exertion and 89% to 90% while resting on 2 liters.  The patient's chest x-ray did not reveal any CHF or infiltrates.  She was given 125 mg of IV Solu-Medrol and 10 of IV hydralazine for malignant hypertension.  The patient denies any headache, dizziness or loss of consciousness.  No family members at bedside.   PAST MEDICAL HISTORY:  COPD, diastolic congestive heart failure, diabetes mellitus, hypertension, hyperlipidemia, neuropathy, Sjogren's syndrome, on steroids.    ALLERGIES:  THE PATIENT IS ALLERGIC TO MULTIPLE MEDICATIONS INCLUDING ADVAIR, EPINEPHRINE, TETRACYCLINE, FORTEO, GLIPIZIDE, LASIX, LEVAQUIN, LISINOPRIL, PENICILLIN, SEPTRA, SINGULAIR, STATINS AND ZOCOR.   PAST SURGICAL HISTORY:  None.   PSYCHOSOCIAL HISTORY:  Former smoker.  She has 20 to 30 pack-year history of smoking.  Lives at home with daughter.  Denies any history of alcohol or illicit drug usage.   FAMILY HISTORY:  Both mother and father deceased from MI.  REVIEW OF SYSTEMS:   CONSTITUTIONAL:  Denies any fever, fatigue.  Denies any weight loss or weight gain.  EYES:   Denies blurry vision, double vision, glaucoma.  EARS, NOSE, THROAT:  Denies epistaxis, discharge, snoring or post nasal drip.  RESPIRATION:  Complaining of dry cough.  Has a chronic history of COPD.  CARDIOVASCULAR:  Complaining of chest tightness.  Denies syncope or palpitations.  GASTROINTESTINAL:  Denies nausea, vomiting, diarrhea, abdominal pain.  GENITOURINARY:  No dysuria, hematuria.  ENDOCRINE:  Denies polyuria, nocturia.  Has chronic history of diabetes mellitus.  HEMATOLOGIC AND LYMPHATIC:  No anemia, easy bruising, bleeding.  INTEGUMENTARY:  No acne, rash, lesions.  MUSCULOSKELETAL:  No joint pain in the neck and back.  Denies gout.  NEUROLOGIC:  Denies vertigo, ataxia.  PSYCHIATRIC:  No ADD, OCD, insomnia, anxiety.   PHYSICAL EXAMINATION: VITAL SIGNS:  Temperature 98.3, pulse 75, respirations 18, blood pressure 179/69, pulse ox is 90%.  GENERAL APPEARANCE:  Not under acute distress.  Moderately built and nourished.  HEENT:  Normocephalic, atraumatic.  Pupils are equally reacting to light and accommodation.  No scleral icterus.  No conjunctival injection.  No sinus tenderness.  No postnasal drip.  Moist mucous membranes.  NECK:  Supple.  No JVD.  No thyromegaly.  LUNGS:  Moderate air entry and expiratory wheezing is present.  CARDIAC:  S1, S2 normal.  Regular rate and rhythm.  No murmurs.  No anterior chest wall tenderness on palpation.   GASTROINTESTINAL:  Soft.  Bowel sounds are positive in all four quadrants.  Nontender, nondistended.  No hepatosplenomegaly.  No masses felt.   NEUROLOGIC:  Awake, alert, oriented x 3.  Motor and sensory grossly intact.  Reflexes are 2+.  EXTREMITIES:  No edema.  No cyanosis.  No clubbing.  SKIN:  Warm to touch.  Normal turgor.  No rashes.  No lesions.  MUSCULOSKELETAL:  No joint effusion, tenderness, erythema.  PSYCHIATRIC:  Normal mood and affect.   LABORATORY AND IMAGING STUDIES:  Chest x-ray PA and lateral views:  Evidence of chronic  congestive heart failure, superimposed on emphysematous changes, sizable paraesophageal hernia.  Stable marked collapse of the midthoracic vertebral body.  A 12-lead EKG, normal sinus rhythm at 67 beats per minute, normal PR and QRS intervals.  No acute ST-T wave changes.  Glucose is 126.  BNP 3652.  Anion gap is 6.  The rest of the Chem-8 is normal.  LFTs are normal except albumin at 3.3.  Troponin less than 0.02.  WBC 7.0, hemoglobin 11.0, hematocrit 33.6, platelets 264.  Urinalysis:  Nitrites and leukocyte esterase are negative, pH 7.44, pCO2 53, pO2 43, FIO2 28, bicarb is 36, pulse ox 82%.   ASSESSMENT AND PLAN:  An 81 year old female presenting to the ER with a chief complaint of chest tightness for one day, dry cough for two days associated with shortness of breath will be admitted with the following assessment and plan.  1.  Chest tightness, probably from malignant hypertension.  We will put her on telemetry.   Cycle cardiac biomarkers.  Start her on blood pressure medications and up-titrate the medications.  We will provide her intravenous Lopressor versus hydralazine as needed basis.  Cardiology consult is placed to [facility].  2.  Acute exacerbation of chronic obstructive pulmonary disease.  We will provide her intravenous Solu-Medrol and THE PATIENT BEING ALLERGIC TO ADVAIR, we will provide her only albuterol treatments at this time.  3.  Chronic history of congestive heart failure.  No fluid overload at this time.  We will resume her home medications after medication reconciliation.  4.  Diabetes mellitus.  Continue sliding scale insulin.   5.  We will provide her gastrointestinal and deep vein thrombosis prophylaxis.  6.  CODE STATUS:  SHE IS FULL CODE.   Plan of care discussed with the patient.  The patient will be transferred to Dr. Sherryll BurgerLinthavong's service in a.m.   Total time spent on the admission is 50 minutes.     ____________________________ Ramonita LabAruna Saryah Loper,  MD ag:ea D: 01/24/2014 01:18:56 ET T: 01/24/2014 02:23:35 ET JOB#: [id number]  cc: Ramonita LabAruna Ramond Darnell, MD, <Dictator> Ramonita LabARUNA Meribeth Vitug MD ELECTRONICALLY SIGNED 02/11/2014 7:06

## 2015-02-12 NOTE — Consult Note (Signed)
General Aspect Catherine Holmes is a 81yo Caucasian female w/ COPD, HFpEF, chronic normocytic anemia, PAD s/p bilateral iliac stenting, moderate RAS, nonobstructive carotid artery dz and sulfa allergy to loop diuretics who was admitted to [facility] 11/02/13 for acute on chronic respiratory failure.  Followed up with Dr. Mariah MillingGollan 10/13/13. Baseline weight noted to be 148 lbs. She was 155 lbs at that time. She was continued on Bumex daily.   She reported experiencing progressively worsening DOE and increased use of inhalers despite oxygen therapy leading up to admission. She reports mild LE edema. She denies chest pressure, orthopnea, PND. She notes intermittent palpitations. Per H&P, these symptoms developed after an iron infusion. Her symptoms continued to worsen and she presented [facility] ED.   Present Illness There, EKG revealed NSR. Initial TnI WNL. BNP 2K. CXR showed stable marked cardiomegaly and large hiatal hernia. Stable COPD. No acute cardiopulmonary disease. She was admitted by the medicine team and started on IV Bumex. TnI x 3 WNL.   Total I/O - 4500 mL today. Na has continued to trend down, however to 124 today due to diuresis. Renal has been consulted for further management. Secondary SIADH from COPD suspected. She was placed on an aquaretic conivaptan gtt.   Echo- LVEF 65-70%, mild LVH, mild-mod biatrial enlargement, mild MR/TR, mild-mod aortic sclerosis w/o stenosis.   COPD managed by primary team.   Cardiology consulted for CHF, diuresis and hyponatremia recs.   PAST MEDICAL HISTORY:  1.  Hypertension.  2.  Diabetes mellitus.  3.  Hyperlipidemia.  4.  Diastolic congestive heart failure. 5.  COPD, oxygen dependent.  6.  Neuropathy.  ALLERGIES: MULTIPLE ALLERGIES OF ADVAIR, EPINEPHRINE, TETRACYCLINE, FORTEO, GLIPIZIDE, LASIX, LEVAQUIN, LISINOPRIL, PENICILLIN, SEPTRA, SINGULAIR, STATINS AND ZOCOR.   SOCIAL HISTORY: Former smoker. She has a 20 to 30 pack-year history of smoking. No  history of alcohol or drug use. Lives at home with her daughter.   FAMILY HISTORY: Both mother and father deceased from MI.   Physical Exam:  GEN no acute distress, obese   HEENT pink conjunctivae, PERRL, hearing intact to voice   NECK supple  No masses  trachea midline  JVP to earlobes, no bruits   RESP postive use of accessory muscles  bibasilar rales, scattered rhonchi, no appreciable wheezes   CARD Regular rate and rhythm  II/VI systolic murmur at RUSB   ABD denies tenderness  soft  normal BS   EXTR negative cyanosis/clubbing, negative edema   SKIN normal to palpation, skin turgor good   NEURO follows commands, motor/sensory function intact   PSYCH alert, A+O to time, place, person   Review of Systems:  Subjective/Chief Complaint shortness of breath   General: Fatigue  Weakness   Respiratory: Short of breath   Cardiovascular: Palpitations  Dyspnea   Review of Systems: All other systems were reviewed and found to be negative   Home Medications: Medication Instructions Status  spironolactone 50 mg oral tablet 1 tab(s) orally once a day Active  bumetanide 2 mg oral tablet 1 tab(s) orally 2 times a day Active  citalopram 20 mg oral tablet 1 tab(s) orally once a day at lunch Active  gabapentin 100 mg oral capsule 1 cap(s) orally 2 times a day at lunch and at bedtime Active  hydrALAZINE 50 mg oral tablet 1 tab(s) orally 2 times a day (may take an additional 0.5 tablet twice a day as needed) Active  Bystolic 5 mg oral tablet 1 tab(s) orally once a day Active  Spiriva 18 mcg inhalation capsule 1 each inhaled once a day Active  Dulera 5 mcg-100 mcg/inh inhalation aerosol 2 puff(s) inhaled 2 times a day Active  Tylenol PM 500 mg-25 mg oral tablet 1 tab(s) orally once a day (at bedtime) Active  Dexilant 60 mg oral delayed release capsule 1 cap(s) orally once a day (in the evening) Active  potassium chloride 10 mEq oral tablet, extended release 0.5 tab(s) orally 2 times a  day Active  metFORMIN 500 mg oral tablet 1 tab(s) orally 2 times a day (with meals) Active  Vicodin 300 mg-5 mg oral tablet 1 tab(s) orally every 6 hours, As Needed Active  albuterol CFC free 90 mcg/inh inhalation aerosol 2 puff(s) inhaled 4 times a day Active  albuterol 2.5 mg/3 mL (0.083%) inhalation solution 3 milliliter(s) inhaled every 6 hours Active  arformoterol 15 mcg/2 mL inhalation solution 2 milliliter(s) inhaled 2 times a day Active  aspirin 81 mg oral tablet 1 tab(s) orally once a day Active  Ocuvite Antioxidant Multiple Vitamins and Minerals oral capsule 1 cap(s) orally 2 times a day Active  multivitamin 1 tab(s) orally once a day Active  Nitrostat 0.4 mg sublingual tablet 1 tab(s) sublingual every 5 minutes, As Needed - for Chest Pain Active   Lab Results:  Thyroid:  13-Jan-15 04:20   Thyroid Stimulating Hormone 1.49 (0.45-4.50 (International Unit)  ----------------------- Pregnant patients have  different reference  ranges for TSH:  - - - - - - - - - -  Pregnant, first trimetser:  0.36 - 2.50 uIU/mL)  Routine Chem:  11-Jan-15 23:17   Sodium, Serum  129  13-Jan-15 04:20   Sodium, Serum  128  14-Jan-15 05:29   Sodium, Serum  126  15-Jan-15 04:12   Sodium, Serum  125    16:55   Sodium, Serum  124 (Result(s) reported on 05 Nov 2013 at 05:33PM.)  Cardiac:  11-Jan-15 23:17   CK, Total 64  CPK-MB, Serum 2.1 (Result(s) reported on 02 Nov 2013 at 02:57AM.)  Troponin I < 0.02 (0.00-0.05 0.05 ng/mL or less: NEGATIVE  Repeat testing in 3-6 hrs  if clinically indicated. >0.05 ng/mL: POTENTIAL  MYOCARDIAL INJURY. Repeat  testing in 3-6 hrs if  clinically indicated. NOTE: An increase or decrease  of 30% or more on serial  testing suggests a  clinically important change)  12-Jan-15 03:40   CK, Total 49  CPK-MB, Serum 2.0 (Result(s) reported on 02 Nov 2013 at 04:23AM.)  Troponin I 0.03 (0.00-0.05 0.05 ng/mL or less: NEGATIVE  Repeat testing in 3-6 hrs  if  clinically indicated. >0.05 ng/mL: POTENTIAL  MYOCARDIAL INJURY. Repeat  testing in 3-6 hrs if  clinically indicated. NOTE: An increase or decrease  of 30% or more on serial  testing suggests a  clinically important change)    07:04   CK, Total 47  CPK-MB, Serum 2.0 (Result(s) reported on 02 Nov 2013 at 07:35AM.)  Troponin I 0.03 (0.00-0.05 0.05 ng/mL or less: NEGATIVE  Repeat testing in 3-6 hrs  if clinically indicated. >0.05 ng/mL: POTENTIAL  MYOCARDIAL INJURY. Repeat  testing in 3-6 hrs if  clinically indicated. NOTE: An increase or decrease  of 30% or more on serial  testing suggests a  clinically important change)  Routine Hem:  11-Jan-15 23:17   Hemoglobin (CBC)  8.4  Hematocrit (CBC)  25.4  13-Jan-15 04:20   Hemoglobin (CBC)  8.7  Hematocrit (CBC)  26.0  14-Jan-15 05:29   Hemoglobin (CBC)  8.5  Hematocrit (CBC)  26.0  15-Jan-15 04:12   Hemoglobin (CBC)  9.4  Hematocrit (CBC)  28.1   EKG:  Interpretation NSR, poor R wave progression, LAE, no ST/T changes   Rate 69   Radiology Results: XRay:    13-Jan-15 09:34, Chest PA and Lateral  Chest PA and Lateral   REASON FOR EXAM:    dyspnea  COMMENTS:       PROCEDURE: DXR - DXR CHEST PA (OR AP) AND LATERAL  - Nov 03 2013  9:34AM     CLINICAL DATA:  Dyspnea, history of in the cardiomegaly and COPD    EXAM:  CHEST  2 VIEW    COMPARISON:  Portable chest x-ray ofJanuary 11, 2015    FINDINGS:  The lungs are well-expanded. The pulmonary interstitial markings are  increased but not greatly changed from the previous study. The  cardiopericardial silhouette remains enlarged. The pulmonary  vascularity is mildly prominent centrally though stable. A large  hiatal hernia is present. There is no pleural effusion. The  mediastinum is normal in width. There is high-grade wedge  compression of the body of approximately T8.     IMPRESSION:  1. There is stable enlargement of the cardiopericardial silhouette.  A  stable large hiatal hernia is present.  2. The pulmonary interstitial markings remain increased and may be  slightly more conspicuous today suggesting mild pulmonary edema.  3. There is no alveolar pneumonia nor significant pleural effusion.      Electronically Signed    By: David  [place]    On: 11/03/2013 09:40         Verified By: DAVID A. [place], M.D., MD  Cardiology:    13-Jan-15 19:17, Echo Doppler  Echo Doppler   REASON FOR EXAM:      COMMENTS:       PROCEDURE: [facility] - ECHO DOPPLER COMPLETE(TRANSTHOR)  - Nov 03 2013  7:17PM     RESULT: Echocardiogram Report    Patient Name:   SHANICQUA COLDREN Date of Exam: 11/03/2013  Medical Rec #:  [id number]         Custom1:  Date of Birth:  08-07-1928      Height:       64.0 in  Patient Age:    81 years       Weight:       150.0 lb  Patient Gender: F              BSA:          1.73 m??    Indications: CHF  Sonographer:    Nestor Ramp RCS  Referring Phys: Marisue Ivan    Summary:   1. Left ventricular ejection fraction, by visual estimation, is 65 to   70%.   2. Normal global left ventricular systolic function.   3. Mild left ventricular hypertrophy.   4. Decreased left ventricular internal cavity size.   5. Moderately dilated left atrium.   6. Mildly dilated right atrium.   7. Mild mitral valve regurgitation.   8. Mild to moderate aortic valve sclerosis/calcification without any   evidence of aortic stenosis.   9. Moderate to severely elevated pulmonary artery systolic pressure.  10. Mild tricuspid regurgitation.  2D AND M-MODE MEASUREMENTS (normal ranges within parentheses):  Left Ventricle:          Normal  IVSd (2D):      1.25 cm (0.7-1.1)  LVPWd (2D):     1.27 cm (0.7-1.1) Aorta/LA:  Normal  LVIDd (2D):     3.40 cm (3.4-5.7) Aortic Root (2D): 2.70 cm (2.4-3.7)  LVIDs (2D):     1.57 cm           Left Atrium (2D): 5.10 cm (1.9-4.0)  LV FS (2D):     53.8 %   (>25%)  LV EF (2D):     85.6 %   (>50%)                                     Right Ventricle:                                    RVd (2D):        4.43 cm  LV DIASTOLIC FUNCTION:  MV Peak E: 1.85 m/s E/e' Ratio: 16.50  MV Peak A: 1.31 m/s Decel Time: 239 msec  E/A Ratio: 1.41  SPECTRAL DOPPLER ANALYSIS (where applicable):  Mitral Valve:  MV P1/2 Time: 69.31 msec  MV Area, PHT: 3.17 cm??  Tricuspid Valve and PA/RV Systolic Pressure: TR Max Velocity: 3.89 m/s RA   Pressure: 5 mmHg RVSP/PASP: 65.5 mmHg    PHYSICIAN INTERPRETATION:  Left Ventricle: The left ventricular internal cavity size was decreased.   LV posterior wall thickness was normal. Mild left ventricular   hypertrophy. Global LV systolic function was normal. Left ventricular   ejection fraction, by visual estimation, is 65 to 70%.  Right Ventricle: The right ventricular size is mildly enlarged. Global RV   systolic function is normal.  Left Atrium: The left atrium is moderately dilated.  Right Atrium: The right atrium is mildly dilated.  Pericardium: There is no evidence of pericardial effusion.  Mitral Valve: The mitral valve is normal in structure. Mild mitral valve   regurgitation is seen. Mildly thickened.  Tricuspid Valve: The tricuspid valve is normal. Mild tricuspid   regurgitation is visualized. The tricuspid regurgitant velocity is 3.89   m/s, and with an assumed right atrial pressure of 5 mmHg, the estimated   right ventricular systolic pressure is moderately elevated at 65.5 mmHg.  Aortic Valve: Mild to moderate aortic valve sclerosis/calcification is   present, without any evidence of aortic stenosis. Trivial aortic valve   regurgitation is seen.  Pulmonic Valve: The pulmonic valve is normal. Trace pulmonic valve   regurgitation.  Aorta: The aortic root is normal in size and structure.    [id number] Julien Nordmann MD  Electronically signed by [id number] Julien Nordmann MD  Signature Date/Time: 11/04/2013/11:20:19 AM    *** Final ***    IMPRESSION:  .        Verified By: Antonieta Iba, M.D., MD    Singlet: Wheezing  Advair Diskus: Itching, Hives  Doxycycline: Hives  Glipizide: Hives  Lisinopril: Itching, Hives  Epinephrine: Itching, Hives  Levaquin: Muscles aches, N/V/Diarrhea  Ceclor: Other  Erythromycin: Unknown  Septra: Unknown  Tavist-D: Unknown  Penicillin: Unknown  Darvon: Unknown  Codeine: Unknown  Zocor: Unknown  Forteo: Unknown  Statins: Muscles aches  Lasix: Itching  Vital Signs/Nurse's Notes: **Vital Signs.:   15-Jan-15 16:32  Vital Signs Type Post Medication  Systolic BP Systolic BP 163  Diastolic BP (mmHg) Diastolic BP (mmHg) 83  Mean BP 109    Impression 81yo Caucasian female w/ COPD, HFpEF, chronic normocytic anemia, PAD s/p bilateral iliac stenting, moderate RAS, nonobstructive carotid  artery dz and sulfa allergy to loop diuretics who was admitted to [facility] 11/02/13 for acute on chronic respiratory failure.  1. Acute on chronic respiratory failure Multifactorial due to acute on chronic diastolic CHF on a background of COPD and chronic anemia.  -- H/H stable, followed by hem/onc outpatient. No abnormal bleeding.  -- COPD management per primary team  2. Acute on chronic diastolic CHF After iron infusion, question associated fluid overload. EF preserved on echo. Chronic diastolic CHF history. She has had significant UOP with diuresis, however this has been limited by worsening hyponatremia. Aquaretic initiated by nephrology. 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  -- Follow volume status, Na with aquaretic -- She has tolerated Bumex well in the past w/ good result. Would like to resume going forward. Diuretic therapy has been limited in the past due to sulfa intolerances from loop diuretics.  -- Adhere to non-pharmacologic CHF management -- Control BP. Question if renal artery stenosis playing a role. Agree with amlodipine.  -- Avoid hypoxia -- Correct metabolic derangements  3. Large hiatal  hernia -- Follow-up PCP/GI as outpatient -- PPI for reflux symptoms  4. Normocytic anemia Would keep Hgb >8-9 to avoid increasing cardiac output -- Continue to follow while inpatient -- Managed by hem/onc as outpatient  5. Carotid artery disease Nonobstructive by u/s last year. L ICA stenosis 60-79%.  -- Follow-up u/s as outpatient   Electronic Signatures for Addendum Section:  Lorine Bears (MD) (Signed Addendum 15-Jan-15 18:40)  The patient was seen and examined. Agree with the above. She presented with acute on chronic diastolic heart failure with hyponatermia. Echo showed hyperdynamic LV function with significant pulmonary hypertension.  Agree with diuresis. Switch Coreg to Metoprolol in oder to slow the heart and improve cardiac relaxation.   Electronic Signatures: Gery Pray (PA-C)  (Signed 15-Jan-15 17:59)  Authored: General Aspect/Present Illness, History and Physical Exam, Review of System, Home Medications, Labs, EKG , Radiology, Allergies, Vital Signs/Nurse's Notes, Impression/Plan Lorine Bears (MD)  (Signed 15-Jan-15 18:40)  Co-Signer: General Aspect/Present Illness, History and Physical Exam, Review of System, Home Medications, Labs, EKG , Radiology, Allergies, Vital Signs/Nurse's Notes, Impression/Plan   Last Updated: 15-Jan-15 18:40 by Lorine Bears (MD)

## 2015-02-12 NOTE — H&P (Signed)
PATIENT NAME:  Catherine Holmes, Catherine Holmes MR#:  [id number] DATE OF BIRTH:  Nov 09, 1927  DATE OF ADMISSION:  05/07/2014  PRIMARY CARE PROVIDER: Dr. Burnadette Pop   EMERGENCY DEPARTMENT REFERRING PHYSICIAN: Dr. Glennie Isle   CHIEF COMPLAINT: Fall, left-sided hip fracture.   HISTORY OF PRESENT ILLNESS: The patient is an 81 year old white female with known history of hypertension, COPD, diastolic CHF, moderate to severe pulmonary hypertension, hyperlipidemia and Sjogren syndrome who was last hospitalized here on 05/04/2014 with accelerated hypertension. She was discharged the same day after blood pressure improving. The patient also was noted to have hyponatremia as well, that improved as well, who reports that she was trying to change her undergarments earlier today and instead of sitting on the bed she stood up and lost balance and fell and sustained a left hip fracture. The patient has multiple medical problems. We are asked to admit the patient. She has limited functional capacity with getting short of breath even going to the bathroom. She uses oxygen only at nighttime and sometimes during the day. She otherwise denies any chest pain, palpitations. Has chronic shortness of breath. Denies any fevers, chills. Denies any lower extremity swelling. Denies any orthopnea or nocturnal dyspnea. Denies any abdominal pain or any urinary frequency, urgency or hesitancy.   PAST MEDICAL HISTORY: 1.  COPD. 2.  Diastolic CHF. 3.  Pulmonary hypertension, which was described as moderate to severe, based on the last echo that she had.  4.  Diabetes.  5.  Hypertension.  6.  Hyperlipidemia.  7.  Neuropathy.  8.  Sjogren syndrome.  9.  Hiatal hernia. 10.  Abdominal hernia.   ALLERGIES: To multiple medications including ADVAIR, EPINEPHRINE, TETRACYCLINE, FORTEO, GLIPIZIDE, LASIX, LEVAQUIN, LISINOPRIL, PENICILLIN, SEPTRA, SINGULAIR, STATIN AND ZOCOR.   PAST SURGICAL HISTORY: Hysterectomy and appendectomy.   CURRENT  MEDICATIONS: She is on Bystolic 5 mg daily, potassium chloride 10 mEq half tablet 2 times a day as needed, doxazosin 4 mg daily, hydralazine 50 two tabs as needed, metformin 500 as needed, Bumex 1 mg as needed for swelling, citalopram 20 daily, gabapentin 100 mg 1 tab p.o. b.i.d., Dexilant 60 daily, Zetia 10 daily, calcium plus vitamin D 2 tabs daily, multivitamin daily, vitamin D3 1000 international units daily, eye vitamins 2 tabs daily, Ecotrin 81 one tab p.o. daily, hydrocodone/acetaminophen p.r.n. for pain, Spiriva 1 inhalation daily, Dulera 2 puffs b.i.d., Tylenol PM at nighttime.  SOCIAL HISTORY: Used to smoke, none now. Lives with her daughter. No history of alcohol or illicit drug use.   FAMILY HISTORY: Both mother and father are deceased from MI.   REVIEW OF SYSTEMS: CONSTITUTIONAL: Denies any fevers. Complains of chronic fatigue and weakness. No pain. No weight loss. No weight gain.  EYES: No blurred or double vision. No pain. No redness. No inflammation.  ENT: No tinnitus, ear pain, hearing loss or epistaxis.  RESPIRATORY: Has chronic cough. Has COPD and pulmonary hypertension. No hemoptysis.  CARDIOVASCULAR: Denies any chest pain, palpitations. No arrhythmias.  GASTROINTESTINAL: Did have some nausea and did have some diarrhea as well. No abdominal pain. No hematemesis. No hematochezia. GENITOURINARY: Denies any frequency, urgency or hesitancy.  ENDOCRINE: Denies any polyuria, polydipsia, heat or cold intolerance.  HEMATOLOGIC: Denies anemia, easy bruisability or bleeding.  SKIN: No acne. No rash.  MUSCULOSKELETAL: Complains of pain in the left lower extremity.  NEUROLOGIC: Denies any vertigo, ataxia, CVA, TIA or seizures.  PSYCHIATRIC: Denies anxiety, insomnia or depression.   PHYSICAL EXAMINATION: VITAL SIGNS: Temperature 97.7, pulse 65, respirations 20, blood  pressure 129/48, O2 93% on 2 liters.  GENERAL: The patient is a chronically ill-appearing female in no acute distress.   HEENT: Head atraumatic, normocephalic. Pupils equally round and reactive to light and accommodation. There is no conjunctival pallor. No scleral icterus.  NECK: Supple without any thyromegaly. No JVD and no carotid bruits.  LUNGS: Clear to auscultation bilaterally without any rales, rhonchi, or wheezing.  HEART: S1 and S2 positive. There is a systolic murmur at the left sternal border.  ABDOMEN: Soft, nontender and nondistended. Positive bowel sounds x4.  EXTREMITIES: No clubbing, cyanosis, or edema.  SKIN: No rash.  LYMPHATICS: No lymph nodes palpable.  VASCULAR: Good DP and PT pulses.  PSYCHIATRIC: Not anxious or depressed. NEUROLOGIC: Awake, alert and oriented x3. No focal deficits.   DIAGNOSTIC DATA: In the ED, glucose was 108, BUN 23, creatinine 0.69, sodium 129, potassium 4.4, chloride 93, CO2 30 and calcium 8.6. LFTs are normal. WBC 7.5, hemoglobin 12.1, platelet count 295,000. INR 0.9.   Chest x-ray shows no active disease, mild vascular congestion and cephalization without overt edema. Stable cardiomegaly. Moderate large hiatal hernia. COPD and emphysematous changes.   EKG: Normal sinus rhythm without any ST-T wave changes.   ASSESSMENT AND PLAN: The patient is an 81 year old status post fall with left hip fracture.  1.  Preop for left hip fracture. The patient with left hip fracture, multiple medical problems including moderate to severe pulmonary hypertension, history of diastolic congestive heart failure and chronic obstructive pulmonary disease. Has poor functional capacity. She is at a very high risk of cardiopulmonary complications as a result of her underlying cardiopulmonary disease. At this time, I will ask her cardiologist to see, however, there is no further work-up that needs to be done in this patient. She may proceed to surgery with the understanding at high risk of complications including death. The anesthesiologist and orthopedics also will discuss with the patient  regarding this.  2.  Diabetes. Will place her on sliding scale. 3.  Hypertension. Blood pressure normal now. We will hold antihypertensives. 4.  Chronic obstructive pulmonary disease. We will continue Spiriva. We will add p.r.n. nebs.  5.  History of diastolic congestive heart failure with chest x-ray without overt congestive heart failure. Monitor for now. Follow ins and outs.  6.  Hyponatremia, appears to be chronic. It is better than her last admission. Will just monitor for now.  The patient wants everything done. She is worried about anesthesia and not waking up from that. At this time, we will leave her FULL code. This may need to be addressed if her condition worsens.   TIME SPENT ON ADMISSION: 55 minutes.  ____________________________ Lacie ScottsShreyang H. Allena KatzPatel, MD shp:sb D: 05/07/2014 10:08:08 ET T: 05/07/2014 10:33:02 ET JOB#: [id number]  cc: Christa Fasig H. Allena KatzPatel, MD, <Dictator> Charise CarwinSHREYANG H Jami Ohlin MD ELECTRONICALLY SIGNED 05/12/2014 12:32

## 2015-02-12 NOTE — Consult Note (Signed)
PATIENT NAME:  Catherine Holmes, Catherine Holmes MR#:  [id number] DATE OF BIRTH:  26-Jul-1928  DATE OF CONSULTATION:  11/05/2013  REFERRING PHYSICIAN:  Marisue Ivan, MD CONSULTING PHYSICIAN:  Diasia Henken Lizabeth Leyden, MD  REASON FOR CONSULTATION: Hyponatremia.   HISTORY OF PRESENT ILLNESS: The patient is a very pleasant 81 year old Caucasian female with past medical history of hypertension, diabetes mellitus, hyperlipidemia, diastolic congestive heart failure, COPD with oxygen dependence, peripheral neuropathy, who presented to [facility] with shortness of breath. The patient reports that she was having shortness of breath 3 to 4 days prior to admission. The shortness of breath was progressively worsening. She is known to be on home oxygen;  however, despite this, her shortness of breath persisted and worsened. Exertion certainly made the shortness of breath worse. She was having several episodes of chest pain. Denied cough or hemoptysis. We are now consulted for the evaluation and management of hyponatremia.   Upon presentation, the patient's serum sodium was found to be 130. It appears that she has some element of chronic hyponatremia. Serum sodium is now down to 125. As above, she has known underlying COPD which often leads to SIADH. She has known hypothyroidism; however, this appears to be well controlled with a TSH of 1.49. Her blood osmolality is indeed low at 270. Urine osmolality is elevated at 457. This picture could be consistent with SIADH. Earlier, she was on diuretic therapy; however, this has been held since serum sodium started to drop. The patient is currently awake and alert and appears to have intact mentation.   PAST MEDICAL HISTORY:  1.  Hypertension.  2.  Diabetes mellitus.  3.  Hyperlipidemia.  4.  Diastolic congestive heart failure.  5.  COPD with home oxygen dependence.  6.  Peripheral neuropathy.   ALLERGIES: ADVAIR DISKUS, CECLOR, CODEINE, DARVON, DOXYCYCLINE,  EPINEPHRINE, ERYTHROMYCIN, FORTEO, GLIPIZIDE, LASIX, LEVAQUIN, PENICILLIN, SEPTRA, STATINS, TAVIST-D AND ZOCOR.   SOCIAL HISTORY: The patient lives with her daughter. She is from [place]. She has a 20-to-30-pack-year history of smoking but does not smoke at this time. Denies alcohol or illicit drug use.   FAMILY HISTORY: The patient has strong family history of coronary artery disease, as both her mother and father died from myocardial infarction.   REVIEW OF SYSTEMS: CONSTITUTIONAL: Denies fevers, chills, weight loss.  EYES: Denies diplopia and blurry vision.  HEENT: Denies headaches or hearing loss. Denies epistaxis.  CARDIOVASCULAR: Denies palpitations but has had some chest pain when at home.  RESPIRATORY: Endorses shortness of breath and dyspnea with exertion.  GASTROINTESTINAL: Denies nausea, vomiting, diarrhea.  GENITOURINARY: Denies frequency, urgency, or dysuria.  MUSCULOSKELETAL: Denies joint pain, swelling, or redness.  INTEGUMENTARY: Denies skin rashes or lesions.  NEUROLOGIC: Denies focal numbness or weakness but does have generalized weakness.  PSYCHIATRIC: Denies depression, bipolar disorder.  ENDOCRINE: Has history of known hypothyroidism.  HEMOLYMPHATIC: Denies easy bruisability, bleeding, or swollen lymph nodes.  ALLERGY AND IMMUNOLOGIC: Denies seasonal allergies or history of immunodeficiency.   PHYSICAL EXAMINATION:  VITAL SIGNS: Temperature 97.3, pulse 89, respirations 18, blood pressure 188/70 and pulse oximetry 94% on 3 liters.  GENERAL: A well-developed, well-nourished Caucasian female who appears her stated age, currently in no acute distress.  HEENT: Normocephalic, atraumatic. Extraocular movements are intact. Pupils equal, round and reactive to light. No scleral icterus. Conjunctivae are pink. No epistaxis noted. Gross hearing intact. Oral mucosae are moist.  NECK: Supple without JVD or lymphadenopathy.  LUNGS: Demonstrate mild bilateral wheezing but otherwise  good air entry and  normal respiratory effort.  HEART: S1, S2 regular rate and rhythm. No murmurs, rubs, or gallops appreciated.  ABDOMEN: Soft, nontender, nondistended. Bowel sounds positive. No rebound or guarding. No gross organomegaly appreciated.  EXTREMITIES: No clubbing, cyanosis, or edema.  NEUROLOGIC: Awake, alert and oriented to time, person, and place. Strength is five out of five in both upper and lower extremities.  GENITOURINARY: No suprapubic tenderness is noted at this time.  SKIN: Warm and dry. No rashes noted.  MUSCULOSKELETAL: No joint redness, swelling or tenderness appreciated.  PSYCHIATRIC: Appropriate affect and appears to have good insight into her current illness.   LABORATORY DATA: Sodium 125, potassium 3.8, chloride 87, BUN 10, creatinine 0.48, glucose 123. Blood osmolality is 270. Troponins x3 sets are negative. TSH is 1.49. CBC shows WBC 10.5, hemoglobin 9.4, hematocrit 28, platelets of 349. Urine osmolality is 457.   A 2-D echocardiogram reveals ejection fraction 65% to 70%. There is LVH present. There is a moderately to severely elevated pulmonary artery systolic pressure. There is mild to moderate aortic valve sclerosis without evidence of stenosis.   IMPRESSION: This is an 81 year old Caucasian female with past medical history of hypertension, diabetes mellitus, hyperlipidemia, diastolic heart failure, chronic obstructive pulmonary disease, pulmonary hypertension, peripheral neuropathy, who presented to [facility] with increasing shortness of breath.   PROBLEM LIST:  1.  Acute-on-chronic hyponatremia.  2.  Hypertension.  3.  Diastolic heart failure.  4.  Chronic obstructive pulmonary disease.   PLAN: The patient certainly has had some worsening in her hyponatremia;  however, there does appear to be some chronicity to this issue. More than likely she has underlying SIADH given her underlying COPD. Diuretic therapy could potentially have  worsened the hyponatremia. However, given the diastolic heart failure at this point in time, we would advise against 3% saline.   I agree with Dr. Burnadette PopLinthavong that a trial of conivaptan may help. Therefore, we will start conivaptan 20 mg IV bolus x1 followed by conivaptan 20 mg IV given continuously over 24 hours. We will monitor serum sodium every 4 hours while on the conivaptan drip. A goal sodium within the next 24 hours would be approximately 132;  otherwise, we recommend continued supportive care in regards to her underlying diastolic heart failure as well as her COPD.   I would like to thank Dr. Burnadette PopLinthavong for this kind referral. Further plan as the patient progresses.    ____________________________ Lennox PippinsMunsoor N. Ronell Boldin, MD mnl:np D: 11/05/2013 15:54:52 ET T: 11/05/2013 16:20:46 ET JOB#: [id number]  cc: Lennox PippinsMunsoor N. Mariana Wiederholt, MD, <Dictator> Ria CommentMUNSOOR N Maddix Heinz MD ELECTRONICALLY SIGNED 11/26/2013 12:11

## 2015-02-13 NOTE — H&P (Signed)
PATIENT NAME:  Catherine Holmes, Catherine Holmes MR#:  [id number] DATE OF BIRTH:  Mar 24, 1928  DATE OF ADMISSION:  01/07/2012  REFERRING PHYSICIAN: Dr. Sharma CovertNorman    PRIMARY CARE PHYSICIAN: Alonna BucklerAndrew Lamb, MD   CARDIOLOGIST: Julien Nordmannimothy Gollan, MD   REASON FOR ADMISSION: Shortness of breath, congestive heart failure exacerbation.   HISTORY OF PRESENT ILLNESS: This is an 81 year old white female with past medical history of diabetes, hypertension poorly controlled, TIA, COPD, carotid artery disease, and Sjogren syndrome who presents with shortness of breath. The patient states on 01/03/2012 she had a syncopal episode. EMS saw her and checked her vitals and blood sugar and told her to see her primary care physician. She was actually scheduled to see Dr. Randa LynnLamb and Dr. Mariah MillingGollan tomorrow but she's had worsening shortness of breath and weakness. She said that she has had chest pressure intermittently throughout the day. She can't quantify pain level but says just intermittent pressure. She also said she had a little bit of diarrhea but no nausea or vomiting. No phlegm. She is just tired and short of breath. The patient was found to have CHF symptoms with crack on her physical exam, chest x-ray, and elevated BNP. She was given Lasix, aspirin, hydralazine, and nitroglycerin ointment. We are asked to admit the patient for CHF exacerbation.   PAST MEDICAL HISTORY: 1. Diabetes. 2. Hypertension, poorly controlled. 3. History of anemia.  4. Hyperlipidemia.  5. Gastroesophageal reflux disease.  6. Transient ischemic attack. 7. Chronic obstructive pulmonary disease. 8. Carotid artery disease. 9. Osteoporosis. 10. Mitral valve prolapse. 11. History of Schatzki's ring. 12. Perforated sigmoid diverticulitis, managed nonsurgically. 13. Sjogren syndrome. 14. History of tic douloureux.   PAST SURGICAL HISTORY: None.   MEDICATIONS AT HOME:  1. Tylenol 650 mg q.6 hours p.r.n.  2. Albuterol ipratropium 2.5/0.5 3 mL q.6 hours p.r.n.   3. Aspirin 81 mg daily. 4. Benicar/hydrochlorothiazide 40/25 one tablet daily.  5. Bystolic 20 mg daily. 6. Centrum tablet daily.  7. Dexilant 60 mg daily.  8. Doxazosin 2 mg at bedtime. 9. Hydralazine 100 mg t.i.d.  10. Metformin 500 mg 2 times a day.  11. Spiriva 18 mcg inhaled daily.  12. Tramadol 50 mg every four hours as needed. 13. Zetia 10 mg daily.   DRUG ALLERGIES: Numerous. Codeine, Ceclor, epinephrine, erythromycin, Advair.   FAMILY HISTORY: Mother died at age 79; father died at age 79's, both of MI. Sister died of MI at age 79.   SOCIAL HISTORY: She is divorced with one son and one daughter who are healthy. She does not smoke but used to smoke 1 pack every 3 to 4 weeks, quit 13 years ago. No illicit drug use or alcohol use. She lives with her daughter. Is able to do her activities of daily living. She used to work in a mill as a Psychologist, counsellingsales agent.   REVIEW OF SYSTEMS: CONSTITUTIONAL: No fever but she has fatigue and weakness. EYES: No blurred vision, double vision, pain, redness, inflammation, glaucoma. HEENT: No tinnitus, ear pain, hearing loss, seasonal allergies, epistaxis, discharge. RESPIRATORY: No cough, wheezing, hemoptysis. She has dyspnea but no asthma or painful respirations. CARDIOVASCULAR: She has chest pain. No orthopnea, edema, arrhythmia. She has dyspnea on exertion. No palpitations. She had a syncopal episode. GI: No nausea, vomiting, diarrhea, abdominal pain, hematemesis, melena, gastroesophageal reflux disease. GU: No dysuria, hematuria, renal calculi, increased frequency, incontinence. GYN: No breast mass, tenderness, vaginal discharge. ENDOCRINE: No polyuria, nocturia, thyroid problems, increased sweating cold intolerance. HEME/LYMPH: No anemia, bruising, swollen glands.  INTEGUMENTARY: No acne, rash, change in mole, hair or skin. MUSCULOSKELETAL: No pain in back, shoulder, knee, hip, arthritis, swelling, gout. NEUROLOGIC: No numbness, weakness, dysarthria, epilepsy,  tremor, vertigo, ataxia. PSYCH: No anxiety, insomnia, ADD, bipolar.     PHYSICAL EXAMINATION:   VITAL SIGNS: Temperature 98.6, heart rate 69, respiratory rate 20, blood pressure 226/95 when she came in, 97% on room air. Now her blood pressure is 160/75.   GENERAL: The patient is well developed, well nourished in no apparent distress, alert and oriented x3.   HEENT: Pupils equal, reactive to light and accommodation. Extraocular movements intact. Anicteric sclerae. No difficulty hearing. Oropharynx clear. Moist mucous membranes.  NECK: No JVD. No thyromegaly. No lymphadenopathy. No carotid bruits.   RESPIRATORY: Bibasilar crackles. No use of accessory muscles or increased respiratory effort. Resonant to percussion.   CARDIOVASCULAR: Regular rate and rhythm. Normal S1, S2. No murmurs, gallops, or rubs appreciated. PMI not displaced. Trace lower extremity edema. 2+ dorsalis pedis pulses.   BREASTS: Deferred.   ABDOMEN: Soft, nontender, nondistended. Positive bowel sounds. No umbilical hernia. No organomegaly.   GU: Deferred.   MUSCULOSKELETAL: Strength 5/5. No clubbing, cyanosis, or degenerative joint disease.   SKIN: No rashes, lesions, or induration.   LYMPH: No lymphadenopathy in the cervical or supraclavicular area.   NEUROLOGIC: Cranial nerves II through XII are intact. Follows commands. Strength 5/5. No dysarthria, aphasia, or dysphagia.   PSYCH: Alert and oriented x3 but very anxious.   LABORATORY DATA: Glucose 122. BNP 3008. BUN 8, creatinine 0.63, sodium 134, potassium 3.4, chloride 94, bicarb 31, anion gap 9, white count 8.9, hemoglobin 8.5, hematocrit 26.7, platelets 441, MCV 73. EKG showed normal sinus rhythm. No ST changes. Chest x-ray showed no acute disease of the chest, possible pulmonary vascular congestion on my interpretation.   ASSESSMENT AND PLAN: This is an 81 year old white female with past medical history of chronic obstructive pulmonary disease and  hyperlipidemia who presents with shortness of breath, chest pressure, and syncopal episode.  1. Shortness of breath, most likely CHF exacerbation. Put her on Lasix 40 b.i.d. Will get echo and Cardiology consult. She may have a component of COPD which she does not have any active wheezing at this point and no infiltrate on chest x-ray. Will continue her on her home nebulizers.  2. Accelerated hypertension with chest pain. Will resume her medications of Bystolic and hydralazine and reassess.  Will cycle cardiac enzymes, add aspirin, nitro, and prn morphine.  Will cycle cardiac enzymes. 3. Syncope. Will send off carotid ultrasound, echo, and head CT. Cycle her cardiac enzymes. Put her on aspirin. Check her TSH.  4. Diabetes. Will continue her on sliding scale insulin.  5. Hyperlipidemia. Will continue Zetia.  6. Gastroesophageal reflux disease. This is stable.  7. Generalized weakness. Will consult physical therapy. 8. Hypokalemia. Will replete and check magnesium in the morning.  9. Anemia. This seems to be chronic. No drop in her hemoglobin.  10. DVT prophylaxis. Maintain with Lovenox.   CODE STATUS: FULL CODE.   TOTAL TIME SPENT ON ADMISSION: 55 minutes.   ____________________________ Corie Chiquito Lafayette Dragon, MD aaf:drc D: 01/08/2012 00:06:00 ET T: 01/08/2012 06:50:18 ET JOB#: [id number] cc: Karolee Ohs A. Lafayette Dragon, MD, <Dictator>, Reola Mosher. Randa Lynn, MD, Antonieta Iba, MD  Karolee Ohs Laverda Page MD ELECTRONICALLY SIGNED 01/08/2012 21:26

## 2015-02-13 NOTE — H&P (Signed)
PATIENT NAME:  Catherine Holmes, [name] MR#:  [id number] DATE OF BIRTH:  1927/11/27  DATE OF ADMISSION:  02/26/2012  ADDENDUM:  PHYSICAL EXAMINATION:  VITAL SIGNS: Blood pressure 129/76, pulse 72, respirations 18, 98% on 2 liters.   GENERAL: Currently comfortable in no acute cardiopulmonary distress.   HEENT: Pupils equal and reactive. Extraocular movements are intact.  NECK: Supple. No JVD. No thyromegaly. No carotid bruit.   LUNGS: Bibasilar crackles at the bases without any accessory muscle use. Decreased breath sounds right greater than left.   CARDIOVASCULAR: Regular rate and rhythm. No murmurs, rubs, or gallops.  ABDOMEN: Soft, nontender, nondistended.  EXTREMITIES: Without cyanosis, clubbing, or edema.   NEUROLOGIC: Cranial nerves II through XII grossly intact. Strength intact in bilateral upper and lower extremities.   LYMPHATIC: No axillary, inguinal, or cervical lymphadenopathy.   REVIEW OF SYSTEMS: No fever. She has fatigue and weakness. EYES: No blurry vision, double vision, pain or redness. ENT: No tinnitus, ear pain, hearing loss, or seasonal allergies. RESPIRATORY: Chronic cough for the last 3 to 4 weeks with occasional wheezing. No hemoptysis. Dyspnea which is chronic for her. CARDIOVASCULAR: Left-sided chest pain. No orthopnea. No paroxysmal nocturnal dyspnea, dependent edema. No palpitations. GI: No nausea, vomiting, diarrhea, abdominal pain, hematemesis, or hematochezia. GU: No dysuria, hematuria, increased frequency or incontinence. ENDOCRINE: No polyuria, nocturia, thyroid problems, increased sweating, cold intolerance. HEME: She has anemia but no obvious bruising or swollen glands. SKIN: No acne, rash, change in moles. MUSCULOSKELETAL: No pain in the back or shoulder. She has left-sided hip pain as described in the history of present illness. NEUROLOGIC: No numbness, weakness, dysarthria, or epilepsy.   ____________________________ Richarda OverlieNayana Ines Rebel, MD na:drc D: 02/26/2012  03:05:49 ET T: 02/26/2012 07:25:17 ET JOB#: [id number]  cc: Richarda OverlieNayana Rosalena Mccorry, MD, <Dictator> Richarda OverlieNAYANA Jadrian Bulman MD ELECTRONICALLY SIGNED 04/02/2012 0:12

## 2015-02-13 NOTE — Discharge Summary (Signed)
PATIENT NAME:  Catherine Holmes, Catherine Holmes MR#:  [id number] DATE OF BIRTH:  08-05-1928  DATE OF ADMISSION:  02/26/2012 DATE OF DISCHARGE:  03/03/2012  For a detailed note, please take a look at the history and physical done on admission by Dr. Susie Cassette.   DIAGNOSES AT DISCHARGE:  1. Right upper lobe lung mass pneumonia versus a suspected malignancy.  2. Chronic obstructive pulmonary disease with no acute exacerbation but now on home oxygen. 3. Hypertension. 4. Depression. 5. Diabetes.  6. Gastroesophageal reflux disease.   DIET: The patient is being discharged on an American diabetic Association low fat, low sodium diet.   ACTIVITY: As tolerated. Follow-up with Dr. Freda Munro in the next 1 to 2 weeks. Also follow-up with Dr. Fidela Juneau in the next 1 to 2 weeks.   DISCHARGE MEDICATIONS:  1. Dexlansoprazole  60 mg daily. 2. Benicar hydrochlorothiazide  40/25, 1 tab daily.  3. Spiriva 1 puff daily.  4. Centrum multivitamin daily.  5. Metformin 500 mg b.i.d.  6. Albuterol ipratropium nebulizer at 4 times daily as needed. 7. Doxazosin 2 mg daily.  8. Zetia 10 mg daily.  9. Tylenol as needed.  10. Demerol 50 mg q.4 hours as needed.  11. Catapres 0.2 mg t.i.d.  12. Celexa 10 mg daily. 13. Bystolic 10 mg 1 tab b.i.d.  14. Augmentin 875 mg p.o. b.i.d. x9 days.   Consultants during the hospital Dr. Freda Munro pulmonary.   PERTINENT STUDIES DONE DURING THE HOSPITAL COURSE: CT scan of the chest, abdomen and pelvis done  on 05/07 showing emphysematous lung disease. New mass in the right upper lobe inferiorly which could represent developing malignant disease. Follow-up was recommended, patchy density in the lingula suggestive of pneumonia. Granulomatous changes in the right upper lobe. No mediastinal or hilar lymphadenopathy. Large left hiatal hernia, fat filled ventral hernia near the umbilicus.   Chest x-ray done on 05/07 showing left side pneumonia superimposed on cardiomegaly and chronic obstructive  pulmonary disease.   HOSPITAL COURSE:  This is an 81 year old female with medical problems as mentioned above presented to the hospital on 02/26/2012 secondary to shortness of breath and left-sided chest pain.  1. Right upper lobe lung mass versus suspected malignancy. The patient presented to the hospital with shortness of breath and pleuritic chest pain ongoing for the past few weeks. A chest x-ray was suggestive of a left-sided pneumonia but a CT scan also showed a right upper lobe mass. The patient was empirically started on broad-spectrum antibiotics with meropenem and vancomycin as she has multiple allergies. A pulmonary consult was obtained. The patient was seen by Dr. Freda Munro. The patient does have a history of tobacco abuse. She does have a high risk for having malignancy, but after his review of the CT scan, he thought this  likely needs to be treated first to see if she has any improvement in a  CT scan before pursuing with bronchoscopic evaluation for possible right upper lobe lung mass. She was maintained on IV meropenem and vancomycin for about six days during the hospital course: Switched to p.o. Augmentin and has tolerated that well. Her blood cultures have remained negative and she has remained afebrile. Clinically she is significantly improved since admission and therefore is being discharged on p.o. antibiotics with close follow-up with pulmonary as an outpatient.  2. Chronic obstructive pulmonary disease. The patient has baseline underlying chronic obstructive pulmonary disease. She did not have any acute exacerbation while she was in the hospital. She was maintained  on  Spiriva, some p.r.n. nebulizer treatments. She will resume her Spiriva and Dulera along with p.r.n. nebulizers for her chronic obstructive pulmonary disease: She was ambulated on room air and did qualify for home oxygen which was arranged for her upon discharge.  3. Hypertension: The patient remained hemodynamically  stable. She will resume her home medications including Benicar HCTZ, Catapres and Bystolic.  4. Depression: The patient was maintained on Celexa and she will resume that upon discharge.  5. Diabetes: The patient was maintained on sliding scale insulin coverage in the hospital. Her metformin was held as she had a contrast guided CT.  Although she will resume her metformin upon discharge.  6. Gastroesophageal reflux disease. The patient was maintained on her Protonix, but she will resume her dexlansoprazole upon discharge.   CODE STATUS: THE PATIENT IS A FULL CODE.   She is being discharged home on oxygen and p.o. antibiotics. She likely will need a repeat CT scan of her chest and follow up right upper lobe lung mass done by pulmonary in an outpatient next 2 to 4 weeks.    TIME SPENT: 40 minutes     ____________________________ Rolly PancakeVivek J. Cherlynn KaiserSainani, MD vjs:ljs D: 03/03/2012 15:15:29 ET T: 03/04/2012 13:45:09 ET JOB#: [id number]  cc: Rolly PancakeVivek J. Cherlynn KaiserSainani, MD, <Dictator> Yevonne PaxSaadat A. Khan, MD Reola MosherAndrew S. Randa LynnLamb, MD Houston SirenVIVEK J SAINANI MD ELECTRONICALLY SIGNED 03/04/2012 16:44

## 2015-02-13 NOTE — H&P (Signed)
PATIENT NAME:  Catherine Holmes, Catherine Holmes MR#:  [id number] DATE OF BIRTH:  1927/12/28  DATE OF ADMISSION:  02/26/2012  PRIMARY CARE PHYSICIAN: Alonna BucklerAndrew Lamb, MD   PRIMARY CARDIOLOGIST: Julien Nordmannimothy Gollan, MD   CHIEF COMPLAINT: Left-sided chest pain.  SUBJECTIVE: This is an 81 year old female with a history of diabetes, peripheral vascular disease, 60% carotid artery disease, status post bilateral iliac stenting, moderate renal stenosis, dyslipidemia, COPD not on home oxygen who presents with left-sided chest pain that started at around 9 p.m. after the patient went to bed. The patient tried to take sublingual nitro, however, her chest pain was not relieved by sublingual nitro. The patient stated that the chest pain was mostly located in her left lateral chest without radiation, not particularly related to activity, and was present at rest. The patient also states that she's had a chronic cough worse over the last two to three weeks. The patient denies any fever, chills, or rigors. The patient also has chronic dyspnea, however, her dyspnea has been worse over the course of the last one week. The patient is allergic to multiple medications and was noted to have an allergic reaction to Lasix, namely itching, during her last admission and her Lasix was discontinued prior to discharge. According to the daughter, the patient was recently started on another diuretic, the name of it she cannot recall at this time. The patient denies any dizziness or lightheadedness.   The patient has had a recent stress test in November 2012 that did not show any significant ischemia and an EF of 55%. She was evaluated by Cardiology during a recent admission in March 2013 for shortness of breath, diastolic heart failure and it was thought that the patient's syncopal episode was noncardiogenic and no further cardiac workup was recommended. The patient is also anemic and she denies any ongoing history of hematemesis, hematochezia, or melena. Her  hemoglobin is chronically low with a baseline of 9.5. The patient also states that about a week ago she saw her orthopedic physician for left hip pain and received a cortisone injection in her trochanteric bursa for possible trochanteric bursitis. The cortisone injection exacerbated her pain and did not provide her any symptomatic relief. She subsequently went on to have an MRI of the left hip that did not show any fluid collection or fracture and the patient has been now referred to Pain Clinic for her left hip pain.    PAST MEDICAL HISTORY: 1. Diabetes. 2. Hypertension. 3. History of anemia. 4. Dyslipidemia. 5. Gastroesophageal reflux disease. 6. Transient ischemic attack. 7. Chronic obstructive pulmonary disease. 8. Carotid artery disease. 9. Osteoporosis. 10. Mitral valve prolapse. 11. History of Schatzki's ring.  12. Chronic diastolic heart failure to exacerbation. 13. History of sigmoid diverticulitis. 14. Sjogren's syndrome.   PAST SURGICAL HISTORY: History of stent placement to her iliac artery.  HOME MEDICATIONS: 1. Dexilant 60 mg p.o. daily. 2. Benicar/HCTZ 40/25 p.o. daily. 3. Spiriva 18 mcg daily. 4. Centrum Silver one tablet daily. 5. Metformin 500 mg p.o. twice daily. 6. DuoNebs q.6 hours p.r.n. 7. Doxazosin 2 mg p.o. daily. 8. Zetia 10 mg 10 mg p.o. daily. 9. Tylenol 650 p.o. q.6 p.r.n. 10. Aspirin 81 daily. 11. Tramadol 50 mg p.o. q.6 p.r.n. 12. Bystolic 10 mg p.o. daily.  ALLERGIES: Multiple allergies to Advair, Ceclor, codeine, Darvon, Doxycycline, epinephrine, erythromycin, Forteo, glipizide, Lasix, Levaquin, lisinopril, penicillin, Septra, Singlet, statins, Zocor.  FAMILY HISTORY: Mother died at the age of 79. Father died at the age of 79. Both had  MI. Sister died of an MI at the age of 95.   SOCIAL HISTORY: She is divorced with one son and one daughter who are healthy. She does not smoke but used to smoke one pack every three to four weeks and quit 13 years  ago. No illicit drug use. Currently lives with her daughter who takes good care of her.   LABORATORY, DIAGNOSTIC, AND RADIOLOGICAL DATA: WBC 22.8, hemoglobin 8.0, hematocrit 26.6, platelet count 460, glucose 143, BUN 11, creatinine 0.62, sodium 130, potassium 3.8, chloride 91, calcium 8.5, anion gap 10. Troponin less than 0.02. BNP 2383.   ASSESSMENT AND PLAN: 1. Left-sided chest pain, pleuritic in nature, nonradiating, fairly atypical for anginal pain. However, the patient's chest x-ray does look suspicious for an infiltrate and, therefore, the patient could have healthcare associated pneumonia. Given her white count of 22,000 and chronic cough for the last three to four weeks, the patient will be started on meropenem and vancomycin. The choice of antibiotics is based on her multiple allergies. Because of her recent hospitalization, we will also do a CT angio of the chest to make sure she does not have any underlying pulmonary embolism. This will also rule out any underlying empyema or pleural effusion that the patient might have.  2. Chronic diastolic heart failure with an elevated BNP of 2383. The patient will be tried on torsemide. She had a reaction to Lasix during her last admission and, therefore, this was discontinued. The patient does need to be on a diuretic long-term. We will also continue her Bystolic.  3. Hypertension. We will hold the Benicar/HCTZ currently because of the fact that the patient's blood pressure is borderline low. This may be resumed prior to discharge. 4. Anemia of chronic disease. The patient's hemoglobin is at her baseline. She does not have any ongoing bleeding and does not need any transfusion at this time.  5. Chronic left internal carotid artery stenosis. This appears to be stable at this time. 6. Diabetes. Hold metformin. Continue the patient on sliding scale insulin. 7. Gastroesophageal reflux disease. Start Protonix. 8. Chronic obstructive pulmonary disease without  exacerbation. Despite the fact that the patient could have pneumonia, continue DuoNeb, Spiriva, and oxygen per protocol. 9. Lovenox for DVT prophylaxis.  CODE STATUS: FULL CODE.   ____________________________ Richarda Overlie, MD na:drc D: 02/26/2012 03:02:44 ET T: 02/26/2012 06:59:35 ET JOB#: [id number] cc: Richarda Overlie, MD, <Dictator>, Reola Mosher. Randa Lynn, MD, Antonieta Iba, MD Richarda Overlie MD ELECTRONICALLY SIGNED 04/02/2012 0:12

## 2015-02-13 NOTE — Consult Note (Signed)
General Aspect 81 year old woman with a history of DM,  peripheral vascular disease, 60% carotid arterial disease as well as moderate arterial disease of the lower extremities, status post bilateral iliac stenting, moderate renal stenosis, hyperlipidemia, severe  hypertension with history of episodic shortness of breath and chest squeezing relieved with albuterol/nebs, previous admission to [facility] for general malaise, severe hypertension, shortness of breath, chest pain, stress test (lexiscan) 11/12 showing no significant ischemia and normal EF >55%, previous bronchospasm with chest tightness,  nebulizers at home, hemoglobin A1c  in the high 7s in the past, chronic significant left hip discomfort. She presents with malaise, SOB, recent syncope, edema, concern for acute diastolic CHF.  She reports syncope 1 to 2 days ago of uncertain etiology. She had no warning. Malaise and weakness since then. Difficulty walking and SOB. Edema worse recently   She had lasix in the ER and feels better already. EMR indicated -700 so far. Recent BP medication changes: clonidine patch was stopped secondary to skin irritation. She is now back on clonidine pill TID. Celexa was also started, 10 mg daily and she feels this has helped her BP very much. She has been unable to tolerate her iron pill secondary to GI upset. She has chronic anemia. She is not able to tolerate statins. She does handle zetia without significant side effects. We have previously encouraged red yeast rice.    Present Illness . carotid arterial ultrasound March 2012 shows 40-59% right internal carotid arterial disease, 60-79% left internal carotid arterial disease,  Dr. Lucky Cowboy follow her PAD.  FAMILY HISTORY: Mother died at age 84; father died at age 55???s, both of MI. Sister died of MI at age 68.   SOCIAL HISTORY:  divorced with one son and one daughter who are healthy. She does not smoke but used to smoke 1 pack every 3 to 4 weeks, quit 13 years ago.  No illicit drug use or alcohol use. She lives with her daughter. Is able to do her activities of daily living. 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   Physical Exam:   GEN WD, WN, NAD    HEENT pink conjunctivae    NECK supple  No masses    RESP normal resp effort  wheezing  rhonchi    CARD Regular rate and rhythm  Murmur    Murmur Systolic    ABD denies tenderness  soft    LYMPH negative neck    EXTR negative edema    SKIN normal to palpation    NEURO cranial nerves intact, motor/sensory function intact    PSYCH alert, A+O to time, place, person, good insight   Review of Systems:   Subjective/Chief Complaint SOB, recent syncope, edema (resolved)    General: Fatigue  Weakness    Skin: No Complaints    ENT: No Complaints    Eyes: No Complaints    Neck: No Complaints    Respiratory: Short of breath    Cardiovascular: Dyspnea    Gastrointestinal: No Complaints    Genitourinary: No Complaints    Vascular: No Complaints    Musculoskeletal: No Complaints    Neurologic: No Complaints    Hematologic: No Complaints    Endocrine: No Complaints    Psychiatric: No Complaints    Review of Systems: All other systems were reviewed and found to be negative    Medications/Allergies Reviewed Medications/Allergies reviewed     Mitral Valve Prolapse:    Emphysema:    TIA -  Transient Ischemic Attack:    Arthrolgias:    CHF:    sjogens syndrome:    Hyperlipidemia:    Tic [place]:    Carotid Artery Dz:    CAD:    osteoporosis:    COPD:    gerd:    Diabetes Mellitus, Type II (NIDD):    htn:        Admit Diagnosis:   ACUTE CHF SYNCOPE: 08-Jan-2012, Active, ACUTE CHF SYNCOPE      Admit Reason:   Acute CHF: (428.0) Active, ICD9, Congestive heart failure, unspecified   Syncope: (780.2) Active, ICD9, Syncope and collapse  Home Medications: Medication Instructions Status  dexlansoprazole 60 mg delayed release capsule 1 cap(s)  orally once a day  Active  Benicar HCT 40 mg-25 mg oral tablet 1 tab(s) orally once a day Active  Spiriva 18 mcg inhalation capsule 1 each inhaled once a day Active  Centrum Silver oral tablet 1 tab(s) orally once a day Active  metformin 500 mg oral tablet 1 tab(s) orally 2 times a day Active  hydrALAZINE 100 mg oral tablet 1 tab(s) orally 3 times a day Active  Bystolic 20 mg oral tablet 1 tab(s) orally once a day (at bedtime) Active  albuterol-ipratropium 2.5 mg-0.5 mg/3 mL inhalation solution milliliter(s) inhaled 4 times a day, As Needed Active  doxazosin 2 mg oral tablet 1 tab(s) orally once a day (at bedtime) Active  Zetia 10 mg oral tablet 1 tab(s) orally once a day (at bedtime) Active  acetaminophen 650  orally every 6 hours, As Needed Active  aspirin 81 mg oral tablet 1 tab(s) orally once a day Active  tramadol 50 mg oral tablet 1 tab(s) orally every 4 hours, As Needed Active     Routine Hem:  18-Mar-13 19:25    WBC (CBC) 8.9   RBC (CBC) 3.64   Hemoglobin (CBC) 8.5   Hematocrit (CBC) 26.7   Platelet Count (CBC) 441   MCV 73   MCH 23.2   MCHC 31.7   RDW 18.5  Routine Chem:  18-Mar-13 19:25    Glucose, Serum 122   BUN 8   Creatinine (comp) 0.63   Sodium, Serum 134   Potassium, Serum 3.4   Chloride, Serum 94   CO2, Serum 31   Calcium (Total), Serum 8.6   Anion Gap 9   Osmolality (calc) 268   eGFR (African American) >60   eGFR (Non-African American) >60  Cardiac:  18-Mar-13 19:25    Troponin I < 0.02  Routine Chem:  18-Mar-13 19:25    B-Type Natriuretic Peptide [facility]) 3008  Cardiac:  18-Mar-13 19:25    CK, Total 95   CPK-MB, Serum 2.4  Routine Hem:  19-Mar-13 03:29    WBC (CBC) 10.6   RBC (CBC) 3.91   Hemoglobin (CBC) 9.0   Hematocrit (CBC) 28.7   Platelet Count (CBC) 468   MCV 73   MCH 23.1   MCHC 31.4   RDW 18.8  Routine Chem:  19-Mar-13 03:29    Glucose, Serum 166   BUN 8   Creatinine (comp) 0.74   Sodium, Serum 136   Potassium, Serum 3.2    Chloride, Serum 93   CO2, Serum 33   Calcium (Total), Serum 9.1   Anion Gap 10   Osmolality (calc) 274   eGFR (African American) >60   eGFR (Non-African American) >60  Cardiac:  19-Mar-13 03:29    Troponin I 0.02   CK, Total 97   CPK-MB, Serum 2.4  Routine Hem:  19-Mar-13 03:29    Neutrophil % 74.7   Lymphocyte % 15.2   Monocyte % 8.8   Eosinophil % 1.2   Basophil % 0.1   Neutrophil # 7.9   Lymphocyte # 1.6   Monocyte # 0.9   Eosinophil # 0.1   Basophil # 0.0  Routine Chem:  19-Mar-13 03:29    Magnesium, Serum 0.9  Thyroid:  19-Mar-13 03:29    Thyroid Stimulating Hormone 1.25  Cardiac:  19-Mar-13 11:30    Troponin I < 0.02   CK, Total 112   CPK-MB, Serum 2.7   EKG:   Interpretation EKG shows normal sinus rhythm rate 70 beats per minute, no significant ST or T wave changes   Radiology Results: XRay:    18-Mar-13 20:44, Chest PA and Lateral   Chest PA and Lateral    REASON FOR EXAM:    sob  COMMENTS:       PROCEDURE: DXR - DXR CHEST PA (OR AP) AND LATERAL  - Jan 07 2012  8:44PM     RESULT: Comparison: None    Findings:     PA and lateral chest radiographs are provided.  There is no focal   parenchymal opacity, pleural effusion, or pneumothorax. The heart size is   enlarged..  The osseous structures are unremarkable.    IMPRESSION:     No acute disease of the chest.    Dictation Site: 3          Verified By: HETAL P. PATEL, M.D., MD    Singlet: Wheezing  Lisinopril: Itching, Hives  Epinephrine: Itching, Hives  Advair Diskus: Itching, Hives  Doxycycline: Hives  Glipizide: Hives  Levaquin: Muscles aches, N/V/Diarrhea  Ceclor: Other  Erythromycin: Unknown  Septra: Unknown  Tavist-D: Unknown  Penicillin: Unknown  Darvon: Unknown  Codeine: Unknown  Zocor: Unknown  Forteo: Unknown  Statins: Muscles aches  Vital Signs/Nurse's Notes: **Vital Signs.:   19-Mar-13 07:30   Temperature Temperature (F) 97.7   Celsius 36.5   Temperature  Source oral   Pulse Pulse 76   Pulse source per cardiac monitor   Respirations Respirations 18   Systolic BP Systolic BP 180   Diastolic BP (mmHg) Diastolic BP (mmHg) 82   Mean BP 114   BP Source manual   Pulse Ox % Pulse Ox % 96   Pulse Ox Activity Level  At rest   Oxygen Delivery 2L     Impression 81-year-old woman with a history of DM,  severe PAD,  hyperlipidemia, severe  hypertension with history of episodic shortness of breath and chest squeezing relieved with albuterol/nebs, previous admission to [facility] for general malaise, severe hypertension, chest pain, stress test (lexiscan) 11/12 showing no significant ischemia and normal EF >55%, previous bronchospasm with chest tightness, nebulizers at home, presents with malaise, SOB, recent syncope, edema, concern for acute diastolic CHF.  A/P: 1) Syncope: Etiology uncertain, concerning for arrhythmia (currently being monitored on telemetry) Potassium and magnesium very low which may have contributed. Unable to exclude anemia as contributor No known CAD, though certainly high risk. Negative stress test 08/2011. Cardiac enz negative so far.   2) SOB Suspect secondary to acute diastolic CHF, exacerbated by anemia and COPD Some improvement so far with diuresis on lasix IV --Would continue gentle diuresis --echo pending to evaluate LV function and RVSP to guide therapy  3) Anemia Unable to tolerate iron pill, GI upset. May need alternate form or iron infusion.  4) COPD Followed by Dr. Mcquid   as an outpt on nebs at home and inhalers. Appears stable.  5) PAD carotid ultrasound pending Followed by Dr. Lucky Cowboy as an outpt   Electronic Signatures: Ida Rogue (MD)  (Signed 19-Mar-13 12:38)  Authored: General Aspect/Present Illness, History and Physical Exam, Review of System, Past Medical History, Health Issues, Home Medications, Labs, EKG , Radiology, Allergies, Vital Signs/Nurse's Notes, Impression/Plan   Last Updated: 19-Mar-13  12:38 by Ida Rogue (MD)

## 2015-02-13 NOTE — Discharge Summary (Signed)
PATIENT NAME:  Catherine Holmes, Catherine Holmes MR#:  [id number] DATE OF BIRTH:  12-27-1927  DATE OF ADMISSION:  01/07/2012 DATE OF DISCHARGE:  01/10/2012  ADMITTING PHYSICIAN: Larena Glassman, MD  DISCHARGING PHYSICIAN: Enid Baas, MD  PRIMARY CARE PHYSICIAN: Alonna Buckler, MD  CONSULTANTS: Julien Nordmann, MD - Cardiology  DISCHARGE DIAGNOSES:  1. Acute diastolic congestive heart failure with ejection fraction of 55%.  2. Hypertension. 3. Hyperlipidemia.  4. Anemia of chronic disease and also iron deficiency.  5. Syncope likely vasovagal.  6. Chronic left internal carotid artery with moderate stenosis.  7. Diabetes mellitus.  8. Gastroesophageal reflux disease.  9. Chronic obstructive pulmonary disease. 10. History of Schatzki's ring. 11. Sjogren's syndrome.  DISCHARGE HOME MEDICATIONS.  1. Dexilant 60 mg p.o. daily.  2. Benicar/HCTZ 40 mg/25 mg p.o. daily. 3. Spiriva 18 mcg inhalation capsule daily.  4. Centrum Silver 1 tablet p.o. daily.  5. Metformin 500 mg p.o. twice a day. 6. DuoNebs with albuterol - ipratropium 2.5 mg-0.5 mg/3 mL, 3 mL solution inhaled every 6 hours as needed.  7. Doxazosin 2 mg p.o. daily.  8. Zetia 10 mg p.o. daily.  9. Tylenol 650 mg p.o. every 6 hours p.r.n.  10. Aspirin 81 mg p.o. daily.  11. Tramadol 50 mg p.o. every six hours p.r.n.  12. Bystolic 10 mg p.o. daily.   DISCHARGE HOME OXYGEN: None.   DISCHARGE DIET: Low sodium, ADA diet.   DISCHARGE ACTIVITY: As tolerated.  FOLLOWUP INSTRUCTIONS:  1. PCP followup in 1 to 2 weeks.  2. Cardiology followup with Dr. Julien Nordmann in 1 to 2 weeks.  3. Hematology followup in 3 to 4 weeks for anemia.   LABS/IMAGING STUDIES: WBC 10.6, hemoglobin 9.0, hematocrit 28.7, platelet count 468.   Sodium 136, potassium 3.2, chloride 93, bicarbonate 33, BUN 8, creatinine 0.74, glucose 166, calcium 9.1 TSH 1.25. Cardiac enzymes remained negative.   Urinalysis negative for any infection.  BNP elevated at 3008.    Chest x-ray on admission showed clear lung fields. No acute osseous abnormality. Dilated cardiac silhouette.   CT of the head showed age-related changes, otherwise no acute intracranial process present.   2-D echocardiogram showed normal left ventricular systolic function, ejection fraction greater than 55%, mild concentric left ventricular hypertrophy with impaired LV relaxation is present. Right ventricular pressures are elevated.   Carotid Doppler bilaterally showed hemodynamically significant stenosis in proximal left internal carotid artery.  BRIEF HOSPITAL COURSE: Catherine Holmes is an 81 year old elderly Caucasian female with past medical history significant for diabetes, poorly controlled hypertension, hyperlipidemia, osteoporosis, and coronary artery disease who presented to the hospital complaining of dyspnea and also a syncopal episode.   1. Acute respiratory failure secondary to congestive heart failure exacerbation. She does have diastolic dysfunction on echo and was diuresed with Lasix on presentation. She was extensively diuresed for the first couple of days and then Lasix was stopped. Her Lasix is being stopped at the time of discharge and she will be reevaluated by Dr. Mariah Milling in the office in the next 1 to 2 weeks. She was advised to check her blood pressure at home and take her blood pressure medications as directed. 2. Accelerated hypertension. Medications were adjusted and she is being discharged on Bystolic, Benicar, and HCTZ. She did have episodes of hypotension from overtreatment of her blood pressure while in the hospital.  3. Anemia of chronic disease and iron deficiency. [facility]. Her hemoglobin was stable while  in the hospital. 4. Syncope. It was likely vasovagal. She did have a CT of the head which was negative. Echocardiogram did not show any abnormalities other than LDH and Doppler showed left-sided moderate degree  internal carotid artery stenosis. Dr. Wyn Quakerew has seen the patient while in the hospital and he knows the patient very well and he has been following her for moderate left ICA stenosis and it has been stable, according to him, according to her outpatient studies. He does not think it could have caused her syncope and he will follow her up as an outpatient.  5. Her course has been otherwise uneventful in the hospital. She did work with physical therapy who recommended home health. The patient has not required any home oxygen as on ambulation her saturations were up to 95%. The patient and daughter refused home health and wanted to follow up with outpatient PT services.    DISCHARGE CONDITION: Stable.               DISCHARGE DISPOSITION: Home with outpatient PT services.  TIME SPENT ON DISCHARGE: 45 minutes.  ____________________________ Enid Baasadhika Tynesha Free, MD rk:slb D: 01/10/2012 16:12:19 ET T: 01/11/2012 09:49:16 ET JOB#: [id number]  cc: Enid Baasadhika Duriel Deery, MD, <Dictator> Antonieta Ibaimothy J. Gollan, MD Reola MosherAndrew S. Randa LynnLamb, MD Enid BaasADHIKA Sanskriti Greenlaw MD ELECTRONICALLY SIGNED 01/13/2012 0:07

## 2015-02-13 NOTE — Consult Note (Signed)
PATIENT NAME:  Catherine Holmes, [name] MR#:  [id number] DATE OF BIRTH:  1928-09-14  DATE OF CONSULTATION:  02/28/2012  REFERRING PHYSICIAN:   CONSULTING PHYSICIAN:  Yevonne PaxSaadat A. Kendallyn Lippold, MD  REASON FOR CONSULTATION: Chest pain as well as a possible lung mass found on CT scan.   HISTORY OF PRESENT ILLNESS: This is an 81 year old female who has past medical history significant for chronic obstructive pulmonary disease. She presents to the hospital with left-sided chest pain. She was evaluated with a CT scan of the chest and she had been noted to have an upper lobe mass versus pneumonia or infiltrate. Patient also does have a history of cardiac disease and she had tried to take some sublingual nitroglycerin but it was not really improved. She is followed by Dr. Mariah MillingGollan in his clinic and she has history of ejection fraction of 55%. Recent stress test was done and it was fine. The patient does have chronic anemia also. She says that she does get short of breath pretty easily. She does not use oxygen at home. She had no hemoptysis noted. She has not had any recent syncopal episode but has had syncope in the past.   PAST MEDICAL HISTORY:  1. Hypertension. 2. Diabetes. 3. Congestive heart failure.  4. Schatzki's ring.  5. Mitral valve prolapse.  6. Osteoporosis.  7. Coronary artery disease.  8. Chronic obstructive pulmonary disease.  9. Transient ischemic attacks.   PAST SURGICAL HISTORY: Significant for sent placement for peripheral vascular disease.    ALLERGIES: Allergies are multiple and are listed on the electronic medical record.   FAMILY HISTORY: Positive for coronary disease.   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 She has no other occupational exposure. As far as her smoking history is concerned, she quit about 20 years ago and she was what she terms as a "closet smoker".  REVIEW OF SYSTEMS: A complete 12 point review of systems was done  and it was unremarkable other than what is noted above in the history of present illness.   PHYSICAL EXAMINATION:  VITAL SIGNS: At the time that she was seen temperature 98, pulse 95, respiratory rate 22, blood pressure 144/71, saturations 97% on 2 liters nasal cannula.   NECK: The neck appeared to be supple. There was no JVD. No adenopathy. No thyromegaly.   CHEST: Coarse breath sounds. No rhonchi, no rales. Expansion appeared to be equal.   CARDIOVASCULAR: S1, S2 was normal. Regular rhythm. No gallop or rub.   NEUROLOGIC: Appeared to be awake and alert, moving all four extremities.   ABDOMEN: Soft and nontender.   SKIN: Without any acute rashes.   MUSCULOSKELETAL: Without any active synovitis.   LABORATORY, DIAGNOSTIC AND RADIOLOGICAL DATA: On admission white count 22.8, has come down to 11.1. As far as her chemistries are concerned showed BNP on admission 2383. Patient's sodium today 132, serum bicarbonate 35, potassium 4.1, chloride 94. The patient's cardiac enzymes have been negative. CT report shows chronic obstructive pulmonary disease changes bilaterally and a mass in the right upper lobe inferiorly which is concerning for development of malignancy. Also had some granulomatous changes in right upper lobe. There was no mediastinal or hilar adenopathy. The patient also has significant large hiatal hernia and also had some inguinal hernias.   IMPRESSION: Acute pneumonia with possible underlying mass of unclear etiology.   PLAN: At this time she is on antibiotics which should be continued. Her initial white  count which was elevated has come down. I would suggest probably repeating a CT scan in about a week or so and I be happy to do this in the outpatient setting and the patient has indicated a desire to see me in the office after she is discharged. Would continue with other therapy as you are, continue with oxygen therapy and she probably will need to go home on oxygen. Will make further  recommendations as necessary.   ____________________________ Yevonne Pax, MD sak:cms D: 02/28/2012 13:24:48 ET T: 02/28/2012 15:49:46 ET JOB#: [id number]  cc: Yevonne Pax, MD, <Dictator> Yevonne Pax MD ELECTRONICALLY SIGNED 03/07/2012 11:56

## 2015-02-13 NOTE — Consult Note (Signed)
Brief Consult Note: Diagnosis: possible lung mass.   Patient was seen by consultant.   Consult note dictated.   Comments: will review all scans. Patient may need to be sent home and f/u scan as an outpatient she would like to follow up in office with me.  Electronic Signatures: Yevonne PaxKhan, Lonzy Mato A (MD)  (Signed 09-May-13 13:19)  Authored: Brief Consult Note   Last Updated: 09-May-13 13:19 by Yevonne PaxKhan, Dawud Mays A (MD)

## 2015-02-25 ENCOUNTER — Encounter: Payer: Self-pay | Admitting: Cardiovascular Disease

## 2015-02-25 ENCOUNTER — Ambulatory Visit: Payer: Medicare Other | Admitting: Cardiovascular Disease

## 2015-02-25 ENCOUNTER — Ambulatory Visit (INDEPENDENT_AMBULATORY_CARE_PROVIDER_SITE_OTHER): Payer: Medicare Other | Admitting: Cardiovascular Disease

## 2015-02-25 VITALS — BP 108/80 | HR 70 | Ht 60.0 in | Wt 173.5 lb

## 2015-02-25 DIAGNOSIS — I10 Essential (primary) hypertension: Secondary | ICD-10-CM

## 2015-02-25 DIAGNOSIS — I5032 Chronic diastolic (congestive) heart failure: Secondary | ICD-10-CM | POA: Diagnosis not present

## 2015-02-25 DIAGNOSIS — E785 Hyperlipidemia, unspecified: Secondary | ICD-10-CM

## 2015-02-25 DIAGNOSIS — R0602 Shortness of breath: Secondary | ICD-10-CM | POA: Diagnosis not present

## 2015-02-25 DIAGNOSIS — I739 Peripheral vascular disease, unspecified: Secondary | ICD-10-CM

## 2015-02-25 DIAGNOSIS — E1165 Type 2 diabetes mellitus with hyperglycemia: Secondary | ICD-10-CM

## 2015-02-25 DIAGNOSIS — J449 Chronic obstructive pulmonary disease, unspecified: Secondary | ICD-10-CM | POA: Diagnosis not present

## 2015-02-25 DIAGNOSIS — IMO0002 Reserved for concepts with insufficient information to code with codable children: Secondary | ICD-10-CM

## 2015-02-25 NOTE — Assessment & Plan Note (Signed)
Long discussion today with her concerning her chronic diastolic CHF. Recommended she take Bumex at least twice per week, if not 3 times per week plus extra Bumex for ankle swelling

## 2015-02-25 NOTE — Assessment & Plan Note (Signed)
Followed by pulmonary, on several inhalers. She reports having worsening of her shortness of breath, also deconditioned. We will place an order for pulmonary rehabilitation.

## 2015-02-25 NOTE — Assessment & Plan Note (Signed)
Weight is up 23 pounds. We have recommended she start pulmonary rehabilitation for weight loss Perhaps to rehabilitation she could meet with a dietitian

## 2015-02-25 NOTE — Assessment & Plan Note (Signed)
She denies any claudication type pain. Unable to tolerate statins, "makes her hair fall out"

## 2015-02-25 NOTE — Assessment & Plan Note (Signed)
Blood pressure is well controlled on today's visit. No changes made to the medications. 

## 2015-02-25 NOTE — Patient Instructions (Addendum)
You are doing well. No medication changes were made.  Try bumex 2 times a week Take extra for leg edema  We will send an order for pulmonary rehab for shortness of breath and COPD  Please call us if you have new issues that need to be addressed before your next appt.  Your physician wants you to follow-up in: 3 months.  You will receive a reminder letter in the mail two months in advance. If you don't receive a letter, please call our office to schedule the follow-up appointment.

## 2015-02-25 NOTE — Progress Notes (Signed)
Patient ID: Catherine Holmes, female    DOB: 1927/12/23, 81 y.o.   MRN: [id number]  HPI Comments: 81 year-old woman with a history of DM,  peripheral vascular disease, 60 to 70% carotid arterial disease, moderate arterial disease of the lower extremities, status post bilateral iliac stenting, moderate renal stenosis, hyperlipidemia, history of severe  hypertension with labile blood pressures, history of episodic shortness of breath and chest squeezing relieved with albuterol/nebs who presents for routine followup. She has a sulfa allergy to loop diuretics. History of falls.   left carotid arterial stenosis estimated at 60-79% disease. She has had several admissions for diastolic CHF, typically from dietary indiscretion and periodic noncompliance with diuretics She was admitted on 11/02/2013 , discharged on 11/16/2013  admitted on 01/24/2014, discharge from 01/27/2014 for acute respiratory failure secondary to COPD History of anemia followed by hematology She presents today for follow-up of her chronic diastolic CHF  In follow-up today, she reports that she has been taking her Bumex as needed, not on a regular basis She does have swelling around her ankles, up to her mid shin She denies any significant shortness of breath. She reports having a fall out of a chair, hurt her hip. She was sitting on her patio, had to call family On average she goes to bed at 1 AM, gets up 2 times per night, wakes up at 8 AM. Feels tired in the daytime. Watches TV late into the night. She is requesting pulmonary rehabilitation for chronic shortness of breath, deconditioning, recent weight gain. Inactivity has made her weaker.  Continues to eat out periodically, trying to eat more healthy  EKG on today's visit shows atrial fibrillation with ventricular rate 71 bpm  Other past medical history  Followed by Dr. Cherylann Ratel for her kidney issues  She stopped her amiodarone out of concern for possible pulmonary complications.  Denies having any palpitations She does report having rare episodes of near syncope wall in a sitting position. Comes across her like a wave it resolves relatively quickly  She was in the hospital 05/05/2014 with hypertensive urgency, hyponatremia. Blood pressure is 201/98 and she was put back on her outpatient regiment with improvement of her symptoms. She suffered a fractured hip on 05/07/2014 and we were consulted for preop evaluation. Found to have elevated at her pressures at 65. She was discharged on 05/17/2014 EKGs from her hospital course showed she had atrial fibrillation on 05/09/2014  Previous Echocardiogram in the hospital showed normal ejection fraction, moderate to severe, hypertension, moderately dilated left atrium  Has a history of large hiatal hernia She is not able to tolerate statins. She does handle zetia without significant side effects. previously encouraged to take red yeast rice.   Previous weight October 2014 was 148 pounds.  weight at the end of December 2014 was 160 pounds, she had increasing shortness of breath, had not been taking Bumex    in the hospital at [facility] in 2012 for general malaise, severe hypertension, shortness of breath, chest pain. She had a stress test, lexiscan that showed no significant ischemia. Significant shortness of breath with lexiscan.   hospital admission on 01/08/2012 for shortness of breath and syncope. She was found to have significant anemia. Treated with Lasix for diastolic CHF.  Underlying severe left carotid arterial disease estimated at 70%.   Prior followup with Dr. Welton Flakes in pulmonary with numerous CT scans, MRIs, PFTs, sleep study. She was told that she had hypoxemia overnight and was started on nighttime oxygen.  hospitalization 05/27/2013 for acute on chronic diastolic CHF. She was treated with IV Bumex twice a day with improvement of her symptoms. Getting factor was her anemia with hematocrit of 25.  Previous iron  infusion. In the second week of September 2014 she fell outside a cracker barrel and hit her head. She has been slow to recover. She still has a knot on her head.  Echocardiogram in the hospital August 2014 shows normal ejection fraction, severely elevated right ventricular systolic pressures, mild to moderate TR  Outpatient Encounter Prescriptions as of 02/25/2015  Medication Sig  . albuterol (PROAIR HFA) 108 (90 BASE) MCG/ACT inhaler Inhale 2 puffs into the lungs every 6 (six) hours as needed.  Marland Kitchen. albuterol (PROVENTIL) (2.5 MG/3ML) 0.083% nebulizer solution Take 2.5 mg by nebulization every 6 (six) hours as needed.    Marland Kitchen. arformoterol (BROVANA) 15 MCG/2ML NEBU Take 2 mLs (15 mcg total) by nebulization 2 (two) times daily.  Marland Kitchen. aspirin 81 MG tablet Take 81 mg by mouth daily.    . beta carotene w/minerals (OCUVITE) tablet Take 1 tablet by mouth 2 (two) times daily.  . bumetanide (BUMEX) 1 MG tablet Take 1 tablet (1 mg total) by mouth 2 (two) times daily as needed. Take 1 tab BID PRN  . calcium carbonate (OS-CAL) 600 MG TABS tablet Take 600 mg by mouth daily with breakfast.  . Cholecalciferol (VITAMIN D-3 PO) Take 1,000 Units by mouth.  . citalopram (CELEXA) 20 MG tablet Take 20 mg by mouth daily.  Marland Kitchen. dexlansoprazole (DEXILANT) 60 MG capsule Take 60 mg by mouth daily as needed.  . diphenhydramine-acetaminophen (TYLENOL PM) 25-500 MG TABS Take 1 tablet by mouth at bedtime as needed.  . doxazosin (CARDURA) 4 MG tablet Take 4 mg by mouth at bedtime.   Marland Kitchen. ezetimibe (ZETIA) 10 MG tablet Take 1 tablet (10 mg total) by mouth daily.  Marland Kitchen. gabapentin (NEURONTIN) 100 MG capsule Take 100 mg by mouth 2 (two) times daily.   . hydrALAZINE (APRESOLINE) 50 MG tablet Take 1 tablet (50 mg total) by mouth 2 (two) times daily.  Marland Kitchen. HYDROcodone-acetaminophen (NORCO/VICODIN) 5-325 MG per tablet Take 1 tablet by mouth as needed.   . metFORMIN (GLUCOPHAGE) 500 MG tablet Take 500 mg by mouth daily as needed.   .  mometasone-formoterol (DULERA) 100-5 MCG/ACT AERO Inhale 2 puffs into the lungs.  . Multiple Vitamin (MULTIVITAMIN) tablet Take 1 tablet by mouth daily.    . nebivolol (BYSTOLIC) 5 MG tablet Take 5 mg by mouth daily.  . nitroGLYCERIN (NITROSTAT) 0.4 MG SL tablet Place 1 tablet (0.4 mg total) under the tongue every 5 (five) minutes as needed for chest pain.  . pantoprazole (PROTONIX) 40 MG tablet Take 40 mg by mouth daily.  . potassium chloride (KLOR-CON 10) 10 MEQ tablet TAKE 1/2 TABLET BY MOUTH TWICE A DAY AS NEEDED WITH BUMEX  . spironolactone (ALDACTONE) 25 MG tablet Take 25 mg by mouth daily.  Marland Kitchen. tiotropium (SPIRIVA) 18 MCG inhalation capsule Place 1 capsule (18 mcg total) into inhaler and inhale daily.  Marland Kitchen. triamcinolone (KENALOG) 0.025 % cream Apply 1 application topically as needed.    No facility-administered encounter medications on file as of 02/25/2015.   Social hx History   Social History  . Marital Status: Single    Spouse Name: N/A  . Number of Children: 2  . Years of Education: N/A   Occupational History  . Retired     former IT sales professionalsales associate   Social History Main Topics  .  Smoking status: Former Smoker -- 0.50 packs/day for 50 years    Types: Cigarettes    Quit date: 10/22/2000  . Smokeless tobacco: Never Used  . Alcohol Use: No  . Drug Use: No  . Sexual Activity: Not on file   Other Topics Concern  . Not on file   Social History Narrative    Review of Systems  Constitutional: Positive for fatigue.  Respiratory: Positive for shortness of breath.   Cardiovascular: Positive for leg swelling.  Gastrointestinal: Negative.   Musculoskeletal: Positive for myalgias, arthralgias and gait problem.  Neurological: Positive for weakness.  Hematological: Negative.   Psychiatric/Behavioral: Negative.   All other systems reviewed and are negative.   BP 108/80 mmHg  Pulse 70  Ht 5' (1.524 m)  Wt 173 lb 8 oz (78.699 kg)  BMI 33.88 kg/m2  Physical Exam   Constitutional: She is oriented to person, place, and time. She appears well-developed and well-nourished.  HENT:  Head: Normocephalic.  Nose: Nose normal.  Mouth/Throat: Oropharynx is clear and moist.  Eyes: Conjunctivae are normal. Pupils are equal, round, and reactive to light.  Neck: Normal range of motion. Neck supple. No JVD present.  Cardiovascular: Normal rate, regular rhythm, S1 normal, S2 normal, normal heart sounds and intact distal pulses.  Exam reveals no gallop and no friction rub.   No murmur heard. Trace pitting edema to the midshin  Pulmonary/Chest: Effort normal and breath sounds normal. No respiratory distress. She has no wheezes. She has no rales. She exhibits no tenderness.  Abdominal: Soft. Bowel sounds are normal. She exhibits no distension. There is no tenderness.  Musculoskeletal: Normal range of motion. She exhibits edema. She exhibits no tenderness.  Lymphadenopathy:    She has no cervical adenopathy.  Neurological: She is alert and oriented to person, place, and time. Coordination normal.  Skin: Skin is warm and dry. No rash noted. No erythema.  Psychiatric: She has a normal mood and affect. Her behavior is normal. Judgment and thought content normal.    Assessment and Plan  Nursing note and vitals reviewed.

## 2015-02-25 NOTE — Assessment & Plan Note (Signed)
She does not want a statin, tolerating zetia. Goal LDL less than 70 Samples provided

## 2015-03-01 ENCOUNTER — Telehealth: Payer: Self-pay | Admitting: *Deleted

## 2015-03-01 NOTE — Telephone Encounter (Signed)
Referral was sent on 02/25/15.  Pulm rehab will call pt w/ appt.

## 2015-03-01 NOTE — Telephone Encounter (Signed)
Gavin PoundDeborah called regarding pulmonary rehab. Can you please call her with the appt date and time.

## 2015-03-23 ENCOUNTER — Telehealth: Payer: Self-pay | Admitting: *Deleted

## 2015-03-23 NOTE — Telephone Encounter (Signed)
Pt daughter Debbi asking if they can get some Zetia samples.  Please call when ready.

## 2015-03-23 NOTE — Telephone Encounter (Signed)
Placed Zetia samples at front desk for pick up.

## 2015-03-29 ENCOUNTER — Ambulatory Visit
Admission: RE | Admit: 2015-03-29 | Discharge: 2015-03-29 | Disposition: A | Discharge status: Home / Self Care | Payer: Medicare Other | Source: Ambulatory Visit | Attending: Internal Medicine | Admitting: Internal Medicine

## 2015-03-29 ENCOUNTER — Other Ambulatory Visit: Payer: Self-pay | Admitting: Internal Medicine

## 2015-03-29 DIAGNOSIS — J449 Chronic obstructive pulmonary disease, unspecified: Secondary | ICD-10-CM | POA: Diagnosis present

## 2015-03-29 DIAGNOSIS — Z79899 Other long term (current) drug therapy: Secondary | ICD-10-CM

## 2015-03-29 DIAGNOSIS — D638 Anemia in other chronic diseases classified elsewhere: Secondary | ICD-10-CM | POA: Diagnosis present

## 2015-03-29 DIAGNOSIS — Z9114 Patient's other noncompliance with medication regimen: Secondary | ICD-10-CM | POA: Diagnosis present

## 2015-03-29 DIAGNOSIS — M810 Age-related osteoporosis without current pathological fracture: Secondary | ICD-10-CM | POA: Diagnosis present

## 2015-03-29 DIAGNOSIS — E119 Type 2 diabetes mellitus without complications: Secondary | ICD-10-CM | POA: Diagnosis present

## 2015-03-29 DIAGNOSIS — E785 Hyperlipidemia, unspecified: Secondary | ICD-10-CM | POA: Diagnosis present

## 2015-03-29 DIAGNOSIS — Z7982 Long term (current) use of aspirin: Secondary | ICD-10-CM

## 2015-03-29 DIAGNOSIS — F329 Major depressive disorder, single episode, unspecified: Secondary | ICD-10-CM | POA: Diagnosis present

## 2015-03-29 DIAGNOSIS — I10 Essential (primary) hypertension: Secondary | ICD-10-CM | POA: Diagnosis present

## 2015-03-29 DIAGNOSIS — Z886 Allergy status to analgesic agent status: Secondary | ICD-10-CM

## 2015-03-29 DIAGNOSIS — R0981 Nasal congestion: Principal | ICD-10-CM

## 2015-03-29 DIAGNOSIS — Z8673 Personal history of transient ischemic attack (TIA), and cerebral infarction without residual deficits: Secondary | ICD-10-CM

## 2015-03-29 DIAGNOSIS — Z8249 Family history of ischemic heart disease and other diseases of the circulatory system: Secondary | ICD-10-CM

## 2015-03-29 DIAGNOSIS — Z9109 Other allergy status, other than to drugs and biological substances: Secondary | ICD-10-CM

## 2015-03-29 DIAGNOSIS — R05 Cough: Secondary | ICD-10-CM

## 2015-03-29 DIAGNOSIS — I480 Paroxysmal atrial fibrillation: Secondary | ICD-10-CM | POA: Diagnosis present

## 2015-03-29 DIAGNOSIS — Z7951 Long term (current) use of inhaled steroids: Secondary | ICD-10-CM

## 2015-03-29 DIAGNOSIS — E871 Hypo-osmolality and hyponatremia: Secondary | ICD-10-CM | POA: Diagnosis present

## 2015-03-29 DIAGNOSIS — Z9981 Dependence on supplemental oxygen: Secondary | ICD-10-CM

## 2015-03-29 DIAGNOSIS — Z880 Allergy status to penicillin: Secondary | ICD-10-CM

## 2015-03-29 DIAGNOSIS — Z9111 Patient's noncompliance with dietary regimen: Secondary | ICD-10-CM | POA: Diagnosis present

## 2015-03-29 DIAGNOSIS — Z90710 Acquired absence of both cervix and uterus: Secondary | ICD-10-CM

## 2015-03-29 DIAGNOSIS — Z87891 Personal history of nicotine dependence: Secondary | ICD-10-CM

## 2015-03-29 DIAGNOSIS — I5033 Acute on chronic diastolic (congestive) heart failure: Principal | ICD-10-CM | POA: Diagnosis present

## 2015-03-29 DIAGNOSIS — Z9181 History of falling: Secondary | ICD-10-CM

## 2015-03-29 DIAGNOSIS — K219 Gastro-esophageal reflux disease without esophagitis: Secondary | ICD-10-CM | POA: Diagnosis present

## 2015-03-29 DIAGNOSIS — G629 Polyneuropathy, unspecified: Secondary | ICD-10-CM | POA: Diagnosis present

## 2015-03-29 DIAGNOSIS — Z9049 Acquired absence of other specified parts of digestive tract: Secondary | ICD-10-CM | POA: Diagnosis present

## 2015-03-29 DIAGNOSIS — Z888 Allergy status to other drugs, medicaments and biological substances status: Secondary | ICD-10-CM

## 2015-03-29 DIAGNOSIS — Z8701 Personal history of pneumonia (recurrent): Secondary | ICD-10-CM

## 2015-03-30 ENCOUNTER — Telehealth: Payer: Self-pay | Admitting: *Deleted

## 2015-03-30 NOTE — Telephone Encounter (Signed)
Pt called wanting to let Dr. Mariah MillingGollan know that Dr. Park BreedKahn asked her to hold her Bystolic this morning.  Pt stated she goes back tomorrow to see Dr. Park BreedKahn and to get results from Xrays she had yesterday.  Pt has not additional questions at this time.

## 2015-03-30 NOTE — Telephone Encounter (Signed)
Pt daughter is calling stating pt went to PCP and pt BP was a bit high and Dr Milta DeitersKhan's office states to pt to not take the Bystolic in the morning  She is going to go back to them tomorrow for he is running some tests, but is calling us for today she has not taken it and her BP is 125/61 Please advise. They want to ask us as well.

## 2015-03-31 ENCOUNTER — Inpatient Hospital Stay: Payer: Medicare Other

## 2015-03-31 ENCOUNTER — Encounter: Payer: Self-pay | Admitting: Nurse Practitioner

## 2015-03-31 ENCOUNTER — Inpatient Hospital Stay
Admission: AD | Admit: 2015-03-31 | Discharge: 2015-04-04 | DRG: 292 | Disposition: A | Discharge status: Home / Self Care | Payer: Medicare Other | Source: Ambulatory Visit | Attending: Internal Medicine | Admitting: Internal Medicine

## 2015-03-31 DIAGNOSIS — F329 Major depressive disorder, single episode, unspecified: Secondary | ICD-10-CM | POA: Diagnosis present

## 2015-03-31 DIAGNOSIS — Z79899 Other long term (current) drug therapy: Secondary | ICD-10-CM | POA: Diagnosis not present

## 2015-03-31 DIAGNOSIS — I10 Essential (primary) hypertension: Secondary | ICD-10-CM | POA: Diagnosis present

## 2015-03-31 DIAGNOSIS — Z7951 Long term (current) use of inhaled steroids: Secondary | ICD-10-CM | POA: Diagnosis not present

## 2015-03-31 DIAGNOSIS — Z8249 Family history of ischemic heart disease and other diseases of the circulatory system: Secondary | ICD-10-CM | POA: Diagnosis not present

## 2015-03-31 DIAGNOSIS — Z9114 Patient's other noncompliance with medication regimen: Secondary | ICD-10-CM | POA: Diagnosis present

## 2015-03-31 DIAGNOSIS — D638 Anemia in other chronic diseases classified elsewhere: Secondary | ICD-10-CM | POA: Diagnosis present

## 2015-03-31 DIAGNOSIS — Z9181 History of falling: Secondary | ICD-10-CM | POA: Diagnosis not present

## 2015-03-31 DIAGNOSIS — Z87891 Personal history of nicotine dependence: Secondary | ICD-10-CM | POA: Diagnosis not present

## 2015-03-31 DIAGNOSIS — G629 Polyneuropathy, unspecified: Secondary | ICD-10-CM | POA: Diagnosis present

## 2015-03-31 DIAGNOSIS — Z7982 Long term (current) use of aspirin: Secondary | ICD-10-CM | POA: Diagnosis not present

## 2015-03-31 DIAGNOSIS — Z90710 Acquired absence of both cervix and uterus: Secondary | ICD-10-CM | POA: Diagnosis not present

## 2015-03-31 DIAGNOSIS — Z9049 Acquired absence of other specified parts of digestive tract: Secondary | ICD-10-CM | POA: Diagnosis present

## 2015-03-31 DIAGNOSIS — D649 Anemia, unspecified: Secondary | ICD-10-CM | POA: Diagnosis not present

## 2015-03-31 DIAGNOSIS — M810 Age-related osteoporosis without current pathological fracture: Secondary | ICD-10-CM | POA: Diagnosis present

## 2015-03-31 DIAGNOSIS — Z8673 Personal history of transient ischemic attack (TIA), and cerebral infarction without residual deficits: Secondary | ICD-10-CM | POA: Diagnosis present

## 2015-03-31 DIAGNOSIS — J449 Chronic obstructive pulmonary disease, unspecified: Secondary | ICD-10-CM | POA: Diagnosis present

## 2015-03-31 DIAGNOSIS — I5033 Acute on chronic diastolic (congestive) heart failure: Secondary | ICD-10-CM | POA: Diagnosis present

## 2015-03-31 DIAGNOSIS — Z880 Allergy status to penicillin: Secondary | ICD-10-CM | POA: Diagnosis not present

## 2015-03-31 DIAGNOSIS — Z9111 Patient's noncompliance with dietary regimen: Secondary | ICD-10-CM | POA: Diagnosis present

## 2015-03-31 DIAGNOSIS — E871 Hypo-osmolality and hyponatremia: Secondary | ICD-10-CM | POA: Diagnosis present

## 2015-03-31 DIAGNOSIS — E785 Hyperlipidemia, unspecified: Secondary | ICD-10-CM | POA: Diagnosis present

## 2015-03-31 DIAGNOSIS — Z888 Allergy status to other drugs, medicaments and biological substances status: Secondary | ICD-10-CM | POA: Diagnosis not present

## 2015-03-31 DIAGNOSIS — Z9109 Other allergy status, other than to drugs and biological substances: Secondary | ICD-10-CM | POA: Diagnosis not present

## 2015-03-31 DIAGNOSIS — Z8701 Personal history of pneumonia (recurrent): Secondary | ICD-10-CM | POA: Diagnosis not present

## 2015-03-31 DIAGNOSIS — I480 Paroxysmal atrial fibrillation: Secondary | ICD-10-CM | POA: Diagnosis present

## 2015-03-31 DIAGNOSIS — E119 Type 2 diabetes mellitus without complications: Secondary | ICD-10-CM

## 2015-03-31 DIAGNOSIS — Z9981 Dependence on supplemental oxygen: Secondary | ICD-10-CM | POA: Diagnosis not present

## 2015-03-31 DIAGNOSIS — K219 Gastro-esophageal reflux disease without esophagitis: Secondary | ICD-10-CM | POA: Diagnosis present

## 2015-03-31 DIAGNOSIS — I509 Heart failure, unspecified: Secondary | ICD-10-CM

## 2015-03-31 DIAGNOSIS — Z886 Allergy status to analgesic agent status: Secondary | ICD-10-CM | POA: Diagnosis not present

## 2015-03-31 HISTORY — DX: Chest pain, unspecified: R07.9

## 2015-03-31 HISTORY — DX: Paroxysmal atrial fibrillation: I48.0

## 2015-03-31 HISTORY — DX: Patient's noncompliance with other medical treatment and regimen: Z91.19

## 2015-03-31 HISTORY — DX: Type 2 diabetes mellitus without complications: E11.9

## 2015-03-31 HISTORY — DX: Patient's noncompliance with other medical treatment and regimen due to unspecified reason: Z91.199

## 2015-03-31 HISTORY — DX: Essential (primary) hypertension: I10

## 2015-03-31 LAB — CBC
HEMATOCRIT: 26.7 % — AB (ref 35.0–47.0)
Hemoglobin: 8.5 g/dL — ABNORMAL LOW (ref 12.0–16.0)
MCH: 26 pg (ref 26.0–34.0)
MCHC: 31.6 g/dL — ABNORMAL LOW (ref 32.0–36.0)
MCV: 82.1 fL (ref 80.0–100.0)
PLATELETS: 329 10*3/uL (ref 150–440)
RBC: 3.26 MIL/uL — AB (ref 3.80–5.20)
RDW: 16.8 % — AB (ref 11.5–14.5)
WBC: 7.1 10*3/uL (ref 3.6–11.0)

## 2015-03-31 LAB — BASIC METABOLIC PANEL
ANION GAP: 10 (ref 5–15)
BUN: 14 mg/dL (ref 6–20)
CALCIUM: 9.5 mg/dL (ref 8.9–10.3)
CHLORIDE: 88 mmol/L — AB (ref 101–111)
CO2: 33 mmol/L — AB (ref 22–32)
Creatinine, Ser: 0.91 mg/dL (ref 0.44–1.00)
GFR calc Af Amer: >60 mL/min (ref >60)
GFR calc non Af Amer: 56 mL/min — ABNORMAL LOW (ref >60)
Glucose, Bld: 139 mg/dL — ABNORMAL HIGH (ref 65–99)
POTASSIUM: 4.8 mmol/L (ref 3.5–5.1)
SODIUM: 131 mmol/L — AB (ref 135–145)

## 2015-03-31 LAB — CKMB (ARMC ONLY): CK, MB: 3.5 ng/mL (ref 0.5–5.0)

## 2015-03-31 LAB — CK: CK TOTAL: 77 U/L (ref 38–234)

## 2015-03-31 LAB — TROPONIN I

## 2015-03-31 MED ORDER — OCUVITE PO TABS
1.0000 | ORAL_TABLET | Freq: Two times a day (BID) | ORAL | Status: DC
  Administered 2015-03-31 – 2015-04-04 (×8): 1 via ORAL
  Filled 2015-03-31 (×10): qty 1

## 2015-03-31 MED ORDER — TRIAMCINOLONE ACETONIDE 0.025 % EX OINT
TOPICAL_OINTMENT | Freq: Every day | CUTANEOUS | Status: DC | PRN
  Filled 2015-03-31: qty 15

## 2015-03-31 MED ORDER — BUMETANIDE 1 MG PO TABS
1.0000 mg | ORAL_TABLET | Freq: Two times a day (BID) | ORAL | Status: DC
  Administered 2015-03-31 – 2015-04-04 (×7): 1 mg via ORAL
  Filled 2015-03-31 (×9): qty 1

## 2015-03-31 MED ORDER — ALBUTEROL SULFATE (2.5 MG/3ML) 0.083% IN NEBU: 2.5000 mg | INHALATION_SOLUTION | Freq: Four times a day (QID) | RESPIRATORY_TRACT | Status: DC | PRN

## 2015-03-31 MED ORDER — ERYTHROMYCIN 5 MG/GM OP OINT
1.0000 "application " | TOPICAL_OINTMENT | Freq: Every day | OPHTHALMIC | Status: DC
  Administered 2015-03-31 – 2015-04-03 (×4): 1 via OPHTHALMIC
  Filled 2015-03-31: qty 3.5

## 2015-03-31 MED ORDER — ADULT MULTIVITAMIN W/MINERALS CH
1.0000 | ORAL_TABLET | Freq: Every day | ORAL | Status: DC
  Administered 2015-03-31 – 2015-04-04 (×5): 1 via ORAL
  Filled 2015-03-31 (×6): qty 1

## 2015-03-31 MED ORDER — TIOTROPIUM BROMIDE MONOHYDRATE 2.5 MCG/ACT IN AERS: 1.0000 | INHALATION_SPRAY | Freq: Every day | RESPIRATORY_TRACT | Status: DC

## 2015-03-31 MED ORDER — ASPIRIN EC 81 MG PO TBEC
81.0000 mg | DELAYED_RELEASE_TABLET | Freq: Every day | ORAL | Status: DC
  Administered 2015-03-31 – 2015-04-04 (×5): 81 mg via ORAL
  Filled 2015-03-31 (×5): qty 1

## 2015-03-31 MED ORDER — ACETAMINOPHEN 650 MG RE SUPP: 650.0000 mg | Freq: Four times a day (QID) | RECTAL | Status: DC | PRN

## 2015-03-31 MED ORDER — SPIRONOLACTONE 25 MG PO TABS
25.0000 mg | ORAL_TABLET | Freq: Every day | ORAL | Status: DC
  Administered 2015-03-31 – 2015-04-04 (×4): 25 mg via ORAL
  Filled 2015-03-31 (×5): qty 1

## 2015-03-31 MED ORDER — CITALOPRAM HYDROBROMIDE 20 MG PO TABS
20.0000 mg | ORAL_TABLET | Freq: Every day | ORAL | Status: DC
  Administered 2015-03-31 – 2015-04-04 (×5): 20 mg via ORAL
  Filled 2015-03-31 (×5): qty 1

## 2015-03-31 MED ORDER — VITAMIN D 1000 UNITS PO TABS
1000.0000 [IU] | ORAL_TABLET | Freq: Every day | ORAL | Status: DC
  Administered 2015-03-31 – 2015-04-04 (×5): 1000 [IU] via ORAL
  Filled 2015-03-31 (×6): qty 1

## 2015-03-31 MED ORDER — GABAPENTIN 100 MG PO CAPS
100.0000 mg | ORAL_CAPSULE | Freq: Two times a day (BID) | ORAL | Status: DC
  Administered 2015-03-31 – 2015-04-04 (×8): 100 mg via ORAL
  Filled 2015-03-31 (×8): qty 1

## 2015-03-31 MED ORDER — TIOTROPIUM BROMIDE MONOHYDRATE 18 MCG IN CAPS
18.0000 ug | ORAL_CAPSULE | Freq: Every day | RESPIRATORY_TRACT | Status: DC
  Administered 2015-03-31 – 2015-04-04 (×5): 18 ug via RESPIRATORY_TRACT
  Filled 2015-03-31: qty 5

## 2015-03-31 MED ORDER — MOMETASONE FURO-FORMOTEROL FUM 100-5 MCG/ACT IN AERO
2.0000 | INHALATION_SPRAY | Freq: Two times a day (BID) | RESPIRATORY_TRACT | Status: DC
Start: 2015-03-31 — End: 2015-04-04
  Administered 2015-03-31 – 2015-04-04 (×8): 2 via RESPIRATORY_TRACT
  Filled 2015-03-31: qty 8.8

## 2015-03-31 MED ORDER — HEPARIN SODIUM (PORCINE) 5000 UNIT/ML IJ SOLN
5000.0000 [IU] | Freq: Three times a day (TID) | INTRAMUSCULAR | Status: DC
  Administered 2015-03-31 – 2015-04-04 (×12): 5000 [IU] via SUBCUTANEOUS
  Filled 2015-03-31 (×12): qty 1

## 2015-03-31 MED ORDER — CALCIUM CARBONATE ANTACID 500 MG PO CHEW
500.0000 mg | CHEWABLE_TABLET | Freq: Every day | ORAL | Status: DC
  Administered 2015-04-01 – 2015-04-04 (×4): 500 mg via ORAL
  Filled 2015-03-31 (×8): qty 1

## 2015-03-31 MED ORDER — NITROGLYCERIN 0.4 MG SL SUBL: 0.4000 mg | SUBLINGUAL_TABLET | SUBLINGUAL | Status: DC | PRN

## 2015-03-31 MED ORDER — TRIAMCINOLONE ACETONIDE 0.025 % EX CREA
1.0000 "application " | TOPICAL_CREAM | Freq: Every day | CUTANEOUS | Status: DC | PRN
  Filled 2015-03-31: qty 15

## 2015-03-31 MED ORDER — POLYMYXIN B-TRIMETHOPRIM 10000-0.1 UNIT/ML-% OP SOLN
1.0000 [drp] | Freq: Two times a day (BID) | OPHTHALMIC | Status: DC
  Administered 2015-04-01 – 2015-04-04 (×4): 1 [drp] via OPHTHALMIC
  Filled 2015-03-31: qty 10

## 2015-03-31 MED ORDER — POTASSIUM CHLORIDE CRYS ER 10 MEQ PO TBCR
10.0000 meq | EXTENDED_RELEASE_TABLET | Freq: Every day | ORAL | Status: DC
  Administered 2015-03-31 – 2015-04-04 (×5): 10 meq via ORAL
  Filled 2015-03-31 (×5): qty 1

## 2015-03-31 MED ORDER — ONDANSETRON HCL 4 MG PO TABS
4.0000 mg | ORAL_TABLET | Freq: Four times a day (QID) | ORAL | Status: DC | PRN
  Administered 2015-04-02 – 2015-04-03 (×3): 4 mg via ORAL
  Filled 2015-03-31 (×3): qty 1

## 2015-03-31 MED ORDER — HYDROCODONE-ACETAMINOPHEN 5-325 MG PO TABS: 1.0000 | ORAL_TABLET | ORAL | Status: DC | PRN

## 2015-03-31 MED ORDER — ACETAMINOPHEN 325 MG PO TABS
650.0000 mg | ORAL_TABLET | Freq: Four times a day (QID) | ORAL | Status: DC | PRN
  Administered 2015-03-31: 650 mg via ORAL
  Filled 2015-03-31: qty 2

## 2015-03-31 MED ORDER — DOXAZOSIN MESYLATE 4 MG PO TABS
4.0000 mg | ORAL_TABLET | Freq: Every day | ORAL | Status: DC
  Administered 2015-03-31: 4 mg via ORAL
  Filled 2015-03-31 (×2): qty 1

## 2015-03-31 MED ORDER — ONDANSETRON HCL 4 MG/2ML IJ SOLN: 4.0000 mg | Freq: Four times a day (QID) | INTRAMUSCULAR | Status: DC | PRN

## 2015-03-31 MED ORDER — NEBIVOLOL HCL 5 MG PO TABS
5.0000 mg | ORAL_TABLET | Freq: Every day | ORAL | Status: DC
  Administered 2015-04-01 – 2015-04-04 (×2): 5 mg via ORAL
  Filled 2015-03-31 (×5): qty 1

## 2015-03-31 MED ORDER — FLUTICASONE PROPIONATE 50 MCG/ACT NA SUSP
1.0000 | Freq: Every day | NASAL | Status: DC
  Administered 2015-03-31 – 2015-04-03 (×4): 1 via NASAL
  Filled 2015-03-31: qty 16

## 2015-03-31 MED ORDER — HYDRALAZINE HCL 50 MG PO TABS
50.0000 mg | ORAL_TABLET | Freq: Two times a day (BID) | ORAL | Status: DC
  Administered 2015-03-31 – 2015-04-01 (×3): 50 mg via ORAL
  Filled 2015-03-31 (×4): qty 1

## 2015-03-31 NOTE — Consult Note (Signed)
CARDIOLOGY CONSULT NOTE   Patient ID: Catherine Holmes MRN: [id number], DOB/AGE: 79/16/1929   Admit date: 03/31/2015 Date of Consult: 03/31/2015   Primary Physician: Marisue Ivan, MD Primary Cardiologist: Concha Se, MD   Pt. Profile  81 y/o female with a h/o diastolic CHF, whom we've been asked to eval 2/2 dyspnea and volume overload.  Problem List  Past Medical History  Diagnosis Date  . Essential hypertension   . Diabetes mellitus type II, controlled   . COPD (chronic obstructive pulmonary disease)   . Osteoporosis   . Carotid arterial disease     a. 2012 Carotid dopplers: 40-59% R ICA, 60-79% L ICA  . Tic douloureux   . Arthralgia   . Sjoegren syndrome   . TIA (transient ischemic attack)   . Emphysema   . MVP (mitral valve prolapse)   . Chronic diastolic CHF (congestive heart failure)     a. 08/2011 Echo: EF 65-70%, mild LVH, mod dil LA, mildly dil RA, mild MR, mild-mod AoV Sclerosis w/o stenosis, mod-sev elev PA pressures, mild TR.  Marland Kitchen Acid reflux   . Diverticulitis   . Anemia   . History of pneumonia   . Syncope and collapse   . Anemia   . Hip fracture, left   . Hyperlipidemia   . Hiatal hernia   . Chest pain     a. 08/2011 MV: EF 74%, no ischemia.  . Noncompliance     a. h/o not taking bumex as Rx.  Marland Kitchen PAF (paroxysmal atrial fibrillation)     a. CHA2DS2VASc = 8 ->No OAC 2/2 falls.    Past Surgical History  Procedure Laterality Date  . Cholecystectomy  1965  . Abdominal hysterectomy  1964  . Cataract extraction Bilateral   . Hip fracture surgery  2014    left hip     Allergies  Allergies  Allergen Reactions  . Atorvastatin   . Codeine   . Doxycycline   . Epinephrine Hives and Itching  . Erythromycin   . Fluticasone-Salmeterol   . Lasix [Furosemide] Itching  . Levofloxacin   . Penicillins   . Propoxyphene Hcl   . Rosuvastatin   . Simvastatin   . Singlet [Chlorphen-Pe-Acetaminophen] Other (See Comments)    Reaction: Wheezing  .  Sulfamethoxazole-Trimethoprim   . Teriparatide   . Cefaclor Rash and Other (See Comments)    Reaction: Delirious    . Glipizide Hives  . Lisinopril Hives and Itching    HPI   47 y /o female with the above complex problem list.  She has a h/o PAF (no anticoag 2/2 falls) and chronic diast chf with nl LV fxn by echo in 2012.  She was last seen by Dr. Mariah Milling in May and was felt to be volume overloaded.  She was not taking her bumex as Rx and he recommended that she take it at least 2-3 x / wk.  Since then, she says that she has been taking her bumex in that fashion, along with daily spironolactone.  Over the past 2 wks, she has been having worsening dyspnea and has required supplemental O2 more often.  She saw her pulmonologist and was initially placed on abx for bronchitis, as she was also coughing with yellow sputum.  Her bystolic was held, presumably 2/2 concern for copd/wheezing.  She has continued to have significant lower ext edema and doe, though denies pnd/orthopnea.  Her wt is up to 174 lbs from 167 in May.  Dr. Mariah Milling has previously  advised her to keep her wt below 155 lbs.    Last night after dinner, she had an episode of severe, band-like chest pain, that resolved in about 1 hr, after taking 3 doses of sl ntg.  She saw her pulmonologist in f/u today and he was concerned about chf and thus she was referred to the ED for evaluation.  Currently, she is dyspneic with speech but denies chest pain.  Inpatient Medications  . aspirin EC  81 mg Oral Daily  . beta carotene w/minerals  1 tablet Oral BID  . bumetanide  1 mg Oral BID  . [START ON 04/01/2015] calcium carbonate  500 mg Oral Q breakfast  . cholecalciferol  1,000 Units Oral Daily  . citalopram  20 mg Oral Daily  . doxazosin  4 mg Oral QHS  . erythromycin  1 application Both Eyes QHS  . fluticasone  1 spray Each Nare Daily  . gabapentin  100 mg Oral BID  . heparin  5,000 Units Subcutaneous 3 times per day  . hydrALAZINE  50 mg Oral  BID  . mometasone-formoterol  2 puff Inhalation BID  . multivitamin with minerals  1 tablet Oral Daily  . potassium chloride  10 mEq Oral Daily  . spironolactone  25 mg Oral Daily  . tiotropium  18 mcg Inhalation Daily  . trimethoprim-polymyxin b  1 drop Both Eyes BID    Family History Family History  Problem Relation Age of Onset  . Heart attack Mother   . Heart attack Father   . Heart attack Sister      Social History History   Social History  . Marital Status: Single    Spouse Name: N/A  . Number of Children: 2  . Years of Education: N/A   Occupational History  . Retired     former IT sales professional   Social History Main Topics  . Smoking status: Former Smoker -- 0.50 packs/day for 50 years    Types: Cigarettes    Quit date: 10/22/2000  . Smokeless tobacco: Never Used  . Alcohol Use: No  . Drug Use: No  . Sexual Activity: Not on file   Other Topics Concern  . Not on file   Social History Narrative     Review of Systems  General:  No chills, fever, night sweats or weight changes.  Cardiovascular:  +++ chest pain, +++ dyspnea on exertion, +++ edema, no orthopnea, palpitations, paroxysmal nocturnal dyspnea. Dermatological: No rash, lesions/masses Respiratory: No cough, +++ dyspnea Urologic: No hematuria, dysuria Abdominal:   No nausea, vomiting, diarrhea, bright red blood per rectum, melena, or hematemesis Neurologic:  No visual changes, wkns, changes in mental status. All other systems reviewed and are otherwise negative except as noted above.  Physical Exam  Blood pressure 146/42, pulse 70, temperature 97.9 F (36.6 C), temperature source Oral, resp. rate 18, height 5' (1.524 m), weight 174 lb 11.2 oz (79.243 kg), SpO2 100 %.  General: Pleasant, NAD Psych: Normal affect. Neuro: Alert and oriented X 3. Moves all extremities spontaneously. HEENT: Normal  Neck: Supple without bruits.  Mod elevated JVP. Lungs:  Resp regular and unlabored, markedly  diminished breath sounds bilat with basilar crackles and intermittent exp wheezing.  Scattered rhonchi. Heart: RRR no s3, s4, 2/6 syst murmur loudest @ rusb. Abdomen: Soft, non-tender, non-distended, BS + x 4.  Extremities: No clubbing, cyanosis.  2-4 + soft bilat LE edema. DP/PT/Radials 1+ and equal bilaterally.  Labs   Recent Labs  03/31/15 1608  CKTOTAL 77  CKMB 3.5   Lab Results  Component Value Date   WBC 7.1 03/31/2015   HGB 8.5* 03/31/2015   HCT 26.7* 03/31/2015   MCV 82.1 03/31/2015   PLT 329 03/31/2015    Radiology/Studies  Dg Chest 2 View  03/31/2015   CLINICAL DATA:  Shortness of breath, fatigue and CHF.  EXAM: CHEST - 2 VIEW  COMPARISON:  03/29/2015  FINDINGS: There is evidence of mild interstitial edema. This is more prominent compared to the prior chest x-ray. No pleural effusions are identified. No evidence of focal consolidation, pneumothorax or mass. Stable large hiatal hernia. The heart is mildly enlarged.  IMPRESSION: Development of mild pulmonary interstitial edema.   Electronically Signed   By: Irish Lack M.D.   On: 03/31/2015 15:50   Dg Chest 2 View  03/29/2015   CLINICAL DATA:  Oxygen dependent COPD patient presenting with acute onset of cough, chest congestion and shortness of breath.  EXAM: CHEST  2 VIEW  COMPARISON:  CTA chest 06/08/2014, 05/08/2014. PET-CT 11/13/2014. Chest x-rays 06/08/2014 and earlier.  FINDINGS: Cardiac silhouette moderately enlarged, unchanged. Thoracic aorta atherosclerotic, unchanged. Large hiatal hernia, unchanged. Hilar and mediastinal contours otherwise unremarkable. Emphysematous changes in both lungs, unchanged. Mildly prominent bronchovascular markings diffusely and mild central peribronchial thickening, unchanged. Lungs otherwise clear. No localized airspace consolidation. No pleural effusions. No pneumothorax. Normal pulmonary vascularity. Osseous demineralization with osteoporotic compression fractures of T8 and T12, unchanged.   IMPRESSION: COPD/emphysema. Stable moderate cardiomegaly. No acute cardiopulmonary disease.   Electronically Signed   By: Hulan Saas M.D.   On: 03/29/2015 17:28    ECG  Not done  ASSESSMENT AND PLAN  1. Acute on chronic diastolic CHF:  Pt presents with progressive dyspnea and volume overload that has worsened over the past 2 wks but has been present for over a month.  She had only been taking her bumex 2-3 x/wk despite volume overload/edema.  Agree with bumex bid for now w/ ongoing spironolactone therapy.  She is intolerant to loop diuretics.  Awaiting bmet.  She says appetite has been poor.  Edema is very doughy in nature - check albumin.  BP mildly elevated - follow with diuresis.  HR currently stable, though she has had very brief runs of Afib since admission - asymptomatic.  BB on hold in light of acute resp failure.  She was on bystolic - resume.  2.  Essential HTN: As above, bp mildly elevated. Follow with diuresis.  Resume bystolic.  3.  HL:  Intolerant to statins.  4.  COPD:  On prn O2 @ home, though she's been requiring it around the clock for at least the past week.  Diminished breath sounds on exam.  Inhalers per IM.  5.  DM II:  Per IM.  6.  PAF:  She has had brief runs of afib since admission with rates into the 130's.  She has been asymptomatic.  CHA2DS2VASc = 8, however she is not on OAC, presumably 2/2 h/o falls.  Cont ASA.  Resume BB.  7.  Anemia:  ? Chronicity.  Follow.  Signed, Nicolasa Ducking, NP 03/31/2015, 5:40 PM

## 2015-03-31 NOTE — H&P (Addendum)
[facility] - [place] at [facility]   PATIENT NAME: Catherine Holmes    MR#:  [id number]  DATE OF BIRTH:  [birth year]  DATE OF ADMISSION:  03/31/2015  PRIMARY CARE PHYSICIAN: Marisue Ivan, MD   REQUESTING/REFERRING PHYSICIAN: [facility]  CHIEF COMPLAINT:   Lower extremity edema, shortness of breath.  HISTORY OF PRESENT ILLNESS:  Catherine Holmes  is a 81 y.o. female with a known history of COPD, history of CHF chronic diastolic, GERD, previous TIA, osteoporosis, carotid artery disease, paroxysmal atrial fibrillation, diverticulitis, who presented to the hospital referred from her pulmonologist's office in due to worsening lower extremity edema and shortness of breath. Patient was apparently being treated for a possible acute bronchitis although her symptoms are not improving and she has been developing worsening lower extremity edema and exertional shortness of breath. She was thought to be in mild acute on chronic diastolic CHF and therefore is being admitted for further evaluation.   PAST MEDICAL HISTORY:   Past Medical History  Diagnosis Date  . Essential hypertension   . Diabetes mellitus type II, controlled   . COPD (chronic obstructive pulmonary disease)   . Osteoporosis   . Carotid arterial disease     a. 2012 Carotid dopplers: 40-59% R ICA, 60-79% L ICA  . Tic douloureux   . Arthralgia   . Sjoegren syndrome   . TIA (transient ischemic attack)   . Emphysema   . MVP (mitral valve prolapse)   . Chronic diastolic CHF (congestive heart failure)     a. 08/2011 Echo: EF 65-70%, mild LVH, mod dil LA, mildly dil RA, mild MR, mild-mod AoV Sclerosis w/o stenosis, mod-sev elev PA pressures, mild TR.  Marland Kitchen Acid reflux   . Diverticulitis   . Anemia   . History of pneumonia   . Syncope and collapse   . Anemia   . Hip fracture, left   . Hyperlipidemia   . Hiatal hernia   . Chest pain     a. 08/2011 MV: EF 74%, no ischemia.  . Noncompliance      a. h/o not taking bumex as Rx.    PAST SURGICAL HISTORY:   Past Surgical History  Procedure Laterality Date  . Cholecystectomy  1965  . Abdominal hysterectomy  1964  . Cataract extraction Bilateral   . Hip fracture surgery  2014    left hip    SOCIAL HISTORY:   History  Substance Use Topics  . Smoking status: Former Smoker -- 0.50 packs/day for 50 years    Types: Cigarettes    Quit date: 10/22/2000  . Smokeless tobacco: Never Used  . Alcohol Use: No    FAMILY HISTORY:   Family History  Problem Relation Age of Onset  . Heart attack Mother   . Heart attack Father   . Heart attack Sister     DRUG ALLERGIES:   Allergies  Allergen Reactions  . Atorvastatin   . Codeine   . Doxycycline   . Epinephrine Hives and Itching  . Erythromycin   . Fluticasone-Salmeterol   . Lasix [Furosemide] Itching  . Levofloxacin   . Penicillins   . Propoxyphene Hcl   . Rosuvastatin   . Simvastatin   . Singlet [Chlorphen-Pe-Acetaminophen] Other (See Comments)    Reaction: Wheezing  . Sulfamethoxazole-Trimethoprim   . Teriparatide   . Cefaclor Rash and Other (See Comments)    Reaction: Delirious    . Glipizide Hives  . Lisinopril Hives and Itching  REVIEW OF SYSTEMS:   Review of Systems  Constitutional: Negative for fever and weight loss.       + 5-6 lb weight gain   HENT: Negative for congestion, nosebleeds and tinnitus.   Eyes: Negative for blurred vision, double vision and redness.  Respiratory: Positive for cough and sputum production (yellow/clear). Negative for hemoptysis, shortness of breath and wheezing.   Cardiovascular: Positive for chest pain (vague, atypical), orthopnea (chronic 2 pillow) and leg swelling. Negative for PND.  Gastrointestinal: Negative for nausea, vomiting, abdominal pain, diarrhea and melena.  Genitourinary: Negative for dysuria, urgency and hematuria.  Musculoskeletal: Negative for joint pain and falls.  Skin: Negative for rash.   Neurological: Positive for weakness (generalized). Negative for dizziness, tingling, sensory change, focal weakness, seizures and headaches.  Endo/Heme/Allergies: Negative for polydipsia. Does not bruise/bleed easily.  Psychiatric/Behavioral: Negative for depression and memory loss. The patient is not nervous/anxious.     MEDICATIONS AT HOME:   Prior to Admission medications   Medication Sig Start Date End Date Taking? Authorizing Provider  albuterol (PROVENTIL) (2.5 MG/3ML) 0.083% nebulizer solution Take 2.5 mg by nebulization every 6 (six) hours as needed for wheezing or shortness of breath.    Yes Historical Provider, MD  aspirin EC 81 MG tablet Take 81 mg by mouth daily.   Yes Historical Provider, MD  azithromycin (ZITHROMAX) 250 MG tablet Take 250 mg by mouth daily. Take two tablets on day one, then take 1 tablet by mouth daily. 03/30/15  Yes Historical Provider, MD  beta carotene w/minerals (OCUVITE) tablet Take 1 tablet by mouth 2 (two) times daily.   Yes Historical Provider, MD  bumetanide (BUMEX) 1 MG tablet Take 1 tablet (1 mg total) by mouth 2 (two) times daily as needed. Take 1 tab BID PRN 12/07/14  Yes Antonieta Iba, MD  calcium carbonate (OS-CAL) 600 MG TABS tablet Take 600 mg by mouth daily with breakfast.   Yes Historical Provider, MD  Cholecalciferol (VITAMIN D-3 PO) Take 1,000 Units by mouth.   Yes Historical Provider, MD  citalopram (CELEXA) 20 MG tablet Take 20 mg by mouth daily.   Yes Historical Provider, MD  diphenhydramine-acetaminophen (TYLENOL PM) 25-500 MG TABS Take 1 tablet by mouth at bedtime as needed.   Yes Historical Provider, MD  doxazosin (CARDURA) 4 MG tablet Take 4 mg by mouth at bedtime.   Yes Historical Provider, MD  erythromycin ophthalmic ointment Place 1 application into both eyes at bedtime.   Yes Historical Provider, MD  fluticasone (FLONASE) 50 MCG/ACT nasal spray Place 1 spray into both nostrils daily.   Yes Historical Provider, MD  gabapentin  (NEURONTIN) 100 MG capsule Take 100 mg by mouth 2 (two) times daily.    Yes Historical Provider, MD  hydrALAZINE (APRESOLINE) 50 MG tablet Take 1 tablet (50 mg total) by mouth 2 (two) times daily. 12/07/14  Yes Antonieta Iba, MD  HYDROcodone-acetaminophen (NORCO/VICODIN) 5-325 MG per tablet Take 1 tablet by mouth as needed for severe pain.  10/20/12  Yes Historical Provider, MD  mometasone-formoterol (DULERA) 100-5 MCG/ACT AERO Inhale 2 puffs into the lungs 2 (two) times daily.    Yes Historical Provider, MD  Multiple Vitamin (MULTIVITAMIN) tablet Take 1 tablet by mouth daily.     Yes Historical Provider, MD  nitroGLYCERIN (NITROSTAT) 0.4 MG SL tablet Place 1 tablet (0.4 mg total) under the tongue every 5 (five) minutes as needed for chest pain. 07/08/14 01/07/16 Yes Antonieta Iba, MD  potassium chloride (K-DUR,KLOR-CON) 10 MEQ  tablet Take 5 mEq by mouth 2 (two) times daily as needed. Patient takes this when she takes Bumex.   Yes Historical Provider, MD  spironolactone (ALDACTONE) 25 MG tablet Take 25 mg by mouth daily.   Yes Historical Provider, MD  tiotropium (SPIRIVA) 18 MCG inhalation capsule Place 1 capsule (18 mcg total) into inhaler and inhale daily. 01/24/12  Yes Antonieta Iba, MD  Tiotropium Bromide Monohydrate (SPIRIVA RESPIMAT) 2.5 MCG/ACT AERS Inhale 1 puff into the lungs daily.   Yes Historical Provider, MD  triamcinolone (KENALOG) 0.025 % cream Apply 1 application topically daily as needed (for arms).   Yes Historical Provider, MD  trimethoprim-polymyxin b (POLYTRIM) ophthalmic solution Place 1 drop into both eyes 2 (two) times daily.   Yes Historical Provider, MD  albuterol (PROAIR HFA) 108 (90 BASE) MCG/ACT inhaler Inhale 2 puffs into the lungs every 6 (six) hours as needed. Patient not taking: Reported on 03/31/2015 01/24/12   Antonieta Iba, MD  arformoterol (BROVANA) 15 MCG/2ML NEBU Take 2 mLs (15 mcg total) by nebulization 2 (two) times daily. Patient not taking: Reported on  03/31/2015 12/26/11   Lupita Leash, MD  ezetimibe (ZETIA) 10 MG tablet Take 1 tablet (10 mg total) by mouth daily. Patient not taking: Reported on 03/31/2015 01/24/12   Antonieta Iba, MD      VITAL SIGNS:  Blood pressure 146/42, pulse 70, temperature 97.9 F (36.6 C), temperature source Oral, resp. rate 18, height 5' (1.524 m), weight 79.243 kg (174 lb 11.2 oz), SpO2 100 %.  PHYSICAL EXAMINATION:  Physical Exam  GENERAL:  81 y.o.-year-old patient lying in the bed with no acute distress.  EYES: Pupils equal, round, reactive to light and accommodation. No scleral icterus. Extraocular muscles intact.  HEENT: Head atraumatic, normocephalic. Oropharynx and nasopharynx clear. No oropharyngeal erythema, moist oral mucosa  NECK:  Supple, no jugular venous distention. No thyroid enlargement, no tenderness.  LUNGS: Normal breath sounds bilaterally, no wheezing, rhonchi, minimal bibasilar rales. No use of accessory muscles of respiration.  CARDIOVASCULAR: S1, S2 normal. 2-3 systolic ejection murmur heard at the base, no rubs, clicks or gallops. ABDOMEN: Soft, nontender, nondistended. Bowel sounds present. No organomegaly or mass.  EXTREMITIES: +2 pitting edema from the knees to the ankles bilaterally, no cyanosis, or clubbing. + 2 pedal & radial pulses b/l.   NEUROLOGIC: Cranial nerves II through XII are intact. No focal Motor or sensory deficits appreciated b/l.  Globally weak PSYCHIATRIC: The patient is alert and oriented x 3. Good affect.  SKIN: No obvious rash, lesion, or ulcer. Bruising noted on the anterior leg on the right.  LABORATORY PANEL:   CBC  Recent Labs Lab 03/31/15 1608  WBC 7.1  HGB 8.5*  HCT 26.7*  PLT 329   ------------------------------------------------------------------------------------------------------------------  Chemistries   Recent Labs Lab 03/31/15 1608  NA 131*  K 4.8  CL 88*  CO2 33*  GLUCOSE 139*  BUN 14  CREATININE 0.91  CALCIUM 9.5    ------------------------------------------------------------------------------------------------------------------  Cardiac Enzymes No results for input(s): TROPONINI in the last 168 hours. ------------------------------------------------------------------------------------------------------------------  RADIOLOGY:  Dg Chest 2 View  03/31/2015   CLINICAL DATA:  Shortness of breath, fatigue and CHF.  EXAM: CHEST - 2 VIEW  COMPARISON:  03/29/2015  FINDINGS: There is evidence of mild interstitial edema. This is more prominent compared to the prior chest x-ray. No pleural effusions are identified. No evidence of focal consolidation, pneumothorax or mass. Stable large hiatal hernia. The heart is mildly enlarged.  IMPRESSION:  Development of mild pulmonary interstitial edema.   Electronically Signed   By: Irish Lack M.D.   On: 03/31/2015 15:50     IMPRESSION AND PLAN:   81 year old female with past medical history of COPD, chronic diastolic CHF, diabetes, hypertension, GERD, history of Sjogren syndrome, osteoporosis, who presented to the hospital referred by her pulmonologist's office due to worsening lower extremity edema and shortness of breath.  #1 CHF-this is acute on chronic diastolic dysfunction. Patient apparently is noncompliant with her Bumex and she only takes it as needed. -We'll resume her Bumex twice a day, would prefer to give IV Lasix although patient is allergic to it. Follow I's and O's and daily weights. -Cardiology consult. Patient is known to Dr. Mariah Milling.   - cont. Hydralazine, Aldactone, continue Bystolic.  #2 history of hypertension-continue hydralazine.  #3 COPD-no acute exacerbation. Continue Spiriva, Dulera.  #4 neuropathy-continue Neurontin.  #5 depression-continue Celexa.  #6 history of paroxysmal atrial fibrillation - currently rate controlled. Continue Bystolic. -Patient is high fall risk and therefore not on long long-term anticoagulation.   All the  records are reviewed and case discussed with ED provider. Management plans discussed with the patient, family and they are in agreement.  CODE STATUS: Full  TOTAL TIME TAKING CARE OF THIS PATIENT: 50 minutes.    Houston Siren M.D on 03/31/2015 at 5:06 PM  Between 7am to 6pm - Pager - [phone]  After 6pm go to [email or web address] - password [email or web address] [facility]  [facility]  Office  [phone]  CC: Primary care physician; Marisue Ivan, MD

## 2015-04-01 LAB — BASIC METABOLIC PANEL
ANION GAP: 8 (ref 5–15)
BUN: 15 mg/dL (ref 6–20)
CALCIUM: 8.8 mg/dL — AB (ref 8.9–10.3)
CO2: 34 mmol/L — ABNORMAL HIGH (ref 22–32)
Chloride: 90 mmol/L — ABNORMAL LOW (ref 101–111)
Creatinine, Ser: 0.85 mg/dL (ref 0.44–1.00)
GFR calc non Af Amer: >60 mL/min (ref >60)
GLUCOSE: 102 mg/dL — AB (ref 65–99)
POTASSIUM: 4.4 mmol/L (ref 3.5–5.1)
SODIUM: 132 mmol/L — AB (ref 135–145)

## 2015-04-01 LAB — CBC
HCT: 25.3 % — ABNORMAL LOW (ref 35.0–47.0)
Hemoglobin: 8 g/dL — ABNORMAL LOW (ref 12.0–16.0)
MCH: 25.8 pg — AB (ref 26.0–34.0)
MCHC: 31.5 g/dL — ABNORMAL LOW (ref 32.0–36.0)
MCV: 82 fL (ref 80.0–100.0)
PLATELETS: 307 10*3/uL (ref 150–440)
RBC: 3.09 MIL/uL — AB (ref 3.80–5.20)
RDW: 16.7 % — ABNORMAL HIGH (ref 11.5–14.5)
WBC: 5 10*3/uL (ref 3.6–11.0)

## 2015-04-01 LAB — TROPONIN T

## 2015-04-01 NOTE — Progress Notes (Addendum)
   04/01/15 2000  Clinical Encounter Type  Visited With Patient  Visit Type Spiritual support;Initial  Spiritual Encounters  Spiritual Needs Prayer  Stress Factors  Patient Stress Factors Health changes   Faith Tradition: Baptist, exploring Lutheranism, 7 Day adventist Status: CHF, good spirits Family: non present but her daughter Eunice Blase is supportive, her son died age 79 in 2022-12-11, she said he was a devout Christian Assessment: The patient spiritual needs centered around conversation or the need of. She would like for me to revisit her and hopes to be healthy enough to vote in the 2016 primary election.. She is worried about her daughter's salvation, she expressed.She says she's been admitted for CHF 4x and that she is 81yrs old. She says she'd like to incorporate a prayer next time. 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 She said she [place].  For all pastoral care needs, please contact via pager [phone] or submit a referral online

## 2015-04-01 NOTE — Care Management (Signed)
Patient was sent to ED from MD office for worsening shortness of breath and edema.  Found to be congestive heart failure. It was discovered that patient had not been taking her Bumex as ordered.  She lives with her daughter and is on chronic home 72. She has had home health through [facility] in the past.  Would be agreeable to have home health nursing again if MD feels it is indicated.

## 2015-04-01 NOTE — Progress Notes (Signed)
SUBJECTIVE:  She is overall feeling better with less leg edema but she still complains of shortness of breath.   Filed Vitals:   03/31/15 1948 04/01/15 0530 04/01/15 0600 04/01/15 0613  BP: 151/95 95/44    Pulse: 72 66    Temp: 98.4 F (36.9 C)     TempSrc: Oral     Resp: 20     Height:      Weight:   174 lb 6.4 oz (79.107 kg) 178 lb 9.6 oz (81.012 kg)  SpO2: 93% 94%      Intake/Output Summary (Last 24 hours) at 04/01/15 0918 Last data filed at 04/01/15 0745  Gross per 24 hour  Intake    300 ml  Output   1025 ml  Net   -725 ml    LABS: Basic Metabolic Panel:  Recent Labs  29/51/88 1608 04/01/15 0441  NA 131* 132*  K 4.8 4.4  CL 88* 90*  CO2 33* 34*  GLUCOSE 139* 102*  BUN 14 15  CREATININE 0.91 0.85  CALCIUM 9.5 8.8*   Liver Function Tests: No results for input(s): AST, ALT, ALKPHOS, BILITOT, PROT, ALBUMIN in the last 72 hours. No results for input(s): LIPASE, AMYLASE in the last 72 hours. CBC:  Recent Labs  03/31/15 1608 04/01/15 0441  WBC 7.1 5.0  HGB 8.5* 8.0*  HCT 26.7* 25.3*  MCV 82.1 82.0  PLT 329 307   Cardiac Enzymes:  Recent Labs  03/31/15 1608  CKTOTAL 77  CKMB 3.5  TROPONINI <0.03   BNP: Invalid input(s): POCBNP D-Dimer: No results for input(s): DDIMER in the last 72 hours. Hemoglobin A1C: No results for input(s): HGBA1C in the last 72 hours. Fasting Lipid Panel: No results for input(s): CHOL, HDL, LDLCALC, TRIG, CHOLHDL, LDLDIRECT in the last 72 hours. Thyroid Function Tests: No results for input(s): TSH, T4TOTAL, T3FREE, THYROIDAB in the last 72 hours.  Invalid input(s): FREET3 Anemia Panel: No results for input(s): VITAMINB12, FOLATE, FERRITIN, TIBC, IRON, RETICCTPCT in the last 72 hours.   PHYSICAL EXAM General: Well developed, well nourished, in no acute distress HEENT:  Normocephalic and atramatic Neck: Mild JVD  Lungs: Clear bilaterally to auscultation and percussion. Heart: HRRR . Normal S1 and S2 without  gallops or murmurs.  Abdomen: Bowel sounds are positive, abdomen soft and non-tender  Msk:  Back normal, normal gait. Normal strength and tone for age. Extremities: +1 edema Neuro: Alert and oriented X 3. Psych:  Good affect, responds appropriately  TELEMETRY: Reviewed telemetry pt in normal sinus rhythm  ASSESSMENT AND PLAN:  1. Acute on chronic diastolic CHF: Pt presents with progressive dyspnea and volume overload that has worsened over the past 2 wks but has been present for over a month. She had only been taking her bumex 2-3 x/wk despite volume overload/edema. Agree with bumex bid for now w/ ongoing spironolactone therapy. She is intolerant to furosemide.   2. Essential HTN: Blood pressure is now running low. I discontinued Doxazocin.    3. HL: Intolerant to statins.  4. COPD: On prn O2 @ home, though she's been requiring it around the clock for at least the past week. Diminished breath sounds on exam. Inhalers per IM.  5. DM II: Per IM.  6. PAF: She has had brief runs of afib since admission with rates into the 130's. She has been asymptomatic. CHA2DS2VASc = 8, however she is not on OAC, presumably 2/2 h/o falls and severe anemia.   7. Anemia: ? Chronicity. Follow.  Lorine Bears, MD, [facility] 04/01/2015 9:18 AM

## 2015-04-01 NOTE — Progress Notes (Signed)
Initial Nutrition Assessment  DOCUMENTATION CODES:     INTERVENTION:   (Meals and Snacks: Cater to patient preferences)  NUTRITION DIAGNOSIS:   (re-assess on follow (pt asleep x 2 visits))  GOAL:  Patient will meet greater than or equal to 90% of their needs  MONITOR:   (Energy Intake, Electrolyte and Renal profile, Glucose Profile,  I/O's)  REASON FOR ASSESSMENT:   (RD Screen- Diagnosis)    ASSESSMENT:  Reason For Admission: CHF PMHx:  Past Medical History  Diagnosis Date  . Essential hypertension   . Diabetes mellitus type II, controlled   . COPD (chronic obstructive pulmonary disease)   . Osteoporosis   . Carotid arterial disease     a. 2012 Carotid dopplers: 40-59% R ICA, 60-79% L ICA  . Tic douloure[place]   . Arthralgia   . Sjoegren syndrome   . TIA (transient ischemic attack)   . Emphysema   . MVP (mitral valve prolapse)   . Chronic diastolic CHF (congestive heart failure)     a. 08/2011 Echo: EF 65-70%, mild LVH, mod dil LA, mildly dil RA, mild MR, mild-mod AoV Sclerosis w/o stenosis, mod-sev elev PA pressures, mild TR.  Marland Kitchen Acid reflux   . Diverticulitis   . Anemia   . History of pneumonia   . Syncope and collapse   . Anemia   . Hip fracture, left   . Hyperlipidemia   . Hiatal hernia   . Chest pain     a. 08/2011 MV: EF 74%, no ischemia.  . Noncompliance     a. h/o not taking bumex as Rx.  Marland Kitchen PAF (paroxysmal atrial fibrillation)     a. CHA2DS2VASc = 8 ->No OAC 2/2 falls.    Typical Fluid/ Food Intake: 20-90% of meals noted per I/O; unable to arouse patient to discuss intake PTA Supplements: None  Labs:  Electrolyte and Renal Profile:  Recent Labs Lab 03/31/15 1608 04/01/15 0441  BUN 14 15  CREATININE 0.91 0.85  NA 131* 132*  K 4.8 4.4   Glu- 102  Meds: MVI, Aldactone  Weight Changes: per weight trend, weight gain noted x 1 year   Height:  Ht Readings from Last 1 Encounters:  03/31/15 5' (1.524 m)    Weight:  Wt Readings  from Last 1 Encounters:  04/01/15 178 lb 9.6 oz (81.012 kg)    Ideal Body Weight:     Wt Readings from Last 10 Encounters:  04/01/15 178 lb 9.6 oz (81.012 kg)  02/25/15 173 lb 8 oz (78.699 kg)  12/17/14 168 lb (76.204 kg)  12/28/14 167 lb (75.751 kg)  12/07/14 170 lb 4 oz (77.225 kg)  10/08/14 160 lb (72.576 kg)  08/12/14 160 lb (72.576 kg)  07/08/14 157 lb (71.215 kg)  03/11/14 158 lb (71.668 kg)  02/02/14 161 lb 12 oz (73.369 kg)    BMI:  Body mass index is 34.88 kg/(m^2).  Skin:  Reviewed, no issues  Diet Order:  Diet Heart Room service appropriate?: Yes; Fluid consistency:: Thin  EDUCATION NEEDS:  Education needs no appropriate at this time   Intake/Output Summary (Last 24 hours) at 04/01/15 1429 Last data filed at 04/01/15 1401  Gross per 24 hour  Intake    340 ml  Output   2175 ml  Net  -1835 ml    Last BM:  6/8  Joeseph Amor, RDN Pager: [phone] Office: [phone]  LOW Care Level

## 2015-04-01 NOTE — Progress Notes (Addendum)
[facility] - [place] at [facility]   PATIENT NAME: Catherine Holmes    MR#:  [id number]  DATE OF BIRTH:  [birth year]  SUBJECTIVE:  Patient feeling fine this am  REVIEW OF SYSTEMS:    Review of Systems  Constitutional: Positive for malaise/fatigue. Negative for fever and chills.  Respiratory: Positive for cough and shortness of breath.   Cardiovascular: Positive for leg swelling and PND. Negative for chest pain, palpitations and claudication.  Gastrointestinal: Negative for nausea, vomiting, abdominal pain, diarrhea and constipation.  Genitourinary: Negative for urgency and frequency.  Neurological: Positive for weakness.    Tolerating Diet:YES      DRUG ALLERGIES:   Allergies  Allergen Reactions  . Atorvastatin   . Codeine   . Doxycycline   . Epinephrine Hives and Itching  . Erythromycin   . Fluticasone-Salmeterol   . Lasix [Furosemide] Itching  . Levofloxacin   . Penicillins   . Propoxyphene Hcl   . Rosuvastatin   . Simvastatin   . Singlet [Chlorphen-Pe-Acetaminophen] Other (See Comments)    Reaction: Wheezing  . Sulfamethoxazole-Trimethoprim   . Teriparatide   . Cefaclor Rash and Other (See Comments)    Reaction: Delirious    . Glipizide Hives  . Lisinopril Hives and Itching    VITALS:  Blood pressure 95/44, pulse 66, temperature 98.4 F (36.9 C), temperature source Oral, resp. rate 20, height 5' (1.524 m), weight 81.012 kg (178 lb 9.6 oz), SpO2 94 %.  PHYSICAL EXAMINATION:   Physical Exam  Constitutional: She is oriented to person, place, and time and well-developed, well-nourished, and in no distress. No distress.  HENT:  Head: Normocephalic.  Eyes: Pupils are equal, round, and reactive to light.  Neck: Normal range of motion. Neck supple. JVD present. No tracheal deviation present.  Cardiovascular: Normal rate, regular rhythm and normal heart sounds.  Exam reveals no friction rub.   No murmur heard. Pulmonary/Chest: Effort  normal and breath sounds normal. No respiratory distress. She has no wheezes. She has no rales. She exhibits no tenderness.  Abdominal: Soft. Bowel sounds are normal. There is no tenderness. There is no rebound and no guarding.  Musculoskeletal: Normal range of motion. She exhibits edema.  Neurological: She is alert and oriented to person, place, and time. No cranial nerve deficit. Coordination normal.  Skin: Skin is warm and dry. She is not diaphoretic. No erythema.  Psychiatric: Affect and judgment normal.      LABORATORY PANEL:   CBC  Recent Labs Lab 04/01/15 0441  WBC 5.0  HGB 8.0*  HCT 25.3*  PLT 307   ------------------------------------------------------------------------------------------------------------------  Chemistries   Recent Labs Lab 04/01/15 0441  NA 132*  K 4.4  CL 90*  CO2 34*  GLUCOSE 102*  BUN 15  CREATININE 0.85  CALCIUM 8.8*   ------------------------------------------------------------------------------------------------------------------  Cardiac Enzymes  Recent Labs Lab 03/31/15 1608  TROPONINI <0.03   ------------------------------------------------------------------------------------------------------------------  RADIOLOGY:  Dg Chest 2 View  03/31/2015     IMPRESSION: Development of mild pulmonary interstitial edema.   Electronically Signed   By: Irish Lack M.D.   On: 03/31/2015 15:50     ASSESSMENT AND PLAN:   81 year old female with past medical history significant for COPD and second heart failure presents with lotion and edema and shortness of breath and found to have acute on chronic diastolic heart failure.   1. Acute on chronic diastolic heart failure: Patient continues to have symptoms of dyspnea and volume overload. However she states that she has  improved. We will continue Bumex. Continue I's and O's. I appreciate cardiology consultation.    2. essential hypertension: Patient is on Bystolic as well as  hydralazine. Her blood pressure was low this morning. It is noted that her blood pressure is fluctuating. I will continue to monitor both blood pressure and adjust medications accordingly.   3. COPD: Patient does wear oxygen 2 L for her COPD. Patient does not appear to be in exacerbation at this time. Continue Spiriva and Dulera.  4. Neuropathy: Patient will continue Neurontin  5. Depression: Patient is on Celexa which she will continue.  6. History of Paroxysmal  fibrillation: Patient appears to be in normal sinus rhythm at this time. Patient will continue Bystolic. Patient is high fall risk and therefore not on long-term anticoagulation.  All the records are reviewed and case discussed with Cardiology Management plans discussed with the patient and he  is in agreement.  CODE STATUS: full TOL TIME TAKING CARE OF THIS PATIENT: 33 minutes.   POSSIBLE D/C IN 2 DAYS, DEPENDING ON CLINICAL CONDITION.   Sumaya Riedesel M.D on 04/01/2015 at 8:44 AM  Between 7am to 6pm - Pager - [phone] After 6pm go to [email or web address] - password [email or web address] [facility]  [place] [facility]  Office  [phone]  CC: Primary care physician; Marisue Ivan, MD

## 2015-04-02 DIAGNOSIS — D649 Anemia, unspecified: Secondary | ICD-10-CM | POA: Insufficient documentation

## 2015-04-02 DIAGNOSIS — I5033 Acute on chronic diastolic (congestive) heart failure: Secondary | ICD-10-CM | POA: Insufficient documentation

## 2015-04-02 DIAGNOSIS — I10 Essential (primary) hypertension: Secondary | ICD-10-CM

## 2015-04-02 DIAGNOSIS — E119 Type 2 diabetes mellitus without complications: Secondary | ICD-10-CM

## 2015-04-02 MED ORDER — ALUM & MAG HYDROXIDE-SIMETH 200-200-20 MG/5ML PO SUSP
30.0000 mL | Freq: Four times a day (QID) | ORAL | Status: DC | PRN
  Administered 2015-04-02: 30 mL via ORAL
  Filled 2015-04-02: qty 30

## 2015-04-02 NOTE — Progress Notes (Addendum)
[facility] - [place] at [facility]   PATIENT NAME: Catherine Holmes    MR#:  [id number]  DATE OF BIRTH:  1928-09-24  SUBJECTIVE:  Patient doing well this am still with LEE SOB improved  REVIEW OF SYSTEMS:    Review of Systems  Constitutional: Negative for fever, chills and malaise/fatigue.  Respiratory: Positive for cough and shortness of breath.   Cardiovascular: Positive for leg swelling. Negative for chest pain, palpitations, claudication and PND.  Gastrointestinal: Negative for nausea, vomiting, abdominal pain, diarrhea and constipation.  Genitourinary: Negative for urgency and frequency.  Musculoskeletal: Negative for myalgias.  Neurological: Negative for weakness.    Tolerating Diet:YES      DRUG ALLERGIES:   Allergies  Allergen Reactions  . Atorvastatin   . Codeine   . Doxycycline   . Epinephrine Hives and Itching  . Erythromycin   . Fluticasone-Salmeterol   . Lasix [Furosemide] Itching  . Levofloxacin   . Penicillins   . Propoxyphene Hcl   . Rosuvastatin   . Simvastatin   . Singlet [Chlorphen-Pe-Acetaminophen] Other (See Comments)    Reaction: Wheezing  . Sulfamethoxazole-Trimethoprim   . Teriparatide   . Cefaclor Rash and Other (See Comments)    Reaction: Delirious    . Glipizide Hives  . Lisinopril Hives and Itching    VITALS:  Blood pressure 106/49, pulse 81, temperature 97.6 F (36.4 C), temperature source Oral, resp. rate 18, height 5' (1.524 m), weight 76.613 kg (168 lb 14.4 oz), SpO2 97 %.  PHYSICAL EXAMINATION:   Physical Exam  Constitutional: She is oriented to person, place, and time and well-developed, well-nourished, and in no distress. No distress.  HENT:  Head: Normocephalic.  Eyes: Pupils are equal, round, and reactive to light.  Neck: Normal range of motion. Neck supple. No JVD present. No tracheal deviation present.  Cardiovascular: Normal rate, regular rhythm and normal heart sounds.  Exam reveals no  friction rub.   No murmur heard. Pulmonary/Chest: Effort normal and breath sounds normal. No respiratory distress. She has no wheezes. She has no rales. She exhibits no tenderness.  Abdominal: Soft. Bowel sounds are normal. There is no tenderness. There is no rebound and no guarding.  Musculoskeletal: Normal range of motion. She exhibits edema.  Neurological: She is alert and oriented to person, place, and time. No cranial nerve deficit. Coordination normal.  Skin: Skin is warm and dry. She is not diaphoretic. No erythema.  Psychiatric: Affect and judgment normal.      LABORATORY PANEL:   CBC  Recent Labs Lab 04/01/15 0441  WBC 5.0  HGB 8.0*  HCT 25.3*  PLT 307   ------------------------------------------------------------------------------------------------------------------  Chemistries   Recent Labs Lab 04/01/15 0441  NA 132*  K 4.4  CL 90*  CO2 34*  GLUCOSE 102*  BUN 15  CREATININE 0.85  CALCIUM 8.8*   ------------------------------------------------------------------------------------------------------------------  Cardiac Enzymes  Recent Labs Lab 03/31/15 1608  TROPONINI <0.03   ------------------------------------------------------------------------------------------------------------------  RADIOLOGY:  Dg Chest 2 View  03/31/2015     IMPRESSION: Development of mild pulmonary interstitial edema.   Electronically Signed   By: Irish Lack M.D.   On: 03/31/2015 15:50     ASSESSMENT AND PLAN:   81 year old female with past medical history significant for COPD and second heart failure presents with lotion and edema and shortness of breath and found to have acute on chronic diastolic heart failure.   1. Acute on chronic diastolic heart failure: Patient continues to have LEE. However she states  that she has improved. We will continue Bumex. Continue I's and O's. I appreciate cardiology consultation. She has diuresed -2250. Her creatinine remains  stable. She still has room for more diuresis.  2. Essential hypertension: Patient is on Bystolic as well as hydralazine. Her blood pressure is low therefore am going to discontinue hydralazine.  3. COPD: Patient does wear oxygen 2 L for her COPD. Patient does not appear to be in exacerbation at this time. Continue Spiriva and Dulera.  4. Neuropathy: Patient will continue Neurontin  5. Depression: Patient is on Celexa which she will continue.  6. History of Paroxysmal  fibrillation: Patient appears to be in normal sinus rhythm at this time. Patient will continue Bystolic. Patient is high fall risk and therefore not on long-term anticoagulation.   7. Anemia of chronic disease: Patient's hemoglobin is slightly lower than yesterday. I will continue to monitor. If her hemoglobin is below 8 I will transfuse 1 unit of PRBCs. Patient has already consented for blood. CBC is ordered for a.m.    Management plans discussed with the patient and she  is in agreement.  CODE STATUS: full TOL TIME TAKING CARE OF THIS PATIENT: 25 minutes.   POSSIBLE D/C IN 1-2 DAYS, DEPENDING ON CLINICAL CONDITION.   Avanthika Dehnert M.D on 04/02/2015 at 9:52 AM  Between 7am to 6pm - Pager - [phone] After 6pm go to [email or web address] - password [email or web address] [facility]  [place] [facility]  Office  [phone]  CC: Primary care physician; Marisue Ivan, MD

## 2015-04-02 NOTE — Progress Notes (Signed)
   SUBJECTIVE:   Continued shortness of breath, some cough Legs are still swollen but better Significant fatigue, appetite mildly improved. Has not ambulated much    Filed Vitals:   04/02/15 1125 04/02/15 1126 04/02/15 1745 04/02/15 1750  BP: 81/40 130/60 104/60 110/50  Pulse: 71 73    Temp: 98.1 F (36.7 C) 98.1 F (36.7 C)    TempSrc: Oral     Resp: 20 20    Height:      Weight:      SpO2: 89% 84%      Intake/Output Summary (Last 24 hours) at 04/02/15 1750 Last data filed at 04/02/15 1512  Gross per 24 hour  Intake    240 ml  Output   2000 ml  Net  -1760 ml    LABS: Basic Metabolic Panel:  Recent Labs  65/46/50 1608 04/01/15 0441  NA 131* 132*  K 4.8 4.4  CL 88* 90*  CO2 33* 34*  GLUCOSE 139* 102*  BUN 14 15  CREATININE 0.91 0.85  CALCIUM 9.5 8.8*   Liver Function Tests: No results for input(s): AST, ALT, ALKPHOS, BILITOT, PROT, ALBUMIN in the last 72 hours. No results for input(s): LIPASE, AMYLASE in the last 72 hours. CBC:  Recent Labs  03/31/15 1608 04/01/15 0441  WBC 7.1 5.0  HGB 8.5* 8.0*  HCT 26.7* 25.3*  MCV 82.1 82.0  PLT 329 307   Cardiac Enzymes:  Recent Labs  03/31/15 1608  CKTOTAL 77  CKMB 3.5  TROPONINI <0.03   BNP: Invalid input(s): POCBNP D-Dimer: No results for input(s): DDIMER in the last 72 hours. Hemoglobin A1C: No results for input(s): HGBA1C in the last 72 hours. Fasting Lipid Panel: No results for input(s): CHOL, HDL, LDLCALC, TRIG, CHOLHDL, LDLDIRECT in the last 72 hours. Thyroid Function Tests: No results for input(s): TSH, T4TOTAL, T3FREE, THYROIDAB in the last 72 hours.  Invalid input(s): FREET3 Anemia Panel: No results for input(s): VITAMINB12, FOLATE, FERRITIN, TIBC, IRON, RETICCTPCT in the last 72 hours.   PHYSICAL EXAM General: Well developed, well nourished, in no acute distress HEENT:  Normocephalic and atramatic Neck: Mild JVD  Lungs: Crackles at the bases b/l Heart: HRRR . Normal S1 and S2  without gallops or murmurs.  Abdomen: Bowel sounds are positive, abdomen soft and non-tender  Msk:  Back normal, normal gait. Normal strength and tone for age. Extremities: +1 edema Neuro: Alert and oriented X 3. Psych:  Good affect, responds appropriately  TELEMETRY: Reviewed telemetry pt in normal sinus rhythm  ASSESSMENT AND PLAN:  1. Acute on chronic diastolic CHF:  Long hx of acute on chronic diastolic CHF exacerbations, Dietary and medication noncompliance issues Typically had been taking bumex 1 mg daily, though on most recent office visit was not taking any diuretic ----Agree with continued bumex 1 mg BID.   2. Essential HTN:  Blood pressure is now running low.  Will place hold parameters on meds  3. Anemia Severe, likely contributing to above issues Previously managed by hematology,  Prior iron infusions, none recently Needs close outpt follow up with hematology (suspect she may require iron infusion)  4. HL:  Intolerant to statins.  4. COPD:  Stable  5. DM II:  Per IM.  6. PAF: brief runs of afib since admission with rates into the 130's. asymptomatic. CHA2DS2VASc = 8, not on anticoagulation in the setting of severe anemia.     Julien Nordmann, MD, Ph.D. [facility]  04/02/2015 5:50 PM

## 2015-04-03 LAB — CBC
HCT: 27.7 % — ABNORMAL LOW (ref 35.0–47.0)
Hemoglobin: 8.8 g/dL — ABNORMAL LOW (ref 12.0–16.0)
MCH: 26.1 pg (ref 26.0–34.0)
MCHC: 31.8 g/dL — ABNORMAL LOW (ref 32.0–36.0)
MCV: 82 fL (ref 80.0–100.0)
Platelets: 346 10*3/uL (ref 150–440)
RBC: 3.37 MIL/uL — ABNORMAL LOW (ref 3.80–5.20)
RDW: 16.5 % — AB (ref 11.5–14.5)
WBC: 7.3 10*3/uL (ref 3.6–11.0)

## 2015-04-03 LAB — BASIC METABOLIC PANEL
Anion gap: 7 (ref 5–15)
BUN: 24 mg/dL — ABNORMAL HIGH (ref 6–20)
CO2: 36 mmol/L — AB (ref 22–32)
Calcium: 8.6 mg/dL — ABNORMAL LOW (ref 8.9–10.3)
Chloride: 86 mmol/L — ABNORMAL LOW (ref 101–111)
Creatinine, Ser: 0.75 mg/dL (ref 0.44–1.00)
GFR calc Af Amer: >60 mL/min (ref >60)
GFR calc non Af Amer: >60 mL/min (ref >60)
Glucose, Bld: 100 mg/dL — ABNORMAL HIGH (ref 65–99)
Potassium: 4.4 mmol/L (ref 3.5–5.1)
SODIUM: 129 mmol/L — AB (ref 135–145)

## 2015-04-03 MED ORDER — NEBIVOLOL HCL 5 MG PO TABS: 5.0000 mg | ORAL_TABLET | Freq: Every day | ORAL | Status: AC

## 2015-04-03 MED ORDER — BUMETANIDE 1 MG PO TABS: 1.0000 mg | ORAL_TABLET | Freq: Every day | ORAL | Status: DC

## 2015-04-03 NOTE — Care Management Note (Signed)
Case Management Note  Patient Details  Name: Catherine Holmes MRN: [id number] Date of Birth: [birth year]  Subjective/Objective:    Discharging home today. Referral for PT and RN faxed and called to [facility]. [name] has chronic oxygen in place at home. Daughter can bring portable tank to [facility].                    Expected Discharge Date:                  Expected Discharge Plan:     In-House Referral:     Discharge planning Services     Post Acute Care Choice:    Choice offered to:     DME Arranged:    DME Agency:     HH Arranged:    HH Agency:     Status of Service:     Medicare Important Message Given:  Yes Date Medicare IM Given:  04/01/15 Medicare IM give by:  Ermalene Searing Date Additional Medicare IM Given:    Additional Medicare Important Message give by:     If discussed at Long Length of Stay Meetings, dates discussed:    Additional Comments:  Jaythen Hamme A, RN 04/03/2015, 11:37 AM

## 2015-04-03 NOTE — Progress Notes (Signed)
Pt. Slept through the night in the reclining chair. No acute distresse. VSS. She ran NSR throughout the night on tele. Pt. Particular in the manner in which she takes her meds. Will continue to monitor.

## 2015-04-03 NOTE — Care Management Note (Signed)
Case Management Note  Patient Details  Name: Catherine Holmes MRN: [id number] Date of Birth: 1928-05-08  Subjective/Objective:         Discharge orders put in this morning are cancelled by Dr Juliene Pina and Dr Mariah Milling. Anticipate discharge home with home health tomorrow.               Expected Discharge Date:                  Expected Discharge Plan:     In-House Referral:     Discharge planning Services     Post Acute Care Choice:    Choice offered to:     DME Arranged:    DME Agency:     HH Arranged:    HH Agency:     Status of Service:     Medicare Important Message Given:  Yes Date Medicare IM Given:  04/01/15 Medicare IM give by:  Ermalene Searing Date Additional Medicare IM Given:    Additional Medicare Important Message give by:     If discussed at Long Length of Stay Meetings, dates discussed:    Additional Comments:  Dave Mannes A, RN 04/03/2015, 5:35 PM

## 2015-04-03 NOTE — Evaluation (Signed)
Physical Therapy Evaluation Patient Details Name: LOISANN PELEGRIN MRN: [id number] DOB: 1928-04-28 Today's Date: 04/03/2015   History of Present Illness  Pt here with CHF and LE swelling  Clinical Impression  Pt initially not very interested in participating with PT stating she is too tired.  She does eventually agree to PT eval and actually does well with ambulation and shows good strength.  She reports that she can do some limited walking on room air, but her O2 sats drop as low as 75% with 100 ft of ambulation.  She is safe and displays good balance and functional strength, but ultimately her respiratory issues seem to be her biggest limiter.    Follow Up Recommendations No PT follow up (pt scheduled for pulmonary rehab in ~4 weeks)    Equipment Recommendations       Recommendations for Other Services       Precautions / Restrictions Restrictions Weight Bearing Restrictions: No      Mobility  Bed Mobility                  Transfers Overall transfer level: Modified independent Equipment used: Rolling walker (2 wheeled)             General transfer comment: Pt able to rise with good confidence w/o using the walker to maintain balance  Ambulation/Gait Ambulation/Gait assistance: Supervision Ambulation Distance (Feet): 100 Feet Assistive device: Rolling walker (2 wheeled)       General Gait Details: Pt with slow, stooped, but ultimately safe ambulation.  She does however want to walk w/o o2 and her sats drop relatively quickly to the mid 70s on room air, increases with rest and 3 liters O2  Stairs            Wheelchair Mobility    Modified Rankin (Stroke Patients Only)       Balance                                             Pertinent Vitals/Pain Pain Assessment: No/denies pain    Home Living Family/patient expects to be discharged to:: Private residence Living Arrangements: Children Available Help at Discharge:  Family Type of Home: House         Home Equipment: Gilmer Mor - single point      Prior Function Level of Independence: Independent with assistive device(s)         Comments: pt uses 2 liters at home     Hand Dominance        Extremity/Trunk Assessment   Upper Extremity Assessment: Overall WFL for tasks assessed           Lower Extremity Assessment: Overall WFL for tasks assessed         Communication   Communication: No difficulties  Cognition Arousal/Alertness: Awake/alert Behavior During Therapy: WFL for tasks assessed/performed Overall Cognitive Status: Within Functional Limits for tasks assessed                      General Comments      Exercises        Assessment/Plan    PT Assessment Patient needs continued PT services  PT Diagnosis Generalized weakness;Difficulty walking   PT Problem List    PT Treatment Interventions     PT Goals (Current goals can be found in the Care Plan section) Acute Rehab PT  Goals Patient Stated Goal: "I want to go home, I just don't know it I'm ready today" PT Goal Formulation: With patient Time For Goal Achievement: 04/17/15 Potential to Achieve Goals: Good    Frequency Min 2X/week   Barriers to discharge        Co-evaluation               End of Session Equipment Utilized During Treatment: Gait belt Activity Tolerance: Patient limited by lethargy (pt does not feel overly fatigued despite drop in O2) Patient left: with chair alarm set Nurse Communication: Mobility status (O2 situation)         Time: 1610-9604 PT Time Calculation (min) (ACUTE ONLY): 21 min   Charges:   PT Evaluation $Initial PT Evaluation Tier I: 1 Procedure     PT G Codes:       Loran Senters, PT, DPT [phone]  Malachi Pro 04/03/2015, 4:12 PM

## 2015-04-03 NOTE — Progress Notes (Signed)
Physical therapy worked with patient, patient's oxygen dropped to 75% on room air. Patient stated she has not been wearing oxygen at home as long as her saturation stays above 85% and had stopped wearing it all together. Instructed the patient that she will need to wear her oxygen at all times once at home, especially while walking and sleeping. Dr. Mariah Milling saw the patient and stated it may be beneficial to pend the discharge until tomorrow. Patient is also not comfortable going home today. Dr. Juliene Pina notified of above information and MD stated to cancel discharge.

## 2015-04-03 NOTE — Progress Notes (Addendum)
SUBJECTIVE:   SOB improved,  Leg edema much better Significant fatigue, appetite improved. Has not ambulated much, does not feel that she can go home today, too weak   Filed Vitals:   04/03/15 0543 04/03/15 0945 04/03/15 0949 04/03/15 1120  BP: 90/58 94/30 143/43 118/72  Pulse: 64 64  66  Temp: 97.4 F (36.3 C)   98.1 F (36.7 C)  TempSrc:    Oral  Resp: 18   18  Height:      Weight: 169 lb 8 oz (76.885 kg)     SpO2: 100%   100%    Intake/Output Summary (Last 24 hours) at 04/03/15 1308 Last data filed at 04/03/15 1207  Gross per 24 hour  Intake    480 ml  Output   2700 ml  Net  -2220 ml    LABS: Basic Metabolic Panel:  Recent Labs  95/62/13 0441 04/03/15 0359  NA 132* 129*  K 4.4 4.4  CL 90* 86*  CO2 34* 36*  GLUCOSE 102* 100*  BUN 15 24*  CREATININE 0.85 0.75  CALCIUM 8.8* 8.6*   Liver Function Tests: No results for input(s): AST, ALT, ALKPHOS, BILITOT, PROT, ALBUMIN in the last 72 hours. No results for input(s): LIPASE, AMYLASE in the last 72 hours. CBC:  Recent Labs  04/01/15 0441 04/03/15 0359  WBC 5.0 7.3  HGB 8.0* 8.8*  HCT 25.3* 27.7*  MCV 82.0 82.0  PLT 307 346   Cardiac Enzymes:  Recent Labs  03/31/15 1608  CKTOTAL 77  CKMB 3.5  TROPONINI <0.03   BNP: Invalid input(s): POCBNP D-Dimer: No results for input(s): DDIMER in the last 72 hours. Hemoglobin A1C: No results for input(s): HGBA1C in the last 72 hours. Fasting Lipid Panel: No results for input(s): CHOL, HDL, LDLCALC, TRIG, CHOLHDL, LDLDIRECT in the last 72 hours. Thyroid Function Tests: No results for input(s): TSH, T4TOTAL, T3FREE, THYROIDAB in the last 72 hours.  Invalid input(s): FREET3 Anemia Panel: No results for input(s): VITAMINB12, FOLATE, FERRITIN, TIBC, IRON, RETICCTPCT in the last 72 hours.   PHYSICAL EXAM General: Well developed, well nourished, in no acute distress HEENT:  Normocephalic and atramatic Neck: Mild JVD  Lungs: clear to  auscultation Heart: HRRR . Normal S1 and S2 without gallops or murmurs.  Abdomen: Bowel sounds are positive, abdomen soft and non-tender  Msk:  Back normal, normal gait. Normal strength and tone for age. Extremities: trace edema around the ankles Neuro: Alert and oriented X 3. Psych:  Good affect, responds appropriately  TELEMETRY: Reviewed telemetry pt in normal sinus rhythm, 76 bpm  ASSESSMENT AND PLAN:  1. Acute on chronic diastolic CHF:  Long hx of acute on chronic diastolic CHF exacerbations, Dietary and medication noncompliance issues Typically had been taking bumex 1 mg daily, though on most recent office visit was not taking any diuretic She has done well on bumex 1 mg po BID Would now change to bumex daily, continue on this dose at home  2. Essential HTN:  Stable on current meds  3. Anemia  likely contributing to weakness, SOB Previously managed by hematology,  Prior iron infusions, none recently Needs close outpt follow up with hematology (suspect she may require iron infusion)  4. HL:  Intolerant to statins.  4. COPD:  Stable  5. DM II:  Per IM.  6. PAF: None recently on tele CHA2DS2VASc = 8, not on anticoagulation in the setting of severe anemia.   DISPO: She feels weak, does not think that she  can go home today Refused PT at lunch time, Talked with PT, they will try again this afternoon.    Julien Nordmann, MD, Ph.D. [facility]  04/03/2015 1:08 PM

## 2015-04-03 NOTE — Discharge Summary (Addendum)
[facility] - [place] at [facility]   PATIENT NAME: Catherine Holmes    MR#:  [id number]  DATE OF BIRTH:  [birth year]  DATE OF ADMISSION:  03/31/2015 ADMITTING PHYSICIAN: Houston Siren, MD  DATE OF DISCHARGE: 04/04/2015  PRIMARY CARE PHYSICIAN: Marisue Ivan, MD    ADMISSION DIAGNOSIS:  COPD CHF  DISCHARGE DIAGNOSIS:  Active Problems:   CHF (congestive heart failure)   Essential hypertension   Diabetes mellitus type II, controlled   Absolute anemia   Acute on chronic diastolic CHF (congestive heart failure)   SECONDARY DIAGNOSIS:   Past Medical History  Diagnosis Date  . Essential hypertension   . Diabetes mellitus type II, controlled   . COPD (chronic obstructive pulmonary disease)   . Osteoporosis   . Carotid arterial disease     a. 2012 Carotid dopplers: 40-59% R ICA, 60-79% L ICA  . Tic douloureux   . Arthralgia   . Sjoegren syndrome   . TIA (transient ischemic attack)   . Emphysema   . MVP (mitral valve prolapse)   . Chronic diastolic CHF (congestive heart failure)     a. 08/2011 Echo: EF 65-70%, mild LVH, mod dil LA, mildly dil RA, mild MR, mild-mod AoV Sclerosis w/o stenosis, mod-sev elev PA pressures, mild TR.  Marland Kitchen Acid reflux   . Diverticulitis   . Anemia   . History of pneumonia   . Syncope and collapse   . Anemia   . Hip fracture, left   . Hyperlipidemia   . Hiatal hernia   . Chest pain     a. 08/2011 MV: EF 74%, no ischemia.  . Noncompliance     a. h/o not taking bumex as Rx.  Marland Kitchen PAF (paroxysmal atrial fibrillation)     a. CHA2DS2VASc = 8 ->No OAC 2/2 falls.    HOSPITAL COURSE:  This is an 81 year old female with a history of chronic diastolic heart failure who presented with acute on chronic diastolic heart failure. For further details please further H&P.  1. Acute on chronic diastolic heart failure: Patient was admitted to the hospital service.. Patient was seen and evaluated by cardiology. Patient was diuresed with  Bumex given her history of allergy to Lasix. She is currently almost 5 L negative. Her symptoms have much improved. Her lower extremity has much improved as well. Her sodium level is noted to be slightly low. She will follow-up with cardiology within the next 2-3 days for repeat BMP. It should be noted the patient is not very compliant with her medications and her diet.  2. Essential hypertension: Patient's blood pressure was running slightly low. We made some adjustments to her medications and her blood pressure needs to be followed as an outpatient.  3. anemia of chronic disease: Patient's hemoglobin has remained stable. She has had iron infusions in the past.   4. COPD: Patient wears oxygen for her COPD. Patient did not appear to be exacerbation or hospitalization. She will continue Spiriva and Dulera.  4. Neuropathy: Patient will continue Neurontin. 5. Depression: Patient will continue on Celexa  6. History of Paroxysmal fibrillation: Patient appears to be in normal sinus rhythm at this time. Patient will continue Bystolic. Patient is high fall risk and therefore not on long-term anticoagulation.   7. Hyponatremia: Patient's sodium level was 128 and she will see Dr. Mariah Milling on  Tuesday  DISCHARGE CONDITIONS AND DIET:  Home with home health Heart Healthy Diet  CONSULTS OBTAINED:  Treatment Team:  Iran Ouch,  MD  DRUG ALLERGIES:   Allergies  Allergen Reactions  . Atorvastatin   . Codeine   . Doxycycline   . Epinephrine Hives and Itching  . Erythromycin   . Fluticasone-Salmeterol   . Lasix [Furosemide] Itching  . Levofloxacin   . Penicillins   . Propoxyphene Hcl   . Rosuvastatin   . Simvastatin   . Singlet [Chlorphen-Pe-Acetaminophen] Other (See Comments)    Reaction: Wheezing  . Sulfamethoxazole-Trimethoprim   . Teriparatide   . Cefaclor Rash and Other (See Comments)    Reaction: Delirious    . Glipizide Hives  . Lisinopril Hives and Itching    DISCHARGE  MEDICATIONS:   Current Discharge Medication List    CONTINUE these medications which have NOT CHANGED   Details  albuterol (PROVENTIL) (2.5 MG/3ML) 0.083% nebulizer solution Take 2.5 mg by nebulization every 6 (six) hours as needed for wheezing or shortness of breath.     aspirin EC 81 MG tablet Take 81 mg by mouth daily.    azithromycin (ZITHROMAX) 250 MG tablet Take 250 mg by mouth daily. Take two tablets on day one, then take 1 tablet by mouth daily.    beta carotene w/minerals (OCUVITE) tablet Take 1 tablet by mouth 2 (two) times daily.    bumetanide (BUMEX) 1 MG tablet Take 1 tablet (1 mg total) by mouth 2 (two) times daily as needed. Take 1 tab BID PRN Qty: 180 tablet, Refills: 3    calcium carbonate (OS-CAL) 600 MG TABS tablet Take 600 mg by mouth daily with breakfast.    Cholecalciferol (VITAMIN D-3 PO) Take 1,000 Units by mouth.    citalopram (CELEXA) 20 MG tablet Take 20 mg by mouth daily.    diphenhydramine-acetaminophen (TYLENOL PM) 25-500 MG TABS Take 1 tablet by mouth at bedtime as needed.    doxazosin (CARDURA) 4 MG tablet Take 4 mg by mouth at bedtime.    erythromycin ophthalmic ointment Place 1 application into both eyes at bedtime.    fluticasone (FLONASE) 50 MCG/ACT nasal spray Place 1 spray into both nostrils daily.    gabapentin (NEURONTIN) 100 MG capsule Take 100 mg by mouth 2 (two) times daily.     hydrALAZINE (APRESOLINE) 50 MG tablet Take 1 tablet (50 mg total) by mouth 2 (two) times daily. Qty: 180 tablet, Refills: 3    HYDROcodone-acetaminophen (NORCO/VICODIN) 5-325 MG per tablet Take 1 tablet by mouth as needed for severe pain.     mometasone-formoterol (DULERA) 100-5 MCG/ACT AERO Inhale 2 puffs into the lungs 2 (two) times daily.     Multiple Vitamin (MULTIVITAMIN) tablet Take 1 tablet by mouth daily.      nitroGLYCERIN (NITROSTAT) 0.4 MG SL tablet Place 1 tablet (0.4 mg total) under the tongue every 5 (five) minutes as needed for chest  pain. Qty: 25 tablet, Refills: 6    potassium chloride (K-DUR,KLOR-CON) 10 MEQ tablet Take 5 mEq by mouth 2 (two) times daily as needed. Patient takes this when she takes Bumex.    spironolactone (ALDACTONE) 25 MG tablet Take 25 mg by mouth daily.    tiotropium (SPIRIVA) 18 MCG inhalation capsule Place 1 capsule (18 mcg total) into inhaler and inhale daily. Qty: 90 capsule, Refills: 0    Tiotropium Bromide Monohydrate (SPIRIVA RESPIMAT) 2.5 MCG/ACT AERS Inhale 1 puff into the lungs daily.    triamcinolone (KENALOG) 0.025 % cream Apply 1 application topically daily as needed (for arms).    trimethoprim-polymyxin b (POLYTRIM) ophthalmic solution Place 1 drop into both eyes  2 (two) times daily.    albuterol (PROAIR HFA) 108 (90 BASE) MCG/ACT inhaler Inhale 2 puffs into the lungs every 6 (six) hours as needed. Qty: 3 Inhaler, Refills: 0    arformoterol (BROVANA) 15 MCG/2ML NEBU Take 2 mLs (15 mcg total) by nebulization 2 (two) times daily. Qty: 120 mL, Refills: 4    ezetimibe (ZETIA) 10 MG tablet Take 1 tablet (10 mg total) by mouth daily. Qty: 90 tablet, Refills: 3              Today   CHIEF COMPLAINT:  Patient is doing well this morning. Patient reports improved lower extremity edema. Patient denies sureness of breath. Patient does wear oxygen at home.   VITAL SIGNS:  Blood pressure 143/43, pulse 64, temperature 97.4 F (36.3 C), temperature source Oral, resp. rate 18, height 5' (1.524 m), weight 76.885 kg (169 lb 8 oz), SpO2 100 %.   REVIEW OF SYSTEMS:  Review of Systems  Respiratory: Negative for sputum production and shortness of breath.   Cardiovascular: Positive for leg swelling. Negative for chest pain and palpitations.  Gastrointestinal: Negative for nausea, vomiting and abdominal pain.  Neurological: Negative for focal weakness.     PHYSICAL EXAMINATION:  GENERAL:  81 y.o.-year-old patient lying in the bed with no acute distress.  NECK:  Supple, no  jugular venous distention. No thyroid enlargement, no tenderness.  LUNGS: Normal breath sounds bilaterally, no wheezing, rales,rhonchi  No use of accessory muscles of respiration.  CARDIOVASCULAR: S1, S2 normal. No murmurs, rubs, or gallops.  ABDOMEN: Soft, non-tender, non-distended. Bowel sounds present. No organomegaly or mass.  EXTREMITIES: Patient has minimal 1+ pedal edema bilaterally. PSYCHIATRIC: The patient is alert and oriented x 3.  SKIN: No obvious rash, lesion, or ulcer.   DATA REVIEW:   CBC  Recent Labs Lab 04/03/15 0359  WBC 7.3  HGB 8.8*  HCT 27.7*  PLT 346    Chemistries   Recent Labs Lab 04/03/15 0359  NA 129*  K 4.4  CL 86*  CO2 36*  GLUCOSE 100*  BUN 24*  CREATININE 0.75  CALCIUM 8.6*    Cardiac Enzymes  Recent Labs Lab 03/31/15 1608  TROPONINI <0.03    Microbiology Results  @  RADIOLOGY:  No results found.    Management plans discussed with the patient and she is in agreement. Stable for discharge home with home health care  Patient should follow up with Dr. Mariah Milling on Tuesday CODE STATUS:     Code Status Orders        Start     Ordered   03/31/15 1425  Full code   Continuous     03/31/15 1426      TOTAL TIME TAKING CARE OF THIS PATIENT: 37 minutes.    Maize Brittingham M.D on 04/03/2015 at 11:01 AM  Between 7am to 6pm - Pager - [phone] After 6pm go to [email or web address] - password [email or web address] [facility]  [place] [facility]  Office  [phone]  CC: Primary care physician; Marisue Ivan, MD

## 2015-04-04 LAB — BASIC METABOLIC PANEL
ANION GAP: 8 (ref 5–15)
BUN: 21 mg/dL — AB (ref 6–20)
CO2: 36 mmol/L — AB (ref 22–32)
CREATININE: 0.75 mg/dL (ref 0.44–1.00)
Calcium: 8.7 mg/dL — ABNORMAL LOW (ref 8.9–10.3)
Chloride: 84 mmol/L — ABNORMAL LOW (ref 101–111)
GFR calc Af Amer: >60 mL/min (ref >60)
GFR calc non Af Amer: >60 mL/min (ref >60)
GLUCOSE: 94 mg/dL (ref 65–99)
Potassium: 4.6 mmol/L (ref 3.5–5.1)
Sodium: 128 mmol/L — ABNORMAL LOW (ref 135–145)

## 2015-04-04 LAB — BRAIN NATRIURETIC PEPTIDE: B NATRIURETIC PEPTIDE 5: 246 pg/mL — AB (ref 0.0–100.0)

## 2015-04-04 NOTE — Progress Notes (Signed)
   SUBJECTIVE:   SOB improved,  Leg edema much better She feels stronger.    Filed Vitals:   04/03/15 1120 04/03/15 1350 04/03/15 2014 04/04/15 0544  BP: 118/72  113/48 125/44  Pulse: 66  75 68  Temp: 98.1 F (36.7 C)  98.1 F (36.7 C) 97.3 F (36.3 C)  TempSrc: Oral  Oral Oral  Resp: 18  16 18   Height:      Weight:    167 lb 4.8 oz (75.887 kg)  SpO2: 100% 75% 96% 99%    Intake/Output Summary (Last 24 hours) at 04/04/15 0849 Last data filed at 04/04/15 0643  Gross per 24 hour  Intake      0 ml  Output   1950 ml  Net  -1950 ml    LABS: Basic Metabolic Panel:  Recent Labs  36/12/24 0359 04/04/15 0419  NA 129* 128*  K 4.4 4.6  CL 86* 84*  CO2 36* 36*  GLUCOSE 100* 94  BUN 24* 21*  CREATININE 0.75 0.75  CALCIUM 8.6* 8.7*   Liver Function Tests: No results for input(s): AST, ALT, ALKPHOS, BILITOT, PROT, ALBUMIN in the last 72 hours. No results for input(s): LIPASE, AMYLASE in the last 72 hours. CBC:  Recent Labs  04/03/15 0359  WBC 7.3  HGB 8.8*  HCT 27.7*  MCV 82.0  PLT 346   Cardiac Enzymes: No results for input(s): CKTOTAL, CKMB, CKMBINDEX, TROPONINI in the last 72 hours. BNP: Invalid input(s): POCBNP D-Dimer: No results for input(s): DDIMER in the last 72 hours. Hemoglobin A1C: No results for input(s): HGBA1C in the last 72 hours. Fasting Lipid Panel: No results for input(s): CHOL, HDL, LDLCALC, TRIG, CHOLHDL, LDLDIRECT in the last 72 hours. Thyroid Function Tests: No results for input(s): TSH, T4TOTAL, T3FREE, THYROIDAB in the last 72 hours.  Invalid input(s): FREET3 Anemia Panel: No results for input(s): VITAMINB12, FOLATE, FERRITIN, TIBC, IRON, RETICCTPCT in the last 72 hours.   PHYSICAL EXAM General: Well developed, well nourished, in no acute distress HEENT:  Normocephalic and atramatic Neck: Mild JVD  Lungs: clear to auscultation Heart: HRRR . Normal S1 and S2 without gallops or murmurs.  Abdomen: Bowel sounds are positive,  abdomen soft and non-tender  Msk:  Back normal, normal gait. Normal strength and tone for age. Extremities: trace edema around the ankles Neuro: Alert and oriented X 3. Psych:  Good affect, responds appropriately  TELEMETRY: Reviewed telemetry pt in normal sinus rhythm, 76 bpm  ASSESSMENT AND PLAN:  1. Acute on chronic diastolic CHF:  Long hx of acute on chronic diastolic CHF exacerbations, Dietary and medication noncompliance issues Typically had been taking bumex 1 mg daily, though on most recent office visit was not taking any diuretic She has done well on bumex 1 mg po BID Can discharge home on Bumex 1 mg once daily.  2. Essential HTN:  Stable on current meds  3. Anemia  likely contributing to weakness, SOB Previously managed by hematology,  Prior iron infusions, none recently Needs close outpt follow up with hematology (suspect she may require iron infusion)  4. HL:  Intolerant to statins.  4. COPD:  Stable  5. DM II:  Per IM.  6. PAF: None recently on tele CHA2DS2VASc = 8, not on anticoagulation in the setting of severe anemia.   DISPO: Okay to discharge home from a cardiac standpoint.    Lorine Bears, MD [facility]  04/04/2015 8:49 AM

## 2015-04-04 NOTE — Care Management (Signed)
Patient was to have been discharged over the weekend but discharge was cancelled.  Order present for today and [facility] had referral over the weekend.  Notified agency of definite discharge today with [facility] and PT.  Patient verbalizes she may not need this service.  Discussed the benefits.

## 2015-04-04 NOTE — Progress Notes (Signed)
Pt. Discharged to home with [facility] RN and PT. Discharge instructions and medication regimen reviewed at bedside with patient. Pt. verbalizes understanding of instructions and medication regimen. Patient assessment unchanged from this morning. TELE and IV discontinued per policy.

## 2015-04-04 NOTE — Progress Notes (Signed)
[facility] -  at [facility]   PATIENT NAME: Catherine Holmes    MR#:  [id number]  DATE OF BIRTH:  [birth year]  SUBJECTIVE:  Patient ready for d.c home doing well improved LEE  REVIEW OF SYSTEMS:    Review of Systems  Constitutional: Negative for chills, weight loss and diaphoresis.  Respiratory: Negative for sputum production and shortness of breath.   Cardiovascular: Negative for chest pain, palpitations and claudication. Leg swelling: improved.  Gastrointestinal: Negative for nausea, vomiting, abdominal pain, constipation and blood in stool.  Genitourinary: Negative for dysuria and urgency.  Neurological: Negative for tingling, tremors and sensory change.    Tolerating Diet:yes      DRUG ALLERGIES:   Allergies  Allergen Reactions  . Atorvastatin   . Codeine   . Doxycycline   . Epinephrine Hives and Itching  . Erythromycin   . Fluticasone-Salmeterol   . Lasix [Furosemide] Itching  . Levofloxacin   . Penicillins   . Propoxyphene Hcl   . Rosuvastatin   . Simvastatin   . Singlet [Chlorphen-Pe-Acetaminophen] Other (See Comments)    Reaction: Wheezing  . Sulfamethoxazole-Trimethoprim   . Teriparatide   . Cefaclor Rash and Other (See Comments)    Reaction: Delirious    . Glipizide Hives  . Lisinopril Hives and Itching    VITALS:  Blood pressure 125/44, pulse 68, temperature 97.3 F (36.3 C), temperature source Oral, resp. rate 18, height 5' (1.524 m), weight 75.887 kg (167 lb 4.8 oz), SpO2 99 %.  PHYSICAL EXAMINATION:   Physical Exam  Constitutional: She is oriented to person, place, and time.  HENT:  Head: Normocephalic.  Eyes: No scleral icterus.  Neck: Neck supple. No JVD present. No tracheal deviation present.  Cardiovascular: Normal rate and regular rhythm.   No murmur heard. Pulmonary/Chest: Effort normal and breath sounds normal. No respiratory distress. She has no wheezes. She has no rales.  Abdominal: Bowel sounds  are normal. There is no tenderness.  Musculoskeletal: Normal range of motion. She exhibits edema.  Neurological: She is alert and oriented to person, place, and time. No cranial nerve deficit.  Skin: Skin is warm. No rash noted. She is not diaphoretic.  Psychiatric: Memory, affect and judgment normal.      LABORATORY PANEL:   CBC  Recent Labs Lab 04/03/15 0359  WBC 7.3  HGB 8.8*  HCT 27.7*  PLT 346   ------------------------------------------------------------------------------------------------------------------  Chemistries   Recent Labs Lab 04/04/15 0419  NA 128*  K 4.6  CL 84*  CO2 36*  GLUCOSE 94  BUN 21*  CREATININE 0.75  CALCIUM 8.7*   ------------------------------------------------------------------------------------------------------------------  Cardiac Enzymes  Recent Labs Lab 03/31/15 1608  TROPONINI <0.03   ------------------------------------------------------------------------------------------------------------------  RADIOLOGY:  No results found.   ASSESSMENT AND PLAN:    This is an 81 year old female with a history of chronic diastolic heart failure who presented with acute on chronic diastolic heart failure. .  1. Acute on chronic diastolic heart failure: Patient diuresed well.Na still low. Will need follow up tomorrow for repeat na level D/c omn once daily BUMEX.   2. Essential hypertension: Good BP today. Follow up PCP/Cardiology.  3. anemia of chronic disease: Patient's hemoglobin stable. She has had iron infusions in the past.   4. COPD: Patient wears oxygen for her COPD. Stable. She will continue Spiriva and Dulera.  4. Neuropathy: Patient will continue Neurontin. 5. Depression: Patient will continue on Celexa  6. History of Paroxysmal fibrillation: Patient appears to be in normal  sinus rhythm at this time. Patient will continue Bystolic. Patient is high fall risk and therefore not on long-term anticoagulation.   7.  Hyponatremia: Patient's sodium level was 128 and she will see Dr. Mariah Milling onTuesday    All the records are reviewed and case discussed with Care Management/Social Worker. Management plans discussed with the patient and she is in agreement.  CODE STATUS: FULL  TOTAL TIME TAKING CARE OF THIS PATIENT: 33 minutes.   D/C home with HHC  Mollee Neer M.D on 04/04/2015 at 8:51 AM  Between 7am to 6pm - Pager - [phone] After 6pm go to [email or web address] - password [email or web address] [facility]  [facility]  Office  [phone]  CC: Primary care physician; Marisue Ivan, MD

## 2015-04-05 ENCOUNTER — Other Ambulatory Visit (INDEPENDENT_AMBULATORY_CARE_PROVIDER_SITE_OTHER): Payer: Medicare Other | Admitting: *Deleted

## 2015-04-05 DIAGNOSIS — I509 Heart failure, unspecified: Secondary | ICD-10-CM | POA: Diagnosis not present

## 2015-04-06 LAB — BASIC METABOLIC PANEL
BUN / CREAT RATIO: 28 — AB (ref 11–26)
BUN: 19 mg/dL (ref 8–27)
CHLORIDE: 83 mmol/L — AB (ref 97–108)
CO2: 30 mmol/L — ABNORMAL HIGH (ref 18–29)
Calcium: 9.1 mg/dL (ref 8.7–10.3)
Creatinine, Ser: 0.69 mg/dL (ref 0.57–1.00)
GFR calc Af Amer: 92 mL/min/{1.73_m2} (ref >59)
GFR calc non Af Amer: 80 mL/min/{1.73_m2} (ref >59)
Glucose: 134 mg/dL — ABNORMAL HIGH (ref 65–99)
Potassium: 5.2 mmol/L (ref 3.5–5.2)
Sodium: 129 mmol/L — ABNORMAL LOW (ref 134–144)

## 2015-04-10 ENCOUNTER — Encounter: Payer: Self-pay | Admitting: Emergency Medicine

## 2015-04-10 ENCOUNTER — Emergency Department: Payer: Medicare Other

## 2015-04-10 ENCOUNTER — Inpatient Hospital Stay
Admission: EM | Admit: 2015-04-10 | Discharge: 2015-04-13 | DRG: 312 | Disposition: A | Discharge status: Home / Self Care | Payer: Medicare Other | Attending: Internal Medicine | Admitting: Internal Medicine

## 2015-04-10 DIAGNOSIS — E871 Hypo-osmolality and hyponatremia: Secondary | ICD-10-CM | POA: Diagnosis present

## 2015-04-10 DIAGNOSIS — J449 Chronic obstructive pulmonary disease, unspecified: Secondary | ICD-10-CM | POA: Diagnosis present

## 2015-04-10 DIAGNOSIS — Z880 Allergy status to penicillin: Secondary | ICD-10-CM

## 2015-04-10 DIAGNOSIS — D649 Anemia, unspecified: Secondary | ICD-10-CM | POA: Diagnosis present

## 2015-04-10 DIAGNOSIS — K5792 Diverticulitis of intestine, part unspecified, without perforation or abscess without bleeding: Secondary | ICD-10-CM | POA: Diagnosis present

## 2015-04-10 DIAGNOSIS — I5032 Chronic diastolic (congestive) heart failure: Secondary | ICD-10-CM | POA: Diagnosis present

## 2015-04-10 DIAGNOSIS — Z885 Allergy status to narcotic agent status: Secondary | ICD-10-CM

## 2015-04-10 DIAGNOSIS — Z7951 Long term (current) use of inhaled steroids: Secondary | ICD-10-CM

## 2015-04-10 DIAGNOSIS — I248 Other forms of acute ischemic heart disease: Secondary | ICD-10-CM | POA: Diagnosis present

## 2015-04-10 DIAGNOSIS — Z79899 Other long term (current) drug therapy: Secondary | ICD-10-CM

## 2015-04-10 DIAGNOSIS — E785 Hyperlipidemia, unspecified: Secondary | ICD-10-CM | POA: Diagnosis present

## 2015-04-10 DIAGNOSIS — G500 Trigeminal neuralgia: Secondary | ICD-10-CM | POA: Diagnosis present

## 2015-04-10 DIAGNOSIS — I480 Paroxysmal atrial fibrillation: Secondary | ICD-10-CM | POA: Diagnosis present

## 2015-04-10 DIAGNOSIS — Z881 Allergy status to other antibiotic agents status: Secondary | ICD-10-CM

## 2015-04-10 DIAGNOSIS — I10 Essential (primary) hypertension: Secondary | ICD-10-CM | POA: Diagnosis present

## 2015-04-10 DIAGNOSIS — Z87891 Personal history of nicotine dependence: Secondary | ICD-10-CM

## 2015-04-10 DIAGNOSIS — M810 Age-related osteoporosis without current pathological fracture: Secondary | ICD-10-CM | POA: Diagnosis present

## 2015-04-10 DIAGNOSIS — M2550 Pain in unspecified joint: Secondary | ICD-10-CM | POA: Diagnosis present

## 2015-04-10 DIAGNOSIS — Z9114 Patient's other noncompliance with medication regimen: Secondary | ICD-10-CM | POA: Diagnosis present

## 2015-04-10 DIAGNOSIS — E119 Type 2 diabetes mellitus without complications: Secondary | ICD-10-CM | POA: Diagnosis present

## 2015-04-10 DIAGNOSIS — R55 Syncope and collapse: Secondary | ICD-10-CM | POA: Diagnosis not present

## 2015-04-10 DIAGNOSIS — Z888 Allergy status to other drugs, medicaments and biological substances status: Secondary | ICD-10-CM

## 2015-04-10 DIAGNOSIS — Z8673 Personal history of transient ischemic attack (TIA), and cerebral infarction without residual deficits: Secondary | ICD-10-CM

## 2015-04-10 DIAGNOSIS — R0902 Hypoxemia: Secondary | ICD-10-CM | POA: Diagnosis present

## 2015-04-10 DIAGNOSIS — I509 Heart failure, unspecified: Secondary | ICD-10-CM

## 2015-04-10 DIAGNOSIS — K449 Diaphragmatic hernia without obstruction or gangrene: Secondary | ICD-10-CM | POA: Diagnosis present

## 2015-04-10 DIAGNOSIS — Z8701 Personal history of pneumonia (recurrent): Secondary | ICD-10-CM

## 2015-04-10 DIAGNOSIS — Z9981 Dependence on supplemental oxygen: Secondary | ICD-10-CM

## 2015-04-10 DIAGNOSIS — I951 Orthostatic hypotension: Secondary | ICD-10-CM | POA: Diagnosis not present

## 2015-04-10 DIAGNOSIS — Z7982 Long term (current) use of aspirin: Secondary | ICD-10-CM

## 2015-04-10 DIAGNOSIS — I341 Nonrheumatic mitral (valve) prolapse: Secondary | ICD-10-CM | POA: Diagnosis present

## 2015-04-10 DIAGNOSIS — Z9111 Patient's noncompliance with dietary regimen: Secondary | ICD-10-CM | POA: Diagnosis present

## 2015-04-10 DIAGNOSIS — K219 Gastro-esophageal reflux disease without esophagitis: Secondary | ICD-10-CM | POA: Diagnosis present

## 2015-04-10 LAB — BASIC METABOLIC PANEL
Anion gap: 8 (ref 5–15)
BUN: 25 mg/dL — AB (ref 6–20)
CO2: 31 mmol/L (ref 22–32)
CREATININE: 0.99 mg/dL (ref 0.44–1.00)
Calcium: 8.7 mg/dL — ABNORMAL LOW (ref 8.9–10.3)
Chloride: 85 mmol/L — ABNORMAL LOW (ref 101–111)
GFR, EST AFRICAN AMERICAN: 59 mL/min — AB (ref >60)
GFR, EST NON AFRICAN AMERICAN: 51 mL/min — AB (ref >60)
Glucose, Bld: 148 mg/dL — ABNORMAL HIGH (ref 65–99)
Potassium: 5.3 mmol/L — ABNORMAL HIGH (ref 3.5–5.1)
Sodium: 124 mmol/L — ABNORMAL LOW (ref 135–145)

## 2015-04-10 LAB — MAGNESIUM: Magnesium: 1.7 mg/dL (ref 1.7–2.4)

## 2015-04-10 LAB — CBC
HCT: 27.2 % — ABNORMAL LOW (ref 35.0–47.0)
Hemoglobin: 8.8 g/dL — ABNORMAL LOW (ref 12.0–16.0)
MCH: 26.3 pg (ref 26.0–34.0)
MCHC: 32.2 g/dL (ref 32.0–36.0)
MCV: 81.6 fL (ref 80.0–100.0)
PLATELETS: 335 10*3/uL (ref 150–440)
RBC: 3.34 MIL/uL — AB (ref 3.80–5.20)
RDW: 17.4 % — ABNORMAL HIGH (ref 11.5–14.5)
WBC: 9.1 10*3/uL (ref 3.6–11.0)

## 2015-04-10 LAB — BRAIN NATRIURETIC PEPTIDE: B Natriuretic Peptide: 546 pg/mL — ABNORMAL HIGH (ref 0.0–100.0)

## 2015-04-10 LAB — TROPONIN I: TROPONIN I: 0.07 ng/mL — AB (ref <0.031)

## 2015-04-10 MED ORDER — MORPHINE SULFATE 2 MG/ML IJ SOLN: 2.0000 mg | INTRAMUSCULAR | Status: DC | PRN

## 2015-04-10 MED ORDER — HEPARIN SODIUM (PORCINE) 5000 UNIT/ML IJ SOLN
5000.0000 [IU] | Freq: Three times a day (TID) | INTRAMUSCULAR | Status: DC
  Administered 2015-04-11 – 2015-04-12 (×7): 5000 [IU] via SUBCUTANEOUS
  Filled 2015-04-10 (×6): qty 1

## 2015-04-10 MED ORDER — ONDANSETRON HCL 4 MG/2ML IJ SOLN: 4.0000 mg | Freq: Four times a day (QID) | INTRAMUSCULAR | Status: DC | PRN

## 2015-04-10 MED ORDER — SODIUM CHLORIDE 0.9 % IV SOLN
Freq: Once | INTRAVENOUS | Status: AC
Start: 2015-04-10 — End: 2015-04-11
  Administered 2015-04-11: 02:00:00 via INTRAVENOUS

## 2015-04-10 MED ORDER — ALBUTEROL SULFATE (2.5 MG/3ML) 0.083% IN NEBU
INHALATION_SOLUTION | RESPIRATORY_TRACT | Status: AC
  Administered 2015-04-10: 2.5 mg via RESPIRATORY_TRACT
  Filled 2015-04-10: qty 3

## 2015-04-10 MED ORDER — ALBUTEROL SULFATE (2.5 MG/3ML) 0.083% IN NEBU
2.5000 mg | INHALATION_SOLUTION | RESPIRATORY_TRACT | Status: AC
  Administered 2015-04-10: 2.5 mg via RESPIRATORY_TRACT

## 2015-04-10 MED ORDER — ONDANSETRON HCL 4 MG PO TABS: 4.0000 mg | ORAL_TABLET | Freq: Four times a day (QID) | ORAL | Status: DC | PRN

## 2015-04-10 MED ORDER — SODIUM CHLORIDE 0.9 % IJ SOLN
3.0000 mL | Freq: Two times a day (BID) | INTRAMUSCULAR | Status: DC
Start: 2015-04-10 — End: 2015-04-12
  Administered 2015-04-11 (×3): 3 mL via INTRAVENOUS

## 2015-04-10 NOTE — ED Notes (Signed)
Pt presents from home after her daughter called EMS. Pt was in car when she had a syncopal episode. Pt was helped to porch by family and began complaining of chest pressure and SOB. Pt recently dx with COPD, hx CHF, DM, HTN. A-Fib without hx. CBG 120.

## 2015-04-10 NOTE — H&P (Signed)
[facility] - [place] at [facility]   PATIENT NAME: Catherine Holmes    MR#:  [id number]  DATE OF BIRTH:  July 25, 1928   DATE OF ADMISSION:  04/10/2015  PRIMARY CARE PHYSICIAN: Catherine Ivan, MD   REQUESTING/REFERRING PHYSICIAN: Quale  CHIEF COMPLAINT:   Chief Complaint  Patient presents with  . Loss of Consciousness  . Chest Pain    HISTORY OF PRESENT ILLNESS:  Catherine Holmes  is a 81 y.o. female with a known history of diastolic congestive heart failure, paroxysmal atrial fibrillation presenting after syncopal episode. Of note recent admission to [facility] discharge diagnosis acute on chronic diastolic congestive heart failure during that time. She was aggressively diuresed discharged on the same dosage of Bumex. She states that she has been taking her medication regularly now since discharge as opposed to her general noncompliance before. Today while attempting to ambulate from the car to house she felt progressively weak, lightheaded her family was able to assist her to the porch where she was in to sit down. After sitting down she subsequently passed out without head trauma. No postictal state, no witnessed seizure activity, no loss of bowel/bladder function, no chest pain, no palpitations. Of note patient states she has not had any to eat or drink and multiple hours prior to this episode.  PAST MEDICAL HISTORY:   Past Medical History  Diagnosis Date  . Essential hypertension   . Diabetes mellitus type II, controlled   . COPD (chronic obstructive pulmonary disease)   . Osteoporosis   . Carotid arterial disease     a. 2012 Carotid dopplers: 40-59% R ICA, 60-79% L ICA  . Tic douloure[place]   . Arthralgia   . Sjoegren syndrome   . TIA (transient ischemic attack)   . Emphysema   . MVP (mitral valve prolapse)   . Chronic diastolic CHF (congestive heart failure)     a. 08/2011 Echo: EF 65-70%, mild LVH, mod dil LA, mildly dil RA,  mild MR, mild-mod AoV Sclerosis w/o stenosis, mod-sev elev PA pressures, mild TR.  Catherine Holmes Acid reflux   . Diverticulitis   . Anemia   . History of pneumonia   . Syncope and collapse   . Anemia   . Hip fracture, left   . Hyperlipidemia   . Hiatal hernia   . Chest pain     a. 08/2011 MV: EF 74%, no ischemia.  . Noncompliance     a. h/o not taking bumex as Rx.  Catherine Holmes PAF (paroxysmal atrial fibrillation)     a. CHA2DS2VASc = 8 ->No OAC 2/2 falls.    PAST SURGICAL HISTORY:   Past Surgical History  Procedure Laterality Date  . Cholecystectomy  1965  . Abdominal hysterectomy  1964  . Cataract extraction Bilateral   . Hip fracture surgery  2014    left hip    SOCIAL HISTORY:   History  Substance Use Topics  . Smoking status: Former Smoker -- 0.50 packs/day for 50 years    Types: Cigarettes    Quit date: 10/22/2000  . Smokeless tobacco: Never Used  . Alcohol Use: No    FAMILY HISTORY:   Family History  Problem Relation Age of Onset  . Heart attack Mother   . Heart attack Father   . Heart attack Sister     DRUG ALLERGIES:   Allergies  Allergen Reactions  . Atorvastatin   . Codeine   . Doxycycline   . Epinephrine Hives and Itching  .  Erythromycin   . Fluticasone-Salmeterol   . Lasix [Furosemide] Itching  . Levofloxacin   . Penicillins   . Propoxyphene Hcl   . Rosuvastatin   . Simvastatin   . Singlet [Chlorphen-Pe-Acetaminophen] Other (See Comments)    Reaction: Wheezing  . Sulfamethoxazole-Trimethoprim   . Teriparatide   . Cefaclor Rash and Other (See Comments)    Reaction: Delirious    . Glipizide Hives  . Lisinopril Hives and Itching    REVIEW OF SYSTEMS:  REVIEW OF SYSTEMS:  CONSTITUTIONAL: Denies fevers, chills, positive fatigue, weakness.  EYES: Denies blurred vision, double vision, or eye pain.  EARS, NOSE, THROAT: Denies tinnitus, ear pain, hearing loss.  RESPIRATORY: denies cough, shortness of breath, wheezing  CARDIOVASCULAR: Denies chest pain,  palpitations, edema. Positive syncope GASTROINTESTINAL: Denies nausea, vomiting, diarrhea, abdominal pain.  GENITOURINARY: Denies dysuria, hematuria.  ENDOCRINE: Denies nocturia or thyroid problems. HEMATOLOGIC AND LYMPHATIC: Denies easy bruising or bleeding.  SKIN: Denies rash or lesions.  MUSCULOSKELETAL: Denies pain in neck, back, shoulder, knees, hips, or further arthritic symptoms.  NEUROLOGIC: Denies paralysis, paresthesias.  PSYCHIATRIC: Denies anxiety or depressive symptoms. Otherwise full review of systems performed by me is negative.   MEDICATIONS AT HOME:   Prior to Admission medications   Medication Sig Start Date End Date Taking? Authorizing Provider  albuterol (PROAIR HFA) 108 (90 BASE) MCG/ACT inhaler Inhale 2 puffs into the lungs every 6 (six) hours as needed. Patient not taking: Reported on 03/31/2015 01/24/12   Catherine Iba, MD  albuterol (PROVENTIL) (2.5 MG/3ML) 0.083% nebulizer solution Take 2.5 mg by nebulization every 6 (six) hours as needed for wheezing or shortness of breath.     Historical Provider, MD  arformoterol (BROVANA) 15 MCG/2ML NEBU Take 2 mLs (15 mcg total) by nebulization 2 (two) times daily. Patient not taking: Reported on 03/31/2015 12/26/11   Catherine Leash, MD  aspirin EC 81 MG tablet Take 81 mg by mouth daily.    Historical Provider, MD  beta carotene w/minerals (OCUVITE) tablet Take 1 tablet by mouth 2 (two) times daily.    Historical Provider, MD  bumetanide (BUMEX) 1 MG tablet Take 1 tablet (1 mg total) by mouth daily. Take 1 tab daily 04/03/15   Catherine Saran, MD  calcium carbonate (OS-CAL) 600 MG TABS tablet Take 600 mg by mouth daily with breakfast.    Historical Provider, MD  Cholecalciferol (VITAMIN D-3 PO) Take 1,000 Units by mouth.    Historical Provider, MD  citalopram (CELEXA) 20 MG tablet Take 20 mg by mouth daily.    Historical Provider, MD  diphenhydramine-acetaminophen (TYLENOL PM) 25-500 MG TABS Take 1 tablet by mouth at bedtime as  needed.    Historical Provider, MD  erythromycin ophthalmic ointment Place 1 application into both eyes at bedtime.    Historical Provider, MD  ezetimibe (ZETIA) 10 MG tablet Take 1 tablet (10 mg total) by mouth daily. Patient not taking: Reported on 03/31/2015 01/24/12   Catherine Iba, MD  fluticasone (FLONASE) 50 MCG/ACT nasal spray Place 1 spray into both nostrils daily.    Historical Provider, MD  gabapentin (NEURONTIN) 100 MG capsule Take 100 mg by mouth 2 (two) times daily.     Historical Provider, MD  HYDROcodone-acetaminophen (NORCO/VICODIN) 5-325 MG per tablet Take 1 tablet by mouth as needed for severe pain.  10/20/12   Historical Provider, MD  mometasone-formoterol (DULERA) 100-5 MCG/ACT AERO Inhale 2 puffs into the lungs 2 (two) times daily.     Historical Provider, MD  Multiple Vitamin (MULTIVITAMIN) tablet Take 1 tablet by mouth daily.      Historical Provider, MD  nebivolol (BYSTOLIC) 5 MG tablet Take 1 tablet (5 mg total) by mouth daily. 04/03/15   Catherine Saran, MD  nitroGLYCERIN (NITROSTAT) 0.4 MG SL tablet Place 1 tablet (0.4 mg total) under the tongue every 5 (five) minutes as needed for chest pain. 07/08/14 01/07/16  Catherine Iba, MD  potassium chloride (K-DUR,KLOR-CON) 10 MEQ tablet Take 5 mEq by mouth 2 (two) times daily as needed. Patient takes this when she takes Bumex.    Historical Provider, MD  spironolactone (ALDACTONE) 25 MG tablet Take 25 mg by mouth daily.    Historical Provider, MD  tiotropium (SPIRIVA) 18 MCG inhalation capsule Place 1 capsule (18 mcg total) into inhaler and inhale daily. 01/24/12   Catherine Iba, MD  Tiotropium Bromide Monohydrate (SPIRIVA RESPIMAT) 2.5 MCG/ACT AERS Inhale 1 puff into the lungs daily.    Historical Provider, MD  triamcinolone (KENALOG) 0.025 % cream Apply 1 application topically daily as needed (for arms).    Historical Provider, MD  trimethoprim-polymyxin b (POLYTRIM) ophthalmic solution Place 1 drop into both eyes 2 (two) times  daily.    Historical Provider, MD      VITAL SIGNS:  Blood pressure 134/70, pulse 80, temperature 98.2 F (36.8 C), temperature source Oral, resp. rate 24, height 5\' 1"  (1.549 m), weight 168 lb (76.204 kg), SpO2 92 %.  PHYSICAL EXAMINATION:  VITAL SIGNS: Filed Vitals:   04/10/15 2300  BP: 134/70  Pulse: 80  Temp:   Resp: 24   GENERAL:81 y.o.female currently in no acute distress.  HEAD: Normocephalic, atraumatic.  EYES: Pupils equal, round, reactive to light. Extraocular muscles intact. No scleral icterus.  MOUTH: Dry mucosal membrane. Dentition intact. No abscess noted.  EAR, NOSE, THROAT: Clear without exudates. No external lesions.  NECK: Supple. No thyromegaly. No nodules. No JVD.  PULMONARY: Clear to ascultation, without wheeze rails or rhonci. No use of accessory muscles, Good respiratory effort. good air entry bilaterally CHEST: Nontender to palpation.  CARDIOVASCULAR: S1 and S2. Irregular rate and rhythm. No murmurs, rubs, or gallops. 1+ edema. Pedal pulses 2+ bilaterally.  GASTROINTESTINAL: Soft, nontender, nondistended. No masses. Positive bowel sounds. No hepatosplenomegaly.  MUSCULOSKELETAL: No swelling, clubbing, or edema. Range of motion full in all extremities.  NEUROLOGIC: Cranial nerves II through XII are intact. No gross focal neurological deficits. Sensation intact. Reflexes intact.  SKIN: No ulceration, lesions, rashes, or cyanosis. Skin warm and dry. Turgor intact.  PSYCHIATRIC: Mood, affect within normal limits. The patient is awake, alert and oriented x 3. Insight, judgment intact.    LABORATORY PANEL:   CBC  Recent Labs Lab 04/10/15 2021  WBC 9.1  HGB 8.8*  HCT 27.2*  PLT 335   ------------------------------------------------------------------------------------------------------------------  Chemistries   Recent Labs Lab 04/10/15 2021  NA 124*  K 5.3*  CL 85*  CO2 31  GLUCOSE 148*  BUN 25*  CREATININE 0.99  CALCIUM 8.7*    ------------------------------------------------------------------------------------------------------------------  Cardiac Enzymes  Recent Labs Lab 04/10/15 2021  TROPONINI 0.07*   ------------------------------------------------------------------------------------------------------------------  RADIOLOGY:  Dg Chest Portable 1 View  04/10/2015   CLINICAL DATA:  Syncope with chest pressure and shortness of breath  EXAM: PORTABLE CHEST - 1 VIEW  COMPARISON:  March 31, 2015  FINDINGS: There is no edema or consolidation. Heart size and pulmonary vascularity are normal. Much of the stomach is above the diaphragm. No adenopathy. There is an old healed fracture of  the proximal right humerus.  IMPRESSION: Much of the stomach is above the diaphragm. No edema or consolidation. Old healed fracture proximal right humerus.   Electronically Signed   By: Bretta Bang III M.D.   On: 04/10/2015 21:21    EKG:   Orders placed or performed during the hospital encounter of 04/10/15  . ED EKG (<50mins upon arrival to the ED)  . ED EKG (<8mins upon arrival to the ED)  . ED EKG  . ED EKG    IMPRESSION AND PLAN:   81 year old Caucasian female history of paroxysmal atrial fibrillation, diastolic congestive heart failure presenting after syncopal episode. She was recently discharged from the hospital since that time has been compliant with her diuretics.  1. Syncope, unspecified: Likely orthostatic in etiology, check orthostatic vital signs, hold diuretics, gentle IV fluid hydration, placed on telemetry, trend cardiac enzymes 3 as initial mildly elevated 2. Diastolic congestive heart failure, not in acute exacerbation: Hold diuretics as stated above, 3. COPD, not acute exacerbation: Continue home medications including Spiriva and O 4. Paroxysmal atrial fibrillation: Currently rate controlled 5. Venous thromboembolism prophylactic: Heparin subcutaneous    All the records are reviewed and case  discussed with ED provider. Management plans discussed with the patient, family and they are in agreement.  CODE STATUS: Full code  TOTAL TIME TAKING CARE OF THIS PATIENT: 35 minutes.    Gillian Meeuwsen,  Mardi Mainland.D on 04/10/2015 at 11:45 PM  Between 7am to 6pm - Pager - [phone]  After 6pm: House Pager: - [phone]  [facility]  Office  [phone]  CC: Primary care physician; Catherine Ivan, MD

## 2015-04-10 NOTE — ED Provider Notes (Signed)
[facility] Emergency Department Provider Note  ____________________________________________  Time seen: Approximately 10:18 PM  I have reviewed the triage vital signs and the nursing notes.   HISTORY  Chief Complaint Loss of Consciousness and Chest Pain    HPI Catherine Holmes is a 81 y.o. female drawn was found in the car when she passed out. Family reports she was going "in and out of consciousness. This episode lasted about 5 minutes per patient. She has some chest pain afterwards, but this is resolved now.  She was recently admitted to hospital. She describes a chest tightness her chest. It is nonradiating. No abdominal pain. No radiation. No fevers or chills. She was feeling well until episode occurred today or she felt very lightheaded and passed out. She denies any injury. No neck pain.  Arrival to the ER the patient was noted to be hypoxic to 78% and hypotensive.However, patient is supposed to be on 2 L when placed on home O2 setting her occipital improve 97%.   Past Medical History  Diagnosis Date  . Essential hypertension   . Diabetes mellitus type II, controlled   . COPD (chronic obstructive pulmonary disease)   . Osteoporosis   . Carotid arterial disease     a. 2012 Carotid dopplers: 40-59% R ICA, 60-79% L ICA  . Tic douloureux   . Arthralgia   . Sjoegren syndrome   . TIA (transient ischemic attack)   . Emphysema   . MVP (mitral valve prolapse)   . Chronic diastolic CHF (congestive heart failure)     a. 08/2011 Echo: EF 65-70%, mild LVH, mod dil LA, mildly dil RA, mild MR, mild-mod AoV Sclerosis w/o stenosis, mod-sev elev PA pressures, mild TR.  Marland Kitchen Acid reflux   . Diverticulitis   . Anemia   . History of pneumonia   . Syncope and collapse   . Anemia   . Hip fracture, left   . Hyperlipidemia   . Hiatal hernia   . Chest pain     a. 08/2011 MV: EF 74%, no ischemia.  . Noncompliance     a. h/o not taking bumex as Rx.  Marland Kitchen PAF (paroxysmal  atrial fibrillation)     a. CHA2DS2VASc = 8 ->No OAC 2/2 falls.    Patient Active Problem List   Diagnosis Date Noted  . Absolute anemia   . Acute on chronic diastolic CHF (congestive heart failure)   . CHF (congestive heart failure) 03/31/2015  . Essential hypertension   . Diabetes mellitus type II, controlled   . Hyposmolality and/or hyponatremia 12/04/2013  . Anemia 12/04/2013  . Chronic diastolic CHF (congestive heart failure) 04/05/2013  . Positive fecal occult blood test 04/05/2013  . Fall 11/20/2012  . Chest tightness 07/13/2011  . Diabetes type 2, uncontrolled 11/29/2009  . Hyperlipidemia 11/29/2009  . TIC DOULOUREUX 11/29/2009  . HYPERTENSION, BENIGN 11/29/2009  . MITRAL VALVE PROLAPSE 11/29/2009  . Congestive heart failure 11/29/2009  . Carotid arterial disease 11/29/2009  . TIA 11/29/2009  . PAD (peripheral artery disease) 11/29/2009  . EMPHYSEMA 11/29/2009  . COPD (chronic obstructive pulmonary disease) 11/29/2009  . GERD 11/29/2009  . SJOGREN'S SYNDROME 11/29/2009  . ARTHRALGIA 11/29/2009  . OSTEOPOROSIS 11/29/2009  . DYSPNEA 11/29/2009    Past Surgical History  Procedure Laterality Date  . Cholecystectomy  1965  . Abdominal hysterectomy  1964  . Cataract extraction Bilateral   . Hip fracture surgery  2014    left hip    Current Outpatient Rx  Name  Route  Sig  Dispense  Refill  . albuterol (PROAIR HFA) 108 (90 BASE) MCG/ACT inhaler   Inhalation   Inhale 2 puffs into the lungs every 6 (six) hours as needed. Patient not taking: Reported on 03/31/2015   3 Inhaler   0     For further refills please refill with your primar ...   . albuterol (PROVENTIL) (2.5 MG/3ML) 0.083% nebulizer solution   Nebulization   Take 2.5 mg by nebulization every 6 (six) hours as needed for wheezing or shortness of breath.          Marland Kitchen arformoterol (BROVANA) 15 MCG/2ML NEBU   Nebulization   Take 2 mLs (15 mcg total) by nebulization 2 (two) times daily. Patient not  taking: Reported on 03/31/2015   120 mL   4   . aspirin EC 81 MG tablet   Oral   Take 81 mg by mouth daily.         . beta carotene w/minerals (OCUVITE) tablet   Oral   Take 1 tablet by mouth 2 (two) times daily.         . bumetanide (BUMEX) 1 MG tablet   Oral   Take 1 tablet (1 mg total) by mouth daily. Take 1 tab daily   180 tablet   3   . calcium carbonate (OS-CAL) 600 MG TABS tablet   Oral   Take 600 mg by mouth daily with breakfast.         . Cholecalciferol (VITAMIN D-3 PO)   Oral   Take 1,000 Units by mouth.         . citalopram (CELEXA) 20 MG tablet   Oral   Take 20 mg by mouth daily.         . diphenhydramine-acetaminophen (TYLENOL PM) 25-500 MG TABS   Oral   Take 1 tablet by mouth at bedtime as needed.         Marland Kitchen erythromycin ophthalmic ointment   Both Eyes   Place 1 application into both eyes at bedtime.         Marland Kitchen ezetimibe (ZETIA) 10 MG tablet   Oral   Take 1 tablet (10 mg total) by mouth daily. Patient not taking: Reported on 03/31/2015   90 tablet   3   . fluticasone (FLONASE) 50 MCG/ACT nasal spray   Each Nare   Place 1 spray into both nostrils daily.         Marland Kitchen gabapentin (NEURONTIN) 100 MG capsule   Oral   Take 100 mg by mouth 2 (two) times daily.          Marland Kitchen HYDROcodone-acetaminophen (NORCO/VICODIN) 5-325 MG per tablet   Oral   Take 1 tablet by mouth as needed for severe pain.          . mometasone-formoterol (DULERA) 100-5 MCG/ACT AERO   Inhalation   Inhale 2 puffs into the lungs 2 (two) times daily.          . Multiple Vitamin (MULTIVITAMIN) tablet   Oral   Take 1 tablet by mouth daily.           . nebivolol (BYSTOLIC) 5 MG tablet   Oral   Take 1 tablet (5 mg total) by mouth daily.   30 tablet   0   . nitroGLYCERIN (NITROSTAT) 0.4 MG SL tablet   Sublingual   Place 1 tablet (0.4 mg total) under the tongue every 5 (five) minutes as needed for chest pain.  25 tablet   6   . potassium chloride  (K-DUR,KLOR-CON) 10 MEQ tablet   Oral   Take 5 mEq by mouth 2 (two) times daily as needed. Patient takes this when she takes Bumex.         Marland Kitchen spironolactone (ALDACTONE) 25 MG tablet   Oral   Take 25 mg by mouth daily.         Marland Kitchen tiotropium (SPIRIVA) 18 MCG inhalation capsule   Inhalation   Place 1 capsule (18 mcg total) into inhaler and inhale daily.   90 capsule   0     For further refills please refill with your primar ...   . Tiotropium Bromide Monohydrate (SPIRIVA RESPIMAT) 2.5 MCG/ACT AERS   Inhalation   Inhale 1 puff into the lungs daily.         Marland Kitchen triamcinolone (KENALOG) 0.025 % cream   Topical   Apply 1 application topically daily as needed (for arms).         . trimethoprim-polymyxin b (POLYTRIM) ophthalmic solution   Both Eyes   Place 1 drop into both eyes 2 (two) times daily.           Allergies Atorvastatin; Codeine; Doxycycline; Epinephrine; Erythromycin; Fluticasone-salmeterol; Lasix; Levofloxacin; Penicillins; Propoxyphene hcl; Rosuvastatin; Simvastatin; Singlet; Sulfamethoxazole-trimethoprim; Teriparatide; Cefaclor; Glipizide; and Lisinopril  Family History  Problem Relation Age of Onset  . Heart attack Mother   . Heart attack Father   . Heart attack Sister     Social History History  Substance Use Topics  . Smoking status: Former Smoker -- 0.50 packs/day for 50 years    Types: Cigarettes    Quit date: 10/22/2000  . Smokeless tobacco: Never Used  . Alcohol Use: No    Review of Systems Constitutional: No fever/chills Eyes: No visual changes. ENT: No sore throat. Cardiovascular: See history of present illness Respiratory: Did feel briefly short of breath, this is improved. Gastrointestinal: No abdominal pain.  No nausea, no vomiting.  No diarrhea.  No constipation. Genitourinary: Negative for dysuria. Musculoskeletal: Negative for back pain. Skin: Negative for rash. Neurological: Negative for headaches, focal weakness or  numbness.  10-point ROS otherwise negative.  ____________________________________________   PHYSICAL EXAM:  VITAL SIGNS: ED Triage Vitals  Enc Vitals Group     BP 04/10/15 2017 82/70 mmHg     Pulse Rate 04/10/15 2017 71     Resp 04/10/15 1952 20     Temp 04/10/15 1952 98.2 F (36.8 C)     Temp Source 04/10/15 1952 Oral     SpO2 04/10/15 2017 78 %     Weight 04/10/15 2017 168 lb (76.204 kg)     Height 04/10/15 2017 5\' 1"  (1.549 m)     Head Cir --      Peak Flow --      Pain Score 04/10/15 2015 2     Pain Loc --      Pain Edu? --      Excl. in GC? --     Constitutional: Alert and oriented. Well appearing and in no acute distress. Eyes: Conjunctivae are normal. PERRL. EOMI. Head: Atraumatic. Nose: No congestion/rhinnorhea. Mouth/Throat: Mucous membranes are moist.  Oropharynx non-erythematous. Neck: No stridor.   Cardiovascular: Normal rate, regular rhythm. Grossly normal heart sounds.  Good peripheral circulation. Respiratory: Normal respiratory effort.  No retractions. Lungs CTAB. Scant wheezes in the bases bilaterally. Gastrointestinal: Soft and nontender. No distention. No abdominal bruits. No CVA tenderness. Musculoskeletal: No lower extremity tenderness nor edema.  No joint effusions. Neurologic:  Normal speech and language. No gross focal neurologic deficits are appreciated. Speech is normal. No gait instability. Skin:  Skin is warm, dry and intact. No rash noted. Psychiatric: Mood and affect are normal. Speech and behavior are normal.  ____________________________________________   LABS (all labs ordered are listed, but only abnormal results are displayed)  Labs Reviewed  CBC - Abnormal; Notable for the following:    RBC 3.34 (*)    Hemoglobin 8.8 (*)    HCT 27.2 (*)    RDW 17.4 (*)    All other components within normal limits  BASIC METABOLIC PANEL - Abnormal; Notable for the following:    Sodium 124 (*)    Potassium 5.3 (*)    Chloride 85 (*)     Glucose, Bld 148 (*)    BUN 25 (*)    Calcium 8.7 (*)    GFR calc non Af Amer 51 (*)    GFR calc Af Amer 59 (*)    All other components within normal limits  BRAIN NATRIURETIC PEPTIDE - Abnormal; Notable for the following:    B Natriuretic Peptide 546.0 (*)    All other components within normal limits  TROPONIN I - Abnormal; Notable for the following:    Troponin I 0.07 (*)    All other components within normal limits   ____________________________________________  EKG  ED ECG REPORT I, QUALE, MARK, the attending physician, personally viewed and interpreted this ECG.  Date: 04/10/2015 EKG Time: 2111 Rate: 70 Rhythm: normal sinus rhythm QRS Axis: normal Intervals: normal ST/T Wave abnormalities: normal Conduction Disutrbances: Anterior Q's Narrative Interpretation: Normal sinus rhythm, normal T waves, Q waves anterior no evidence of acute ischemic change.  Please note initial EKG had significant artifact, which the computer was overreading as ST elevation MI. I disagree with that reading. The believe that the computer is overreading artifact. ____________________________________________  RADIOLOGY  DG Chest Portable 1 View (Final result) Result time: 04/10/15 21:21:27   Final result by Rad Results In Interface (04/10/15 21:21:27)   Narrative:   CLINICAL DATA: Syncope with chest pressure and shortness of breath  EXAM: PORTABLE CHEST - 1 VIEW  COMPARISON: March 31, 2015  FINDINGS: There is no edema or consolidation. Heart size and pulmonary vascularity are normal. Much of the stomach is above the diaphragm. No adenopathy. There is an old healed fracture of the proximal right humerus.  IMPRESSION: Much of the stomach is above the diaphragm. No edema or consolidation. Old healed fracture proximal right humerus.    ____________________________________________   PROCEDURES  Procedure(s) performed: None  Critical Care performed:  No  ____________________________________________   INITIAL IMPRESSION / ASSESSMENT AND PLAN / ED COURSE  Pertinent labs & imaging results that were available during my care of the patient were reviewed by me and considered in my medical decision making (see chart for details).  She presents with syncopal episode. This was in the setting of chest tightness, which has resolved. After being placed back on home oxygen her O2 sat returned to normal. Her mental status was alert and oriented throughout. She was briefly hypotensive, this resolved. There is a little unclear as to what exactly happened. Etiology could be due to arrhythmia, medications, vasovagal, etc. We will work her up including cardiac workup, remain on telemetry, continue to monitor vitals. Fact that her mental status is normal and that she feels much improved in the ER is very reassuring. No evidence of acute MI. First troponin is slightly  elevated at 0.07. ____________________________________________   FINAL CLINICAL IMPRESSION(S) / ED DIAGNOSES  Final diagnoses:  Syncope and collapse      ----------------------------------------- 9:31 PM on 04/10/2015 -----------------------------------------  Since blood pressure is normalized. Patient is awake alert with no complaints at the present time. Discussed with her and her daughter, she states that she feels well. No headache, no chest pain, no shortness of breath at present time. Daughter does affirm the patient was going in and out of consciousness briefly and did have a single episode today though.  ----------------------------------------- 10:18 PM on 04/10/2015 -----------------------------------------  Patient awake alert no distress. Amicable and speaking with me. She denies any pain. No shortness of breath. She feels well at this time. Discussed with the patient admission because of her syncope, cardiac history, and hypotension. Patient and family are agreeable with  plan. Discussed with Dr. Clint Guy of the admission service.  Sharyn Creamer, MD 04/10/15 2227

## 2015-04-10 NOTE — ED Notes (Signed)
Called to room by patient who states she thinks she is dying. Pt repositioned, TV turned on. Immediate decrease in pt anxiety level.

## 2015-04-11 DIAGNOSIS — I248 Other forms of acute ischemic heart disease: Secondary | ICD-10-CM | POA: Diagnosis present

## 2015-04-11 DIAGNOSIS — E785 Hyperlipidemia, unspecified: Secondary | ICD-10-CM | POA: Diagnosis present

## 2015-04-11 DIAGNOSIS — K219 Gastro-esophageal reflux disease without esophagitis: Secondary | ICD-10-CM | POA: Diagnosis present

## 2015-04-11 DIAGNOSIS — Z9981 Dependence on supplemental oxygen: Secondary | ICD-10-CM | POA: Diagnosis not present

## 2015-04-11 DIAGNOSIS — M2550 Pain in unspecified joint: Secondary | ICD-10-CM | POA: Diagnosis present

## 2015-04-11 DIAGNOSIS — I5033 Acute on chronic diastolic (congestive) heart failure: Secondary | ICD-10-CM | POA: Diagnosis not present

## 2015-04-11 DIAGNOSIS — Z8673 Personal history of transient ischemic attack (TIA), and cerebral infarction without residual deficits: Secondary | ICD-10-CM | POA: Diagnosis not present

## 2015-04-11 DIAGNOSIS — D649 Anemia, unspecified: Secondary | ICD-10-CM | POA: Diagnosis present

## 2015-04-11 DIAGNOSIS — G500 Trigeminal neuralgia: Secondary | ICD-10-CM | POA: Diagnosis present

## 2015-04-11 DIAGNOSIS — E871 Hypo-osmolality and hyponatremia: Secondary | ICD-10-CM | POA: Diagnosis present

## 2015-04-11 DIAGNOSIS — I951 Orthostatic hypotension: Secondary | ICD-10-CM | POA: Diagnosis present

## 2015-04-11 DIAGNOSIS — K5792 Diverticulitis of intestine, part unspecified, without perforation or abscess without bleeding: Secondary | ICD-10-CM | POA: Diagnosis present

## 2015-04-11 DIAGNOSIS — Z881 Allergy status to other antibiotic agents status: Secondary | ICD-10-CM | POA: Diagnosis not present

## 2015-04-11 DIAGNOSIS — Z880 Allergy status to penicillin: Secondary | ICD-10-CM | POA: Diagnosis not present

## 2015-04-11 DIAGNOSIS — Z79899 Other long term (current) drug therapy: Secondary | ICD-10-CM | POA: Diagnosis not present

## 2015-04-11 DIAGNOSIS — I10 Essential (primary) hypertension: Secondary | ICD-10-CM | POA: Diagnosis present

## 2015-04-11 DIAGNOSIS — K449 Diaphragmatic hernia without obstruction or gangrene: Secondary | ICD-10-CM | POA: Diagnosis present

## 2015-04-11 DIAGNOSIS — I5032 Chronic diastolic (congestive) heart failure: Secondary | ICD-10-CM | POA: Diagnosis present

## 2015-04-11 DIAGNOSIS — I341 Nonrheumatic mitral (valve) prolapse: Secondary | ICD-10-CM | POA: Diagnosis present

## 2015-04-11 DIAGNOSIS — Z885 Allergy status to narcotic agent status: Secondary | ICD-10-CM | POA: Diagnosis not present

## 2015-04-11 DIAGNOSIS — R0902 Hypoxemia: Secondary | ICD-10-CM | POA: Diagnosis present

## 2015-04-11 DIAGNOSIS — E119 Type 2 diabetes mellitus without complications: Secondary | ICD-10-CM | POA: Diagnosis present

## 2015-04-11 DIAGNOSIS — J449 Chronic obstructive pulmonary disease, unspecified: Secondary | ICD-10-CM | POA: Diagnosis present

## 2015-04-11 DIAGNOSIS — M810 Age-related osteoporosis without current pathological fracture: Secondary | ICD-10-CM | POA: Diagnosis present

## 2015-04-11 DIAGNOSIS — I480 Paroxysmal atrial fibrillation: Secondary | ICD-10-CM | POA: Diagnosis present

## 2015-04-11 DIAGNOSIS — Z9111 Patient's noncompliance with dietary regimen: Secondary | ICD-10-CM | POA: Diagnosis present

## 2015-04-11 DIAGNOSIS — Z7982 Long term (current) use of aspirin: Secondary | ICD-10-CM | POA: Diagnosis not present

## 2015-04-11 DIAGNOSIS — Z7951 Long term (current) use of inhaled steroids: Secondary | ICD-10-CM | POA: Diagnosis not present

## 2015-04-11 DIAGNOSIS — Z8701 Personal history of pneumonia (recurrent): Secondary | ICD-10-CM | POA: Diagnosis not present

## 2015-04-11 DIAGNOSIS — R55 Syncope and collapse: Secondary | ICD-10-CM | POA: Diagnosis present

## 2015-04-11 DIAGNOSIS — Z888 Allergy status to other drugs, medicaments and biological substances status: Secondary | ICD-10-CM | POA: Diagnosis not present

## 2015-04-11 DIAGNOSIS — Z9114 Patient's other noncompliance with medication regimen: Secondary | ICD-10-CM | POA: Diagnosis present

## 2015-04-11 DIAGNOSIS — Z87891 Personal history of nicotine dependence: Secondary | ICD-10-CM | POA: Diagnosis not present

## 2015-04-11 LAB — TROPONIN I
TROPONIN I: 0.06 ng/mL — AB (ref <0.031)
TROPONIN I: 0.07 ng/mL — AB (ref <0.031)
Troponin I: 0.07 ng/mL — ABNORMAL HIGH (ref <0.031)

## 2015-04-11 LAB — BASIC METABOLIC PANEL
ANION GAP: 5 (ref 5–15)
BUN: 20 mg/dL (ref 6–20)
CHLORIDE: 91 mmol/L — AB (ref 101–111)
CO2: 32 mmol/L (ref 22–32)
Calcium: 8.7 mg/dL — ABNORMAL LOW (ref 8.9–10.3)
Creatinine, Ser: 0.69 mg/dL (ref 0.44–1.00)
GFR calc Af Amer: >60 mL/min (ref >60)
GFR calc non Af Amer: >60 mL/min (ref >60)
Glucose, Bld: 153 mg/dL — ABNORMAL HIGH (ref 65–99)
Potassium: 5.2 mmol/L — ABNORMAL HIGH (ref 3.5–5.1)
SODIUM: 128 mmol/L — AB (ref 135–145)

## 2015-04-11 MED ORDER — NEBIVOLOL HCL 5 MG PO TABS
5.0000 mg | ORAL_TABLET | Freq: Every day | ORAL | Status: DC
  Administered 2015-04-12 – 2015-04-13 (×2): 5 mg via ORAL
  Filled 2015-04-11: qty 1

## 2015-04-11 MED ORDER — GI COCKTAIL ~~LOC~~
30.0000 mL | Freq: Once | ORAL | Status: AC
  Administered 2015-04-11: 30 mL via ORAL

## 2015-04-11 MED ORDER — CITALOPRAM HYDROBROMIDE 20 MG PO TABS
20.0000 mg | ORAL_TABLET | Freq: Every day | ORAL | Status: DC
  Administered 2015-04-12 – 2015-04-13 (×2): 20 mg via ORAL
  Filled 2015-04-11: qty 1

## 2015-04-11 MED ORDER — OCUVITE-LUTEIN PO CAPS
1.0000 | ORAL_CAPSULE | Freq: Two times a day (BID) | ORAL | Status: DC
  Administered 2015-04-11 – 2015-04-13 (×3): 1 via ORAL
  Filled 2015-04-11 (×5): qty 1

## 2015-04-11 MED ORDER — GABAPENTIN 100 MG PO CAPS
100.0000 mg | ORAL_CAPSULE | Freq: Two times a day (BID) | ORAL | Status: DC
  Administered 2015-04-11 – 2015-04-13 (×4): 100 mg via ORAL
  Filled 2015-04-11: qty 1

## 2015-04-11 MED ORDER — ACETAMINOPHEN 500 MG PO TABS: 500.0000 mg | ORAL_TABLET | Freq: Four times a day (QID) | ORAL | Status: DC | PRN

## 2015-04-11 MED ORDER — GABAPENTIN 100 MG PO CAPS
100.0000 mg | ORAL_CAPSULE | Freq: Two times a day (BID) | ORAL | Status: DC
  Administered 2015-04-11 (×2): 100 mg via ORAL
  Filled 2015-04-11 (×2): qty 1

## 2015-04-11 MED ORDER — ERYTHROMYCIN 5 MG/GM OP OINT
1.0000 "application " | TOPICAL_OINTMENT | Freq: Every day | OPHTHALMIC | Status: DC
  Administered 2015-04-11 – 2015-04-12 (×2): 1 via OPHTHALMIC
  Filled 2015-04-11: qty 3.5

## 2015-04-11 MED ORDER — CITALOPRAM HYDROBROMIDE 20 MG PO TABS
20.0000 mg | ORAL_TABLET | Freq: Every day | ORAL | Status: DC
  Administered 2015-04-11: 20 mg via ORAL
  Filled 2015-04-11: qty 1

## 2015-04-11 MED ORDER — HEPARIN SODIUM (PORCINE) 5000 UNIT/ML IJ SOLN
INTRAMUSCULAR | Status: AC
  Administered 2015-04-11: 5000 [IU] via SUBCUTANEOUS
  Filled 2015-04-11: qty 1

## 2015-04-11 MED ORDER — ALUM & MAG HYDROXIDE-SIMETH 200-200-20 MG/5ML PO SUSP
30.0000 mL | Freq: Four times a day (QID) | ORAL | Status: DC | PRN
  Administered 2015-04-11: 30 mL via ORAL
  Filled 2015-04-11: qty 30

## 2015-04-11 MED ORDER — POLYMYXIN B-TRIMETHOPRIM 10000-0.1 UNIT/ML-% OP SOLN
1.0000 [drp] | Freq: Two times a day (BID) | OPHTHALMIC | Status: DC
  Administered 2015-04-11: 1 [drp] via OPHTHALMIC
  Filled 2015-04-11: qty 10

## 2015-04-11 MED ORDER — TIOTROPIUM BROMIDE MONOHYDRATE 2.5 MCG/ACT IN AERS: 1.0000 | INHALATION_SPRAY | Freq: Every day | RESPIRATORY_TRACT | Status: DC

## 2015-04-11 MED ORDER — ASPIRIN EC 81 MG PO TBEC
81.0000 mg | DELAYED_RELEASE_TABLET | Freq: Every day | ORAL | Status: DC
  Administered 2015-04-12 – 2015-04-13 (×2): 81 mg via ORAL
  Filled 2015-04-11: qty 1

## 2015-04-11 MED ORDER — HYDROCODONE-ACETAMINOPHEN 5-325 MG PO TABS: 1.0000 | ORAL_TABLET | ORAL | Status: DC | PRN

## 2015-04-11 MED ORDER — TIOTROPIUM BROMIDE MONOHYDRATE 18 MCG IN CAPS
18.0000 ug | ORAL_CAPSULE | Freq: Every day | RESPIRATORY_TRACT | Status: DC
  Filled 2015-04-11: qty 5

## 2015-04-11 MED ORDER — TIOTROPIUM BROMIDE MONOHYDRATE 18 MCG IN CAPS
18.0000 ug | ORAL_CAPSULE | Freq: Every day | RESPIRATORY_TRACT | Status: DC
  Administered 2015-04-11: 18 ug via RESPIRATORY_TRACT
  Filled 2015-04-11: qty 5

## 2015-04-11 MED ORDER — DIPHENHYDRAMINE-APAP (SLEEP) 25-500 MG PO TABS
1.0000 | ORAL_TABLET | Freq: Every evening | ORAL | Status: DC | PRN
  Administered 2015-04-11 – 2015-04-12 (×3): 1 via ORAL
  Filled 2015-04-11: qty 1

## 2015-04-11 MED ORDER — HYDROCODONE-ACETAMINOPHEN 5-325 MG PO TABS
1.0000 | ORAL_TABLET | ORAL | Status: DC | PRN
  Filled 2015-04-11: qty 1

## 2015-04-11 MED ORDER — ASPIRIN EC 81 MG PO TBEC
81.0000 mg | DELAYED_RELEASE_TABLET | Freq: Every day | ORAL | Status: DC
  Administered 2015-04-11: 81 mg via ORAL
  Filled 2015-04-11: qty 1

## 2015-04-11 MED ORDER — NITROGLYCERIN 0.4 MG SL SUBL: 0.4000 mg | SUBLINGUAL_TABLET | SUBLINGUAL | Status: DC | PRN

## 2015-04-11 MED ORDER — NEBIVOLOL HCL 5 MG PO TABS
5.0000 mg | ORAL_TABLET | Freq: Every day | ORAL | Status: DC
  Administered 2015-04-11: 5 mg via ORAL
  Filled 2015-04-11: qty 1

## 2015-04-11 MED ORDER — MOMETASONE FURO-FORMOTEROL FUM 100-5 MCG/ACT IN AERO
2.0000 | INHALATION_SPRAY | Freq: Two times a day (BID) | RESPIRATORY_TRACT | Status: DC
  Administered 2015-04-11 – 2015-04-13 (×4): 2 via RESPIRATORY_TRACT
  Filled 2015-04-11: qty 8.8

## 2015-04-11 MED ORDER — FLUTICASONE PROPIONATE 50 MCG/ACT NA SUSP
1.0000 | Freq: Every day | NASAL | Status: DC
  Administered 2015-04-11 – 2015-04-13 (×3): 1 via NASAL
  Filled 2015-04-11: qty 16

## 2015-04-11 MED ORDER — ERYTHROMYCIN 5 MG/GM OP OINT
1.0000 | TOPICAL_OINTMENT | Freq: Every day | OPHTHALMIC | Status: DC
Start: 2015-04-11 — End: 2015-04-11
  Filled 2015-04-11: qty 3.5

## 2015-04-11 MED ORDER — SPIRONOLACTONE 25 MG PO TABS: 25.0000 mg | ORAL_TABLET | Freq: Every day | ORAL | Status: DC

## 2015-04-11 MED ORDER — TIOTROPIUM BROMIDE MONOHYDRATE 2.5 MCG/ACT IN AERS
1.0000 | INHALATION_SPRAY | Freq: Every day | RESPIRATORY_TRACT | Status: DC
  Administered 2015-04-12 – 2015-04-13 (×2): 1 via RESPIRATORY_TRACT
  Filled 2015-04-11 (×2): qty 1

## 2015-04-11 MED ORDER — DIPHENHYDRAMINE HCL 25 MG PO CAPS: 25.0000 mg | ORAL_CAPSULE | Freq: Once | ORAL | Status: DC

## 2015-04-11 MED ORDER — MOMETASONE FURO-FORMOTEROL FUM 100-5 MCG/ACT IN AERO
2.0000 | INHALATION_SPRAY | Freq: Two times a day (BID) | RESPIRATORY_TRACT | Status: DC
Start: 2015-04-11 — End: 2015-04-11
  Administered 2015-04-11 (×2): 2 via RESPIRATORY_TRACT
  Filled 2015-04-11: qty 8.8

## 2015-04-11 MED ORDER — GI COCKTAIL ~~LOC~~
ORAL | Status: AC
  Administered 2015-04-11: 30 mL via ORAL
  Filled 2015-04-11: qty 30

## 2015-04-11 NOTE — Progress Notes (Signed)
Daughter brought home meds- Home meds were sent down to pharmacy to be vertified- form given to pharmacy, daughter, and in chart. Meds are to be given back to pt on discharge.

## 2015-04-11 NOTE — Progress Notes (Signed)
VSS. Up to chair and tolerated it well. Pt has not reported any pain. 3 L of oxygen. NSR. Takes meds ok. Family at the bedside. Sacral red but blanchable. Trop slightly elevated. Na 124. Pt has no further concerns at this time.

## 2015-04-11 NOTE — Care Management (Signed)
Patient admitted under observation.She is currently followed by [facility] and possibly PT (if she agreed to have physical therapy last discharge).

## 2015-04-11 NOTE — Care Management (Signed)
Informed by [facility] that patient declined home health visit service when contact was made at last discharge because was going to start pulmonary rehab at the end of the month.  Patient "does not remember that."

## 2015-04-11 NOTE — Care Management (Addendum)
Medicare observation given and reviewed with patient.  Patient refused to sign. Copy send to [facility] records

## 2015-04-11 NOTE — Progress Notes (Signed)
[facility] - [place] at [facility]   PATIENT NAME: Catherine Holmes    MR#:  [id number]  DATE OF BIRTH:  1927/12/10  SUBJECTIVE:  CHIEF COMPLAINT:   Chief Complaint  Patient presents with  . Loss of Consciousness  . Chest Pain  - admitted for syncope and hypotension - likely over diuresis as outpatient - low sodium as well  REVIEW OF SYSTEMS:  Review of Systems  Constitutional: Negative for fever and chills.       Tiredness  Respiratory: Negative for cough, shortness of breath and wheezing.   Cardiovascular: Negative for chest pain and palpitations.  Gastrointestinal: Negative for nausea, vomiting, abdominal pain, diarrhea and constipation.  Genitourinary: Negative for dysuria.  Neurological: Negative for dizziness, seizures and headaches.    DRUG ALLERGIES:   Allergies  Allergen Reactions  . Atorvastatin   . Codeine   . Doxycycline   . Epinephrine Hives and Itching  . Erythromycin   . Fluticasone-Salmeterol   . Lasix [Furosemide] Itching  . Levofloxacin   . Penicillins   . Propoxyphene Hcl   . Rosuvastatin   . Simvastatin   . Singlet [Chlorphen-Pe-Acetaminophen] Other (See Comments)    Reaction: Wheezing  . Sulfamethoxazole-Trimethoprim   . Teriparatide   . Cefaclor Rash and Other (See Comments)    Reaction: Delirious    . Glipizide Hives  . Lisinopril Hives and Itching    VITALS:  Blood pressure 140/59, pulse 63, temperature 97.9 F (36.6 C), temperature source Oral, resp. rate 19, height 5\' 1"  (1.549 m), weight 76.975 kg (169 lb 11.2 oz), SpO2 96 %.  PHYSICAL EXAMINATION:  Physical Exam  GENERAL:  81 y.o.-year-old patient lying in the bed with no acute distress.  EYES: Pupils equal, round, reactive to light and accommodation. No scleral icterus. Extraocular muscles intact.  HEENT: Head atraumatic, normocephalic. Oropharynx and nasopharynx clear.  NECK:  Supple, no jugular venous distention. No thyroid enlargement, no  tenderness.  LUNGS: Normal breath sounds bilaterally, no wheezing, rales,rhonchi or crepitation. No use of accessory muscles of respiration.  CARDIOVASCULAR: S1, S2 normal. No murmurs, rubs, or gallops.  ABDOMEN: Soft, nontender, nondistended. Bowel sounds present. No organomegaly or mass.  EXTREMITIES: No pedal edema, cyanosis, or clubbing.  NEUROLOGIC: Cranial nerves II through XII are intact. Muscle strength 5/5 in all extremities. Sensation intact. Gait not checked.  PSYCHIATRIC: The patient is alert and oriented x 3.  SKIN: No obvious rash, lesion, or ulcer.    LABORATORY PANEL:   CBC  Recent Labs Lab 04/10/15 2021  WBC 9.1  HGB 8.8*  HCT 27.2*  PLT 335   ------------------------------------------------------------------------------------------------------------------  Chemistries   Recent Labs Lab 04/10/15 2020 04/10/15 2021  NA  --  124*  K  --  5.3*  CL  --  85*  CO2  --  31  GLUCOSE  --  148*  BUN  --  25*  CREATININE  --  0.99  CALCIUM  --  8.7*  MG 1.7  --    ------------------------------------------------------------------------------------------------------------------  Cardiac Enzymes  Recent Labs Lab 04/11/15 0625  TROPONINI 0.07*   ------------------------------------------------------------------------------------------------------------------  RADIOLOGY:  Dg Chest Portable 1 View  04/10/2015   CLINICAL DATA:  Syncope with chest pressure and shortness of breath  EXAM: PORTABLE CHEST - 1 VIEW  COMPARISON:  March 31, 2015  FINDINGS: There is no edema or consolidation. Heart size and pulmonary vascularity are normal. Much of the stomach is above the diaphragm. No adenopathy. There is an old healed fracture  of the proximal right humerus.  IMPRESSION: Much of the stomach is above the diaphragm. No edema or consolidation. Old healed fracture proximal right humerus.   Electronically Signed   By: Bretta Bang III M.D.   On: 04/10/2015 21:21    EKG:    Orders placed or performed during the hospital encounter of 04/10/15  . ED EKG (<10mins upon arrival to the ED)  . ED EKG (<25mins upon arrival to the ED)  . ED EKG  . ED EKG  . EKG 12-Lead  . EKG 12-Lead  . EKG 12-Lead  . EKG 12-Lead    ASSESSMENT AND PLAN:   81 year old female with past medical history significant for paroxysmal atrial fibrillation, diastolic congestive heart failure who was in the hospital about a week ago and was discharged on Bumex comes back with syncope and hypotension.  #1 hyponatremia-could be hypovolemic. Received IV fluids yesterday. -Recheck sodium this morning if improving hold off on IV fluids vs fluid restriction. -Continue to hold diabetics at this time.  #2 syncope-secondary to orthostatic hypotension, blood pressure is improved after IV fluids. Hold off IV fluids for now. Continue to monitor on telemetry. - recycle cardiac enzymes. -Slightly elevated due to stress, and demand ischemia. Likely no acute MI.  #3 acute on chronic diastolic CHF-history of dietary and medication noncompliance issues as outpatient. -Hold Bumex for now. Seems well compensated. Ex line-seen by [facility] cardiology last admission last week.  #4 chronic anemia-outpatient hematology follow-up as prior history of iron infusions. Stable for now.  #5 COPD-continue home inhalers. appears very stable.  #6 paroxysmal atrial fibrillation-rate controlled on Bystolic. Only on aspirin for anticoagulation due to history of severe anemia.  #7 DVT prophylaxis-and subcutaneous heparin.   All the records are reviewed and case discussed with Care Management/Social Workerr. Management plans discussed with the patient, family and they are in agreement.  CODE STATUS: Full Code  TOTAL TIME TAKING CARE OF THIS PATIENT: 38 minutes.   POSSIBLE D/C TOMORROW, DEPENDING ON CLINICAL CONDITION.   Ota Ebersole M.D on 04/11/2015 at 12:36 PM  Between 7am to 6pm - Pager -  [phone]  After 6pm go to [email or web address] - password [email or web address]  [facility]  Office  [phone]  CC: Primary care physician; Marisue Ivan, MD

## 2015-04-12 ENCOUNTER — Telehealth: Payer: Self-pay | Admitting: *Deleted

## 2015-04-12 LAB — BASIC METABOLIC PANEL
Anion gap: 5 (ref 5–15)
BUN: 18 mg/dL (ref 6–20)
CO2: 31 mmol/L (ref 22–32)
Calcium: 8.4 mg/dL — ABNORMAL LOW (ref 8.9–10.3)
Chloride: 90 mmol/L — ABNORMAL LOW (ref 101–111)
Creatinine, Ser: 0.67 mg/dL (ref 0.44–1.00)
Glucose, Bld: 112 mg/dL — ABNORMAL HIGH (ref 65–99)
POTASSIUM: 4.8 mmol/L (ref 3.5–5.1)
Sodium: 126 mmol/L — ABNORMAL LOW (ref 135–145)

## 2015-04-12 MED ORDER — SODIUM CHLORIDE 0.9 % IV SOLN
INTRAVENOUS | Status: DC
  Administered 2015-04-12: 08:00:00 via INTRAVENOUS

## 2015-04-12 MED ORDER — SODIUM CHLORIDE 0.9 % IJ SOLN: 3.0000 mL | INTRAMUSCULAR | Status: DC | PRN

## 2015-04-12 NOTE — Progress Notes (Signed)
PT Cancellation Note  Patient Details Name: Catherine Holmes MRN: [id number] DOB: 05-22-1928   Cancelled Treatment:    Reason Eval/Treat Not Completed: Patient declined, no reason specified (Evaluation re-attempted.  Patient continues to decline participation. Now up in chair, but reports LEs are "too tired and sore" for participation with PT.  Patient continues to report that she is going home next date.  Will re-attempt in AM as appropriate)  Brantleigh Mifflin H. Manson Passey, PT, DPT, NCS 04/12/2015, 3:06 PM [phone]

## 2015-04-12 NOTE — Progress Notes (Signed)
PT Cancellation Note  Patient Details Name: Catherine Holmes MRN: [id number] DOB: March 23, 1928   Cancelled Treatment:    Reason Eval/Treat Not Completed: Patient declined, no reason specified (Consult received and chart reviewed.  Patient sleeping, but arousable, upon arrival to room.  Reports "I'm not going to get up now.  I'm just going to take a little nap".  Unable to encourage/promote participation at this time.  Will re-attempt at later time/date as medically appropriate and patient agreeable.)   Easter Schinke H. Manson Passey, PT, DPT, NCS 04/12/2015, 1:47 PM [phone]

## 2015-04-12 NOTE — Progress Notes (Signed)
VSS. 3 L of oxygen. NSR. Takes meds ok. Slept most of the shift. Taking pt own meds. Pt has not reported any pain. Up to chair and tolerated it well. Daughter was updated. Refused to work with PT. IVF was stopped due to wheezing and possible CHF starting. Pt has no further concerns at this time.

## 2015-04-12 NOTE — Progress Notes (Signed)
Called daughter Gavin Pound per pt request. Daughter was updated on plan to not d/c.

## 2015-04-12 NOTE — Telephone Encounter (Signed)
Pt called and did not say for what reason but would like someone to call her  "said it was important"  Please call patient

## 2015-04-12 NOTE — Telephone Encounter (Signed)
Left message on pt's vm. I see that pt is currently in [facility] and has been since 6/19.

## 2015-04-12 NOTE — Progress Notes (Signed)
[facility] - [place] at [facility]   PATIENT NAME: Colisha Redler    MR#:  [id number]  DATE OF BIRTH:  08/21/1928  SUBJECTIVE:  CHIEF COMPLAINT:   Chief Complaint  Patient presents with  . Loss of Consciousness  . Chest Pain   - Sodium continues to be low at 126 today. Improved with IV fluids yesterday. -Patient feels very tired and sleepy today.  REVIEW OF SYSTEMS:  Review of Systems  Constitutional: Negative for fever and chills.       Tiredness  Respiratory: Negative for cough, shortness of breath and wheezing.   Cardiovascular: Negative for chest pain and palpitations.  Gastrointestinal: Negative for nausea, vomiting, abdominal pain, diarrhea and constipation.  Genitourinary: Negative for dysuria.  Neurological: Negative for dizziness, seizures and headaches.    DRUG ALLERGIES:   Allergies  Allergen Reactions  . Atorvastatin   . Codeine   . Doxycycline   . Epinephrine Hives and Itching  . Erythromycin   . Fluticasone-Salmeterol   . Lasix [Furosemide] Itching  . Levofloxacin   . Penicillins   . Propoxyphene Hcl   . Rosuvastatin   . Simvastatin   . Singlet [Chlorphen-Pe-Acetaminophen] Other (See Comments)    Reaction: Wheezing  . Sulfamethoxazole-Trimethoprim   . Teriparatide   . Cefaclor Rash and Other (See Comments)    Reaction: Delirious    . Glipizide Hives  . Lisinopril Hives and Itching    VITALS:  Blood pressure 160/76, pulse 61, temperature 98.6 F (37 C), temperature source Oral, resp. rate 20, height  (1.549 m), weight 76.975 kg (169 lb 11.2 oz), SpO2 99 %.  PHYSICAL EXAMINATION:  Physical Exam  GENERAL:  81 y.o.-year-old patient lying in the bed with no acute distress.  EYES: Pupils equal, round, reactive to light and accommodation. No scleral icterus. Extraocular muscles intact.  HEENT: Head atraumatic, normocephalic. Oropharynx and nasopharynx clear.  NECK:  Supple, no jugular venous distention. No thyroid  enlargement, no tenderness.  LUNGS: Normal breath sounds bilaterally, no wheezing, rales,rhonchi or crepitation. No use of accessory muscles of respiration.  CARDIOVASCULAR: S1, S2 normal. No murmurs, rubs, or gallops.  ABDOMEN: Soft, nontender, nondistended. Bowel sounds present. No organomegaly or mass.  EXTREMITIES: No pedal edema, cyanosis, or clubbing.  NEUROLOGIC: Cranial nerves II through XII are intact. Muscle strength 5/5 in all extremities. Sensation intact. Gait not checked.  PSYCHIATRIC: The patient is alert and oriented x 3.  SKIN: No obvious rash, lesion, or ulcer.    LABORATORY PANEL:   CBC  Recent Labs Lab 04/10/15 2021  WBC 9.1  HGB 8.8*  HCT 27.2*  PLT 335   ------------------------------------------------------------------------------------------------------------------  Chemistries   Recent Labs Lab 04/10/15 2020  04/12/15 0427  NA  --   < > 126*  K  --   < > 4.8  CL  --   < > 90*  CO2  --   < > 31  GLUCOSE  --   < > 112*  BUN  --   < > 18  CREATININE  --   < > 0.67  CALCIUM  --   < > 8.4*  MG 1.7  --   --   < > = values in this interval not displayed. ------------------------------------------------------------------------------------------------------------------  Cardiac Enzymes  Recent Labs Lab 04/11/15 1255  TROPONINI 0.07*   ------------------------------------------------------------------------------------------------------------------  RADIOLOGY:  Dg Chest Portable 1 View  04/10/2015   CLINICAL DATA:  Syncope with chest pressure and shortness of breath  EXAM: PORTABLE  CHEST - 1 VIEW  COMPARISON:  March 31, 2015  FINDINGS: There is no edema or consolidation. Heart size and pulmonary vascularity are normal. Much of the stomach is above the diaphragm. No adenopathy. There is an old healed fracture of the proximal right humerus.  IMPRESSION: Much of the stomach is above the diaphragm. No edema or consolidation. Old healed fracture proximal  right humerus.   Electronically Signed   By: Bretta Bang III M.D.   On: 04/10/2015 21:21    EKG:   Orders placed or performed during the hospital encounter of 04/10/15  . ED EKG (<43mins upon arrival to the ED)  . ED EKG (<55mins upon arrival to the ED)  . ED EKG  . ED EKG  . EKG 12-Lead  . EKG 12-Lead  . EKG 12-Lead  . EKG 12-Lead    ASSESSMENT AND PLAN:   81 year old female with past medical history significant for paroxysmal atrial fibrillation, diastolic congestive heart failure who was in the hospital about a week ago and was discharged on Bumex comes back with syncope and hypotension.  #1 hyponatremia-could be hypovolemic. Received IV fluids yesterday. Sodium improved up to 128, now again down to 126. Gentle hydration again today and monitor sodium -Recheck sodium this morning if still low tomorrow, will need to check urine electrolytes, nephrology consult and fluid restriction. -Continue to hold diuretics at this time.  #2 syncope-secondary to orthostatic hypotension, blood pressure is improved after IV fluids. -Continue to monitor on telemetry. - recycle cardiac enzymes. -Slightly elevated due to stress, and demand ischemia. Likely no acute MI. -Up with physical therapy today  #3 chronic diastolic CHF-history of dietary and medication noncompliance issues as outpatient. -Hold Bumex for now. Seems well compensated.  -seen by Sal[facility] Fick cardiology last admission last week. -Chest x-ray tomorrow  #4 chronic anemia-outpatient hematology follow-up as prior history of iron infusions. Stable for now.  #5 COPD-continue home inhalers. appears very stable.  #6 paroxysmal atrial fibrillation-rate controlled on Bystolic. Only on aspirin for anticoagulation due to history of severe anemia.  #7 DVT prophylaxis-and subcutaneous heparin.   All the records are reviewed and case discussed with Care Management/Social Workerr. Management plans discussed with the patient, family  and they are in agreement.  CODE STATUS: Full Code  TOTAL TIME TAKING CARE OF THIS PATIENT: 38 minutes.   POSSIBLE D/C TOMORROW, DEPENDING ON CLINICAL CONDITION.   Enid Baas M.D on 04/12/2015 at 2:11 PM  Between 7am to 6pm - Pager - [phone]  After 6pm go to [email or web address] - password [email or web address] [facility]  [place] [facility]  Office  [phone]  CC: Primary care physician; Marisue Ivan, MD

## 2015-04-13 ENCOUNTER — Encounter: Payer: Self-pay | Admitting: Cardiovascular Disease

## 2015-04-13 ENCOUNTER — Ambulatory Visit (INDEPENDENT_AMBULATORY_CARE_PROVIDER_SITE_OTHER): Payer: Medicare Other | Admitting: Cardiovascular Disease

## 2015-04-13 ENCOUNTER — Telehealth: Payer: Self-pay | Admitting: Cardiovascular Disease

## 2015-04-13 ENCOUNTER — Inpatient Hospital Stay: Payer: Medicare Other

## 2015-04-13 VITALS — BP 160/73 | HR 65 | Ht 60.0 in | Wt 167.5 lb

## 2015-04-13 DIAGNOSIS — R55 Syncope and collapse: Secondary | ICD-10-CM | POA: Diagnosis not present

## 2015-04-13 DIAGNOSIS — I10 Essential (primary) hypertension: Secondary | ICD-10-CM | POA: Diagnosis not present

## 2015-04-13 DIAGNOSIS — D649 Anemia, unspecified: Secondary | ICD-10-CM

## 2015-04-13 DIAGNOSIS — I5033 Acute on chronic diastolic (congestive) heart failure: Secondary | ICD-10-CM

## 2015-04-13 LAB — BASIC METABOLIC PANEL
Anion gap: 7 (ref 5–15)
BUN: 15 mg/dL (ref 6–20)
CHLORIDE: 90 mmol/L — AB (ref 101–111)
CO2: 31 mmol/L (ref 22–32)
CREATININE: 0.64 mg/dL (ref 0.44–1.00)
Calcium: 8.5 mg/dL — ABNORMAL LOW (ref 8.9–10.3)
Glucose, Bld: 99 mg/dL (ref 65–99)
POTASSIUM: 4.4 mmol/L (ref 3.5–5.1)
Sodium: 128 mmol/L — ABNORMAL LOW (ref 135–145)

## 2015-04-13 MED ORDER — IPRATROPIUM-ALBUTEROL 0.5-2.5 (3) MG/3ML IN SOLN
3.0000 mL | RESPIRATORY_TRACT | Status: AC
  Administered 2015-04-13: 3 mL via RESPIRATORY_TRACT
  Filled 2015-04-13: qty 3

## 2015-04-13 MED ORDER — METHYLPREDNISOLONE SODIUM SUCC 125 MG IJ SOLR
60.0000 mg | INTRAMUSCULAR | Status: AC
  Administered 2015-04-13: 60 mg via INTRAVENOUS
  Filled 2015-04-13: qty 2

## 2015-04-13 MED ORDER — PREDNISONE 10 MG (21) PO TBPK: 10.0000 mg | ORAL_TABLET | Freq: Every day | ORAL | Status: DC

## 2015-04-13 NOTE — Progress Notes (Signed)
[facility] - [place] at [facility]   PATIENT NAME: Catherine Holmes    MR#:  [id number]  DATE OF BIRTH:  22-Sep-1928  SUBJECTIVE:  CHIEF COMPLAINT:   Chief Complaint  Patient presents with  . Loss of Consciousness  . Chest Pain   - Sodium improves to 128, feels much better - worked with physical therapy today  REVIEW OF SYSTEMS:  Review of Systems  Constitutional: Negative for fever and chills.       Tiredness  Respiratory: Positive for shortness of breath. Negative for cough and wheezing.   Cardiovascular: Negative for chest pain and palpitations.  Gastrointestinal: Negative for nausea, vomiting, abdominal pain, diarrhea and constipation.  Genitourinary: Negative for dysuria.  Neurological: Negative for dizziness, seizures and headaches.    DRUG ALLERGIES:   Allergies  Allergen Reactions  . Atorvastatin   . Codeine   . Doxycycline   . Epinephrine Hives and Itching  . Erythromycin   . Fluticasone-Salmeterol   . Lasix [Furosemide] Itching  . Levofloxacin   . Penicillins   . Propoxyphene Hcl   . Rosuvastatin   . Simvastatin   . Singlet [Chlorphen-Pe-Acetaminophen] Other (See Comments)    Reaction: Wheezing  . Sulfamethoxazole-Trimethoprim   . Teriparatide   . Cefaclor Rash and Other (See Comments)    Reaction: Delirious    . Glipizide Hives  . Lisinopril Hives and Itching    VITALS:  Blood pressure 166/52, pulse 63, temperature 98.1 F (36.7 C), temperature source Oral, resp. rate 17, height  (1.549 m), weight 76.975 kg (169 lb 11.2 oz), SpO2 95 %.  PHYSICAL EXAMINATION:  Physical Exam  GENERAL:  81 y.o.-year-old patient lying in the bed with no acute distress.  EYES: Pupils equal, round, reactive to light and accommodation. No scleral icterus. Extraocular muscles intact.  HEENT: Head atraumatic, normocephalic. Oropharynx and nasopharynx clear.  NECK:  Supple, no jugular venous distention. No thyroid enlargement, no tenderness.   LUNGS: scatted wheezes heard on auscultation today, no rales,rhonchi or crepitation. No use of accessory muscles of respiration.  CARDIOVASCULAR: S1, S2 normal.3/6 systolic murmur present, no rubs, or gallops.  ABDOMEN: Soft, nontender, nondistended. Bowel sounds present. No organomegaly or mass.  EXTREMITIES: No pedal edema, cyanosis, or clubbing.  NEUROLOGIC: Cranial nerves II through XII are intact. Muscle strength 5/5 in all extremities. Sensation intact. Gait not checked.  PSYCHIATRIC: The patient is alert and oriented x 3.  SKIN: No obvious rash, lesion, or ulcer.    LABORATORY PANEL:   CBC  Recent Labs Lab 04/10/15 2021  WBC 9.1  HGB 8.8*  HCT 27.2*  PLT 335   ------------------------------------------------------------------------------------------------------------------  Chemistries   Recent Labs Lab 04/10/15 2020  04/13/15 0415  NA  --   < > 128*  K  --   < > 4.4  CL  --   < > 90*  CO2  --   < > 31  GLUCOSE  --   < > 99  BUN  --   < > 15  CREATININE  --   < > 0.64  CALCIUM  --   < > 8.5*  MG 1.7  --   --   < > = values in this interval not displayed. ------------------------------------------------------------------------------------------------------------------  Cardiac Enzymes  Recent Labs Lab 04/11/15 1255  TROPONINI 0.07*   ------------------------------------------------------------------------------------------------------------------  RADIOLOGY:  Dg Chest 2 View  04/13/2015   CLINICAL DATA:  CHF, syncope, hypotension  EXAM: CHEST  2 VIEW  COMPARISON:  04/10/2015, 06/08/2014  FINDINGS: Stable cardiomegaly with central vascular congestion. Background COPD/ emphysema evident, better demonstrated on the chest CT comparison. Large hiatal hernia noted as before. Minor bibasilar atelectasis. No superimposed definite pneumonia, collapse or consolidation. No effusion, edema or pneumothorax. Atherosclerosis of the aorta evident. Degenerative changes of  the spine. Healed fracture of the right humerus surgical neck evident. Chronic compression fracture of the mid thoracic spine on the lateral view.  IMPRESSION: Stable cardiomegaly with vascular congestion  Large hiatal hernia  Aortic atherosclerosis  Pulmonary emphysema  No significant interval change.   Electronically Signed   By: Judie Petit.  Shick M.D.   On: 04/13/2015 08:01    EKG:   Orders placed or performed during the hospital encounter of 04/10/15  . ED EKG (<79mins upon arrival to the ED)  . ED EKG (<63mins upon arrival to the ED)  . ED EKG  . ED EKG  . EKG 12-Lead  . EKG 12-Lead  . EKG 12-Lead  . EKG 12-Lead    ASSESSMENT AND PLAN:   81 year old female with past medical history significant for paroxysmal atrial fibrillation, diastolic congestive heart failure who was in the hospital about a week ago and was discharged on Bumex comes back with syncope and hypotension.  #1 hyponatremia-could be hypovolemic.and also from her CHF -. Sodium improved up to 128 - recommend fluid restriction, and bumex at discharge -outpatient nephrology consult  #2 syncope-secondary to orthostatic hypotension, blood pressure is improved after IV fluids. -Continue to monitor on telemetry. - recycle cardiac enzymes. -Slightly elevated due to stress, and demand ischemia. Likely no acute MI.  #3 chronic diastolic CHF-history of dietary and medication noncompliance issues as outpatient. - Bumex was held in the hospital, restart at qdaily dosing at discharge. Seems well compensated.  -seen by Salomon Fick cardiology last admission last week. -Chest x-ray today with no significant changes, stable vascular congestion and COPD changes  #4 chronic anemia-outpatient hematology follow-up as prior history of iron infusions. Stable for now.  #5 COPD-continue home inhalers.some wheezing noted today, start prednisone taper at discharge and nebulisers  #6 paroxysmal atrial fibrillation-rate controlled on Bystolic. Only  on aspirin for anticoagulation due to history of severe anemia.  #7 DVT prophylaxis-and subcutaneous heparin.  Home health recommended by Physical Therapy- but patient refusing  All the records are reviewed and case discussed with Care Management/Social Workerr. Management plans discussed with the patient, family and they are in agreement.  CODE STATUS: Full Code  TOTAL TIME TAKING CARE OF THIS PATIENT: 38 minutes.   POSSIBLE D/C today, DEPENDING ON CLINICAL CONDITION.   Enid Baas M.D on 04/13/2015 at 1:18 PM  Between 7am to 6pm - Pager - [phone]  After 6pm go to [email or web address] - password [email or web address] [facility]  [place] [facility]  Office  [phone]  CC: Primary care physician; Marisue Ivan, MD

## 2015-04-13 NOTE — Assessment & Plan Note (Signed)
Blood pressure elevated today. Recommended she restart her regular outpatient medications

## 2015-04-13 NOTE — Care Management (Signed)
Spoke with patient and her daughter and both are in agreement with discharge home today.  Both decline in unison any type of facility placement.  Now in agreement with home health nursing and requests telehealth. Patient does not wish to have physical therapy but agrees numerous time now to the nurse.

## 2015-04-13 NOTE — Patient Instructions (Addendum)
You are doing well. No medication changes were made.  You are scheduled to see Verlon Au, NP at the [facility] on Friday, June 24 @ 9:30   Please call us if you have new issues that need to be addressed before your next appt.  Your physician wants you to follow-up in: 1 month.

## 2015-04-13 NOTE — Progress Notes (Signed)
NSR. 3 L of oxygen. Pt has not reported any pain. Iv and tele removed. Prescription given to pt. Discharge instructions given to pt. Home health was set up per Nann. Pt own meds were returned and verified by pt daughter. Pt has no further concerns at this time.

## 2015-04-13 NOTE — Progress Notes (Signed)
   04/13/15 1100  Clinical Encounter Type  Visited With Patient  Visit Type Follow-up  Spiritual Encounters  Spiritual Needs Prayer  Stress Factors  Patient Stress Factors Health changes  Family Stress Factors None identified   Faith tradition: Christian Status: alert oriented Age/Sex: 4, female Family: not present Visit Assessment: After interdisciplinary meeting, chaplain visited patient. She seems to be in good spirits. She requested prayer. She, CNA Rosey Bath, and the chaplain engaged in a prayer for healing and she requested that we pray for the world. The patient complained about Rehab,she says she doesn't feel able to wear the belt and go down the hall. She says her stomach is in pain and can't take anymore shots. She says that she will have Rehab at [facility] and [facility]. She says they allow her to bring her dogs and that she loves her dogs. The patient led a portion of our prayer.The patient also mentioned her daughter and she says that her daughter tells her she prays too much.  The chaplain and pastoral care can be reached via pager, [phone] or an order can be placed online

## 2015-04-13 NOTE — Telephone Encounter (Signed)
Patient dc today [facility] 2a s/p syncope collapse and cp.  Patient was not seen in hospital by cardiology.  Per orders she needs 1 wk fu.  Mariah Milling has a 3:15 for today,  Otherwise the next available is 05-04-15.  Patient may be able to make it if not hospital will call to reschedule.

## 2015-04-13 NOTE — Telephone Encounter (Signed)
Patient will be here. 

## 2015-04-13 NOTE — Discharge Summary (Signed)
[facility] - [facility] at [facility]   PATIENT NAME: Catherine Holmes    MR#:  [id number]  DATE OF BIRTH:  06-May-1928  DATE OF ADMISSION:  04/10/2015 ADMITTING PHYSICIAN: Wyatt Haste, MD  DATE OF DISCHARGE: 04/13/2015  PRIMARY CARE PHYSICIAN: Marisue Ivan, MD    ADMISSION DIAGNOSIS:  Syncope and collapse [R55]  DISCHARGE DIAGNOSIS:  Principal Problem:   Syncope Active Problems:   Hyponatremia   SECONDARY DIAGNOSIS:   Past Medical History  Diagnosis Date  . Essential hypertension   . Diabetes mellitus type II, controlled   . COPD (chronic obstructive pulmonary disease)   . Osteoporosis   . Carotid arterial disease     a. 2012 Carotid dopplers: 40-59% R ICA, 60-79% L ICA  . Tic douloureux   . Arthralgia   . Sjoegren syndrome   . TIA (transient ischemic attack)   . Emphysema   . MVP (mitral valve prolapse)   . Chronic diastolic CHF (congestive heart failure)     a. 08/2011 Echo: EF 65-70%, mild LVH, mod dil LA, mildly dil RA, mild MR, mild-mod AoV Sclerosis w/o stenosis, mod-sev elev PA pressures, mild TR.  Marland Kitchen Acid reflux   . Diverticulitis   . Anemia   . History of pneumonia   . Syncope and collapse   . Anemia   . Hip fracture, left   . Hyperlipidemia   . Hiatal hernia   . Chest pain     a. 08/2011 MV: EF 74%, no ischemia.  . Noncompliance     a. h/o not taking bumex as Rx.  Marland Kitchen PAF (paroxysmal atrial fibrillation)     a. CHA2DS2VASc = 8 ->No OAC 2/2 falls.    HOSPITAL COURSE:   81 year old female with past medical history significant for paroxysmal atrial fibrillation, diastolic congestive heart failure who was in the hospital about a week ago and was discharged on Bumex comes back with syncope and hypotension.  #1 hyponatremia-could be hypovolemic.and also from her CHF -. Sodium improved up to 128 - recommend fluid restriction, and bumex at discharge -outpatient nephrology consult  #2 syncope-secondary to orthostatic  hypotension, blood pressure is improved after IV fluids. -Continue to monitor on telemetry. - recycle cardiac enzymes. -Slightly elevated due to stress, and demand ischemia. Likely no acute MI.  #3 chronic diastolic CHF-history of dietary and medication noncompliance issues as outpatient. - Bumex was held in the hospital, restart at qdaily dosing at discharge. Seems well compensated.  -seen by [facility]ick cardiology last admission last week. -Chest x-ray today with no significant changes, stable vascular congestion and COPD changes  #4 chronic anemia-outpatient hematology follow-up as prior history of iron infusions. Stable for now.  #5 COPD-continue home inhalers.some wheezing noted today, start prednisone taper at discharge and nebulisers  #6 paroxysmal atrial fibrillation-rate controlled on Bystolic. Only on aspirin for anticoagulation due to history of severe anemia.  DISCHARGE CONDITIONS:   Guarded  CONSULTS OBTAINED:  Treatment Team:  Wyatt Haste, MD  DRUG ALLERGIES:   Allergies  Allergen Reactions  . Atorvastatin   . Codeine   . Doxycycline   . Epinephrine Hives and Itching  . Erythromycin   . Fluticasone-Salmeterol   . Lasix [Furosemide] Itching  . Levofloxacin   . Penicillins   . Propoxyphene Hcl   . Rosuvastatin   . Simvastatin   . Singlet [Chlorphen-Pe-Acetaminophen] Other (See Comments)    Reaction: Wheezing  . Sulfamethoxazole-Trimethoprim   . Teriparatide   . Cefaclor Rash and Other (  See Comments)    Reaction: Delirious    . Glipizide Hives  . Lisinopril Hives and Itching    DISCHARGE MEDICATIONS:   Current Discharge Medication List    START taking these medications   Details  predniSONE (STERAPRED UNI-PAK 21 TAB) 10 MG (21) TBPK tablet Take 1 tablet (10 mg total) by mouth daily. 6 tabs PO x 1 day 5 tabs PO x 1 day 4 tabs PO x 1 day 3 tabs PO x 1 day 2 tabs PO x 1 day 1 tab PO x 1 day and stop Qty: 21 tablet, Refills: 0      CONTINUE  these medications which have NOT CHANGED   Details  albuterol (PROAIR HFA) 108 (90 BASE) MCG/ACT inhaler Inhale 2 puffs into the lungs every 6 (six) hours as needed. Qty: 3 Inhaler, Refills: 0    albuterol (PROVENTIL) (2.5 MG/3ML) 0.083% nebulizer solution Take 2.5 mg by nebulization every 6 (six) hours as needed for wheezing or shortness of breath.     arformoterol (BROVANA) 15 MCG/2ML NEBU Take 2 mLs (15 mcg total) by nebulization 2 (two) times daily. Qty: 120 mL, Refills: 4    aspirin EC 81 MG tablet Take 81 mg by mouth daily.    beta carotene w/minerals (OCUVITE) tablet Take 1 tablet by mouth 2 (two) times daily.    bumetanide (BUMEX) 1 MG tablet Take 1 tablet (1 mg total) by mouth daily. Take 1 tab daily Qty: 180 tablet, Refills: 3    calcium carbonate (OS-CAL) 600 MG TABS tablet Take 600 mg by mouth daily with breakfast.    Cholecalciferol (VITAMIN D-3 PO) Take 1,000 Units by mouth.    citalopram (CELEXA) 20 MG tablet Take 20 mg by mouth daily.    diphenhydramine-acetaminophen (TYLENOL PM) 25-500 MG TABS Take 1 tablet by mouth at bedtime as needed.    erythromycin ophthalmic ointment Place 1 application into both eyes at bedtime.    ezetimibe (ZETIA) 10 MG tablet Take 1 tablet (10 mg total) by mouth daily. Qty: 90 tablet, Refills: 3    fluticasone (FLONASE) 50 MCG/ACT nasal spray Place 1 spray into both nostrils daily.    gabapentin (NEURONTIN) 100 MG capsule Take 100 mg by mouth 2 (two) times daily.     HYDROcodone-acetaminophen (NORCO/VICODIN) 5-325 MG per tablet Take 1 tablet by mouth as needed for severe pain.     mometasone-formoterol (DULERA) 100-5 MCG/ACT AERO Inhale 2 puffs into the lungs 2 (two) times daily.     Multiple Vitamin (MULTIVITAMIN) tablet Take 1 tablet by mouth daily.      nebivolol (BYSTOLIC) 5 MG tablet Take 1 tablet (5 mg total) by mouth daily. Qty: 30 tablet, Refills: 0    nitroGLYCERIN (NITROSTAT) 0.4 MG SL tablet Place 1 tablet (0.4 mg  total) under the tongue every 5 (five) minutes as needed for chest pain. Qty: 25 tablet, Refills: 6    potassium chloride (K-DUR,KLOR-CON) 10 MEQ tablet Take 5 mEq by mouth 2 (two) times daily as needed. Patient takes this when she takes Bumex.    tiotropium (SPIRIVA) 18 MCG inhalation capsule Place 1 capsule (18 mcg total) into inhaler and inhale daily. Qty: 90 capsule, Refills: 0    triamcinolone (KENALOG) 0.025 % cream Apply 1 application topically daily as needed (for arms).    trimethoprim-polymyxin b (POLYTRIM) ophthalmic solution Place 1 drop into both eyes 2 (two) times daily.      STOP taking these medications     spironolactone (ALDACTONE) 25 MG  tablet      Tiotropium Bromide Monohydrate (SPIRIVA RESPIMAT) 2.5 MCG/ACT AERS          DISCHARGE INSTRUCTIONS:   1. PCP f/u in 1-2 weeks 2. Cardiology f/u in 1-2 weeks 3. Compliance with diet needed and fluid restriction to <1400cc/day  If you experience worsening of your admission symptoms, develop shortness of breath, life threatening emergency, suicidal or homicidal thoughts you must seek medical attention immediately by calling 911 or calling your MD immediately  if symptoms less severe.  You Must read complete instructions/literature along with all the possible adverse reactions/side effects for all the Medicines you take and that have been prescribed to you. Take any new Medicines after you have completely understood and accept all the possible adverse reactions/side effects.   Please note  You were cared for by a hospitalist during your hospital stay. If you have any questions about your discharge medications or the care you received while you were in the hospital after you are discharged, you can call the unit and asked to speak with the hospitalist on call if the hospitalist that took care of you is not available. Once you are discharged, your primary care physician will handle any further medical issues. Please note  that NO REFILLS for any discharge medications will be authorized once you are discharged, as it is imperative that you return to your primary care physician (or establish a relationship with a primary care physician if you do not have one) for your aftercare needs so that they can reassess your need for medications and monitor your lab values.    Today   CHIEF COMPLAINT:   Chief Complaint  Patient presents with  . Loss of Consciousness  . Chest Pain    VITAL SIGNS:  Blood pressure 172/66, pulse 75, temperature 98.1 F (36.7 C), temperature source Oral, resp. rate 17, height  (1.549 m), weight 76.975 kg (169 lb 11.2 oz), SpO2 95 %.  I/O:   Intake/Output Summary (Last 24 hours) at 04/13/15 1402 Last data filed at 04/13/15 1124  Gross per 24 hour  Intake    840 ml  Output   1350 ml  Net   -510 ml    PHYSICAL EXAMINATION:   Physical Exam  GENERAL: 81 y.o.-year-old patient lying in the bed with no acute distress.  EYES: Pupils equal, round, reactive to light and accommodation. No scleral icterus. Extraocular muscles intact.  HEENT: Head atraumatic, normocephalic. Oropharynx and nasopharynx clear.  NECK: Supple, no jugular venous distention. No thyroid enlargement, no tenderness.  LUNGS: scatted wheezes heard on auscultation today, no rales,rhonchi or crepitation. No use of accessory muscles of respiration.  CARDIOVASCULAR: S1, S2 normal.3/6 systolic murmur present, no rubs, or gallops.  ABDOMEN: Soft, nontender, nondistended. Bowel sounds present. No organomegaly or mass.  EXTREMITIES: No pedal edema, cyanosis, or clubbing.  NEUROLOGIC: Cranial nerves II through XII are intact. Muscle strength 5/5 in all extremities. Sensation intact. Gait not checked.  PSYCHIATRIC: The patient is alert and oriented x 3.  SKIN: No obvious rash, lesion, or ulcer.   DATA REVIEW:   CBC  Recent Labs Lab 04/10/15 2021  WBC 9.1  HGB 8.8*  HCT 27.2*  PLT 335     Chemistries   Recent Labs Lab 04/10/15 2020  04/13/15 0415  NA  --   < > 128*  K  --   < > 4.4  CL  --   < > 90*  CO2  --   < >  31  GLUCOSE  --   < > 99  BUN  --   < > 15  CREATININE  --   < > 0.64  CALCIUM  --   < > 8.5*  MG 1.7  --   --   < > = values in this interval not displayed.  Cardiac Enzymes  Recent Labs Lab 04/11/15 1255  TROPONINI 0.07*    Microbiology Results  Results for orders placed or performed in visit on 11/02/13  Clostridium Difficile [facility])     Status: None   Collection Time: 11/07/13 12:04 PM  Result Value Ref Range Status   Micro Text Report   Final       C.DIFFICILE ANTIGEN       C.DIFFICILE GDH ANTIGEN : NEGATIVE   C.DIFFICILE TOXIN A/B     C.DIFFICILE TOXINS A AND B : NEGATIVE   INTERPRETATION            Negative for C. difficile.    ANTIBIOTIC                                                      Stool culture     Status: None   Collection Time: 11/07/13 12:04 PM  Result Value Ref Range Status   Micro Text Report   Final       COMMENT                   NO SALMONELLA OR SHIGELLA ISOLATED   COMMENT                   NO PATHOGENIC E.COLI DETECTED   COMMENT                   NO CAMPYLOBACTER ANTIGEN DETECTED   ANTIBIOTIC                                                        RADIOLOGY:  Dg Chest 2 View  04/13/2015   CLINICAL DATA:  CHF, syncope, hypotension  EXAM: CHEST  2 VIEW  COMPARISON:  04/10/2015, 06/08/2014  FINDINGS: Stable cardiomegaly with central vascular congestion. Background COPD/ emphysema evident, better demonstrated on the chest CT comparison. Large hiatal hernia noted as before. Minor bibasilar atelectasis. No superimposed definite pneumonia, collapse or consolidation. No effusion, edema or pneumothorax. Atherosclerosis of the aorta evident. Degenerative changes of the spine. Healed fracture of the right humerus surgical neck evident. Chronic compression fracture of the mid thoracic spine on the lateral view.   IMPRESSION: Stable cardiomegaly with vascular congestion  Large hiatal hernia  Aortic atherosclerosis  Pulmonary emphysema  No significant interval change.   Electronically Signed   By: Judie Petit.  Shick M.D.   On: 04/13/2015 08:01    EKG:   Orders placed or performed during the hospital encounter of 04/10/15  . ED EKG (<43mins upon arrival to the ED)  . ED EKG (<3mins upon arrival to the ED)  . ED EKG  . ED EKG  . EKG 12-Lead  . EKG 12-Lead  . EKG 12-Lead  . EKG 12-Lead      Management plans discussed with the patient,  family and they are in agreement.  CODE STATUS:     Code Status Orders        Start     Ordered   04/10/15 2322  Full code   Continuous     04/10/15 2323    Advance Directive Documentation        Most Recent Value   Type of Advance Directive  Healthcare Power of Attorney   Pre-existing out of facility DNR order (yellow form or pink MOST form)     "MOST" Form in Place?        TOTAL TIME TAKING CARE OF THIS PATIENT: 40 minutes.    Enid Baas M.D on 04/13/2015 at 2:02 PM  Between 7am to 6pm - Pager - [phone]  After 6pm go to [email or web address] - password [email or web address] [facility]  [facility]  Office  [phone]  CC: Primary care physician; Marisue Ivan, MD

## 2015-04-13 NOTE — Discharge Instructions (Signed)
°  DIET:  Cardiac diet  DISCHARGE CONDITION:  Fair  ACTIVITY:  Activity as tolerated  OXYGEN:  Home Oxygen: Yes   Oxygen Delivery: 2.5L nasal cannula   DISCHARGE LOCATION:  Home with home health   If you experience worsening of your admission symptoms, develop shortness of breath, life threatening emergency, suicidal or homicidal thoughts you must seek medical attention immediately by calling 911 or calling your MD immediately  if symptoms less severe.  You Must read complete instructions/literature along with all the possible adverse reactions/side effects for all the Medicines you take and that have been prescribed to you. Take any new Medicines after you have completely understood and accpet all the possible adverse reactions/side effects.   Please note  You were cared for by a hospitalist during your hospital stay. If you have any questions about your discharge medications or the care you received while you were in the hospital after you are discharged, you can call the unit and asked to speak with the hospitalist on call if the hospitalist that took care of you is not available. Once you are discharged, your primary care physician will handle any further medical issues. Please note that NO REFILLS for any discharge medications will be authorized once you are discharged, as it is imperative that you return to your primary care physician (or establish a relationship with a primary care physician if you do not have one) for your aftercare needs so that they can reassess your need for medications and monitor your lab values.

## 2015-04-13 NOTE — Care Management (Signed)
Spoke with patient regarding discharge disposition.  Patient participated with physical therapy today after declining several treatments.  Patient says she does not need physical therapy  "but if I needed to go to a skilled facility I would chose [facility]."  Discussed the frequent readmissions and the the possible benefit of home health nurse.  Patient says "I take my blood pressure, 02 and blood sugar levels every morning and if they are high I call the doctor".  Discussed that the home health nurse   Could monitor this clinical information with assessment and patient says she may think about it.  Updated attending.  Contacted [facility] and asked to make one more attempt to open patient to service.

## 2015-04-13 NOTE — Assessment & Plan Note (Signed)
Episode of hypotension, likely from very mild dehydration in the setting of anemia Improved blood pressure with fluids. We'll need to watch closely as now with a new cough concerning for acute on chronic diastolic CHF She was given prednisone at the time of discharge but has not started this yet

## 2015-04-13 NOTE — Assessment & Plan Note (Signed)
Appointment made with hematology this Friday at 9:30. Previously required iron transfusion Hematocrit 27. Likely contributing to exacerbation of diastolic CHF, shortness of breath, weakness

## 2015-04-13 NOTE — Progress Notes (Signed)
Patient ID: Catherine Holmes, female    DOB: 1928-03-09, 81 y.o.   MRN: [id number]  HPI Comments: 81 year-old woman with a history of DM,  peripheral vascular disease, 60 to 70% carotid arterial disease, moderate arterial disease of the lower extremities, status post bilateral iliac stenting, moderate renal stenosis, hyperlipidemia, history of severe  hypertension with labile blood pressures, history of episodic shortness of breath and chest squeezing relieved with albuterol/nebs who presents for routine followup. She has a sulfa allergy to loop diuretics. History of falls.   left carotid arterial stenosis estimated at 60-79% disease. She has had several admissions for diastolic CHF, typically from dietary indiscretion and periodic noncompliance with diuretics She was admitted on 11/02/2013 , discharged on 11/16/2013  admitted on 01/24/2014, discharge from 01/27/2014 for acute respiratory failure secondary to COPD History of anemia followed by hematology She presents today for follow-up of her chronic diastolic CHF  Recent hospital admission for acute on chronic diastolic CHF. Improvement of her symptoms with aggressive diuresis. Readmitted this past week to the hospital with hypotension. She has persistent anemia, hematocrit 24 up to 27 She was discharged today and presents to clinic for further evaluation. She does report developing a cough today. She was given a prescription for prednisone to start. She has not started this yet She was given significant IV fluids during her hospital course.  Previously seen by hematology, had iron infusions in the past. She is requesting an appointment with hematology  EKG on today's visit shows normal sinus rhythm with rate 65 bpm, no significant ST or T-wave changes  Other past medical history  Followed by Dr. Cherylann Ratel for her kidney issues  She stopped her amiodarone out of concern for possible pulmonary complications. Denies having any palpitations She  does report having rare episodes of near syncope wall in a sitting position. Comes across her like a wave it resolves relatively quickly  She was in the hospital 05/05/2014 with hypertensive urgency, hyponatremia. Blood pressure is 201/98 and she was put back on her outpatient regiment with improvement of her symptoms. She suffered a fractured hip on 05/07/2014 and we were consulted for preop evaluation. Found to have elevated at her pressures at 65. She was discharged on 05/17/2014 EKGs from her hospital course showed she had atrial fibrillation on 05/09/2014  Previous Echocardiogram in the hospital showed normal ejection fraction, moderate to severe, hypertension, moderately dilated left atrium  Has a history of large hiatal hernia She is not able to tolerate statins. She does handle zetia without significant side effects. previously encouraged to take red yeast rice.   Previous weight October 2014 was 148 pounds.  weight at the end of December 2014 was 160 pounds, she had increasing shortness of breath, had not been taking Bumex    in the hospital at [facility] in 2012 for general malaise, severe hypertension, shortness of breath, chest pain. She had a stress test, lexiscan that showed no significant ischemia. Significant shortness of breath with lexiscan.   hospital admission on 01/08/2012 for shortness of breath and syncope. She was found to have significant anemia. Treated with Lasix for diastolic CHF.  Underlying severe left carotid arterial disease estimated at 70%.   Prior followup with Dr. Welton Flakes in pulmonary with numerous CT scans, MRIs, PFTs, sleep study. She was told that she had hypoxemia overnight and was started on nighttime oxygen.    hospitalization 05/27/2013 for acute on chronic diastolic CHF. She was treated with IV Bumex twice a day with  improvement of her symptoms. Getting factor was her anemia with hematocrit of 25.  Previous iron infusion. In the second week of September 2014  she fell outside a cracker barrel and hit her head. She has been slow to recover. She still has a knot on her head.  Echocardiogram in the hospital August 2014 shows normal ejection fraction, severely elevated right ventricular systolic pressures, mild to moderate TR  Outpatient Encounter Prescriptions as of 04/13/2015  Medication Sig  . albuterol (PROAIR HFA) 108 (90 BASE) MCG/ACT inhaler Inhale 2 puffs into the lungs every 6 (six) hours as needed.  Marland Kitchen albuterol (PROVENTIL) (2.5 MG/3ML) 0.083% nebulizer solution Take 2.5 mg by nebulization every 6 (six) hours as needed for wheezing or shortness of breath.   Marland Kitchen arformoterol (BROVANA) 15 MCG/2ML NEBU Take 2 mLs (15 mcg total) by nebulization 2 (two) times daily.  Marland Kitchen aspirin EC 81 MG tablet Take 81 mg by mouth daily.  Marland Kitchen BETA CAROTENE PO Take by mouth 2 (two) times daily.  . beta carotene w/minerals (OCUVITE) tablet Take 1 tablet by mouth 2 (two) times daily.  . bumetanide (BUMEX) 1 MG tablet Take 1 tablet (1 mg total) by mouth daily. Take 1 tab daily  . calcium carbonate (OS-CAL) 600 MG TABS tablet Take 600 mg by mouth daily with breakfast.  . Cholecalciferol (VITAMIN D-3 PO) Take 1,000 Units by mouth.  . citalopram (CELEXA) 20 MG tablet Take 20 mg by mouth daily.  . diphenhydramine-acetaminophen (TYLENOL PM) 25-500 MG TABS Take 1 tablet by mouth at bedtime as needed.  Marland Kitchen erythromycin ophthalmic ointment Place 1 application into both eyes at bedtime.  Marland Kitchen ezetimibe (ZETIA) 10 MG tablet Take 1 tablet (10 mg total) by mouth daily.  . fluticasone (FLONASE) 50 MCG/ACT nasal spray Place 1 spray into both nostrils daily.  Marland Kitchen gabapentin (NEURONTIN) 100 MG capsule Take 100 mg by mouth 2 (two) times daily.   Marland Kitchen HYDROcodone-acetaminophen (NORCO/VICODIN) 5-325 MG per tablet Take 1 tablet by mouth as needed for severe pain.   . mometasone-formoterol (DULERA) 100-5 MCG/ACT AERO Inhale 2 puffs into the lungs 2 (two) times daily.   . Multiple Vitamin (MULTIVITAMIN)  tablet Take 1 tablet by mouth daily.    . nebivolol (BYSTOLIC) 5 MG tablet Take 1 tablet (5 mg total) by mouth daily.  . nitroGLYCERIN (NITROSTAT) 0.4 MG SL tablet Place 1 tablet (0.4 mg total) under the tongue every 5 (five) minutes as needed for chest pain.  . potassium chloride (K-DUR,KLOR-CON) 10 MEQ tablet Take 5 mEq by mouth 2 (two) times daily as needed. Patient takes this when she takes Bumex.  . predniSONE (STERAPRED UNI-PAK 21 TAB) 10 MG (21) TBPK tablet Take 1 tablet (10 mg total) by mouth daily. 6 tabs PO x 1 day 5 tabs PO x 1 day 4 tabs PO x 1 day 3 tabs PO x 1 day 2 tabs PO x 1 day 1 tab PO x 1 day and stop  . tiotropium (SPIRIVA) 18 MCG inhalation capsule Place 1 capsule (18 mcg total) into inhaler and inhale daily.  Marland Kitchen triamcinolone (KENALOG) 0.025 % cream Apply 1 application topically daily as needed (for arms).  . trimethoprim-polymyxin b (POLYTRIM) ophthalmic solution Place 1 drop into both eyes 2 (two) times daily.  . [DISCONTINUED] alum & mag hydroxide-simeth (MAALOX/MYLANTA) 200-200-20 MG/5ML suspension 30 mL   . [DISCONTINUED] aspirin EC tablet 81 mg   . [DISCONTINUED] citalopram (CELEXA) tablet 20 mg   . [DISCONTINUED] diphenhydramine-acetaminophen (TYLENOL PM) 25-500 MG per  tablet 1 tablet   . [DISCONTINUED] erythromycin ophthalmic ointment 1 application   . [DISCONTINUED] fluticasone (FLONASE) 50 MCG/ACT nasal spray 1 spray   . [DISCONTINUED] gabapentin (NEURONTIN) capsule 100 mg   . [DISCONTINUED] heparin injection 5,000 Units   . [DISCONTINUED] HYDROcodone-acetaminophen (NORCO/VICODIN) 5-325 MG per tablet 1 tablet   . [DISCONTINUED] mometasone-formoterol (DULERA) 100-5 MCG/ACT inhaler 2 puff   . [DISCONTINUED] morphine 2 MG/ML injection 2 mg   . [DISCONTINUED] multivitamin-lutein (OCUVITE-LUTEIN) capsule 1 capsule   . [DISCONTINUED] nebivolol (BYSTOLIC) tablet 5 mg   . [DISCONTINUED] nitroGLYCERIN (NITROSTAT) SL tablet 0.4 mg   . [DISCONTINUED] ondansetron  (ZOFRAN) injection 4 mg   . [DISCONTINUED] ondansetron (ZOFRAN) tablet 4 mg   . [DISCONTINUED] sodium chloride 0.9 % injection 3 mL   . [DISCONTINUED] Tiotropium Bromide Monohydrate AERS 1 puff   . [DISCONTINUED] trimethoprim-polymyxin b (POLYTRIM) ophthalmic solution 1 drop    No facility-administered encounter medications on file as of 04/13/2015.   Social hx History   Social History  . Marital Status: Single    Spouse Name: N/A  . Number of Children: 2  . Years of Education: N/A   Occupational History  . Retired     former IT sales professional   Social History Main Topics  . Smoking status: Former Smoker -- 0.50 packs/day for 50 years    Types: Cigarettes    Quit date: 10/22/2000  . Smokeless tobacco: Never Used  . Alcohol Use: No  . Drug Use: No  . Sexual Activity: Not on file   Other Topics Concern  . Not on file   Social History Narrative    Review of Systems  Constitutional: Positive for fatigue.  Respiratory: Positive for cough.   Cardiovascular: Negative.   Gastrointestinal: Negative.   Musculoskeletal: Positive for myalgias, arthralgias and gait problem.  Neurological: Positive for weakness.  Hematological: Negative.   Psychiatric/Behavioral: Negative.   All other systems reviewed and are negative.   BP 160/73 mmHg  Pulse 65  Ht 5' (1.524 m)  Wt 167 lb 8 oz (75.978 kg)  BMI 32.71 kg/m2  Physical Exam  Constitutional: She is oriented to person, place, and time. She appears well-developed and well-nourished.  Appears pale  HENT:  Head: Normocephalic.  Nose: Nose normal.  Mouth/Throat: Oropharynx is clear and moist.  Eyes: Conjunctivae are normal. Pupils are equal, round, and reactive to light.  Neck: Normal range of motion. Neck supple. No JVD present.  Cardiovascular: Normal rate, regular rhythm, S1 normal, S2 normal, normal heart sounds and intact distal pulses.  Exam reveals no gallop and no friction rub.   No murmur heard. Pulmonary/Chest: Effort  normal and breath sounds normal. No respiratory distress. She has no wheezes. She has no rales. She exhibits no tenderness.  Abdominal: Soft. Bowel sounds are normal. She exhibits no distension. There is no tenderness.  Musculoskeletal: Normal range of motion. She exhibits no edema or tenderness.  Lymphadenopathy:    She has no cervical adenopathy.  Neurological: She is alert and oriented to person, place, and time. Coordination normal.  Skin: Skin is warm and dry. No rash noted. No erythema.  Psychiatric: She has a normal mood and affect. Her behavior is normal. Judgment and thought content normal.    Assessment and Plan  Nursing note and vitals reviewed.

## 2015-04-13 NOTE — Assessment & Plan Note (Signed)
Recommended she stay on her Bumex 1 mg daily She tends to eat out frequently leading to exacerbation of her CHF

## 2015-04-13 NOTE — Evaluation (Signed)
Physical Therapy Evaluation Patient Details Name: Catherine Holmes MRN: [id number] DOB: May 23, 1928 Today's Date: 04/13/2015   History of Present Illness  presented to ER and admitted for acute hospitalization secondary to syncopal episode with hypotension.  Of note, patient with recent admission due to CHF exacerbation; discharged home several days prior to this admission.  Clinical Impression  Upon evaluation, patient alert and oriented to basic information; follows all commands.  Agreeable to minimal participation; somewhat self-limiting in activity level.  Bilat UE/LE strength and ROM grossly WFL and symmetrical.  Patient received on RA with sats at 84%; requires application of [place] at 3L for recovery to >92% at rest and with activity.  Able to complete sit/stand, basic transfers and short-distance gait (25') with RW, cga/min assist.  Slow and effortful, but no overt LOB.  Vitals stable and WFL (no orthostatics noted). Patient refusing further activity or therex at this time. Would benefit from skilled PT to address above deficits and promote optimal return to PLOF;Recommend transition to HHPT upon discharge from acute hospitalization.     Follow Up Recommendations Home health PT (if patient agreeable)    Equipment Recommendations       Recommendations for Other Services       Precautions / Restrictions Precautions Precautions: Fall Restrictions Weight Bearing Restrictions: No      Mobility  Bed Mobility               General bed mobility comments: seated on toilet beginning of session; in recliner end of session  Transfers Overall transfer level: Needs assistance Equipment used: Rolling walker (2 wheeled) Transfers: Sit to/from Stand Sit to Stand: Supervision;Min guard         General transfer comment: cuing for hand placement; slow speed of movement transition  Ambulation/Gait Ambulation/Gait assistance: Min guard Ambulation Distance (Feet): 25 Feet Assistive  device: Rolling walker (2 wheeled)     Gait velocity interpretation: <1.8 ft/sec, indicative of risk for recurrent falls General Gait Details: Decreased step height/length, decreased cadence and overall gait speed.  Forward flexed posture.  Single standing rest period during gait trial.  Refused further gait trials due to "weakness"; vitals stable and [facility]  Stairs            Wheelchair Mobility    Modified Rankin (Stroke Patients Only)       Balance Overall balance assessment: Needs assistance Sitting-balance support: No upper extremity supported;Feet supported Sitting balance-Leahy Scale: Good     Standing balance support: Bilateral upper extremity supported Standing balance-Leahy Scale: Fair                               Pertinent Vitals/Pain Pain Assessment: No/denies pain    Home Living Family/patient expects to be discharged to:: Private residence Living Arrangements: Children Available Help at Discharge: Family Type of Home: House Home Access: Stairs to enter Entrance Stairs-Rails: Can reach both Entrance Stairs-Number of Steps: 2 Home Layout: One level Home Equipment: Walker - 2 wheels;Walker - 4 wheels;Cane - single point;Bedside commode;Shower seat      Prior Function Level of Independence: Needs assistance   Gait / Transfers Assistance Needed: Assist from daughter for all mobility, household activities/chores as needed.  Ambulatory with SPC vs. RW "depending on how I feel".  Has home O2, but denies wearing it on a constant, regular basis           Hand Dominance  Extremity/Trunk Assessment   Upper Extremity Assessment: Overall WFL for tasks assessed           Lower Extremity Assessment: Overall WFL for tasks assessed         Communication   Communication: No difficulties  Cognition Arousal/Alertness: Awake/alert Behavior During Therapy: WFL for tasks assessed/performed Overall Cognitive Status: Within Functional  Limits for tasks assessed                      General Comments      Exercises Other Exercises Other Exercises: Toilet transfer, ambulatory with RW, cga/min assist from lower surface height; standing balance with RW for clothing management, hand hygiene at sink, cga/close sup. Functional reach approx 3-4" outside immediate BOS.  (8 minutes)      Assessment/Plan    PT Assessment Patient needs continued PT services  PT Diagnosis Generalized weakness;Difficulty walking   PT Problem List Decreased strength;Decreased activity tolerance;Decreased balance;Decreased mobility;Cardiopulmonary status limiting activity  PT Treatment Interventions DME instruction;Stair training;Gait training;Functional mobility training;Therapeutic activities;Therapeutic exercise;Balance training;Patient/family education   PT Goals (Current goals can be found in the Care Plan section) Acute Rehab PT Goals Patient Stated Goal: "I want to go home, I just don't know it I'm ready today" PT Goal Formulation: With patient Time For Goal Achievement: 04/27/15 Potential to Achieve Goals: Good    Frequency Min 2X/week   Barriers to discharge        Co-evaluation               End of Session Equipment Utilized During Treatment: Gait belt Activity Tolerance:  (Patient tolerated mobility without significant difficulty; somewhat self-limiting in performance/participation) Patient left: in chair;with call bell/phone within reach;with chair alarm set Nurse Communication: Mobility status (DC recs)         Time: 7591-6384 PT Time Calculation (min) (ACUTE ONLY): 24 min   Charges:     PT Treatments $Therapeutic Activity: 8-22 mins   PT G Codes:       Cadey Bazile H. Manson Passey, PT, DPT, NCS 04/13/2015, 1:25 PM [phone]

## 2015-04-15 ENCOUNTER — Inpatient Hospital Stay: Payer: Medicare Other | Attending: Family Medicine

## 2015-04-15 ENCOUNTER — Inpatient Hospital Stay (HOSPITAL_BASED_OUTPATIENT_CLINIC_OR_DEPARTMENT_OTHER): Payer: Medicare Other | Admitting: Family Medicine

## 2015-04-15 VITALS — BP 123/55 | HR 58 | Temp 95.1°F | Wt 166.5 lb

## 2015-04-15 DIAGNOSIS — E119 Type 2 diabetes mellitus without complications: Secondary | ICD-10-CM

## 2015-04-15 DIAGNOSIS — E785 Hyperlipidemia, unspecified: Secondary | ICD-10-CM | POA: Insufficient documentation

## 2015-04-15 DIAGNOSIS — I10 Essential (primary) hypertension: Secondary | ICD-10-CM | POA: Diagnosis not present

## 2015-04-15 DIAGNOSIS — K449 Diaphragmatic hernia without obstruction or gangrene: Secondary | ICD-10-CM | POA: Diagnosis not present

## 2015-04-15 DIAGNOSIS — K219 Gastro-esophageal reflux disease without esophagitis: Secondary | ICD-10-CM

## 2015-04-15 DIAGNOSIS — Z8673 Personal history of transient ischemic attack (TIA), and cerebral infarction without residual deficits: Secondary | ICD-10-CM

## 2015-04-15 DIAGNOSIS — D508 Other iron deficiency anemias: Secondary | ICD-10-CM

## 2015-04-15 DIAGNOSIS — R0602 Shortness of breath: Secondary | ICD-10-CM | POA: Diagnosis not present

## 2015-04-15 DIAGNOSIS — J449 Chronic obstructive pulmonary disease, unspecified: Secondary | ICD-10-CM | POA: Diagnosis not present

## 2015-04-15 DIAGNOSIS — Z8781 Personal history of (healed) traumatic fracture: Secondary | ICD-10-CM | POA: Diagnosis not present

## 2015-04-15 DIAGNOSIS — R531 Weakness: Secondary | ICD-10-CM

## 2015-04-15 DIAGNOSIS — I341 Nonrheumatic mitral (valve) prolapse: Secondary | ICD-10-CM | POA: Diagnosis not present

## 2015-04-15 DIAGNOSIS — M1990 Unspecified osteoarthritis, unspecified site: Secondary | ICD-10-CM | POA: Insufficient documentation

## 2015-04-15 DIAGNOSIS — I2510 Atherosclerotic heart disease of native coronary artery without angina pectoris: Secondary | ICD-10-CM | POA: Insufficient documentation

## 2015-04-15 DIAGNOSIS — J42 Unspecified chronic bronchitis: Secondary | ICD-10-CM | POA: Insufficient documentation

## 2015-04-15 DIAGNOSIS — D509 Iron deficiency anemia, unspecified: Secondary | ICD-10-CM | POA: Insufficient documentation

## 2015-04-15 DIAGNOSIS — M818 Other osteoporosis without current pathological fracture: Secondary | ICD-10-CM

## 2015-04-15 DIAGNOSIS — Z8701 Personal history of pneumonia (recurrent): Secondary | ICD-10-CM

## 2015-04-15 DIAGNOSIS — R5383 Other fatigue: Secondary | ICD-10-CM | POA: Diagnosis not present

## 2015-04-15 MED ORDER — SODIUM CHLORIDE 0.9 % IV SOLN
510.0000 mg | Freq: Once | INTRAVENOUS | Status: AC
  Administered 2015-04-15: 510 mg via INTRAVENOUS
  Filled 2015-04-15: qty 17

## 2015-04-18 ENCOUNTER — Telehealth: Payer: Self-pay | Admitting: Cardiovascular Disease

## 2015-04-18 NOTE — Telephone Encounter (Signed)
Spoke w/ pt.  She reports that her BP has been running low since hospital d/c. Reports yesterday 149/62, 97/55, 95/55. Reports that she only takes the nitro as needed for "pain around my heart", but does not take them very much. Discussed w/ pt and Debbie the purpose of nitro and how it works.  Pt is agreeable to checking BP before taking any more nitro. Advised her to increase her fluids a bit and to lie down w/ her feet elevated when her pressures drop.  She verbalizes understanding, but Eunice Blase is concerned that this is an ongoing issue w/ pt.  She would like to know if any of pt's meds can be adjusted to prevent her hypotensive episodes.  Please advise.  Thank you.

## 2015-04-18 NOTE — Telephone Encounter (Signed)
Only blood pressure pill we had on her lists was the bystolic? Does she restart other home medications Perhaps these could be decreased to avoid hypotension Need to clarify medication list with her

## 2015-04-18 NOTE — Telephone Encounter (Signed)
Pt c/o of Chest Pain: STAT if CP now or developed within 24 hours  1. Are you having CP right now? Yes  2. Are you experiencing any other symptoms (ex. SOB, nausea, vomiting, sweating)? Also complains of left arm pain and low blood pressure even after bp meds have been reduced  3. How long have you been experiencing CP?  Sunday night - could feel heart beating through skin   4. Is your CP continuous or coming and going?   5. Have you taken Nitroglycerin? Yes, relieves pain  ?

## 2015-04-19 ENCOUNTER — Encounter: Payer: Medicare Other | Attending: Cardiovascular Disease | Admitting: Respiratory Therapy

## 2015-04-19 ENCOUNTER — Encounter: Payer: Self-pay | Admitting: Respiratory Therapy

## 2015-04-19 ENCOUNTER — Encounter: Payer: Self-pay | Admitting: Internal Medicine

## 2015-04-19 VITALS — Ht 60.0 in | Wt 165.0 lb

## 2015-04-19 DIAGNOSIS — J449 Chronic obstructive pulmonary disease, unspecified: Secondary | ICD-10-CM | POA: Insufficient documentation

## 2015-04-19 DIAGNOSIS — I5030 Unspecified diastolic (congestive) heart failure: Secondary | ICD-10-CM

## 2015-04-19 NOTE — Patient Instructions (Addendum)
Patient Instructions  Patient Details  Name: Catherine Holmes MRN: [id number] Date of Birth: 1928-03-10 Referring Provider:  Antonieta Iba, MD  Below are the personal goals you chose as well as exercise and nutrition goals. Our goal is to help you keep on track towards obtaining and maintaining your goals. We will be discussing your progress on these goals with you throughout the program.  Initial Exercise Prescription:   Exercise Goals: Frequency: Be able to perform aerobic exercise three times per week working toward 3-5 days per week.  Intensity: Work with a perceived exertion of 11 (fairly light) - 15 (hard) as tolerated. Follow your new exercise prescription and watch for changes in prescription as you progress with the program. Changes will be reviewed with you when they are made.  Duration: You should be able to do 30 minutes of continuous aerobic exercise in addition to a 5 minute warm-up and a 5 minute cool-down routine.  Nutrition Goals: Your personal nutrition goals will be established when you do your nutrition analysis with the dietician.  The following are nutrition guidelines to follow: Cholesterol < 200mg /day Sodium < 1500mg /day Fiber: Women over 50 yrs - 21 grams per day  Personal Goals:     Personal Goals and Risk Factors at Admission - 04/19/15 1130    Personal Goals and Risk Factors on Admission    Weight Management Yes   Intervention Learn and follow the exercise and diet guidelines while in the program. Utilize the nutrition and education classes to help gain knowledge of the diet and exercise expectations in the program  Ms Catherine Holmes would like to meet with the dietitian.  Her goal is to lose 15lbs. She lives with her daughter and they do eat alot of fruits and vegetables; no salt.   Admit Weight 165 lb (74.844 kg)   Goal Weight 150 lb (68.04 kg)   Increase Aerobic Exercise and Physical Activity Yes   Intervention While in program, learn and follow the  exercise prescription taught. Start at a low level workload and increase workload after able to maintain previous level for 30 minutes. Increase time before increasing intensity.  Ms Catherine Holmes does have a pedal machine. She would like to strength herself where she is not so dependent on her cane.   Understand more about Heart/Pulmonary Disease. Yes   Intervention While in program utilize professionals for any questions, and attend the education sessions. Great websites to use are www.americanheart.org or www.lung.org for reliable information.  Ms Catherine Holmes has COPD and CHF and is interested in learning more to manage her diseases.   Improve shortness of breath with ADL's Yes   Intervention While in program, learn and follow the exercise prescription taught. Start at a low level workload and increase workload ad advised by the exercise physiologist. Increase time before increasing intensity.  Ms Catherine Holmes scored a 19 on the [employer] SOB Questionnaire. She would like to do more activity without shortness of breath.   Develop more efficient breathing techniques such as purse lipped breathing and diaphragmatic breathing; and practicing self-pacing with activity Yes   Intervention While in program, learn and utilize the specific breathing techniques taught to you. Continue to practice and use the techniques as needed.   Increase knowledge of respiratory medications and ability to use respiratory devices properly.  Yes   Intervention While in program learn and demonstrate appropriate use of your oxygen therapy by increasing flow with exertion, manage oxygen tank operation, including continuous and intermittent flow.  Understanding oxygen  is a drug ordered by your physician.;While in program, learn to administer MDI, nebulizer, and spacer properly.;Learn to take respiratory medicine as ordered.;While in program, learn to Clean MDI, nebulizers, and spacers properly.  Ms Catherine Holmes has a good understanding of her Annie SableSpiriva, Dulera,  ProAir, Spacer, and Albuterol  nebulizer.  She uses a gas portable O2 tank, 2l/m [place], and her service is from [facility].   Diabetes Yes   Hypertension Yes   Lipids Yes      Tobacco Use Initial Evaluation: History  Smoking status  . Former Smoker -- 0.50 packs/day for 30 years  . Types: Cigarettes  . Quit date: 10/22/1994  Smokeless tobacco  . Never Used    Copy of goals given to participant. Patient Instructions  Patient Details  Name: Catherine Holmes MRN: [id number] Date of Birth: [birth year] Referring Provider:  Antonieta IbaGollan, Timothy J, MD  Below are the personal goals you chose as well as exercise and nutrition goals. Our goal is to help you keep on track towards obtaining and maintaining your goals. We will be discussing your progress on these goals with you throughout the program.  Initial Exercise Prescription:     Initial Exercise Prescription - 04/19/15 1500    Date of Initial Exercise Prescription   Date 04/19/15   Treadmill   MPH 1   Grade 0   Minutes 10   Recumbant Bike   Level 1   RPM 40   Watts 20   Minutes 10   NuStep   Level 2   Watts 40   Minutes 10   Arm Ergometer   Level 1   Watts 10   Minutes 10   REL-XR   Level 1   Watts 40   Minutes 10   Prescription Details   Frequency (times per week) 3   Duration Progress to 30 minutes of continuous aerobic without signs/symptoms of physical distress   Intensity   THRR REST +  30   Ratings of Perceived Exertion 11-15   Perceived Dyspnea 2-4   Progression Continue progressive overload as per policy without signs/symptoms or physical distress.   Resistance Training   Training Prescription Yes   Weight 1   Reps 10-12      Exercise Goals: Frequency: Be able to perform aerobic exercise three times per week working toward 3-5 days per week.  Intensity: Work with a perceived exertion of 11 (fairly light) - 15 (hard) as tolerated. Follow your new exercise prescription and watch for changes in  prescription as you progress with the program. Changes will be reviewed with you when they are made.  Duration: You should be able to do 30 minutes of continuous aerobic exercise in addition to a 5 minute warm-up and a 5 minute cool-down routine.  Nutrition Goals: Your personal nutrition goals will be established when you do your nutrition analysis with the dietician.  The following are nutrition guidelines to follow: Cholesterol < 200mg /day Sodium < 1500mg /day Fiber: Women over 50 yrs - 21 grams per day  Personal Goals:     Personal Goals and Risk Factors at Admission - 04/19/15 1130    Personal Goals and Risk Factors on Admission    Weight Management Yes   Intervention Learn and follow the exercise and diet guidelines while in the program. Utilize the nutrition and education classes to help gain knowledge of the diet and exercise expectations in the program  Ms Daughtridge would like to meet with the dietitian.  Her goal  is to lose 15lbs. She lives with her daughter and they do eat alot of fruits and vegetables; no salt.   Admit Weight 165 lb (74.844 kg)   Goal Weight 150 lb (68.04 kg)   Increase Aerobic Exercise and Physical Activity Yes   Intervention While in program, learn and follow the exercise prescription taught. Start at a low level workload and increase workload after able to maintain previous level for 30 minutes. Increase time before increasing intensity.  Ms Felten does have a pedal machine. She would like to strength herself where she is not so dependent on her cane.   Understand more about Heart/Pulmonary Disease. Yes   Intervention While in program utilize professionals for any questions, and attend the education sessions. Great websites to use are www.americanheart.org or www.lung.org for reliable information.  Ms Thurow has COPD and CHF and is interested in learning more to manage her diseases.   Improve shortness of breath with ADL's Yes   Intervention While in program,  learn and follow the exercise prescription taught. Start at a low level workload and increase workload ad advised by the exercise physiologist. Increase time before increasing intensity.  Ms Cegielski scored a 18 on the [employer] SOB Questionnaire. She would like to do more activity without shortness of breath.   Develop more efficient breathing techniques such as purse lipped breathing and diaphragmatic breathing; and practicing self-pacing with activity Yes   Intervention While in program, learn and utilize the specific breathing techniques taught to you. Continue to practice and use the techniques as needed.   Increase knowledge of respiratory medications and ability to use respiratory devices properly.  Yes   Intervention While in program learn and demonstrate appropriate use of your oxygen therapy by increasing flow with exertion, manage oxygen tank operation, including continuous and intermittent flow.  Understanding oxygen is a drug ordered by your physician.;While in program, learn to administer MDI, nebulizer, and spacer properly.;Learn to take respiratory medicine as ordered.;While in program, learn to Clean MDI, nebulizers, and spacers properly.  Ms Oates has a good understanding of her Annie Sable, ProAir, Spacer, and Albuterol  nebulizer.  She uses a gas portable O2 tank, 2l/m [place], and her service is from [facility].   Diabetes Yes   Hypertension Yes   Lipids Yes      Tobacco Use Initial Evaluation: History  Smoking status  . Former Smoker -- 0.50 packs/day for 30 years  . Types: Cigarettes  . Quit date: 10/22/1994  Smokeless tobacco  . Never Used    Copy of goals given to participant.

## 2015-04-19 NOTE — Telephone Encounter (Signed)
Left message for Catherine Holmes to call back.

## 2015-04-19 NOTE — Progress Notes (Signed)
Pulmonary Individual Treatment Plan  Patient Details  Name: Catherine Holmes MRN: [id number] Date of Birth: Nov 10, 1927 Referring Provider:  Dr Julien Nordmann  Initial Encounter Date: 04/19/15  Visit Diagnosis: COPD (chronic obstructive pulmonary disease) with chronic bronchitis  Diastolic congestive heart failure, unspecified congestive heart failure chronicity  Patient's Home Medications on Admission:  Current outpatient prescriptions:    albuterol (PROAIR HFA) 108 (90 BASE) MCG/ACT inhaler, Inhale 2 puffs into the lungs every 6 (six) hours as needed., Disp: 3 Inhaler, Rfl: 0   albuterol (PROVENTIL) (2.5 MG/3ML) 0.083% nebulizer solution, Take 2.5 mg by nebulization every 6 (six) hours as needed for wheezing or shortness of breath. , Disp: , Rfl:    arformoterol (BROVANA) 15 MCG/2ML NEBU, Take 2 mLs (15 mcg total) by nebulization 2 (two) times daily., Disp: 120 mL, Rfl: 4   aspirin EC 81 MG tablet, Take 81 mg by mouth daily., Disp: , Rfl:    BETA CAROTENE PO, Take by mouth 2 (two) times daily., Disp: , Rfl:    beta carotene w/minerals (OCUVITE) tablet, Take 1 tablet by mouth 2 (two) times daily., Disp: , Rfl:    bumetanide (BUMEX) 1 MG tablet, Take 1 tablet (1 mg total) by mouth daily. Take 1 tab daily, Disp: 180 tablet, Rfl: 3   calcium carbonate (OS-CAL) 600 MG TABS tablet, Take 600 mg by mouth daily with breakfast., Disp: , Rfl:    Cholecalciferol (VITAMIN D-3 PO), Take 1,000 Units by mouth., Disp: , Rfl:    citalopram (CELEXA) 20 MG tablet, Take 20 mg by mouth daily., Disp: , Rfl:    diphenhydramine-acetaminophen (TYLENOL PM) 25-500 MG TABS, Take 1 tablet by mouth at bedtime as needed., Disp: , Rfl:    erythromycin ophthalmic ointment, Place 1 application into both eyes at bedtime., Disp: , Rfl:    ezetimibe (ZETIA) 10 MG tablet, Take 1 tablet (10 mg total) by mouth daily., Disp: 90 tablet, Rfl: 3   fluticasone (FLONASE) 50 MCG/ACT nasal spray, Place 1 spray into both  nostrils daily., Disp: , Rfl:    gabapentin (NEURONTIN) 100 MG capsule, Take 100 mg by mouth 2 (two) times daily. , Disp: , Rfl:    HYDROcodone-acetaminophen (NORCO/VICODIN) 5-325 MG per tablet, Take 1 tablet by mouth as needed for severe pain. , Disp: , Rfl:    mometasone-formoterol (DULERA) 100-5 MCG/ACT AERO, Inhale 2 puffs into the lungs 2 (two) times daily. , Disp: , Rfl:    Multiple Vitamin (MULTIVITAMIN) tablet, Take 1 tablet by mouth daily.  , Disp: , Rfl:    nebivolol (BYSTOLIC) 5 MG tablet, Take 1 tablet (5 mg total) by mouth daily., Disp: 30 tablet, Rfl: 0   nitroGLYCERIN (NITROSTAT) 0.4 MG SL tablet, Place 1 tablet (0.4 mg total) under the tongue every 5 (five) minutes as needed for chest pain., Disp: 25 tablet, Rfl: 6   potassium chloride (K-DUR,KLOR-CON) 10 MEQ tablet, Take 5 mEq by mouth 2 (two) times daily as needed. Patient takes this when she takes Bumex., Disp: , Rfl:    predniSONE (STERAPRED UNI-PAK 21 TAB) 10 MG (21) TBPK tablet, Take 1 tablet (10 mg total) by mouth daily. 6 tabs PO x 1 day 5 tabs PO x 1 day 4 tabs PO x 1 day 3 tabs PO x 1 day 2 tabs PO x 1 day 1 tab PO x 1 day and stop, Disp: 21 tablet, Rfl: 0   tiotropium (SPIRIVA) 18 MCG inhalation capsule, Place 1 capsule (18 mcg total) into inhaler and  inhale daily., Disp: 90 capsule, Rfl: 0   triamcinolone (KENALOG) 0.025 % cream, Apply 1 application topically daily as needed (for arms)., Disp: , Rfl:    trimethoprim-polymyxin b (POLYTRIM) ophthalmic solution, Place 1 drop into both eyes 2 (two) times daily., Disp: , Rfl:   Past Medical History: Past Medical History  Diagnosis Date   Essential hypertension    Diabetes mellitus type II, controlled    COPD (chronic obstructive pulmonary disease)    Osteoporosis    Carotid arterial disease     a. 2012 Carotid dopplers: 40-59% R ICA, 60-79% L ICA   T[place]    Arthralgia    Sjoegren syndrome    TIA (transient ischemic attack)    Emphysema     MVP (mitral valve prolapse)    Chronic diastolic CHF (congestive heart failure)     a. 08/2011 Echo: EF 65-70%, mild LVH, mod dil LA, mildly dil RA, mild MR, mild-mod AoV Sclerosis w/o stenosis, mod-sev elev PA pressures, mild TR.   Acid reflux    Diverticulitis    Anemia    History of pneumonia    Syncope and collapse    Anemia    Hip fracture, left    Hyperlipidemia    Hiatal hernia    Chest pain     a. 08/2011 MV: EF 74%, no ischemia.   Noncompliance     a. h/o not taking bumex as Rx.   PAF (paroxysmal atrial fibrillation)     a. CHA2DS2VASc = 8 ->No OAC 2/2 falls.    Tobacco Use: History  Smoking status   Former Smoker -- 0.50 packs/day for 30 years   Types: Cigarettes   Quit date: 10/22/1994  Smokeless tobacco   Never Used    Labs: Recent Review Flowsheet Data    There is no flowsheet data to display.       ADL [facility]:     ADL [facility]      04/19/15 1305       ADL [facility]   ADL Phase Entry     SOB Score total 92     Rest 1     Walk 4     Stairs 5     Bath 4     Dress 5     Shop 5         Pulmonary Function Assessment:     Pulmonary Function Assessment - 04/19/15 1130    Pulmonary Function Tests   FVC% 36 %   FEV1% 35 %   FEV1/FVC Ratio 71.7   Breath   Bilateral Breath Sounds Clear;Decreased   Shortness of Breath Yes      Exercise Target Goals:    Exercise Program Goal: Individual exercise prescription set with THRR, safety & activity barriers. Participant demonstrates ability to understand and report RPE using BORG scale, to self-measure pulse accurately, and to acknowledge the importance of the exercise prescription.  Exercise Prescription Goal: Starting with aerobic activity 30 plus minutes a day, 3 days per week for initial exercise prescription. Provide home exercise prescription and guidelines that participant acknowledges understanding prior to discharge.  Activity Barriers & Risk Stratification:     Activity  Barriers & Risk Stratification - 04/19/15 1130    Activity Barriers & Risk Stratification   Activity Barriers Shortness of Breath;Balance Concerns;Deconditioning;Assistive Device   Risk Stratification Moderate      6 Minute Walk:     6 Minute Walk      04/19/15 1420  6 Minute Walk   Phase Initial     Distance 180 feet     Walk Time 3 minutes     Resting HR 67 bpm     Resting BP 122/70 mmHg     Max Ex. HR 82 bpm     Max Ex. BP 132/74 mmHg     RPE 15     Perceived Dyspnea  4     Symptoms No        Initial Exercise Prescription:     Initial Exercise Prescription - 04/19/15 1500    Date of Initial Exercise Prescription   Date 04/19/15   Treadmill   MPH 1   Grade 0   Minutes 10   Recumbant Bike   Level 1   RPM 40   Watts 20   Minutes 10   NuStep   Level 2   Watts 40   Minutes 10   Arm Ergometer   Level 1   Watts 10   Minutes 10   REL-XR   Level 1   Watts 40   Minutes 10   Prescription Details   Frequency (times per week) 3   Duration Progress to 30 minutes of continuous aerobic without signs/symptoms of physical distress   Intensity   THRR REST +  30   Ratings of Perceived Exertion 11-15   Perceived Dyspnea 2-4   Progression Continue progressive overload as per policy without signs/symptoms or physical distress.   Resistance Training   Training Prescription Yes   Weight 1   Reps 10-12      Exercise Prescription Changes:   Discharge Exercise Prescription (Final Exercise Prescription Changes):    Nutrition:  Target Goals: Understanding of nutrition guidelines, daily intake of sodium 1500mg , cholesterol 200mg , calories 30% from fat and 7% or less from saturated fats, daily to have 5 or more servings of fruits and vegetables.  Biometrics:     Pre Biometrics - 04/19/15 1424    Pre Biometrics   Height 5' (1.524 m)   Weight 165 lb (74.844 kg)   Waist Circumference 41 inches   Hip Circumference 45 inches   Waist to Hip Ratio 0.91 %    BMI (Calculated) 32.3       Nutrition Therapy Plan and Nutrition Goals:     Nutrition Therapy & Goals - 04/19/15 1312    Nutrition Therapy   Diet Ms Wollen would like to meet with the dietitian. her goal is to lose 15lbs.      Nutrition Discharge: Rate Your Plate Scores:   Psychosocial: Target Goals: Acknowledge presence or absence of depression, maximize coping skills, provide positive support system. Participant is able to verbalize types and ability to use techniques and skills needed for reducing stress and depression.  Initial Review & Psychosocial Screening:   Quality of Life Scores:   PHQ-9:     Recent Review Flowsheet Data    There is no flowsheet data to display.      Psychosocial Evaluation and Intervention:   Psychosocial Re-Evaluation:  Education: Education Goals: Education classes will be provided on a weekly basis, covering required topics. Participant will state understanding/return demonstration of topics presented.  Learning Barriers/Preferences:     Learning Barriers/Preferences - 04/19/15 1130    Learning Barriers/Preferences   Learning Barriers None   Learning Preferences Group Instruction;Individual Instruction;Pictoral;Skilled Demonstration;Written Material;Video;Verbal Instruction      Education Topics: Initial Evaluation Education: - Verbal, written and demonstration of respiratory meds, RPE/PD scales, oximetry and breathing techniques.  Instruction on use of nebulizers and MDIs: cleaning and proper use, rinsing mouth with steroid doses and importance of monitoring MDI activations.   General Nutrition Guidelines/Fats and Fiber: -Group instruction provided by verbal, written material, models and posters to present the general guidelines for heart healthy nutrition. Gives an explanation and review of dietary fats and fiber.   Controlling Sodium/Reading Food Labels: -Group verbal and written material supporting the discussion of sodium  use in heart healthy nutrition. Review and explanation with models, verbal and written materials for utilization of the food label.   Exercise Physiology & Risk Factors: - Group verbal and written instruction with models to review the exercise physiology of the cardiovascular system and associated critical values. Details cardiovascular disease risk factors and the goals associated with each risk factor.   Aerobic Exercise & Resistance Training: - Gives group verbal and written discussion on the health impact of inactivity. On the components of aerobic and resistive training programs and the benefits of this training and how to safely progress through these programs.   Flexibility, Balance, General Exercise Guidelines: - Provides group verbal and written instruction on the benefits of flexibility and balance training programs. Provides general exercise guidelines with specific guidelines to those with heart or lung disease. Demonstration and skill practice provided.   Stress Management: - Provides group verbal and written instruction about the health risks of elevated stress, cause of high stress, and healthy ways to reduce stress.   Depression: - Provides group verbal and written instruction on the correlation between heart/lung disease and depressed mood, treatment options, and the stigmas associated with seeking treatment.   Exercise & Equipment Safety: - Individual verbal instruction and demonstration of equipment use and safety with use of the equipment.   Infection Prevention: - Provides verbal and written material to individual with discussion of infection control including proper hand washing and proper equipment cleaning during exercise session.   Falls Prevention: - Provides verbal and written material to individual with discussion of falls prevention and safety.   Diabetes: - Individual verbal and written instruction to review signs/symptoms of diabetes, desired ranges  of glucose level fasting, after meals and with exercise. Advice that pre and post exercise glucose checks will be done for 3 sessions at entry of program.   Chronic Lung Diseases: - Group verbal and written instruction to review new updates, new respiratory medications, new advancements in procedures and treatments. Provide informative websites and "800" numbers of self-education.   Lung Procedures: - Group verbal and written instruction to describe testing methods done to diagnose lung disease. Review the outcome of test results. Describe the treatment choices: Pulmonary Function Tests, ABGs and oximetry.   Energy Conservation: - Provide group verbal and written instruction for methods to conserve energy, plan and organize activities. Instruct on pacing techniques, use of adaptive equipment and posture/positioning to relieve shortness of breath.   Triggers: - Group verbal and written instruction to review types of environmental controls: home humidity, furnaces, filters, dust mite/pet prevention, HEPA vacuums. To discuss weather changes, air quality and the benefits of nasal washing.   Exacerbations: - Group verbal and written instruction to provide: warning signs, infection symptoms, calling MD promptly, preventive modes, and value of vaccinations. Review: effective airway clearance, coughing and/or vibration techniques. Create an Sport and exercise psychologistAction Plan.   Oxygen: - Individual and group verbal and written instruction on oxygen therapy. Includes supplement oxygen, available portable oxygen systems, continuous and intermittent flow rates, oxygen safety, concentrators, and Medicare reimbursement for oxygen.  Respiratory Medications: - Group verbal and written instruction to review medications for lung disease. Drug class, frequency, complications, importance of spacers, rinsing mouth after steroid MDI's, and proper cleaning methods for nebulizers.   AED/CPR: - Group verbal and written  instruction with the use of models to demonstrate the basic use of the AED with the basic ABC's of resuscitation.   Breathing Retraining: - Provides individuals verbal and written instruction on purpose, frequency, and proper technique of diaphragmatic breathing and pursed-lipped breathing. Applies individual practice skills.   Anatomy and Physiology of the Lungs: - Group verbal and written instruction with the use of models to provide basic lung anatomy and physiology related to function, structure and complications of lung disease.   Heart Failure: - Group verbal and written instruction on the basics of heart failure: signs/symptoms, treatments, explanation of ejection fraction, enlarged heart and cardiomyopathy.   Sleep Apnea: - Individual verbal and written instruction to review Obstructive Sleep Apnea. Review of risk factors, methods for diagnosing and types of masks and machines for OSA.   Anxiety: - Provides group, verbal and written instruction on the correlation between heart/lung disease and anxiety, treatment options, and management of anxiety.   Relaxation: - Provides group, verbal and written instruction about the benefits of relaxation for patients with heart/lung disease. Also provides patients with examples of relaxation techniques.   Knowledge Questionnaire Score:     Knowledge Questionnaire Score - 04/19/15 1130    Knowledge Questionnaire Score   Pre Score -3      Personal Goals and Risk Factors at Admission:     Personal Goals and Risk Factors at Admission - 04/19/15 1130    Personal Goals and Risk Factors on Admission    Weight Management Yes   Intervention Learn and follow the exercise and diet guidelines while in the program. Utilize the nutrition and education classes to help gain knowledge of the diet and exercise expectations in the program  Ms Brizzi would like to meet with the dietitian.  Her goal is to lose 15lbs. She lives with her daughter and  they do eat alot of fruits and vegetables; no salt.   Admit Weight 165 lb (74.844 kg)   Goal Weight 150 lb (68.04 kg)   Increase Aerobic Exercise and Physical Activity Yes   Intervention While in program, learn and follow the exercise prescription taught. Start at a low level workload and increase workload after able to maintain previous level for 30 minutes. Increase time before increasing intensity.  Ms Caperton does have a pedal machine. She would like to strength herself where she is not so dependent on her cane.   Understand more about Heart/Pulmonary Disease. Yes   Intervention While in program utilize professionals for any questions, and attend the education sessions. Great websites to use are www.americanheart.org or www.lung.org for reliable information.  Ms Parthasarathy has COPD and CHF and is interested in learning more to manage her diseases.   Improve shortness of breath with ADL's Yes   Intervention While in program, learn and follow the exercise prescription taught. Start at a low level workload and increase workload ad advised by the exercise physiologist. Increase time before increasing intensity.  Ms Mervin scored a 82 on the [facility] SOB Questionnaire. She would like to do more activity without shortness of breath.   Develop more efficient breathing techniques such as purse lipped breathing and diaphragmatic breathing; and practicing self-pacing with activity Yes   Intervention While in program, learn and utilize the specific breathing  techniques taught to you. Continue to practice and use the techniques as needed.   Increase knowledge of respiratory medications and ability to use respiratory devices properly.  Yes   Intervention While in program learn and demonstrate appropriate use of your oxygen therapy by increasing flow with exertion, manage oxygen tank operation, including continuous and intermittent flow.  Understanding oxygen is a drug ordered by your physician.;While in program, learn  to administer MDI, nebulizer, and spacer properly.;Learn to take respiratory medicine as ordered.;While in program, learn to Clean MDI, nebulizers, and spacers properly.  Ms Laday has a good understanding of her Annie Sable, ProAir, Spacer, and Albuterol  nebulizer.  She uses a gas portable O2 tank, 2l/m , and her service is from [facility].   Diabetes Yes   Hypertension Yes   Lipids Yes      Personal Goals and Risk Factors Review:    Personal Goals Discharge:    Comments: Ms Valade plans to start [facility] on 04/27/15 and attend 3d/wk.

## 2015-04-19 NOTE — Progress Notes (Signed)
Pulmonary Individual Treatment Plan  Patient Details  Name: Catherine Holmes MRN: [id number] Date of Birth: 29-Apr-1928 Referring Provider:  Antonieta Iba, MD  Initial Encounter Date: Date: 04/19/15  Visit Diagnosis: COPD (chronic obstructive pulmonary disease) with chronic bronchitis - Plan: PULMONARY REHAB 30 DAY REVIEW  Diastolic congestive heart failure, unspecified congestive heart failure chronicity - Plan: PULMONARY REHAB 30 DAY REVIEW  Patient's Home Medications on Admission:  Current outpatient prescriptions:  .  albuterol (PROAIR HFA) 108 (90 BASE) MCG/ACT inhaler, Inhale 2 puffs into the lungs every 6 (six) hours as needed., Disp: 3 Inhaler, Rfl: 0 .  albuterol (PROVENTIL) (2.5 MG/3ML) 0.083% nebulizer solution, Take 2.5 mg by nebulization every 6 (six) hours as needed for wheezing or shortness of breath. , Disp: , Rfl:  .  arformoterol (BROVANA) 15 MCG/2ML NEBU, Take 2 mLs (15 mcg total) by nebulization 2 (two) times daily., Disp: 120 mL, Rfl: 4 .  aspirin EC 81 MG tablet, Take 81 mg by mouth daily., Disp: , Rfl:  .  BETA CAROTENE PO, Take by mouth 2 (two) times daily., Disp: , Rfl:  .  beta carotene w/minerals (OCUVITE) tablet, Take 1 tablet by mouth 2 (two) times daily., Disp: , Rfl:  .  bumetanide (BUMEX) 1 MG tablet, Take 1 tablet (1 mg total) by mouth daily. Take 1 tab daily, Disp: 180 tablet, Rfl: 3 .  calcium carbonate (OS-CAL) 600 MG TABS tablet, Take 600 mg by mouth daily with breakfast., Disp: , Rfl:  .  Cholecalciferol (VITAMIN D-3 PO), Take 1,000 Units by mouth., Disp: , Rfl:  .  citalopram (CELEXA) 20 MG tablet, Take 20 mg by mouth daily., Disp: , Rfl:  .  diphenhydramine-acetaminophen (TYLENOL PM) 25-500 MG TABS, Take 1 tablet by mouth at bedtime as needed., Disp: , Rfl:  .  erythromycin ophthalmic ointment, Place 1 application into both eyes at bedtime., Disp: , Rfl:  .  ezetimibe (ZETIA) 10 MG tablet, Take 1 tablet (10 mg total) by mouth daily., Disp: 90  tablet, Rfl: 3 .  fluticasone (FLONASE) 50 MCG/ACT nasal spray, Place 1 spray into both nostrils daily., Disp: , Rfl:  .  gabapentin (NEURONTIN) 100 MG capsule, Take 100 mg by mouth 2 (two) times daily. , Disp: , Rfl:  .  HYDROcodone-acetaminophen (NORCO/VICODIN) 5-325 MG per tablet, Take 1 tablet by mouth as needed for severe pain. , Disp: , Rfl:  .  mometasone-formoterol (DULERA) 100-5 MCG/ACT AERO, Inhale 2 puffs into the lungs 2 (two) times daily. , Disp: , Rfl:  .  Multiple Vitamin (MULTIVITAMIN) tablet, Take 1 tablet by mouth daily.  , Disp: , Rfl:  .  nebivolol (BYSTOLIC) 5 MG tablet, Take 1 tablet (5 mg total) by mouth daily., Disp: 30 tablet, Rfl: 0 .  nitroGLYCERIN (NITROSTAT) 0.4 MG SL tablet, Place 1 tablet (0.4 mg total) under the tongue every 5 (five) minutes as needed for chest pain., Disp: 25 tablet, Rfl: 6 .  potassium chloride (K-DUR,KLOR-CON) 10 MEQ tablet, Take 5 mEq by mouth 2 (two) times daily as needed. Patient takes this when she takes Bumex., Disp: , Rfl:  .  predniSONE (STERAPRED UNI-PAK 21 TAB) 10 MG (21) TBPK tablet, Take 1 tablet (10 mg total) by mouth daily. 6 tabs PO x 1 day 5 tabs PO x 1 day 4 tabs PO x 1 day 3 tabs PO x 1 day 2 tabs PO x 1 day 1 tab PO x 1 day and stop, Disp: 21 tablet, Rfl: 0 .  tiotropium (SPIRIVA) 18 MCG inhalation capsule, Place 1 capsule (18 mcg total) into inhaler and inhale daily., Disp: 90 capsule, Rfl: 0 .  triamcinolone (KENALOG) 0.025 % cream, Apply 1 application topically daily as needed (for arms)., Disp: , Rfl:  .  trimethoprim-polymyxin b (POLYTRIM) ophthalmic solution, Place 1 drop into both eyes 2 (two) times daily., Disp: , Rfl:   Past Medical History: Past Medical History  Diagnosis Date  . Essential hypertension   . Diabetes mellitus type II, controlled   . COPD (chronic obstructive pulmonary disease)   . Osteoporosis   . Carotid arterial disease     a. 2012 Carotid dopplers: 40-59% R ICA, 60-79% L ICA  . Tic [place]   .  Arthralgia   . Sjoegren syndrome   . TIA (transient ischemic attack)   . Emphysema   . MVP (mitral valve prolapse)   . Chronic diastolic CHF (congestive heart failure)     a. 08/2011 Echo: EF 65-70%, mild LVH, mod dil LA, mildly dil RA, mild MR, mild-mod AoV Sclerosis w/o stenosis, mod-sev elev PA pressures, mild TR.  Marland Kitchen Acid reflux   . Diverticulitis   . Anemia   . History of pneumonia   . Syncope and collapse   . Anemia   . Hip fracture, left   . Hyperlipidemia   . Hiatal hernia   . Chest pain     a. 08/2011 MV: EF 74%, no ischemia.  . Noncompliance     a. h/o not taking bumex as Rx.  Marland Kitchen PAF (paroxysmal atrial fibrillation)     a. CHA2DS2VASc = 8 ->No OAC 2/2 falls.    Tobacco Use: History  Smoking status  . Former Smoker -- 0.50 packs/day for 30 years  . Types: Cigarettes  . Quit date: 10/22/1994  Smokeless tobacco  . Never Used    Labs: Recent Review Flowsheet Data    There is no flowsheet data to display.       ADL UCSD:     ADL UCSD      04/19/15 1305       ADL UCSD   ADL Phase Entry     SOB Score total 92     Rest 1     Walk 4     Stairs 5     Bath 4     Dress 5     Shop 5         Pulmonary Function Assessment:     Pulmonary Function Assessment - 04/19/15 1130    Pulmonary Function Tests   FVC% 36 %   FEV1% 35 %   FEV1/FVC Ratio 71.7   Breath   Bilateral Breath Sounds Clear;Decreased   Shortness of Breath Yes      Exercise Target Goals: Date: 04/19/15  Exercise Program Goal: Individual exercise prescription set with THRR, safety & activity barriers. Participant demonstrates ability to understand and report RPE using BORG scale, to self-measure pulse accurately, and to acknowledge the importance of the exercise prescription.  Exercise Prescription Goal: Starting with aerobic activity 30 plus minutes a day, 3 days per week for initial exercise prescription. Provide home exercise prescription and guidelines that participant  acknowledges understanding prior to discharge.  Activity Barriers & Risk Stratification:     Activity Barriers & Risk Stratification - 04/19/15 1130    Activity Barriers & Risk Stratification   Activity Barriers Shortness of Breath;Balance Concerns;Deconditioning;Assistive Device   Risk Stratification Moderate      6 Minute Walk:  6 Minute Walk      04/19/15 1420       6 Minute Walk   Phase Initial     Distance 180 feet     Walk Time 3 minutes     Resting HR 67 bpm     Resting BP 122/70 mmHg     Max Ex. HR 82 bpm     Max Ex. BP 132/74 mmHg     RPE 15     Perceived Dyspnea  4     Symptoms No        Initial Exercise Prescription:     Initial Exercise Prescription - 04/19/15 1500    Date of Initial Exercise Prescription   Date 04/19/15   Treadmill   MPH 1   Grade 0   Minutes 10   Recumbant Bike   Level 1   RPM 40   Watts 20   Minutes 10   NuStep   Level 2   Watts 40   Minutes 10   Arm Ergometer   Level 1   Watts 10   Minutes 10   REL-XR   Level 1   Watts 40   Minutes 10   Prescription Details   Frequency (times per week) 3   Duration Progress to 30 minutes of continuous aerobic without signs/symptoms of physical distress   Intensity   THRR REST +  30   Ratings of Perceived Exertion 11-15   Perceived Dyspnea 2-4   Progression Continue progressive overload as per policy without signs/symptoms or physical distress.   Resistance Training   Training Prescription Yes   Weight 1   Reps 10-12      Exercise Prescription Changes:   Discharge Exercise Prescription (Final Exercise Prescription Changes):    Nutrition:  Target Goals: Understanding of nutrition guidelines, daily intake of sodium 1500mg , cholesterol 200mg , calories 30% from fat and 7% or less from saturated fats, daily to have 5 or more servings of fruits and vegetables.  Biometrics:     Pre Biometrics - 04/19/15 1424    Pre Biometrics   Height 5' (1.524 m)   Weight 165 lb  (74.844 kg)   Waist Circumference 41 inches   Hip Circumference 45 inches   Waist to Hip Ratio 0.91 %   BMI (Calculated) 32.3       Nutrition Therapy Plan and Nutrition Goals:     Nutrition Therapy & Goals - 04/19/15 1312    Nutrition Therapy   Diet Catherine Holmes would like to meet with the dietitian. Catherine Holmes goal is to lose 15lbs.      Nutrition Discharge: Rate Your Plate Scores:   Psychosocial: Target Goals: Acknowledge presence or absence of depression, maximize coping skills, provide positive support system. Participant is able to verbalize types and ability to use techniques and skills needed for reducing stress and depression.  Initial Review & Psychosocial Screening:   Quality of Life Scores:   PHQ-9:     Recent Review Flowsheet Data    There is no flowsheet data to display.      Psychosocial Evaluation and Intervention:   Psychosocial Re-Evaluation:  Education: Education Goals: Education classes will be provided on a weekly basis, covering required topics. Participant will state understanding/return demonstration of topics presented.  Learning Barriers/Preferences:     Learning Barriers/Preferences - 04/19/15 1130    Learning Barriers/Preferences   Learning Barriers None   Learning Preferences Group Instruction;Individual Instruction;Pictoral;Skilled Demonstration;Written Material;Video;Verbal Instruction      Education Topics: Initial  Evaluation Education: - Verbal, written and demonstration of respiratory meds, RPE/PD scales, oximetry and breathing techniques. Instruction on use of nebulizers and MDIs: cleaning and proper use, rinsing mouth with steroid doses and importance of monitoring MDI activations.          Most Recent Value   Date  04/19/15   Educator  LB   Instruction Review Code  2- meets goals/outcomes      General Nutrition Guidelines/Fats and Fiber: -Group instruction provided by verbal, written material, models and posters to present  the general guidelines for heart healthy nutrition. Gives an explanation and review of dietary fats and fiber.   Controlling Sodium/Reading Food Labels: -Group verbal and written material supporting the discussion of sodium use in heart healthy nutrition. Review and explanation with models, verbal and written materials for utilization of the food label.   Exercise Physiology & Risk Factors: - Group verbal and written instruction with models to review the exercise physiology of the cardiovascular system and associated critical values. Details cardiovascular disease risk factors and the goals associated with each risk factor.   Aerobic Exercise & Resistance Training: - Gives group verbal and written discussion on the health impact of inactivity. On the components of aerobic and resistive training programs and the benefits of this training and how to safely progress through these programs.   Flexibility, Balance, General Exercise Guidelines: - Provides group verbal and written instruction on the benefits of flexibility and balance training programs. Provides general exercise guidelines with specific guidelines to those with heart or lung disease. Demonstration and skill practice provided.   Stress Management: - Provides group verbal and written instruction about the health risks of elevated stress, cause of high stress, and healthy ways to reduce stress.   Depression: - Provides group verbal and written instruction on the correlation between heart/lung disease and depressed mood, treatment options, and the stigmas associated with seeking treatment.   Exercise & Equipment Safety: - Individual verbal instruction and demonstration of equipment use and safety with use of the equipment.   Infection Prevention: - Provides verbal and written material to individual with discussion of infection control including proper hand washing and proper equipment cleaning during exercise session.   Falls  Prevention: - Provides verbal and written material to individual with discussion of falls prevention and safety.      Most Recent Value   Date  04/19/15   Educator  LB   Instruction Review Code  2- meets goals/outcomes      Diabetes: - Individual verbal and written instruction to review signs/symptoms of diabetes, desired ranges of glucose level fasting, after meals and with exercise. Advice that pre and post exercise glucose checks will be done for 3 sessions at entry of program.   Chronic Lung Diseases: - Group verbal and written instruction to review new updates, new respiratory medications, new advancements in procedures and treatments. Provide informative websites and "800" numbers of self-education.   Lung Procedures: - Group verbal and written instruction to describe testing methods done to diagnose lung disease. Review the outcome of test results. Describe the treatment choices: Pulmonary Function Tests, ABGs and oximetry.   Energy Conservation: - Provide group verbal and written instruction for methods to conserve energy, plan and organize activities. Instruct on pacing techniques, use of adaptive equipment and posture/positioning to relieve shortness of breath.   Triggers: - Group verbal and written instruction to review types of environmental controls: home humidity, furnaces, filters, dust mite/pet prevention, HEPA vacuums. To discuss weather changes,  air quality and the benefits of nasal washing.   Exacerbations: - Group verbal and written instruction to provide: warning signs, infection symptoms, calling MD promptly, preventive modes, and value of vaccinations. Review: effective airway clearance, coughing and/or vibration techniques. Create an Sport and exercise psychologist.   Oxygen: - Individual and group verbal and written instruction on oxygen therapy. Includes supplement oxygen, available portable oxygen systems, continuous and intermittent flow rates, oxygen safety, concentrators,  and Medicare reimbursement for oxygen.      Most Recent Value   Date  04/19/15   Educator  LB   Instruction Review Code  2- meets goals/outcomes      Respiratory Medications: - Group verbal and written instruction to review medications for lung disease. Drug class, frequency, complications, importance of spacers, rinsing mouth after steroid MDI's, and proper cleaning methods for nebulizers.      Most Recent Value   Date  04/19/15   Educator  LB   Instruction Review Code  2- meets goals/outcomes      AED/CPR: - Group verbal and written instruction with the use of models to demonstrate the basic use of the AED with the basic ABC's of resuscitation.   Breathing Retraining: - Provides individuals verbal and written instruction on purpose, frequency, and proper technique of diaphragmatic breathing and pursed-lipped breathing. Applies individual practice skills.   Anatomy and Physiology of the Lungs: - Group verbal and written instruction with the use of models to provide basic lung anatomy and physiology related to function, structure and complications of lung disease.   Heart Failure: - Group verbal and written instruction on the basics of heart failure: signs/symptoms, treatments, explanation of ejection fraction, enlarged heart and cardiomyopathy.   Sleep Apnea: - Individual verbal and written instruction to review Obstructive Sleep Apnea. Review of risk factors, methods for diagnosing and types of masks and machines for OSA.   Anxiety: - Provides group, verbal and written instruction on the correlation between heart/lung disease and anxiety, treatment options, and management of anxiety.   Relaxation: - Provides group, verbal and written instruction about the benefits of relaxation for patients with heart/lung disease. Also provides patients with examples of relaxation techniques.   Knowledge Questionnaire Score:     Knowledge Questionnaire Score - 04/19/15 1130     Knowledge Questionnaire Score   Pre Score -3      Personal Goals and Risk Factors at Admission:     Personal Goals and Risk Factors at Admission - 04/19/15 1130    Personal Goals and Risk Factors on Admission    Weight Management Yes   Intervention Learn and follow the exercise and diet guidelines while in the program. Utilize the nutrition and education classes to help gain knowledge of the diet and exercise expectations in the program  Catherine Holmes would like to meet with the dietitian.  Catherine Holmes goal is to lose 15lbs. She lives with Catherine Holmes daughter and they do eat alot of fruits and vegetables; no salt.   Admit Weight 165 lb (74.844 kg)   Goal Weight 150 lb (68.04 kg)   Increase Aerobic Exercise and Physical Activity Yes   Intervention While in program, learn and follow the exercise prescription taught. Start at a low level workload and increase workload after able to maintain previous level for 30 minutes. Increase time before increasing intensity.  Catherine Holmes does have a pedal machine. She would like to strength herself where she is not so dependent on Catherine Holmes cane.   Understand more about Heart/Pulmonary Disease. Yes  Intervention While in program utilize professionals for any questions, and attend the education sessions. Great websites to use are www.americanheart.org or www.lung.org for reliable information.  Catherine Holmes has COPD and CHF and is interested in learning more to manage Catherine Holmes diseases.   Improve shortness of breath with ADL's Yes   Intervention While in program, learn and follow the exercise prescription taught. Start at a low level workload and increase workload ad advised by the exercise physiologist. Increase time before increasing intensity.  Catherine Holmes scored a 67 on the [facility] SOB Questionnaire. She would like to do more activity without shortness of breath.   Develop more efficient breathing techniques such as purse lipped breathing and diaphragmatic breathing; and practicing self-pacing  with activity Yes   Intervention While in program, learn and utilize the specific breathing techniques taught to you. Continue to practice and use the techniques as needed.   Increase knowledge of respiratory medications and ability to use respiratory devices properly.  Yes   Intervention While in program learn and demonstrate appropriate use of your oxygen therapy by increasing flow with exertion, manage oxygen tank operation, including continuous and intermittent flow.  Understanding oxygen is a drug ordered by your physician.;While in program, learn to administer MDI, nebulizer, and spacer properly.;Learn to take respiratory medicine as ordered.;While in program, learn to Clean MDI, nebulizers, and spacers properly.  Catherine Holmes has a good understanding of Catherine Holmes Annie Sable, ProAir, Spacer, and Albuterol  nebulizer.  She uses a gas portable O2 tank, 2l/m [place], and Catherine Holmes service is from [facility].   Diabetes Yes   Hypertension Yes   Lipids Yes      Personal Goals and Risk Factors Review:    Personal Goals Discharge:    Comments:

## 2015-04-19 NOTE — Patient Instructions (Signed)
p Patient Instructions  Patient Details  Name: Catherine Holmes MRN: [id number] Date of Birth: Sep 06, 1928 Referring Provider:  Dr Julien Nordmann  Below are the personal goals you chose as well as exercise and nutrition goals. Our goal is to help you keep on track towards obtaining and maintaining your goals. We will be discussing your progress on these goals with you throughout the program.  Initial Exercise Prescription:     Initial Exercise Prescription - 04/19/15 1500    Date of Initial Exercise Prescription   Date 04/19/15   Treadmill   MPH 1   Grade 0   Minutes 10   Recumbant Bike   Level 1   RPM 40   Watts 20   Minutes 10   NuStep   Level 2   Watts 40   Minutes 10   Arm Ergometer   Level 1   Watts 10   Minutes 10   REL-XR   Level 1   Watts 40   Minutes 10   Prescription Details   Frequency (times per week) 3   Duration Progress to 30 minutes of continuous aerobic without signs/symptoms of physical distress   Intensity   THRR REST +  30   Ratings of Perceived Exertion 11-15   Perceived Dyspnea 2-4   Progression Continue progressive overload as per policy without signs/symptoms or physical distress.   Resistance Training   Training Prescription Yes   Weight 1   Reps 10-12      Exercise Goals: Frequency: Be able to perform aerobic exercise three times per week working toward 3-5 days per week.  Intensity: Work with a perceived exertion of 11 (fairly light) - 15 (hard) as tolerated. Follow your new exercise prescription and watch for changes in prescription as you progress with the program. Changes will be reviewed with you when they are made.  Duration: You should be able to do 30 minutes of continuous aerobic exercise in addition to a 5 minute warm-up and a 5 minute cool-down routine.  Nutrition Goals: Your personal nutrition goals will be established when you do your nutrition analysis with the dietician.  The following are nutrition guidelines to  follow: Cholesterol < /day Sodium < /day Fiber: Women over 50 yrs - 21 grams per day  Personal Goals:     Personal Goals and Risk Factors at Admission - 04/19/15 1130    Personal Goals and Risk Factors on Admission    Weight Management Yes   Intervention Learn and follow the exercise and diet guidelines while in the program. Utilize the nutrition and education classes to help gain knowledge of the diet and exercise expectations in the program  Ms Rands would like to meet with the dietitian.  Her goal is to lose 15lbs. She lives with her daughter and they do eat alot of fruits and vegetables; no salt.   Admit Weight 165 lb (74.844 kg)   Goal Weight 150 lb (68.04 kg)   Increase Aerobic Exercise and Physical Activity Yes   Intervention While in program, learn and follow the exercise prescription taught. Start at a low level workload and increase workload after able to maintain previous level for 30 minutes. Increase time before increasing intensity.  Ms Manukyan does have a pedal machine. She would like to strength herself where she is not so dependent on her cane.   Understand more about Heart/Pulmonary Disease. Yes   Intervention While in program utilize professionals for any questions, and attend the education sessions. [employer]  websites to use are www.americanheart.org or www.lung.org for reliable information.  Ms Coye has COPD and CHF and is interested in learning more to manage her diseases.   Improve shortness of breath with ADL's Yes   Intervention While in program, learn and follow the exercise prescription taught. Start at a low level workload and increase workload ad advised by the exercise physiologist. Increase time before increasing intensity.  Ms Boen scored a 2392 on the [facility] SOB Questionnaire. She would like to do more activity without shortness of breath.   Develop more efficient breathing techniques such as purse lipped breathing and diaphragmatic breathing; and  practicing self-pacing with activity Yes   Intervention While in program, learn and utilize the specific breathing techniques taught to you. Continue to practice and use the techniques as needed.   Increase knowledge of respiratory medications and ability to use respiratory devices properly.  Yes   Intervention While in program learn and demonstrate appropriate use of your oxygen therapy by increasing flow with exertion, manage oxygen tank operation, including continuous and intermittent flow.  Understanding oxygen is a drug ordered by your physician.;While in program, learn to administer MDI, nebulizer, and spacer properly.;Learn to take respiratory medicine as ordered.;While in program, learn to Clean MDI, nebulizers, and spacers properly.  Ms Reichardt has a good understanding of her Annie SableSpiriva, Dulera, ProAir, Spacer, and Albuterol  nebulizer.  She uses a gas portable O2 tank, 2l/m [place], and her service is from [facility].   Diabetes Yes   Hypertension Yes   Lipids Yes      Tobacco Use Initial Evaluation: History  Smoking status   Former Smoker -- 0.50 packs/day for 30 years   Types: Cigarettes   Quit date: 10/22/1994  Smokeless tobacco   Never Used    Copy of goals given to participant.

## 2015-04-19 NOTE — Progress Notes (Signed)
Pulmonary Individual Treatment Plan  Patient Details  Name: Catherine Holmes MRN: [id number] Date of Birth: [birth year] Referring Provider:  Antonieta Iba, MD  Initial Encounter Date: 04/19/2015  Visit Diagnosis: COPD (chronic obstructive pulmonary disease) with chronic bronchitis  Diastolic congestive heart failure, unspecified congestive heart failure chronicity  Patient's Home Medications on Admission:  Current outpatient prescriptions:    albuterol (PROAIR HFA) 108 (90 BASE) MCG/ACT inhaler, Inhale 2 puffs into the lungs every 6 (six) hours as needed., Disp: 3 Inhaler, Rfl: 0   albuterol (PROVENTIL) (2.5 MG/3ML) 0.083% nebulizer solution, Take 2.5 mg by nebulization every 6 (six) hours as needed for wheezing or shortness of breath. , Disp: , Rfl:    arformoterol (BROVANA) 15 MCG/2ML NEBU, Take 2 mLs (15 mcg total) by nebulization 2 (two) times daily., Disp: 120 mL, Rfl: 4   aspirin EC 81 MG tablet, Take 81 mg by mouth daily., Disp: , Rfl:    BETA CAROTENE PO, Take by mouth 2 (two) times daily., Disp: , Rfl:    beta carotene w/minerals (OCUVITE) tablet, Take 1 tablet by mouth 2 (two) times daily., Disp: , Rfl:    bumetanide (BUMEX) 1 MG tablet, Take 1 tablet (1 mg total) by mouth daily. Take 1 tab daily, Disp: 180 tablet, Rfl: 3   calcium carbonate (OS-CAL) 600 MG TABS tablet, Take 600 mg by mouth daily with breakfast., Disp: , Rfl:    Cholecalciferol (VITAMIN D-3 PO), Take 1,000 Units by mouth., Disp: , Rfl:    citalopram (CELEXA) 20 MG tablet, Take 20 mg by mouth daily., Disp: , Rfl:    diphenhydramine-acetaminophen (TYLENOL PM) 25-500 MG TABS, Take 1 tablet by mouth at bedtime as needed., Disp: , Rfl:    erythromycin ophthalmic ointment, Place 1 application into both eyes at bedtime., Disp: , Rfl:    ezetimibe (ZETIA) 10 MG tablet, Take 1 tablet (10 mg total) by mouth daily., Disp: 90 tablet, Rfl: 3   fluticasone (FLONASE) 50 MCG/ACT nasal spray, Place 1 spray into  both nostrils daily., Disp: , Rfl:    gabapentin (NEURONTIN) 100 MG capsule, Take 100 mg by mouth 2 (two) times daily. , Disp: , Rfl:    HYDROcodone-acetaminophen (NORCO/VICODIN) 5-325 MG per tablet, Take 1 tablet by mouth as needed for severe pain. , Disp: , Rfl:    mometasone-formoterol (DULERA) 100-5 MCG/ACT AERO, Inhale 2 puffs into the lungs 2 (two) times daily. , Disp: , Rfl:    Multiple Vitamin (MULTIVITAMIN) tablet, Take 1 tablet by mouth daily.  , Disp: , Rfl:    nebivolol (BYSTOLIC) 5 MG tablet, Take 1 tablet (5 mg total) by mouth daily., Disp: 30 tablet, Rfl: 0   nitroGLYCERIN (NITROSTAT) 0.4 MG SL tablet, Place 1 tablet (0.4 mg total) under the tongue every 5 (five) minutes as needed for chest pain., Disp: 25 tablet, Rfl: 6   potassium chloride (K-DUR,KLOR-CON) 10 MEQ tablet, Take 5 mEq by mouth 2 (two) times daily as needed. Patient takes this when she takes Bumex., Disp: , Rfl:    predniSONE (STERAPRED UNI-PAK 21 TAB) 10 MG (21) TBPK tablet, Take 1 tablet (10 mg total) by mouth daily. 6 tabs PO x 1 day 5 tabs PO x 1 day 4 tabs PO x 1 day 3 tabs PO x 1 day 2 tabs PO x 1 day 1 tab PO x 1 day and stop, Disp: 21 tablet, Rfl: 0   tiotropium (SPIRIVA) 18 MCG inhalation capsule, Place 1 capsule (18 mcg total) into inhaler  and inhale daily., Disp: 90 capsule, Rfl: 0   triamcinolone (KENALOG) 0.025 % cream, Apply 1 application topically daily as needed (for arms)., Disp: , Rfl:    trimethoprim-polymyxin b (POLYTRIM) ophthalmic solution, Place 1 drop into both eyes 2 (two) times daily., Disp: , Rfl:   Past Medical History: Past Medical History  Diagnosis Date   Essential hypertension    Diabetes mellitus type II, controlled    COPD (chronic obstructive pulmonary disease)    Osteoporosis    Carotid arterial disease     a. 2012 Carotid dopplers: 40-59% R ICA, 60-79% L ICA   T[place]    Arthralgia    Sjoegren syndrome    TIA (transient ischemic attack)     Emphysema    MVP (mitral valve prolapse)    Chronic diastolic CHF (congestive heart failure)     a. 08/2011 Echo: EF 65-70%, mild LVH, mod dil LA, mildly dil RA, mild MR, mild-mod AoV Sclerosis w/o stenosis, mod-sev elev PA pressures, mild TR.   Acid reflux    Diverticulitis    Anemia    History of pneumonia    Syncope and collapse    Anemia    Hip fracture, left    Hyperlipidemia    Hiatal hernia    Chest pain     a. 08/2011 MV: EF 74%, no ischemia.   Noncompliance     a. h/o not taking bumex as Rx.   PAF (paroxysmal atrial fibrillation)     a. CHA2DS2VASc = 8 ->No OAC 2/2 falls.    Tobacco Use: History  Smoking status   Former Smoker -- 0.50 packs/day for 30 years   Types: Cigarettes   Quit date: 10/22/1994  Smokeless tobacco   Never Used    Labs: Recent Review Flowsheet Data    There is no flowsheet data to display.       ADL [facility]:     ADL [facility]      04/19/15 1305       ADL [facility]   ADL Phase Entry     SOB Score total 92     Rest 1     Walk 4     Stairs 5     Bath 4     Dress 5     Shop 5         Pulmonary Function Assessment:     Pulmonary Function Assessment - 04/19/15 1130    Pulmonary Function Tests   FVC% 36 %   FEV1% 35 %   FEV1/FVC Ratio 71.7   Breath   Bilateral Breath Sounds Clear;Decreased   Shortness of Breath Yes      Exercise Target Goals:    Exercise Program Goal: Individual exercise prescription set with THRR, safety & activity barriers. Participant demonstrates ability to understand and report RPE using BORG scale, to self-measure pulse accurately, and to acknowledge the importance of the exercise prescription.  Exercise Prescription Goal: Starting with aerobic activity 30 plus minutes a day, 3 days per week for initial exercise prescription. Provide home exercise prescription and guidelines that participant acknowledges understanding prior to discharge.  Activity Barriers & Risk Stratification:      Activity Barriers & Risk Stratification - 04/19/15 1130    Activity Barriers & Risk Stratification   Activity Barriers Shortness of Breath;Balance Concerns;Deconditioning;Assistive Device   Risk Stratification Moderate      6 Minute Walk:   Initial Exercise Prescription:   Exercise Prescription Changes:   Discharge Exercise Prescription (Final  Exercise Prescription Changes):    Nutrition:  Target Goals: Understanding of nutrition guidelines, daily intake of sodium 1500mg , cholesterol 200mg , calories 30% from fat and 7% or less from saturated fats, daily to have 5 or more servings of fruits and vegetables.  Biometrics:    Nutrition Therapy Plan and Nutrition Goals:     Nutrition Therapy & Goals - 04/19/15 1312    Nutrition Therapy   Diet Ms Kozikowski would like to meet with the dietitian. her goal is to lose 15lbs.      Nutrition Discharge: Rate Your Plate Scores:   Psychosocial: Target Goals: Acknowledge presence or absence of depression, maximize coping skills, provide positive support system. Participant is able to verbalize types and ability to use techniques and skills needed for reducing stress and depression.  Initial Review & Psychosocial Screening:   Quality of Life Scores:   PHQ-9:     Recent Review Flowsheet Data    There is no flowsheet data to display.      Psychosocial Evaluation and Intervention:   Psychosocial Re-Evaluation:  Education: Education Goals: Education classes will be provided on a weekly basis, covering required topics. Participant will state understanding/return demonstration of topics presented.  Learning Barriers/Preferences:     Learning Barriers/Preferences - 04/19/15 1130    Learning Barriers/Preferences   Learning Barriers None   Learning Preferences Group Instruction;Individual Instruction;Pictoral;Skilled Demonstration;Written Material;Video;Verbal Instruction      Education Topics: Initial Evaluation  Education: - Verbal, written and demonstration of respiratory meds, RPE/PD scales, oximetry and breathing techniques. Instruction on use of nebulizers and MDIs: cleaning and proper use, rinsing mouth with steroid doses and importance of monitoring MDI activations.          Most Recent Value   Date  04/19/15   Educator  LB   Instruction Review Code  2- meets goals/outcomes      General Nutrition Guidelines/Fats and Fiber: -Group instruction provided by verbal, written material, models and posters to present the general guidelines for heart healthy nutrition. Gives an explanation and review of dietary fats and fiber.   Controlling Sodium/Reading Food Labels: -Group verbal and written material supporting the discussion of sodium use in heart healthy nutrition. Review and explanation with models, verbal and written materials for utilization of the food label.   Exercise Physiology & Risk Factors: - Group verbal and written instruction with models to review the exercise physiology of the cardiovascular system and associated critical values. Details cardiovascular disease risk factors and the goals associated with each risk factor.   Aerobic Exercise & Resistance Training: - Gives group verbal and written discussion on the health impact of inactivity. On the components of aerobic and resistive training programs and the benefits of this training and how to safely progress through these programs.   Flexibility, Balance, General Exercise Guidelines: - Provides group verbal and written instruction on the benefits of flexibility and balance training programs. Provides general exercise guidelines with specific guidelines to those with heart or lung disease. Demonstration and skill practice provided.   Stress Management: - Provides group verbal and written instruction about the health risks of elevated stress, cause of high stress, and healthy ways to reduce stress.   Depression: - Provides  group verbal and written instruction on the correlation between heart/lung disease and depressed mood, treatment options, and the stigmas associated with seeking treatment.   Exercise & Equipment Safety: - Individual verbal instruction and demonstration of equipment use and safety with use of the equipment.   Infection Prevention: -  Provides verbal and written material to individual with discussion of infection control including proper hand washing and proper equipment cleaning during exercise session.   Falls Prevention: - Provides verbal and written material to individual with discussion of falls prevention and safety.      Most Recent Value   Date  04/19/15   Educator  LB   Instruction Review Code  2- meets goals/outcomes      Diabetes: - Individual verbal and written instruction to review signs/symptoms of diabetes, desired ranges of glucose level fasting, after meals and with exercise. Advice that pre and post exercise glucose checks will be done for 3 sessions at entry of program.   Chronic Lung Diseases: - Group verbal and written instruction to review new updates, new respiratory medications, new advancements in procedures and treatments. Provide informative websites and "800" numbers of self-education.   Lung Procedures: - Group verbal and written instruction to describe testing methods done to diagnose lung disease. Review the outcome of test results. Describe the treatment choices: Pulmonary Function Tests, ABGs and oximetry.   Energy Conservation: - Provide group verbal and written instruction for methods to conserve energy, plan and organize activities. Instruct on pacing techniques, use of adaptive equipment and posture/positioning to relieve shortness of breath.   Triggers: - Group verbal and written instruction to review types of environmental controls: home humidity, furnaces, filters, dust mite/pet prevention, HEPA vacuums. To discuss weather changes, air  quality and the benefits of nasal washing.   Exacerbations: - Group verbal and written instruction to provide: warning signs, infection symptoms, calling MD promptly, preventive modes, and value of vaccinations. Review: effective airway clearance, coughing and/or vibration techniques. Create an Sport and exercise psychologist.   Oxygen: - Individual and group verbal and written instruction on oxygen therapy. Includes supplement oxygen, available portable oxygen systems, continuous and intermittent flow rates, oxygen safety, concentrators, and Medicare reimbursement for oxygen.      Most Recent Value   Date  04/19/15   Educator  LB   Instruction Review Code  2- meets goals/outcomes      Respiratory Medications: - Group verbal and written instruction to review medications for lung disease. Drug class, frequency, complications, importance of spacers, rinsing mouth after steroid MDI's, and proper cleaning methods for nebulizers.      Most Recent Value   Date  04/19/15   Educator  LB   Instruction Review Code  2- meets goals/outcomes      AED/CPR: - Group verbal and written instruction with the use of models to demonstrate the basic use of the AED with the basic ABC's of resuscitation.   Breathing Retraining: - Provides individuals verbal and written instruction on purpose, frequency, and proper technique of diaphragmatic breathing and pursed-lipped breathing. Applies individual practice skills.   Anatomy and Physiology of the Lungs: - Group verbal and written instruction with the use of models to provide basic lung anatomy and physiology related to function, structure and complications of lung disease.   Heart Failure: - Group verbal and written instruction on the basics of heart failure: signs/symptoms, treatments, explanation of ejection fraction, enlarged heart and cardiomyopathy.   Sleep Apnea: - Individual verbal and written instruction to review Obstructive Sleep Apnea. Review of risk  factors, methods for diagnosing and types of masks and machines for OSA.   Anxiety: - Provides group, verbal and written instruction on the correlation between heart/lung disease and anxiety, treatment options, and management of anxiety.   Relaxation: - Provides group, verbal and written instruction about  the benefits of relaxation for patients with heart/lung disease. Also provides patients with examples of relaxation techniques.   Knowledge Questionnaire Score:     Knowledge Questionnaire Score - 04/19/15 1130    Knowledge Questionnaire Score   Pre Score -3      Personal Goals and Risk Factors at Admission:     Personal Goals and Risk Factors at Admission - 04/19/15 1130    Personal Goals and Risk Factors on Admission    Weight Management Yes   Intervention Learn and follow the exercise and diet guidelines while in the program. Utilize the nutrition and education classes to help gain knowledge of the diet and exercise expectations in the program  Ms Piscitello would like to meet with the dietitian.  Her goal is to lose 15lbs. She lives with her daughter and they do eat alot of fruits and vegetables; no salt.   Admit Weight 165 lb (74.844 kg)   Goal Weight 150 lb (68.04 kg)   Increase Aerobic Exercise and Physical Activity Yes   Intervention While in program, learn and follow the exercise prescription taught. Start at a low level workload and increase workload after able to maintain previous level for 30 minutes. Increase time before increasing intensity.  Ms Chanthavong does have a pedal machine. She would like to strength herself where she is not so dependent on her cane.   Understand more about Heart/Pulmonary Disease. Yes   Intervention While in program utilize professionals for any questions, and attend the education sessions. Great websites to use are www.americanheart.org or www.lung.org for reliable information.  Ms Abila has COPD and CHF and is interested in learning more to  manage her diseases.   Improve shortness of breath with ADL's Yes   Intervention While in program, learn and follow the exercise prescription taught. Start at a low level workload and increase workload ad advised by the exercise physiologist. Increase time before increasing intensity.  Ms Chestang scored a 29 on the [facility] SOB Questionnaire. She would like to do more activity without shortness of breath.   Develop more efficient breathing techniques such as purse lipped breathing and diaphragmatic breathing; and practicing self-pacing with activity Yes   Intervention While in program, learn and utilize the specific breathing techniques taught to you. Continue to practice and use the techniques as needed.   Increase knowledge of respiratory medications and ability to use respiratory devices properly.  Yes   Intervention While in program learn and demonstrate appropriate use of your oxygen therapy by increasing flow with exertion, manage oxygen tank operation, including continuous and intermittent flow.  Understanding oxygen is a drug ordered by your physician.;While in program, learn to administer MDI, nebulizer, and spacer properly.;Learn to take respiratory medicine as ordered.;While in program, learn to Clean MDI, nebulizers, and spacers properly.  Ms Moorehouse has a good understanding of her Annie Sable, ProAir, Spacer, and Albuterol  nebulizer.  She uses a gas portable O2 tank, 2l/m , and her service is from [facility].   Diabetes Yes   Hypertension Yes   Lipids Yes      Personal Goals and Risk Factors Review:    Personal Goals Discharge:    Comments: Ms Sharples will start [facility] 04/22/15 and will attend 3d/wk.

## 2015-04-19 NOTE — Progress Notes (Signed)
Pulmonary Individual Treatment Plan  Patient Details  Name: Catherine Holmes MRN: [id number] Date of Birth: August 13, 1928 Referring Provider:  Antonieta Iba, MD  Initial Encounter Date:    Visit Diagnosis: COPD (chronic obstructive pulmonary disease) with chronic bronchitis  Diastolic congestive heart failure, unspecified congestive heart failure chronicity  Patient's Home Medications on Admission:  Current outpatient prescriptions:  .  albuterol (PROAIR HFA) 108 (90 BASE) MCG/ACT inhaler, Inhale 2 puffs into the lungs every 6 (six) hours as needed., Disp: 3 Inhaler, Rfl: 0 .  albuterol (PROVENTIL) (2.5 MG/3ML) 0.083% nebulizer solution, Take 2.5 mg by nebulization every 6 (six) hours as needed for wheezing or shortness of breath. , Disp: , Rfl:  .  arformoterol (BROVANA) 15 MCG/2ML NEBU, Take 2 mLs (15 mcg total) by nebulization 2 (two) times daily., Disp: 120 mL, Rfl: 4 .  aspirin EC 81 MG tablet, Take 81 mg by mouth daily., Disp: , Rfl:  .  BETA CAROTENE PO, Take by mouth 2 (two) times daily., Disp: , Rfl:  .  beta carotene w/minerals (OCUVITE) tablet, Take 1 tablet by mouth 2 (two) times daily., Disp: , Rfl:  .  bumetanide (BUMEX) 1 MG tablet, Take 1 tablet (1 mg total) by mouth daily. Take 1 tab daily, Disp: 180 tablet, Rfl: 3 .  calcium carbonate (OS-CAL) 600 MG TABS tablet, Take 600 mg by mouth daily with breakfast., Disp: , Rfl:  .  Cholecalciferol (VITAMIN D-3 PO), Take 1,000 Units by mouth., Disp: , Rfl:  .  citalopram (CELEXA) 20 MG tablet, Take 20 mg by mouth daily., Disp: , Rfl:  .  diphenhydramine-acetaminophen (TYLENOL PM) 25-500 MG TABS, Take 1 tablet by mouth at bedtime as needed., Disp: , Rfl:  .  erythromycin ophthalmic ointment, Place 1 application into both eyes at bedtime., Disp: , Rfl:  .  ezetimibe (ZETIA) 10 MG tablet, Take 1 tablet (10 mg total) by mouth daily., Disp: 90 tablet, Rfl: 3 .  fluticasone (FLONASE) 50 MCG/ACT nasal spray, Place 1 spray into both  nostrils daily., Disp: , Rfl:  .  gabapentin (NEURONTIN) 100 MG capsule, Take 100 mg by mouth 2 (two) times daily. , Disp: , Rfl:  .  HYDROcodone-acetaminophen (NORCO/VICODIN) 5-325 MG per tablet, Take 1 tablet by mouth as needed for severe pain. , Disp: , Rfl:  .  mometasone-formoterol (DULERA) 100-5 MCG/ACT AERO, Inhale 2 puffs into the lungs 2 (two) times daily. , Disp: , Rfl:  .  Multiple Vitamin (MULTIVITAMIN) tablet, Take 1 tablet by mouth daily.  , Disp: , Rfl:  .  nebivolol (BYSTOLIC) 5 MG tablet, Take 1 tablet (5 mg total) by mouth daily., Disp: 30 tablet, Rfl: 0 .  nitroGLYCERIN (NITROSTAT) 0.4 MG SL tablet, Place 1 tablet (0.4 mg total) under the tongue every 5 (five) minutes as needed for chest pain., Disp: 25 tablet, Rfl: 6 .  potassium chloride (K-DUR,KLOR-CON) 10 MEQ tablet, Take 5 mEq by mouth 2 (two) times daily as needed. Patient takes this when she takes Bumex., Disp: , Rfl:  .  predniSONE (STERAPRED UNI-PAK 21 TAB) 10 MG (21) TBPK tablet, Take 1 tablet (10 mg total) by mouth daily. 6 tabs PO x 1 day 5 tabs PO x 1 day 4 tabs PO x 1 day 3 tabs PO x 1 day 2 tabs PO x 1 day 1 tab PO x 1 day and stop, Disp: 21 tablet, Rfl: 0 .  tiotropium (SPIRIVA) 18 MCG inhalation capsule, Place 1 capsule (18 mcg total) into  inhaler and inhale daily., Disp: 90 capsule, Rfl: 0 .  triamcinolone (KENALOG) 0.025 % cream, Apply 1 application topically daily as needed (for arms)., Disp: , Rfl:  .  trimethoprim-polymyxin b (POLYTRIM) ophthalmic solution, Place 1 drop into both eyes 2 (two) times daily., Disp: , Rfl:   Past Medical History: Past Medical History  Diagnosis Date  . Essential hypertension   . Diabetes mellitus type II, controlled   . COPD (chronic obstructive pulmonary disease)   . Osteoporosis   . Carotid arterial disease     a. 2012 Carotid dopplers: 40-59% R ICA, 60-79% L ICA  . Tic [place]   . Arthralgia   . Sjoegren syndrome   . TIA (transient ischemic attack)   . Emphysema    . MVP (mitral valve prolapse)   . Chronic diastolic CHF (congestive heart failure)     a. 08/2011 Echo: EF 65-70%, mild LVH, mod dil LA, mildly dil RA, mild MR, mild-mod AoV Sclerosis w/o stenosis, mod-sev elev PA pressures, mild TR.  Marland Kitchen. Acid reflux   . Diverticulitis   . Anemia   . History of pneumonia   . Syncope and collapse   . Anemia   . Hip fracture, left   . Hyperlipidemia   . Hiatal hernia   . Chest pain     a. 08/2011 MV: EF 74%, no ischemia.  . Noncompliance     a. h/o not taking bumex as Rx.  Marland Kitchen. PAF (paroxysmal atrial fibrillation)     a. CHA2DS2VASc = 8 ->No OAC 2/2 falls.    Tobacco Use: History  Smoking status  . Former Smoker -- 0.50 packs/day for 30 years  . Types: Cigarettes  . Quit date: 10/22/1994  Smokeless tobacco  . Never Used    Labs: Recent Review Flowsheet Data    There is no flowsheet data to display.       ADL UCSD:     ADL UCSD      04/19/15 1305       ADL [facility]   ADL Phase Entry     SOB Score total 92     Rest 1     Walk 4     Stairs 5     Bath 4     Dress 5     Shop 5         Pulmonary Function Assessment:     Pulmonary Function Assessment - 04/19/15 1130    Pulmonary Function Tests   FVC% 36 %   FEV1% 35 %   FEV1/FVC Ratio 71.7   Breath   Bilateral Breath Sounds Clear;Decreased   Shortness of Breath Yes      Exercise Target Goals:    Exercise Program Goal: Individual exercise prescription set with THRR, safety & activity barriers. Participant demonstrates ability to understand and report RPE using BORG scale, to self-measure pulse accurately, and to acknowledge the importance of the exercise prescription.  Exercise Prescription Goal: Starting with aerobic activity 30 plus minutes a day, 3 days per week for initial exercise prescription. Provide home exercise prescription and guidelines that participant acknowledges understanding prior to discharge.  Activity Barriers & Risk Stratification:     Activity  Barriers & Risk Stratification - 04/19/15 1130    Activity Barriers & Risk Stratification   Activity Barriers Shortness of Breath;Balance Concerns;Deconditioning;Assistive Device   Risk Stratification Moderate      6 Minute Walk:     6 Minute Walk      04/19/15 1420  6 Minute Walk   Phase Initial     Distance 180 feet     Walk Time 3 minutes     Resting HR 67 bpm     Resting BP 122/70 mmHg     Max Ex. HR 82 bpm     Max Ex. BP 132/74 mmHg     RPE 15     Perceived Dyspnea  4     Symptoms No        Initial Exercise Prescription:   Exercise Prescription Changes:   Discharge Exercise Prescription (Final Exercise Prescription Changes):    Nutrition:  Target Goals: Understanding of nutrition guidelines, daily intake of sodium 1500mg , cholesterol 200mg , calories 30% from fat and 7% or less from saturated fats, daily to have 5 or more servings of fruits and vegetables.  Biometrics:     Pre Biometrics - 04/19/15 1424    Pre Biometrics   Height 5' (1.524 m)   Weight 165 lb (74.844 kg)   Waist Circumference 41 inches   Hip Circumference 45 inches   Waist to Hip Ratio 0.91 %   BMI (Calculated) 32.3       Nutrition Therapy Plan and Nutrition Goals:     Nutrition Therapy & Goals - 04/19/15 1312    Nutrition Therapy   Diet Ms Taillon would like to meet with the dietitian. her goal is to lose 15lbs.      Nutrition Discharge: Rate Your Plate Scores:   Psychosocial: Target Goals: Acknowledge presence or absence of depression, maximize coping skills, provide positive support system. Participant is able to verbalize types and ability to use techniques and skills needed for reducing stress and depression.  Initial Review & Psychosocial Screening:   Quality of Life Scores:   PHQ-9:     Recent Review Flowsheet Data    There is no flowsheet data to display.      Psychosocial Evaluation and Intervention:   Psychosocial  Re-Evaluation:  Education: Education Goals: Education classes will be provided on a weekly basis, covering required topics. Participant will state understanding/return demonstration of topics presented.  Learning Barriers/Preferences:     Learning Barriers/Preferences - 04/19/15 1130    Learning Barriers/Preferences   Learning Barriers None   Learning Preferences Group Instruction;Individual Instruction;Pictoral;Skilled Demonstration;Written Material;Video;Verbal Instruction      Education Topics: Initial Evaluation Education: - Verbal, written and demonstration of respiratory meds, RPE/PD scales, oximetry and breathing techniques. Instruction on use of nebulizers and MDIs: cleaning and proper use, rinsing mouth with steroid doses and importance of monitoring MDI activations.          Most Recent Value   Date  04/19/15   Educator  LB   Instruction Review Code  2- meets goals/outcomes      General Nutrition Guidelines/Fats and Fiber: -Group instruction provided by verbal, written material, models and posters to present the general guidelines for heart healthy nutrition. Gives an explanation and review of dietary fats and fiber.   Controlling Sodium/Reading Food Labels: -Group verbal and written material supporting the discussion of sodium use in heart healthy nutrition. Review and explanation with models, verbal and written materials for utilization of the food label.   Exercise Physiology & Risk Factors: - Group verbal and written instruction with models to review the exercise physiology of the cardiovascular system and associated critical values. Details cardiovascular disease risk factors and the goals associated with each risk factor.   Aerobic Exercise & Resistance Training: - Gives group verbal and written discussion on the  health impact of inactivity. On the components of aerobic and resistive training programs and the benefits of this training and how to safely progress  through these programs.   Flexibility, Balance, General Exercise Guidelines: - Provides group verbal and written instruction on the benefits of flexibility and balance training programs. Provides general exercise guidelines with specific guidelines to those with heart or lung disease. Demonstration and skill practice provided.   Stress Management: - Provides group verbal and written instruction about the health risks of elevated stress, cause of high stress, and healthy ways to reduce stress.   Depression: - Provides group verbal and written instruction on the correlation between heart/lung disease and depressed mood, treatment options, and the stigmas associated with seeking treatment.   Exercise & Equipment Safety: - Individual verbal instruction and demonstration of equipment use and safety with use of the equipment.   Infection Prevention: - Provides verbal and written material to individual with discussion of infection control including proper hand washing and proper equipment cleaning during exercise session.   Falls Prevention: - Provides verbal and written material to individual with discussion of falls prevention and safety.      Most Recent Value   Date  04/19/15   Educator  LB   Instruction Review Code  2- meets goals/outcomes      Diabetes: - Individual verbal and written instruction to review signs/symptoms of diabetes, desired ranges of glucose level fasting, after meals and with exercise. Advice that pre and post exercise glucose checks will be done for 3 sessions at entry of program.   Chronic Lung Diseases: - Group verbal and written instruction to review new updates, new respiratory medications, new advancements in procedures and treatments. Provide informative websites and "800" numbers of self-education.   Lung Procedures: - Group verbal and written instruction to describe testing methods done to diagnose lung disease. Review the outcome of test results.  Describe the treatment choices: Pulmonary Function Tests, ABGs and oximetry.   Energy Conservation: - Provide group verbal and written instruction for methods to conserve energy, plan and organize activities. Instruct on pacing techniques, use of adaptive equipment and posture/positioning to relieve shortness of breath.   Triggers: - Group verbal and written instruction to review types of environmental controls: home humidity, furnaces, filters, dust mite/pet prevention, HEPA vacuums. To discuss weather changes, air quality and the benefits of nasal washing.   Exacerbations: - Group verbal and written instruction to provide: warning signs, infection symptoms, calling MD promptly, preventive modes, and value of vaccinations. Review: effective airway clearance, coughing and/or vibration techniques. Create an Sport and exercise psychologist.   Oxygen: - Individual and group verbal and written instruction on oxygen therapy. Includes supplement oxygen, available portable oxygen systems, continuous and intermittent flow rates, oxygen safety, concentrators, and Medicare reimbursement for oxygen.      Most Recent Value   Date  04/19/15   Educator  LB   Instruction Review Code  2- meets goals/outcomes      Respiratory Medications: - Group verbal and written instruction to review medications for lung disease. Drug class, frequency, complications, importance of spacers, rinsing mouth after steroid MDI's, and proper cleaning methods for nebulizers.      Most Recent Value   Date  04/19/15   Educator  LB   Instruction Review Code  2- meets goals/outcomes      AED/CPR: - Group verbal and written instruction with the use of models to demonstrate the basic use of the AED with the basic ABC's of resuscitation.  Breathing Retraining: - Provides individuals verbal and written instruction on purpose, frequency, and proper technique of diaphragmatic breathing and pursed-lipped breathing. Applies individual practice  skills.   Anatomy and Physiology of the Lungs: - Group verbal and written instruction with the use of models to provide basic lung anatomy and physiology related to function, structure and complications of lung disease.   Heart Failure: - Group verbal and written instruction on the basics of heart failure: signs/symptoms, treatments, explanation of ejection fraction, enlarged heart and cardiomyopathy.   Sleep Apnea: - Individual verbal and written instruction to review Obstructive Sleep Apnea. Review of risk factors, methods for diagnosing and types of masks and machines for OSA.   Anxiety: - Provides group, verbal and written instruction on the correlation between heart/lung disease and anxiety, treatment options, and management of anxiety.   Relaxation: - Provides group, verbal and written instruction about the benefits of relaxation for patients with heart/lung disease. Also provides patients with examples of relaxation techniques.   Knowledge Questionnaire Score:     Knowledge Questionnaire Score - 04/19/15 1130    Knowledge Questionnaire Score   Pre Score -3      Personal Goals and Risk Factors at Admission:     Personal Goals and Risk Factors at Admission - 04/19/15 1130    Personal Goals and Risk Factors on Admission    Weight Management Yes   Intervention Learn and follow the exercise and diet guidelines while in the program. Utilize the nutrition and education classes to help gain knowledge of the diet and exercise expectations in the program  Ms Mayers would like to meet with the dietitian.  Her goal is to lose 15lbs. She lives with her daughter and they do eat alot of fruits and vegetables; no salt.   Admit Weight 165 lb (74.844 kg)   Goal Weight 150 lb (68.04 kg)   Increase Aerobic Exercise and Physical Activity Yes   Intervention While in program, learn and follow the exercise prescription taught. Start at a low level workload and increase workload after able  to maintain previous level for 30 minutes. Increase time before increasing intensity.  Ms Pavel does have a pedal machine. She would like to strength herself where she is not so dependent on her cane.   Understand more about Heart/Pulmonary Disease. Yes   Intervention While in program utilize professionals for any questions, and attend the education sessions. Great websites to use are www.americanheart.org or www.lung.org for reliable information.  Ms Bueche has COPD and CHF and is interested in learning more to manage her diseases.   Improve shortness of breath with ADL's Yes   Intervention While in program, learn and follow the exercise prescription taught. Start at a low level workload and increase workload ad advised by the exercise physiologist. Increase time before increasing intensity.  Ms Kopka scored a 67 on the [facility] SOB Questionnaire. She would like to do more activity without shortness of breath.   Develop more efficient breathing techniques such as purse lipped breathing and diaphragmatic breathing; and practicing self-pacing with activity Yes   Intervention While in program, learn and utilize the specific breathing techniques taught to you. Continue to practice and use the techniques as needed.   Increase knowledge of respiratory medications and ability to use respiratory devices properly.  Yes   Intervention While in program learn and demonstrate appropriate use of your oxygen therapy by increasing flow with exertion, manage oxygen tank operation, including continuous and intermittent flow.  Understanding oxygen is  a drug ordered by your physician.;While in program, learn to administer MDI, nebulizer, and spacer properly.;Learn to take respiratory medicine as ordered.;While in program, learn to Clean MDI, nebulizers, and spacers properly.  Ms Pellman has a good understanding of her Annie Sable, ProAir, Spacer, and Albuterol  nebulizer.  She uses a gas portable O2 tank, 2l/m [place], and her  service is from [facility].   Diabetes Yes   Hypertension Yes   Lipids Yes      Personal Goals and Risk Factors Review:    Personal Goals Discharge:    Comments:

## 2015-04-19 NOTE — Patient Instructions (Signed)
p Patient Instructions  Patient Details  Name: Catherine AmassHelen K Holmes MRN: [id number] Date of Birth: February 04, 1928 Referring Provider:  Dr Julien Nordmannimothy Gollan  Below are the personal goals you chose as well as exercise and nutrition goals. Our goal is to help you keep on track towards obtaining and maintaining your goals. We will be discussing your progress on these goals with you throughout the program.  Initial Exercise Prescription:   Exercise Goals: Frequency: Be able to perform aerobic exercise three times per week working toward 3-5 days per week.  Intensity: Work with a perceived exertion of 11 (fairly light) - 15 (hard) as tolerated. Follow your new exercise prescription and watch for changes in prescription as you progress with the program. Changes will be reviewed with you when they are made.  Duration: You should be able to do 30 minutes of continuous aerobic exercise in addition to a 5 minute warm-up and a 5 minute cool-down routine.  Nutrition Goals: Your personal nutrition goals will be established when you do your nutrition analysis with the dietician.  The following are nutrition guidelines to follow: Cholesterol < 200mg /day Sodium < 1500mg /day Fiber: Women over 50 yrs - 21 grams per day  Personal Goals:     Personal Goals and Risk Factors at Admission - 04/19/15 1130    Personal Goals and Risk Factors on Admission    Weight Management Yes   Intervention Learn and follow the exercise and diet guidelines while in the program. Utilize the nutrition and education classes to help gain knowledge of the diet and exercise expectations in the program  [name] would like to meet with the dietitian.  Her goal is to lose 15lbs. She lives with her daughter and they do eat alot of fruits and vegetables; no salt.   Admit Weight 165 lb (74.844 kg)   Goal Weight 150 lb (68.04 kg)   Increase Aerobic Exercise and Physical Activity Yes   Intervention While in program, learn and follow the  exercise prescription taught. Start at a low level workload and increase workload after able to maintain previous level for 30 minutes. Increase time before increasing intensity.  [name] does have a pedal machine. She would like to strength herself where she is not so dependent on her cane.   Understand more about Heart/Pulmonary Disease. Yes   Intervention While in program utilize professionals for any questions, and attend the education sessions. Great websites to use are www.americanheart.org or www.lung.org for reliable information.  [name] has COPD and CHF and is interested in learning more to manage her diseases.   Improve shortness of breath with ADL's Yes   Intervention While in program, learn and follow the exercise prescription taught. Start at a low level workload and increase workload ad advised by the exercise physiologist. Increase time before increasing intensity.  [name] scored a 3492 on the [facility] SOB Questionnaire. She would like to do more activity without shortness of breath.   Develop more efficient breathing techniques such as purse lipped breathing and diaphragmatic breathing; and practicing self-pacing with activity Yes   Intervention While in program, learn and utilize the specific breathing techniques taught to you. Continue to practice and use the techniques as needed.   Increase knowledge of respiratory medications and ability to use respiratory devices properly.  Yes   Intervention While in program learn and demonstrate appropriate use of your oxygen therapy by increasing flow with exertion, manage oxygen tank operation, including continuous and intermittent flow.  Understanding oxygen  is a drug ordered by your physician.;While in program, learn to administer MDI, nebulizer, and spacer properly.;Learn to take respiratory medicine as ordered.;While in program, learn to Clean MDI, nebulizers, and spacers properly.  [name] has a good understanding of her Annie Sable,  ProAir, Spacer, and Albuterol  nebulizer.  She uses a gas portable O2 tank, 2l/m [place], and her service is from [facility].   Diabetes Yes   Hypertension Yes   Lipids Yes      Tobacco Use Initial Evaluation: History  Smoking status   Former Smoker -- 0.50 packs/day for 30 years   Types: Cigarettes   Quit date: 10/22/1994  Smokeless tobacco   Never Used    Copy of goals given to participant.

## 2015-04-19 NOTE — Addendum Note (Signed)
Addended by: Glennon MacWAY, Manisha Cancel M on: 04/19/2015 03:34 PM   Modules accepted: Orders

## 2015-04-20 ENCOUNTER — Encounter: Payer: Self-pay | Admitting: Internal Medicine

## 2015-04-22 ENCOUNTER — Other Ambulatory Visit: Payer: Self-pay | Admitting: Orthopedic Surgery

## 2015-04-22 DIAGNOSIS — M5416 Radiculopathy, lumbar region: Secondary | ICD-10-CM

## 2015-04-22 NOTE — Telephone Encounter (Signed)
Daughter calling back .  Please call to discuss bp meds and visit with Dr. Burnadette PopLinthavong

## 2015-04-27 ENCOUNTER — Encounter: Payer: Medicare Other | Attending: Cardiovascular Disease | Admitting: *Deleted

## 2015-04-27 DIAGNOSIS — I5030 Unspecified diastolic (congestive) heart failure: Secondary | ICD-10-CM | POA: Diagnosis present

## 2015-04-27 DIAGNOSIS — J449 Chronic obstructive pulmonary disease, unspecified: Secondary | ICD-10-CM | POA: Insufficient documentation

## 2015-04-27 LAB — GLUCOSE, CAPILLARY
GLUCOSE-CAPILLARY: 115 mg/dL — AB (ref 65–99)
GLUCOSE-CAPILLARY: 122 mg/dL — AB (ref 65–99)

## 2015-04-27 NOTE — Progress Notes (Signed)
Daily Session Note  Patient Details  Name: Catherine Holmes MRN: [id number] Date of Birth: 1928/03/13 Referring Provider:  Juanito Doom, MD  Encounter Date: 04/27/2015  Check In:     Session Check In - 04/27/15 1159    Check-In   Staff Present Laureen Owens Shark BS, RRT, Respiratory Therapist;Carroll Enterkin RN, BSN;Renee Dillard Essex MS, ACSM CEP Exercise Physiologist;Steven Way BS, ACSM EP-C, Exercise Physiologist   ER physicians immediately available to respond to emergencies [facility] immediately available ER MD   Physician(s) Joni Fears and Thomasene Lot   Medication changes reported     No   Fall or balance concerns reported    No   Warm-up and Cool-down Performed on first and last piece of equipment   VAD Patient? No   Pain Assessment   Currently in Pain? No/denies   Multiple Pain Sites No         POCT Glucose - 04/27/15 1130    POCT Blood Glucose   Pre-Exercise 115 mg/dL   Post-Exercise 122 mg/dL          Exercise Prescription Changes - 04/27/15 1500    Response to Exercise   Blood Pressure (Admit) 118/70 mmHg   Blood Pressure (Exercise) 130/70 mmHg   Blood Pressure (Exit) 120/62 mmHg   Heart Rate (Admit) 68 bpm   Heart Rate (Exercise) 105 bpm   Heart Rate (Exit) 90 bpm   Oxygen Saturation (Admit) 92 %   Oxygen Saturation (Exercise) 90 %   Oxygen Saturation (Exit) 95 %   Rating of Perceived Exertion (Exercise) 13   Perceived Dyspnea (Exercise) 3   Resistance Training   Training Prescription Yes   Weight 1   Treadmill   MPH 0.8   Grade 0   Minutes 8   Recumbant Bike   Level 2   RPM 24   Minutes 10   NuStep   Level 2   Watts 20   Minutes 10      Goals Met:  Proper associated with RPD/PD & O2 Sat Exercise tolerated well Personal goals reviewed Queuing for purse lip breathing Strength training completed today  Goals Unmet:  Not Applicable  Goals Comments: First day in [facility] exercising; tolerated well.   Dr. Emily Filbert is Medical Director  for [facility] and [facility].

## 2015-04-27 NOTE — Progress Notes (Signed)
Daily Session Note  Patient Details  Name: Catherine Holmes MRN: [id number] Date of Birth: 14-Aug-1928 Referring Provider:  Dion Body, MD  Encounter Date: 04/27/2015  Check In:     Session Check In - 04/27/15 1159    Check-In   Staff Present Laureen Owens Shark BS, RRT, Respiratory Therapist;Carroll Enterkin RN, BSN;Taresa [place]ville Dillard Essex MS, ACSM CEP Exercise Physiologist;Steven Way BS, ACSM EP-C, Exercise Physiologist   ER physicians immediately available to respond to emergencies [facility] immediately available ER MD   Physician(s) Joni Fears and Thomasene Lot   Medication changes reported     No   Fall or balance concerns reported    No   Warm-up and Cool-down Performed on first and last piece of equipment   VAD Patient? No   Pain Assessment   Currently in Pain? No/denies   Multiple Pain Sites No         Goals Met:  Proper associated with RPD/PD & O2 Sat Exercise tolerated well Personal goals reviewed Queuing for purse lip breathing Strength training completed today  Goals Unmet:  Not Applicable  Goals Comments: First day in exercise class.    Dr. Emily Filbert is Medical Director for [place] and [facility].

## 2015-04-29 ENCOUNTER — Encounter: Payer: Medicare Other | Admitting: *Deleted

## 2015-04-29 DIAGNOSIS — J449 Chronic obstructive pulmonary disease, unspecified: Secondary | ICD-10-CM

## 2015-04-29 DIAGNOSIS — I5030 Unspecified diastolic (congestive) heart failure: Secondary | ICD-10-CM

## 2015-04-29 LAB — GLUCOSE, CAPILLARY
Glucose-Capillary: 99 mg/dL (ref 65–99)
Glucose-Capillary: 109 mg/dL — ABNORMAL HIGH (ref 65–99)

## 2015-04-29 NOTE — Progress Notes (Signed)
Daily Session Note  Patient Details  Name: Catherine Holmes MRN: [id number] Date of Birth: [birth year] Referring Provider:  Dion Body, MD  Encounter Date: 04/29/2015  Check In:     Session Check In - 04/29/15 1233    Check-In   Staff Present Candiss Norse MS, ACSM CEP Exercise Physiologist;Stacey Blanch Media RRT, RCP Respiratory Therapist   ER physicians immediately available to respond to emergencies [facility] immediately available ER MD   Physician(s) Kerman Passey and Lord   Medication changes reported     No   Fall or balance concerns reported    No   Warm-up and Cool-down Performed on first and last piece of equipment   VAD Patient? No   Pain Assessment   Currently in Pain? No/denies   Multiple Pain Sites No         Goals Met:  Proper associated with RPD/PD & O2 Sat Independence with exercise equipment Using PLB without cueing & demonstrates good technique Exercise tolerated well Personal goals reviewed Strength training completed today  Goals Unmet:  Not Applicable  Goals Comments:   Dr. Emily Filbert is Medical Director for [place] and [facility].

## 2015-05-02 ENCOUNTER — Other Ambulatory Visit: Payer: Self-pay | Admitting: Family Medicine

## 2015-05-02 NOTE — Progress Notes (Signed)
Notes entered on wrong date, 05/02/15. [facility]

## 2015-05-02 NOTE — Progress Notes (Unsigned)
[facility]  [phone]  [phone]     Catherine Holmes DOB: May 02, 1928  MR#: [id number]  WUX#:[id number]  Patient Care Team: Marisue Ivan, MD as PCP - General (Family Medicine) Antonieta Iba, MD as Referring Physician (Cardiology)  Patient was evaluated on 04/15/2015.  CHIEF COMPLAINT:  Patient is seen today for hospital follow-up. She was recently released from the hospital and hematocrit was 27. She was a previous patient of Dr. Wendie Simmer.   INTERVAL HISTORY:  Patient is here as a hospital follow-up regarding iron deficiency anemia. Patient was previously seen in our office for IDA under the care of Dr. Wendie Simmer and Dr. Laurell Josephs. She has previously been found to be unable to tolerate oral iron. She has chronic medical history of COPD and chronic bronchitis. She reports being weak today. He was recently in the hospital and discharged following a syncopal episode. She overall reports feeling okay but thinks that she needs Feraheme, her most recent hemoglobin 8.8 hematocrit 27.  REVIEW OF SYSTEMS:   Review of Systems  Constitutional: Positive for malaise/fatigue. Negative for fever, chills, weight loss and diaphoresis.  HENT: Negative for congestion, ear discharge, ear pain, hearing loss, nosebleeds, sore throat and tinnitus.   Eyes: Negative for blurred vision, double vision, photophobia, pain, discharge and redness.  Respiratory: Positive for shortness of breath. Negative for cough, hemoptysis, sputum production, wheezing and stridor.   Cardiovascular: Negative for chest pain, palpitations, orthopnea, claudication, leg swelling and PND.  Gastrointestinal: Negative for heartburn, nausea, vomiting, abdominal pain, diarrhea, constipation, blood in stool and melena.  Genitourinary: Negative.   Musculoskeletal: Negative.   Skin: Negative.   Neurological: Positive for weakness. Negative for dizziness, tingling, focal weakness, seizures and headaches.    Endo/Heme/Allergies: Does not bruise/bleed easily.  Psychiatric/Behavioral: Negative for depression. The patient is not nervous/anxious and does not have insomnia.     As per HPI. Otherwise, a complete review of systems is negatve.  ONCOLOGY HISTORY:  No history exists.    PAST MEDICAL HISTORY: Past Medical History  Diagnosis Date  . Essential hypertension   . Diabetes mellitus type II, controlled   . COPD (chronic obstructive pulmonary disease)   . Osteoporosis   . Carotid arterial disease     a. 2012 Carotid dopplers: 40-59% R ICA, 60-79% L ICA  . Tic douloureux   . Arthralgia   . Sjoegren syndrome   . TIA (transient ischemic attack)   . Emphysema   . MVP (mitral valve prolapse)   . Chronic diastolic CHF (congestive heart failure)     a. 08/2011 Echo: EF 65-70%, mild LVH, mod dil LA, mildly dil RA, mild MR, mild-mod AoV Sclerosis w/o stenosis, mod-sev elev PA pressures, mild TR.  Marland Kitchen Acid reflux   . Diverticulitis   . Anemia   . History of pneumonia   . Syncope and collapse   . Anemia   . Hip fracture, left   . Hyperlipidemia   . Hiatal hernia   . Chest pain     a. 08/2011 MV: EF 74%, no ischemia.  . Noncompliance     a. h/o not taking bumex as Rx.  Marland Kitchen PAF (paroxysmal atrial fibrillation)     a. CHA2DS2VASc = 8 ->No OAC 2/2 falls.    PAST SURGICAL HISTORY: Past Surgical History  Procedure Laterality Date  . Cholecystectomy  1965  . Abdominal hysterectomy  1964  . Cataract extraction Bilateral   . Hip fracture surgery  2014    left hip  FAMILY HISTORY Family History  Problem Relation Age of Onset  . Heart attack Mother   . Heart attack Father   . Heart attack Sister     GYNECOLOGIC HISTORY:  No LMP recorded. Patient has had a hysterectomy.     ADVANCED DIRECTIVES:    HEALTH MAINTENANCE: History  Substance Use Topics  . Smoking status: Former Smoker -- 0.50 packs/day for 30 years    Types: Cigarettes    Quit date: 10/22/1994  . Smokeless  tobacco: Never Used  . Alcohol Use: No     Colonoscopy:  PAP:  Bone density:  Lipid panel:  Allergies  Allergen Reactions  . Atorvastatin   . Codeine   . Doxycycline   . Epinephrine Hives and Itching  . Erythromycin   . Fluticasone-Salmeterol   . Lasix [Furosemide] Itching  . Levofloxacin   . Penicillins   . Propoxyphene Hcl   . Rosuvastatin   . Simvastatin   . Singlet [Chlorphen-Pe-Acetaminophen] Other (See Comments)    Reaction: Wheezing  . Sulfamethoxazole-Trimethoprim   . Teriparatide   . Cefaclor Rash and Other (See Comments)    Reaction: Delirious    . Glipizide Hives  . Lisinopril Hives and Itching    Current Outpatient Prescriptions  Medication Sig Dispense Refill  . albuterol (PROAIR HFA) 108 (90 BASE) MCG/ACT inhaler Inhale 2 puffs into the lungs every 6 (six) hours as needed. 3 Inhaler 0  . albuterol (PROVENTIL) (2.5 MG/3ML) 0.083% nebulizer solution Take 2.5 mg by nebulization every 6 (six) hours as needed for wheezing or shortness of breath.     Marland Kitchen arformoterol (BROVANA) 15 MCG/2ML NEBU Take 2 mLs (15 mcg total) by nebulization 2 (two) times daily. 120 mL 4  . aspirin EC 81 MG tablet Take 81 mg by mouth daily.    Marland Kitchen BETA CAROTENE PO Take by mouth 2 (two) times daily.    . beta carotene w/minerals (OCUVITE) tablet Take 1 tablet by mouth 2 (two) times daily.    . bumetanide (BUMEX) 1 MG tablet Take 1 tablet (1 mg total) by mouth daily. Take 1 tab daily 180 tablet 3  . calcium carbonate (OS-CAL) 600 MG TABS tablet Take 600 mg by mouth daily with breakfast.    . Cholecalciferol (VITAMIN D-3 PO) Take 1,000 Units by mouth.    . citalopram (CELEXA) 20 MG tablet Take 20 mg by mouth daily.    . diphenhydramine-acetaminophen (TYLENOL PM) 25-500 MG TABS Take 1 tablet by mouth at bedtime as needed.    Marland Kitchen erythromycin ophthalmic ointment Place 1 application into both eyes at bedtime.    Marland Kitchen ezetimibe (ZETIA) 10 MG tablet Take 1 tablet (10 mg total) by mouth daily. 90 tablet  3  . fluticasone (FLONASE) 50 MCG/ACT nasal spray Place 1 spray into both nostrils daily.    Marland Kitchen gabapentin (NEURONTIN) 100 MG capsule Take 100 mg by mouth 2 (two) times daily.     Marland Kitchen HYDROcodone-acetaminophen (NORCO/VICODIN) 5-325 MG per tablet Take 1 tablet by mouth as needed for severe pain.     . mometasone-formoterol (DULERA) 100-5 MCG/ACT AERO Inhale 2 puffs into the lungs 2 (two) times daily.     . Multiple Vitamin (MULTIVITAMIN) tablet Take 1 tablet by mouth daily.      . nebivolol (BYSTOLIC) 5 MG tablet Take 1 tablet (5 mg total) by mouth daily. 30 tablet 0  . nitroGLYCERIN (NITROSTAT) 0.4 MG SL tablet Place 1 tablet (0.4 mg total) under the tongue every 5 (five) minutes as  needed for chest pain. 25 tablet 6  . potassium chloride (K-DUR,KLOR-CON) 10 MEQ tablet Take 5 mEq by mouth 2 (two) times daily as needed. Patient takes this when she takes Bumex.    . predniSONE (STERAPRED UNI-PAK 21 TAB) 10 MG (21) TBPK tablet Take 1 tablet (10 mg total) by mouth daily. 6 tabs PO x 1 day 5 tabs PO x 1 day 4 tabs PO x 1 day 3 tabs PO x 1 day 2 tabs PO x 1 day 1 tab PO x 1 day and stop 21 tablet 0  . tiotropium (SPIRIVA) 18 MCG inhalation capsule Place 1 capsule (18 mcg total) into inhaler and inhale daily. 90 capsule 0  . triamcinolone (KENALOG) 0.025 % cream Apply 1 application topically daily as needed (for arms).    . trimethoprim-polymyxin b (POLYTRIM) ophthalmic solution Place 1 drop into both eyes 2 (two) times daily.     No current facility-administered medications for this visit.    OBJECTIVE: There were no vitals taken for this visit.   There is no weight on file to calculate BMI.    ECOG FS:2 - Symptomatic, <50% confined to bed  General: Well-developed, well-nourished, no acute distress. Patient is in wheelchair. Lungs: Clear to auscultation bilaterally. Heart: Regular rate and rhythm. No rubs, murmurs, or gallops. Abdomen: Soft, nontender, nondistended. No organomegaly noted,  normoactive bowel sounds. Musculoskeletal: No edema, cyanosis, or clubbing. Neuro: Alert, answering all questions appropriately. Cranial nerves grossly intact. Skin: No rashes or petechiae noted. Psych: Normal affect.    LAB RESULTS:  No visits with results within 3 Day(s) from this visit. Latest known visit with results is:  Pulmonary Rehab on 04/29/2015  Component Date Value Ref Range Status  . Glucose-Capillary 04/29/2015 109* 65 - 99 mg/dL Final    STUDIES: No results found.  ASSESSMENT:  Iron deficiency anemia  PLAN:   1. IDA. Patient was previously unable to tolerate oral iron therapy. She continues to have recurrent iron deficiency anemia as well as heme positive stools. In past patient was unable to tolerate endoscopy. With the most recent hemoglobin of 8.8, hematocrit 27, we'll proceed with a one-time dose of Feraheme today. Appointment has been made for patient to follow-up in 4 weeks.   Patient expressed understanding and was in agreement with this plan. She also understands that She can call clinic at any time with any questions, concerns, or complaints.   Dr. Doylene Canninghoksi was available for consultation and review of plan of care for this patient.   Loann QuillLeslie F Keyara Ent, NP   04/15/2015 10:50 AM

## 2015-05-03 ENCOUNTER — Other Ambulatory Visit: Payer: Self-pay | Admitting: *Deleted

## 2015-05-03 ENCOUNTER — Telehealth: Payer: Self-pay | Admitting: *Deleted

## 2015-05-03 MED ORDER — EZETIMIBE 10 MG PO TABS: 10.0000 mg | ORAL_TABLET | Freq: Every day | ORAL | Status: DC

## 2015-05-03 NOTE — Telephone Encounter (Signed)
Patient calling the office for samples of medication:   1.  What medication and dosage are you requesting samples for?  Zetia  2.  Are you currently out of this medication? No  3. Are you requesting samples to get you through until a mail order prescription arrives? No

## 2015-05-03 NOTE — Telephone Encounter (Signed)
Refill sent for zetia.  

## 2015-05-04 ENCOUNTER — Encounter: Payer: Medicare Other | Admitting: Cardiovascular Disease

## 2015-05-04 ENCOUNTER — Encounter: Payer: Medicare Other | Admitting: *Deleted

## 2015-05-04 DIAGNOSIS — I5030 Unspecified diastolic (congestive) heart failure: Secondary | ICD-10-CM

## 2015-05-04 DIAGNOSIS — J449 Chronic obstructive pulmonary disease, unspecified: Secondary | ICD-10-CM | POA: Diagnosis not present

## 2015-05-04 LAB — GLUCOSE, CAPILLARY: Glucose-Capillary: 102 mg/dL — ABNORMAL HIGH (ref 65–99)

## 2015-05-04 NOTE — Progress Notes (Signed)
Daily Session Note  Patient Details  Name: Catherine Holmes MRN: [id number] Date of Birth: 1928/09/11 Referring Provider:  Dion Body, MD  Encounter Date: 05/04/2015  Check In:     Session Check In - 05/04/15 1158    Check-In   Staff Present Laureen Owens Shark BS, RRT, Respiratory Therapist;Keyontay Stolz Dillard Essex MS, ACSM CEP Exercise Physiologist;Steven Way BS, ACSM EP-C, Exercise Physiologist   ER physicians immediately available to respond to emergencies [facility] immediately available ER MD   Physician(s) Clearnce Hasten and Quale   Medication changes reported     No   Fall or balance concerns reported    No   Warm-up and Cool-down Performed on first and last piece of equipment   VAD Patient? No   Pain Assessment   Currently in Pain? No/denies   Multiple Pain Sites No         Goals Met:  Proper associated with RPD/PD & O2 Sat Independence with exercise equipment Using PLB without cueing & demonstrates good technique Exercise tolerated well Personal goals reviewed Strength training completed today  Goals Unmet:  Not Applicable  Goals Comments: Ms Godette was very tired today and modified her exercise goals and did not stay for weights.   Dr. Emily Filbert is Medical Director for [place] and [facility].

## 2015-05-06 ENCOUNTER — Encounter: Payer: Medicare Other | Admitting: *Deleted

## 2015-05-06 DIAGNOSIS — I5030 Unspecified diastolic (congestive) heart failure: Secondary | ICD-10-CM

## 2015-05-06 DIAGNOSIS — J449 Chronic obstructive pulmonary disease, unspecified: Secondary | ICD-10-CM

## 2015-05-06 NOTE — Progress Notes (Signed)
Daily Session Note  Patient Details  Name: LIBRA GATZ MRN: [id number] Date of Birth: 19-Feb-1928 Referring Provider:  Juanito Doom, MD  Encounter Date: 05/06/2015  Check In:     Session Check In - 05/06/15 1238    Check-In   Staff Present Frederich Cha RRT, RCP Respiratory Therapist;Hien Perreira Dillard Essex MS, ACSM CEP Exercise Physiologist;Carroll Enterkin RN, BSN   ER physicians immediately available to respond to emergencies [facility] immediately available ER MD   Physician(s) Edd Fabian and Joni Fears   Medication changes reported     No   Fall or balance concerns reported    No   Warm-up and Cool-down Performed on first and last piece of equipment   VAD Patient? No   Pain Assessment   Currently in Pain? No/denies   Multiple Pain Sites No         Goals Met:  Proper associated with RPD/PD & O2 Sat Independence with exercise equipment Using PLB without cueing & demonstrates good technique Exercise tolerated well Strength training completed today  Goals Unmet:  Not Applicable  Goals Comments:    Dr. Emily Filbert is Medical Director for [place] and [facility].

## 2015-05-09 ENCOUNTER — Other Ambulatory Visit: Payer: Self-pay | Admitting: Family Medicine

## 2015-05-09 DIAGNOSIS — J449 Chronic obstructive pulmonary disease, unspecified: Secondary | ICD-10-CM

## 2015-05-09 DIAGNOSIS — I5030 Unspecified diastolic (congestive) heart failure: Secondary | ICD-10-CM

## 2015-05-09 NOTE — Progress Notes (Signed)
Pulmonary Individual Treatment Plan  Patient Details  Name: Catherine Holmes MRN: [id number] Date of Birth: 1928/05/23 Referring Provider:  Marisue Ivan, MD Dr. Kendrick Fries  Initial Encounter Date:  04/19/2015  Visit Diagnosis: COPD (chronic obstructive pulmonary disease) with chronic bronchitis  Diastolic congestive heart failure, unspecified congestive heart failure chronicity  Patient's Home Medications on Admission:  Current outpatient prescriptions:  .  albuterol (PROAIR HFA) 108 (90 BASE) MCG/ACT inhaler, Inhale 2 puffs into the lungs every 6 (six) hours as needed., Disp: 3 Inhaler, Rfl: 0 .  albuterol (PROVENTIL) (2.5 MG/3ML) 0.083% nebulizer solution, Take 2.5 mg by nebulization every 6 (six) hours as needed for wheezing or shortness of breath. , Disp: , Rfl:  .  arformoterol (BROVANA) 15 MCG/2ML NEBU, Take 2 mLs (15 mcg total) by nebulization 2 (two) times daily., Disp: 120 mL, Rfl: 4 .  aspirin EC 81 MG tablet, Take 81 mg by mouth daily., Disp: , Rfl:  .  BETA CAROTENE PO, Take by mouth 2 (two) times daily., Disp: , Rfl:  .  beta carotene w/minerals (OCUVITE) tablet, Take 1 tablet by mouth 2 (two) times daily., Disp: , Rfl:  .  bumetanide (BUMEX) 1 MG tablet, Take 1 tablet (1 mg total) by mouth daily. Take 1 tab daily, Disp: 180 tablet, Rfl: 3 .  calcium carbonate (OS-CAL) 600 MG TABS tablet, Take 600 mg by mouth daily with breakfast., Disp: , Rfl:  .  Cholecalciferol (VITAMIN D-3 PO), Take 1,000 Units by mouth., Disp: , Rfl:  .  citalopram (CELEXA) 20 MG tablet, Take 20 mg by mouth daily., Disp: , Rfl:  .  diphenhydramine-acetaminophen (TYLENOL PM) 25-500 MG TABS, Take 1 tablet by mouth at bedtime as needed., Disp: , Rfl:  .  erythromycin ophthalmic ointment, Place 1 application into both eyes at bedtime., Disp: , Rfl:  .  ezetimibe (ZETIA) 10 MG tablet, Take 1 tablet (10 mg total) by mouth daily., Disp: 90 tablet, Rfl: 3 .  fluticasone (FLONASE) 50 MCG/ACT nasal spray, Place 1  spray into both nostrils daily., Disp: , Rfl:  .  gabapentin (NEURONTIN) 100 MG capsule, Take 100 mg by mouth 2 (two) times daily. , Disp: , Rfl:  .  HYDROcodone-acetaminophen (NORCO/VICODIN) 5-325 MG per tablet, Take 1 tablet by mouth as needed for severe pain. , Disp: , Rfl:  .  mometasone-formoterol (DULERA) 100-5 MCG/ACT AERO, Inhale 2 puffs into the lungs 2 (two) times daily. , Disp: , Rfl:  .  Multiple Vitamin (MULTIVITAMIN) tablet, Take 1 tablet by mouth daily.  , Disp: , Rfl:  .  nebivolol (BYSTOLIC) 5 MG tablet, Take 1 tablet (5 mg total) by mouth daily., Disp: 30 tablet, Rfl: 0 .  nitroGLYCERIN (NITROSTAT) 0.4 MG SL tablet, Place 1 tablet (0.4 mg total) under the tongue every 5 (five) minutes as needed for chest pain., Disp: 25 tablet, Rfl: 6 .  potassium chloride (K-DUR,KLOR-CON) 10 MEQ tablet, Take 5 mEq by mouth 2 (two) times daily as needed. Patient takes this when she takes Bumex., Disp: , Rfl:  .  predniSONE (STERAPRED UNI-PAK 21 TAB) 10 MG (21) TBPK tablet, Take 1 tablet (10 mg total) by mouth daily. 6 tabs PO x 1 day 5 tabs PO x 1 day 4 tabs PO x 1 day 3 tabs PO x 1 day 2 tabs PO x 1 day 1 tab PO x 1 day and stop, Disp: 21 tablet, Rfl: 0 .  tiotropium (SPIRIVA) 18 MCG inhalation capsule, Place 1 capsule (18 mcg total)  into inhaler and inhale daily., Disp: 90 capsule, Rfl: 0 .  triamcinolone (KENALOG) 0.025 % cream, Apply 1 application topically daily as needed (for arms)., Disp: , Rfl:  .  trimethoprim-polymyxin b (POLYTRIM) ophthalmic solution, Place 1 drop into both eyes 2 (two) times daily., Disp: , Rfl:   Past Medical History: Past Medical History  Diagnosis Date  . Essential hypertension   . Diabetes mellitus type II, controlled   . COPD (chronic obstructive pulmonary disease)   . Osteoporosis   . Carotid arterial disease     a. 2012 Carotid dopplers: 40-59% R ICA, 60-79% L ICA  . Tic [place]   . Arthralgia   . Sjoegren syndrome   . TIA (transient ischemic attack)    . Emphysema   . MVP (mitral valve prolapse)   . Chronic diastolic CHF (congestive heart failure)     a. 08/2011 Echo: EF 65-70%, mild LVH, mod dil LA, mildly dil RA, mild MR, mild-mod AoV Sclerosis w/o stenosis, mod-sev elev PA pressures, mild TR.  Marland Kitchen Acid reflux   . Diverticulitis   . Anemia   . History of pneumonia   . Syncope and collapse   . Anemia   . Hip fracture, left   . Hyperlipidemia   . Hiatal hernia   . Chest pain     a. 08/2011 MV: EF 74%, no ischemia.  . Noncompliance     a. h/o not taking bumex as Rx.  Marland Kitchen PAF (paroxysmal atrial fibrillation)     a. CHA2DS2VASc = 8 ->No OAC 2/2 falls.    Tobacco Use: History  Smoking status  . Former Smoker -- 0.50 packs/day for 30 years  . Types: Cigarettes  . Quit date: 10/22/1994  Smokeless tobacco  . Never Used    Labs: Recent Review Flowsheet Data    There is no flowsheet data to display.         POCT Glucose      04/27/15 1130 05/04/15 1125         POCT Blood Glucose   Pre-Exercise 115 mg/dL       Post-Exercise 409 mg/dL       Pre-Exercise #2  99 mg/dL      Post-Exercise #2  109 mg/dL      Pre-Exercise #3  811 mg/dL      Post-Exercise #3  102 mg/dL         ADL UCSD:     ADL UCSD      04/19/15 1305       ADL UCSD   ADL Phase Entry     SOB Score total 92     Rest 1     Walk 4     Stairs 5     Bath 4     Dress 5     Shop 5         Pulmonary Function Assessment:     Pulmonary Function Assessment - 04/19/15 1130    Pulmonary Function Tests   FVC% 36 %   FEV1% 35 %   FEV1/FVC Ratio 71.7   Breath   Bilateral Breath Sounds Clear;Decreased   Shortness of Breath Yes      Exercise Target Goals:    Exercise Program Goal: Individual exercise prescription set with THRR, safety & activity barriers. Participant demonstrates ability to understand and report RPE using BORG scale, to self-measure pulse accurately, and to acknowledge the importance of the exercise prescription.  Exercise  Prescription Goal: Starting with aerobic activity 30  plus minutes a day, 3 days per week for initial exercise prescription. Provide home exercise prescription and guidelines that participant acknowledges understanding prior to discharge.  Activity Barriers & Risk Stratification:     Activity Barriers & Risk Stratification - 04/19/15 1130    Activity Barriers & Risk Stratification   Activity Barriers Shortness of Breath;Balance Concerns;Deconditioning;Assistive Device   Risk Stratification Moderate      6 Minute Walk:     6 Minute Walk      04/19/15 1420       6 Minute Walk   Phase Initial     Distance 180 feet     Walk Time 3 minutes     Resting HR 67 bpm     Resting BP 122/70 mmHg     Max Ex. HR 82 bpm     Max Ex. BP 132/74 mmHg     RPE 15     Perceived Dyspnea  4     Symptoms No        Initial Exercise Prescription:     Initial Exercise Prescription - 04/19/15 1500    Date of Initial Exercise Prescription   Date 04/19/15   Treadmill   MPH 1   Grade 0   Minutes 10   Recumbant Bike   Level 1   RPM 40   Watts 20   Minutes 10   NuStep   Level 2   Watts 40   Minutes 10   Arm Ergometer   Level 1   Watts 10   Minutes 10   REL-XR   Level 1   Watts 40   Minutes 10   Prescription Details   Frequency (times per week) 3   Duration Progress to 30 minutes of continuous aerobic without signs/symptoms of physical distress   Intensity   THRR REST +  30   Ratings of Perceived Exertion 11-15   Perceived Dyspnea 2-4   Progression Continue progressive overload as per policy without signs/symptoms or physical distress.   Resistance Training   Training Prescription Yes   Weight 1   Reps 10-12      Exercise Prescription Changes:     Exercise Prescription Changes      04/27/15 1500 04/29/15 1200 05/04/15 1500 05/09/15 1500     Exercise Review   Progression   Yes Yes    Response to Exercise   Blood Pressure (Admit) 118/70 mmHg 118/70 mmHg 120/78 mmHg  120/78 mmHg    Blood Pressure (Exercise) 130/70 mmHg 130/70 mmHg 130/68 mmHg 130/68 mmHg    Blood Pressure (Exit) 120/62 mmHg 120/62 mmHg 110/66 mmHg 110/66 mmHg    Heart Rate (Admit) 68 bpm 68 bpm 65 bpm 65 bpm    Heart Rate (Exercise) 105 bpm 105 bpm 78 bpm 78 bpm    Heart Rate (Exit) 90 bpm 90 bpm 63 bpm 63 bpm    Oxygen Saturation (Admit) 92 % 92 % 93 %  2l/m [place] 93 %  2l/m [place]    Oxygen Saturation (Exercise) 90 % 90 % 88 % 88 %    Oxygen Saturation (Exit) 95 % 95 % 96 % 96 %    Rating of Perceived Exertion (Exercise) 13 13 15 15     Perceived Dyspnea (Exercise) 3 3 4 4     Resistance Training   Training Prescription Yes Yes Yes Yes    Weight 1 1 1 1     Treadmill   MPH 0.8 0.8 1 1     Grade 0 0  0 0    Minutes 8 8 10 10     Recumbant Bike   Level 2 2 1 1     RPM 24 24 40 40    Minutes 10 10 10 10     NuStep   Level 2 2 2 2     Watts 20 20 40 40    Minutes 10 10 10 10     Recumbant Elliptical   Level  2  BIOSTEP      Watts  30      Minutes  10         Discharge Exercise Prescription (Final Exercise Prescription Changes):     Exercise Prescription Changes - 05/09/15 1500    Exercise Review   Progression Yes   Response to Exercise   Blood Pressure (Admit) 120/78 mmHg   Blood Pressure (Exercise) 130/68 mmHg   Blood Pressure (Exit) 110/66 mmHg   Heart Rate (Admit) 65 bpm   Heart Rate (Exercise) 78 bpm   Heart Rate (Exit) 63 bpm   Oxygen Saturation (Admit) 93 %  2l/m [place]   Oxygen Saturation (Exercise) 88 %   Oxygen Saturation (Exit) 96 %   Rating of Perceived Exertion (Exercise) 15   Perceived Dyspnea (Exercise) 4   Resistance Training   Training Prescription Yes   Weight 1   Treadmill   MPH 1   Grade 0   Minutes 10   Recumbant Bike   Level 1   RPM 40   Minutes 10   NuStep   Level 2   Watts 40   Minutes 10       Nutrition:  Target Goals: Understanding of nutrition guidelines, daily intake of sodium 1500mg , cholesterol 200mg , calories 30% from fat and 7%  or less from saturated fats, daily to have 5 or more servings of fruits and vegetables.  Biometrics:     Pre Biometrics - 04/19/15 1424    Pre Biometrics   Height 5' (1.524 m)   Weight 165 lb (74.844 kg)   Waist Circumference 41 inches   Hip Circumference 45 inches   Waist to Hip Ratio 0.91 %   BMI (Calculated) 32.3       Nutrition Therapy Plan and Nutrition Goals:     Nutrition Therapy & Goals - 04/19/15 1312    Nutrition Therapy   Diet Ms Bangert would like to meet with the dietitian. her goal is to lose 15lbs.      Nutrition Discharge: Rate Your Plate Scores:   Psychosocial: Target Goals: Acknowledge presence or absence of depression, maximize coping skills, provide positive support system. Participant is able to verbalize types and ability to use techniques and skills needed for reducing stress and depression.  Initial Review & Psychosocial Screening:     Initial Psych Review & Screening - 04/19/15 1130    Barriers   Psychosocial barriers to participate in program There are no identifiable barriers or psychosocial needs.  Ms Nelis has good family support from her daughter who lives with Ms Cockerham. Ms Artist just lost her son in December 2015 and indicated depression over this loss.   Screening Interventions   Interventions Program counselor consult;Encouraged to exercise      Quality of Life Scores:   PHQ-9:     Recent Review Flowsheet Data    There is no flowsheet data to display.      Psychosocial Evaluation and Intervention:   Psychosocial Re-Evaluation:  Education: Education Goals: Education classes will be provided on a weekly basis, covering  required topics. Participant will state understanding/return demonstration of topics presented.  Learning Barriers/Preferences:     Learning Barriers/Preferences - 04/19/15 1130    Learning Barriers/Preferences   Learning Barriers None   Learning Preferences Group Instruction;Individual  Instruction;Pictoral;Skilled Demonstration;Written Material;Video;Verbal Instruction      Education Topics: Initial Evaluation Education: - Verbal, written and demonstration of respiratory meds, RPE/PD scales, oximetry and breathing techniques. Instruction on use of nebulizers and MDIs: cleaning and proper use, rinsing mouth with steroid doses and importance of monitoring MDI activations.          Pulmonary Rehab from 05/06/2015 in [facility]   Date  04/19/15   Educator  LB   Instruction Review Code  2- meets goals/outcomes      General Nutrition Guidelines/Fats and Fiber: -Group instruction provided by verbal, written material, models and posters to present the general guidelines for heart healthy nutrition. Gives an explanation and review of dietary fats and fiber.   Controlling Sodium/Reading Food Labels: -Group verbal and written material supporting the discussion of sodium use in heart healthy nutrition. Review and explanation with models, verbal and written materials for utilization of the food label.   Exercise Physiology & Risk Factors: - Group verbal and written instruction with models to review the exercise physiology of the cardiovascular system and associated critical values. Details cardiovascular disease risk factors and the goals associated with each risk factor.   Aerobic Exercise & Resistance Training: - Gives group verbal and written discussion on the health impact of inactivity. On the components of aerobic and resistive training programs and the benefits of this training and how to safely progress through these programs.      Pulmonary Rehab from 05/06/2015 in [facility]   Date  04/27/15   Educator  SW   Instruction Review Code  2- meets goals/outcomes      Flexibility, Balance, General Exercise Guidelines: - Provides group verbal and written instruction on the benefits of flexibility  and balance training programs. Provides general exercise guidelines with specific guidelines to those with heart or lung disease. Demonstration and skill practice provided.   Stress Management: - Provides group verbal and written instruction about the health risks of elevated stress, cause of high stress, and healthy ways to reduce stress.   Depression: - Provides group verbal and written instruction on the correlation between heart/lung disease and depressed mood, treatment options, and the stigmas associated with seeking treatment.   Exercise & Equipment Safety: - Individual verbal instruction and demonstration of equipment use and safety with use of the equipment.      Pulmonary Rehab from 05/06/2015 in [facility]   Date  04/27/15   Educator  RM   Instruction Review Code  2- meets goals/outcomes      Infection Prevention: - Provides verbal and written material to individual with discussion of infection control including proper hand washing and proper equipment cleaning during exercise session.      Pulmonary Rehab from 05/06/2015 in [facility]   Date  04/27/15   Educator  LB   Instruction Review Code  2- meets goals/outcomes      Falls Prevention: - Provides verbal and written material to individual with discussion of falls prevention and safety.      Pulmonary Rehab from 05/06/2015 in [facility]   Date  04/19/15   Educator  LB   Instruction Review Code  2- meets goals/outcomes      Diabetes: - Individual verbal and written instruction to review signs/symptoms of diabetes, desired ranges of glucose level fasting, after meals and with exercise. Advice that pre and post exercise glucose checks will be done for 3 sessions at entry of program.      Pulmonary Rehab from 05/06/2015 in [facility]   Date  04/27/15   Educator  LB    Instruction Review Code  2- meets goals/outcomes      Chronic Lung Diseases: - Group verbal and written instruction to review new updates, new respiratory medications, new advancements in procedures and treatments. Provide informative websites and "800" numbers of self-education.   Lung Procedures: - Group verbal and written instruction to describe testing methods done to diagnose lung disease. Review the outcome of test results. Describe the treatment choices: Pulmonary Function Tests, ABGs and oximetry.   Energy Conservation: - Provide group verbal and written instruction for methods to conserve energy, plan and organize activities. Instruct on pacing techniques, use of adaptive equipment and posture/positioning to relieve shortness of breath.   Triggers: - Group verbal and written instruction to review types of environmental controls: home humidity, furnaces, filters, dust mite/pet prevention, HEPA vacuums. To discuss weather changes, air quality and the benefits of nasal washing.   Exacerbations: - Group verbal and written instruction to provide: warning signs, infection symptoms, calling MD promptly, preventive modes, and value of vaccinations. Review: effective airway clearance, coughing and/or vibration techniques. Create an Sport and exercise psychologist.   Oxygen: - Individual and group verbal and written instruction on oxygen therapy. Includes supplement oxygen, available portable oxygen systems, continuous and intermittent flow rates, oxygen safety, concentrators, and Medicare reimbursement for oxygen.      Pulmonary Rehab from 05/06/2015 in [facility]   Date  04/19/15   Educator  LB   Instruction Review Code  2- meets goals/outcomes      Respiratory Medications: - Group verbal and written instruction to review medications for lung disease. Drug class, frequency, complications, importance of spacers, rinsing mouth after steroid MDI's, and proper cleaning  methods for nebulizers.      Pulmonary Rehab from 05/06/2015 in [facility]   Date  05/06/15   Educator  SJ   Instruction Review Code  2- meets goals/outcomes [Spacer and instructions given]      AED/CPR: - Group verbal and written instruction with the use of models to demonstrate the basic use of the AED with the basic ABC's of resuscitation.   Breathing Retraining: - Provides individuals verbal and written instruction on purpose, frequency, and proper technique of diaphragmatic breathing and pursed-lipped breathing. Applies individual practice skills.      Pulmonary Rehab from 05/06/2015 in [facility]   Date  04/27/15   Educator  LB   Instruction Review Code  2- meets goals/outcomes      Anatomy and Physiology of the Lungs: - Group verbal and written instruction with the use of models to provide basic lung anatomy and physiology related to function, structure and complications of lung disease.   Heart Failure: - Group verbal and written instruction on the basics of heart failure: signs/symptoms, treatments, explanation of ejection fraction, enlarged heart and cardiomyopathy.   Sleep Apnea: - Individual verbal and written instruction to review Obstructive Sleep Apnea. Review of risk factors, methods for diagnosing and types of masks and machines for OSA.   Anxiety: - Provides  group, verbal and written instruction on the correlation between heart/lung disease and anxiety, treatment options, and management of anxiety.   Relaxation: - Provides group, verbal and written instruction about the benefits of relaxation for patients with heart/lung disease. Also provides patients with examples of relaxation techniques.   Knowledge Questionnaire Score:     Knowledge Questionnaire Score - 04/19/15 1130    Knowledge Questionnaire Score   Pre Score -3      Personal Goals and Risk Factors at Admission:      Personal Goals and Risk Factors at Admission - 04/19/15 1130    Personal Goals and Risk Factors on Admission    Weight Management Yes   Intervention Learn and follow the exercise and diet guidelines while in the program. Utilize the nutrition and education classes to help gain knowledge of the diet and exercise expectations in the program  Ms Fentress would like to meet with the dietitian.  Her goal is to lose 15lbs. She lives with her daughter and they do eat alot of fruits and vegetables; no salt.   Admit Weight 165 lb (74.844 kg)   Goal Weight 150 lb (68.04 kg)   Increase Aerobic Exercise and Physical Activity Yes   Intervention While in program, learn and follow the exercise prescription taught. Start at a low level workload and increase workload after able to maintain previous level for 30 minutes. Increase time before increasing intensity.  Ms Vaca does have a pedal machine. She would like to strength herself where she is not so dependent on her cane.   Understand more about Heart/Pulmonary Disease. Yes   Intervention While in program utilize professionals for any questions, and attend the education sessions. Great websites to use are www.americanheart.org or www.lung.org for reliable information.  Ms Bellotti has COPD and CHF and is interested in learning more to manage her diseases.   Improve shortness of breath with ADL's Yes   Intervention While in program, learn and follow the exercise prescription taught. Start at a low level workload and increase workload ad advised by the exercise physiologist. Increase time before increasing intensity.  Ms Petrovich scored a 7992 on the UCSD SOB Questionnaire. She would like to do more activity without shortness of breath.   Develop more efficient breathing techniques such as purse lipped breathing and diaphragmatic breathing; and practicing self-pacing with activity Yes   Intervention While in program, learn and utilize the specific breathing techniques  taught to you. Continue to practice and use the techniques as needed.   Increase knowledge of respiratory medications and ability to use respiratory devices properly.  Yes   Intervention While in program learn and demonstrate appropriate use of your oxygen therapy by increasing flow with exertion, manage oxygen tank operation, including continuous and intermittent flow.  Understanding oxygen is a drug ordered by your physician.;While in program, learn to administer MDI, nebulizer, and spacer properly.;Learn to take respiratory medicine as ordered.;While in program, learn to Clean MDI, nebulizers, and spacers properly.  Ms Suk has a good understanding of her Annie SableSpiriva, Dulera, ProAir, Spacer, and Albuterol  nebulizer.  She uses a gas portable O2 tank, 2l/m [place], and her service is from [facility].   Diabetes Yes   Hypertension Yes   Lipids Yes      Personal Goals and Risk Factors Review:    Personal Goals Discharge:    Comments: 30 day Review. Continue with ITP.

## 2015-05-09 NOTE — Progress Notes (Signed)
Daily Session Note  Patient Details  Name: Catherine Holmes MRN: [id number] Date of Birth: 1928-10-17 Referring Provider:  Dion Body, MD  Encounter Date: 05/09/2015  Check In:     Session Check In - 05/09/15 1533    Check-In   Staff Present Carson Myrtle BS, RRT, Respiratory Therapist;Steven Way BS, ACSM EP-C, Exercise Physiologist   ER physicians immediately available to respond to emergencies [facility] immediately available ER MD   Physician(s) Dr. Jacqualine Code and Jimmye Norman   Medication changes reported     No   Fall or balance concerns reported    No   Warm-up and Cool-down Performed on first and last piece of equipment   VAD Patient? No   Pain Assessment   Currently in Pain? No/denies           Exercise Prescription Changes - 05/09/15 1500    Exercise Review   Progression Yes   Response to Exercise   Blood Pressure (Admit) 120/78 mmHg   Blood Pressure (Exercise) 130/68 mmHg   Blood Pressure (Exit) 110/66 mmHg   Heart Rate (Admit) 65 bpm   Heart Rate (Exercise) 78 bpm   Heart Rate (Exit) 63 bpm   Oxygen Saturation (Admit) 93 %  2l/m [place]   Oxygen Saturation (Exercise) 88 %   Oxygen Saturation (Exit) 96 %   Rating of Perceived Exertion (Exercise) 15   Perceived Dyspnea (Exercise) 4   Resistance Training   Training Prescription Yes   Weight 1   Treadmill   MPH 1   Grade 0   Minutes 10   Recumbant Bike   Level 1   RPM 40   Minutes 10   NuStep   Level 2   Watts 40   Minutes 10      Goals Met:  reduced workloads because feeling tired today  Goals Unmet:  Not Applicable  Goals Comments: Resistive training was omitted today because she was too tired. Planning on two pieces of equipment as a possible goal for Catherine Holmes.   Dr. Emily Filbert is Medical Director for [place] [facility] and [facility].

## 2015-05-10 ENCOUNTER — Ambulatory Visit: Payer: Medicare Other | Admitting: Family

## 2015-05-11 ENCOUNTER — Telehealth: Payer: Self-pay | Admitting: *Deleted

## 2015-05-11 NOTE — Telephone Encounter (Signed)
Patient calling the office for samples of medication:   1. What medication and dosage are you requesting samples for? Zetia  2. Are you currentlyout of this medication? Pt had but a few days left.   3. Are you requesting samples to get you through until a mail order prescription arrives? No, it is 400 a month so dr Mariah MillingGollan is waiting for generic to come out and then we will switch her to that but for now they can't afford it.   Pt is needing samples not a refill, pt daughter is a bit upset we sent it instead of samples.

## 2015-05-11 NOTE — Telephone Encounter (Signed)
Currently out of Zetia samples will contact drug rep.

## 2015-05-12 ENCOUNTER — Other Ambulatory Visit: Payer: Self-pay

## 2015-05-12 DIAGNOSIS — D508 Other iron deficiency anemias: Secondary | ICD-10-CM

## 2015-05-13 ENCOUNTER — Inpatient Hospital Stay: Payer: Medicare Other

## 2015-05-13 ENCOUNTER — Inpatient Hospital Stay: Payer: Medicare Other | Attending: Hematology and Oncology

## 2015-05-13 ENCOUNTER — Inpatient Hospital Stay: Payer: Medicare Other | Admitting: Hematology and Oncology

## 2015-05-15 ENCOUNTER — Encounter: Payer: Self-pay | Admitting: Emergency Medicine

## 2015-05-15 ENCOUNTER — Emergency Department
Admission: EM | Admit: 2015-05-15 | Discharge: 2015-05-15 | Disposition: A | Discharge status: Home / Self Care | Payer: Medicare Other | Attending: Emergency Medicine | Admitting: Emergency Medicine

## 2015-05-15 ENCOUNTER — Other Ambulatory Visit: Payer: Self-pay

## 2015-05-15 DIAGNOSIS — Z880 Allergy status to penicillin: Secondary | ICD-10-CM | POA: Insufficient documentation

## 2015-05-15 DIAGNOSIS — Z87891 Personal history of nicotine dependence: Secondary | ICD-10-CM | POA: Diagnosis not present

## 2015-05-15 DIAGNOSIS — K219 Gastro-esophageal reflux disease without esophagitis: Secondary | ICD-10-CM | POA: Diagnosis not present

## 2015-05-15 DIAGNOSIS — R55 Syncope and collapse: Secondary | ICD-10-CM | POA: Diagnosis present

## 2015-05-15 DIAGNOSIS — E860 Dehydration: Secondary | ICD-10-CM | POA: Diagnosis not present

## 2015-05-15 DIAGNOSIS — E871 Hypo-osmolality and hyponatremia: Secondary | ICD-10-CM | POA: Diagnosis not present

## 2015-05-15 DIAGNOSIS — I10 Essential (primary) hypertension: Secondary | ICD-10-CM | POA: Diagnosis not present

## 2015-05-15 DIAGNOSIS — E119 Type 2 diabetes mellitus without complications: Secondary | ICD-10-CM | POA: Diagnosis not present

## 2015-05-15 LAB — CBC WITH DIFFERENTIAL/PLATELET
BASOS ABS: 0.1 10*3/uL (ref 0–0.1)
Basophils Relative: 1 %
EOS PCT: 0 %
Eosinophils Absolute: 0 10*3/uL (ref 0–0.7)
HCT: 31.5 % — ABNORMAL LOW (ref 35.0–47.0)
Hemoglobin: 10.1 g/dL — ABNORMAL LOW (ref 12.0–16.0)
Lymphocytes Relative: 10 %
Lymphs Abs: 1.1 10*3/uL (ref 1.0–3.6)
MCH: 27.3 pg (ref 26.0–34.0)
MCHC: 32 g/dL (ref 32.0–36.0)
MCV: 85.3 fL (ref 80.0–100.0)
MONO ABS: 0.8 10*3/uL (ref 0.2–0.9)
MONOS PCT: 7 %
NEUTROS PCT: 82 %
Neutro Abs: 8.7 10*3/uL — ABNORMAL HIGH (ref 1.4–6.5)
PLATELETS: 268 10*3/uL (ref 150–440)
RBC: 3.7 MIL/uL — ABNORMAL LOW (ref 3.80–5.20)
RDW: 22 % — AB (ref 11.5–14.5)
WBC: 10.6 10*3/uL (ref 3.6–11.0)

## 2015-05-15 LAB — COMPREHENSIVE METABOLIC PANEL
ALK PHOS: 66 U/L (ref 38–126)
ALT: 13 U/L — ABNORMAL LOW (ref 14–54)
ANION GAP: 9 (ref 5–15)
AST: 20 U/L (ref 15–41)
Albumin: 3.5 g/dL (ref 3.5–5.0)
BILIRUBIN TOTAL: 0.3 mg/dL (ref 0.3–1.2)
BUN: 15 mg/dL (ref 6–20)
CHLORIDE: 87 mmol/L — AB (ref 101–111)
CO2: 27 mmol/L (ref 22–32)
CREATININE: 0.79 mg/dL (ref 0.44–1.00)
Calcium: 8.2 mg/dL — ABNORMAL LOW (ref 8.9–10.3)
GFR calc Af Amer: >60 mL/min (ref >60)
GLUCOSE: 140 mg/dL — AB (ref 65–99)
POTASSIUM: 4.5 mmol/L (ref 3.5–5.1)
Sodium: 123 mmol/L — ABNORMAL LOW (ref 135–145)
Total Protein: 6 g/dL — ABNORMAL LOW (ref 6.5–8.1)

## 2015-05-15 LAB — LIPASE, BLOOD: Lipase: 17 U/L — ABNORMAL LOW (ref 22–51)

## 2015-05-15 LAB — TROPONIN I: Troponin I: <0.03 ng/mL (ref <0.031)

## 2015-05-15 MED ORDER — GI COCKTAIL ~~LOC~~
30.0000 mL | Freq: Once | ORAL | Status: AC
  Administered 2015-05-15: 30 mL via ORAL

## 2015-05-15 MED ORDER — SODIUM CHLORIDE 0.9 % IV BOLUS (SEPSIS)
1000.0000 mL | Freq: Once | INTRAVENOUS | Status: AC
  Administered 2015-05-15: 1000 mL via INTRAVENOUS

## 2015-05-15 MED ORDER — GI COCKTAIL ~~LOC~~
ORAL | Status: AC
  Filled 2015-05-15: qty 30

## 2015-05-15 NOTE — ED Notes (Signed)
Pt arrive via EMS . Pt was driving and stopped at EMS base because she felt weak after eating out. Pt had bp of 82/39. EMS drove her to hospital. Pt arrived on 2 L oxygen nasal cannula. Pt is alert and oriented x4 and in NAD.

## 2015-05-15 NOTE — ED Provider Notes (Signed)
[facility] Emergency Department Provider Note  ____________________________________________  Time seen: 6:20 PM on arrival by EMS  I have reviewed the triage vital signs and the nursing notes.   HISTORY  Chief Complaint Near Syncope and Weakness    HPI Catherine Holmes is a 81 y.o. female complains of weakness and lightheadedness after eating lunch. She drove herself to the EMS base but they noted her to be hypotensive and drove her to the ED. Patient states that she's not had an appetite the last 3 or 4 days has not been eating or drinking much. She was feeling tired when she went to bed last night more than usual. When she woke up this morning she was feeling malaise and nausea and didn't eat anything. She went to lunch with her daughter and only ate a few bites and felt very lightheaded at that time. She was trying to hide the symptoms from her daughter so she didn't mention it at the restaurant, but after leaving that she drove to the EMS base for evaluation. Denies any chest pain shortness of breath fevers chills syncope head injuries abdominal pain vomiting or diarrhea.     Past Medical History  Diagnosis Date  . Essential hypertension   . Diabetes mellitus type II, controlled   . COPD (chronic obstructive pulmonary disease)   . Osteoporosis   . Carotid arterial disease     a. 2012 Carotid dopplers: 40-59% R ICA, 60-79% L ICA  . Tic douloureux   . Arthralgia   . Sjoegren syndrome   . TIA (transient ischemic attack)   . Emphysema   . MVP (mitral valve prolapse)   . Chronic diastolic CHF (congestive heart failure)     a. 08/2011 Echo: EF 65-70%, mild LVH, mod dil LA, mildly dil RA, mild MR, mild-mod AoV Sclerosis w/o stenosis, mod-sev elev PA pressures, mild TR.  Marland Kitchen Acid reflux   . Diverticulitis   . Anemia   . History of pneumonia   . Syncope and collapse   . Anemia   . Hip fracture, left   . Hyperlipidemia   . Hiatal hernia   . Chest pain    a. 08/2011 MV: EF 74%, no ischemia.  . Noncompliance     a. h/o not taking bumex as Rx.  Marland Kitchen PAF (paroxysmal atrial fibrillation)     a. CHA2DS2VASc = 8 ->No OAC 2/2 falls.    Patient Active Problem List   Diagnosis Date Noted  . Hyponatremia 04/11/2015  . Syncope 04/10/2015  . Absolute anemia   . Acute on chronic diastolic CHF (congestive heart failure)   . CHF (congestive heart failure) 03/31/2015  . Essential hypertension   . Diabetes mellitus type II, controlled   . Hyposmolality and/or hyponatremia 12/04/2013  . Anemia 12/04/2013  . Chronic diastolic CHF (congestive heart failure) 04/05/2013  . Positive fecal occult blood test 04/05/2013  . Fall 11/20/2012  . Chest tightness 07/13/2011  . Diabetes type 2, uncontrolled 11/29/2009  . Hyperlipidemia 11/29/2009  . TIC DOULOUREUX 11/29/2009  . HYPERTENSION, BENIGN 11/29/2009  . MITRAL VALVE PROLAPSE 11/29/2009  . Congestive heart failure 11/29/2009  . Carotid arterial disease 11/29/2009  . TIA 11/29/2009  . PAD (peripheral artery disease) 11/29/2009  . EMPHYSEMA 11/29/2009  . COPD (chronic obstructive pulmonary disease) 11/29/2009  . GERD 11/29/2009  . SJOGREN'S SYNDROME 11/29/2009  . ARTHRALGIA 11/29/2009  . OSTEOPOROSIS 11/29/2009  . DYSPNEA 11/29/2009    Past Surgical History  Procedure Laterality Date  .  Cholecystectomy  1965  . Abdominal hysterectomy  1964  . Cataract extraction Bilateral   . Hip fracture surgery  2014    left hip    Current Outpatient Rx  Name  Route  Sig  Dispense  Refill  . albuterol (PROAIR HFA) 108 (90 BASE) MCG/ACT inhaler   Inhalation   Inhale 2 puffs into the lungs every 6 (six) hours as needed.   3 Inhaler   0     For further refills please refill with your primar ...   . albuterol (PROVENTIL) (2.5 MG/3ML) 0.083% nebulizer solution   Nebulization   Take 2.5 mg by nebulization every 6 (six) hours as needed for wheezing or shortness of breath.          Marland Kitchen arformoterol  (BROVANA) 15 MCG/2ML NEBU   Nebulization   Take 2 mLs (15 mcg total) by nebulization 2 (two) times daily.   120 mL   4   . aspirin EC 81 MG tablet   Oral   Take 81 mg by mouth daily.         Marland Kitchen BETA CAROTENE PO   Oral   Take by mouth 2 (two) times daily.         . beta carotene w/minerals (OCUVITE) tablet   Oral   Take 1 tablet by mouth 2 (two) times daily.         . bumetanide (BUMEX) 1 MG tablet   Oral   Take 1 tablet (1 mg total) by mouth daily. Take 1 tab daily   180 tablet   3   . calcium carbonate (OS-CAL) 600 MG TABS tablet   Oral   Take 600 mg by mouth daily with breakfast.         . Cholecalciferol (VITAMIN D-3 PO)   Oral   Take 1,000 Units by mouth.         . citalopram (CELEXA) 20 MG tablet   Oral   Take 20 mg by mouth daily.         . diphenhydramine-acetaminophen (TYLENOL PM) 25-500 MG TABS   Oral   Take 1 tablet by mouth at bedtime as needed.         Marland Kitchen erythromycin ophthalmic ointment   Both Eyes   Place 1 application into both eyes at bedtime.         Marland Kitchen ezetimibe (ZETIA) 10 MG tablet   Oral   Take 1 tablet (10 mg total) by mouth daily.   90 tablet   3   . fluticasone (FLONASE) 50 MCG/ACT nasal spray   Each Nare   Place 1 spray into both nostrils daily.         Marland Kitchen gabapentin (NEURONTIN) 100 MG capsule   Oral   Take 100 mg by mouth 2 (two) times daily.          Marland Kitchen HYDROcodone-acetaminophen (NORCO/VICODIN) 5-325 MG per tablet   Oral   Take 1 tablet by mouth as needed for severe pain.          . mometasone-formoterol (DULERA) 100-5 MCG/ACT AERO   Inhalation   Inhale 2 puffs into the lungs 2 (two) times daily.          . Multiple Vitamin (MULTIVITAMIN) tablet   Oral   Take 1 tablet by mouth daily.           . nebivolol (BYSTOLIC) 5 MG tablet   Oral   Take 1 tablet (5 mg total) by mouth daily.  30 tablet   0   . nitroGLYCERIN (NITROSTAT) 0.4 MG SL tablet   Sublingual   Place 1 tablet (0.4 mg total) under  the tongue every 5 (five) minutes as needed for chest pain.   25 tablet   6   . potassium chloride (K-DUR,KLOR-CON) 10 MEQ tablet   Oral   Take 5 mEq by mouth 2 (two) times daily as needed. Patient takes this when she takes Bumex.         . predniSONE (STERAPRED UNI-PAK 21 TAB) 10 MG (21) TBPK tablet   Oral   Take 1 tablet (10 mg total) by mouth daily. 6 tabs PO x 1 day 5 tabs PO x 1 day 4 tabs PO x 1 day 3 tabs PO x 1 day 2 tabs PO x 1 day 1 tab PO x 1 day and stop   21 tablet   0   . tiotropium (SPIRIVA) 18 MCG inhalation capsule   Inhalation   Place 1 capsule (18 mcg total) into inhaler and inhale daily.   90 capsule   0     For further refills please refill with your primar ...   . triamcinolone (KENALOG) 0.025 % cream   Topical   Apply 1 application topically daily as needed (for arms).         . trimethoprim-polymyxin b (POLYTRIM) ophthalmic solution   Both Eyes   Place 1 drop into both eyes 2 (two) times daily.           Allergies Atorvastatin; Codeine; Doxycycline; Epinephrine; Erythromycin; Fluticasone-salmeterol; Lasix; Levofloxacin; Penicillins; Propoxyphene hcl; Rosuvastatin; Simvastatin; Singlet; Sulfamethoxazole-trimethoprim; Teriparatide; Cefaclor; Glipizide; and Lisinopril  Family History  Problem Relation Age of Onset  . Heart attack Mother   . Heart attack Father   . Heart attack Sister     Social History History  Substance Use Topics  . Smoking status: Former Smoker -- 0.50 packs/day for 30 years    Types: Cigarettes    Quit date: 10/22/1994  . Smokeless tobacco: Never Used  . Alcohol Use: No    Review of Systems  Constitutional: No fever or chills. No weight changes Eyes:No blurry vision or double vision.  ENT: No sore throat. Cardiovascular: No chest pain. Respiratory: No dyspnea or cough. Gastrointestinal: Negative for abdominal pain, vomiting and diarrhea.  No BRBPR or melena. Genitourinary: Negative for dysuria, urinary  retention, bloody urine, or difficulty urinating. Musculoskeletal: Negative for back pain. No joint swelling or pain. Skin: Negative for rash. Neurological: Negative for headaches, focal weakness or numbness. Positive lightheadedness Psychiatric:No anxiety or depression.   Endocrine:No hot/cold intolerance, changes in energy, or sleep difficulty.  10-point ROS otherwise negative.  ____________________________________________   PHYSICAL EXAM:  VITAL SIGNS: ED Triage Vitals  Enc Vitals Group     BP 05/15/15 1832 109/72 mmHg     Pulse Rate 05/15/15 1832 69     Resp 05/15/15 1832 20     Temp 05/15/15 1832 98.7 F (37.1 C)     Temp Source 05/15/15 1832 Oral     SpO2 05/15/15 1832 95 %     Weight 05/15/15 1832 171 lb 3 oz (77.65 kg)     Height 05/15/15 1832 5\' 3"  (1.6 m)     Head Cir --      Peak Flow --      Pain Score 05/15/15 1834 0     Pain Loc --      Pain Edu? --      Excl. in  GC? --    Positive orthostatic symptoms by EMS with standing blood pressure of 82/39  Constitutional: Alert and oriented. Well appearing and in no distress. Eyes: No scleral icterus. No conjunctival pallor. PERRL. EOMI ENT   Head: Normocephalic and atraumatic.   Nose: No congestion/rhinnorhea. No septal hematoma   Mouth/Throat: Dry mucous membranes, no pharyngeal erythema. No peritonsillar mass. No uvula shift.   Neck: No stridor. No SubQ emphysema. No meningismus. Hematological/Lymphatic/Immunilogical: No cervical lymphadenopathy. Cardiovascular: RRR. Normal and symmetric distal pulses are present in all extremities. No murmurs, rubs, or gallops. Respiratory: Normal respiratory effort without tachypnea nor retractions. Breath sounds are clear and equal bilaterally. No wheezes/rales/rhonchi. Gastrointestinal: Soft and nontender. No distention. There is no CVA tenderness.  No rebound, rigidity, or guarding. Genitourinary: deferred Musculoskeletal: Nontender with normal range of motion  in all extremities. No joint effusions.  No lower extremity tenderness.  No edema. Neurologic:   Normal speech and language.  CN 2-10 normal. Motor grossly intact. No pronator drift.  Normal gait. No gross focal neurologic deficits are appreciated.  Skin:  Skin is warm, dry and intact. No rash noted.  No petechiae, purpura, or bullae. Psychiatric: Mood and affect are normal. Speech and behavior are normal. Patient exhibits appropriate insight and judgment.  ____________________________________________    LABS (pertinent positives/negatives) (all labs ordered are listed, but only abnormal results are displayed) Labs Reviewed  COMPREHENSIVE METABOLIC PANEL - Abnormal; Notable for the following:    Sodium 123 (*)    Chloride 87 (*)    Glucose, Bld 140 (*)    Calcium 8.2 (*)    Total Protein 6.0 (*)    ALT 13 (*)    All other components within normal limits  LIPASE, BLOOD - Abnormal; Notable for the following:    Lipase 17 (*)    All other components within normal limits  CBC WITH DIFFERENTIAL/PLATELET - Abnormal; Notable for the following:    RBC 3.70 (*)    Hemoglobin 10.1 (*)    HCT 31.5 (*)    RDW 22.0 (*)    Neutro Abs 8.7 (*)    All other components within normal limits  TROPONIN I   ____________________________________________   EKG  Interpreted by me Normal sinus rhythm rate of 64, normal axis intervals, poor R-wave progression in anterior precordial leads, normal ST segments and T waves.  ____________________________________________    RADIOLOGY    ____________________________________________   PROCEDURES  ____________________________________________   INITIAL IMPRESSION / ASSESSMENT AND PLAN / ED COURSE  Pertinent labs & imaging results that were available during my care of the patient were reviewed by me and considered in my medical decision making (see chart for details).  Patient presents with symptoms consistent with dehydration. Low suspicion  for ACS PE TAD pneumothorax carditis mediastinitis pneumonia or sepsis. Low suspicion for stroke or vascular phenomenon. No evidence of surgical abdomen, mesenteric ischemia, AAA. Overall well-appearing no acute distress. We'll give IV fluids while checking labs.  ----------------------------------------- 8:04 PM on 05/15/2015 ----------------------------------------- Patient remained stable in the ED. She is feeling more energetic and improved. She also reports some chest discomfort consistent with chronic GERD that she normally takes Zantac for it. We'll give her a GI cocktail. Her labs are significant for a hyponatremia with a sodium of 123, which is chronic compared to previous results. Otherwise labs are unremarkable and troponin is negative.   ____________________________________________   FINAL CLINICAL IMPRESSION(S) / ED DIAGNOSES  Final diagnoses:  Chronic hyponatremia  Dehydration  Gastroesophageal reflux  disease, esophagitis presence not specified      Sharman Cheek, MD 05/15/15 2004

## 2015-05-15 NOTE — Discharge Instructions (Signed)
Dehydration, Adult °Dehydration is when you lose more fluids from the body than you take in. Vital organs like the kidneys, brain, and heart cannot function without a proper amount of fluids and salt. Any loss of fluids from the body can cause dehydration.  °CAUSES  °· Vomiting. °· Diarrhea. °· Excessive sweating. °· Excessive urine output. °· Fever. °SYMPTOMS  °Mild dehydration °· Thirst. °· Dry lips. °· Slightly dry mouth. °Moderate dehydration °· Very dry mouth. °· Sunken eyes. °· Skin does not bounce back quickly when lightly pinched and released. °· Dark urine and decreased urine production. °· Decreased tear production. °· Headache. °Severe dehydration °· Very dry mouth. °· Extreme thirst. °· Rapid, weak pulse (more than 100 beats per minute at rest). °· Cold hands and feet. °· Not able to sweat in spite of heat and temperature. °· Rapid breathing. °· Blue lips. °· Confusion and lethargy. °· Difficulty being awakened. °· Minimal urine production. °· No tears. °DIAGNOSIS  °Your caregiver will diagnose dehydration based on your symptoms and your exam. Blood and urine tests will help confirm the diagnosis. The diagnostic evaluation should also identify the cause of dehydration. °TREATMENT  °Treatment of mild or moderate dehydration can often be done at home by increasing the amount of fluids that you drink. It is best to drink small amounts of fluid more often. Drinking too much at one time can make vomiting worse. Refer to the home care instructions below. °Severe dehydration needs to be treated at the hospital where you will probably be given intravenous (IV) fluids that contain water and electrolytes. °HOME CARE INSTRUCTIONS  °· Ask your caregiver about specific rehydration instructions. °· Drink enough fluids to keep your urine clear or pale yellow. °· Drink small amounts frequently if you have nausea and vomiting. °· Eat as you normally do. °· Avoid: °· Foods or drinks high in sugar. °· Carbonated  drinks. °· Juice. °· Extremely hot or cold fluids. °· Drinks with caffeine. °· Fatty, greasy foods. °· Alcohol. °· Tobacco. °· Overeating. °· Gelatin desserts. °· Wash your hands well to avoid spreading bacteria and viruses. °· Only take over-the-counter or prescription medicines for pain, discomfort, or fever as directed by your caregiver. °· Ask your caregiver if you should continue all prescribed and over-the-counter medicines. °· Keep all follow-up appointments with your caregiver. °SEEK MEDICAL CARE IF: °· You have abdominal pain and it increases or stays in one area (localizes). °· You have a rash, stiff neck, or severe headache. °· You are irritable, sleepy, or difficult to awaken. °· You are weak, dizzy, or extremely thirsty. °SEEK IMMEDIATE MEDICAL CARE IF:  °· You are unable to keep fluids down or you get worse despite treatment. °· You have frequent episodes of vomiting or diarrhea. °· You have blood or green matter (bile) in your vomit. °· You have blood in your stool or your stool looks black and tarry. °· You have not urinated in 6 to 8 hours, or you have only urinated a small amount of very dark urine. °· You have a fever. °· You faint. °MAKE SURE YOU:  °· Understand these instructions. °· Will watch your condition. °· Will get help right away if you are not doing well or get worse. °Document Released: 10/08/2005 Document Revised: 12/31/2011 Document Reviewed: 05/28/2011 °Exit[employer]® Patient Information ©2015 [employer]. This information is not intended to replace advice given to you by your health care provider. Make sure you discuss any questions you have with your health care   provider. ° °Hyponatremia  °Hyponatremia is when the amount of salt (sodium) in your blood is too low. When sodium levels are low, your cells will absorb extra water and swell. The swelling happens throughout the body, but it mostly affects the brain. Severe brain swelling (cerebral edema), seizures, or coma can happen.   °CAUSES  °· Heart, kidney, or liver problems. °· Thyroid problems. °· Adrenal gland problems. °· Severe vomiting and diarrhea. °· Certain medicines or illegal drugs. °· Dehydration. °· Drinking too much water. °· Low-sodium diet. °SYMPTOMS  °· Nausea and vomiting. °· Confusion. °· Lethargy. °· Agitation. °· Headache. °· Twitching or shaking (seizures). °· Unconsciousness. °· Appetite loss. °· Muscle weakness and cramping. °DIAGNOSIS  °Hyponatremia is identified by a simple blood test. Your caregiver will perform a history and physical exam to try to find the cause and type of hyponatremia. Other tests may be needed to measure the amount of sodium in your blood and urine. °TREATMENT  °Treatment will depend on the cause.  °· Fluids may be given through the vein (IV). °· Medicines may be used to correct the sodium imbalance. If medicines are causing the problem, they will need to be adjusted. °· Water or fluid intake may be restricted to restore proper balance. °The speed of correcting the sodium problem is very important. If the problem is corrected too fast, nerve damage (sometimes unchangeable) can happen. °HOME CARE INSTRUCTIONS  °· Only take medicines as directed by your caregiver. Many medicines can make hyponatremia worse. Discuss all your medicines with your caregiver. °· Carefully follow any recommended diet, including any fluid restrictions. °· You may be asked to repeat lab tests. Follow these directions. °· Avoid alcohol and recreational drugs. °SEEK MEDICAL CARE IF:  °· You develop worsening nausea, fatigue, headache, confusion, or weakness. °· Your original hyponatremia symptoms return. °· You have problems following the recommended diet. °SEEK IMMEDIATE MEDICAL CARE IF:  °· You have a seizure. °· You faint. °· You have ongoing diarrhea or vomiting. °MAKE SURE YOU:  °· Understand these instructions. °· Will watch your condition. °· Will get help right away if you are not doing well or get  worse. °Document Released: 09/28/2002 Document Revised: 12/31/2011 Document Reviewed: 03/25/2011 °ExitCare® Patient Information ©2015 [employer]. This information is not intended to replace advice given to you by your health care provider. Make sure you discuss any questions you have with your health care provider. ° °

## 2015-05-15 NOTE — ED Notes (Signed)
Pt complains of mid sternal chest pain, states "i get bad reflux after i eat, it's like that right now, maybe about an eight." skin normal color warm and dry. Dr. Scotty Court notified. Order for gi cocktail received.

## 2015-05-16 ENCOUNTER — Telehealth: Payer: Self-pay | Admitting: Internal Medicine

## 2015-05-16 NOTE — Telephone Encounter (Signed)
Due to nausea and ER visit on 05/16/2015 for dehydration, low sodium, and BP, Catherine Holmes has  not attended [facility], but she will return as soon as possible.

## 2015-05-22 ENCOUNTER — Emergency Department: Payer: Medicare Other

## 2015-05-22 ENCOUNTER — Other Ambulatory Visit: Payer: Self-pay

## 2015-05-22 ENCOUNTER — Emergency Department
Admission: EM | Admit: 2015-05-22 | Discharge: 2015-05-23 | Disposition: A | Discharge status: Home / Self Care | Payer: Medicare Other | Attending: Emergency Medicine | Admitting: Emergency Medicine

## 2015-05-22 ENCOUNTER — Encounter: Payer: Self-pay | Admitting: *Deleted

## 2015-05-22 DIAGNOSIS — R079 Chest pain, unspecified: Secondary | ICD-10-CM | POA: Diagnosis not present

## 2015-05-22 DIAGNOSIS — Z7951 Long term (current) use of inhaled steroids: Secondary | ICD-10-CM | POA: Insufficient documentation

## 2015-05-22 DIAGNOSIS — Z79899 Other long term (current) drug therapy: Secondary | ICD-10-CM | POA: Insufficient documentation

## 2015-05-22 DIAGNOSIS — E119 Type 2 diabetes mellitus without complications: Secondary | ICD-10-CM | POA: Diagnosis not present

## 2015-05-22 DIAGNOSIS — I5033 Acute on chronic diastolic (congestive) heart failure: Secondary | ICD-10-CM | POA: Diagnosis not present

## 2015-05-22 DIAGNOSIS — R0789 Other chest pain: Secondary | ICD-10-CM | POA: Diagnosis present

## 2015-05-22 DIAGNOSIS — Z880 Allergy status to penicillin: Secondary | ICD-10-CM | POA: Diagnosis not present

## 2015-05-22 DIAGNOSIS — R110 Nausea: Secondary | ICD-10-CM | POA: Diagnosis not present

## 2015-05-22 DIAGNOSIS — I10 Essential (primary) hypertension: Secondary | ICD-10-CM | POA: Insufficient documentation

## 2015-05-22 DIAGNOSIS — Z7982 Long term (current) use of aspirin: Secondary | ICD-10-CM | POA: Diagnosis not present

## 2015-05-22 DIAGNOSIS — Z7952 Long term (current) use of systemic steroids: Secondary | ICD-10-CM | POA: Insufficient documentation

## 2015-05-22 DIAGNOSIS — Z87891 Personal history of nicotine dependence: Secondary | ICD-10-CM | POA: Diagnosis not present

## 2015-05-22 LAB — CBC
HEMATOCRIT: 32.2 % — AB (ref 35.0–47.0)
Hemoglobin: 10.4 g/dL — ABNORMAL LOW (ref 12.0–16.0)
MCH: 27.7 pg (ref 26.0–34.0)
MCHC: 32.2 g/dL (ref 32.0–36.0)
MCV: 86 fL (ref 80.0–100.0)
Platelets: 284 10*3/uL (ref 150–440)
RBC: 3.75 MIL/uL — ABNORMAL LOW (ref 3.80–5.20)
RDW: 21.6 % — AB (ref 11.5–14.5)
WBC: 14.6 10*3/uL — ABNORMAL HIGH (ref 3.6–11.0)

## 2015-05-22 LAB — BASIC METABOLIC PANEL
Anion gap: 7 (ref 5–15)
BUN: 16 mg/dL (ref 6–20)
CO2: 32 mmol/L (ref 22–32)
Calcium: 8.7 mg/dL — ABNORMAL LOW (ref 8.9–10.3)
Chloride: 88 mmol/L — ABNORMAL LOW (ref 101–111)
Creatinine, Ser: 0.59 mg/dL (ref 0.44–1.00)
GFR calc Af Amer: >60 mL/min (ref >60)
GFR calc non Af Amer: >60 mL/min (ref >60)
GLUCOSE: 132 mg/dL — AB (ref 65–99)
POTASSIUM: 5.3 mmol/L — AB (ref 3.5–5.1)
Sodium: 127 mmol/L — ABNORMAL LOW (ref 135–145)

## 2015-05-22 LAB — TROPONIN I
Troponin I: <0.03 ng/mL (ref <0.031)
Troponin I: <0.03 ng/mL (ref <0.031)

## 2015-05-22 MED ORDER — SODIUM POLYSTYRENE SULFONATE 15 GM/60ML PO SUSP
30.0000 g | Freq: Once | ORAL | Status: AC
  Administered 2015-05-22: 30 g via ORAL
  Filled 2015-05-22: qty 120

## 2015-05-22 NOTE — ED Provider Notes (Signed)
[facility] Emergency Department Provider Note   ____________________________________________  Time seen: 9:40 AM I have reviewed the triage vital signs and the triage nursing note.  HISTORY  Chief Complaint Chest Pain   Historian Patient  HPI Catherine Holmes is a 81 y.o. female who arrived by EMS after waking up around 6 AM with central chest pain which she described as pressure and burning which was severe associated with nausea, without extension into the neck jaw or arm. She states it did not seem like it was acid reflux or indigestion. No report of prior MI, but history of congestive heart failure for which she follows with Dr. Mariah Milling.  Denies abdominal pain. She takes baby aspirin daily. Patient reports that after being given 325 aspirin and Zofran in the ambulance that she got resolution of the discomfort. No discomfort or pain now.   Past Medical History  Diagnosis Date  . Essential hypertension   . Diabetes mellitus type II, controlled   . COPD (chronic obstructive pulmonary disease)   . Osteoporosis   . Carotid arterial disease     a. 2012 Carotid dopplers: 40-59% R ICA, 60-79% L ICA  . Tic douloureux   . Arthralgia   . Sjoegren syndrome   . TIA (transient ischemic attack)   . Emphysema   . MVP (mitral valve prolapse)   . Chronic diastolic CHF (congestive heart failure)     a. 08/2011 Echo: EF 65-70%, mild LVH, mod dil LA, mildly dil RA, mild MR, mild-mod AoV Sclerosis w/o stenosis, mod-sev elev PA pressures, mild TR.  Marland Kitchen Acid reflux   . Diverticulitis   . Anemia   . History of pneumonia   . Syncope and collapse   . Anemia   . Hip fracture, left   . Hyperlipidemia   . Hiatal hernia   . Chest pain     a. 08/2011 MV: EF 74%, no ischemia.  . Noncompliance     a. h/o not taking bumex as Rx.  Marland Kitchen PAF (paroxysmal atrial fibrillation)     a. CHA2DS2VASc = 8 ->No OAC 2/2 falls.    Patient Active Problem List   Diagnosis Date Noted  .  Hyponatremia 04/11/2015  . Syncope 04/10/2015  . Absolute anemia   . Acute on chronic diastolic CHF (congestive heart failure)   . CHF (congestive heart failure) 03/31/2015  . Essential hypertension   . Diabetes mellitus type II, controlled   . Hyposmolality and/or hyponatremia 12/04/2013  . Anemia 12/04/2013  . Chronic diastolic CHF (congestive heart failure) 04/05/2013  . Positive fecal occult blood test 04/05/2013  . Fall 11/20/2012  . Chest tightness 07/13/2011  . Diabetes type 2, uncontrolled 11/29/2009  . Hyperlipidemia 11/29/2009  . TIC DOULOUREUX 11/29/2009  . HYPERTENSION, BENIGN 11/29/2009  . MITRAL VALVE PROLAPSE 11/29/2009  . Congestive heart failure 11/29/2009  . Carotid arterial disease 11/29/2009  . TIA 11/29/2009  . PAD (peripheral artery disease) 11/29/2009  . EMPHYSEMA 11/29/2009  . COPD (chronic obstructive pulmonary disease) 11/29/2009  . GERD 11/29/2009  . SJOGREN'S SYNDROME 11/29/2009  . ARTHRALGIA 11/29/2009  . OSTEOPOROSIS 11/29/2009  . DYSPNEA 11/29/2009    Past Surgical History  Procedure Laterality Date  . Cholecystectomy  1965  . Abdominal hysterectomy  1964  . Cataract extraction Bilateral   . Hip fracture surgery  2014    left hip    Current Outpatient Rx  Name  Route  Sig  Dispense  Refill  . albuterol (PROAIR HFA) 108 (90  BASE) MCG/ACT inhaler   Inhalation   Inhale 2 puffs into the lungs every 6 (six) hours as needed.   3 Inhaler   0     For further refills please refill with your primar ...   . arformoterol (BROVANA) 15 MCG/2ML NEBU   Nebulization   Take 2 mLs (15 mcg total) by nebulization 2 (two) times daily.   120 mL   4   . bumetanide (BUMEX) 1 MG tablet   Oral   Take 1 tablet (1 mg total) by mouth daily. Take 1 tab daily   180 tablet   3   . ezetimibe (ZETIA) 10 MG tablet   Oral   Take 1 tablet (10 mg total) by mouth daily.   90 tablet   3   . albuterol (PROVENTIL) (2.5 MG/3ML) 0.083% nebulizer solution    Nebulization   Take 2.5 mg by nebulization every 6 (six) hours as needed for wheezing or shortness of breath.          Marland Kitchen aspirin EC 81 MG tablet   Oral   Take 81 mg by mouth daily.         Marland Kitchen BETA CAROTENE PO   Oral   Take by mouth 2 (two) times daily.         . beta carotene w/minerals (OCUVITE) tablet   Oral   Take 1 tablet by mouth 2 (two) times daily.         . calcium carbonate (OS-CAL) 600 MG TABS tablet   Oral   Take 600 mg by mouth daily with breakfast.         . Cholecalciferol (VITAMIN D-3 PO)   Oral   Take 1,000 Units by mouth.         . citalopram (CELEXA) 20 MG tablet   Oral   Take 20 mg by mouth daily.         . diphenhydramine-acetaminophen (TYLENOL PM) 25-500 MG TABS   Oral   Take 1 tablet by mouth at bedtime as needed.         Marland Kitchen erythromycin ophthalmic ointment   Both Eyes   Place 1 application into both eyes at bedtime.         . fluticasone (FLONASE) 50 MCG/ACT nasal spray   Each Nare   Place 1 spray into both nostrils daily.         Marland Kitchen gabapentin (NEURONTIN) 100 MG capsule   Oral   Take 100 mg by mouth 2 (two) times daily.          Marland Kitchen HYDROcodone-acetaminophen (NORCO/VICODIN) 5-325 MG per tablet   Oral   Take 1 tablet by mouth as needed for severe pain.          . mometasone-formoterol (DULERA) 100-5 MCG/ACT AERO   Inhalation   Inhale 2 puffs into the lungs 2 (two) times daily.          . Multiple Vitamin (MULTIVITAMIN) tablet   Oral   Take 1 tablet by mouth daily.           . nebivolol (BYSTOLIC) 5 MG tablet   Oral   Take 1 tablet (5 mg total) by mouth daily.   30 tablet   0   . nitroGLYCERIN (NITROSTAT) 0.4 MG SL tablet   Sublingual   Place 1 tablet (0.4 mg total) under the tongue every 5 (five) minutes as needed for chest pain.   25 tablet   6   . potassium chloride (  K-DUR,KLOR-CON) 10 MEQ tablet   Oral   Take 5 mEq by mouth 2 (two) times daily as needed. Patient takes this when she takes Bumex.          . predniSONE (STERAPRED UNI-PAK 21 TAB) 10 MG (21) TBPK tablet   Oral   Take 1 tablet (10 mg total) by mouth daily. 6 tabs PO x 1 day 5 tabs PO x 1 day 4 tabs PO x 1 day 3 tabs PO x 1 day 2 tabs PO x 1 day 1 tab PO x 1 day and stop   21 tablet   0   . tiotropium (SPIRIVA) 18 MCG inhalation capsule   Inhalation   Place 1 capsule (18 mcg total) into inhaler and inhale daily.   90 capsule   0     For further refills please refill with your primar ...   . triamcinolone (KENALOG) 0.025 % cream   Topical   Apply 1 application topically daily as needed (for arms).         . trimethoprim-polymyxin b (POLYTRIM) ophthalmic solution   Both Eyes   Place 1 drop into both eyes 2 (two) times daily.           Allergies Atorvastatin; Codeine; Doxycycline; Epinephrine; Erythromycin; Fluticasone-salmeterol; Lasix; Levofloxacin; Penicillins; Propoxyphene hcl; Rosuvastatin; Simvastatin; Singlet; Sulfamethoxazole-trimethoprim; Teriparatide; Cefaclor; Glipizide; and Lisinopril  Family History  Problem Relation Age of Onset  . Heart attack Mother   . Heart attack Father   . Heart attack Sister     Social History History  Substance Use Topics  . Smoking status: Former Smoker -- 0.50 packs/day for 30 years    Types: Cigarettes    Quit date: 10/22/1994  . Smokeless tobacco: Never Used  . Alcohol Use: No    Review of Systems  Constitutional: Negative for fever. Eyes: Negative for visual changes. ENT: Negative for sore throat. Cardiovascular: Negative for palpitations Respiratory: Negative for shortness of breath. Gastrointestinal: Negative for abdominal pain or diarrhea. Genitourinary: Negative for dysuria. Musculoskeletal: Negative for back pain. Skin: Negative for rash. Neurological: Negative for headaches, focal weakness or numbness. 10 point Review of Systems otherwise negative ____________________________________________   PHYSICAL EXAM:  VITAL SIGNS: ED Triage  Vitals  Enc Vitals Group     BP 05/22/15 0918 125/51 mmHg     Pulse Rate 05/22/15 0918 66     Resp --      Temp 05/22/15 0918 97.9 F (36.6 C)     Temp Source 05/22/15 0918 Oral     SpO2 05/22/15 0913 95 %     Weight 05/22/15 0918 171 lb (77.565 kg)     Height 05/22/15 0918 5' (1.524 m)     Head Cir --      Peak Flow --      Pain Score 05/22/15 0919 0     Pain Loc --      Pain Edu? --      Excl. in GC? --      Constitutional: Alert and oriented. Well appearing and in no distress. Eyes: Conjunctivae are normal. PERRL. Normal extraocular movements. ENT   Head: Normocephalic and atraumatic.   Nose: No congestion/rhinnorhea.   Mouth/Throat: Mucous membranes are moist.   Neck: No stridor. Cardiovascular/Chest: Chest nontender to palpation. Normal rate, regular rhythm.  No murmurs, rubs, or gallops. Respiratory: Normal respiratory effort without tachypnea nor retractions. Breath sounds are clear and equal bilaterally. No wheezes/rales/rhonchi. Gastrointestinal: Soft. No distention, no guarding, no rebound. Nontender  Genitourinary/rectal:Deferred Musculoskeletal: Nontender with normal range of motion in all extremities. No joint effusions.  No lower extremity tenderness nor edema. Neurologic:  Normal speech and language. No gross or focal neurologic deficits are appreciated. Skin:  Skin is warm, dry and intact. No rash noted. Psychiatric: Mood and affect are normal. Speech and behavior are normal. Patient exhibits appropriate insight and judgment.  ____________________________________________   EKG I, Governor Rooks, MD, the attending physician have personally viewed and interpreted all ECGs.  61 bpm. Undetermined rhythm due to interference on baseline. Narrow QRS normal axis. Nonspecific T-wave ____________________________________________  LABS (pertinent positives/negatives)  Metabolic panel significant for sodium 127, potassium 5.3, chloride 88 Troponin less  than 0.03 White blood count 14.6, hemoglobin 10.4  ____________________________________________  RADIOLOGY All Xrays were viewed by me. Imaging interpreted by Radiologist.  Chest x-ray portable: Negative __________________________________________  PROCEDURES  Procedure(s) performed: None Critical Care performed: None  ____________________________________________   ED COURSE / ASSESSMENT AND PLAN  CONSULTATIONS: None  Pertinent labs & imaging results that were available during my care of the patient were reviewed by me and considered in my medical decision making (see chart for details).   Atypical chest discomfort which is gone by the time patient rested emergency department. EKG unremarkable and initial troponin negative with repeat troponin negative as well. Patient has chronic hyponatremia and has had a history of mildly elevated potassium past. I will give her dose of Kayexalate.  Follow-up with cardiologist in 1-2 days.  Patient / Family / Caregiver informed of clinical course, medical decision-making process, and agree with plan.   I discussed return precautions, follow-up instructions, and discharged instructions with patient and/or family.  ___________________________________________   FINAL CLINICAL IMPRESSION(S) / ED DIAGNOSES   Final diagnoses:  Chest pain, unspecified chest pain type    FOLLOW UP  Referred to: Cardiologist in 1-2 days.   Governor Rooks, MD 05/22/15 1455

## 2015-05-22 NOTE — Discharge Instructions (Signed)
No certain cause. Your chest discomfort, however your exam and evaluation are reassuring. Your sodium level was low, but this is similar to what has been in the past. Discussed with her primary care physician if in one week. Your potassium level was minimally elevated and your given a dose of Kayexalate to help bind the extra potassium today. For the chest discomfort you should follow-up with a cardiologist and you're referred to Dr. Lewie Loron if you do not have your own cardiologist to be seen within 1-2 days.  Return to the emergency department for any new or worsening condition and [place] chest pain, trouble breathing, shortness breath, dizziness, passing out, or any other symptoms concerning to you.   Chest Pain (Nonspecific) It is often hard to give a specific diagnosis for the cause of chest pain. There is always a chance that your pain could be related to something serious, such as a heart attack or a blood clot in the lungs. You need to follow up with your health care provider for further evaluation. CAUSES   Heartburn.  Pneumonia or bronchitis.  Anxiety or stress.  Inflammation around your heart (pericarditis) or lung (pleuritis or pleurisy).  A blood clot in the lung.  A collapsed lung (pneumothorax). It can develop suddenly on its own (spontaneous pneumothorax) or from trauma to the chest.  Shingles infection (herpes zoster virus). The chest wall is composed of bones, muscles, and cartilage. Any of these can be the source of the pain.  The bones can be bruised by injury.  The muscles or cartilage can be strained by coughing or overwork.  The cartilage can be affected by inflammation and become sore (costochondritis). DIAGNOSIS  Lab tests or other studies may be needed to find the cause of your pain. Your health care provider may have you take a test called an ambulatory electrocardiogram (ECG). An ECG records your heartbeat patterns over a 24-hour period. You may also have other  tests, such as:  Transthoracic echocardiogram (TTE). During echocardiography, sound waves are used to evaluate how blood flows through your heart.  Transesophageal echocardiogram (TEE).  Cardiac monitoring. This allows your health care provider to monitor your heart rate and rhythm in real time.  Holter monitor. This is a portable device that records your heartbeat and can help diagnose heart arrhythmias. It allows your health care provider to track your heart activity for several days, if needed.  Stress tests by exercise or by giving medicine that makes the heart beat faster. TREATMENT   Treatment depends on what may be causing your chest pain. Treatment may include:  Acid blockers for heartburn.  Anti-inflammatory medicine.  Pain medicine for inflammatory conditions.  Antibiotics if an infection is present.  You may be advised to change lifestyle habits. This includes stopping smoking and avoiding alcohol, caffeine, and chocolate.  You may be advised to keep your head raised (elevated) when sleeping. This reduces the chance of acid going backward from your stomach into your esophagus. Most of the time, nonspecific chest pain will improve within 2-3 days with rest and mild pain medicine.  HOME CARE INSTRUCTIONS   If antibiotics were prescribed, take them as directed. Finish them even if you start to feel better.  For the next few days, avoid physical activities that bring on chest pain. Continue physical activities as directed.  Do not use any tobacco products, including cigarettes, chewing tobacco, or electronic cigarettes.  Avoid drinking alcohol.  Only take medicine as directed by your health care provider.  Follow your health care provider's suggestions for further testing if your chest pain does not go away.  Keep any follow-up appointments you made. If you do not go to an appointment, you could develop lasting (chronic) problems with pain. If there is any problem  keeping an appointment, call to reschedule. SEEK MEDICAL CARE IF:   Your chest pain does not go away, even after treatment.  You have a rash with blisters on your chest.  You have a fever. SEEK IMMEDIATE MEDICAL CARE IF:   You have increased chest pain or pain that spreads to your arm, neck, jaw, back, or abdomen.  You have shortness of breath.  You have an increasing cough, or you cough up blood.  You have severe back or abdominal pain.  You feel nauseous or vomit.  You have severe weakness.  You faint.  You have chills. This is an emergency. Do not wait to see if the pain will go away. Get medical help at once. Call your local emergency services (911 in [place] Do not drive yourself to the hospital. MAKE SURE YOU:   Understand these instructions.  Will watch your condition.  Will get help right away if you are not doing well or get worse. Document Released: 07/18/2005 Document Revised: 10/13/2013 Document Reviewed: 05/13/2008 [facility] Patient Information 2015 [place], [place]. This information is not intended to replace advice given to you by your health care provider. Make sure you discuss any questions you have with your health care provider.

## 2015-05-22 NOTE — ED Notes (Signed)
Pt c/o of a burning sensation in her chest this AM, called EMS, EMS established PIV, 4 mg Zofran, 324 mg ASA given, NSR, pt at this time denies pain, states she feels better.

## 2015-05-23 ENCOUNTER — Telehealth: Payer: Self-pay | Admitting: *Deleted

## 2015-05-23 ENCOUNTER — Encounter: Payer: Medicare Other | Attending: Cardiovascular Disease

## 2015-05-23 ENCOUNTER — Telehealth: Payer: Self-pay

## 2015-05-23 DIAGNOSIS — J449 Chronic obstructive pulmonary disease, unspecified: Secondary | ICD-10-CM | POA: Insufficient documentation

## 2015-05-23 DIAGNOSIS — I5030 Unspecified diastolic (congestive) heart failure: Secondary | ICD-10-CM | POA: Insufficient documentation

## 2015-05-23 NOTE — Telephone Encounter (Signed)
Pt daughter called, states that pt was in the ER yesterday for burning in her chest, they told her to f/u with her cardiologist. Pt has appt on 8/9/, and she asks if this is ok to wait this long. Please advise.

## 2015-05-23 NOTE — Telephone Encounter (Signed)
Not attending class 05/23/15 due to ED visit on July 30th for chest pain

## 2015-05-27 ENCOUNTER — Encounter: Payer: Medicare Other | Admitting: Respiratory Therapy

## 2015-05-27 DIAGNOSIS — J449 Chronic obstructive pulmonary disease, unspecified: Secondary | ICD-10-CM

## 2015-05-27 DIAGNOSIS — I509 Heart failure, unspecified: Secondary | ICD-10-CM

## 2015-05-27 DIAGNOSIS — I5030 Unspecified diastolic (congestive) heart failure: Secondary | ICD-10-CM | POA: Diagnosis present

## 2015-05-27 NOTE — Progress Notes (Signed)
Daily Session Note  Patient Details  Name: Catherine Holmes MRN: [id number] Date of Birth: 01/28/1928 Referring Provider:  McQuaid, Douglas B, MD  Encounter Date: 05/27/2015  Check In:     Session Check In - 05/27/15 1705    Check-In   Staff Present   RRT, RCP Respiratory Therapist;Carroll Enterkin RN, BSN;Renee MacMillan MS, ACSM CEP Exercise Physiologist   ER physicians immediately available to respond to emergencies [facility] immediately available ER MD   Physician(s) Dr. Williams and Dr. Quigley   Medication changes reported     No   Fall or balance concerns reported    No   Warm-up and Cool-down Performed on first and last piece of equipment   VAD Patient? No   Pain Assessment   Currently in Pain? No/denies         Goals Met:  Proper associated with RPD/PD & O2 Sat Exercise tolerated well  Goals Unmet:  Not Applicable  Goals Comments:    Dr. Mark Miller is Medical Director for [facility] and [facility]. 

## 2015-05-30 ENCOUNTER — Encounter: Payer: Medicare Other | Admitting: *Deleted

## 2015-05-30 DIAGNOSIS — I509 Heart failure, unspecified: Secondary | ICD-10-CM

## 2015-05-30 DIAGNOSIS — J449 Chronic obstructive pulmonary disease, unspecified: Secondary | ICD-10-CM

## 2015-05-30 NOTE — Progress Notes (Signed)
Daily Session Note  Patient Details  Name: Catherine Holmes MRN: [id number] Date of Birth: 31-Jul-1928 Referring Provider:  Juanito Doom, MD  Encounter Date: 05/30/2015  Check In:     Session Check In - 05/30/15 1156    Check-In   Staff Present Laureen Owens Shark BS, RRT, Respiratory Therapist;Karmelo Bass RN, BSN;Kelly Amedeo Plenty BS, ACSM CEP Exercise Physiologist   ER physicians immediately available to respond to emergencies [facility] immediately available ER MD   Physician(s) Dr. Jacqualine Code and Dr. Kerman Passey    Medication changes reported     No   Fall or balance concerns reported    No   Warm-up and Cool-down Performed on first and last piece of equipment   VAD Patient? No   Pain Assessment   Currently in Pain? No/denies         Goals Met:  Proper associated with RPD/PD & O2 Sat  Goals Unmet:  Not Applicable  Goals Comments: Doing well in Pulm Rehab   Dr. Emily Filbert is Medical Director for [place] and [facility].

## 2015-05-30 NOTE — Telephone Encounter (Signed)
Pt is aware that we are working on getting samples. We have not received any samples as of now. Pt will be coming to see MD 05/31/15 to discuss medication cost.

## 2015-05-31 ENCOUNTER — Encounter: Payer: Self-pay | Admitting: Cardiovascular Disease

## 2015-05-31 ENCOUNTER — Ambulatory Visit (INDEPENDENT_AMBULATORY_CARE_PROVIDER_SITE_OTHER): Payer: Medicare Other | Admitting: Cardiovascular Disease

## 2015-05-31 VITALS — BP 102/63 | HR 67 | Ht 61.0 in | Wt 165.0 lb

## 2015-05-31 DIAGNOSIS — I10 Essential (primary) hypertension: Secondary | ICD-10-CM

## 2015-05-31 DIAGNOSIS — I779 Disorder of arteries and arterioles, unspecified: Secondary | ICD-10-CM

## 2015-05-31 DIAGNOSIS — R079 Chest pain, unspecified: Secondary | ICD-10-CM

## 2015-05-31 DIAGNOSIS — I739 Peripheral vascular disease, unspecified: Secondary | ICD-10-CM

## 2015-05-31 DIAGNOSIS — I5033 Acute on chronic diastolic (congestive) heart failure: Secondary | ICD-10-CM | POA: Diagnosis not present

## 2015-05-31 DIAGNOSIS — E119 Type 2 diabetes mellitus without complications: Secondary | ICD-10-CM

## 2015-05-31 DIAGNOSIS — R0602 Shortness of breath: Secondary | ICD-10-CM

## 2015-05-31 MED ORDER — SPIRONOLACTONE 25 MG PO TABS: 25.0000 mg | ORAL_TABLET | Freq: Every day | ORAL | Status: DC

## 2015-05-31 NOTE — Patient Instructions (Addendum)
You are doing well.  If blood pressure runs low, Hold the hydalazine. Ok to hold bystolic and cardura for low blood pressure, or cut in 1/2  Hold the spironolactone on the weekends  Take bumex as needed for ankle swelling, take with 2 potassium pills  Please call us if you have new issues that need to be addressed before your next appt.  Your physician wants you to follow-up in: 6 months.  You will receive a reminder letter in the mail two months in advance. If you don't receive a letter, please call our office to schedule the follow-up appointment.

## 2015-05-31 NOTE — Assessment & Plan Note (Signed)
She will titrate some of her medications, hold hydralazine for low blood pressure, hold or cut the Cardura in half for low blood pressure,

## 2015-05-31 NOTE — Assessment & Plan Note (Signed)
Recommended that she take Bumex at least every other day with potassium 10 mEq She does have ankle edema, dramatically improved from previous visits For elevated potassium, we'll hold her Aldactone on the weekends

## 2015-05-31 NOTE — Assessment & Plan Note (Signed)
Mild chronic shortness of breath likely from obesity, deconditioning, COPD, CHF Stable at this time

## 2015-05-31 NOTE — Assessment & Plan Note (Signed)
Followed by Dr. Wyn Quaker Unable to tolerate statins

## 2015-05-31 NOTE — Assessment & Plan Note (Signed)
We have encouraged careful diet management in an effort to lose weight. She does eat out frequently

## 2015-05-31 NOTE — Progress Notes (Signed)
Patient ID: Catherine Holmes, female    DOB: 06-11-1928, 81 y.o.   MRN: [id number]  HPI Comments: 81 year-old woman with a history of DM,  peripheral vascular disease, 60 to 70% carotid arterial disease, moderate arterial disease of the lower extremities, status post bilateral iliac stenting, moderate renal stenosis, hyperlipidemia, history of severe  hypertension with labile blood pressures, history of episodic shortness of breath and chest squeezing relieved with albuterol/nebs who presents for routine followup. She has a sulfa allergy to loop diuretics. History of falls.   left carotid arterial stenosis estimated at 60-79% disease. She has had several admissions for diastolic CHF, typically from dietary indiscretion and periodic noncompliance with diuretics She was admitted on 11/02/2013 , discharged on 11/16/2013  admitted on 01/24/2014, discharge from 01/27/2014 for acute respiratory failure secondary to COPD History of anemia followed by hematology She presents today for follow-up of her chronic diastolic CHF  In follow-up, she is taking Bumex several days per week, has not taken any this week. She is taking Aldactone every day. Labile blood pressure, sometimes running low. She wears compression hose, does notice swelling around her ankles. Continues to eat out at restaurants frequently. Otherwise feels well. Many medications for knot on her list today including hydralazine, Cardura, both of which she takes Sometimes she gets the hydralazine in half, and the hydralazine for low blood pressure  EKG on today's visit shows normal sinus rhythm with rate 67 bpm, no significant ST or T-wave changes  Other past medical history Recent hospital admission for acute on chronic diastolic CHF. Improvement of her symptoms with aggressive diuresis. Readmitted this past week to the hospital with hypotension. She has persistent anemia  had iron infusions in the past.   Followed by Dr. Cherylann Ratel for her  kidney issues  She stopped her amiodarone out of concern for possible pulmonary complications. Denies having any palpitations She does report having rare episodes of near syncope wall in a sitting position. Comes across her like a wave it resolves relatively quickly  She was in the hospital 05/05/2014 with hypertensive urgency, hyponatremia. Blood pressure is 201/98 and she was put back on her outpatient regiment with improvement of her symptoms. She suffered a fractured hip on 05/07/2014 and we were consulted for preop evaluation. Found to have elevated at her pressures at 65. She was discharged on 05/17/2014 EKGs from her hospital course showed she had atrial fibrillation on 05/09/2014  Previous Echocardiogram in the hospital showed normal ejection fraction, moderate to severe, hypertension, moderately dilated left atrium  Has a history of large hiatal hernia She is not able to tolerate statins. She does handle zetia without significant side effects. previously encouraged to take red yeast rice.   Previous weight October 2014 was 148 pounds.  weight at the end of December 2014 was 160 pounds, she had increasing shortness of breath, had not been taking Bumex    in the hospital at [facility] in 2012 for general malaise, severe hypertension, shortness of breath, chest pain. She had a stress test, lexiscan that showed no significant ischemia. Significant shortness of breath with lexiscan.   hospital admission on 01/08/2012 for shortness of breath and syncope. She was found to have significant anemia. Treated with Lasix for diastolic CHF.  Underlying severe left carotid arterial disease estimated at 70%.   Prior followup with Dr. Welton Flakes in pulmonary with numerous CT scans, MRIs, PFTs, sleep study. She was told that she had hypoxemia overnight and was started on nighttime oxygen.  hospitalization 05/27/2013 for acute on chronic diastolic CHF. She was treated with IV Bumex twice a day with improvement of  her symptoms. Getting factor was her anemia with hematocrit of 25.  Previous iron infusion. In the second week of September 2014 she fell outside a cracker barrel and hit her head. She has been slow to recover. She still has a knot on her head.  Echocardiogram in the hospital August 2014 shows normal ejection fraction, severely elevated right ventricular systolic pressures, mild to moderate TR  Outpatient Encounter Prescriptions as of 05/31/2015  Medication Sig  . albuterol (PROAIR HFA) 108 (90 BASE) MCG/ACT inhaler Inhale 2 puffs into the lungs every 6 (six) hours as needed.  Marland Kitchen albuterol (PROVENTIL) (2.5 MG/3ML) 0.083% nebulizer solution Take 2.5 mg by nebulization every 6 (six) hours as needed for wheezing or shortness of breath.   Marland Kitchen arformoterol (BROVANA) 15 MCG/2ML NEBU Take 2 mLs (15 mcg total) by nebulization 2 (two) times daily.  Marland Kitchen aspirin EC 81 MG tablet Take 81 mg by mouth daily.  Marland Kitchen BETA CAROTENE PO Take by mouth 2 (two) times daily.  . beta carotene w/minerals (OCUVITE) tablet Take 1 tablet by mouth 2 (two) times daily.  . bumetanide (BUMEX) 1 MG tablet Take 1 tablet (1 mg total) by mouth daily. Take 1 tab daily (Patient taking differently: Take 1 mg by mouth as needed. Take 1 tab daily)  . calcium carbonate (OS-CAL) 600 MG TABS tablet Take 600 mg by mouth daily with breakfast.  . Cholecalciferol (VITAMIN D-3 PO) Take 1,000 Units by mouth.  . citalopram (CELEXA) 20 MG tablet Take 20 mg by mouth daily.  . diphenhydramine-acetaminophen (TYLENOL PM) 25-500 MG TABS Take 1 tablet by mouth at bedtime as needed.  . fluticasone (FLONASE) 50 MCG/ACT nasal spray Place 1 spray into both nostrils daily.  Marland Kitchen gabapentin (NEURONTIN) 100 MG capsule Take 100 mg by mouth 2 (two) times daily.   . hydrALAZINE (APRESOLINE) 50 MG tablet Take 50 mg by mouth as needed.   Marland Kitchen HYDROcodone-acetaminophen (NORCO/VICODIN) 5-325 MG per tablet Take 1 tablet by mouth as needed for severe pain.   . mometasone-formoterol  (DULERA) 100-5 MCG/ACT AERO Inhale 2 puffs into the lungs 2 (two) times daily.   . Multiple Vitamin (MULTIVITAMIN) tablet Take 1 tablet by mouth daily.    . nebivolol (BYSTOLIC) 5 MG tablet Take 1 tablet (5 mg total) by mouth daily.  . nitroGLYCERIN (NITROSTAT) 0.4 MG SL tablet Place 1 tablet (0.4 mg total) under the tongue every 5 (five) minutes as needed for chest pain.  . potassium chloride (K-DUR,KLOR-CON) 10 MEQ tablet Take 5 mEq by mouth 2 (two) times daily as needed. Patient takes this when she takes Bumex.  Marland Kitchen tiotropium (SPIRIVA) 18 MCG inhalation capsule Place 1 capsule (18 mcg total) into inhaler and inhale daily.  Marland Kitchen triamcinolone (KENALOG) 0.025 % cream Apply 1 application topically daily as needed (for arms).  . trimethoprim-polymyxin b (POLYTRIM) ophthalmic solution Place 1 drop into both eyes 2 (two) times daily.  Marland Kitchen doxazosin (CARDURA) 4 MG tablet Take 1 tablet (4 mg total) by mouth daily.  Marland Kitchen ezetimibe (ZETIA) 10 MG tablet Take 5 mg by mouth daily.  Marland Kitchen spironolactone (ALDACTONE) 25 MG tablet Take 1 tablet (25 mg total) by mouth daily.  . [DISCONTINUED] erythromycin ophthalmic ointment Place 1 application into both eyes at bedtime.  . [DISCONTINUED] ezetimibe (ZETIA) 10 MG tablet Take 1 tablet (10 mg total) by mouth daily. (Patient not taking: Reported on 05/31/2015)  . [  DISCONTINUED] predniSONE (STERAPRED UNI-PAK 21 TAB) 10 MG (21) TBPK tablet Take 1 tablet (10 mg total) by mouth daily. 6 tabs PO x 1 day 5 tabs PO x 1 day 4 tabs PO x 1 day 3 tabs PO x 1 day 2 tabs PO x 1 day 1 tab PO x 1 day and stop (Patient not taking: Reported on 05/31/2015)   No facility-administered encounter medications on file as of 05/31/2015.   Social hx History   Social History  . Marital Status: Divorced    Spouse Name: N/A  . Number of Children: 2  . Years of Education: N/A   Occupational History  . Retired     former IT sales professional   Social History Main Topics  . Smoking status: Former Smoker  -- 0.50 packs/day for 30 years    Types: Cigarettes    Quit date: 10/22/1994  . Smokeless tobacco: Never Used  . Alcohol Use: Yes  . Drug Use: No  . Sexual Activity: Not on file   Other Topics Concern  . Not on file   Social History Narrative    Review of Systems  Constitutional: Positive for fatigue.  Respiratory: Negative.   Cardiovascular: Negative.   Gastrointestinal: Negative.   Musculoskeletal: Positive for arthralgias and gait problem.  Neurological: Positive for weakness.  Hematological: Negative.   Psychiatric/Behavioral: Negative.   All other systems reviewed and are negative.   BP 102/63 mmHg  Pulse 67  Ht 5\' 1"  (1.549 m)  Wt 165 lb (74.844 kg)  BMI 31.19 kg/m2  Physical Exam  Constitutional: She is oriented to person, place, and time. She appears well-developed and well-nourished.  Appears pale  HENT:  Head: Normocephalic.  Nose: Nose normal.  Mouth/Throat: Oropharynx is clear and moist.  Eyes: Conjunctivae are normal. Pupils are equal, round, and reactive to light.  Neck: Normal range of motion. Neck supple. No JVD present.  Cardiovascular: Normal rate, regular rhythm, S1 normal, S2 normal, normal heart sounds and intact distal pulses.  Exam reveals no gallop and no friction rub.   No murmur heard. Trace edema around her ankles, compression hose in place  Pulmonary/Chest: Effort normal and breath sounds normal. No respiratory distress. She has no wheezes. She has no rales. She exhibits no tenderness.  Abdominal: Soft. Bowel sounds are normal. She exhibits no distension. There is no tenderness.  Musculoskeletal: Normal range of motion. She exhibits no edema or tenderness.  Lymphadenopathy:    She has no cervical adenopathy.  Neurological: She is alert and oriented to person, place, and time. Coordination normal.  Skin: Skin is warm and dry. No rash noted. No erythema.  Psychiatric: She has a normal mood and affect. Her behavior is normal. Judgment and  thought content normal.    Assessment and Plan  Nursing note and vitals reviewed.

## 2015-06-03 ENCOUNTER — Encounter: Payer: Medicare Other | Admitting: *Deleted

## 2015-06-03 DIAGNOSIS — I509 Heart failure, unspecified: Secondary | ICD-10-CM

## 2015-06-03 DIAGNOSIS — J449 Chronic obstructive pulmonary disease, unspecified: Secondary | ICD-10-CM

## 2015-06-03 NOTE — Progress Notes (Signed)
Daily Session Note  Patient Details  Name: SHONDREA STEINERT MRN: [id number] Date of Birth: 16-Aug-1928 Referring Provider:  Dion Body, MD  Encounter Date: 06/03/2015  Check In:     Session Check In - 06/03/15 1148    Check-In   Staff Present Candiss Norse MS, ACSM CEP Exercise Physiologist;Stacey Blanch Media RRT, RCP Respiratory Therapist;Carroll Enterkin RN, BSN   ER physicians immediately available to respond to emergencies [facility] immediately available ER MD   Physician(s) Jacqualine Code and Kinner   Medication changes reported     No   Fall or balance concerns reported    No   Warm-up and Cool-down Performed on first and last piece of equipment   VAD Patient? No   Pain Assessment   Currently in Pain? No/denies   Multiple Pain Sites No         Goals Met:  Proper associated with RPD/PD & O2 Sat Independence with exercise equipment Using PLB without cueing & demonstrates good technique Exercise tolerated well Personal goals reviewed Strength training completed today  Goals Unmet:  Not Applicable  Goals Comments:Ms. Badgett was given a spacer today and given instructions on how to use it correctly.  Dr. Emily Filbert is Medical Director for [facility] and [facility].

## 2015-06-06 ENCOUNTER — Encounter: Payer: Medicare Other | Admitting: *Deleted

## 2015-06-06 DIAGNOSIS — J4489 Other specified chronic obstructive pulmonary disease: Secondary | ICD-10-CM

## 2015-06-06 DIAGNOSIS — I509 Heart failure, unspecified: Secondary | ICD-10-CM

## 2015-06-06 DIAGNOSIS — J449 Chronic obstructive pulmonary disease, unspecified: Secondary | ICD-10-CM

## 2015-06-06 NOTE — Progress Notes (Signed)
Pulmonary Individual Treatment Plan  Patient Details  Name: Catherine Holmes MRN: [id number] Date of Birth: July 18, 1928 Referring Provider:  Lupita Leash, MD  Initial Encounter Date:    Visit Diagnosis: COPD (chronic obstructive pulmonary disease) with chronic bronchitis  Congestive heart failure, unspecified congestive heart failure chronicity, unspecified congestive heart failure type  Patient's Home Medications on Admission:  Current outpatient prescriptions:    albuterol (PROAIR HFA) 108 (90 BASE) MCG/ACT inhaler, Inhale 2 puffs into the lungs every 6 (six) hours as needed., Disp: 3 Inhaler, Rfl: 0   albuterol (PROVENTIL) (2.5 MG/3ML) 0.083% nebulizer solution, Take 2.5 mg by nebulization every 6 (six) hours as needed for wheezing or shortness of breath. , Disp: , Rfl:    arformoterol (BROVANA) 15 MCG/2ML NEBU, Take 2 mLs (15 mcg total) by nebulization 2 (two) times daily., Disp: 120 mL, Rfl: 4   aspirin EC 81 MG tablet, Take 81 mg by mouth daily., Disp: , Rfl:    BETA CAROTENE PO, Take by mouth 2 (two) times daily., Disp: , Rfl:    beta carotene w/minerals (OCUVITE) tablet, Take 1 tablet by mouth 2 (two) times daily., Disp: , Rfl:    bumetanide (BUMEX) 1 MG tablet, Take 1 tablet (1 mg total) by mouth daily. Take 1 tab daily (Patient taking differently: Take 1 mg by mouth as needed. Take 1 tab daily), Disp: 180 tablet, Rfl: 3   calcium carbonate (OS-CAL) 600 MG TABS tablet, Take 600 mg by mouth daily with breakfast., Disp: , Rfl:    Cholecalciferol (VITAMIN D-3 PO), Take 1,000 Units by mouth., Disp: , Rfl:    citalopram (CELEXA) 20 MG tablet, Take 20 mg by mouth daily., Disp: , Rfl:    diphenhydramine-acetaminophen (TYLENOL PM) 25-500 MG TABS, Take 1 tablet by mouth at bedtime as needed., Disp: , Rfl:    doxazosin (CARDURA) 4 MG tablet, Take 1 tablet (4 mg total) by mouth daily., Disp: , Rfl:    ezetimibe (ZETIA) 10 MG tablet, Take 5 mg by mouth daily., Disp: , Rfl:     fluticasone (FLONASE) 50 MCG/ACT nasal spray, Place 1 spray into both nostrils daily., Disp: , Rfl:    gabapentin (NEURONTIN) 100 MG capsule, Take 100 mg by mouth 2 (two) times daily. , Disp: , Rfl:    hydrALAZINE (APRESOLINE) 50 MG tablet, Take 50 mg by mouth as needed. , Disp: , Rfl:    HYDROcodone-acetaminophen (NORCO/VICODIN) 5-325 MG per tablet, Take 1 tablet by mouth as needed for severe pain. , Disp: , Rfl:    mometasone-formoterol (DULERA) 100-5 MCG/ACT AERO, Inhale 2 puffs into the lungs 2 (two) times daily. , Disp: , Rfl:    Multiple Vitamin (MULTIVITAMIN) tablet, Take 1 tablet by mouth daily.  , Disp: , Rfl:    nebivolol (BYSTOLIC) 5 MG tablet, Take 1 tablet (5 mg total) by mouth daily., Disp: 30 tablet, Rfl: 0   nitroGLYCERIN (NITROSTAT) 0.4 MG SL tablet, Place 1 tablet (0.4 mg total) under the tongue every 5 (five) minutes as needed for chest pain., Disp: 25 tablet, Rfl: 6   potassium chloride (K-DUR,KLOR-CON) 10 MEQ tablet, Take 5 mEq by mouth 2 (two) times daily as needed. Patient takes this when she takes Bumex., Disp: , Rfl:    spironolactone (ALDACTONE) 25 MG tablet, Take 1 tablet (25 mg total) by mouth daily., Disp: 90 tablet, Rfl: 3   tiotropium (SPIRIVA) 18 MCG inhalation capsule, Place 1 capsule (18 mcg total) into inhaler and inhale daily., Disp: 90 capsule,  Rfl: 0   triamcinolone (KENALOG) 0.025 % cream, Apply 1 application topically daily as needed (for arms)., Disp: , Rfl:    trimethoprim-polymyxin b (POLYTRIM) ophthalmic solution, Place 1 drop into both eyes 2 (two) times daily., Disp: , Rfl:   Past Medical History: Past Medical History  Diagnosis Date   Essential hypertension    Diabetes mellitus type II, controlled    COPD (chronic obstructive pulmonary disease)    Osteoporosis    Carotid arterial disease     a. 2012 Carotid dopplers: 40-59% R ICA, 60-79% L ICA   [place]    Arthralgia    Sjoegren syndrome    TIA (transient ischemic  attack)    Emphysema    MVP (mitral valve prolapse)    Chronic diastolic CHF (congestive heart failure)     a. 08/2011 Echo: EF 65-70%, mild LVH, mod dil LA, mildly dil RA, mild MR, mild-mod AoV Sclerosis w/o stenosis, mod-sev elev PA pressures, mild TR.   Acid reflux    Diverticulitis    Anemia    History of pneumonia    Syncope and collapse    Anemia    Hip fracture, left    Hyperlipidemia    Hiatal hernia    Chest pain     a. 08/2011 MV: EF 74%, no ischemia.   Noncompliance     a. h/o not taking bumex as Rx.   PAF (paroxysmal atrial fibrillation)     a. CHA2DS2VASc = 8 ->No OAC 2/2 falls.    Tobacco Use: History  Smoking status   Former Smoker -- 0.50 packs/day for 30 years   Types: Cigarettes   Quit date: 10/22/1994  Smokeless tobacco   Never Used    Labs: Recent Review Flowsheet Data    There is no flowsheet data to display.         POCT Glucose      04/27/15 1130 05/04/15 1125         POCT Blood Glucose   Pre-Exercise 115 mg/dL       Post-Exercise 161 mg/dL       Pre-Exercise #2  99 mg/dL      Post-Exercise #2  109 mg/dL      Pre-Exercise #3  096 mg/dL      Post-Exercise #3  102 mg/dL         ADL UCSD:     ADL UCSD      04/19/15 1305       ADL UCSD   ADL Phase Entry     SOB Score total 92     Rest 1     Walk 4     Stairs 5     Bath 4     Dress 5     Shop 5         Pulmonary Function Assessment:     Pulmonary Function Assessment - 04/19/15 1130    Pulmonary Function Tests   FVC% 36 %   FEV1% 35 %   FEV1/FVC Ratio 71.7   Breath   Bilateral Breath Sounds Clear;Decreased   Shortness of Breath Yes      Exercise Target Goals:    Exercise Program Goal: Individual exercise prescription set with THRR, safety & activity barriers. Participant demonstrates ability to understand and report RPE using BORG scale, to self-measure pulse accurately, and to acknowledge the importance of the exercise  prescription.  Exercise Prescription Goal: Starting with aerobic activity 30 plus minutes a day, 3 days per week  for initial exercise prescription. Provide home exercise prescription and guidelines that participant acknowledges understanding prior to discharge.  Activity Barriers & Risk Stratification:     Activity Barriers & Risk Stratification - 04/19/15 1130    Activity Barriers & Risk Stratification   Activity Barriers Shortness of Breath;Balance Concerns;Deconditioning;Assistive Device   Risk Stratification Moderate      6 Minute Walk:     6 Minute Walk      04/19/15 1420       6 Minute Walk   Phase Initial     Distance 180 feet     Walk Time 3 minutes     Resting HR 67 bpm     Resting BP 122/70 mmHg     Max Ex. HR 82 bpm     Max Ex. BP 132/74 mmHg     RPE 15     Perceived Dyspnea  4     Symptoms No        Initial Exercise Prescription:     Initial Exercise Prescription - 04/19/15 1500    Date of Initial Exercise Prescription   Date 04/19/15   Treadmill   MPH 1   Grade 0   Minutes 10   Recumbant Bike   Level 1   RPM 40   Watts 20   Minutes 10   NuStep   Level 2   Watts 40   Minutes 10   Arm Ergometer   Level 1   Watts 10   Minutes 10   REL-XR   Level 1   Watts 40   Minutes 10   Prescription Details   Frequency (times per week) 3   Duration Progress to 30 minutes of continuous aerobic without signs/symptoms of physical distress   Intensity   THRR REST +  30   Ratings of Perceived Exertion 11-15   Perceived Dyspnea 2-4   Progression Continue progressive overload as per policy without signs/symptoms or physical distress.   Resistance Training   Training Prescription Yes   Weight 1   Reps 10-12      Exercise Prescription Changes:     Exercise Prescription Changes      04/27/15 1500 04/29/15 1200 05/04/15 1500 05/09/15 1500 06/06/15 1500   Exercise Review   Progression   Yes Yes Yes   Response to Exercise   Blood Pressure (Admit)  118/70 mmHg 118/70 mmHg 120/78 mmHg 120/78 mmHg 100/60 mmHg   Blood Pressure (Exercise) 130/70 mmHg 130/70 mmHg 130/68 mmHg 130/68 mmHg 144/74 mmHg   Blood Pressure (Exit) 120/62 mmHg 120/62 mmHg 110/66 mmHg 110/66 mmHg 122/72 mmHg   Heart Rate (Admit) 68 bpm 68 bpm 65 bpm 65 bpm 71 bpm   Heart Rate (Exercise) 105 bpm 105 bpm 78 bpm 78 bpm 70 bpm   Heart Rate (Exit) 90 bpm 90 bpm 63 bpm 63 bpm 90 bpm   Oxygen Saturation (Admit) 92 % 92 % 93 %  2l/m [place] 93 %  2l/m [place] 96 %  2l/m [place]   Oxygen Saturation (Exercise) 90 % 90 % 88 % 88 % 93 %  2l/m   Oxygen Saturation (Exit) 95 % 95 % 96 % 96 % 91 %   Rating of Perceived Exertion (Exercise) 13 13 15 15 14    Perceived Dyspnea (Exercise) 3 3 4 4 4    Resistance Training   Training Prescription Yes Yes Yes Yes Yes   Weight 1 1 1 1 1    Treadmill   MPH 0.8 0.8 1 1  1   Grade 0 0 0 0 0   Minutes Recumbant Bike   Level BioStep   RPM 24 24 40 40 30   Minutes NuStep   Level Watts 20 20 40 40 40   Minutes Recumbant Elliptical   Level  2  BIOSTEP      Watts  30      Minutes  10         Discharge Exercise Prescription (Final Exercise Prescription Changes):     Exercise Prescription Changes - 06/06/15 1500    Exercise Review   Progression Yes   Response to Exercise   Blood Pressure (Admit) 100/60 mmHg   Blood Pressure (Exercise) 144/74 mmHg   Blood Pressure (Exit) 122/72 mmHg   Heart Rate (Admit) 71 bpm   Heart Rate (Exercise) 70 bpm   Heart Rate (Exit) 90 bpm   Oxygen Saturation (Admit) 96 %  2l/m [place]   Oxygen Saturation (Exercise) 93 %  2l/m   Oxygen Saturation (Exit) 91 %   Rating of Perceived Exertion (Exercise) 14   Perceived Dyspnea (Exercise) 4   Resistance Training   Training Prescription Yes   Weight 1   Treadmill   MPH 1   Grade 0   Minutes 10   Recumbant Bike   Level 2  BioStep   RPM 30   Minutes 20   NuStep   Level 2   Watts 40    Minutes 20       Nutrition:  Target Goals: Understanding of nutrition guidelines, daily intake of sodium 1500mg , cholesterol 200mg , calories 30% from fat and 7% or less from saturated fats, daily to have 5 or more servings of fruits and vegetables.  Biometrics:     Pre Biometrics - 04/19/15 1424    Pre Biometrics   Height 5' (1.524 m)   Weight 165 lb (74.844 kg)   Waist Circumference 41 inches   Hip Circumference 45 inches   Waist to Hip Ratio 0.91 %   BMI (Calculated) 32.3       Nutrition Therapy Plan and Nutrition Goals:     Nutrition Therapy & Goals - 04/19/15 1312    Nutrition Therapy   Diet Ms Fonseca would like to meet with the dietitian. her goal is to lose 15lbs.      Nutrition Discharge: Rate Your Plate Scores:   Psychosocial: Target Goals: Acknowledge presence or absence of depression, maximize coping skills, provide positive support system. Participant is able to verbalize types and ability to use techniques and skills needed for reducing stress and depression.  Initial Review & Psychosocial Screening:     Initial Psych Review & Screening - 04/19/15 1130    Barriers   Psychosocial barriers to participate in program There are no identifiable barriers or psychosocial needs.  Ms Bellville has good family support from her daughter who lives with Ms Cantrall. Ms Afonso just lost her son in December 2015 and indicated depression over this loss.   Screening Interventions   Interventions Program counselor consult;Encouraged to exercise      Quality of Life Scores:   PHQ-9:     Recent Review Flowsheet Data    There is no flowsheet data to display.      Psychosocial Evaluation and Intervention:   Psychosocial Re-Evaluation:  Education:  Education Goals: Education classes will be provided on a weekly basis, covering required topics. Participant will state understanding/return demonstration of topics presented.  Learning Barriers/Preferences:      Learning Barriers/Preferences - 04/19/15 1130    Learning Barriers/Preferences   Learning Barriers None   Learning Preferences Group Instruction;Individual Instruction;Pictoral;Skilled Demonstration;Written Material;Video;Verbal Instruction      Education Topics: Initial Evaluation Education: - Verbal, written and demonstration of respiratory meds, RPE/PD scales, oximetry and breathing techniques. Instruction on use of nebulizers and MDIs: cleaning and proper use, rinsing mouth with steroid doses and importance of monitoring MDI activations.          Pulmonary Rehab from 06/06/2015 in [facility]   Date  04/19/15   Educator  LB   Instruction Review Code  2- meets goals/outcomes      General Nutrition Guidelines/Fats and Fiber: -Group instruction provided by verbal, written material, models and posters to present the general guidelines for heart healthy nutrition. Gives an explanation and review of dietary fats and fiber.      Pulmonary Rehab from 06/06/2015 in [facility]   Date  06/06/15   Educator  C. Perlie Gold, RD   Instruction Review Code  2- meets goals/outcomes      Controlling Sodium/Reading Food Labels: -Group verbal and written material supporting the discussion of sodium use in heart healthy nutrition. Review and explanation with models, verbal and written materials for utilization of the food label.   Exercise Physiology & Risk Factors: - Group verbal and written instruction with models to review the exercise physiology of the cardiovascular system and associated critical values. Details cardiovascular disease risk factors and the goals associated with each risk factor.   Aerobic Exercise & Resistance Training: - Gives group verbal and written discussion on the health impact of inactivity. On the components of aerobic and resistive training programs and the benefits of this training and how to  safely progress through these programs.      Pulmonary Rehab from 06/06/2015 in [facility]   Date  04/27/15   Educator  SW   Instruction Review Code  2- meets goals/outcomes      Flexibility, Balance, General Exercise Guidelines: - Provides group verbal and written instruction on the benefits of flexibility and balance training programs. Provides general exercise guidelines with specific guidelines to those with heart or lung disease. Demonstration and skill practice provided.   Stress Management: - Provides group verbal and written instruction about the health risks of elevated stress, cause of high stress, and healthy ways to reduce stress.   Depression: - Provides group verbal and written instruction on the correlation between heart/lung disease and depressed mood, treatment options, and the stigmas associated with seeking treatment.   Exercise & Equipment Safety: - Individual verbal instruction and demonstration of equipment use and safety with use of the equipment.      Pulmonary Rehab from 06/06/2015 in [facility]   Date  04/27/15   Educator  RM   Instruction Review Code  2- meets goals/outcomes      Infection Prevention: - Provides verbal and written material to individual with discussion of infection control including proper hand washing and proper equipment cleaning during exercise session.      Pulmonary Rehab from 06/06/2015 in [facility]   Date  04/27/15   Educator  LB   Instruction Review Code  2- meets goals/outcomes  Falls Prevention: - Provides verbal and written material to individual with discussion of falls prevention and safety.      Pulmonary Rehab from 06/06/2015 in [facility]   Date  04/19/15   Educator  LB   Instruction Review Code  2- meets goals/outcomes      Diabetes: - Individual verbal and  written instruction to review signs/symptoms of diabetes, desired ranges of glucose level fasting, after meals and with exercise. Advice that pre and post exercise glucose checks will be done for 3 sessions at entry of program.      Pulmonary Rehab from 06/06/2015 in [facility]   Date  04/27/15   Educator  LB   Instruction Review Code  2- meets goals/outcomes      Chronic Lung Diseases: - Group verbal and written instruction to review new updates, new respiratory medications, new advancements in procedures and treatments. Provide informative websites and "800" numbers of self-education.      Pulmonary Rehab from 06/06/2015 in [facility]   Date  05/30/15   Educator  Hulen Luster, RRT   Instruction Review Code  2- meets goals/outcomes      Lung Procedures: - Group verbal and written instruction to describe testing methods done to diagnose lung disease. Review the outcome of test results. Describe the treatment choices: Pulmonary Function Tests, ABGs and oximetry.      Pulmonary Rehab from 06/06/2015 in [facility]   Date  05/27/15   Educator  sj   Instruction Review Code  2- meets goals/outcomes      Energy Conservation: - Provide group verbal and written instruction for methods to conserve energy, plan and organize activities. Instruct on pacing techniques, use of adaptive equipment and posture/positioning to relieve shortness of breath.   Triggers: - Group verbal and written instruction to review types of environmental controls: home humidity, furnaces, filters, dust mite/pet prevention, HEPA vacuums. To discuss weather changes, air quality and the benefits of nasal washing.   Exacerbations: - Group verbal and written instruction to provide: warning signs, infection symptoms, calling MD promptly, preventive modes, and value of vaccinations. Review: effective airway  clearance, coughing and/or vibration techniques. Create an Sport and exercise psychologist.   Oxygen: - Individual and group verbal and written instruction on oxygen therapy. Includes supplement oxygen, available portable oxygen systems, continuous and intermittent flow rates, oxygen safety, concentrators, and Medicare reimbursement for oxygen.      Pulmonary Rehab from 06/06/2015 in [place] [facility]   Date  04/19/15   Educator  LB   Instruction Review Code  2- meets goals/outcomes      Respiratory Medications: - Group verbal and written instruction to review medications for lung disease. Drug class, frequency, complications, importance of spacers, rinsing mouth after steroid MDI's, and proper cleaning methods for nebulizers.      Pulmonary Rehab from 06/06/2015 in [facility]   Date  04/19/15   Educator  LB   Instruction Review Code  2- meets goals/outcomes      AED/CPR: - Group verbal and written instruction with the use of models to demonstrate the basic use of the AED with the basic ABC's of resuscitation.      Pulmonary Rehab from 06/06/2015 in [facility]   Date  06/03/15   Educator  CE   Instruction Review Code  2- meets goals/outcomes  Breathing Retraining: - Provides individuals verbal and written instruction on purpose, frequency, and proper technique of diaphragmatic breathing and pursed-lipped breathing. Applies individual practice skills.      Pulmonary Rehab from 06/06/2015 in [facility]   Date  04/27/15   Educator  LB   Instruction Review Code  2- meets goals/outcomes      Anatomy and Physiology of the Lungs: - Group verbal and written instruction with the use of models to provide basic lung anatomy and physiology related to function, structure and complications of lung disease.   Heart Failure: - Group verbal and written instruction on  the basics of heart failure: signs/symptoms, treatments, explanation of ejection fraction, enlarged heart and cardiomyopathy.   Sleep Apnea: - Individual verbal and written instruction to review Obstructive Sleep Apnea. Review of risk factors, methods for diagnosing and types of masks and machines for OSA.   Anxiety: - Provides group, verbal and written instruction on the correlation between heart/lung disease and anxiety, treatment options, and management of anxiety.   Relaxation: - Provides group, verbal and written instruction about the benefits of relaxation for patients with heart/lung disease. Also provides patients with examples of relaxation techniques.   Knowledge Questionnaire Score:     Knowledge Questionnaire Score - 04/19/15 1130    Knowledge Questionnaire Score   Pre Score -3      Personal Goals and Risk Factors at Admission:     Personal Goals and Risk Factors at Admission - 04/19/15 1130    Personal Goals and Risk Factors on Admission    Weight Management Yes   Intervention Learn and follow the exercise and diet guidelines while in the program. Utilize the nutrition and education classes to help gain knowledge of the diet and exercise expectations in the program  Ms Parkin would like to meet with the dietitian.  Her goal is to lose 15lbs. She lives with her daughter and they do eat alot of fruits and vegetables; no salt.   Admit Weight 165 lb (74.844 kg)   Goal Weight 150 lb (68.04 kg)   Increase Aerobic Exercise and Physical Activity Yes   Intervention While in program, learn and follow the exercise prescription taught. Start at a low level workload and increase workload after able to maintain previous level for 30 minutes. Increase time before increasing intensity.  Ms Pankow does have a pedal machine. She would like to strength herself where she is not so dependent on her cane.   Understand more about Heart/Pulmonary Disease. Yes   Intervention While in  program utilize professionals for any questions, and attend the education sessions. Great websites to use are www.americanheart.org or www.lung.org for reliable information.  Ms Engelbert has COPD and CHF and is interested in learning more to manage her diseases.   Improve shortness of breath with ADL's Yes   Intervention While in program, learn and follow the exercise prescription taught. Start at a low level workload and increase workload ad advised by the exercise physiologist. Increase time before increasing intensity.  Ms Steck scored a 65 on the UCSD SOB Questionnaire. She would like to do more activity without shortness of breath.   Develop more efficient breathing techniques such as purse lipped breathing and diaphragmatic breathing; and practicing self-pacing with activity Yes   Intervention While in program, learn and utilize the specific breathing techniques taught to you. Continue to practice and use the techniques as needed.   Increase knowledge of respiratory medications and ability to use  respiratory devices properly.  Yes   Intervention While in program learn and demonstrate appropriate use of your oxygen therapy by increasing flow with exertion, manage oxygen tank operation, including continuous and intermittent flow.  Understanding oxygen is a drug ordered by your physician.;While in program, learn to administer MDI, nebulizer, and spacer properly.;Learn to take respiratory medicine as ordered.;While in program, learn to Clean MDI, nebulizers, and spacers properly.  Ms Whichard has a good understanding of her Annie Sable, ProAir, Spacer, and Albuterol  nebulizer.  She uses a gas portable O2 tank, 2l/m [employer], and her service is from [facility].   Diabetes Yes   Hypertension Yes   Lipids Yes      Personal Goals and Risk Factors Review:      Goals and Risk Factor Review      05/30/15 1130 06/03/15 1610 06/06/15 1130       Weight Management   Goals Progress/Improvement seen    Yes     Comments   Ms Wilmouth has lose 5lbs since her start date in [facility]. She would like to meet with the dietitian.     Increase Aerobic Exercise and Physical Activity   Goals Progress/Improvement seen  Yes       Comments Ms Both worked hard today to meet her exercise goals on the NS and BioStep - total exercise time ; plus she stayed for weight training; she states the importance for her and her health to be active.        Understand more about Heart/Pulmonary Disease   Goals Progress/Improvement seen    Yes     Comments   Ms Freyre enjoys the education and states she has learned more new information for disease management.     Improve shortness of breath with ADL's   Goals Progress/Improvement seen    Yes     Comments   Ms Youtsey states she has noticed a difference in her shortness of breath with activites at home, especially her morning routine.     Breathing Techniques   Goals Progress/Improvement seen  Yes       Comments Ms Hennington uses PLB with her exercise goals and finds it very helpful.       Increase knowledge of respiratory medications   Goals Progress/Improvement seen   Yes      Comments  Ms. Cannata was given a spacer and shown how to use it with her MDI's.  She seemed very impressed with the whistle feature on the spacer and said that it would help her to take her medication correctly..         Personal Goals Discharge:    Comments: 30 day review

## 2015-06-06 NOTE — Progress Notes (Signed)
Daily Session Note  Patient Details  Name: Catherine Holmes MRN: [id number] Date of Birth: 1928-03-02 Referring Provider:  Juanito Doom, MD  Encounter Date: 06/06/2015  Check In:     Session Check In - 06/06/15 1205    Check-In   Staff Present Carson Myrtle BS, RRT, Respiratory Therapist;Hooria Gasparini RN, BSN;Kelly Hayes BS, ACSM CEP Exercise Physiologist   ER physicians immediately available to respond to emergencies [facility] immediately available ER MD   Physician(s) Dr. Karma Greaser and Dr. Jimmye Norman   Medication changes reported     No   Fall or balance concerns reported    No   Warm-up and Cool-down Performed on first and last piece of equipment   VAD Patient? No   Pain Assessment   Currently in Pain? Yes   Pain Location Back   Pain Onset More than a month ago   Pain Frequency Intermittent         Goals Met:  Proper associated with RPD/PD & O2 Sat Exercise tolerated well  Goals Unmet:  Not Applicable  Goals Comments:    Dr. Emily Filbert is Medical Director for [place] and [facility].

## 2015-06-08 ENCOUNTER — Other Ambulatory Visit: Payer: Self-pay | Admitting: *Deleted

## 2015-06-08 DIAGNOSIS — J449 Chronic obstructive pulmonary disease, unspecified: Secondary | ICD-10-CM | POA: Diagnosis not present

## 2015-06-08 DIAGNOSIS — I509 Heart failure, unspecified: Secondary | ICD-10-CM

## 2015-06-08 NOTE — Progress Notes (Signed)
Daily Session Note  Patient Details  Name: Catherine Holmes MRN: [id number] Date of Birth: Jun 04, 1928 Referring Provider:  Juanito Doom, MD  Encounter Date: 06/08/2015  Check In:     Session Check In - 06/08/15 1247    Check-In   Staff Present Lestine Box BS, ACSM EP-C, Exercise Physiologist;Laureen Janell Quiet, RRT, Respiratory Therapist   ER physicians immediately available to respond to emergencies [facility] immediately available ER MD   Physician(s) gayle and schaevit   Medication changes reported     No   Fall or balance concerns reported    No   Warm-up and Cool-down Performed on first and last piece of equipment   VAD Patient? No   Pain Assessment   Currently in Pain? No/denies         Goals Met:  Proper associated with RPD/PD & O2 Sat Exercise tolerated well No report of cardiac concerns or symptoms Strength training completed today  Goals Unmet:  Not Applicable  Goals Comments: [name] had a good day of exercise and maintained acceptable O2sats with exercise on room air.  Dr. Emily Filbert is Medical Director for [place] and [facility].

## 2015-06-09 ENCOUNTER — Other Ambulatory Visit: Payer: Self-pay | Admitting: *Deleted

## 2015-06-09 MED ORDER — POTASSIUM CHLORIDE CRYS ER 10 MEQ PO TBCR: 5.0000 meq | EXTENDED_RELEASE_TABLET | Freq: Two times a day (BID) | ORAL | Status: DC | PRN

## 2015-06-13 ENCOUNTER — Other Ambulatory Visit: Payer: Self-pay

## 2015-06-13 ENCOUNTER — Telehealth: Payer: Self-pay

## 2015-06-13 DIAGNOSIS — D508 Other iron deficiency anemias: Secondary | ICD-10-CM

## 2015-06-13 NOTE — Telephone Encounter (Signed)
Catherine Holmes has a sore on her leg that has become infected, and she is going to be out today while letting this heal.

## 2015-06-15 ENCOUNTER — Encounter: Payer: Medicare Other | Admitting: *Deleted

## 2015-06-15 DIAGNOSIS — I509 Heart failure, unspecified: Secondary | ICD-10-CM

## 2015-06-15 DIAGNOSIS — J449 Chronic obstructive pulmonary disease, unspecified: Secondary | ICD-10-CM | POA: Diagnosis not present

## 2015-06-15 NOTE — Progress Notes (Signed)
Daily Session Note  Patient Details  Name: Catherine Holmes MRN: [id number] Date of Birth: 1927/12/16 Referring Provider:  Dion Body, MD  Encounter Date: 06/15/2015  Check In:     Session Check In - 06/15/15 1216    Check-In   Staff Present Laureen Owens Shark BS, RRT, Respiratory Therapist;Renee Dillard Essex MS, ACSM CEP Exercise Physiologist;Steven Way BS, ACSM EP-C, Exercise Physiologist   ER physicians immediately available to respond to emergencies [facility] immediately available ER MD   Physician(s) Dr. Joni Fears and Dr. Reita Cliche   Medication changes reported     No   Fall or balance concerns reported    No   Warm-up and Cool-down Performed on first and last piece of equipment   VAD Patient? No   Pain Assessment   Currently in Pain? Yes   Pain Score 4    Pain Location Back   Pain Descriptors / Indicators Aching   Pain Type Chronic pain   Pain Onset More than a month ago   Pain Frequency Intermittent   Pain Relieving Factors position, lying down helps.          Goals Met:  Proper associated with RPD/PD & O2 Sat  Goals Unmet:  Not Applicable  Goals Comments: [name] is driven to Pulmonary REhab by her daughter.    Dr. Emily Filbert is Medical Director for [place] and [facility].

## 2015-06-16 ENCOUNTER — Inpatient Hospital Stay: Payer: Medicare Other | Attending: Hematology and Oncology

## 2015-06-16 ENCOUNTER — Inpatient Hospital Stay (HOSPITAL_BASED_OUTPATIENT_CLINIC_OR_DEPARTMENT_OTHER): Payer: Medicare Other | Admitting: Hematology and Oncology

## 2015-06-16 ENCOUNTER — Inpatient Hospital Stay: Payer: Medicare Other

## 2015-06-16 ENCOUNTER — Other Ambulatory Visit: Payer: Self-pay | Admitting: Hematology and Oncology

## 2015-06-16 VITALS — BP 95/61 | HR 74 | Temp 97.7°F | Resp 18 | Wt 163.6 lb

## 2015-06-16 DIAGNOSIS — D649 Anemia, unspecified: Secondary | ICD-10-CM

## 2015-06-16 DIAGNOSIS — M818 Other osteoporosis without current pathological fracture: Secondary | ICD-10-CM

## 2015-06-16 DIAGNOSIS — R55 Syncope and collapse: Secondary | ICD-10-CM | POA: Insufficient documentation

## 2015-06-16 DIAGNOSIS — Z8781 Personal history of (healed) traumatic fracture: Secondary | ICD-10-CM

## 2015-06-16 DIAGNOSIS — Z79899 Other long term (current) drug therapy: Secondary | ICD-10-CM | POA: Diagnosis not present

## 2015-06-16 DIAGNOSIS — I480 Paroxysmal atrial fibrillation: Secondary | ICD-10-CM

## 2015-06-16 DIAGNOSIS — I5032 Chronic diastolic (congestive) heart failure: Secondary | ICD-10-CM | POA: Insufficient documentation

## 2015-06-16 DIAGNOSIS — J449 Chronic obstructive pulmonary disease, unspecified: Secondary | ICD-10-CM | POA: Diagnosis not present

## 2015-06-16 DIAGNOSIS — I2510 Atherosclerotic heart disease of native coronary artery without angina pectoris: Secondary | ICD-10-CM | POA: Insufficient documentation

## 2015-06-16 DIAGNOSIS — D509 Iron deficiency anemia, unspecified: Secondary | ICD-10-CM

## 2015-06-16 DIAGNOSIS — R195 Other fecal abnormalities: Secondary | ICD-10-CM

## 2015-06-16 DIAGNOSIS — K219 Gastro-esophageal reflux disease without esophagitis: Secondary | ICD-10-CM | POA: Diagnosis not present

## 2015-06-16 DIAGNOSIS — Z8701 Personal history of pneumonia (recurrent): Secondary | ICD-10-CM | POA: Insufficient documentation

## 2015-06-16 DIAGNOSIS — I10 Essential (primary) hypertension: Secondary | ICD-10-CM | POA: Insufficient documentation

## 2015-06-16 DIAGNOSIS — E119 Type 2 diabetes mellitus without complications: Secondary | ICD-10-CM

## 2015-06-16 DIAGNOSIS — Z8673 Personal history of transient ischemic attack (TIA), and cerebral infarction without residual deficits: Secondary | ICD-10-CM | POA: Diagnosis not present

## 2015-06-16 DIAGNOSIS — I341 Nonrheumatic mitral (valve) prolapse: Secondary | ICD-10-CM | POA: Diagnosis not present

## 2015-06-16 DIAGNOSIS — Z7982 Long term (current) use of aspirin: Secondary | ICD-10-CM | POA: Insufficient documentation

## 2015-06-16 DIAGNOSIS — Z9114 Patient's other noncompliance with medication regimen: Secondary | ICD-10-CM | POA: Diagnosis not present

## 2015-06-16 DIAGNOSIS — D508 Other iron deficiency anemias: Secondary | ICD-10-CM

## 2015-06-16 DIAGNOSIS — K449 Diaphragmatic hernia without obstruction or gangrene: Secondary | ICD-10-CM | POA: Diagnosis not present

## 2015-06-16 DIAGNOSIS — E871 Hypo-osmolality and hyponatremia: Secondary | ICD-10-CM | POA: Insufficient documentation

## 2015-06-16 DIAGNOSIS — E785 Hyperlipidemia, unspecified: Secondary | ICD-10-CM

## 2015-06-16 LAB — CBC WITH DIFFERENTIAL/PLATELET
Basophils Absolute: 0 10*3/uL (ref 0–0.1)
Basophils Relative: 0 %
Eosinophils Absolute: 0.1 10*3/uL (ref 0–0.7)
Eosinophils Relative: 1 %
HCT: 28.6 % — ABNORMAL LOW (ref 35.0–47.0)
Hemoglobin: 9.4 g/dL — ABNORMAL LOW (ref 12.0–16.0)
Lymphocytes Relative: 9 %
Lymphs Abs: 0.9 10*3/uL — ABNORMAL LOW (ref 1.0–3.6)
MCH: 28 pg (ref 26.0–34.0)
MCHC: 32.7 g/dL (ref 32.0–36.0)
MCV: 85.6 fL (ref 80.0–100.0)
Monocytes Absolute: 0.6 10*3/uL (ref 0.2–0.9)
Monocytes Relative: 6 %
Neutro Abs: 8.8 10*3/uL — ABNORMAL HIGH (ref 1.4–6.5)
Neutrophils Relative %: 84 %
Platelets: 335 10*3/uL (ref 150–440)
RBC: 3.35 MIL/uL — ABNORMAL LOW (ref 3.80–5.20)
RDW: 18.6 % — ABNORMAL HIGH (ref 11.5–14.5)
WBC: 10.4 10*3/uL (ref 3.6–11.0)

## 2015-06-16 LAB — IRON AND TIBC
Iron: 55 ug/dL (ref 28–170)
Saturation Ratios: 14 % (ref 10.4–31.8)
TIBC: 389 ug/dL (ref 250–450)
UIBC: 334 ug/dL

## 2015-06-16 LAB — FERRITIN: Ferritin: 22 ng/mL (ref 11–307)

## 2015-06-16 LAB — SEDIMENTATION RATE: Sed Rate: 46 mm/hr — ABNORMAL HIGH (ref 0–30)

## 2015-06-16 NOTE — Progress Notes (Signed)
Patient's bp is low today that is followed by her PCP and cardiologist.  She reports feeling weakness and tired.

## 2015-06-17 ENCOUNTER — Inpatient Hospital Stay: Payer: Medicare Other

## 2015-06-17 VITALS — BP 102/64 | HR 71 | Temp 97.0°F | Resp 18

## 2015-06-17 DIAGNOSIS — D509 Iron deficiency anemia, unspecified: Secondary | ICD-10-CM

## 2015-06-17 MED ORDER — HEPARIN SOD (PORK) LOCK FLUSH 100 UNIT/ML IV SOLN: 500.0000 [IU] | Freq: Once | INTRAVENOUS | Status: DC | PRN

## 2015-06-17 MED ORDER — SODIUM CHLORIDE 0.9 % IV SOLN
Freq: Once | INTRAVENOUS | Status: DC
  Filled 2015-06-17: qty 1000

## 2015-06-17 MED ORDER — SODIUM CHLORIDE 0.9 % IJ SOLN
3.0000 mL | Freq: Once | INTRAMUSCULAR | Status: DC | PRN
  Filled 2015-06-17: qty 10

## 2015-06-17 MED ORDER — SODIUM CHLORIDE 0.9 % IV SOLN
510.0000 mg | Freq: Once | INTRAVENOUS | Status: DC
  Filled 2015-06-17: qty 17

## 2015-06-17 MED ORDER — SODIUM CHLORIDE 0.9 % IJ SOLN
10.0000 mL | INTRAMUSCULAR | Status: DC | PRN
  Filled 2015-06-17: qty 10

## 2015-06-17 MED ORDER — ALTEPLASE 2 MG IJ SOLR: 2.0000 mg | Freq: Once | INTRAMUSCULAR | Status: DC | PRN

## 2015-06-17 MED ORDER — HEPARIN SOD (PORK) LOCK FLUSH 100 UNIT/ML IV SOLN: 250.0000 [IU] | Freq: Once | INTRAVENOUS | Status: DC | PRN

## 2015-06-17 NOTE — Addendum Note (Signed)
Addended by: Meyer Cory on: 06/17/2015 03:16 PM   Modules accepted: Orders

## 2015-06-22 ENCOUNTER — Encounter: Payer: Medicare Other | Admitting: *Deleted

## 2015-06-22 DIAGNOSIS — J449 Chronic obstructive pulmonary disease, unspecified: Secondary | ICD-10-CM

## 2015-06-22 NOTE — Progress Notes (Signed)
Daily Session Note  Patient Details  Name: Catherine Holmes MRN: [id number] Date of Birth: Mar 05, 1928 Referring Provider:  Juanito Doom, MD  Encounter Date: 06/22/2015  Check In:     Session Check In - 06/22/15 1246    Check-In   Staff Present Laureen Owens Shark BS, RRT, Respiratory Therapist;Izora Benn Dillard Essex MS, ACSM CEP Exercise Physiologist;Steven Way BS, ACSM EP-C, Exercise Physiologist   ER physicians immediately available to respond to emergencies [facility] immediately available ER MD   Physician(s) Cinda Quest and Quale   Medication changes reported     No   Fall or balance concerns reported    No   Warm-up and Cool-down Performed on first and last piece of equipment   VAD Patient? No   Pain Assessment   Currently in Pain? No/denies   Multiple Pain Sites No         Goals Met:  Proper associated with RPD/PD & O2 Sat Independence with exercise equipment Using PLB without cueing & demonstrates good technique Exercise tolerated well Strength training completed today  Goals Unmet:  Not Applicable  Goals Comments: Abiha was in good spirits today and did very well on the exercise equipment.                                    Ms Tsou had a good report from her physician about her cut left leg and was cleared to return to exercise today.   Dr. Emily Filbert is Medical Director for [place] and [facility].

## 2015-06-24 ENCOUNTER — Encounter: Payer: Medicare Other | Attending: Cardiovascular Disease | Admitting: *Deleted

## 2015-06-24 DIAGNOSIS — I5030 Unspecified diastolic (congestive) heart failure: Secondary | ICD-10-CM | POA: Insufficient documentation

## 2015-06-24 DIAGNOSIS — J449 Chronic obstructive pulmonary disease, unspecified: Secondary | ICD-10-CM | POA: Diagnosis not present

## 2015-06-24 DIAGNOSIS — I509 Heart failure, unspecified: Secondary | ICD-10-CM

## 2015-06-24 NOTE — Progress Notes (Signed)
Daily Session Note  Patient Details  Name: Catherine Holmes MRN: [id number] Date of Birth: 25-May-1928 Referring Provider:  Dion Body, MD  Encounter Date: 06/24/2015  Check In:     Session Check In - 06/24/15 1221    Check-In   Staff Present Frederich Cha RRT, RCP Respiratory Therapist;Hridhaan Yohn Dillard Essex MS, ACSM CEP Exercise Physiologist;Carroll Enterkin RN, BSN   ER physicians immediately available to respond to emergencies [facility] immediately available ER MD   Physician(s) Jacqualine Code and Edd Fabian   Medication changes reported     No   Fall or balance concerns reported    No   Warm-up and Cool-down Performed on first and last piece of equipment   VAD Patient? No   Pain Assessment   Currently in Pain? No/denies   Multiple Pain Sites No         Goals Met:  Proper associated with RPD/PD & O2 Sat Independence with exercise equipment Using PLB without cueing & demonstrates good technique Exercise tolerated well Personal goals reviewed Strength training completed today  Goals Unmet:  Not Applicable  Goals Comments: Reviewed goals with Catherine Holmes. She is very satisfied with the [facility] program and her progress.   Dr. Emily Filbert is Medical Director for [place] and [facility].

## 2015-06-29 ENCOUNTER — Encounter: Payer: Medicare Other | Admitting: *Deleted

## 2015-06-29 DIAGNOSIS — J449 Chronic obstructive pulmonary disease, unspecified: Secondary | ICD-10-CM

## 2015-06-29 NOTE — Progress Notes (Signed)
Daily Session Note  Patient Details  Name: JACQUELINNE SPEAK MRN: [id number] Date of Birth: 1928-06-27 Referring Provider:  Juanito Doom, MD  Encounter Date: 06/29/2015  Check In:     Session Check In - 06/29/15 1200    Check-In   Staff Present Carson Myrtle BS, RRT, Respiratory Therapist;Donald Jacque Dillard Essex MS, ACSM CEP Exercise Physiologist;Steven Way BS, ACSM EP-C, Exercise Physiologist   ER physicians immediately available to respond to emergencies [facility] immediately available ER MD   Physician(s) Cinda Quest and Marcelene [place]   Medication changes reported     No   Fall or balance concerns reported    No   Warm-up and Cool-down Performed on first and last piece of equipment   VAD Patient? No   Pain Assessment   Currently in Pain? No/denies   Multiple Pain Sites No         Goals Met:  Proper associated with RPD/PD & O2 Sat Independence with exercise equipment Using PLB without cueing & demonstrates good technique Exercise tolerated well Strength training completed today  Goals Unmet:  Not Applicable  Goals Comments: Yisell reported some tiredness and stress from all of her appointments she has. She has several appointments each week to see various specialists and it is draining on her energy. Despite the tiredness she is thankful to be in class and says she looks forward to seeing everyone in the gym.    Dr. Emily Filbert is Medical Director for [place] and [facility].

## 2015-06-30 ENCOUNTER — Telehealth: Payer: Self-pay

## 2015-06-30 NOTE — Telephone Encounter (Signed)
LMOM that samples of Zetia no longer exist; told her there is a coupon that we have she can pick up.

## 2015-06-30 NOTE — Telephone Encounter (Signed)
Pt daughter would like Zetia samples. Please call.

## 2015-07-01 ENCOUNTER — Encounter: Payer: Self-pay | Admitting: Internal Medicine

## 2015-07-01 ENCOUNTER — Encounter: Payer: Medicare Other | Admitting: *Deleted

## 2015-07-01 DIAGNOSIS — J449 Chronic obstructive pulmonary disease, unspecified: Secondary | ICD-10-CM

## 2015-07-01 NOTE — Progress Notes (Deleted)
Daily Session Note ° °Patient Details  °Name: Catherine Holmes °MRN: [id number] °Date of Birth: 09/07/1928 °Referring Provider:  No ref. provider found ° °Encounter Date: 07/01/2015 ° °Check In: °  °  °Session Check In - 07/01/15 1249   ° Check-In  ° Staff Present Laureen Brown BS, RRT, Respiratory Therapist;Stacey Joyce RRT, RCP Respiratory Therapist;Renee MacMillan MS, ACSM CEP Exercise Physiologist;Susanne Bice RN, BSN, CCRP  ° ER physicians immediately available to respond to emergencies [facility] immediately available ER MD  ° Physician(s) Quale and [place]  ° Medication changes reported     No  ° Fall or balance concerns reported    No  ° Warm-up and Cool-down Performed on first and last piece of equipment  ° VAD Patient? No  ° Pain Assessment  ° Currently in Pain? No/denies  ° Multiple Pain Sites No  °  ° ° ° °  °  °Exercise Prescription Changes - 07/01/15 1200   ° Exercise Review  ° Progression Yes  ° Response to Exercise  ° Blood Pressure (Admit) 100/60 mmHg  ° Blood Pressure (Exercise) 144/74 mmHg  ° Blood Pressure (Exit) 122/72 mmHg  ° Heart Rate (Admit) 71 bpm  ° Heart Rate (Exercise) 70 bpm  ° Heart Rate (Exit) 90 bpm  ° Oxygen Saturation (Admit) 96 %  °2l/m [place]  ° Oxygen Saturation (Exercise) 93 %  °2l/m  ° Oxygen Saturation (Exit) 91 %  ° Rating of Perceived Exertion (Exercise) 14  ° Perceived Dyspnea (Exercise) 4  ° Duration Progress to 30 minutes of continuous aerobic without signs/symptoms of physical distress  ° Intensity THRR unchanged  ° Progression Continue progressive overload as per policy without signs/symptoms or physical distress.  ° Resistance Training  ° Training Prescription Yes  ° Weight 1  ° Reps 10-12  ° Treadmill  ° MPH 1  ° Grade 0  ° Minutes 10  ° Recumbant Bike  ° Level 2  °BioStep  ° RPM 30  ° Minutes 20  ° NuStep  ° Level 2  ° Watts 40  ° Minutes 20  ° Recumbant Elliptical  ° Level 2  ° Watts 30  ° Minutes 10  ° REL-XR  ° Level 2  ° Watts 40  ° Minutes 12  °  ° ° °Goals Met:  °{CHL  AMB CP REHAB GOALS MET:2101600001} ° °Goals Unmet:  °{CHL AMB CP REHAB GOALS UNMET:2101600003} ° °Goals Comments: *** ° ° °Dr. Mark Miller is Medical Director for [facility] and [facility]. °

## 2015-07-01 NOTE — Progress Notes (Signed)
Daily Session Note  Patient Details  Name: Catherine Holmes MRN: [id number] Date of Birth: 1927/12/09 Referring Provider:  Juanito Doom, MD  Encounter Date: 07/01/2015  Check In:     Session Check In - 07/01/15 1249    Check-In   Staff Present Carson Myrtle BS, RRT, Respiratory Therapist;Stacey Blanch Media RRT, RCP Respiratory Therapist;Lucylle Foulkes Dillard Essex MS, ACSM CEP Exercise Physiologist;Susanne Bice RN, BSN, [place]   ER physicians immediately available to respond to emergencies [facility] immediately available ER MD   Physician(s) Jacqualine Code and Joni Fears   Medication changes reported     No   Fall or balance concerns reported    No   Warm-up and Cool-down Performed on first and last piece of equipment   VAD Patient? No   Pain Assessment   Currently in Pain? No/denies   Multiple Pain Sites No           Exercise Prescription Changes - 07/01/15 1200    Exercise Review   Progression Yes   Response to Exercise   Blood Pressure (Admit) 100/60 mmHg   Blood Pressure (Exercise) 144/74 mmHg   Blood Pressure (Exit) 122/72 mmHg   Heart Rate (Admit) 71 bpm   Heart Rate (Exercise) 70 bpm   Heart Rate (Exit) 90 bpm   Oxygen Saturation (Admit) 96 %  2l/m [place]   Oxygen Saturation (Exercise) 93 %  2l/m   Oxygen Saturation (Exit) 91 %   Rating of Perceived Exertion (Exercise) 14   Perceived Dyspnea (Exercise) 4   Duration Progress to 30 minutes of continuous aerobic without signs/symptoms of physical distress   Intensity THRR unchanged   Progression Continue progressive overload as per policy without signs/symptoms or physical distress.   Resistance Training   Training Prescription Yes   Weight 1   Reps 10-12   Treadmill   MPH 1   Grade 0   Minutes 10   Recumbant Bike   Level 2  BioStep   RPM 30   Minutes 20   NuStep   Level 2   Watts 40   Minutes 20   Recumbant Elliptical   Level 2   Watts 30   Minutes 10   REL-XR   Level 2   Watts 40   Minutes 12      Goals Met:  Proper  associated with RPD/PD & O2 Sat Independence with exercise equipment Using PLB without cueing & demonstrates good technique Exercise tolerated well Personal goals reviewed Strength training completed today  Goals Unmet:  Not Applicable  Goals Comments: Reviewed individualized exercise prescription and made increases per departmental policy. Exercise increases were discussed with the patient and they were able to perform the new work loads without issue (no signs or symptoms).     Dr. Emily Filbert is Medical Director for [place] and [facility].

## 2015-07-04 ENCOUNTER — Encounter: Payer: Medicare Other | Admitting: Respiratory Therapy

## 2015-07-04 DIAGNOSIS — J449 Chronic obstructive pulmonary disease, unspecified: Secondary | ICD-10-CM | POA: Diagnosis not present

## 2015-07-04 DIAGNOSIS — I509 Heart failure, unspecified: Secondary | ICD-10-CM

## 2015-07-04 NOTE — Progress Notes (Signed)
Pulmonary Individual Treatment Plan  Patient Details  Name: Catherine Holmes MRN: [id number] Date of Birth: [birth year] Referring Provider:  Lupita Leash, MD  Initial Encounter Date: 04/19/2015  Visit Diagnosis: COPD Mild, CHF  Patient's Home Medications on Admission:  Current outpatient prescriptions:    albuterol (PROAIR HFA) 108 (90 BASE) MCG/ACT inhaler, Inhale 2 puffs into the lungs every 6 (six) hours as needed., Disp: 3 Inhaler, Rfl: 0   albuterol (PROVENTIL) (2.5 MG/3ML) 0.083% nebulizer solution, Take 2.5 mg by nebulization every 6 (six) hours as needed for wheezing or shortness of breath. , Disp: , Rfl:    arformoterol (BROVANA) 15 MCG/2ML NEBU, Take 2 mLs (15 mcg total) by nebulization 2 (two) times daily., Disp: 120 mL, Rfl: 4   aspirin EC 81 MG tablet, Take 81 mg by mouth daily., Disp: , Rfl:    BETA CAROTENE PO, Take by mouth 2 (two) times daily., Disp: , Rfl:    beta carotene w/minerals (OCUVITE) tablet, Take 1 tablet by mouth 2 (two) times daily., Disp: , Rfl:    bumetanide (BUMEX) 1 MG tablet, Take 1 tablet (1 mg total) by mouth daily. Take 1 tab daily (Patient taking differently: Take 1 mg by mouth as needed. Take 1 tab daily), Disp: 180 tablet, Rfl: 3   calcium carbonate (OS-CAL) 600 MG TABS tablet, Take 600 mg by mouth daily with breakfast., Disp: , Rfl:    cephALEXin (KEFLEX) 500 MG capsule, , Disp: , Rfl:    Cholecalciferol (VITAMIN D-3 PO), Take 1,000 Units by mouth., Disp: , Rfl:    citalopram (CELEXA) 20 MG tablet, Take 20 mg by mouth daily., Disp: , Rfl:    diphenhydramine-acetaminophen (TYLENOL PM) 25-500 MG TABS, Take 1 tablet by mouth at bedtime as needed., Disp: , Rfl:    doxazosin (CARDURA) 4 MG tablet, Take 1 tablet (4 mg total) by mouth daily., Disp: , Rfl:    ezetimibe (ZETIA) 10 MG tablet, Take 5 mg by mouth daily., Disp: , Rfl:    fluticasone (FLONASE) 50 MCG/ACT nasal spray, Place 1 spray into both nostrils daily., Disp: , Rfl:     gabapentin (NEURONTIN) 100 MG capsule, Take 100 mg by mouth 2 (two) times daily. , Disp: , Rfl:    hydrALAZINE (APRESOLINE) 50 MG tablet, Take 50 mg by mouth as needed. , Disp: , Rfl:    HYDROcodone-acetaminophen (NORCO/VICODIN) 5-325 MG per tablet, Take 1 tablet by mouth as needed for severe pain. , Disp: , Rfl:    mometasone-formoterol (DULERA) 100-5 MCG/ACT AERO, Inhale 2 puffs into the lungs 2 (two) times daily. , Disp: , Rfl:    Multiple Vitamin (MULTIVITAMIN) tablet, Take 1 tablet by mouth daily.  , Disp: , Rfl:    mupirocin ointment (BACTROBAN) 2 %, Apply topically., Disp: , Rfl:    nebivolol (BYSTOLIC) 5 MG tablet, Take 1 tablet (5 mg total) by mouth daily., Disp: 30 tablet, Rfl: 0   nitroGLYCERIN (NITROSTAT) 0.4 MG SL tablet, Place 1 tablet (0.4 mg total) under the tongue every 5 (five) minutes as needed for chest pain., Disp: 25 tablet, Rfl: 6   potassium chloride (K-DUR,KLOR-CON) 10 MEQ tablet, Take 0.5 tablets (5 mEq total) by mouth 2 (two) times daily as needed. Patient takes this when she takes Bumex., Disp: 60 tablet, Rfl: 3   spironolactone (ALDACTONE) 25 MG tablet, Take 1 tablet (25 mg total) by mouth daily., Disp: 90 tablet, Rfl: 3   tiotropium (SPIRIVA) 18 MCG inhalation capsule, Place 1 capsule (18 mcg  total) into inhaler and inhale daily., Disp: 90 capsule, Rfl: 0   triamcinolone (KENALOG) 0.025 % cream, Apply 1 application topically daily as needed (for arms)., Disp: , Rfl:    trimethoprim-polymyxin b (POLYTRIM) ophthalmic solution, Place 1 drop into both eyes 2 (two) times daily., Disp: , Rfl:  No current facility-administered medications for this visit.  Facility-Administered Medications Ordered in Other Visits:    0.9 %  sodium chloride infusion, , Intravenous, Once, Catherine Bath, MD   alteplase (CATHFLO ACTIVASE) injection 2 mg, 2 mg, Intracatheter, Once PRN, Catherine Bath, MD   ferumoxytol (FERAHEME) 510 mg in sodium chloride 0.9 % 100 mL IVPB,  510 mg, Intravenous, Once, Catherine Bath, MD   heparin lock flush 100 unit/mL, 500 Units, Intracatheter, Once PRN, Catherine Bath, MD   heparin lock flush 100 unit/mL, 250 Units, Intracatheter, Once PRN, Catherine Bath, MD   sodium chloride 0.9 % injection 10 mL, 10 mL, Intracatheter, PRN, Catherine Bath, MD   sodium chloride 0.9 % injection 3 mL, 3 mL, Intravenous, Once PRN, Catherine Bath, MD  Past Medical History: Past Medical History  Diagnosis Date   Essential hypertension    Diabetes mellitus type II, controlled    COPD (chronic obstructive pulmonary disease)    Osteoporosis    Carotid arterial disease     a. 2012 Carotid dopplers: 40-59% R ICA, 60-79% L ICA   [place]    Arthralgia    Sjoegren syndrome    TIA (transient ischemic attack)    Emphysema    MVP (mitral valve prolapse)    Chronic diastolic CHF (congestive heart failure)     a. 08/2011 Echo: EF 65-70%, mild LVH, mod dil LA, mildly dil RA, mild MR, mild-mod AoV Sclerosis w/o stenosis, mod-sev elev PA pressures, mild TR.   Acid reflux    Diverticulitis    Anemia    History of pneumonia    Syncope and collapse    Anemia    Hip fracture, left    Hyperlipidemia    Hiatal hernia    Chest pain     a. 08/2011 MV: EF 74%, no ischemia.   Noncompliance     a. h/o not taking bumex as Rx.   PAF (paroxysmal atrial fibrillation)     a. CHA2DS2VASc = 8 ->No OAC 2/2 falls.    Tobacco Use: History  Smoking status   Former Smoker -- 0.50 packs/day for 30 years   Types: Cigarettes   Quit date: 10/22/1994  Smokeless tobacco   Never Used    Labs: Recent Review Flowsheet Data    There is no flowsheet data to display.         POCT Glucose      04/27/15 1130 05/04/15 1125         POCT Blood Glucose   Pre-Exercise 115 mg/dL       Post-Exercise 960 mg/dL       Pre-Exercise #2  99 mg/dL      Post-Exercise #2  109 mg/dL      Pre-Exercise #3  454 mg/dL       Post-Exercise #3  102 mg/dL         ADL UCSD:     ADL UCSD      04/19/15 1305       ADL UCSD   ADL Phase Entry     SOB Score total 92     Rest 1     Walk 4  Stairs 5     Holmes 4     Dress 5     Shop 5         Pulmonary Function Assessment:     Pulmonary Function Assessment - 04/19/15 1130    Pulmonary Function Tests   FVC% 36 %   FEV1% 35 %   FEV1/FVC Ratio 71.7   Breath   Bilateral Breath Sounds Clear;Decreased   Shortness of Breath Yes      Exercise Target Goals:    Exercise Program Goal: Individual exercise prescription set with THRR, safety & activity barriers. Participant demonstrates ability to understand and report RPE using BORG scale, to self-measure pulse accurately, and to acknowledge the importance of the exercise prescription.  Exercise Prescription Goal: Starting with aerobic activity 30 plus minutes a day, 3 days per week for initial exercise prescription. Provide home exercise prescription and guidelines that participant acknowledges understanding prior to discharge.  Activity Barriers & Risk Stratification:     Activity Barriers & Risk Stratification - 04/19/15 1130    Activity Barriers & Risk Stratification   Activity Barriers Shortness of Breath;Balance Concerns;Deconditioning;Assistive Device   Risk Stratification Moderate      6 Minute Walk:     6 Minute Walk      04/19/15 1420       6 Minute Walk   Phase Initial     Distance 180 feet     Walk Time 3 minutes     Resting HR 67 bpm     Resting BP 122/70 mmHg     Max Ex. HR 82 bpm     Max Ex. BP 132/74 mmHg     RPE 15     Perceived Dyspnea  4     Symptoms No        Initial Exercise Prescription:     Initial Exercise Prescription - 04/19/15 1500    Date of Initial Exercise Prescription   Date 04/19/15   Treadmill   MPH 1   Grade 0   Minutes 10   Recumbant Bike   Level 1   RPM 40   Watts 20   Minutes 10   NuStep   Level 2   Watts 40   Minutes 10    Arm Ergometer   Level 1   Watts 10   Minutes 10   REL-XR   Level 1   Watts 40   Minutes 10   Prescription Details   Frequency (times per week) 3   Duration Progress to 30 minutes of continuous aerobic without signs/symptoms of physical distress   Intensity   THRR REST +  30   Ratings of Perceived Exertion 11-15   Perceived Dyspnea 2-4   Progression Continue progressive overload as per policy without signs/symptoms or physical distress.   Resistance Training   Training Prescription Yes   Weight 1   Reps 10-12      Exercise Prescription Changes:     Exercise Prescription Changes      04/27/15 1500 04/29/15 1200 05/04/15 1500 05/09/15 1500 06/06/15 1500   Exercise Review   Progression   Yes Yes Yes   Response to Exercise   Blood Pressure (Admit) 118/70 mmHg 118/70 mmHg 120/78 mmHg 120/78 mmHg 100/60 mmHg   Blood Pressure (Exercise) 130/70 mmHg 130/70 mmHg 130/68 mmHg 130/68 mmHg 144/74 mmHg   Blood Pressure (Exit) 120/62 mmHg 120/62 mmHg 110/66 mmHg 110/66 mmHg 122/72 mmHg   Heart Rate (Admit) 68 bpm 68 bpm 65 bpm 65  bpm 71 bpm   Heart Rate (Exercise) 105 bpm 105 bpm 78 bpm 78 bpm 70 bpm   Heart Rate (Exit) 90 bpm 90 bpm 63 bpm 63 bpm 90 bpm   Oxygen Saturation (Admit) 92 % 92 % 93 %  2l/m [place] 93 %  2l/m [place] 96 %  2l/m [place]   Oxygen Saturation (Exercise) 90 % 90 % 88 % 88 % 93 %  2l/m   Oxygen Saturation (Exit) 95 % 95 % 96 % 96 % 91 %   Rating of Perceived Exertion (Exercise) 13 13 15 15 14    Perceived Dyspnea (Exercise) 3 3 4 4 4    Resistance Training   Training Prescription Yes Yes Yes Yes Yes   Weight 1 1 1 1 1    Treadmill   MPH 0.8 0.8 1 1 1    Grade 0 0 0 0 0   Minutes 8 8 10 10 10    Recumbant Bike   Level 2 2 1 1 2   BioStep   RPM 24 24 40 40 30   Minutes 10 10 10 10 20    NuStep   Level 2 2 2 2 2    Watts 20 20 40 40 40   Minutes 10 10 10 10 20    Recumbant Elliptical   Level  2  BIOSTEP      Watts  30      Minutes  10        07/01/15 1200 07/04/15 1500          Exercise Review   Progression Yes Yes      Response to Exercise   Blood Pressure (Admit) 100/60 mmHg 122/70 mmHg      Blood Pressure (Exercise) 144/74 mmHg 126/74 mmHg      Blood Pressure (Exit) 122/72 mmHg       Heart Rate (Admit) 71 bpm 71 bpm      Heart Rate (Exercise) 70 bpm 80 bpm      Heart Rate (Exit) 90 bpm       Oxygen Saturation (Admit) 96 %  2l/m [place] 97 %  2l/m [place]      Oxygen Saturation (Exercise) 93 %  2l/m 96 %  2l/m      Oxygen Saturation (Exit) 91 %       Rating of Perceived Exertion (Exercise) 14 11      Perceived Dyspnea (Exercise) 4 2      Duration Progress to 30 minutes of continuous aerobic without signs/symptoms of physical distress Progress to 30 minutes of continuous aerobic without signs/symptoms of physical distress      Intensity THRR unchanged THRR unchanged      Progression Continue progressive overload as per policy without signs/symptoms or physical distress. Continue progressive overload as per policy without signs/symptoms or physical distress.      Resistance Training   Training Prescription Yes Yes      Weight 1 1      Reps 10-12 10-12      Treadmill   MPH 1       Grade 0 0      Minutes 10       Recumbant Bike   Level 2  BioStep 3  BioStep      RPM 30       Watts  35      Minutes 20 20      NuStep   Level 2 2      Watts 40 30  Minutes 20 20      Recumbant Elliptical   Level 2       Watts 30       Minutes 10       REL-XR   Level 2       Watts 40       Minutes 12          Discharge Exercise Prescription (Final Exercise Prescription Changes):     Exercise Prescription Changes - 07/04/15 1500    Exercise Review   Progression Yes   Response to Exercise   Blood Pressure (Admit) 122/70 mmHg   Blood Pressure (Exercise) 126/74 mmHg   Heart Rate (Admit) 71 bpm   Heart Rate (Exercise) 80 bpm   Oxygen Saturation (Admit) 97 %  2l/m [place]   Oxygen Saturation (Exercise) 96 %  2l/m   Rating of Perceived Exertion  (Exercise) 11   Perceived Dyspnea (Exercise) 2   Duration Progress to 30 minutes of continuous aerobic without signs/symptoms of physical distress   Intensity THRR unchanged   Progression Continue progressive overload as per policy without signs/symptoms or physical distress.   Resistance Training   Training Prescription Yes   Weight 1   Reps 10-12   Treadmill   Grade 0   Recumbant Bike   Level 3  BioStep   Watts 35   Minutes 20   NuStep   Level 2   Watts 30   Minutes 20       Nutrition:  Target Goals: Understanding of nutrition guidelines, daily intake of sodium 1500mg , cholesterol 200mg , calories 30% from fat and 7% or less from saturated fats, daily to have 5 or more servings of fruits and vegetables.  Biometrics:     Pre Biometrics - 04/19/15 1424    Pre Biometrics   Height 5' (1.524 m)   Weight 165 lb (74.844 kg)   Waist Circumference 41 inches   Hip Circumference 45 inches   Waist to Hip Ratio 0.91 %   BMI (Calculated) 32.3       Nutrition Therapy Plan and Nutrition Goals:     Nutrition Therapy & Goals - 04/19/15 1312    Nutrition Therapy   Diet Ms Gatliff would like to meet with the dietitian. her goal is to lose 15lbs.      Nutrition Discharge: Rate Your Plate Scores:   Psychosocial: Target Goals: Acknowledge presence or absence of depression, maximize coping skills, provide positive support system. Participant is able to verbalize types and ability to use techniques and skills needed for reducing stress and depression.  Initial Review & Psychosocial Screening:     Initial Psych Review & Screening - 04/19/15 1130    Barriers   Psychosocial barriers to participate in program There are no identifiable barriers or psychosocial needs.  Ms Bosher has good family support from her daughter who lives with Ms Rainone. Ms Choe just lost her son in December 2015 and indicated depression over this loss.   Screening Interventions   Interventions Program  counselor consult;Encouraged to exercise      Quality of Life Scores:   PHQ-9:     Recent Review Flowsheet Data    There is no flowsheet data to display.      Psychosocial Evaluation and Intervention:   Psychosocial Re-Evaluation:  Education: Education Goals: Education classes will be provided on a weekly basis, covering required topics. Participant will state understanding/return demonstration of topics presented.  Learning Barriers/Preferences:     Learning Barriers/Preferences - 04/19/15  1130    Learning Barriers/Preferences   Learning Barriers None   Learning Preferences Group Instruction;Individual Instruction;Pictoral;Skilled Demonstration;Written Material;Video;Verbal Instruction      Education Topics: Initial Evaluation Education: - Verbal, written and demonstration of respiratory meds, RPE/PD scales, oximetry and breathing techniques. Instruction on use of nebulizers and MDIs: cleaning and proper use, rinsing mouth with steroid doses and importance of monitoring MDI activations.          Pulmonary Rehab from 06/29/2015 in [facility]   Date  04/19/15   Educator  LB   Instruction Review Code  2- meets goals/outcomes      General Nutrition Guidelines/Fats and Fiber: -Group instruction provided by verbal, written material, models and posters to present the general guidelines for heart healthy nutrition. Gives an explanation and review of dietary fats and fiber.      Pulmonary Rehab from 06/29/2015 in [facility]   Date  06/06/15   Educator  C. Perlie Gold, RD   Instruction Review Code  2- meets goals/outcomes      Controlling Sodium/Reading Food Labels: -Group verbal and written material supporting the discussion of sodium use in heart healthy nutrition. Review and explanation with models, verbal and written materials for utilization of the food label.   Exercise Physiology & Risk  Factors: - Group verbal and written instruction with models to review the exercise physiology of the cardiovascular system and associated critical values. Details cardiovascular disease risk factors and the goals associated with each risk factor.      Pulmonary Rehab from 06/29/2015 in [facility]   Date  06/29/15   Educator  SW   Instruction Review Code  2- meets goals/outcomes      Aerobic Exercise & Resistance Training: - Gives group verbal and written discussion on the health impact of inactivity. On the components of aerobic and resistive training programs and the benefits of this training and how to safely progress through these programs.      Pulmonary Rehab from 06/29/2015 in [facility]   Date  04/27/15   Educator  SW   Instruction Review Code  2- meets goals/outcomes      Flexibility, Balance, General Exercise Guidelines: - Provides group verbal and written instruction on the benefits of flexibility and balance training programs. Provides general exercise guidelines with specific guidelines to those with heart or lung disease. Demonstration and skill practice provided.   Stress Management: - Provides group verbal and written instruction about the health risks of elevated stress, cause of high stress, and healthy ways to reduce stress.   Depression: - Provides group verbal and written instruction on the correlation between heart/lung disease and depressed mood, treatment options, and the stigmas associated with seeking treatment.   Exercise & Equipment Safety: - Individual verbal instruction and demonstration of equipment use and safety with use of the equipment.      Pulmonary Rehab from 06/29/2015 in [facility]   Date  04/27/15   Educator  RM   Instruction Review Code  2- meets goals/outcomes      Infection Prevention: - Provides verbal and written material to  individual with discussion of infection control including proper hand washing and proper equipment cleaning during exercise session.      Pulmonary Rehab from 06/29/2015 in [facility]   Date  04/27/15   Educator  LB   Instruction Review Code  2- meets goals/outcomes      Falls Prevention: - Provides verbal and written material to individual with discussion of falls prevention and safety.      Pulmonary Rehab from 06/29/2015 in [facility]   Date  04/19/15   Educator  LB   Instruction Review Code  2- meets goals/outcomes      Diabetes: - Individual verbal and written instruction to review signs/symptoms of diabetes, desired ranges of glucose level fasting, after meals and with exercise. Advice that pre and post exercise glucose checks will be done for 3 sessions at entry of program.      Pulmonary Rehab from 06/29/2015 in [facility]   Date  06/24/15 [Know your numbers]   Educator  CE   Instruction Review Code  2- meets goals/outcomes      Chronic Lung Diseases: - Group verbal and written instruction to review new updates, new respiratory medications, new advancements in procedures and treatments. Provide informative websites and "800" numbers of self-education.      Pulmonary Rehab from 06/29/2015 in [facility]   Date  05/30/15   Educator  Hulen Luster, RRT   Instruction Review Code  2- meets goals/outcomes      Lung Procedures: - Group verbal and written instruction to describe testing methods done to diagnose lung disease. Review the outcome of test results. Describe the treatment choices: Pulmonary Function Tests, ABGs and oximetry.      Pulmonary Rehab from 06/29/2015 in [facility]   Date  05/27/15   Educator  sj   Instruction Review Code  2- meets goals/outcomes      Energy Conservation: -  Provide group verbal and written instruction for methods to conserve energy, plan and organize activities. Instruct on pacing techniques, use of adaptive equipment and posture/positioning to relieve shortness of breath.      Pulmonary Rehab from 06/29/2015 in [facility]   Date  06/08/15   Educator  SW   Instruction Review Code  2- meets goals/outcomes      Triggers: - Group verbal and written instruction to review types of environmental controls: home humidity, furnaces, filters, dust mite/pet prevention, HEPA vacuums. To discuss weather changes, air quality and the benefits of nasal washing.   Exacerbations: - Group verbal and written instruction to provide: warning signs, infection symptoms, calling MD promptly, preventive modes, and value of vaccinations. Review: effective airway clearance, coughing and/or vibration techniques. Create an Sport and exercise psychologist.   Oxygen: - Individual and group verbal and written instruction on oxygen therapy. Includes supplement oxygen, available portable oxygen systems, continuous and intermittent flow rates, oxygen safety, concentrators, and Medicare reimbursement for oxygen.      Pulmonary Rehab from 06/29/2015 in [facility]   Date  04/19/15   Educator  LB   Instruction Review Code  2- meets goals/outcomes      Respiratory Medications: - Group verbal and written instruction to review medications for lung disease. Drug class, frequency, complications, importance of spacers, rinsing mouth after steroid MDI's, and proper cleaning methods for nebulizers.      Pulmonary Rehab from 06/29/2015 in [facility]   Date  04/19/15   Educator  LB   Instruction Review Code  2- meets goals/outcomes      AED/CPR: - Group verbal and written instruction with the use of models to demonstrate the basic  use of the AED with the basic ABC's of resuscitation.       Pulmonary Rehab from 06/29/2015 in [facility]   Date  06/03/15   Educator  CE   Instruction Review Code  2- meets goals/outcomes      Breathing Retraining: - Provides individuals verbal and written instruction on purpose, frequency, and proper technique of diaphragmatic breathing and pursed-lipped breathing. Applies individual practice skills.      Pulmonary Rehab from 06/29/2015 in [facility]   Date  04/27/15   Educator  LB   Instruction Review Code  2- meets goals/outcomes      Anatomy and Physiology of the Lungs: - Group verbal and written instruction with the use of models to provide basic lung anatomy and physiology related to function, structure and complications of lung disease.   Heart Failure: - Group verbal and written instruction on the basics of heart failure: signs/symptoms, treatments, explanation of ejection fraction, enlarged heart and cardiomyopathy.   Sleep Apnea: - Individual verbal and written instruction to review Obstructive Sleep Apnea. Review of risk factors, methods for diagnosing and types of masks and machines for OSA.   Anxiety: - Provides group, verbal and written instruction on the correlation between heart/lung disease and anxiety, treatment options, and management of anxiety.      Pulmonary Rehab from 06/29/2015 in [facility]   Date  06/15/15   Educator  Heloise Ochoa, MSW   Instruction Review Code  2- Meets goals/outcomes      Relaxation: - Provides group, verbal and written instruction about the benefits of relaxation for patients with heart/lung disease. Also provides patients with examples of relaxation techniques.   Knowledge Questionnaire Score:     Knowledge Questionnaire Score - 04/19/15 1130    Knowledge Questionnaire Score   Pre Score -3      Personal Goals and Risk Factors at Admission:     Personal Goals and Risk  Factors at Admission - 04/19/15 1130    Personal Goals and Risk Factors on Admission    Weight Management Yes   Intervention Learn and follow the exercise and diet guidelines while in the program. Utilize the nutrition and education classes to help gain knowledge of the diet and exercise expectations in the program  Ms Christmas would like to meet with the dietitian.  Her goal is to lose 15lbs. She lives with her daughter and they do eat alot of fruits and vegetables; no salt.   Admit Weight 165 lb (74.844 kg)   Goal Weight 150 lb (68.04 kg)   Increase Aerobic Exercise and Physical Activity Yes   Intervention While in program, learn and follow the exercise prescription taught. Start at a low level workload and increase workload after able to maintain previous level for 30 minutes. Increase time before increasing intensity.  Ms Oregel does have a pedal machine. She would like to strength herself where she is not so dependent on her cane.   Understand more about Heart/Pulmonary Disease. Yes   Intervention While in program utilize professionals for any questions, and attend the education sessions. Great websites to use are www.americanheart.org or www.lung.org for reliable information.  Ms Deane has COPD and CHF and is interested in learning more to manage her diseases.   Improve shortness of breath with ADL's Yes   Intervention While in program, learn and follow the exercise prescription taught. Start at a low level workload and increase workload  ad advised by the exercise physiologist. Increase time before increasing intensity.  Ms Mione scored a 55 on the [employer] SOB Questionnaire. She would like to do more activity without shortness of breath.   Develop more efficient breathing techniques such as purse lipped breathing and diaphragmatic breathing; and practicing self-pacing with activity Yes   Intervention While in program, learn and utilize the specific breathing techniques taught to you. Continue to  practice and use the techniques as needed.   Increase knowledge of respiratory medications and ability to use respiratory devices properly.  Yes   Intervention While in program learn and demonstrate appropriate use of your oxygen therapy by increasing flow with exertion, manage oxygen tank operation, including continuous and intermittent flow.  Understanding oxygen is a drug ordered by your physician.;While in program, learn to administer MDI, nebulizer, and spacer properly.;Learn to take respiratory medicine as ordered.;While in program, learn to Clean MDI, nebulizers, and spacers properly.  Ms Shull has a good understanding of her Annie Sable, ProAir, Spacer, and Albuterol  nebulizer.  She uses a gas portable O2 tank, 2l/m , and her service is from [facility].   Diabetes Yes   Hypertension Yes   Lipids Yes      Personal Goals and Risk Factors Review:      Goals and Risk Factor Review      05/30/15 1130 06/03/15 1610 06/06/15 1130 06/24/15 1259 07/01/15 1130   Weight Management   Goals Progress/Improvement seen   Yes Yes    Comments   Ms Tokar has lose 5lbs since her start date in [facility]. She would like to meet with the dietitian. Shameeka wore a shirt today that she couldn't button before she was in this program and has seen her "belly go down". She has noticed changes in her body composition and is very impressed with the results from the exercise. She is down 6 lbs since starting in the program and weighs 163 with the goal of getting down to 150-155 lbs.     Increase Aerobic Exercise and Physical Activity   Goals Progress/Improvement seen  Yes   Yes    Comments Ms Mathies worked hard today to meet her exercise goals on the NS and BioStep - total exercise time ; plus she stayed for weight training; she states the importance for her and her health to be active.    The exercise equipment is getting easier for Blu and she has been able to make increases since starting. She  really enjoys the equipment and has noticed she can get up easier and can do more at the house since starting [facility].     Understand more about Heart/Pulmonary Disease   Goals Progress/Improvement seen    Yes Yes    Comments   Ms Ballinas enjoys the education and states she has learned more new information for disease management. The education has been very helpful and Sydny has learned a lot about pulmonary disease and how to manage it. She especially has learned a lot from one on one conversations with Laureen.     Improve shortness of breath with ADL's   Goals Progress/Improvement seen    Yes Yes    Comments   Ms Julin states she has noticed a difference in her shortness of breath with activites at home, especially her morning routine. Sydnee is able to move around her house better and isn't as tired when she goes out for appointments. She has noticed that she does not need her  oxygen as much since starting this program and is less short of breath.    Breathing Techniques   Goals Progress/Improvement seen  Yes   Yes    Comments Ms Kranz uses PLB with her exercise goals and finds it very helpful.   Roy uses the breathing techniques she has learned in class when she is at home; she tries to do diaphramatic breathing 3 times a day for 10 repetitions. She also uses pursed lipped breathing when on the machines in class.     Increase knowledge of respiratory medications   Goals Progress/Improvement seen   Yes   Yes   Comments  Ms. Strutz was given a spacer and shown how to use it with her MDI's.  She seemed very impressed with the whistle feature on the spacer and said that it would help her to take her medication correctly..   Ms Rosetti has her MDI's arranged by her chair and knows when to take her inhalers. Her daughter also helps with her schedule. She is using her oxygen at home and sith some activity.      Personal Goals Discharge:    Comments: 30 Day Review

## 2015-07-05 ENCOUNTER — Encounter: Payer: Self-pay | Admitting: Respiratory Therapy

## 2015-07-05 DIAGNOSIS — I509 Heart failure, unspecified: Secondary | ICD-10-CM

## 2015-07-05 DIAGNOSIS — J449 Chronic obstructive pulmonary disease, unspecified: Secondary | ICD-10-CM

## 2015-07-05 NOTE — Progress Notes (Signed)
Daily Session Note  Patient Details  Name: Catherine Holmes MRN: [id number] Date of Birth: 05/03/1928 Referring Provider:  Dr Simonne Maffucci  Encounter Date: 07/04/2015  Check In:     Session Check In - 07/05/15 0623    Check-In   Staff Present Carson Myrtle BS, RRT, Respiratory Therapist;Steven Way BS, ACSM EP-C, Exercise Physiologist   ER physicians immediately available to respond to emergencies [facility] immediately available ER MD   Physician(s) Eveline Keto           Exercise Prescription Changes - 07/04/15 1500    Exercise Review   Progression Yes   Response to Exercise   Blood Pressure (Admit) 122/70 mmHg   Blood Pressure (Exercise) 126/74 mmHg   Heart Rate (Admit) 71 bpm   Heart Rate (Exercise) 80 bpm   Oxygen Saturation (Admit) 97 %  2l/m Nelson   Oxygen Saturation (Exercise) 96 %  2l/m   Rating of Perceived Exertion (Exercise) 11   Perceived Dyspnea (Exercise) 2   Duration Progress to 30 minutes of continuous aerobic without signs/symptoms of physical distress   Intensity THRR unchanged   Progression Continue progressive overload as per policy without signs/symptoms or physical distress.   Resistance Training   Training Prescription Yes   Weight 1   Reps 10-12   Treadmill   Grade 0   Recumbant Bike   Level 3  BioStep   Watts 35   Minutes 20   NuStep   Level 2   Watts 30   Minutes 20      Goals Met:  Proper associated with RPD/PD & O2 Sat Using PLB without cueing & demonstrates good technique Exercise tolerated well  Goals Unmet:  Not Applicable  Goals Comments: [name] needs encouragement to keep up her goals with watts. Having her on 2 pieces of equipment has worked well and has allowed her to participate in strength exercise.   Dr. Emily Filbert is Medical Director for [facility] and [facility].

## 2015-07-06 ENCOUNTER — Telehealth: Payer: Self-pay

## 2015-07-06 DIAGNOSIS — R609 Edema, unspecified: Secondary | ICD-10-CM

## 2015-07-06 NOTE — Telephone Encounter (Signed)
Pt daughter called , has a question regarding pt spironlactone, states the pharmacy last filled 25 mg, asks if this is correct. Also, states that pt has been taking 50 mg every day since her last visit to Dr. Mariah Milling . Also, her PCP wants to take her off of this medicaiton,and her potassium,  and only take Bumex when needed. Please call and advise.

## 2015-07-06 NOTE — Telephone Encounter (Signed)
Recent lab work shows high potassium 5.4 For now could stop spironolactone  I'm very concerned with stopping Bumex as in the past this has led to heart failure and hospitalization.  Recent lab work also shows sodium level is low Would cut has to clean back on free water. May need to increase salt intake, less water  Would skip 3-4 days of potassium supplement Recheck BMP in 2 weeks time We would be watching sodium level

## 2015-07-06 NOTE — Telephone Encounter (Signed)
Catherine Holmes called to let us know she would not be in class today due to a conflicting doctor appt.

## 2015-07-06 NOTE — Telephone Encounter (Signed)
Spoke w/ Eunice Blase. She reports that pt's PCP made some changes to pt's meds and she wants to make sure that Dr. Mariah Milling is ok w/ them.  She reports that pt has been taking spironolactone 50 mg daily, as she was unaware that this was decreased to 25 mg daily at last hospitalization in June. She states that Dr. Burnadette Pop wants to stop spirononolactone completely and only take Bumex as needed.  She is going on vacation next week and would like to know if Dr. Mariah Milling is agreeable to these changes before they go out of town.

## 2015-07-07 NOTE — Telephone Encounter (Signed)
Spoke w/ Eunice Blase.  She reports that pt just had a dizzy spell and is sitting down. Pt's BP is 157/70. Advised her of Dr. Windell Hummingbird recommendation.  Discussed pt's sodium level w/ Debbie.  She reports that pt does not drink any free water. She drinks coffee in the am, diet soda through the day and tea w/ supper.  She will try to have pt increase her sodium intake.  She is sched to come in for labs w/ PCP on 9/28, she will ask them to draw our BMET at that time. She verbalizes understanding of instructions and will call back w/ any other questions or concerns.

## 2015-07-08 ENCOUNTER — Encounter: Payer: Medicare Other | Admitting: *Deleted

## 2015-07-08 DIAGNOSIS — J449 Chronic obstructive pulmonary disease, unspecified: Secondary | ICD-10-CM | POA: Diagnosis not present

## 2015-07-08 DIAGNOSIS — I509 Heart failure, unspecified: Secondary | ICD-10-CM

## 2015-07-08 NOTE — Progress Notes (Signed)
Daily Session Note  Patient Details  Name: CHEYANNA STRICK MRN: [id number] Date of Birth: [birth year] Referring Provider:  Juanito Doom, MD  Encounter Date: 07/08/2015  Check In:     Session Check In - 07/08/15 1304    Check-In   Staff Present Candiss Norse MS, ACSM CEP Exercise Physiologist;Stacey Blanch Media RRT, RCP Respiratory Therapist;Carroll Enterkin RN, BSN   ER physicians immediately available to respond to emergencies [facility] immediately available ER MD   Physician(s) Dr. Jimmye Norman, Dr. Burlene Arnt   Medication changes reported     No   Warm-up and Cool-down Performed on first and last piece of equipment   Pain Assessment   Currently in Pain? Other (Comment)  No change in her chronic aches and pains         Goals Met:  Proper associated with RPD/PD & O2 Sat Exercise tolerated well  Goals Unmet:  Not Applicable  Goals Comments: Brandice is 81 years old and believes she is doing the best she can do since she is a little tired since she has had doctors appts this week. Betzayda Reports her Primary Care MD Dr. Carlean Jews. Told her that her HBA1c is good at 5.6. She has been off her Metaformin for over a year.   Dr. Emily Filbert is Medical Director for [facility] and [facility].

## 2015-07-18 ENCOUNTER — Other Ambulatory Visit: Payer: Medicare Other

## 2015-07-21 ENCOUNTER — Telehealth: Payer: Self-pay

## 2015-07-21 NOTE — Telephone Encounter (Signed)
Would not transfer unless she needs a higher level of care And if she did, would go to closest hospital, not necessarily [facility] I'm sorry she required such urgent therapy/CPR Our prayers are with her Please keep [place] posted on her improvement Let us know if there is anything we can do from our end

## 2015-07-21 NOTE — Telephone Encounter (Addendum)
Eunice Blase is calling to let us know that pt is in ICU at [facility] w/ possible pneumonia.  She wants to know if Dr. Mariah Milling thinks that pt should be transferred here.
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
## 2015-07-22 NOTE — Telephone Encounter (Signed)
Spoke w/ Eunice Blase.  Advised her of Dr. Windell Hummingbird recommendation.  She verbalizes understanding, states that they were going to move pt to regular floor, but don't have an open bed, so pt is still in ICU. Pt got on the phone and asks if I can speak w/ someone there to have ambulance transport her home, as she does not think she can ride in a regular car. Advised her that I cannot do that, but she can speak w/ the hospital about arranging transportation.  Pt was agitated and did not want to talk long.  Asked her to call back if we can be of further assistance.

## 2015-07-25 ENCOUNTER — Inpatient Hospital Stay: Payer: Medicare Other

## 2015-07-26 ENCOUNTER — Telehealth: Payer: Self-pay | Admitting: Respiratory Therapy
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
## 2015-07-27 ENCOUNTER — Encounter
Admission: RE | Admit: 2015-07-27 | Discharge: 2015-07-27 | Disposition: A | Discharge status: Home / Self Care | Payer: Medicare Other | Source: Ambulatory Visit | Attending: Internal Medicine | Admitting: Internal Medicine

## 2015-07-27 ENCOUNTER — Emergency Department
Admission: EM | Admit: 2015-07-27 | Discharge: 2015-07-28 | Disposition: A | Discharge status: Skilled Nursing Facility | Payer: Medicare Other | Source: Home / Self Care | Attending: Emergency Medicine | Admitting: Emergency Medicine

## 2015-07-27 ENCOUNTER — Emergency Department: Payer: Medicare Other

## 2015-07-27 DIAGNOSIS — G8929 Other chronic pain: Secondary | ICD-10-CM | POA: Diagnosis not present

## 2015-07-27 DIAGNOSIS — J189 Pneumonia, unspecified organism: Secondary | ICD-10-CM | POA: Diagnosis not present

## 2015-07-27 DIAGNOSIS — Z9114 Patient's other noncompliance with medication regimen: Secondary | ICD-10-CM | POA: Diagnosis not present

## 2015-07-27 DIAGNOSIS — M4854XA Collapsed vertebra, not elsewhere classified, thoracic region, initial encounter for fracture: Secondary | ICD-10-CM | POA: Diagnosis not present

## 2015-07-27 DIAGNOSIS — Z888 Allergy status to other drugs, medicaments and biological substances status: Secondary | ICD-10-CM | POA: Diagnosis not present

## 2015-07-27 DIAGNOSIS — R0789 Other chest pain: Secondary | ICD-10-CM | POA: Diagnosis not present

## 2015-07-27 DIAGNOSIS — S2220XA Unspecified fracture of sternum, initial encounter for closed fracture: Secondary | ICD-10-CM | POA: Diagnosis not present

## 2015-07-27 DIAGNOSIS — J9621 Acute and chronic respiratory failure with hypoxia: Secondary | ICD-10-CM | POA: Diagnosis not present

## 2015-07-27 DIAGNOSIS — E871 Hypo-osmolality and hyponatremia: Secondary | ICD-10-CM | POA: Insufficient documentation

## 2015-07-27 DIAGNOSIS — Z791 Long term (current) use of non-steroidal anti-inflammatories (NSAID): Secondary | ICD-10-CM | POA: Insufficient documentation

## 2015-07-27 DIAGNOSIS — Z90710 Acquired absence of both cervix and uterus: Secondary | ICD-10-CM | POA: Diagnosis not present

## 2015-07-27 DIAGNOSIS — J9811 Atelectasis: Secondary | ICD-10-CM | POA: Diagnosis not present

## 2015-07-27 DIAGNOSIS — I341 Nonrheumatic mitral (valve) prolapse: Secondary | ICD-10-CM | POA: Diagnosis not present

## 2015-07-27 DIAGNOSIS — R55 Syncope and collapse: Secondary | ICD-10-CM | POA: Diagnosis not present

## 2015-07-27 DIAGNOSIS — M810 Age-related osteoporosis without current pathological fracture: Secondary | ICD-10-CM | POA: Diagnosis not present

## 2015-07-27 DIAGNOSIS — R079 Chest pain, unspecified: Secondary | ICD-10-CM

## 2015-07-27 DIAGNOSIS — K449 Diaphragmatic hernia without obstruction or gangrene: Secondary | ICD-10-CM | POA: Diagnosis not present

## 2015-07-27 DIAGNOSIS — Z9841 Cataract extraction status, right eye: Secondary | ICD-10-CM | POA: Diagnosis not present

## 2015-07-27 DIAGNOSIS — K219 Gastro-esophageal reflux disease without esophagitis: Secondary | ICD-10-CM | POA: Diagnosis not present

## 2015-07-27 DIAGNOSIS — R0602 Shortness of breath: Secondary | ICD-10-CM | POA: Diagnosis present

## 2015-07-27 DIAGNOSIS — I480 Paroxysmal atrial fibrillation: Secondary | ICD-10-CM | POA: Diagnosis not present

## 2015-07-27 DIAGNOSIS — J9622 Acute and chronic respiratory failure with hypercapnia: Secondary | ICD-10-CM | POA: Diagnosis not present

## 2015-07-27 DIAGNOSIS — Z9842 Cataract extraction status, left eye: Secondary | ICD-10-CM | POA: Diagnosis not present

## 2015-07-27 DIAGNOSIS — Y9389 Activity, other specified: Secondary | ICD-10-CM | POA: Diagnosis not present

## 2015-07-27 DIAGNOSIS — E785 Hyperlipidemia, unspecified: Secondary | ICD-10-CM | POA: Diagnosis not present

## 2015-07-27 DIAGNOSIS — E1165 Type 2 diabetes mellitus with hyperglycemia: Secondary | ICD-10-CM | POA: Diagnosis not present

## 2015-07-27 DIAGNOSIS — R531 Weakness: Secondary | ICD-10-CM | POA: Diagnosis present

## 2015-07-27 DIAGNOSIS — I2510 Atherosclerotic heart disease of native coronary artery without angina pectoris: Secondary | ICD-10-CM | POA: Diagnosis not present

## 2015-07-27 DIAGNOSIS — R918 Other nonspecific abnormal finding of lung field: Secondary | ICD-10-CM | POA: Diagnosis not present

## 2015-07-27 DIAGNOSIS — R05 Cough: Secondary | ICD-10-CM | POA: Diagnosis not present

## 2015-07-27 DIAGNOSIS — D649 Anemia, unspecified: Secondary | ICD-10-CM | POA: Diagnosis not present

## 2015-07-27 DIAGNOSIS — Z8701 Personal history of pneumonia (recurrent): Secondary | ICD-10-CM | POA: Diagnosis not present

## 2015-07-27 DIAGNOSIS — Z9049 Acquired absence of other specified parts of digestive tract: Secondary | ICD-10-CM | POA: Diagnosis not present

## 2015-07-27 DIAGNOSIS — Z79899 Other long term (current) drug therapy: Secondary | ICD-10-CM | POA: Insufficient documentation

## 2015-07-27 DIAGNOSIS — E119 Type 2 diabetes mellitus without complications: Secondary | ICD-10-CM | POA: Insufficient documentation

## 2015-07-27 DIAGNOSIS — I700 Atherosclerosis of aorta: Secondary | ICD-10-CM | POA: Diagnosis not present

## 2015-07-27 DIAGNOSIS — M2550 Pain in unspecified joint: Secondary | ICD-10-CM | POA: Diagnosis not present

## 2015-07-27 DIAGNOSIS — Z8249 Family history of ischemic heart disease and other diseases of the circulatory system: Secondary | ICD-10-CM | POA: Diagnosis not present

## 2015-07-27 DIAGNOSIS — Z882 Allergy status to sulfonamides status: Secondary | ICD-10-CM | POA: Diagnosis not present

## 2015-07-27 DIAGNOSIS — Z792 Long term (current) use of antibiotics: Secondary | ICD-10-CM | POA: Insufficient documentation

## 2015-07-27 DIAGNOSIS — J90 Pleural effusion, not elsewhere classified: Secondary | ICD-10-CM | POA: Diagnosis not present

## 2015-07-27 DIAGNOSIS — J449 Chronic obstructive pulmonary disease, unspecified: Secondary | ICD-10-CM | POA: Diagnosis not present

## 2015-07-27 DIAGNOSIS — X58XXXA Exposure to other specified factors, initial encounter: Secondary | ICD-10-CM | POA: Diagnosis not present

## 2015-07-27 DIAGNOSIS — M3500 Sicca syndrome, unspecified: Secondary | ICD-10-CM | POA: Diagnosis not present

## 2015-07-27 DIAGNOSIS — Z87891 Personal history of nicotine dependence: Secondary | ICD-10-CM | POA: Insufficient documentation

## 2015-07-27 DIAGNOSIS — R109 Unspecified abdominal pain: Secondary | ICD-10-CM | POA: Diagnosis not present

## 2015-07-27 DIAGNOSIS — Z8673 Personal history of transient ischemic attack (TIA), and cerebral infarction without residual deficits: Secondary | ICD-10-CM | POA: Diagnosis not present

## 2015-07-27 DIAGNOSIS — Y9289 Other specified places as the place of occurrence of the external cause: Secondary | ICD-10-CM | POA: Diagnosis not present

## 2015-07-27 DIAGNOSIS — I10 Essential (primary) hypertension: Secondary | ICD-10-CM | POA: Insufficient documentation

## 2015-07-27 DIAGNOSIS — Z881 Allergy status to other antibiotic agents status: Secondary | ICD-10-CM | POA: Diagnosis not present

## 2015-07-27 DIAGNOSIS — Z7982 Long term (current) use of aspirin: Secondary | ICD-10-CM | POA: Diagnosis not present

## 2015-07-27 DIAGNOSIS — Z880 Allergy status to penicillin: Secondary | ICD-10-CM | POA: Insufficient documentation

## 2015-07-27 DIAGNOSIS — I5033 Acute on chronic diastolic (congestive) heart failure: Secondary | ICD-10-CM | POA: Diagnosis not present

## 2015-07-27 DIAGNOSIS — I739 Peripheral vascular disease, unspecified: Secondary | ICD-10-CM | POA: Diagnosis not present

## 2015-07-27 DIAGNOSIS — J159 Unspecified bacterial pneumonia: Secondary | ICD-10-CM | POA: Insufficient documentation

## 2015-07-27 DIAGNOSIS — I313 Pericardial effusion (noninflammatory): Secondary | ICD-10-CM | POA: Diagnosis not present

## 2015-07-27 DIAGNOSIS — G500 Trigeminal neuralgia: Secondary | ICD-10-CM | POA: Diagnosis not present

## 2015-07-27 DIAGNOSIS — Z7951 Long term (current) use of inhaled steroids: Secondary | ICD-10-CM | POA: Insufficient documentation

## 2015-07-27 LAB — CBC
HEMATOCRIT: 29.2 % — AB (ref 35.0–47.0)
HEMOGLOBIN: 9.8 g/dL — AB (ref 12.0–16.0)
MCH: 29.7 pg (ref 26.0–34.0)
MCHC: 33.5 g/dL (ref 32.0–36.0)
MCV: 88.6 fL (ref 80.0–100.0)
Platelets: 409 10*3/uL (ref 150–440)
RBC: 3.3 MIL/uL — AB (ref 3.80–5.20)
RDW: 18.1 % — ABNORMAL HIGH (ref 11.5–14.5)
WBC: 9.9 10*3/uL (ref 3.6–11.0)

## 2015-07-27 LAB — GLUCOSE, CAPILLARY: Glucose-Capillary: 201 mg/dL — ABNORMAL HIGH (ref 65–99)

## 2015-07-27 NOTE — ED Provider Notes (Signed)
[facility] Emergency Department Provider Note   ____________________________________________  Time seen: 2335  I have reviewed the triage vital signs and the nursing notes.   HISTORY  Chief Complaint Chest Pain   History limited by: Not Limited   HPI Catherine Holmes is a 81 y.o. female who presents to the emergency department today with concerns for chest pain. The patient was recently hospitalized at an outside facility for pneumonia, sepsis. She did undergo some chest compressions CPR however based on documentation it appears that they think most likely she had a vasovagal episode. She also has documentation stating that she suffered a sternal fracture. She was transported to a facility today to help with her cane by [employer] because. She states during transport she started having more chest pain.   Past Medical History  Diagnosis Date  . Essential hypertension   . Diabetes mellitus type II, controlled   . COPD (chronic obstructive pulmonary disease)   . Osteoporosis   . Carotid arterial disease     a. 2012 Carotid dopplers: 40-59% R ICA, 60-79% L ICA  . Tic douloureux   . Arthralgia   . Sjoegren syndrome   . TIA (transient ischemic attack)   . Emphysema   . MVP (mitral valve prolapse)   . Chronic diastolic CHF (congestive heart failure)     a. 08/2011 Echo: EF 65-70%, mild LVH, mod dil LA, mildly dil RA, mild MR, mild-mod AoV Sclerosis w/o stenosis, mod-sev elev PA pressures, mild TR.  Marland Kitchen Acid reflux   . Diverticulitis   . Anemia   . History of pneumonia   . Syncope and collapse   . Anemia   . Hip fracture, left   . Hyperlipidemia   . Hiatal hernia   . Chest pain     a. 08/2011 MV: EF 74%, no ischemia.  . Noncompliance     a. h/o not taking bumex as Rx.  Marland Kitchen PAF (paroxysmal atrial fibrillation)     a. CHA2DS2VASc = 8 ->No OAC 2/2 falls.    Patient Active Problem List   Diagnosis Date Noted  . Hyponatremia 04/11/2015  . Syncope  04/10/2015  . Absolute anemia   . Acute on chronic diastolic CHF (congestive heart failure) (HCC)   . CHF (congestive heart failure) (HCC) 03/31/2015  . Essential hypertension   . Diabetes mellitus type II, controlled (HCC)   . Hyposmolality and/or hyponatremia 12/04/2013  . Anemia 12/04/2013  . Chronic diastolic CHF (congestive heart failure) (HCC) 04/05/2013  . Positive fecal occult blood test 04/05/2013  . Fall 11/20/2012  . Chest tightness 07/13/2011  . Diabetes type 2, uncontrolled (HCC) 11/29/2009  . Hyperlipidemia 11/29/2009  . TIC DOULOUREUX 11/29/2009  . HYPERTENSION, BENIGN 11/29/2009  . MITRAL VALVE PROLAPSE 11/29/2009  . Congestive heart failure (HCC) 11/29/2009  . Carotid arterial disease (HCC) 11/29/2009  . TIA 11/29/2009  . PAD (peripheral artery disease) (HCC) 11/29/2009  . EMPHYSEMA 11/29/2009  . COPD (chronic obstructive pulmonary disease) (HCC) 11/29/2009  . GERD 11/29/2009  . SJOGREN'S SYNDROME 11/29/2009  . ARTHRALGIA 11/29/2009  . OSTEOPOROSIS 11/29/2009  . DYSPNEA 11/29/2009    Past Surgical History  Procedure Laterality Date  . Cholecystectomy  1965  . Abdominal hysterectomy  1964  . Cataract extraction Bilateral   . Hip fracture surgery  2014    left hip    Current Outpatient Rx  Name  Route  Sig  Dispense  Refill  . albuterol (PROAIR HFA) 108 (90 BASE) MCG/ACT inhaler  Inhalation   Inhale 2 puffs into the lungs every 6 (six) hours as needed.   3 Inhaler   0     For further refills please refill with your primar ...   . albuterol (PROVENTIL) (2.5 MG/3ML) 0.083% nebulizer solution   Nebulization   Take 2.5 mg by nebulization every 6 (six) hours as needed for wheezing or shortness of breath.          Marland Kitchen arformoterol (BROVANA) 15 MCG/2ML NEBU   Nebulization   Take 2 mLs (15 mcg total) by nebulization 2 (two) times daily.   120 mL   4   . aspirin EC 81 MG tablet   Oral   Take 81 mg by mouth daily.         . beta carotene  w/minerals (OCUVITE) tablet   Oral   Take 1 tablet by mouth 2 (two) times daily.         . bumetanide (BUMEX) 1 MG tablet   Oral   Take 1 tablet (1 mg total) by mouth daily. Take 1 tab daily Patient taking differently: Take 1 mg by mouth as needed. Take 1 tab daily   180 tablet   3   . calcium carbonate (OS-CAL) 600 MG TABS tablet   Oral   Take 600 mg by mouth daily with breakfast.         . carvedilol (COREG) 3.125 MG tablet   Oral   Take 3.125 mg by mouth 2 (two) times daily with a meal.         . Cholecalciferol (VITAMIN D-3 PO)   Oral   Take 1,000 Units by mouth.         . citalopram (CELEXA) 20 MG tablet   Oral   Take 20 mg by mouth daily.         . clindamycin (CLEOCIN) 300 MG capsule   Oral   Take 300 mg by mouth 4 (four) times daily.         . diclofenac sodium (VOLTAREN) 1 % GEL   Topical   Apply 1 g topically 4 (four) times daily.         . diphenhydramine-acetaminophen (TYLENOL PM) 25-500 MG TABS   Oral   Take 1 tablet by mouth at bedtime as needed.         . doxazosin (CARDURA) 4 MG tablet   Oral   Take 1 tablet (4 mg total) by mouth daily.         Marland Kitchen ezetimibe (ZETIA) 10 MG tablet   Oral   Take 5 mg by mouth daily.         Marland Kitchen gabapentin (NEURONTIN) 100 MG capsule   Oral   Take 100 mg by mouth 2 (two) times daily.          . hydrALAZINE (APRESOLINE) 50 MG tablet   Oral   Take 50 mg by mouth as needed.          Marland Kitchen HYDROcodone-acetaminophen (NORCO/VICODIN) 5-325 MG per tablet   Oral   Take 1 tablet by mouth as needed for severe pain.          Marland Kitchen lidocaine (LIDODERM) 5 %   Transdermal   Place 1 patch onto the skin every 12 (twelve) hours. Remove & Discard patch within 12 hours or as directed by MD         . mometasone-formoterol (DULERA) 100-5 MCG/ACT AERO   Inhalation   Inhale 2 puffs into the lungs 2 (  two) times daily.          . mupirocin ointment (BACTROBAN) 2 %   Topical   Apply topically.         .  potassium chloride (K-DUR,KLOR-CON) 10 MEQ tablet   Oral   Take 0.5 tablets (5 mEq total) by mouth 2 (two) times daily as needed. Patient takes this when she takes Bumex.   60 tablet   3   . spironolactone (ALDACTONE) 25 MG tablet   Oral   Take 1 tablet (25 mg total) by mouth daily.   90 tablet   3   . tiotropium (SPIRIVA) 18 MCG inhalation capsule   Inhalation   Place 1 capsule (18 mcg total) into inhaler and inhale daily.   90 capsule   0     For further refills please refill with your primar ...   . BETA CAROTENE PO   Oral   Take by mouth 2 (two) times daily.         . cephALEXin (KEFLEX) 500 MG capsule               . fluticasone (FLONASE) 50 MCG/ACT nasal spray   Each Nare   Place 1 spray into both nostrils daily.         . Multiple Vitamin (MULTIVITAMIN) tablet   Oral   Take 1 tablet by mouth daily.           . nebivolol (BYSTOLIC) 5 MG tablet   Oral   Take 1 tablet (5 mg total) by mouth daily. Patient not taking: Reported on 07/27/2015   30 tablet   0   . nitroGLYCERIN (NITROSTAT) 0.4 MG SL tablet   Sublingual   Place 1 tablet (0.4 mg total) under the tongue every 5 (five) minutes as needed for chest pain.   25 tablet   6   . triamcinolone (KENALOG) 0.025 % cream   Topical   Apply 1 application topically daily as needed (for arms).         . trimethoprim-polymyxin b (POLYTRIM) ophthalmic solution   Both Eyes   Place 1 drop into both eyes 2 (two) times daily.           Allergies Atorvastatin; Clindamycin; Codeine; Doxycycline; Epinephrine; Erythromycin; Fluticasone-salmeterol; Lasix; Levofloxacin; Penicillins; Propoxyphene hcl; Rosuvastatin; Simvastatin; Singlet; Sulfamethoxazole-trimethoprim; Teriparatide; Tetracyclines & related; Cefaclor; Glipizide; and Lisinopril  Family History  Problem Relation Age of Onset  . Heart attack Mother   . Heart attack Father   . Heart attack Sister     Social History Social History  Substance  Use Topics  . Smoking status: Former Smoker -- 0.50 packs/day for 30 years    Types: Cigarettes    Quit date: 10/22/1994  . Smokeless tobacco: Never Used  . Alcohol Use: Yes    Review of Systems  Constitutional: Negative for fever. Cardiovascular: Positive for chest pain. Respiratory: Negative for shortness of breath. Gastrointestinal: Negative for abdominal pain, vomiting and diarrhea. Genitourinary: Negative for dysuria. Musculoskeletal: Negative for back pain. Skin: Negative for rash. Neurological: Negative for headaches, focal weakness or numbness.   10-point ROS otherwise negative.  ____________________________________________   PHYSICAL EXAM:  VITAL SIGNS: ED Triage Vitals  Enc Vitals Group     BP 07/27/15 2217 174/76 mmHg     Pulse Rate 07/27/15 2217 75     Resp 07/27/15 2217 20     Temp 07/27/15 2217 97.7 F (36.5 C)     Temp Source 07/27/15 2217 Oral  SpO2 07/27/15 2217 93 %     Weight 07/27/15 2217 153 lb (69.4 kg)     Height 07/27/15 2217 5' (1.524 m)     Head Cir --      Peak Flow --      Pain Score 07/27/15 2220 10   Constitutional: Alert and oriented. Well appearing and in no distress. Eyes: Conjunctivae are normal. PERRL. Normal extraocular movements. ENT   Head: Normocephalic and atraumatic.   Nose: No congestion/rhinnorhea.   Mouth/Throat: Mucous membranes are moist.   Neck: No stridor. Hematological/Lymphatic/Immunilogical: No cervical lymphadenopathy. Cardiovascular: Normal rate, regular rhythm.  No murmurs, rubs, or gallops. There is tenderness to palpation of the chest wall. Respiratory: Normal respiratory effort without tachypnea nor retractions. Breath sounds are clear and equal bilaterally. No wheezes/rales/rhonchi. Gastrointestinal: Soft and nontender. No distention. There is no CVA tenderness. Genitourinary: Deferred Musculoskeletal: Normal range of motion in all extremities. No joint effusions.  No lower extremity  tenderness nor edema. Neurologic:  Normal speech and language. No gross focal neurologic deficits are appreciated. Speech is normal.  Skin:  Skin is warm, dry and intact. No rash noted. Psychiatric: Mood and affect are normal. Speech and behavior are normal. Patient exhibits appropriate insight and judgment.  ____________________________________________    LABS (pertinent positives/negatives)  Labs Reviewed  BASIC METABOLIC PANEL - Abnormal; Notable for the following:    Sodium 127 (*)    Chloride 84 (*)    CO2 37 (*)    Glucose, Bld 140 (*)    Calcium 8.5 (*)    All other components within normal limits  CBC - Abnormal; Notable for the following:    RBC 3.30 (*)    Hemoglobin 9.8 (*)    HCT 29.2 (*)    RDW 18.1 (*)    All other components within normal limits  TROPONIN I     ____________________________________________   EKG  I, Phineas Semen, attending physician, personally viewed and interpreted this EKG  EKG Time: 2214 Rate: 81 Rhythm: sinus rhythm, some PACs Axis: normal Intervals: qtc 455 QRS: q waves v1. narrow ST changes: no st elevation Impression: abnormal ekg   ____________________________________________    RADIOLOGY  CXR IMPRESSION: 1. New patchy right basilar airspace opacities raise concern for pneumonia. Asymmetric edema is considered less likely. Small right pleural effusion noted. Followup PA and lateral chest X-ray is recommended in 3-4 weeks following trial of antibiotic therapy to ensure resolution and exclude underlying malignancy. 2. Vascular congestion and cardiomegaly seen.  I, Bryson Gavia, personally viewed and evaluated these images (plain radiographs) as part of my medical decision making. ____________________________________________   PROCEDURES  Procedure(s) performed: None  Critical Care performed: No  ____________________________________________   INITIAL IMPRESSION / ASSESSMENT AND PLAN / ED  COURSE  Pertinent labs & imaging results that were available during my care of the patient were reviewed by me and considered in my medical decision making (see chart for details).  Patient presents to the emergency department today because of chest pain. Patient was recently discharged to SNF from outside hospital after being treated for pneumonia. Still on oral antibiotics. CXR here consistent with pneumonia. Blood work without leukocytosis, patient afebrile, think that she should continue oral antibiotics. Blood work without any other concerning symptoms. Will discharge back to snf.   ____________________________________________   FINAL CLINICAL IMPRESSION(S) / ED DIAGNOSES  Final diagnoses:  Community acquired pneumonia  Chest pain, unspecified chest pain type     Phineas Semen, MD 07/28/15 [phone]
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
## 2015-07-28 ENCOUNTER — Observation Stay
Admission: EM | Admit: 2015-07-28 | Discharge: 2015-07-29 | Disposition: A | Discharge status: Skilled Nursing Facility | Payer: Medicare Other | Attending: Internal Medicine | Admitting: Internal Medicine

## 2015-07-28 ENCOUNTER — Emergency Department: Payer: Medicare Other

## 2015-07-28 ENCOUNTER — Encounter: Payer: Self-pay | Admitting: Emergency Medicine

## 2015-07-28 DIAGNOSIS — Y9289 Other specified places as the place of occurrence of the external cause: Secondary | ICD-10-CM | POA: Insufficient documentation

## 2015-07-28 DIAGNOSIS — G8929 Other chronic pain: Secondary | ICD-10-CM | POA: Insufficient documentation

## 2015-07-28 DIAGNOSIS — I5033 Acute on chronic diastolic (congestive) heart failure: Secondary | ICD-10-CM | POA: Insufficient documentation

## 2015-07-28 DIAGNOSIS — I5030 Unspecified diastolic (congestive) heart failure: Secondary | ICD-10-CM | POA: Diagnosis not present

## 2015-07-28 DIAGNOSIS — I341 Nonrheumatic mitral (valve) prolapse: Secondary | ICD-10-CM | POA: Insufficient documentation

## 2015-07-28 DIAGNOSIS — Z7982 Long term (current) use of aspirin: Secondary | ICD-10-CM | POA: Insufficient documentation

## 2015-07-28 DIAGNOSIS — E1165 Type 2 diabetes mellitus with hyperglycemia: Secondary | ICD-10-CM | POA: Insufficient documentation

## 2015-07-28 DIAGNOSIS — Z881 Allergy status to other antibiotic agents status: Secondary | ICD-10-CM | POA: Insufficient documentation

## 2015-07-28 DIAGNOSIS — K449 Diaphragmatic hernia without obstruction or gangrene: Secondary | ICD-10-CM | POA: Insufficient documentation

## 2015-07-28 DIAGNOSIS — Z888 Allergy status to other drugs, medicaments and biological substances status: Secondary | ICD-10-CM | POA: Insufficient documentation

## 2015-07-28 DIAGNOSIS — I2510 Atherosclerotic heart disease of native coronary artery without angina pectoris: Secondary | ICD-10-CM | POA: Diagnosis not present

## 2015-07-28 DIAGNOSIS — D649 Anemia, unspecified: Secondary | ICD-10-CM | POA: Insufficient documentation

## 2015-07-28 DIAGNOSIS — Z9114 Patient's other noncompliance with medication regimen: Secondary | ICD-10-CM | POA: Insufficient documentation

## 2015-07-28 DIAGNOSIS — Z79899 Other long term (current) drug therapy: Secondary | ICD-10-CM | POA: Insufficient documentation

## 2015-07-28 DIAGNOSIS — S2220XA Unspecified fracture of sternum, initial encounter for closed fracture: Secondary | ICD-10-CM

## 2015-07-28 DIAGNOSIS — Z87891 Personal history of nicotine dependence: Secondary | ICD-10-CM | POA: Insufficient documentation

## 2015-07-28 DIAGNOSIS — E785 Hyperlipidemia, unspecified: Secondary | ICD-10-CM | POA: Insufficient documentation

## 2015-07-28 DIAGNOSIS — R0602 Shortness of breath: Secondary | ICD-10-CM | POA: Insufficient documentation

## 2015-07-28 DIAGNOSIS — J90 Pleural effusion, not elsewhere classified: Secondary | ICD-10-CM | POA: Insufficient documentation

## 2015-07-28 DIAGNOSIS — Z882 Allergy status to sulfonamides status: Secondary | ICD-10-CM | POA: Insufficient documentation

## 2015-07-28 DIAGNOSIS — G500 Trigeminal neuralgia: Secondary | ICD-10-CM | POA: Insufficient documentation

## 2015-07-28 DIAGNOSIS — R0789 Other chest pain: Secondary | ICD-10-CM

## 2015-07-28 DIAGNOSIS — M810 Age-related osteoporosis without current pathological fracture: Secondary | ICD-10-CM | POA: Insufficient documentation

## 2015-07-28 DIAGNOSIS — I700 Atherosclerosis of aorta: Secondary | ICD-10-CM | POA: Insufficient documentation

## 2015-07-28 DIAGNOSIS — I10 Essential (primary) hypertension: Secondary | ICD-10-CM | POA: Insufficient documentation

## 2015-07-28 DIAGNOSIS — Z66 Do not resuscitate: Secondary | ICD-10-CM | POA: Diagnosis not present

## 2015-07-28 DIAGNOSIS — M4854XA Collapsed vertebra, not elsewhere classified, thoracic region, initial encounter for fracture: Secondary | ICD-10-CM | POA: Insufficient documentation

## 2015-07-28 DIAGNOSIS — J189 Pneumonia, unspecified organism: Secondary | ICD-10-CM

## 2015-07-28 DIAGNOSIS — Z515 Encounter for palliative care: Secondary | ICD-10-CM | POA: Diagnosis not present

## 2015-07-28 DIAGNOSIS — Z7951 Long term (current) use of inhaled steroids: Secondary | ICD-10-CM | POA: Insufficient documentation

## 2015-07-28 DIAGNOSIS — X58XXXA Exposure to other specified factors, initial encounter: Secondary | ICD-10-CM | POA: Insufficient documentation

## 2015-07-28 DIAGNOSIS — R109 Unspecified abdominal pain: Secondary | ICD-10-CM | POA: Insufficient documentation

## 2015-07-28 DIAGNOSIS — K219 Gastro-esophageal reflux disease without esophagitis: Secondary | ICD-10-CM | POA: Insufficient documentation

## 2015-07-28 DIAGNOSIS — I739 Peripheral vascular disease, unspecified: Secondary | ICD-10-CM | POA: Insufficient documentation

## 2015-07-28 DIAGNOSIS — M2550 Pain in unspecified joint: Secondary | ICD-10-CM | POA: Insufficient documentation

## 2015-07-28 DIAGNOSIS — Z9841 Cataract extraction status, right eye: Secondary | ICD-10-CM | POA: Insufficient documentation

## 2015-07-28 DIAGNOSIS — R05 Cough: Secondary | ICD-10-CM | POA: Insufficient documentation

## 2015-07-28 DIAGNOSIS — Z880 Allergy status to penicillin: Secondary | ICD-10-CM | POA: Insufficient documentation

## 2015-07-28 DIAGNOSIS — J449 Chronic obstructive pulmonary disease, unspecified: Secondary | ICD-10-CM | POA: Insufficient documentation

## 2015-07-28 DIAGNOSIS — Z9049 Acquired absence of other specified parts of digestive tract: Secondary | ICD-10-CM | POA: Insufficient documentation

## 2015-07-28 DIAGNOSIS — J9622 Acute and chronic respiratory failure with hypercapnia: Secondary | ICD-10-CM | POA: Insufficient documentation

## 2015-07-28 DIAGNOSIS — E871 Hypo-osmolality and hyponatremia: Secondary | ICD-10-CM | POA: Insufficient documentation

## 2015-07-28 DIAGNOSIS — Z90710 Acquired absence of both cervix and uterus: Secondary | ICD-10-CM | POA: Insufficient documentation

## 2015-07-28 DIAGNOSIS — J9621 Acute and chronic respiratory failure with hypoxia: Principal | ICD-10-CM | POA: Diagnosis present

## 2015-07-28 DIAGNOSIS — Z8701 Personal history of pneumonia (recurrent): Secondary | ICD-10-CM | POA: Insufficient documentation

## 2015-07-28 DIAGNOSIS — R55 Syncope and collapse: Secondary | ICD-10-CM | POA: Insufficient documentation

## 2015-07-28 DIAGNOSIS — Z9842 Cataract extraction status, left eye: Secondary | ICD-10-CM | POA: Insufficient documentation

## 2015-07-28 DIAGNOSIS — I480 Paroxysmal atrial fibrillation: Secondary | ICD-10-CM | POA: Insufficient documentation

## 2015-07-28 DIAGNOSIS — R54 Age-related physical debility: Secondary | ICD-10-CM | POA: Diagnosis not present

## 2015-07-28 DIAGNOSIS — M3500 Sicca syndrome, unspecified: Secondary | ICD-10-CM | POA: Insufficient documentation

## 2015-07-28 DIAGNOSIS — R0600 Dyspnea, unspecified: Secondary | ICD-10-CM

## 2015-07-28 DIAGNOSIS — I313 Pericardial effusion (noninflammatory): Secondary | ICD-10-CM | POA: Insufficient documentation

## 2015-07-28 DIAGNOSIS — R918 Other nonspecific abnormal finding of lung field: Secondary | ICD-10-CM | POA: Insufficient documentation

## 2015-07-28 DIAGNOSIS — J9811 Atelectasis: Secondary | ICD-10-CM | POA: Insufficient documentation

## 2015-07-28 DIAGNOSIS — Z8673 Personal history of transient ischemic attack (TIA), and cerebral infarction without residual deficits: Secondary | ICD-10-CM | POA: Insufficient documentation

## 2015-07-28 DIAGNOSIS — Z8249 Family history of ischemic heart disease and other diseases of the circulatory system: Secondary | ICD-10-CM | POA: Insufficient documentation

## 2015-07-28 DIAGNOSIS — R531 Weakness: Secondary | ICD-10-CM | POA: Insufficient documentation

## 2015-07-28 DIAGNOSIS — Y9389 Activity, other specified: Secondary | ICD-10-CM | POA: Insufficient documentation

## 2015-07-28 LAB — BASIC METABOLIC PANEL
ANION GAP: 10 (ref 5–15)
Anion gap: 6 (ref 5–15)
BUN: 17 mg/dL (ref 6–20)
BUN: 15 mg/dL (ref 6–20)
CALCIUM: 8.5 mg/dL — AB (ref 8.9–10.3)
CHLORIDE: 84 mmol/L — AB (ref 101–111)
CHLORIDE: 83 mmol/L — AB (ref 101–111)
CO2: 37 mmol/L — ABNORMAL HIGH (ref 22–32)
CO2: 35 mmol/L — ABNORMAL HIGH (ref 22–32)
CREATININE: 0.55 mg/dL (ref 0.44–1.00)
Calcium: 8.9 mg/dL (ref 8.9–10.3)
Creatinine, Ser: 0.53 mg/dL (ref 0.44–1.00)
GFR calc Af Amer: >60 mL/min (ref >60)
GLUCOSE: 160 mg/dL — AB (ref 65–99)
Glucose, Bld: 140 mg/dL — ABNORMAL HIGH (ref 65–99)
POTASSIUM: 4.2 mmol/L (ref 3.5–5.1)
Potassium: 4.3 mmol/L (ref 3.5–5.1)
SODIUM: 127 mmol/L — AB (ref 135–145)
Sodium: 128 mmol/L — ABNORMAL LOW (ref 135–145)

## 2015-07-28 LAB — CBC WITH DIFFERENTIAL/PLATELET
BASOS ABS: 0 10*3/uL (ref 0–0.1)
EOS ABS: 0.1 10*3/uL (ref 0–0.7)
HCT: 31.5 % — ABNORMAL LOW (ref 35.0–47.0)
Hemoglobin: 10.3 g/dL — ABNORMAL LOW (ref 12.0–16.0)
Lymphocytes Relative: 10 %
Lymphs Abs: 0.9 10*3/uL — ABNORMAL LOW (ref 1.0–3.6)
MCH: 29.3 pg (ref 26.0–34.0)
MCHC: 32.6 g/dL (ref 32.0–36.0)
MCV: 89.8 fL (ref 80.0–100.0)
MONO ABS: 0.7 10*3/uL (ref 0.2–0.9)
NEUTROS ABS: 7.6 10*3/uL — AB (ref 1.4–6.5)
Neutrophils Relative %: 81 %
PLATELETS: 429 10*3/uL (ref 150–440)
RBC: 3.51 MIL/uL — ABNORMAL LOW (ref 3.80–5.20)
RDW: 18.3 % — AB (ref 11.5–14.5)
WBC: 9.3 10*3/uL (ref 3.6–11.0)

## 2015-07-28 LAB — COMPREHENSIVE METABOLIC PANEL
ALBUMIN: 2.9 g/dL — AB (ref 3.5–5.0)
ALT: 16 U/L (ref 14–54)
ANION GAP: 9 (ref 5–15)
AST: 21 U/L (ref 15–41)
Alkaline Phosphatase: 86 U/L (ref 38–126)
BILIRUBIN TOTAL: 0.4 mg/dL (ref 0.3–1.2)
BUN: 15 mg/dL (ref 6–20)
CHLORIDE: 83 mmol/L — AB (ref 101–111)
CO2: 37 mmol/L — ABNORMAL HIGH (ref 22–32)
Calcium: 9 mg/dL (ref 8.9–10.3)
Creatinine, Ser: 0.66 mg/dL (ref 0.44–1.00)
GFR calc Af Amer: >60 mL/min (ref >60)
GFR calc non Af Amer: >60 mL/min (ref >60)
GLUCOSE: 123 mg/dL — AB (ref 65–99)
POTASSIUM: 4.1 mmol/L (ref 3.5–5.1)
SODIUM: 129 mmol/L — AB (ref 135–145)
TOTAL PROTEIN: 5.8 g/dL — AB (ref 6.5–8.1)

## 2015-07-28 LAB — GLUCOSE, CAPILLARY
GLUCOSE-CAPILLARY: 143 mg/dL — AB (ref 65–99)
GLUCOSE-CAPILLARY: 156 mg/dL — AB (ref 65–99)
GLUCOSE-CAPILLARY: 131 mg/dL — AB (ref 65–99)

## 2015-07-28 LAB — CBC
HEMATOCRIT: 31.5 % — AB (ref 35.0–47.0)
HEMOGLOBIN: 10.5 g/dL — AB (ref 12.0–16.0)
MCH: 29.5 pg (ref 26.0–34.0)
MCHC: 33.3 g/dL (ref 32.0–36.0)
MCV: 88.8 fL (ref 80.0–100.0)
PLATELETS: 424 10*3/uL (ref 150–440)
RBC: 3.55 MIL/uL — ABNORMAL LOW (ref 3.80–5.20)
RDW: 18 % — ABNORMAL HIGH (ref 11.5–14.5)
WBC: 9.2 10*3/uL (ref 3.6–11.0)

## 2015-07-28 LAB — BRAIN NATRIURETIC PEPTIDE: B Natriuretic Peptide: 497 pg/mL — ABNORMAL HIGH (ref 0.0–100.0)

## 2015-07-28 LAB — TROPONIN I
TROPONIN I: 0.03 ng/mL (ref <0.031)
TROPONIN I: 0.03 ng/mL (ref <0.031)

## 2015-07-28 MED ORDER — LIDOCAINE 5 % EX PTCH
1.0000 | MEDICATED_PATCH | Freq: Two times a day (BID) | CUTANEOUS | Status: DC
  Administered 2015-07-29: 09:00:00 1 via TRANSDERMAL
  Filled 2015-07-28 (×3): qty 1

## 2015-07-28 MED ORDER — ARFORMOTEROL TARTRATE 15 MCG/2ML IN NEBU
15.0000 ug | INHALATION_SOLUTION | Freq: Two times a day (BID) | RESPIRATORY_TRACT | Status: DC
  Administered 2015-07-29: 08:00:00 15 ug via RESPIRATORY_TRACT
  Filled 2015-07-28 (×3): qty 2

## 2015-07-28 MED ORDER — ASPIRIN EC 81 MG PO TBEC
81.0000 mg | DELAYED_RELEASE_TABLET | Freq: Every day | ORAL | Status: DC
  Administered 2015-07-29: 08:00:00 81 mg via ORAL
  Filled 2015-07-28: qty 1

## 2015-07-28 MED ORDER — ONDANSETRON HCL 4 MG/2ML IJ SOLN: 4.0000 mg | Freq: Four times a day (QID) | INTRAMUSCULAR | Status: DC | PRN

## 2015-07-28 MED ORDER — CLINDAMYCIN HCL 300 MG PO CAPS
300.0000 mg | ORAL_CAPSULE | Freq: Four times a day (QID) | ORAL | Status: DC
  Administered 2015-07-29 (×4): 300 mg via ORAL
  Filled 2015-07-28 (×5): qty 1

## 2015-07-28 MED ORDER — OCUVITE PO TABS
1.0000 | ORAL_TABLET | Freq: Two times a day (BID) | ORAL | Status: DC
Start: 2015-07-28 — End: 2015-07-29
  Administered 2015-07-29: 1 via ORAL
  Filled 2015-07-28 (×3): qty 1

## 2015-07-28 MED ORDER — NITROGLYCERIN 0.4 MG SL SUBL: 0.4000 mg | SUBLINGUAL_TABLET | SUBLINGUAL | Status: DC | PRN

## 2015-07-28 MED ORDER — GABAPENTIN 100 MG PO CAPS
100.0000 mg | ORAL_CAPSULE | Freq: Two times a day (BID) | ORAL | Status: DC
  Administered 2015-07-29 (×2): 100 mg via ORAL
  Filled 2015-07-28 (×2): qty 1

## 2015-07-28 MED ORDER — CARVEDILOL 3.125 MG PO TABS
3.1250 mg | ORAL_TABLET | Freq: Two times a day (BID) | ORAL | Status: DC
  Administered 2015-07-29 (×2): 3.125 mg via ORAL
  Filled 2015-07-28 (×2): qty 1

## 2015-07-28 MED ORDER — CALCIUM CARBONATE 600 MG PO TABS: 600.0000 mg | ORAL_TABLET | Freq: Every day | ORAL | Status: DC

## 2015-07-28 MED ORDER — METHYLPREDNISOLONE SODIUM SUCC 125 MG IJ SOLR
60.0000 mg | INTRAMUSCULAR | Status: DC
  Administered 2015-07-29: 60 mg via INTRAVENOUS
  Filled 2015-07-28: qty 2

## 2015-07-28 MED ORDER — MOMETASONE FURO-FORMOTEROL FUM 100-5 MCG/ACT IN AERO: 2.0000 | INHALATION_SPRAY | Freq: Two times a day (BID) | RESPIRATORY_TRACT | Status: DC

## 2015-07-28 MED ORDER — ADULT MULTIVITAMIN W/MINERALS CH
1.0000 | ORAL_TABLET | Freq: Every day | ORAL | Status: DC
  Administered 2015-07-29: 1 via ORAL
  Filled 2015-07-28: qty 1

## 2015-07-28 MED ORDER — IPRATROPIUM-ALBUTEROL 0.5-2.5 (3) MG/3ML IN SOLN: 3.0000 mL | RESPIRATORY_TRACT | Status: DC | PRN

## 2015-07-28 MED ORDER — ACETAMINOPHEN 325 MG PO TABS
650.0000 mg | ORAL_TABLET | Freq: Four times a day (QID) | ORAL | Status: DC | PRN
  Administered 2015-07-29: 650 mg via ORAL
  Filled 2015-07-28: qty 2

## 2015-07-28 MED ORDER — DOXAZOSIN MESYLATE 1 MG PO TABS
4.0000 mg | ORAL_TABLET | Freq: Every day | ORAL | Status: DC
  Administered 2015-07-29: 4 mg via ORAL
  Filled 2015-07-28: qty 4

## 2015-07-28 MED ORDER — TIOTROPIUM BROMIDE MONOHYDRATE 18 MCG IN CAPS
18.0000 ug | ORAL_CAPSULE | Freq: Every day | RESPIRATORY_TRACT | Status: DC
  Administered 2015-07-29: 09:00:00 18 ug via RESPIRATORY_TRACT
  Filled 2015-07-28: qty 5

## 2015-07-28 MED ORDER — FLUTICASONE PROPIONATE 50 MCG/ACT NA SUSP
1.0000 | Freq: Every day | NASAL | Status: DC
  Administered 2015-07-29: 09:00:00 1 via NASAL
  Filled 2015-07-28: qty 16

## 2015-07-28 MED ORDER — ACETAMINOPHEN 325 MG PO TABS
ORAL_TABLET | ORAL | Status: AC
  Administered 2015-07-28: 650 mg via ORAL
  Filled 2015-07-28: qty 2

## 2015-07-28 MED ORDER — HEPARIN SODIUM (PORCINE) 5000 UNIT/ML IJ SOLN
5000.0000 [IU] | Freq: Three times a day (TID) | INTRAMUSCULAR | Status: DC
  Administered 2015-07-29 (×2): 5000 [IU] via SUBCUTANEOUS
  Filled 2015-07-28 (×2): qty 1

## 2015-07-28 MED ORDER — NEBIVOLOL HCL 5 MG PO TABS
5.0000 mg | ORAL_TABLET | Freq: Every day | ORAL | Status: DC
  Administered 2015-07-29: 5 mg via ORAL
  Filled 2015-07-28: qty 1

## 2015-07-28 MED ORDER — MORPHINE SULFATE (PF) 2 MG/ML IV SOLN
2.0000 mg | INTRAVENOUS | Status: DC | PRN
Start: 2015-07-28 — End: 2015-07-29
  Administered 2015-07-29 (×2): 2 mg via INTRAVENOUS
  Filled 2015-07-28 (×2): qty 1

## 2015-07-28 MED ORDER — CITALOPRAM HYDROBROMIDE 20 MG PO TABS
20.0000 mg | ORAL_TABLET | Freq: Every day | ORAL | Status: DC
  Administered 2015-07-29: 20 mg via ORAL
  Filled 2015-07-28: qty 1

## 2015-07-28 MED ORDER — ONDANSETRON HCL 4 MG PO TABS: 4.0000 mg | ORAL_TABLET | Freq: Four times a day (QID) | ORAL | Status: DC | PRN

## 2015-07-28 MED ORDER — ACETAMINOPHEN 325 MG PO TABS
650.0000 mg | ORAL_TABLET | Freq: Once | ORAL | Status: AC
  Administered 2015-07-28: 650 mg via ORAL

## 2015-07-28 MED ORDER — SPIRONOLACTONE 25 MG PO TABS
25.0000 mg | ORAL_TABLET | Freq: Every day | ORAL | Status: DC
  Administered 2015-07-29: 25 mg via ORAL
  Filled 2015-07-28: qty 1

## 2015-07-28 MED ORDER — OXYCODONE HCL 5 MG PO TABS
5.0000 mg | ORAL_TABLET | ORAL | Status: DC | PRN
  Administered 2015-07-29 (×3): 5 mg via ORAL
  Filled 2015-07-28 (×3): qty 1

## 2015-07-28 MED ORDER — EZETIMIBE 10 MG PO TABS
5.0000 mg | ORAL_TABLET | Freq: Every day | ORAL | Status: DC
  Administered 2015-07-29: 08:00:00 5 mg via ORAL
  Filled 2015-07-28: qty 1

## 2015-07-28 MED ORDER — ACETAMINOPHEN 650 MG RE SUPP: 650.0000 mg | Freq: Four times a day (QID) | RECTAL | Status: DC | PRN

## 2015-07-28 MED ORDER — CALCIUM CARBONATE ANTACID 500 MG PO CHEW
1.0000 | CHEWABLE_TABLET | Freq: Every day | ORAL | Status: DC
Start: 2015-07-29 — End: 2015-07-29
  Administered 2015-07-29: 08:00:00 200 mg via ORAL
  Filled 2015-07-28: qty 1

## 2015-07-28 NOTE — H&P (Signed)
[facility] [place] at [facility]   PATIENT NAME: Catherine Holmes    MR#:  [id number]  DATE OF BIRTH:  Sep 23, 1928   DATE OF ADMISSION:  07/28/2015  PRIMARY CARE PHYSICIAN: Marisue Ivan, MD   REQUESTING/REFERRING PHYSICIAN: McShane  CHIEF COMPLAINT:   Chief Complaint  Patient presents with  . Weakness   Shortness of breath/chest pain HISTORY OF PRESENT ILLNESS:  Catherine Holmes  is a 81 y.o. female with a known history of recent sternal fracture, currently being treated for pneumonia, COPD presenting with shortness of breath/chest pain She describes chest pain which occurs only with deep breathing retrosternal somewhat worse than prior despite lidocaine patch. She also states having some cough currently nonproductive however she attempts to minimize coughing secondary to pain. Denies fevers or chills. Positive shortness of breath stating "difficult because of pain" this is her second visit to the emergency department in 2 days. She describes the pain 9-10/10 rating from her chest to her entire body described only as "pain" worse with deep breathing and moving no relieving factors. On arrival to emergency department she was noted to be desaturating slightly placed on 4 L which is an increase from her 2 L nasal cannula  PAST MEDICAL HISTORY:   Past Medical History  Diagnosis Date  . Essential hypertension   . Diabetes mellitus type II, controlled (HCC)   . COPD (chronic obstructive pulmonary disease) (HCC)   . Osteoporosis   . Carotid arterial disease (HCC)     a. 2012 Carotid dopplers: 40-59% R ICA, 60-79% L ICA  . Tic douloureux   . Arthralgia   . Sjoegren syndrome (HCC)   . TIA (transient ischemic attack)   . Emphysema   . MVP (mitral valve prolapse)   . Chronic diastolic CHF (congestive heart failure) (HCC)     a. 08/2011 Echo: EF 65-70%, mild LVH, mod dil LA, mildly dil RA, mild MR, mild-mod AoV Sclerosis w/o stenosis, mod-sev elev PA  pressures, mild TR.  Marland Kitchen Acid reflux   . Diverticulitis   . Anemia   . History of pneumonia   . Syncope and collapse   . Anemia   . Hip fracture, left (HCC)   . Hyperlipidemia   . Hiatal hernia   . Chest pain     a. 08/2011 MV: EF 74%, no ischemia.  . Noncompliance     a. h/o not taking bumex as Rx.  Marland Kitchen PAF (paroxysmal atrial fibrillation) (HCC)     a. CHA2DS2VASc = 8 ->No OAC 2/2 falls.    PAST SURGICAL HISTORY:   Past Surgical History  Procedure Laterality Date  . Cholecystectomy  1965  . Abdominal hysterectomy  1964  . Cataract extraction Bilateral   . Hip fracture surgery  2014    left hip    SOCIAL HISTORY:   Social History  Substance Use Topics  . Smoking status: Former Smoker -- 0.50 packs/day for 30 years    Types: Cigarettes    Quit date: 10/22/1994  . Smokeless tobacco: Never Used  . Alcohol Use: Yes    FAMILY HISTORY:   Family History  Problem Relation Age of Onset  . Heart attack Mother   . Heart attack Father   . Heart attack Sister     DRUG ALLERGIES:   Allergies  Allergen Reactions  . Atorvastatin   . Clindamycin Diarrhea, Other (See Comments) and Nausea Only  . Codeine   . Doxycycline   . Epinephrine Hives and Itching  .  Erythromycin   . Fluticasone-Salmeterol   . Lasix [Furosemide] Itching  . Levofloxacin   . Penicillins   . Propoxyphene Hcl   . Rosuvastatin   . Simvastatin   . Singlet [Chlorphen-Pe-Acetaminophen] Other (See Comments)    Reaction: Wheezing  . Sulfamethoxazole-Trimethoprim   . Teriparatide   . Tetracyclines & Related Other (See Comments)    unknown  . Cefaclor Rash and Other (See Comments)    Reaction: Delirious    . Glipizide Hives  . Lisinopril Hives and Itching    REVIEW OF SYSTEMS:  REVIEW OF SYSTEMS:  CONSTITUTIONAL: Denies fevers, chills, positive fatigue, weakness.  EYES: Denies blurred vision, double vision, or eye pain.  EARS, NOSE, THROAT: Denies tinnitus, ear pain, hearing loss.  RESPIRATORY:  Positive cough, shortness of breath, denies wheezing  CARDIOVASCULAR: Positive chest pain, denies palpitations, edema.  GASTROINTESTINAL: Denies nausea, vomiting, diarrhea, abdominal pain.  GENITOURINARY: Denies dysuria, hematuria.  ENDOCRINE: Denies nocturia or thyroid problems. HEMATOLOGIC AND LYMPHATIC: Denies easy bruising or bleeding.  SKIN: Denies rash or lesions.  MUSCULOSKELETAL: Denies pain in neck, back, shoulder, knees, hips, or further arthritic symptoms.  NEUROLOGIC: Denies paralysis, paresthesias.  PSYCHIATRIC: Denies anxiety or depressive symptoms. Otherwise full review of systems performed by me is negative.   MEDICATIONS AT HOME:   Prior to Admission medications   Medication Sig Start Date End Date Taking? Authorizing Provider  albuterol (PROAIR HFA) 108 (90 BASE) MCG/ACT inhaler Inhale 2 puffs into the lungs every 6 (six) hours as needed. 01/24/12  Yes Antonieta Iba, MD  citalopram (CELEXA) 20 MG tablet Take 20 mg by mouth daily.   Yes Historical Provider, MD  clindamycin (CLEOCIN) 300 MG capsule Take 300 mg by mouth 4 (four) times daily.   Yes Historical Provider, MD  ezetimibe (ZETIA) 10 MG tablet Take 5 mg by mouth daily.   Yes Historical Provider, MD  gabapentin (NEURONTIN) 100 MG capsule Take 100 mg by mouth 2 (two) times daily.    Yes Historical Provider, MD  HYDROcodone-acetaminophen (NORCO/VICODIN) 5-325 MG tablet Take 1-2 tablets by mouth 2 (two) times daily as needed for moderate pain.   Yes Historical Provider, MD  mupirocin ointment (BACTROBAN) 2 % Apply topically.   Yes Historical Provider, MD  nitroGLYCERIN (NITROSTAT) 0.4 MG SL tablet Place 1 tablet (0.4 mg total) under the tongue every 5 (five) minutes as needed for chest pain. 07/08/14 01/07/16 Yes Antonieta Iba, MD  potassium chloride (K-DUR,KLOR-CON) 10 MEQ tablet Take 0.5 tablets (5 mEq total) by mouth 2 (two) times daily as needed. Patient takes this when she takes Bumex. 06/09/15  Yes Antonieta Iba, MD  spironolactone (ALDACTONE) 25 MG tablet Take 1 tablet (25 mg total) by mouth daily. 05/31/15  Yes Antonieta Iba, MD  albuterol (PROVENTIL) (2.5 MG/3ML) 0.083% nebulizer solution Take 2.5 mg by nebulization every 6 (six) hours as needed for wheezing or shortness of breath.     Historical Provider, MD  arformoterol (BROVANA) 15 MCG/2ML NEBU Take 2 mLs (15 mcg total) by nebulization 2 (two) times daily. 12/26/11   Lupita Leash, MD  aspirin EC 81 MG tablet Take 81 mg by mouth daily.    Historical Provider, MD  beta carotene w/minerals (OCUVITE) tablet Take 1 tablet by mouth 2 (two) times daily.    Historical Provider, MD  bumetanide (BUMEX) 1 MG tablet Take 1 tablet (1 mg total) by mouth daily. Take 1 tab daily Patient taking differently: Take 1 mg by mouth as needed. Take  1 tab daily 04/03/15   Adrian Saran, MD  calcium carbonate (OS-CAL) 600 MG TABS tablet Take 600 mg by mouth daily with breakfast.    Historical Provider, MD  carvedilol (COREG) 3.125 MG tablet Take 3.125 mg by mouth 2 (two) times daily with a meal.    Historical Provider, MD  cephALEXin (KEFLEX) 500 MG capsule  06/15/15   Historical Provider, MD  Cholecalciferol (VITAMIN D-3 PO) Take 1,000 Units by mouth.    Historical Provider, MD  diphenhydramine-acetaminophen (TYLENOL PM) 25-500 MG TABS Take 1 tablet by mouth at bedtime as needed.    Historical Provider, MD  doxazosin (CARDURA) 4 MG tablet Take 1 tablet (4 mg total) by mouth daily. 05/31/15   Antonieta Iba, MD  fluticasone (FLONASE) 50 MCG/ACT nasal spray Place 1 spray into both nostrils daily.    Historical Provider, MD  hydrALAZINE (APRESOLINE) 50 MG tablet Take 50 mg by mouth as needed.  03/01/15   Historical Provider, MD  lidocaine (LIDODERM) 5 % Place 1 patch onto the skin every 12 (twelve) hours. Remove & Discard patch within 12 hours or as directed by MD    Historical Provider, MD  mometasone-formoterol (DULERA) 100-5 MCG/ACT AERO Inhale 2 puffs into the lungs 2  (two) times daily.     Historical Provider, MD  Multiple Vitamin (MULTIVITAMIN) tablet Take 1 tablet by mouth daily.      Historical Provider, MD  nebivolol (BYSTOLIC) 5 MG tablet Take 1 tablet (5 mg total) by mouth daily. Patient not taking: Reported on 07/27/2015 04/03/15   Adrian Saran, MD  tiotropium (SPIRIVA) 18 MCG inhalation capsule Place 1 capsule (18 mcg total) into inhaler and inhale daily. 01/24/12   Antonieta Iba, MD      VITAL SIGNS:  Blood pressure 142/65, pulse 76, temperature 98.3 F (36.8 C), temperature source Oral, resp. rate 20, height  (1.651 m), weight 170 lb (77.111 kg), SpO2 96 %.  PHYSICAL EXAMINATION:  VITAL SIGNS: Filed Vitals:   07/28/15 2200  BP: 142/65  Pulse: 76  Temp:   Resp: 20   GENERAL:81 y.o.female currently in no acute distress.  HEAD: Normocephalic, atraumatic.  EYES: Pupils equal, round, reactive to light. Extraocular muscles intact. No scleral icterus.  MOUTH: Moist mucosal membrane. Dentition intact. No abscess noted.  EAR, NOSE, THROAT: Clear without exudates. No external lesions.  NECK: Supple. No thyromegaly. No nodules. No JVD.  PULMONARY: Diminished breath sounds throughout all lung fields without wheeze . No use of accessory muscles, poor respiratory effort. Poor air entry bilaterally CHEST: Nontender to palpation.  CARDIOVASCULAR: S1 and S2. Irregular rate and rhythm. No murmurs, rubs, or gallops. No edema. Pedal pulses 2+ bilaterally.  GASTROINTESTINAL: Soft, nontender, nondistended. No masses. Positive bowel sounds. No hepatosplenomegaly.  MUSCULOSKELETAL: No swelling, clubbing, or edema. Range of motion full in all extremities.  NEUROLOGIC: Cranial nerves II through XII are intact. No gross focal neurological deficits. Sensation intact. Reflexes intact.  SKIN: No ulceration, lesions, rashes, or cyanosis. Skin warm and dry. Turgor intact.  PSYCHIATRIC: Mood, affect within normal limits. The patient is awake, alert and oriented x 3.  Insight, judgment intact.    LABORATORY PANEL:   CBC  Recent Labs Lab 07/28/15 1945  WBC 9.2  HGB 10.5*  HCT 31.5*  PLT 424   ------------------------------------------------------------------------------------------------------------------  Chemistries   Recent Labs Lab 07/28/15 1608 07/28/15 1945  NA 129* 128*  K 4.1 4.2  CL 83* 83*  CO2 37* 35*  GLUCOSE 123* 160*  BUN 15 15  CREATININE 0.66 0.53  CALCIUM 9.0 8.9  AST 21  --   ALT 16  --   ALKPHOS 86  --   BILITOT 0.4  --    ------------------------------------------------------------------------------------------------------------------  Cardiac Enzymes  Recent Labs Lab 07/28/15 1608  TROPONINI 0.03   ------------------------------------------------------------------------------------------------------------------  RADIOLOGY:  Dg Chest 2 View  07/27/2015   CLINICAL DATA:  Acute onset of generalized chest pain. Initial encounter.  EXAM: CHEST  2 VIEW  COMPARISON:  Chest radiograph performed 05/22/2015  FINDINGS: The lungs are well-aerated. New patchy right basilar airspace opacities raise concern for pneumonia. Asymmetric pulmonary edema is considered less likely. A small right pleural effusion is noted. No pneumothorax is seen.  Underlying vascular congestion is noted. The heart is enlarged. No acute osseous abnormalities are seen.  IMPRESSION: 1. New patchy right basilar airspace opacities raise concern for pneumonia. Asymmetric edema is considered less likely. Small right pleural effusion noted. Followup PA and lateral chest X-ray is recommended in 3-4 weeks following trial of antibiotic therapy to ensure resolution and exclude underlying malignancy. 2. Vascular congestion and cardiomegaly seen.   Electronically Signed   By: Roanna Raider M.D.   On: 07/27/2015 23:23   Dg Chest Port 1 View  07/28/2015   CLINICAL DATA:  Weakness and shortness of Breath  EXAM: PORTABLE CHEST - 1 VIEW  COMPARISON:  07/27/2015   FINDINGS: Cardiac shadow is enlarged but stable. The lungs are well aerated bilaterally and again demonstrates some mild patchy changes in the right lung base with small effusion. Left lung remains clear. Healed proximal right humeral fracture  IMPRESSION: Stable right basilar changes   Electronically Signed   By: Alcide Clever M.D.   On: 07/28/2015 20:50    EKG:   Orders placed or performed during the hospital encounter of 07/28/15  . ED EKG  . ED EKG    IMPRESSION AND PLAN:   81 year old Caucasian female history of COPD 2 L nasal cannula presenting with shortness of breath  1. Acute on chronic hypoxic respiratory failure: Indicated by increased oxygen demand as well as increased respiratory rate upon arrival: This is likely combination of pain secondary to cervical fracture, atelectasis, pneumonia. Continue on home dosages of clindamycin for antibiotics coverage as infection does not appear to be worsening no leukocytosis no fever. It seems that her major issue today she secondary to pain control provide better pain control add breathing treatments and incentive spirometry to hopefully avoid further atelectasis  2. Hyponatremia: Relatively baseline given diuretic usage with congestive heart failure continue to monitor however would avoid further IV fluid hydration 3. Chronic diastolic congestive heart failure: Continue with Coreg, spironolactone, diuretic usage 4. COPD without acute exacerbation: Continue home regiment with the additions as stated above 5. Venous thromboembolism prophylactic: Heparin subcutaneous    All the records are reviewed and case discussed with ED provider. Management plans discussed with the patient, family and they are in agreement.  CODE STATUS: DO NOT RESUSCITATE  TOTAL TIME TAKING CARE OF THIS PATIENT: 45 minutes.    Hower,  Mardi Mainland.D on 07/28/2015 at 11:04 PM  Between 7am to 6pm - Pager - [phone]  After 6pm: House Pager: - [phone]  [facility]  Office  [phone]  CC: Primary care physician; Marisue Ivan, MD

## 2015-07-28 NOTE — ED Notes (Signed)
Pt brought in via ems from [facility].  Pt seen in ER last night.  Pt brought back for an eval of weakness.
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
## 2015-07-28 NOTE — Discharge Instructions (Signed)
Please continue the antibiotic prescribed for your pneumonia. Please seek medical attention for any high fevers, chest pain, shortness of breath, change in behavior, persistent vomiting, bloody stool or any other new or concerning symptoms.   Chest Wall Pain Chest wall pain is pain in or around the bones and muscles of your chest. Sometimes, an injury causes this pain. Sometimes, the cause may not be known. This pain may take several weeks or longer to get better. HOME CARE INSTRUCTIONS  Pay attention to any changes in your symptoms. Take these actions to help with your pain:   Rest as told by your health care provider.   Avoid activities that cause pain. These include any activities that use your chest muscles or your abdominal and side muscles to lift heavy items.   If directed, apply ice to the painful area:  Put ice in a plastic bag.  Place a towel between your skin and the bag.  Leave the ice on for 20 minutes, 2-3 times per day.  Take over-the-counter and prescription medicines only as told by your health care provider.  Do not use tobacco products, including cigarettes, chewing tobacco, and e-cigarettes. If you need help quitting, ask your health care provider.  Keep all follow-up visits as told by your health care provider. This is important. SEEK MEDICAL CARE IF:  You have a fever.  Your chest pain becomes worse.  You have new symptoms. SEEK IMMEDIATE MEDICAL CARE IF:  You have nausea or vomiting.  You feel sweaty or light-headed.  You have a cough with phlegm (sputum) or you cough up blood.  You develop shortness of breath.   This information is not intended to replace advice given to you by your health care provider. Make sure you discuss any questions you have with your health care provider.   Document Released: 10/08/2005 Document Revised: 06/29/2015 Document Reviewed: 01/03/2015 Elsevier Interactive Patient Education [employer]

## 2015-07-28 NOTE — ED Notes (Signed)
Patient turned, linen changed, skin care given.

## 2015-07-29 ENCOUNTER — Observation Stay: Payer: Medicare Other

## 2015-07-29 DIAGNOSIS — S2220XA Unspecified fracture of sternum, initial encounter for closed fracture: Secondary | ICD-10-CM

## 2015-07-29 DIAGNOSIS — J189 Pneumonia, unspecified organism: Secondary | ICD-10-CM

## 2015-07-29 DIAGNOSIS — J9621 Acute and chronic respiratory failure with hypoxia: Secondary | ICD-10-CM | POA: Diagnosis not present

## 2015-07-29 DIAGNOSIS — R0789 Other chest pain: Secondary | ICD-10-CM

## 2015-07-29 LAB — CBC
HEMATOCRIT: 30.2 % — AB (ref 35.0–47.0)
HEMATOCRIT: 30.3 % — AB (ref 35.0–47.0)
HEMOGLOBIN: 9.8 g/dL — AB (ref 12.0–16.0)
Hemoglobin: 9.6 g/dL — ABNORMAL LOW (ref 12.0–16.0)
MCH: 28.3 pg (ref 26.0–34.0)
MCH: 28.8 pg (ref 26.0–34.0)
MCHC: 32 g/dL (ref 32.0–36.0)
MCHC: 32.4 g/dL (ref 32.0–36.0)
MCV: 88.8 fL (ref 80.0–100.0)
MCV: 88.7 fL (ref 80.0–100.0)
Platelets: 417 10*3/uL (ref 150–440)
Platelets: 420 10*3/uL (ref 150–440)
RBC: 3.4 MIL/uL — ABNORMAL LOW (ref 3.80–5.20)
RBC: 3.41 MIL/uL — ABNORMAL LOW (ref 3.80–5.20)
RDW: 18.1 % — ABNORMAL HIGH (ref 11.5–14.5)
RDW: 18 % — AB (ref 11.5–14.5)
WBC: 11.9 10*3/uL — AB (ref 3.6–11.0)
WBC: 9.4 10*3/uL (ref 3.6–11.0)

## 2015-07-29 LAB — FIBRIN DERIVATIVES D-DIMER (ARMC ONLY): Fibrin derivatives D-dimer (ARMC): 2279 — ABNORMAL HIGH (ref 0–499)

## 2015-07-29 LAB — URINALYSIS COMPLETE WITH MICROSCOPIC (ARMC ONLY)
BILIRUBIN URINE: NEGATIVE
Bacteria, UA: NONE SEEN
Glucose, UA: NEGATIVE mg/dL
Hgb urine dipstick: NEGATIVE
KETONES UR: NEGATIVE mg/dL
LEUKOCYTES UA: NEGATIVE
NITRITE: NEGATIVE
PH: 8 (ref 5.0–8.0)
Protein, ur: NEGATIVE mg/dL
Specific Gravity, Urine: 1.013 (ref 1.005–1.030)

## 2015-07-29 LAB — BASIC METABOLIC PANEL
ANION GAP: 8 (ref 5–15)
BUN: 15 mg/dL (ref 6–20)
CO2: 34 mmol/L — ABNORMAL HIGH (ref 22–32)
Calcium: 9 mg/dL (ref 8.9–10.3)
Chloride: 87 mmol/L — ABNORMAL LOW (ref 101–111)
Creatinine, Ser: 0.64 mg/dL (ref 0.44–1.00)
GFR calc Af Amer: >60 mL/min (ref >60)
GLUCOSE: 193 mg/dL — AB (ref 65–99)
POTASSIUM: 4.8 mmol/L (ref 3.5–5.1)
Sodium: 129 mmol/L — ABNORMAL LOW (ref 135–145)

## 2015-07-29 LAB — CREATININE, SERUM
Creatinine, Ser: 0.62 mg/dL (ref 0.44–1.00)
GFR calc non Af Amer: >60 mL/min (ref >60)

## 2015-07-29 MED ORDER — METHYLPREDNISOLONE 4 MG PO TBPK: ORAL_TABLET | ORAL | Status: DC

## 2015-07-29 MED ORDER — SODIUM CHLORIDE 0.9 % IV SOLN
INTRAVENOUS | Status: DC
  Administered 2015-07-29: 12:00:00 via INTRAVENOUS

## 2015-07-29 MED ORDER — IOHEXOL 350 MG/ML SOLN
80.0000 mL | Freq: Once | INTRAVENOUS | Status: AC | PRN
  Administered 2015-07-29: 80 mL via INTRAVENOUS

## 2015-07-29 MED ORDER — OXYCODONE HCL 5 MG PO TABS: 5.0000 mg | ORAL_TABLET | ORAL | Status: DC | PRN

## 2015-07-29 MED ORDER — INFLUENZA VAC SPLIT QUAD 0.5 ML IM SUSY
0.5000 mL | PREFILLED_SYRINGE | INTRAMUSCULAR | Status: DC
Start: 2015-07-30 — End: 2015-07-29

## 2015-07-29 NOTE — Progress Notes (Signed)
Per MD, okay for pt to d/c to snf, telephone order.

## 2015-07-29 NOTE — Discharge Summary (Addendum)
[facility] - [place] at [facility]   PATIENT NAME: Catherine Holmes    MR#:  [id number]  DATE OF BIRTH:  [birth year]  DATE OF ADMISSION:  07/28/2015 ADMITTING PHYSICIAN: Wyatt Haste, MD  DATE OF DISCHARGE: No discharge date for patient encounter.  PRIMARY CARE PHYSICIAN: Marisue Ivan, MD     ADMISSION DIAGNOSIS:  Weakness [R53.1] SOB (shortness of breath) [R06.02]  DISCHARGE DIAGNOSIS:  Principal Problem:   Acute on chronic respiratory failure with hypoxia and hypercapnia (HCC) Active Problems:   Musculoskeletal chest pain   Sternal fracture   Pneumonia   SECONDARY DIAGNOSIS:   Past Medical History  Diagnosis Date  . Essential hypertension   . Diabetes mellitus type II, controlled (HCC)   . COPD (chronic obstructive pulmonary disease) (HCC)   . Osteoporosis   . Carotid arterial disease (HCC)     a. 2012 Carotid dopplers: 40-59% R ICA, 60-79% L ICA  . Tic douloureux   . Arthralgia   . Sjoegren syndrome (HCC)   . TIA (transient ischemic attack)   . Emphysema   . MVP (mitral valve prolapse)   . Chronic diastolic CHF (congestive heart failure) (HCC)     a. 08/2011 Echo: EF 65-70%, mild LVH, mod dil LA, mildly dil RA, mild MR, mild-mod AoV Sclerosis w/o stenosis, mod-sev elev PA pressures, mild TR.  Marland Kitchen Acid reflux   . Diverticulitis   . Anemia   . History of pneumonia   . Syncope and collapse   . Anemia   . Hip fracture, left (HCC)   . Hyperlipidemia   . Hiatal hernia   . Chest pain     a. 08/2011 MV: EF 74%, no ischemia.  . Noncompliance     a. h/o not taking bumex as Rx.  Marland Kitchen PAF (paroxysmal atrial fibrillation) (HCC)     a. CHA2DS2VASc = 8 ->No OAC 2/2 falls.    .pro HOSPITAL COURSE:  The patient is a 81 year old Caucasian female who presented to the hospital to emergency room with chest pain, upper division was hospitalized at an outside facility for pneumonia and sepsis. He she underwent the chest compressions CPR for what that  looks like a vasovagal episode and had a sternal fracture. Now she presented to the hospital with worsening oxygenation requiring 4 L of oxygen through nasal cannula at from her 2 L at baseline. She admitted of splinting and having significant chest pains, especially on the right side of the chest with movements breathing, coughing. Chest x-ray reveals stable right basilar changes with small right effusion. Patient's labs were remarkable follow sodium level of 129, mild anemia with hemoglobin level of 10.5. Patient's urinalysis was unremarkable . Patient was admitted to the hospital for pain control. Patient's d-dimer was done, was found to be above 2000 and CT scan of the chest is ordered to rule out pulmonary embolism. CT scan of the chest showed no pulmonary embolism. However, is based consolidation in both lung bases, more on the right than on the left with small right pleural effusion was noted, as well as stable collapse of T8 and T12 vertebral bodies Discussion by problem 1. Acute on chronic respiratory failure with hypoxia and hypercapnia, continue oxygen therapy, keeping her pulse oximeter is 86-92%, CT scan of the chest revealed pneumonia and T8, T12 vertebral body collapse, which could have contributed to pain with breathing and hypoxia. Patient is to continue steroids taper as well as antibiotic therapy for pneumonia, no change in antibiotic therapy was  made today while she was in the hospital . It is our assumption that the patient's antibiotic therapy was based on culture results, deferring adjustment of therapy to primary physician ,  adding Humibid, continue nebulizer therapy. Pain management will likely improve patient's oxygenation as well as hypercapnia. Watch closely for mental status change with oxycodone 2. Hyponatremia chronically abnormal sodium levels, not much different from before,  patient is on diuretics ,  she is asymptomatic, follow levels as outpatient. Would benefit from checking  TSH, as well as cortisol level, osmolarities of urine and serum to establish diagnosis of SIADH, would benefit from a fluid restriction, watching for dehydration 3. Chronic diastolic CHF symptoms stable to resume outpatient medications 4. COPD, stable as well. Continue steroid taper due to recent diagnosis of pneumonia, continue inhalation therapy, oxygen, keeping pulse oximeter at around 86-92%, patient's bicarbonate level seemed to be better today than it was yesterday with incentive spirometry, which patient would benefit from greatly.  5. Musculoskeletal chest pain. Continue patient on oxycodone, watching her mental status closely. Continue incentive spirometry. We feel that patient is stable to be discharged to skilled nursing facility for close supervision DISCHARGE CONDITIONS:   stable  CONSULTS OBTAINED:     DRUG ALLERGIES:   Allergies  Allergen Reactions  . Atorvastatin   . Clindamycin Diarrhea, Other (See Comments) and Nausea Only  . Codeine   . Doxycycline   . Epinephrine Hives and Itching  . Erythromycin   . Fluticasone-Salmeterol   . Lasix [Furosemide] Itching  . Levofloxacin   . Penicillins   . Propoxyphene Hcl   . Rosuvastatin   . Simvastatin   . Singlet [Chlorphen-Pe-Acetaminophen] Other (See Comments)    Reaction: Wheezing  . Sulfamethoxazole-Trimethoprim   . Teriparatide   . Tetracyclines & Related Other (See Comments)    unknown  . Cefaclor Rash and Other (See Comments)    Reaction: Delirious    . Glipizide Hives  . Lisinopril Hives and Itching    DISCHARGE MEDICATIONS:   Current Discharge Medication List    START taking these medications   Details  methylPREDNISolone (MEDROL DOSEPAK) 4 MG TBPK tablet follow package directions Qty: 21 tablet, Refills: 0    oxyCODONE (OXY IR/ROXICODONE) 5 MG immediate release tablet Take 1 tablet (5 mg total) by mouth every 4 (four) hours as needed for moderate pain. Qty: 30 tablet, Refills: 0      CONTINUE  these medications which have NOT CHANGED   Details  albuterol (PROAIR HFA) 108 (90 BASE) MCG/ACT inhaler Inhale 2 puffs into the lungs every 6 (six) hours as needed. Qty: 3 Inhaler, Refills: 0    citalopram (CELEXA) 20 MG tablet Take 20 mg by mouth daily.    clindamycin (CLEOCIN) 300 MG capsule Take 300 mg by mouth 4 (four) times daily.    ezetimibe (ZETIA) 10 MG tablet Take 5 mg by mouth daily.    gabapentin (NEURONTIN) 100 MG capsule Take 100 mg by mouth 2 (two) times daily.     HYDROcodone-acetaminophen (NORCO/VICODIN) 5-325 MG tablet Take 1-2 tablets by mouth 2 (two) times daily as needed for moderate pain.    mupirocin ointment (BACTROBAN) 2 % Apply topically.    nitroGLYCERIN (NITROSTAT) 0.4 MG SL tablet Place 1 tablet (0.4 mg total) under the tongue every 5 (five) minutes as needed for chest pain. Qty: 25 tablet, Refills: 6    potassium chloride (K-DUR,KLOR-CON) 10 MEQ tablet Take 0.5 tablets (5 mEq total) by mouth 2 (two) times daily as needed.  Patient takes this when she takes Bumex. Qty: 60 tablet, Refills: 3    spironolactone (ALDACTONE) 25 MG tablet Take 1 tablet (25 mg total) by mouth daily. Qty: 90 tablet, Refills: 3    albuterol (PROVENTIL) (2.5 MG/3ML) 0.083% nebulizer solution Take 2.5 mg by nebulization every 6 (six) hours as needed for wheezing or shortness of breath.     arformoterol (BROVANA) 15 MCG/2ML NEBU Take 2 mLs (15 mcg total) by nebulization 2 (two) times daily. Qty: 120 mL, Refills: 4    aspirin EC 81 MG tablet Take 81 mg by mouth daily.    beta carotene w/minerals (OCUVITE) tablet Take 1 tablet by mouth 2 (two) times daily.    bumetanide (BUMEX) 1 MG tablet Take 1 tablet (1 mg total) by mouth daily. Take 1 tab daily Qty: 180 tablet, Refills: 3    calcium carbonate (OS-CAL) 600 MG TABS tablet Take 600 mg by mouth daily with breakfast.    carvedilol (COREG) 3.125 MG tablet Take 3.125 mg by mouth 2 (two) times daily with a meal.    Cholecalciferol  (VITAMIN D-3 PO) Take 1,000 Units by mouth.    diphenhydramine-acetaminophen (TYLENOL PM) 25-500 MG TABS Take 1 tablet by mouth at bedtime as needed.    doxazosin (CARDURA) 4 MG tablet Take 1 tablet (4 mg total) by mouth daily.    fluticasone (FLONASE) 50 MCG/ACT nasal spray Place 1 spray into both nostrils daily.    hydrALAZINE (APRESOLINE) 50 MG tablet Take 50 mg by mouth as needed.     lidocaine (LIDODERM) 5 % Place 1 patch onto the skin every 12 (twelve) hours. Remove & Discard patch within 12 hours or as directed by MD    mometasone-formoterol (DULERA) 100-5 MCG/ACT AERO Inhale 2 puffs into the lungs 2 (two) times daily.     Multiple Vitamin (MULTIVITAMIN) tablet Take 1 tablet by mouth daily.      nebivolol (BYSTOLIC) 5 MG tablet Take 1 tablet (5 mg total) by mouth daily. Qty: 30 tablet, Refills: 0    tiotropium (SPIRIVA) 18 MCG inhalation capsule Place 1 capsule (18 mcg total) into inhaler and inhale daily. Qty: 90 capsule, Refills: 0      STOP taking these medications     cephALEXin (KEFLEX) 500 MG capsule          DISCHARGE INSTRUCTIONS:    Patient is to follow-up with her primary care physician, Dr. Burnadette Pop  as outpatient  If you experience worsening of your admission symptoms, develop shortness of breath, life threatening emergency, suicidal or homicidal thoughts you must seek medical attention immediately by calling 911 or calling your MD immediately  if symptoms less severe.  You Must read complete instructions/literature along with all the possible adverse reactions/side effects for all the Medicines you take and that have been prescribed to you. Take any new Medicines after you have completely understood and accept all the possible adverse reactions/side effects.   Please note  You were cared for by a hospitalist during your hospital stay. If you have any questions about your discharge medications or the care you received while you were in the hospital after  you are discharged, you can call the unit and asked to speak with the hospitalist on call if the hospitalist that took care of you is not available. Once you are discharged, your primary care physician will handle any further medical issues. Please note that NO REFILLS for any discharge medications will be authorized once you are discharged, as it is  imperative that you return to your primary care physician (or establish a relationship with a primary care physician if you do not have one) for your aftercare needs so that they can reassess your need for medications and monitor your lab values.    Today   CHIEF COMPLAINT:   Chief Complaint  Patient presents with  . Weakness    HISTORY OF PRESENT ILLNESS:  Marliyah Reid  is a 81 y.o. female with a known history of multiple medical problems including hypertension, diabetes, COPD, emphysema, hyperlipidemia, high total hernia, chronic abdominal pain who presented to the hospital to emergency room with chest pain, upper division was hospitalized at an outside facility for pneumonia and sepsis. He she underwent the chest compressions CPR for what that looks like a vasovagal episode and had a sternal fracture. Now she presented to the hospital with worsening oxygenation requiring 4 L of oxygen through nasal cannula at from her 2 L at baseline. She admitted of splinting and having significant chest pains, especially on the right side of the chest with movements breathing, coughing. Chest x-ray reveals stable right basilar changes with small right effusion. Patient's labs were remarkable follow sodium level of 129, mild anemia with hemoglobin level of 10.5. Patient's urinalysis was unremarkable . Patient was admitted to the hospital for pain control. Patient's d-dimer was done, was found to be above 2000 and CT scan of the chest is ordered to rule out pulmonary embolism. CT scan of the chest showed no pulmonary embolism. However, is based consolidation in both  lung bases, more on the right than on the left with small right pleural effusion was noted, as well as stable collapse of T8 and T12 vertebral bodies.  Discussion by problem 1. Acute on chronic respiratory failure with hypoxia and hypercapnia, continue oxygen therapy, keeping her pulse oximeter is 86-92%, CT scan of the chest revealed pneumonia and T8, T12 vertebral body collapse, which could have contributed to pain with breathing and hypoxia. Patient is to continue steroids taper as well as antibiotic therapy for pneumonia, no change in antibiotic therapy was made today while she was in the hospital . It is our assumption that the patient's antibiotic therapy was based on culture results, deferring adjustment of therapy to primary physician ,  adding Humibid, continue nebulizer therapy. Pain management will likely improve patient's oxygenation as well as hypercapnia. Watch closely for mental status change with oxycodone 2. Hyponatremia chronically abnormal sodium levels, not much different from before,  patient is on diuretics ,  she is asymptomatic, follow levels as outpatient. Would benefit from checking TSH, as well as cortisol level, osmolarities of urine and serum to establish diagnosis of SIADH, would benefit from a fluid restriction, watching for dehydration 3. Chronic diastolic CHF symptoms stable to resume outpatient medications 4. COPD, stable as well. Continue steroid taper due to recent diagnosis of pneumonia, continue inhalation therapy, oxygen, keeping pulse oximeter at around 86-92%, patient's bicarbonate level seemed to be better today than it was yesterday with incentive spirometry, which patient would benefit from greatly.  5. Musculoskeletal chest pain. Continue patient on oxycodone, watching her mental status closely. Continue incentive spirometry. We feel that patient is stable to be discharged to skilled nursing facility for close supervision.   VITAL SIGNS:  Blood pressure 125/75,  pulse 73, temperature 97.5 F (36.4 C), temperature source Oral, resp. rate 16, height 5\' 5"  (1.651 m), weight 72.757 kg (160 lb 6.4 oz), SpO2 95 %.  I/O:    Intake/Output Summary (  Last 24 hours) at 07/29/15 1330 Last data filed at 07/29/15 1035  Gross per 24 hour  Intake    120 ml  Output   1200 ml  Net  -1080 ml    PHYSICAL EXAMINATION:  GENERAL:  81 y.o.-year-old patient lying in the bed with no acute distress.  EYES: Pupils equal, round, reactive to light and accommodation. No scleral icterus. Extraocular muscles intact.  HEENT: Head atraumatic, normocephalic. Oropharynx and nasopharynx clear.  NECK:  Supple, no jugular venous distention. No thyroid enlargement, no tenderness.  LUNGS: Somewhat diminished breath sounds bilaterally, no wheezing, rales,rhonchi or crepitation. No use of accessory muscles of respiration. Tender chest to palpation on the right side anteriorly, Lidoderm patch is  on the chest CARDIOVASCULAR: S1, S2 normal. No murmurs, rubs, or gallops.  ABDOMEN: Soft, diffuse mild tenderness to palpation but no rebound or guarding was noted, non-distended. Bowel sounds present. No organomegaly or mass.  EXTREMITIES: No pedal edema, cyanosis, or clubbing.  NEUROLOGIC: Cranial nerves II through XII are intact. Muscle strength 5/5 in all extremities. Sensation intact. Gait not checked.  PSYCHIATRIC: The patient is alert and oriented x 3.  SKIN: No obvious rash, lesion, or ulcer.   DATA REVIEW:   CBC  Recent Labs Lab 07/29/15 0533  WBC 11.9*  HGB 9.8*  HCT 30.3*  PLT 420    Chemistries   Recent Labs Lab 07/28/15 1608  07/29/15 0533  NA 129*  < > 129*  K 4.1  < > 4.8  CL 83*  < > 87*  CO2 37*  < > 34*  GLUCOSE 123*  < > 193*  BUN 15  < > 15  CREATININE 0.66  < > 0.64  CALCIUM 9.0  < > 9.0  AST 21  --   --   ALT 16  --   --   ALKPHOS 86  --   --   BILITOT 0.4  --   --   < > = values in this interval not displayed.  Cardiac Enzymes  Recent  Labs Lab 07/28/15 1608  TROPONINI 0.03    Microbiology Results  Results for orders placed or performed during the hospital encounter of 07/28/15  Blood culture (routine x 2)     Status: None (Preliminary result)   Collection Time: 07/28/15  9:09 PM  Result Value Ref Range Status   Specimen Description BLOOD BLOOD RIGHT FOREARM  Final   Special Requests BOTTLES DRAWN AEROBIC AND ANAEROBIC  Final   Culture NO GROWTH < 12 HOURS  Final   Report Status PENDING  Incomplete  Blood culture (routine x 2)     Status: None (Preliminary result)   Collection Time: 07/28/15  9:10 PM  Result Value Ref Range Status   Specimen Description BLOOD BLOOD RIGHT FOREARM  Final   Special Requests BOTTLES DRAWN AEROBIC AND ANAEROBIC  Final   Culture NO GROWTH < 12 HOURS  Final   Report Status PENDING  Incomplete    RADIOLOGY:  Dg Chest 2 View  07/27/2015   CLINICAL DATA:  Acute onset of generalized chest pain. Initial encounter.  EXAM: CHEST  2 VIEW  COMPARISON:  Chest radiograph performed 05/22/2015  FINDINGS: The lungs are well-aerated. New patchy right basilar airspace opacities raise concern for pneumonia. Asymmetric pulmonary edema is considered less likely. A small right pleural effusion is noted. No pneumothorax is seen.  Underlying vascular congestion is noted. The heart is enlarged. No acute osseous abnormalities are seen.  IMPRESSION:  1. New patchy right basilar airspace opacities raise concern for pneumonia. Asymmetric edema is considered less likely. Small right pleural effusion noted. Followup PA and lateral chest X-ray is recommended in 3-4 weeks following trial of antibiotic therapy to ensure resolution and exclude underlying malignancy. 2. Vascular congestion and cardiomegaly seen.   Electronically Signed   By: Roanna Raider M.D.   On: 07/27/2015 23:23   Ct Angio Chest Pe W/cm &/or Wo Cm  07/29/2015   CLINICAL DATA:  Chest pain and shortness of Breath  EXAM: CT ANGIOGRAPHY CHEST WITH  CONTRAST  TECHNIQUE: Multidetector CT imaging of the chest was performed using the standard protocol during bolus administration of intravenous contrast. Multiplanar CT image reconstructions and MIPs were obtained to evaluate the vascular anatomy.  CONTRAST:  80mL OMNIPAQUE IOHEXOL 350 MG/ML SOLN  COMPARISON:  Chest CT June 08, 2014; chest radiograph July 28, 2015  FINDINGS: There is no demonstrable pulmonary embolus. There is no thoracic aortic aneurysm or dissection. There is atherosclerotic change throughout the aorta. There is atherosclerotic change at the origins of the great vessels with superiorly severe obstructive disease at the origin of the left subclavian artery.  There is underlying centrilobular emphysematous change. There is a moderate right pleural effusion with patchy consolidation in the right lower lobe. A milder degree of airspace consolidation is noted in the posterior left base.  Visualized thyroid appears normal. There is no appreciable thoracic adenopathy. There is a small pericardial effusion. There is left ventricular hypertrophy. There are foci of coronary artery calcification at multiple sites.  There is a large paraesophageal hernia.  In the visualized upper abdomen, there is extensive atherosclerotic change in the aorta and proximal superior mesenteric artery. There is left adrenal hypertrophy.  There is marked collapse of the T8 and T12 vertebral bodies with increased kyphosis, stable. There are no blastic or lytic type bone lesions.  Review of the MIP images confirms the above findings.  IMPRESSION: No demonstrable pulmonary embolus.  Airspace consolidation in both lung bases, more on the right than on the left. Small right pleural effusion.  Extensive atherosclerotic calcification in the aorta and proximal great vessels, with greatest narrowing at the origin of the left subclavian artery. Multiple foci of coronary artery calcification.  No adenopathy.  Sizable paraesophageal  hernia.  Small pericardial effusion.  Left ventricular hypertrophy present.  Stable collapse of the T8 and T12 vertebral bodies with increased kyphosis.   Electronically Signed   By: Bretta Bang III M.D.   On: 07/29/2015 13:08   Dg Chest Port 1 View  07/28/2015   CLINICAL DATA:  Weakness and shortness of Breath  EXAM: PORTABLE CHEST - 1 VIEW  COMPARISON:  07/27/2015  FINDINGS: Cardiac shadow is enlarged but stable. The lungs are well aerated bilaterally and again demonstrates some mild patchy changes in the right lung base with small effusion. Left lung remains clear. Healed proximal right humeral fracture  IMPRESSION: Stable right basilar changes   Electronically Signed   By: Alcide Clever M.D.   On: 07/28/2015 20:50    EKG:   Orders placed or performed during the hospital encounter of 07/28/15  . ED EKG  . ED EKG      Management plans discussed with the patient, family and they are in agreement.  CODE STATUS:     Code Status Orders        Start     Ordered   07/28/15 2224  Do not attempt resuscitation (DNR)   Continuous  Question Answer Comment  In the event of cardiac or respiratory ARREST Do not call a "code blue"   In the event of cardiac or respiratory ARREST Do not perform Intubation, CPR, defibrillation or ACLS   In the event of cardiac or respiratory ARREST Use medication by any route, position, wound care, and other measures to relive pain and suffering. May use oxygen, suction and manual treatment of airway obstruction as needed for comfort.      07/28/15 2224    Advance Directive Documentation        Most Recent Value   Type of Advance Directive  Living will   Pre-existing out of facility DNR order (yellow form or pink MOST form)     "MOST" Form in Place?        TOTAL TIME TAKING CARE OF THIS PATIENT: 45 minutes.    Katharina Caper M.D on 07/29/2015 at 1:30 PM  Between 7am to 6pm - Pager - [phone]  After 6pm go to [email or web address] - password [email or web address]  [facility]  [facility]  Office  [phone]  CC: Primary care physician; Marisue Ivan, MD

## 2015-07-29 NOTE — Plan of Care (Signed)
Problem: Discharge Progression Outcomes Goal: Other Discharge Outcomes/Goals Plan of care progress to goal: - Pain control via PRN pain meds with improvement. - Assist with bedpan. - Continues on IV solumedrol. - On 3L of 02 per [place], tolerating well. Will continue to monitor.

## 2015-07-29 NOTE — Plan of Care (Signed)
Problem: Discharge Progression Outcomes Goal: Discharge plan in place and appropriate Individualization of care Likes to be called Catherine Holmes. Lives at home with daughter. On high falls precaution per policy, offer toileting during hourly rounds. Has history of COPD, diabetes, hypertension and osteoporosis, controlled by medications.

## 2015-07-29 NOTE — Progress Notes (Signed)
Patient is medically stable for D/C back to [facility] today. Per Kim admissions coordinator at [facility] patient will go to private room 217. RN will call report at [phone] and arrange EMS for transport. Clinical Child psychotherapist (CSW) prepared D/C packet and sent D/C Summary to [employer]. Patient is aware of above. CSW contacted patient's daughter Eunice Blase and made her aware of above. Please reconsult if future social work needs arise. CSW signing off.   Jetta Lout, LCSWA [phone]

## 2015-07-29 NOTE — Progress Notes (Signed)
EMS arrived for pt, daughter, Eunice Blase, at bedside.

## 2015-07-29 NOTE — Plan of Care (Signed)
Problem: Discharge Progression Outcomes Goal: Activity appropriate for discharge plan Outcome: Progressing Pt is alert and oriented x 4, pt c/o chest pain improved with oxyciodone and morphine, continues on 2 L oxygen, no bm throughout shift, bedridden at this time, easily irritable, wbc increased slightly, CT chest showed sternum fractures and pne, pt on clindamycin po, pt is d/c to [facility], report given to Selena Batten, Water quality scientist and CSW spoke with daughter and updated on transportation. Uneventful shift.

## 2015-07-29 NOTE — Clinical Social Work Note (Signed)
Clinical Social Work Assessment  Patient Details  Name: Catherine Holmes MRN: [id number] Date of Birth: 26-Jun-1928  Date of referral:  07/29/15               Reason for consult:  Facility Placement, Other (Comment Required) (From [facility] (S[facility]) )                Permission sought to share information with:  Chartered certified accountant granted to share information::  Yes, Verbal Permission Granted  Name::      IT sales professional::   [place]   Relationship::     Contact Information:     Housing/Transportation Living arrangements for the past 2 months:  [place], [place] of Information:  Patient, Catherine Holmes, Adult Children Patient Interpreter Needed:  None Criminal Activity/Legal Involvement Pertinent to Current Situation/Hospitalization:  No - Comment as needed Significant Relationships:  Adult Children Lives with:  Self Do you feel safe going back to the place where you live?  Yes Need for family participation in patient care:  Yes (Comment)  Care giving concerns: Patient is a short term rehab resident at [facility].    Social Worker assessment / plan: Per Patent attorney at [employer] patient was at [facility] in [place] from 07/19/15 to 07/27/15. Patient was admitted to [facility] from [facility] on 07/27/15. Patient came to [facility] under observation status on 07/28/15. Per Maudie Mercury they will accept patient back. Clinical Social Worker (CSW) met with patient and her daughter Catherine Holmes [phone] was at bedside. Per patient Catherine Holmes is her HPOA. Patient was alert and oriented and laying in the bed. CSW introduced self and explained role of CSW department. Patient reported that she was on vacation in [place] and had to go to the hospital. Per patient she is from [place]. Patient is agreeable to returning to [facility]. CSW will continue to follow and assist as needed.    Employment status:   Retired Forensic scientist:  Medicare PT Recommendations:  Not assessed at this time [place] / Referral to community resources:  [place]  Patient/Family's Response to care:  Patient and daughter are agreeable to return to [employer]   Patient/Family's Understanding of and Emotional Response to Diagnosis, Current Treatment, and Prognosis: Patient was pleasant throughout assessment.   Emotional Assessment Appearance:  Appears stated age Attitude/Demeanor/Rapport:    Affect (typically observed):  Accepting, Adaptable, Pleasant Orientation:  Oriented to Self, Oriented to Place, Oriented to  Time, Oriented to Situation Alcohol / Substance use:  Not Applicable Psych involvement (Current and /or in the community):  No (Comment)  Discharge Needs  Concerns to be addressed:  Discharge Planning Concerns Readmission within the last 30 days:  No Current discharge risk:  None Barriers to Discharge:  No Barriers Identified   Loralyn Freshwater, LCSW 07/29/2015, 11:57 AM

## 2015-07-29 NOTE — Discharge Instructions (Signed)

## 2015-07-30 LAB — GLUCOSE, CAPILLARY
Glucose-Capillary: 154 mg/dL — ABNORMAL HIGH (ref 65–99)
Glucose-Capillary: 141 mg/dL — ABNORMAL HIGH (ref 65–99)
Glucose-Capillary: 158 mg/dL — ABNORMAL HIGH (ref 65–99)
Glucose-Capillary: 157 mg/dL — ABNORMAL HIGH (ref 65–99)

## 2015-07-31 LAB — GLUCOSE, CAPILLARY
Glucose-Capillary: 220 mg/dL — ABNORMAL HIGH (ref 65–99)
Glucose-Capillary: 125 mg/dL — ABNORMAL HIGH (ref 65–99)
Glucose-Capillary: 132 mg/dL — ABNORMAL HIGH (ref 65–99)

## 2015-08-01 ENCOUNTER — Encounter: Payer: Self-pay | Admitting: Respiratory Therapy

## 2015-08-01 DIAGNOSIS — I509 Heart failure, unspecified: Secondary | ICD-10-CM

## 2015-08-01 DIAGNOSIS — J449 Chronic obstructive pulmonary disease, unspecified: Secondary | ICD-10-CM

## 2015-08-01 LAB — GLUCOSE, CAPILLARY
GLUCOSE-CAPILLARY: 180 mg/dL — AB (ref 65–99)
Glucose-Capillary: 131 mg/dL — ABNORMAL HIGH (ref 65–99)
Glucose-Capillary: 167 mg/dL — ABNORMAL HIGH (ref 65–99)

## 2015-08-01 NOTE — Progress Notes (Signed)
Pulmonary Individual Treatment Plan  Patient Details  Name: Catherine Holmes MRN: [id number] Date of Birth: 12/14/1927 Referring Provider:    Initial Encounter Date: 04/19/2015  Visit Diagnosis: Chronic congestive heart failure, unspecified congestive heart failure type (HCC)  COPD, mild (HCC)  Patient's Home Medications on Admission:  Current outpatient prescriptions:    albuterol (PROAIR HFA) 108 (90 BASE) MCG/ACT inhaler, Inhale 2 puffs into the lungs every 6 (six) hours as needed., Disp: 3 Inhaler, Rfl: 0   albuterol (PROVENTIL) (2.5 MG/3ML) 0.083% nebulizer solution, Take 2.5 mg by nebulization every 6 (six) hours as needed for wheezing or shortness of breath. , Disp: , Rfl:    arformoterol (BROVANA) 15 MCG/2ML NEBU, Take 2 mLs (15 mcg total) by nebulization 2 (two) times daily., Disp: 120 mL, Rfl: 4   aspirin EC 81 MG tablet, Take 81 mg by mouth daily., Disp: , Rfl:    beta carotene w/minerals (OCUVITE) tablet, Take 1 tablet by mouth 2 (two) times daily., Disp: , Rfl:    bumetanide (BUMEX) 1 MG tablet, Take 1 tablet (1 mg total) by mouth daily. Take 1 tab daily (Patient taking differently: Take 1 mg by mouth as needed. Take 1 tab daily), Disp: 180 tablet, Rfl: 3   calcium carbonate (OS-CAL) 600 MG TABS tablet, Take 600 mg by mouth daily with breakfast., Disp: , Rfl:    carvedilol (COREG) 3.125 MG tablet, Take 3.125 mg by mouth 2 (two) times daily with a meal., Disp: , Rfl:    Cholecalciferol (VITAMIN D-3 PO), Take 1,000 Units by mouth., Disp: , Rfl:    citalopram (CELEXA) 20 MG tablet, Take 20 mg by mouth daily., Disp: , Rfl:    clindamycin (CLEOCIN) 300 MG capsule, Take 300 mg by mouth 4 (four) times daily., Disp: , Rfl:    diphenhydramine-acetaminophen (TYLENOL PM) 25-500 MG TABS, Take 1 tablet by mouth at bedtime as needed., Disp: , Rfl:    doxazosin (CARDURA) 4 MG tablet, Take 1 tablet (4 mg total) by mouth daily., Disp: , Rfl:    ezetimibe (ZETIA) 10 MG tablet, Take  5 mg by mouth daily., Disp: , Rfl:    fluticasone (FLONASE) 50 MCG/ACT nasal spray, Place 1 spray into both nostrils daily., Disp: , Rfl:    gabapentin (NEURONTIN) 100 MG capsule, Take 100 mg by mouth 2 (two) times daily. , Disp: , Rfl:    hydrALAZINE (APRESOLINE) 50 MG tablet, Take 50 mg by mouth as needed. , Disp: , Rfl:    HYDROcodone-acetaminophen (NORCO/VICODIN) 5-325 MG tablet, Take 1-2 tablets by mouth 2 (two) times daily as needed for moderate pain., Disp: , Rfl:    lidocaine (LIDODERM) 5 %, Place 1 patch onto the skin every 12 (twelve) hours. Remove & Discard patch within 12 hours or as directed by MD, Disp: , Rfl:    methylPREDNISolone (MEDROL DOSEPAK) 4 MG TBPK tablet, follow package directions, Disp: 21 tablet, Rfl: 0   mometasone-formoterol (DULERA) 100-5 MCG/ACT AERO, Inhale 2 puffs into the lungs 2 (two) times daily. , Disp: , Rfl:    Multiple Vitamin (MULTIVITAMIN) tablet, Take 1 tablet by mouth daily.  , Disp: , Rfl:    mupirocin ointment (BACTROBAN) 2 %, Apply topically., Disp: , Rfl:    nebivolol (BYSTOLIC) 5 MG tablet, Take 1 tablet (5 mg total) by mouth daily. (Patient not taking: Reported on 07/27/2015), Disp: 30 tablet, Rfl: 0   nitroGLYCERIN (NITROSTAT) 0.4 MG SL tablet, Place 1 tablet (0.4 mg total) under the tongue every 5 (  five) minutes as needed for chest pain., Disp: 25 tablet, Rfl: 6   oxyCODONE (OXY IR/ROXICODONE) 5 MG immediate release tablet, Take 1 tablet (5 mg total) by mouth every 4 (four) hours as needed for moderate pain., Disp: 30 tablet, Rfl: 0   potassium chloride (K-DUR,KLOR-CON) 10 MEQ tablet, Take 0.5 tablets (5 mEq total) by mouth 2 (two) times daily as needed. Patient takes this when she takes Bumex., Disp: 60 tablet, Rfl: 3   spironolactone (ALDACTONE) 25 MG tablet, Take 1 tablet (25 mg total) by mouth daily., Disp: 90 tablet, Rfl: 3   tiotropium (SPIRIVA) 18 MCG inhalation capsule, Place 1 capsule (18 mcg total) into inhaler and inhale  daily., Disp: 90 capsule, Rfl: 0 No current facility-administered medications for this visit.  Facility-Administered Medications Ordered in Other Visits:    0.9 %  sodium chloride infusion, , Intravenous, Once, Rosey Bath, MD   alteplase (CATHFLO ACTIVASE) injection 2 mg, 2 mg, Intracatheter, Once PRN, Rosey Bath, MD   ferumoxytol (FERAHEME) 510 mg in sodium chloride 0.9 % 100 mL IVPB, 510 mg, Intravenous, Once, Rosey Bath, MD   heparin lock flush 100 unit/mL, 500 Units, Intracatheter, Once PRN, Rosey Bath, MD   heparin lock flush 100 unit/mL, 250 Units, Intracatheter, Once PRN, Rosey Bath, MD   sodium chloride 0.9 % injection 10 mL, 10 mL, Intracatheter, PRN, Rosey Bath, MD   sodium chloride 0.9 % injection 3 mL, 3 mL, Intravenous, Once PRN, Rosey Bath, MD  Past Medical History: Past Medical History  Diagnosis Date   Essential hypertension    Diabetes mellitus type II, controlled (HCC)    COPD (chronic obstructive pulmonary disease) (HCC)    Osteoporosis    Carotid arterial disease (HCC)     a. 2012 Carotid dopplers: 40-59% R ICA, 60-79% L ICA   [place]    Arthralgia    Sjoegren syndrome (HCC)    TIA (transient ischemic attack)    Emphysema    MVP (mitral valve prolapse)    Chronic diastolic CHF (congestive heart failure) (HCC)     a. 08/2011 Echo: EF 65-70%, mild LVH, mod dil LA, mildly dil RA, mild MR, mild-mod AoV Sclerosis w/o stenosis, mod-sev elev PA pressures, mild TR.   Acid reflux    Diverticulitis    Anemia    History of pneumonia    Syncope and collapse    Anemia    Hip fracture, left (HCC)    Hyperlipidemia    Hiatal hernia    Chest pain     a. 08/2011 MV: EF 74%, no ischemia.   Noncompliance     a. h/o not taking bumex as Rx.   PAF (paroxysmal atrial fibrillation) (HCC)     a. CHA2DS2VASc = 8 ->No OAC 2/2 falls.    Tobacco Use: History  Smoking status   Former  Smoker -- 0.50 packs/day for 30 years   Types: Cigarettes   Quit date: 10/22/1994  Smokeless tobacco   Never Used    Labs: Recent Review Flowsheet Data    There is no flowsheet data to display.         POCT Glucose      04/27/15 1130 05/04/15 1125         POCT Blood Glucose   Pre-Exercise 115 mg/dL       Post-Exercise 914 mg/dL       Pre-Exercise #2  99 mg/dL      Post-Exercise #2  109  mg/dL      Pre-Exercise #3  454 mg/dL      Post-Exercise #3  102 mg/dL         ADL UCSD:     ADL UCSD      04/19/15 1305       ADL UCSD   ADL Phase Entry     SOB Score total 92     Rest 1     Walk 4     Stairs 5     Bath 4     Dress 5     Shop 5         Pulmonary Function Assessment:     Pulmonary Function Assessment - 04/19/15 1130    Pulmonary Function Tests   FVC% 36 %   FEV1% 35 %   FEV1/FVC Ratio 71.7   Breath   Bilateral Breath Sounds Clear;Decreased   Shortness of Breath Yes      Exercise Target Goals:    Exercise Program Goal: Individual exercise prescription set with THRR, safety & activity barriers. Participant demonstrates ability to understand and report RPE using BORG scale, to self-measure pulse accurately, and to acknowledge the importance of the exercise prescription.  Exercise Prescription Goal: Starting with aerobic activity 30 plus minutes a day, 3 days per week for initial exercise prescription. Provide home exercise prescription and guidelines that participant acknowledges understanding prior to discharge.  Activity Barriers & Risk Stratification:     Activity Barriers & Risk Stratification - 04/19/15 1130    Activity Barriers & Risk Stratification   Activity Barriers Shortness of Breath;Balance Concerns;Deconditioning;Assistive Device   Risk Stratification Moderate      6 Minute Walk:     6 Minute Walk      04/19/15 1420       6 Minute Walk   Phase Initial     Distance 180 feet     Walk Time 3 minutes     Resting HR 67  bpm     Resting BP 122/70 mmHg     Max Ex. HR 82 bpm     Max Ex. BP 132/74 mmHg     RPE 15     Perceived Dyspnea  4     Symptoms No        Initial Exercise Prescription:     Initial Exercise Prescription - 04/19/15 1500    Date of Initial Exercise Prescription   Date 04/19/15   Treadmill   MPH 1   Grade 0   Minutes 10   Recumbant Bike   Level 1   RPM 40   Watts 20   Minutes 10   NuStep   Level 2   Watts 40   Minutes 10   Arm Ergometer   Level 1   Watts 10   Minutes 10   REL-XR   Level 1   Watts 40   Minutes 10   Prescription Details   Frequency (times per week) 3   Duration Progress to 30 minutes of continuous aerobic without signs/symptoms of physical distress   Intensity   THRR REST +  30   Ratings of Perceived Exertion 11-15   Perceived Dyspnea 2-4   Progression Continue progressive overload as per policy without signs/symptoms or physical distress.   Resistance Training   Training Prescription Yes   Weight 1   Reps 10-12      Exercise Prescription Changes:     Exercise Prescription Changes      04/27/15 1500 04/29/15  1200 05/04/15 1500 05/09/15 1500 06/06/15 1500   Exercise Review   Progression   Yes Yes Yes   Response to Exercise   Blood Pressure (Admit) 118/70 mmHg 118/70 mmHg 120/78 mmHg 120/78 mmHg 100/60 mmHg   Blood Pressure (Exercise) 130/70 mmHg 130/70 mmHg 130/68 mmHg 130/68 mmHg 144/74 mmHg   Blood Pressure (Exit) 120/62 mmHg 120/62 mmHg 110/66 mmHg 110/66 mmHg 122/72 mmHg   Heart Rate (Admit) 68 bpm 68 bpm 65 bpm 65 bpm 71 bpm   Heart Rate (Exercise) 105 bpm 105 bpm 78 bpm 78 bpm 70 bpm   Heart Rate (Exit) 90 bpm 90 bpm 63 bpm 63 bpm 90 bpm   Oxygen Saturation (Admit) 92 % 92 % 93 %  2l/m [place] 93 %  2l/m [place] 96 %  2l/m [place]   Oxygen Saturation (Exercise) 90 % 90 % 88 % 88 % 93 %  2l/m   Oxygen Saturation (Exit) 95 % 95 % 96 % 96 % 91 %   Rating of Perceived Exertion (Exercise) 13 13 15 15 14    Perceived Dyspnea (Exercise) 3 3 4 4 4     Resistance Training   Training Prescription Yes Yes Yes Yes Yes   Weight 1 1 1 1 1    Treadmill   MPH 0.8 0.8 1 1 1    Grade 0 0 0 0 0   Minutes 8 8 10 10 10    Recumbant Bike   Level 2 2 1 1 2   BioStep   RPM 24 24 40 40 30   Minutes 10 10 10 10 20    NuStep   Level 2 2 2 2 2    Watts 20 20 40 40 40   Minutes 10 10 10 10 20    Recumbant Elliptical   Level  2  BIOSTEP      Watts  30      Minutes  10        07/01/15 1200 07/04/15 1500         Exercise Review   Progression Yes Yes      Response to Exercise   Blood Pressure (Admit) 100/60 mmHg 122/70 mmHg      Blood Pressure (Exercise) 144/74 mmHg 126/74 mmHg      Blood Pressure (Exit) 122/72 mmHg       Heart Rate (Admit) 71 bpm 71 bpm      Heart Rate (Exercise) 70 bpm 80 bpm      Heart Rate (Exit) 90 bpm       Oxygen Saturation (Admit) 96 %  2l/m [place] 97 %  2l/m [place]      Oxygen Saturation (Exercise) 93 %  2l/m 96 %  2l/m      Oxygen Saturation (Exit) 91 %       Rating of Perceived Exertion (Exercise) 14 11      Perceived Dyspnea (Exercise) 4 2      Duration Progress to 30 minutes of continuous aerobic without signs/symptoms of physical distress Progress to 30 minutes of continuous aerobic without signs/symptoms of physical distress      Intensity THRR unchanged THRR unchanged      Progression Continue progressive overload as per policy without signs/symptoms or physical distress. Continue progressive overload as per policy without signs/symptoms or physical distress.      Resistance Training   Training Prescription Yes Yes      Weight 1 1      Reps 10-12 10-12      Treadmill   MPH 1  Grade 0 0      Minutes 10       Recumbant Bike   Level 2  BioStep 3  BioStep      RPM 30       Watts  35      Minutes 20 20      NuStep   Level 2 2      Watts 40 30      Minutes 20 20      Recumbant Elliptical   Level 2       Watts 30       Minutes 10       REL-XR   Level 2       Watts 40       Minutes 12           Discharge Exercise Prescription (Final Exercise Prescription Changes):     Exercise Prescription Changes - 07/04/15 1500    Exercise Review   Progression Yes   Response to Exercise   Blood Pressure (Admit) 122/70 mmHg   Blood Pressure (Exercise) 126/74 mmHg   Heart Rate (Admit) 71 bpm   Heart Rate (Exercise) 80 bpm   Oxygen Saturation (Admit) 97 %  2l/m [place]   Oxygen Saturation (Exercise) 96 %  2l/m   Rating of Perceived Exertion (Exercise) 11   Perceived Dyspnea (Exercise) 2   Duration Progress to 30 minutes of continuous aerobic without signs/symptoms of physical distress   Intensity THRR unchanged   Progression Continue progressive overload as per policy without signs/symptoms or physical distress.   Resistance Training   Training Prescription Yes   Weight 1   Reps 10-12   Treadmill   Grade 0   Recumbant Bike   Level 3  BioStep   Watts 35   Minutes 20   NuStep   Level 2   Watts 30   Minutes 20       Nutrition:  Target Goals: Understanding of nutrition guidelines, daily intake of sodium 1500mg , cholesterol 200mg , calories 30% from fat and 7% or less from saturated fats, daily to have 5 or more servings of fruits and vegetables.  Biometrics:     Pre Biometrics - 04/19/15 1424    Pre Biometrics   Height 5' (1.524 m)   Weight 165 lb (74.844 kg)   Waist Circumference 41 inches   Hip Circumference 45 inches   Waist to Hip Ratio 0.91 %   BMI (Calculated) 32.3       Nutrition Therapy Plan and Nutrition Goals:     Nutrition Therapy & Goals - 04/19/15 1312    Nutrition Therapy   Diet Ms Sivley would like to meet with the dietitian. her goal is to lose 15lbs.      Nutrition Discharge: Rate Your Plate Scores:   Psychosocial: Target Goals: Acknowledge presence or absence of depression, maximize coping skills, provide positive support system. Participant is able to verbalize types and ability to use techniques and skills needed for reducing stress and  depression.  Initial Review & Psychosocial Screening:     Initial Psych Review & Screening - 04/19/15 1130    Barriers   Psychosocial barriers to participate in program There are no identifiable barriers or psychosocial needs.  Ms Arrighi has good family support from her daughter who lives with Ms Venditto. Ms Baucom just lost her son in December 2015 and indicated depression over this loss.   Screening Interventions   Interventions Program counselor consult;Encouraged to exercise  Quality of Life Scores:   PHQ-9:     Recent Review Flowsheet Data    There is no flowsheet data to display.      Psychosocial Evaluation and Intervention:   Psychosocial Re-Evaluation:  Education: Education Goals: Education classes will be provided on a weekly basis, covering required topics. Participant will state understanding/return demonstration of topics presented.  Learning Barriers/Preferences:     Learning Barriers/Preferences - 04/19/15 1130    Learning Barriers/Preferences   Learning Barriers None   Learning Preferences Group Instruction;Individual Instruction;Pictoral;Skilled Demonstration;Written Material;Video;Verbal Instruction      Education Topics: Initial Evaluation Education: - Verbal, written and demonstration of respiratory meds, RPE/PD scales, oximetry and breathing techniques. Instruction on use of nebulizers and MDIs: cleaning and proper use, rinsing mouth with steroid doses and importance of monitoring MDI activations.          Pulmonary Rehab from 07/08/2015 in [facility]   Date  04/19/15   Educator  LB   Instruction Review Code  2- meets goals/outcomes      General Nutrition Guidelines/Fats and Fiber: -Group instruction provided by verbal, written material, models and posters to present the general guidelines for heart healthy nutrition. Gives an explanation and review of dietary fats and fiber.      Pulmonary Rehab  from 07/08/2015 in [facility]   Date  06/06/15   Educator  C. Perlie Gold, RD   Instruction Review Code  2- meets goals/outcomes      Controlling Sodium/Reading Food Labels: -Group verbal and written material supporting the discussion of sodium use in heart healthy nutrition. Review and explanation with models, verbal and written materials for utilization of the food label.   Exercise Physiology & Risk Factors: - Group verbal and written instruction with models to review the exercise physiology of the cardiovascular system and associated critical values. Details cardiovascular disease risk factors and the goals associated with each risk factor.      Pulmonary Rehab from 07/08/2015 in [facility]   Date  06/29/15   Educator  SW   Instruction Review Code  2- meets goals/outcomes      Aerobic Exercise & Resistance Training: - Gives group verbal and written discussion on the health impact of inactivity. On the components of aerobic and resistive training programs and the benefits of this training and how to safely progress through these programs.      Pulmonary Rehab from 07/08/2015 in [facility]   Date  04/27/15   Educator  SW   Instruction Review Code  2- meets goals/outcomes      Flexibility, Balance, General Exercise Guidelines: - Provides group verbal and written instruction on the benefits of flexibility and balance training programs. Provides general exercise guidelines with specific guidelines to those with heart or lung disease. Demonstration and skill practice provided.   Stress Management: - Provides group verbal and written instruction about the health risks of elevated stress, cause of high stress, and healthy ways to reduce stress.   Depression: - Provides group verbal and written instruction on the correlation between heart/lung disease and depressed mood,  treatment options, and the stigmas associated with seeking treatment.   Exercise & Equipment Safety: - Individual verbal instruction and demonstration of equipment use and safety with use of the equipment.      Pulmonary Rehab from 07/08/2015 in [facility]   Date  04/27/15   Educator  RM   Instruction Review Code  2- meets goals/outcomes      Infection Prevention: - Provides verbal and written material to individual with discussion of infection control including proper hand washing and proper equipment cleaning during exercise session.      Pulmonary Rehab from 07/08/2015 in [facility]   Date  04/27/15   Educator  LB   Instruction Review Code  2- meets goals/outcomes      Falls Prevention: - Provides verbal and written material to individual with discussion of falls prevention and safety.      Pulmonary Rehab from 07/08/2015 in [facility]   Date  04/19/15   Educator  LB   Instruction Review Code  2- meets goals/outcomes      Diabetes: - Individual verbal and written instruction to review signs/symptoms of diabetes, desired ranges of glucose level fasting, after meals and with exercise. Advice that pre and post exercise glucose checks will be done for 3 sessions at entry of program.      Pulmonary Rehab from 07/08/2015 in [facility]   Date  06/24/15 [Know your numbers]   Educator  CE   Instruction Review Code  2- meets goals/outcomes      Chronic Lung Diseases: - Group verbal and written instruction to review new updates, new respiratory medications, new advancements in procedures and treatments. Provide informative websites and "800" numbers of self-education.      Pulmonary Rehab from 07/08/2015 in [facility]   Date  05/30/15   Educator  Hulen Luster, RRT   Instruction Review Code  2-  meets goals/outcomes      Lung Procedures: - Group verbal and written instruction to describe testing methods done to diagnose lung disease. Review the outcome of test results. Describe the treatment choices: Pulmonary Function Tests, ABGs and oximetry.      Pulmonary Rehab from 07/08/2015 in [facility]   Date  05/27/15   Educator  sj   Instruction Review Code  2- meets goals/outcomes      Energy Conservation: - Provide group verbal and written instruction for methods to conserve energy, plan and organize activities. Instruct on pacing techniques, use of adaptive equipment and posture/positioning to relieve shortness of breath.      Pulmonary Rehab from 07/08/2015 in [facility]   Date  06/08/15   Educator  SW   Instruction Review Code  2- meets goals/outcomes      Triggers: - Group verbal and written instruction to review types of environmental controls: home humidity, furnaces, filters, dust mite/pet prevention, HEPA vacuums. To discuss weather changes, air quality and the benefits of nasal washing.   Exacerbations: - Group verbal and written instruction to provide: warning signs, infection symptoms, calling MD promptly, preventive modes, and value of vaccinations. Review: effective airway clearance, coughing and/or vibration techniques. Create an Sport and exercise psychologist.   Oxygen: - Individual and group verbal and written instruction on oxygen therapy. Includes supplement oxygen, available portable oxygen systems, continuous and intermittent flow rates, oxygen safety, concentrators, and Medicare reimbursement for oxygen.      Pulmonary Rehab from 07/08/2015 in [facility]   Date  04/19/15   Educator  LB   Instruction Review Code  2- meets goals/outcomes      Respiratory Medications: - Group verbal and written instruction to review medications for lung disease. Drug  class,  frequency, complications, importance of spacers, rinsing mouth after steroid MDI's, and proper cleaning methods for nebulizers.      Pulmonary Rehab from 07/08/2015 in [facility]   Date  04/19/15   Educator  LB   Instruction Review Code  2- meets goals/outcomes      AED/CPR: - Group verbal and written instruction with the use of models to demonstrate the basic use of the AED with the basic ABC's of resuscitation.      Pulmonary Rehab from 07/08/2015 in [facility]   Date  06/03/15   Educator  CE   Instruction Review Code  2- meets goals/outcomes      Breathing Retraining: - Provides individuals verbal and written instruction on purpose, frequency, and proper technique of diaphragmatic breathing and pursed-lipped breathing. Applies individual practice skills.      Pulmonary Rehab from 07/08/2015 in [facility]   Date  04/27/15   Educator  LB   Instruction Review Code  2- meets goals/outcomes      Anatomy and Physiology of the Lungs: - Group verbal and written instruction with the use of models to provide basic lung anatomy and physiology related to function, structure and complications of lung disease.      Pulmonary Rehab from 07/08/2015 in [facility]   Date  07/08/15   Educator  Tamsen Snider, RRT   Instruction Review Code  2- meets goals/outcomes      Heart Failure: - Group verbal and written instruction on the basics of heart failure: signs/symptoms, treatments, explanation of ejection fraction, enlarged heart and cardiomyopathy.   Sleep Apnea: - Individual verbal and written instruction to review Obstructive Sleep Apnea. Review of risk factors, methods for diagnosing and types of masks and machines for OSA.   Anxiety: - Provides group, verbal and written instruction on the correlation between heart/lung disease and anxiety,  treatment options, and management of anxiety.      Pulmonary Rehab from 07/08/2015 in [facility]   Date  06/15/15   Educator  Heloise Ochoa, MSW   Instruction Review Code  2- Meets goals/outcomes      Relaxation: - Provides group, verbal and written instruction about the benefits of relaxation for patients with heart/lung disease. Also provides patients with examples of relaxation techniques.   Knowledge Questionnaire Score:     Knowledge Questionnaire Score - 04/19/15 1130    Knowledge Questionnaire Score   Pre Score -3      Personal Goals and Risk Factors at Admission:     Personal Goals and Risk Factors at Admission - 04/19/15 1130    Personal Goals and Risk Factors on Admission    Weight Management Yes   Intervention Learn and follow the exercise and diet guidelines while in the program. Utilize the nutrition and education classes to help gain knowledge of the diet and exercise expectations in the program  Ms Deziel would like to meet with the dietitian.  Her goal is to lose 15lbs. She lives with her daughter and they do eat alot of fruits and vegetables; no salt.   Admit Weight 165 lb (74.844 kg)   Goal Weight 150 lb (68.04 kg)   Increase Aerobic Exercise and Physical Activity Yes   Intervention While in program, learn and follow the exercise prescription taught. Start at a low level workload and increase workload after able to maintain previous level for  30 minutes. Increase time before increasing intensity.  Ms Hurlbutt does have a pedal machine. She would like to strength herself where she is not so dependent on her cane.   Understand more about Heart/Pulmonary Disease. Yes   Intervention While in program utilize professionals for any questions, and attend the education sessions. Great websites to use are www.americanheart.org or www.lung.org for reliable information.  Ms Derryberry has COPD and CHF and is interested in learning more to  manage her diseases.   Improve shortness of breath with ADL's Yes   Intervention While in program, learn and follow the exercise prescription taught. Start at a low level workload and increase workload ad advised by the exercise physiologist. Increase time before increasing intensity.  Ms Kissinger scored a 89 on the [employer] SOB Questionnaire. She would like to do more activity without shortness of breath.   Develop more efficient breathing techniques such as purse lipped breathing and diaphragmatic breathing; and practicing self-pacing with activity Yes   Intervention While in program, learn and utilize the specific breathing techniques taught to you. Continue to practice and use the techniques as needed.   Increase knowledge of respiratory medications and ability to use respiratory devices properly.  Yes   Intervention While in program learn and demonstrate appropriate use of your oxygen therapy by increasing flow with exertion, manage oxygen tank operation, including continuous and intermittent flow.  Understanding oxygen is a drug ordered by your physician.;While in program, learn to administer MDI, nebulizer, and spacer properly.;Learn to take respiratory medicine as ordered.;While in program, learn to Clean MDI, nebulizers, and spacers properly.  Ms Pellum has a good understanding of her Annie Sable, ProAir, Spacer, and Albuterol  nebulizer.  She uses a gas portable O2 tank, 2l/m [place], and her service is from [facility].   Diabetes Yes   Hypertension Yes   Lipids Yes      Personal Goals and Risk Factors Review:      Goals and Risk Factor Review      05/30/15 1130 06/03/15 1610 06/06/15 1130 06/24/15 1259 07/01/15 1130   Weight Management   Goals Progress/Improvement seen   Yes Yes    Comments   Ms Gwaltney has lose 5lbs since her start date in [facility]. She would like to meet with the dietitian. Kaidence wore a shirt today that she couldn't button before she was in this program and has  seen her "belly go down". She has noticed changes in her body composition and is very impressed with the results from the exercise. She is down 6 lbs since starting in the program and weighs 163 with the goal of getting down to 150-155 lbs.     Increase Aerobic Exercise and Physical Activity   Goals Progress/Improvement seen  Yes   Yes    Comments Ms Schecter worked hard today to meet her exercise goals on the NS and BioStep - total exercise time ; plus she stayed for weight training; she states the importance for her and her health to be active.    The exercise equipment is getting easier for Carlisa and she has been able to make increases since starting. She really enjoys the equipment and has noticed she can get up easier and can do more at the house since starting [facility].     Understand more about Heart/Pulmonary Disease   Goals Progress/Improvement seen    Yes Yes    Comments   Ms Lees enjoys the education and states she has learned more  new information for disease management. The education has been very helpful and Dea has learned a lot about pulmonary disease and how to manage it. She especially has learned a lot from one on one conversations with Laureen.     Improve shortness of breath with ADL's   Goals Progress/Improvement seen    Yes Yes    Comments   Ms Mariotti states she has noticed a difference in her shortness of breath with activites at home, especially her morning routine. Elverna is able to move around her house better and isn't as tired when she goes out for appointments. She has noticed that she does not need her oxygen as much since starting this program and is less short of breath.    Breathing Techniques   Goals Progress/Improvement seen  Yes   Yes    Comments Ms Marciano uses PLB with her exercise goals and finds it very helpful.   Emalynn uses the breathing techniques she has learned in class when she is at home; she tries to do diaphramatic breathing 3 times a day for 10  repetitions. She also uses pursed lipped breathing when on the machines in class.     Increase knowledge of respiratory medications   Goals Progress/Improvement seen   Yes   Yes   Comments  Ms. Revoir was given a spacer and shown how to use it with her MDI's.  She seemed very impressed with the whistle feature on the spacer and said that it would help her to take her medication correctly..   Ms Heinz has her MDI's arranged by her chair and knows when to take her inhalers. Her daughter also helps with her schedule. She is using her oxygen at home and sith some activity.     07/08/15 1306           Increase Aerobic Exercise and Physical Activity   Goals Progress/Improvement seen  Yes       Comments Is doing Nusteph T4 but a little tired today but she feels she is better than when she first started in [facility]          Personal Goals Discharge (Final Personal Goals and Risk Factors Review):      Goals and Risk Factor Review - 07/08/15 1306    Increase Aerobic Exercise and Physical Activity   Goals Progress/Improvement seen  Yes   Comments Is doing Nusteph T4 but a little tired today but she feels she is better than when she first started in [facility]      Comments: 30 review note day

## 2015-08-02 LAB — GLUCOSE, CAPILLARY
GLUCOSE-CAPILLARY: 145 mg/dL — AB (ref 65–99)
Glucose-Capillary: 149 mg/dL — ABNORMAL HIGH (ref 65–99)
Glucose-Capillary: 159 mg/dL — ABNORMAL HIGH (ref 65–99)
Glucose-Capillary: 144 mg/dL — ABNORMAL HIGH (ref 65–99)

## 2015-08-03 LAB — CULTURE, BLOOD (ROUTINE X 2)
CULTURE: NO GROWTH
Culture: NO GROWTH

## 2015-08-03 LAB — GLUCOSE, CAPILLARY
GLUCOSE-CAPILLARY: 147 mg/dL — AB (ref 65–99)
Glucose-Capillary: 173 mg/dL — ABNORMAL HIGH (ref 65–99)

## 2015-08-04 LAB — GLUCOSE, CAPILLARY
GLUCOSE-CAPILLARY: 232 mg/dL — AB (ref 65–99)
GLUCOSE-CAPILLARY: 141 mg/dL — AB (ref 65–99)
GLUCOSE-CAPILLARY: 131 mg/dL — AB (ref 65–99)
GLUCOSE-CAPILLARY: 183 mg/dL — AB (ref 65–99)
Glucose-Capillary: 149 mg/dL — ABNORMAL HIGH (ref 65–99)

## 2015-08-05 ENCOUNTER — Encounter
Admission: RE | Admit: 2015-08-05 | Discharge: 2015-08-05 | Disposition: A | Discharge status: Home / Self Care | Payer: Medicare Other | Source: Ambulatory Visit | Attending: Internal Medicine | Admitting: Internal Medicine

## 2015-08-05 LAB — GLUCOSE, CAPILLARY: GLUCOSE-CAPILLARY: 151 mg/dL — AB (ref 65–99)

## 2015-08-06 LAB — GLUCOSE, CAPILLARY
GLUCOSE-CAPILLARY: 139 mg/dL — AB (ref 65–99)
Glucose-Capillary: 165 mg/dL — ABNORMAL HIGH (ref 65–99)
Glucose-Capillary: 167 mg/dL — ABNORMAL HIGH (ref 65–99)

## 2015-08-07 LAB — GLUCOSE, CAPILLARY
GLUCOSE-CAPILLARY: 146 mg/dL — AB (ref 65–99)
GLUCOSE-CAPILLARY: 158 mg/dL — AB (ref 65–99)
Glucose-Capillary: 148 mg/dL — ABNORMAL HIGH (ref 65–99)

## 2015-08-08 ENCOUNTER — Other Ambulatory Visit
Admission: RE | Admit: 2015-08-08 | Discharge: 2015-08-08 | Disposition: A | Discharge status: Home / Self Care | Payer: No Typology Code available for payment source | Source: Skilled Nursing Facility | Attending: Internal Medicine | Admitting: Internal Medicine

## 2015-08-08 DIAGNOSIS — I5032 Chronic diastolic (congestive) heart failure: Secondary | ICD-10-CM | POA: Insufficient documentation

## 2015-08-08 LAB — COMPREHENSIVE METABOLIC PANEL
ALBUMIN: 2.8 g/dL — AB (ref 3.5–5.0)
ALT: 20 U/L (ref 14–54)
AST: 19 U/L (ref 15–41)
Alkaline Phosphatase: 120 U/L (ref 38–126)
Anion gap: 7 (ref 5–15)
BUN: 21 mg/dL — AB (ref 6–20)
CHLORIDE: 88 mmol/L — AB (ref 101–111)
CO2: 33 mmol/L — AB (ref 22–32)
CREATININE: 0.59 mg/dL (ref 0.44–1.00)
Calcium: 8.5 mg/dL — ABNORMAL LOW (ref 8.9–10.3)
GFR calc Af Amer: >60 mL/min (ref >60)
GFR calc non Af Amer: >60 mL/min (ref >60)
GLUCOSE: 150 mg/dL — AB (ref 65–99)
POTASSIUM: 4.4 mmol/L (ref 3.5–5.1)
SODIUM: 128 mmol/L — AB (ref 135–145)
Total Bilirubin: 0.3 mg/dL (ref 0.3–1.2)
Total Protein: 5.4 g/dL — ABNORMAL LOW (ref 6.5–8.1)

## 2015-08-08 LAB — GLUCOSE, CAPILLARY
GLUCOSE-CAPILLARY: 127 mg/dL — AB (ref 65–99)
GLUCOSE-CAPILLARY: 177 mg/dL — AB (ref 65–99)
GLUCOSE-CAPILLARY: 195 mg/dL — AB (ref 65–99)
GLUCOSE-CAPILLARY: 195 mg/dL — AB (ref 65–99)
Glucose-Capillary: 125 mg/dL — ABNORMAL HIGH (ref 65–99)
Glucose-Capillary: 171 mg/dL — ABNORMAL HIGH (ref 65–99)

## 2015-08-08 LAB — CBC
HCT: 27.1 % — ABNORMAL LOW (ref 35.0–47.0)
Hemoglobin: 8.8 g/dL — ABNORMAL LOW (ref 12.0–16.0)
MCH: 28.4 pg (ref 26.0–34.0)
MCHC: 32.3 g/dL (ref 32.0–36.0)
MCV: 88 fL (ref 80.0–100.0)
PLATELETS: 399 10*3/uL (ref 150–440)
RBC: 3.08 MIL/uL — AB (ref 3.80–5.20)
RDW: 17.9 % — AB (ref 11.5–14.5)
WBC: 9.9 10*3/uL (ref 3.6–11.0)

## 2015-08-09 LAB — GLUCOSE, CAPILLARY
GLUCOSE-CAPILLARY: 147 mg/dL — AB (ref 65–99)
GLUCOSE-CAPILLARY: 136 mg/dL — AB (ref 65–99)

## 2015-08-19 ENCOUNTER — Ambulatory Visit (INDEPENDENT_AMBULATORY_CARE_PROVIDER_SITE_OTHER): Payer: Medicare Other | Admitting: Cardiovascular Disease

## 2015-08-19 ENCOUNTER — Encounter: Payer: Self-pay | Admitting: Cardiovascular Disease

## 2015-08-19 ENCOUNTER — Telehealth: Payer: Self-pay | Admitting: *Deleted

## 2015-08-19 VITALS — BP 130/80 | HR 67 | Ht 61.0 in | Wt 170.0 lb

## 2015-08-19 DIAGNOSIS — I5033 Acute on chronic diastolic (congestive) heart failure: Secondary | ICD-10-CM

## 2015-08-19 DIAGNOSIS — R0602 Shortness of breath: Secondary | ICD-10-CM | POA: Diagnosis not present

## 2015-08-19 DIAGNOSIS — D649 Anemia, unspecified: Secondary | ICD-10-CM

## 2015-08-19 DIAGNOSIS — Z23 Encounter for immunization: Secondary | ICD-10-CM | POA: Diagnosis not present

## 2015-08-19 DIAGNOSIS — E871 Hypo-osmolality and hyponatremia: Secondary | ICD-10-CM

## 2015-08-19 DIAGNOSIS — R079 Chest pain, unspecified: Secondary | ICD-10-CM

## 2015-08-19 DIAGNOSIS — J449 Chronic obstructive pulmonary disease, unspecified: Secondary | ICD-10-CM

## 2015-08-19 MED ORDER — BUMETANIDE 2 MG PO TABS: 2.0000 mg | ORAL_TABLET | Freq: Two times a day (BID) | ORAL | Status: DC | PRN

## 2015-08-19 NOTE — Telephone Encounter (Signed)
Spoke w/ pt.  Advised her that I notified Dr. Mariah MillingGollan and he is afraid that Urgent Care will send pt back to hospital, as she was just released on 07/29/15. Asked her if she would prefer to swing by here and let Dr. Mariah MillingGollan take a look at her, she is agreeable to this and will be here in about an hour.  Pt is very appreciative.

## 2015-08-19 NOTE — Patient Instructions (Addendum)
You are full of fluid  Take bumex 2 mg twice a day until leg swelling is down  Please cut back on the pepsi!!  Please call us if you have new issues that need to be addressed before your next appt.  Your physician wants you to follow-up in: 2 weeks

## 2015-08-19 NOTE — Telephone Encounter (Signed)
Pt calling stating that she is going to take Pt to the walk in clinic at [facility]  She is calling just to give Dr Mariah MillingGollan an update So last year Dr. Burnadette PopLinthavong took pt of Spironolactone But when pt went on vacation and ended up in hospital for pneumonia and a bruised Sternum  They were giving pt that medication along with the Bumex  She is taking pt to the walk in clinic Just wanted us to know

## 2015-08-19 NOTE — Assessment & Plan Note (Signed)
Shortness of breath likely multifactorial including  Acute on chronic diastolic CHF , anemia , COPD

## 2015-08-19 NOTE — Assessment & Plan Note (Signed)
Stable symptoms , scant crackles at the bases

## 2015-08-19 NOTE — Assessment & Plan Note (Signed)
She may benefit from iron infusion given her worsening anemia  This is also likely contributing to her shortness of breath and leg edema  she has appointment December 1 at 2:00 with Nelva NayMelissa Corcoran  Will see if they can see her earlier for further evaluation

## 2015-08-19 NOTE — Assessment & Plan Note (Signed)
She's been drinking significant Pepsi, no titration of her Bumex while at rehabilitation with dramatic weight gain, leg edema  Recommended she take Bumex 2 mg twice a day until her edema starts to improve  once edema dramatically improve, would decrease down to 1 mg twice a day  We will see her in 1 week's time

## 2015-08-19 NOTE — Progress Notes (Signed)
Patient ID: Catherine Holmes, female    DOB: [birth year], 81 y.o.   MRN: [id number]  HPI Comments: 81 year-old woman with a history of DM,  peripheral vascular disease, 60 to 70% carotid arterial disease, moderate arterial disease of the lower extremities, status post bilateral iliac stenting, moderate renal stenosis, hyperlipidemia, history of severe  hypertension with labile blood pressures, history of episodic shortness of breath and chest squeezing relieved with albuterol/nebs who presents for routine followup. She has a sulfa allergy to loop diuretics. History of falls.   left carotid arterial stenosis estimated at 60-79% disease. She has had several admissions for diastolic CHF, typically from dietary indiscretion and periodic noncompliance with diuretics She was admitted on 11/02/2013 , discharged on 11/16/2013  admitted on 01/24/2014, discharge from 01/27/2014 for acute respiratory failure secondary to COPD History of anemia followed by hematology She presents today for follow-up of her chronic diastolic CHF   recently hospitalized  September 2016at an outside facility for pneumonia and sepsis.  she underwent the chest compressions CPR for what that looks like a vasovagal episode and had a sternal fracture. We presented to the hospital early October 2016 with hypoxia, shortness of breath , inadequate pain control /Chest pain. sodium level of 129, mild anemia with hemoglobin level of 10.5.    CT scan of the chest showed no pulmonary embolism. However, is based consolidation in both lung bases, more on the right than on the left with small right pleural effusion was noted, as well as stable collapse of T8 and T12 vertebral bodies  she was treated for pneumonia , discharged to [facility] rehabilitation   During her time in [place], she had dramatic weight gain, lower extremity edema  We received a call us morning that she was going to acute care.  We have recommended that she come to the office for  urgent evaluation  On evaluation today, she has 2+ pitting edema to her thighs.  She  Reports mildshortness of breath,  abdominal bloating  Significant sores and ecchymoses of her legs.  Compression wraps in place  She has been taking Bumex sparingly  she feels that she needs a iron infusion.  Hemoglobin down to 8.8.   EKG on today's visit shows normal sinus rhythm  With APCs, rate 67 bpm  Other past medical history Recent hospital admission for acute on chronic diastolic CHF. Improvement of her symptoms with aggressive diuresis. Readmitted this past week to the hospital with hypotension. She has persistent anemia  had iron infusions in the past.   Followed by Dr. Cherylann Ratel for her kidney issues  She stopped her amiodarone out of concern for possible pulmonary complications. Denies having any palpitations She does report having rare episodes of near syncope wall in a sitting position. Comes across her like a wave it resolves relatively quickly  She was in the hospital 05/05/2014 with hypertensive urgency, hyponatremia. Blood pressure is 201/98 and she was put back on her outpatient regiment with improvement of her symptoms. She suffered a fractured hip on 05/07/2014 and we were consulted for preop evaluation. Found to have elevated at her pressures at 65. She was discharged on 05/17/2014 EKGs from her hospital course showed she had atrial fibrillation on 05/09/2014  Previous Echocardiogram in the hospital showed normal ejection fraction, moderate to severe, hypertension, moderately dilated left atrium  Has a history of large hiatal hernia She is not able to tolerate statins. She does handle zetia without significant side effects. previously encouraged to take red  yeast rice.   Previous weight October 2014 was 148 pounds.  weight at the end of December 2014 was 160 pounds, she had increasing shortness of breath, had not been taking Bumex    in the [facility] 2012 for general  malaise, severe hypertension, shortness of breath, chest pain. She had a stress test, lexiscan that showed no significant ischemia. Significant shortness of breath with lexiscan.   hospital admission on 01/08/2012 for shortness of breath and syncope. She was found to have significant anemia. Treated with Lasix for diastolic CHF.  Underlying severe left carotid arterial disease estimated at 70%.   Prior followup with Dr. Welton FlakesKhan in pulmonary with numerous CT scans, MRIs, PFTs, sleep study. She was told that she had hypoxemia overnight and was started on nighttime oxygen.    hospitalization 05/27/2013 for acute on chronic diastolic CHF. She was treated with IV Bumex twice a day with improvement of her symptoms. Getting factor was her anemia with hematocrit of 25.    Outpatient Encounter Prescriptions as of 08/19/2015  Medication Sig  . albuterol (PROAIR HFA) 108 (90 BASE) MCG/ACT inhaler Inhale 2 puffs into the lungs every 6 (six) hours as needed.  Marland Kitchen. albuterol (PROVENTIL) (2.5 MG/3ML) 0.083% nebulizer solution Take 2.5 mg by nebulization every 6 (six) hours as needed for wheezing or shortness of breath.   Marland Kitchen. arformoterol (BROVANA) 15 MCG/2ML NEBU Take 2 mLs (15 mcg total) by nebulization 2 (two) times daily.  Marland Kitchen. aspirin EC 81 MG tablet Take 81 mg by mouth daily.  . beta carotene w/minerals (OCUVITE) tablet Take 1 tablet by mouth 2 (two) times daily.  . bumetanide (BUMEX) 2 MG tablet Take 1 tablet (2 mg total) by mouth 2 (two) times daily as needed. Take 1 tab daily  . calcium carbonate (OS-CAL) 600 MG TABS tablet Take 600 mg by mouth daily with breakfast.  . Cholecalciferol (VITAMIN D-3 PO) Take 1,000 Units by mouth.  . citalopram (CELEXA) 20 MG tablet Take 20 mg by mouth daily.  . diphenhydramine-acetaminophen (TYLENOL PM) 25-500 MG TABS Take 1 tablet by mouth at bedtime as needed.  . doxazosin (CARDURA) 4 MG tablet Take 1 tablet (4 mg total) by mouth daily.  Marland Kitchen. ezetimibe (ZETIA) 10 MG tablet Take 5  mg by mouth daily.  . fluticasone (FLONASE) 50 MCG/ACT nasal spray Place 1 spray into both nostrils daily.  Marland Kitchen. gabapentin (NEURONTIN) 100 MG capsule Take 100 mg by mouth 2 (two) times daily.   . hydrALAZINE (APRESOLINE) 50 MG tablet Take 50 mg by mouth as needed.   Marland Kitchen. HYDROcodone-acetaminophen (NORCO/VICODIN) 5-325 MG tablet Take 1-2 tablets by mouth 2 (two) times daily as needed for moderate pain.  Marland Kitchen. lidocaine (LIDODERM) 5 % Place 1 patch onto the skin every 12 (twelve) hours. Remove & Discard patch within 12 hours or as directed by MD  . methylPREDNISolone (MEDROL DOSEPAK) 4 MG TBPK tablet follow package directions  . mometasone-formoterol (DULERA) 100-5 MCG/ACT AERO Inhale 2 puffs into the lungs 2 (two) times daily.   . Multiple Vitamin (MULTIVITAMIN) tablet Take 1 tablet by mouth daily.    . mupirocin ointment (BACTROBAN) 2 % Apply topically.  . nebivolol (BYSTOLIC) 5 MG tablet Take 1 tablet (5 mg total) by mouth daily.  . nitroGLYCERIN (NITROSTAT) 0.4 MG SL tablet Place 1 tablet (0.4 mg total) under the tongue every 5 (five) minutes as needed for chest pain.  . potassium chloride (K-DUR,KLOR-CON) 10 MEQ tablet Take 0.5 tablets (5 mEq total) by mouth  2 (two) times daily as needed. Patient takes this when she takes Bumex.  Marland Kitchen spironolactone (ALDACTONE) 50 MG tablet Take 50 mg by mouth daily as needed.  . tiotropium (SPIRIVA) 18 MCG inhalation capsule Place 1 capsule (18 mcg total) into inhaler and inhale daily.  . [DISCONTINUED] bumetanide (BUMEX) 1 MG tablet Take 1 tablet (1 mg total) by mouth daily. Take 1 tab daily (Patient taking differently: Take 1 mg by mouth as needed. Take 1 tab daily)  . [DISCONTINUED] carvedilol (COREG) 3.125 MG tablet Take 3.125 mg by mouth 2 (two) times daily with a meal.  . [DISCONTINUED] clindamycin (CLEOCIN) 300 MG capsule Take 300 mg by mouth 4 (four) times daily.  . [DISCONTINUED] oxyCODONE (OXY IR/ROXICODONE) 5 MG immediate release tablet Take 1 tablet (5 mg  total) by mouth every 4 (four) hours as needed for moderate pain. (Patient not taking: Reported on 08/19/2015)  . [DISCONTINUED] spironolactone (ALDACTONE) 25 MG tablet Take 1 tablet (25 mg total) by mouth daily. (Patient not taking: Reported on 08/19/2015)   Facility-Administered Encounter Medications as of 08/19/2015  Medication  . 0.9 %  sodium chloride infusion  . alteplase (CATHFLO ACTIVASE) injection 2 mg  . ferumoxytol (FERAHEME) 510 mg in sodium chloride 0.9 % 100 mL IVPB  . heparin lock flush 100 unit/mL  . heparin lock flush 100 unit/mL  . sodium chloride 0.9 % injection 10 mL  . sodium chloride 0.9 % injection 3 mL   Social hx Social History   Social History  . Marital Status: Divorced    Spouse Name: N/A  . Number of Children: 2  . Years of Education: N/A   Occupational History  . Retired     former IT sales professional   Social History Main Topics  . Smoking status: Former Smoker -- 0.50 packs/day for 30 years    Types: Cigarettes    Quit date: 10/22/1994  . Smokeless tobacco: Never Used  . Alcohol Use: Yes  . Drug Use: No  . Sexual Activity: Not on file   Other Topics Concern  . Not on file   Social History Narrative    Review of Systems  Constitutional: Positive for fatigue and unexpected weight change.  Respiratory: Positive for shortness of breath.   Cardiovascular: Positive for leg swelling.  Gastrointestinal: Negative.   Musculoskeletal: Positive for arthralgias and gait problem.  Neurological: Positive for weakness.  Hematological: Negative.   Psychiatric/Behavioral: Negative.   All other systems reviewed and are negative.   BP 130/80 mmHg  Pulse 67  Ht  (1.549 m)  Wt 170 lb (77.111 kg)  BMI 32.14 kg/m2  Physical Exam  Constitutional: She is oriented to person, place, and time. She appears well-developed and well-nourished.  Appears pale,  Presenting in wheelchair  HENT:  Head: Normocephalic.  Nose: Nose normal.  Mouth/Throat:  Oropharynx is clear and moist.  Eyes: Conjunctivae are normal. Pupils are equal, round, and reactive to light.  Neck: Normal range of motion. Neck supple. No JVD present.  Cardiovascular: Normal rate, regular rhythm, S1 normal, S2 normal, normal heart sounds and intact distal pulses.  Exam reveals no gallop and no friction rub.   No murmur heard.  2+ pitting edema to the thighs , regions of ecchymosis and sores on her legs , compression hose in place  Pulmonary/Chest: Effort normal and breath sounds normal. No respiratory distress. She has no wheezes. She has no rales. She exhibits no tenderness.  Abdominal: Soft. Bowel sounds are normal. She exhibits no  distension. There is no tenderness.  Musculoskeletal: Normal range of motion. She exhibits no edema or tenderness.  Lymphadenopathy:    She has no cervical adenopathy.  Neurological: She is alert and oriented to person, place, and time. Coordination normal.  Skin: Skin is warm and dry. No rash noted. No erythema.  Psychiatric: She has a normal mood and affect. Her behavior is normal. Judgment and thought content normal.    Assessment and Plan  Nursing note and vitals reviewed.

## 2015-08-19 NOTE — Assessment & Plan Note (Signed)
Suggested that she restrict her free water for now  We'll check BMP in 1 week

## 2015-08-22 ENCOUNTER — Telehealth: Payer: Self-pay | Admitting: *Deleted

## 2015-08-22 NOTE — Telephone Encounter (Signed)
Pt daughter calling stating Dr Mariah MillingGollan told them to call us and give an update. Pt ankle and feet are still puffy, little bit of leg too.  So not sure if they need to up the medication  The last one she took yesterday it didn't do much.  Bumax 2 mg 2 x a day Also stating that since patient arm is not hurting not sure if the flu shot work.  Was told by nurse that if it did not hurt that it did not take or something she states. Please advise.

## 2015-08-22 NOTE — Telephone Encounter (Signed)
Pt is sched to see you on Friday.

## 2015-08-22 NOTE — Telephone Encounter (Signed)
Would stay on the Bumex 2 mg twice per day, morning and after lunch Can she check her weight daily? Previously had swelling into the thighs? Does this thighs swelling getting better Make sure she is taking potassium twice a day

## 2015-08-23 NOTE — Telephone Encounter (Signed)
Spoke w/ Eunice Blaseebbie.  Advised her of Dr. Windell HummingbirdGollan's recommendation.  She is agreeable and will start weighing pt and recording the numbers. She reports that pt is not feeling well this am, is a bit nauseous.  She has a dentist appt that she does think she will be able to keep. Asked her to call back w/ any concerns before her appt.

## 2015-08-24 ENCOUNTER — Telehealth: Payer: Self-pay | Admitting: Respiratory Therapy

## 2015-08-24 NOTE — Telephone Encounter (Signed)
Catherine Holmes called and requested to be discharged [facility]. Since her hospital admission for pneumonia, she is now at home and will have home therapy. She is unable to participate in [facility] at this time.

## 2015-08-26 ENCOUNTER — Telehealth: Payer: Self-pay

## 2015-08-26 ENCOUNTER — Ambulatory Visit: Payer: Medicare Other | Admitting: Cardiovascular Disease

## 2015-08-26 NOTE — Telephone Encounter (Signed)
Pt's appt today was cancelled, as our office was told that pt had a stomach bug.  Spoke w/ Catherine Holmes. She states that pt does not have a stomach bug, that she becomes nauseous every morning after she eats.   She takes Zantac daily, but she is unsure as to why she takes this.  She reports that pt's thighs are no longer swollen, all of her swelling is now below her knee. Pt does not feel good and has not taken her second dose of lasix today.  Catherine BlaseDebbie feels that the rest of her swelling will resolve over the weekend if she continues to take her meds as prescribed.  She will call on Monday to be worked in when she has another appt in the building (per Dr. Mariah MillingGollan, we will work her in to be seen on Monday for a quick visit).  She is appreciative of the call and will let us know what time her other appt is on Monday to see if she can just come here when they are done.

## 2015-08-29 ENCOUNTER — Other Ambulatory Visit: Payer: Self-pay | Admitting: Hematology and Oncology

## 2015-08-30 ENCOUNTER — Encounter: Payer: Self-pay | Admitting: Respiratory Therapy

## 2015-08-30 DIAGNOSIS — I509 Heart failure, unspecified: Secondary | ICD-10-CM

## 2015-08-30 DIAGNOSIS — J449 Chronic obstructive pulmonary disease, unspecified: Secondary | ICD-10-CM

## 2015-08-30 NOTE — Progress Notes (Signed)
Pulmonary Individual Treatment Plan  Patient Details  Name: Catherine Holmes MRN: [id number] Date of Birth: [birth year] Referring Provider:  Dr Julien Nordmann  Initial Encounter Date: 04/19/2015  Visit Diagnosis: COPD, mild (HCC)  Chronic congestive heart failure, unspecified congestive heart failure type (HCC)  Patient's Home Medications on Admission:  Current outpatient prescriptions:    albuterol (PROAIR HFA) 108 (90 BASE) MCG/ACT inhaler, Inhale 2 puffs into the lungs every 6 (six) hours as needed., Disp: 3 Inhaler, Rfl: 0   albuterol (PROVENTIL) (2.5 MG/3ML) 0.083% nebulizer solution, Take 2.5 mg by nebulization every 6 (six) hours as needed for wheezing or shortness of breath. , Disp: , Rfl:    arformoterol (BROVANA) 15 MCG/2ML NEBU, Take 2 mLs (15 mcg total) by nebulization 2 (two) times daily., Disp: 120 mL, Rfl: 4   aspirin EC 81 MG tablet, Take 81 mg by mouth daily., Disp: , Rfl:    beta carotene w/minerals (OCUVITE) tablet, Take 1 tablet by mouth 2 (two) times daily., Disp: , Rfl:    bumetanide (BUMEX) 2 MG tablet, Take 1 tablet (2 mg total) by mouth 2 (two) times daily as needed. Take 1 tab daily, Disp: 60 tablet, Rfl: 11   calcium carbonate (OS-CAL) 600 MG TABS tablet, Take 600 mg by mouth daily with breakfast., Disp: , Rfl:    Cholecalciferol (VITAMIN D-3 PO), Take 1,000 Units by mouth., Disp: , Rfl:    citalopram (CELEXA) 20 MG tablet, Take 20 mg by mouth daily., Disp: , Rfl:    diphenhydramine-acetaminophen (TYLENOL PM) 25-500 MG TABS, Take 1 tablet by mouth at bedtime as needed., Disp: , Rfl:    doxazosin (CARDURA) 4 MG tablet, Take 1 tablet (4 mg total) by mouth daily., Disp: , Rfl:    ezetimibe (ZETIA) 10 MG tablet, Take 5 mg by mouth daily., Disp: , Rfl:    fluticasone (FLONASE) 50 MCG/ACT nasal spray, Place 1 spray into both nostrils daily., Disp: , Rfl:    gabapentin (NEURONTIN) 100 MG capsule, Take 100 mg by mouth 2 (two) times daily. , Disp: , Rfl:     hydrALAZINE (APRESOLINE) 50 MG tablet, Take 50 mg by mouth as needed. , Disp: , Rfl:    HYDROcodone-acetaminophen (NORCO/VICODIN) 5-325 MG tablet, Take 1-2 tablets by mouth 2 (two) times daily as needed for moderate pain., Disp: , Rfl:    lidocaine (LIDODERM) 5 %, Place 1 patch onto the skin every 12 (twelve) hours. Remove & Discard patch within 12 hours or as directed by MD, Disp: , Rfl:    methylPREDNISolone (MEDROL DOSEPAK) 4 MG TBPK tablet, follow package directions, Disp: 21 tablet, Rfl: 0   mometasone-formoterol (DULERA) 100-5 MCG/ACT AERO, Inhale 2 puffs into the lungs 2 (two) times daily. , Disp: , Rfl:    Multiple Vitamin (MULTIVITAMIN) tablet, Take 1 tablet by mouth daily.  , Disp: , Rfl:    mupirocin ointment (BACTROBAN) 2 %, Apply topically., Disp: , Rfl:    nebivolol (BYSTOLIC) 5 MG tablet, Take 1 tablet (5 mg total) by mouth daily., Disp: 30 tablet, Rfl: 0   nitroGLYCERIN (NITROSTAT) 0.4 MG SL tablet, Place 1 tablet (0.4 mg total) under the tongue every 5 (five) minutes as needed for chest pain., Disp: 25 tablet, Rfl: 6   potassium chloride (K-DUR,KLOR-CON) 10 MEQ tablet, Take 0.5 tablets (5 mEq total) by mouth 2 (two) times daily as needed. Patient takes this when she takes Bumex., Disp: 60 tablet, Rfl: 3   spironolactone (ALDACTONE) 50 MG tablet, Take 50 mg  by mouth daily as needed., Disp: , Rfl:    tiotropium (SPIRIVA) 18 MCG inhalation capsule, Place 1 capsule (18 mcg total) into inhaler and inhale daily., Disp: 90 capsule, Rfl: 0 No current facility-administered medications for this visit.  Facility-Administered Medications Ordered in Other Visits:    0.9 %  sodium chloride infusion, , Intravenous, Once, Rosey Bath, MD   alteplase (CATHFLO ACTIVASE) injection 2 mg, 2 mg, Intracatheter, Once PRN, Rosey Bath, MD   ferumoxytol (FERAHEME) 510 mg in sodium chloride 0.9 % 100 mL IVPB, 510 mg, Intravenous, Once, Rosey Bath, MD   heparin lock flush  100 unit/mL, 500 Units, Intracatheter, Once PRN, Rosey Bath, MD   heparin lock flush 100 unit/mL, 250 Units, Intracatheter, Once PRN, Rosey Bath, MD   sodium chloride 0.9 % injection 10 mL, 10 mL, Intracatheter, PRN, Rosey Bath, MD   sodium chloride 0.9 % injection 3 mL, 3 mL, Intravenous, Once PRN, Rosey Bath, MD  Past Medical History: Past Medical History  Diagnosis Date   Essential hypertension    Diabetes mellitus type II, controlled (HCC)    COPD (chronic obstructive pulmonary disease) (HCC)    Osteoporosis    Carotid arterial disease (HCC)     a. 2012 Carotid dopplers: 40-59% R ICA, 60-79% L ICA   [place]    Arthralgia    Sjoegren syndrome (HCC)    TIA (transient ischemic attack)    Emphysema    MVP (mitral valve prolapse)    Chronic diastolic CHF (congestive heart failure) (HCC)     a. 08/2011 Echo: EF 65-70%, mild LVH, mod dil LA, mildly dil RA, mild MR, mild-mod AoV Sclerosis w/o stenosis, mod-sev elev PA pressures, mild TR.   Acid reflux    Diverticulitis    Anemia    History of pneumonia    Syncope and collapse    Anemia    Hip fracture, left (HCC)    Hyperlipidemia    Hiatal hernia    Chest pain     a. 08/2011 MV: EF 74%, no ischemia.   Noncompliance     a. h/o not taking bumex as Rx.   PAF (paroxysmal atrial fibrillation) (HCC)     a. CHA2DS2VASc = 8 ->No OAC 2/2 falls.    Tobacco Use: History  Smoking status   Former Smoker -- 0.50 packs/day for 30 years   Types: Cigarettes   Quit date: 10/22/1994  Smokeless tobacco   Never Used    Labs: Recent Review Flowsheet Data    There is no flowsheet data to display.         POCT Glucose      04/27/15 1130 05/04/15 1125         POCT Blood Glucose   Pre-Exercise 115 mg/dL       Post-Exercise 244 mg/dL       Pre-Exercise #2  99 mg/dL      Post-Exercise #2  109 mg/dL      Pre-Exercise #3  010 mg/dL      Post-Exercise #3  102 mg/dL          ADL UCSD:     ADL UCSD      04/19/15 1305       ADL UCSD   ADL Phase Entry     SOB Score total 92     Rest 1     Walk 4     Stairs 5     Bath 4  Dress 5     Shop 5         Pulmonary Function Assessment:     Pulmonary Function Assessment - 04/19/15 1130    Pulmonary Function Tests   FVC% 36 %   FEV1% 35 %   FEV1/FVC Ratio 71.7   Breath   Bilateral Breath Sounds Clear;Decreased   Shortness of Breath Yes      Exercise Target Goals:    Exercise Program Goal: Individual exercise prescription set with THRR, safety & activity barriers. Participant demonstrates ability to understand and report RPE using BORG scale, to self-measure pulse accurately, and to acknowledge the importance of the exercise prescription.  Exercise Prescription Goal: Starting with aerobic activity 30 plus minutes a day, 3 days per week for initial exercise prescription. Provide home exercise prescription and guidelines that participant acknowledges understanding prior to discharge.  Activity Barriers & Risk Stratification:     Activity Barriers & Risk Stratification - 04/19/15 1130    Activity Barriers & Risk Stratification   Activity Barriers Shortness of Breath;Balance Concerns;Deconditioning;Assistive Device   Risk Stratification Moderate      6 Minute Walk:     6 Minute Walk      04/19/15 1420       6 Minute Walk   Phase Initial     Distance 180 feet     Walk Time 3 minutes     Resting HR 67 bpm     Resting BP 122/70 mmHg     Max Ex. HR 82 bpm     Max Ex. BP 132/74 mmHg     RPE 15     Perceived Dyspnea  4     Symptoms No        Initial Exercise Prescription:     Initial Exercise Prescription - 04/19/15 1500    Date of Initial Exercise Prescription   Date 04/19/15   Treadmill   MPH 1   Grade 0   Minutes 10   Recumbant Bike   Level 1   RPM 40   Watts 20   Minutes 10   NuStep   Level 2   Watts 40   Minutes 10   Arm Ergometer   Level 1   Watts  10   Minutes 10   REL-XR   Level 1   Watts 40   Minutes 10   Prescription Details   Frequency (times per week) 3   Duration Progress to 30 minutes of continuous aerobic without signs/symptoms of physical distress   Intensity   THRR REST +  30   Ratings of Perceived Exertion 11-15   Perceived Dyspnea 2-4   Progression Continue progressive overload as per policy without signs/symptoms or physical distress.   Resistance Training   Training Prescription Yes   Weight 1   Reps 10-12      Exercise Prescription Changes:     Exercise Prescription Changes      04/27/15 1500 04/29/15 1200 05/04/15 1500 05/09/15 1500 06/06/15 1500   Exercise Review   Progression   Yes Yes Yes   Response to Exercise   Blood Pressure (Admit) 118/70 mmHg 118/70 mmHg 120/78 mmHg 120/78 mmHg 100/60 mmHg   Blood Pressure (Exercise) 130/70 mmHg 130/70 mmHg 130/68 mmHg 130/68 mmHg 144/74 mmHg   Blood Pressure (Exit) 120/62 mmHg 120/62 mmHg 110/66 mmHg 110/66 mmHg 122/72 mmHg   Heart Rate (Admit) 68 bpm 68 bpm 65 bpm 65 bpm 71 bpm   Heart Rate (Exercise) 105 bpm 105 bpm  78 bpm 78 bpm 70 bpm   Heart Rate (Exit) 90 bpm 90 bpm 63 bpm 63 bpm 90 bpm   Oxygen Saturation (Admit) 92 % 92 % 93 %  2l/m [place] 93 %  2l/m [place] 96 %  2l/m [place]   Oxygen Saturation (Exercise) 90 % 90 % 88 % 88 % 93 %  2l/m   Oxygen Saturation (Exit) 95 % 95 % 96 % 96 % 91 %   Rating of Perceived Exertion (Exercise) Perceived Dyspnea (Exercise) Resistance Training   Training Prescription Yes Yes Yes Yes Yes   Weight Treadmill   MPH 0.8 0.8 1 1 1    Grade 0 0 0 0 0   Minutes Recumbant Bike   Level BioStep   RPM 24 24 40 40 30   Minutes NuStep   Level Watts 20 20 40 40 40   Minutes Recumbant Elliptical   Level  2  BIOSTEP      Watts  30      Minutes  10        07/01/15 1200 07/04/15 1500 08/22/15 1000       Exercise  Review   Progression Yes Yes No     Response to Exercise   Blood Pressure (Admit) 100/60 mmHg 122/70 mmHg      Blood Pressure (Exercise) 144/74 mmHg 126/74 mmHg      Blood Pressure (Exit) 122/72 mmHg       Heart Rate (Admit) 71 bpm 71 bpm      Heart Rate (Exercise) 70 bpm 80 bpm      Heart Rate (Exit) 90 bpm       Oxygen Saturation (Admit) 96 %  2l/m [place] 97 %  2l/m [place]      Oxygen Saturation (Exercise) 93 %  2l/m 96 %  2l/m      Oxygen Saturation (Exit) 91 %       Rating of Perceived Exertion (Exercise) 14 11      Perceived Dyspnea (Exercise) 4 2      Comments   patient has not attended since prior 30 day review.  no changes at this time.     Duration Progress to 30 minutes of continuous aerobic without signs/symptoms of physical distress Progress to 30 minutes of continuous aerobic without signs/symptoms of physical distress      Intensity THRR unchanged THRR unchanged      Progression Continue progressive overload as per policy without signs/symptoms or physical distress. Continue progressive overload as per policy without signs/symptoms or physical distress.      Resistance Training   Training Prescription Yes Yes      Weight 1 1      Reps 10-12 10-12      Treadmill   MPH 1       Grade 0 0      Minutes 10       Recumbant Bike   Level 2  BioStep 3  BioStep      RPM 30       Watts  35      Minutes 20 20      NuStep   Level 2 2  Watts 40 30      Minutes 20 20      Recumbant Elliptical   Level 2       Watts 30       Minutes 10       REL-XR   Level 2       Watts 40       Minutes 12          Discharge Exercise Prescription (Final Exercise Prescription Changes):     Exercise Prescription Changes - 08/22/15 1000    Exercise Review   Progression No   Response to Exercise   Comments patient has not attended since prior 30 day review.  no changes at this time.       Nutrition:  Target Goals: Understanding of nutrition guidelines, daily intake of sodium  1500mg , cholesterol 200mg , calories 30% from fat and 7% or less from saturated fats, daily to have 5 or more servings of fruits and vegetables.  Biometrics:     Pre Biometrics - 04/19/15 1424    Pre Biometrics   Height 5' (1.524 m)   Weight 165 lb (74.844 kg)   Waist Circumference 41 inches   Hip Circumference 45 inches   Waist to Hip Ratio 0.91 %   BMI (Calculated) 32.3       Nutrition Therapy Plan and Nutrition Goals:     Nutrition Therapy & Goals - 04/19/15 1312    Nutrition Therapy   Diet Ms Glockner would like to meet with the dietitian. her goal is to lose 15lbs.      Nutrition Discharge: Rate Your Plate Scores:   Psychosocial: Target Goals: Acknowledge presence or absence of depression, maximize coping skills, provide positive support system. Participant is able to verbalize types and ability to use techniques and skills needed for reducing stress and depression.  Initial Review & Psychosocial Screening:     Initial Psych Review & Screening - 04/19/15 1130    Barriers   Psychosocial barriers to participate in program There are no identifiable barriers or psychosocial needs.  Ms Mihok has good family support from her daughter who lives with Ms Hasley. Ms Nowak just lost her son in December 2015 and indicated depression over this loss.   Screening Interventions   Interventions Program counselor consult;Encouraged to exercise      Quality of Life Scores:   PHQ-9:     Recent Review Flowsheet Data    There is no flowsheet data to display.      Psychosocial Evaluation and Intervention:   Psychosocial Re-Evaluation:  Education: Education Goals: Education classes will be provided on a weekly basis, covering required topics. Participant will state understanding/return demonstration of topics presented.  Learning Barriers/Preferences:     Learning Barriers/Preferences - 04/19/15 1130    Learning Barriers/Preferences   Learning Barriers None    Learning Preferences Group Instruction;Individual Instruction;Pictoral;Skilled Demonstration;Written Material;Video;Verbal Instruction      Education Topics: Initial Evaluation Education: - Verbal, written and demonstration of respiratory meds, RPE/PD scales, oximetry and breathing techniques. Instruction on use of nebulizers and MDIs: cleaning and proper use, rinsing mouth with steroid doses and importance of monitoring MDI activations.          Pulmonary Rehab from 07/08/2015 in [facility]   Date  04/19/15   Educator  LB   Instruction Review Code  2- meets goals/outcomes      General Nutrition Guidelines/Fats and Fiber: -Group instruction provided by verbal, written material, models and posters  to present the general guidelines for heart healthy nutrition. Gives an explanation and review of dietary fats and fiber.      Pulmonary Rehab from 07/08/2015 in [facility]   Date  06/06/15   Educator  C. Perlie Gold, RD   Instruction Review Code  2- meets goals/outcomes      Controlling Sodium/Reading Food Labels: -Group verbal and written material supporting the discussion of sodium use in heart healthy nutrition. Review and explanation with models, verbal and written materials for utilization of the food label.   Exercise Physiology & Risk Factors: - Group verbal and written instruction with models to review the exercise physiology of the cardiovascular system and associated critical values. Details cardiovascular disease risk factors and the goals associated with each risk factor.      Pulmonary Rehab from 07/08/2015 in [facility]   Date  06/29/15   Educator  SW   Instruction Review Code  2- meets goals/outcomes      Aerobic Exercise & Resistance Training: - Gives group verbal and written discussion on the health impact of inactivity. On the components of aerobic and resistive  training programs and the benefits of this training and how to safely progress through these programs.      Pulmonary Rehab from 07/08/2015 in [facility]   Date  04/27/15   Educator  SW   Instruction Review Code  2- meets goals/outcomes      Flexibility, Balance, General Exercise Guidelines: - Provides group verbal and written instruction on the benefits of flexibility and balance training programs. Provides general exercise guidelines with specific guidelines to those with heart or lung disease. Demonstration and skill practice provided.   Stress Management: - Provides group verbal and written instruction about the health risks of elevated stress, cause of high stress, and healthy ways to reduce stress.   Depression: - Provides group verbal and written instruction on the correlation between heart/lung disease and depressed mood, treatment options, and the stigmas associated with seeking treatment.   Exercise & Equipment Safety: - Individual verbal instruction and demonstration of equipment use and safety with use of the equipment.      Pulmonary Rehab from 07/08/2015 in [facility]   Date  04/27/15   Educator  RM   Instruction Review Code  2- meets goals/outcomes      Infection Prevention: - Provides verbal and written material to individual with discussion of infection control including proper hand washing and proper equipment cleaning during exercise session.      Pulmonary Rehab from 07/08/2015 in [facility]   Date  04/27/15   Educator  LB   Instruction Review Code  2- meets goals/outcomes      Falls Prevention: - Provides verbal and written material to individual with discussion of falls prevention and safety.      Pulmonary Rehab from 07/08/2015 in [facility]   Date  04/19/15   Educator  LB   Instruction Review Code  2-  meets goals/outcomes      Diabetes: - Individual verbal and written instruction to review signs/symptoms of diabetes, desired ranges of glucose level fasting, after meals and with exercise. Advice that pre and post exercise glucose checks will be done for 3 sessions at entry of program.      Pulmonary Rehab from 07/08/2015 in [facility]   Date  06/24/15 [Know your numbers]   Educator  CE   Instruction Review Code  2- meets goals/outcomes      Chronic Lung Diseases: - Group verbal and written instruction to review new updates, new respiratory medications, new advancements in procedures and treatments. Provide informative websites and "800" numbers of self-education.      Pulmonary Rehab from 07/08/2015 in [facility]   Date  05/30/15   Educator  Hulen Luster, RRT   Instruction Review Code  2- meets goals/outcomes      Lung Procedures: - Group verbal and written instruction to describe testing methods done to diagnose lung disease. Review the outcome of test results. Describe the treatment choices: Pulmonary Function Tests, ABGs and oximetry.      Pulmonary Rehab from 07/08/2015 in [facility]   Date  05/27/15   Educator  sj   Instruction Review Code  2- meets goals/outcomes      Energy Conservation: - Provide group verbal and written instruction for methods to conserve energy, plan and organize activities. Instruct on pacing techniques, use of adaptive equipment and posture/positioning to relieve shortness of breath.      Pulmonary Rehab from 07/08/2015 in [facility]   Date  06/08/15   Educator  SW   Instruction Review Code  2- meets goals/outcomes      Triggers: - Group verbal and written instruction to review types of environmental controls: home humidity, furnaces, filters, dust mite/pet prevention, HEPA vacuums. To discuss  weather changes, air quality and the benefits of nasal washing.   Exacerbations: - Group verbal and written instruction to provide: warning signs, infection symptoms, calling MD promptly, preventive modes, and value of vaccinations. Review: effective airway clearance, coughing and/or vibration techniques. Create an Sport and exercise psychologist.   Oxygen: - Individual and group verbal and written instruction on oxygen therapy. Includes supplement oxygen, available portable oxygen systems, continuous and intermittent flow rates, oxygen safety, concentrators, and Medicare reimbursement for oxygen.      Pulmonary Rehab from 07/08/2015 in [facility]   Date  04/19/15   Educator  LB   Instruction Review Code  2- meets goals/outcomes      Respiratory Medications: - Group verbal and written instruction to review medications for lung disease. Drug class, frequency, complications, importance of spacers, rinsing mouth after steroid MDI's, and proper cleaning methods for nebulizers.      Pulmonary Rehab from 07/08/2015 in [facility]   Date  04/19/15   Educator  LB   Instruction Review Code  2- meets goals/outcomes      AED/CPR: - Group verbal and written instruction with the use of models to demonstrate the basic use of the AED with the basic ABC's of resuscitation.      Pulmonary Rehab from 07/08/2015 in [facility]   Date  06/03/15   Educator  CE   Instruction Review Code  2- meets goals/outcomes      Breathing Retraining: - Provides individuals verbal and written instruction on purpose, frequency, and proper technique of diaphragmatic breathing and pursed-lipped breathing. Applies individual practice skills.      Pulmonary Rehab from 07/08/2015 in [facility]   Date  04/27/15   Educator  LB   Instruction Review Code  2- meets goals/outcomes      Anatomy and  Physiology of the Lungs: - Group verbal and  written instruction with the use of models to provide basic lung anatomy and physiology related to function, structure and complications of lung disease.      [facility]08[facility]   Date  07/08/15   Educator  Tamsen SniderStacey Joyce, RRT   Instruction Review Code  2- meets goals/outcomes      Heart Failure: - Group verbal and written instruction on the basics of heart failure: signs/symptoms, treatments, explanation of ejection fraction, enlarged heart and cardiomyopathy.   Sleep Apnea: - Individual verbal and written instruction to review Obstructive Sleep Apnea. Review of risk factors, methods for diagnosing and types of masks and machines for OSA.   Anxiety: - Provides group, verbal and written instruction on the correlation between heart/lung disease and anxiety, treatment options, and management of anxiety.      Pulmonary Rehab [facility] 07[facility]08/2015 [facility]   Date  06/15/15   Educator  Heloise Ochoaathy Clayton, MSW   Instruction Review Code  2- Meets goals/outcomes      Relaxation: - Provides group, verbal and written instruction about the benefits of relaxation for patients with heart/lung disease. Also provides patients with examples of relaxation techniques.   Knowledge Questionnaire Score:     Knowledge Questionnaire Score - 04/19/15 1130    Knowledge Questionnaire Score   Pre Score -3      Personal Goals and Risk Factors at Admission:     Personal Goals and Risk Factors at Admission - 04/19/15 1130    Personal Goals and Risk Factors on Admission    Weight Management Yes   Intervention Learn and follow the exercise and diet guidelines while in the program. Utilize the nutrition and education classes to help gain knowledge of the diet and exercise expectations in the program  Ms Kapler would like to meet with the dietitian.  Her goal is to lose  15lbs. She lives with her daughter and they do eat alot of fruits and vegetables; no salt.   Admit Weight 165 lb (74.844 kg)   Goal Weight 150 lb (68.04 kg)   Increase Aerobic Exercise and Physical Activity Yes   Intervention While in program, learn and follow the exercise prescription taught. Start at a low level workload and increase workload after able to maintain previous level for 30 minutes. Increase time before increasing intensity.  Ms Norrod does have a pedal machine. She would like to strength herself where she is not so dependent on her cane.   Understand more about Heart/Pulmonary Disease. Yes   Intervention While in program utilize professionals for any questions, and attend the education sessions. Great websites to use are www.americanheart.org or www.lung.org for reliable information.  Ms Payton has COPD and CHF and is interested in learning more to manage her diseases.   Improve shortness of breath with ADL's Yes   Intervention While in program, learn and follow the exercise prescription taught. Start at a low level workload and increase workload ad advised by the exercise physiologist. Increase time before increasing intensity.  Ms Cardarelli scored a 2892 on the [employer] SOB Questionnaire. She would like to do more activity without shortness of breath.   Develop more efficient breathing techniques such as purse lipped breathing and diaphragmatic breathing; and practicing self-pacing with activity Yes   Intervention While in program, learn and utilize the specific breathing techniques taught to you. Continue to practice and use the techniques as needed.   Increase knowledge of respiratory medications  and ability to use respiratory devices properly.  Yes   Intervention While in program learn and demonstrate appropriate use of your oxygen therapy by increasing flow with exertion, manage oxygen tank operation, including continuous and intermittent flow.  Understanding oxygen is a drug ordered by  your physician.;While in program, learn to administer MDI, nebulizer, and spacer properly.;Learn to take respiratory medicine as ordered.;While in program, learn to Clean MDI, nebulizers, and spacers properly.  Ms Zaldivar has a good understanding of her Annie Sable, ProAir, Spacer, and Albuterol  nebulizer.  She uses a gas portable O2 tank, 2l/m [place], and her service is from [facility].   Diabetes Yes   Hypertension Yes   Lipids Yes      Personal Goals and Risk Factors Review:      Goals and Risk Factor Review      05/30/15 1130 06/03/15 1610 06/06/15 1130 06/24/15 1259 07/01/15 1130   Weight Management   Goals Progress/Improvement seen   Yes Yes    Comments   Ms Dingee has lose 5lbs since her start date in [facility]. She would like to meet with the dietitian. Jadie wore a shirt today that she couldn't button before she was in this program and has seen her "belly go down". She has noticed changes in her body composition and is very impressed with the results from the exercise. She is down 6 lbs since starting in the program and weighs 163 with the goal of getting down to 150-155 lbs.     Increase Aerobic Exercise and Physical Activity   Goals Progress/Improvement seen  Yes   Yes    Comments Ms Cristiano worked hard today to meet her exercise goals on the NS and BioStep - total exercise time ; plus she stayed for weight training; she states the importance for her and her health to be active.    The exercise equipment is getting easier for Yazhini and she has been able to make increases since starting. She really enjoys the equipment and has noticed she can get up easier and can do more at the house since starting [facility].     Understand more about Heart/Pulmonary Disease   Goals Progress/Improvement seen    Yes Yes    Comments   Ms Mancuso enjoys the education and states she has learned more new information for disease management. The education has been very helpful and Kenika has  learned a lot about pulmonary disease and how to manage it. She especially has learned a lot from one on one conversations with Laureen.     Improve shortness of breath with ADL's   Goals Progress/Improvement seen    Yes Yes    Comments   Ms Feng states she has noticed a difference in her shortness of breath with activites at home, especially her morning routine. Albertina is able to move around her house better and isn't as tired when she goes out for appointments. She has noticed that she does not need her oxygen as much since starting this program and is less short of breath.    Breathing Techniques   Goals Progress/Improvement seen  Yes   Yes    Comments Ms Argo uses PLB with her exercise goals and finds it very helpful.   Pansie uses the breathing techniques she has learned in class when she is at home; she tries to do diaphramatic breathing 3 times a day for 10 repetitions. She also uses pursed lipped breathing when on the machines in  class.     Increase knowledge of respiratory medications   Goals Progress/Improvement seen   Yes   Yes   Comments  Ms. Pain was given a spacer and shown how to use it with her MDI's.  She seemed very impressed with the whistle feature on the spacer and said that it would help her to take her medication correctly..   Ms Robello has her MDI's arranged by her chair and knows when to take her inhalers. Her daughter also helps with her schedule. She is using her oxygen at home and sith some activity.     07/08/15 1306           Increase Aerobic Exercise and Physical Activity   Goals Progress/Improvement seen  Yes       Comments Is doing Nusteph T4 but a little tired today but she feels she is better than when she first started in [facility]          Personal Goals Discharge (Final Personal Goals and Risk Factors Review):      Goals and Risk Factor Review - 07/08/15 1306    Increase Aerobic Exercise and Physical Activity   Goals Progress/Improvement seen  Yes    Comments Is doing Nusteph T4 but a little tired today but she feels she is better than when she first started in [facility]      Comments: Ms Garritano last attended 07/08/2015. She had a hospital admission for pneumonia and recovered at a rehab facility. Currently she is having home therapy and requested to be discharged from [facility].

## 2015-08-31 ENCOUNTER — Ambulatory Visit: Payer: Medicare Other | Admitting: Cardiovascular Disease

## 2015-09-01 ENCOUNTER — Ambulatory Visit: Payer: Medicare Other | Admitting: Cardiovascular Disease

## 2015-09-02 ENCOUNTER — Inpatient Hospital Stay (HOSPITAL_BASED_OUTPATIENT_CLINIC_OR_DEPARTMENT_OTHER): Payer: Medicare Other | Admitting: Hematology and Oncology

## 2015-09-02 ENCOUNTER — Inpatient Hospital Stay: Payer: Medicare Other | Attending: Hematology and Oncology

## 2015-09-02 ENCOUNTER — Other Ambulatory Visit: Payer: Self-pay | Admitting: *Deleted

## 2015-09-02 ENCOUNTER — Inpatient Hospital Stay: Payer: Medicare Other

## 2015-09-02 VITALS — BP 136/68 | HR 78 | Temp 97.0°F | Resp 18

## 2015-09-02 VITALS — BP 135/67 | HR 76 | Temp 97.1°F | Wt 144.4 lb

## 2015-09-02 DIAGNOSIS — D649 Anemia, unspecified: Secondary | ICD-10-CM

## 2015-09-02 DIAGNOSIS — D509 Iron deficiency anemia, unspecified: Secondary | ICD-10-CM | POA: Diagnosis present

## 2015-09-02 DIAGNOSIS — K219 Gastro-esophageal reflux disease without esophagitis: Secondary | ICD-10-CM | POA: Diagnosis not present

## 2015-09-02 DIAGNOSIS — M818 Other osteoporosis without current pathological fracture: Secondary | ICD-10-CM | POA: Diagnosis not present

## 2015-09-02 DIAGNOSIS — Z87891 Personal history of nicotine dependence: Secondary | ICD-10-CM | POA: Diagnosis not present

## 2015-09-02 DIAGNOSIS — I341 Nonrheumatic mitral (valve) prolapse: Secondary | ICD-10-CM

## 2015-09-02 DIAGNOSIS — Z8673 Personal history of transient ischemic attack (TIA), and cerebral infarction without residual deficits: Secondary | ICD-10-CM

## 2015-09-02 DIAGNOSIS — Z8701 Personal history of pneumonia (recurrent): Secondary | ICD-10-CM

## 2015-09-02 DIAGNOSIS — R195 Other fecal abnormalities: Secondary | ICD-10-CM | POA: Insufficient documentation

## 2015-09-02 DIAGNOSIS — K222 Esophageal obstruction: Secondary | ICD-10-CM

## 2015-09-02 DIAGNOSIS — I10 Essential (primary) hypertension: Secondary | ICD-10-CM | POA: Insufficient documentation

## 2015-09-02 DIAGNOSIS — K449 Diaphragmatic hernia without obstruction or gangrene: Secondary | ICD-10-CM | POA: Insufficient documentation

## 2015-09-02 DIAGNOSIS — I5032 Chronic diastolic (congestive) heart failure: Secondary | ICD-10-CM | POA: Diagnosis not present

## 2015-09-02 DIAGNOSIS — Z7982 Long term (current) use of aspirin: Secondary | ICD-10-CM | POA: Insufficient documentation

## 2015-09-02 DIAGNOSIS — G500 Trigeminal neuralgia: Secondary | ICD-10-CM

## 2015-09-02 DIAGNOSIS — E785 Hyperlipidemia, unspecified: Secondary | ICD-10-CM

## 2015-09-02 DIAGNOSIS — E119 Type 2 diabetes mellitus without complications: Secondary | ICD-10-CM | POA: Diagnosis not present

## 2015-09-02 DIAGNOSIS — Z8781 Personal history of (healed) traumatic fracture: Secondary | ICD-10-CM

## 2015-09-02 DIAGNOSIS — Z79899 Other long term (current) drug therapy: Secondary | ICD-10-CM | POA: Diagnosis not present

## 2015-09-02 DIAGNOSIS — I480 Paroxysmal atrial fibrillation: Secondary | ICD-10-CM | POA: Diagnosis not present

## 2015-09-02 DIAGNOSIS — J449 Chronic obstructive pulmonary disease, unspecified: Secondary | ICD-10-CM

## 2015-09-02 DIAGNOSIS — Z9114 Patient's other noncompliance with medication regimen: Secondary | ICD-10-CM | POA: Diagnosis not present

## 2015-09-02 DIAGNOSIS — D508 Other iron deficiency anemias: Secondary | ICD-10-CM

## 2015-09-02 LAB — CBC WITH DIFFERENTIAL/PLATELET
BASOS ABS: 0 10*3/uL (ref 0–0.1)
Basophils Relative: 0 %
EOS ABS: 0.1 10*3/uL (ref 0–0.7)
EOS PCT: 1 %
HCT: 29.1 % — ABNORMAL LOW (ref 35.0–47.0)
Hemoglobin: 9.1 g/dL — ABNORMAL LOW (ref 12.0–16.0)
LYMPHS ABS: 1.1 10*3/uL (ref 1.0–3.6)
LYMPHS PCT: 15 %
MCH: 26.2 pg (ref 26.0–34.0)
MCHC: 31.4 g/dL — ABNORMAL LOW (ref 32.0–36.0)
MCV: 83.5 fL (ref 80.0–100.0)
MONO ABS: 0.6 10*3/uL (ref 0.2–0.9)
Monocytes Relative: 9 %
Neutro Abs: 5.5 10*3/uL (ref 1.4–6.5)
Neutrophils Relative %: 75 %
PLATELETS: 375 10*3/uL (ref 150–440)
RBC: 3.49 MIL/uL — ABNORMAL LOW (ref 3.80–5.20)
RDW: 19.5 % — AB (ref 11.5–14.5)
WBC: 7.4 10*3/uL (ref 3.6–11.0)

## 2015-09-02 LAB — IRON AND TIBC
Iron: 38 ug/dL (ref 28–170)
Saturation Ratios: 11 % (ref 10.4–31.8)
TIBC: 361 ug/dL (ref 250–450)
UIBC: 324 ug/dL

## 2015-09-02 LAB — FERRITIN: FERRITIN: 22 ng/mL (ref 11–307)

## 2015-09-02 MED ORDER — SODIUM CHLORIDE 0.9 % IV SOLN
510.0000 mg | Freq: Once | INTRAVENOUS | Status: AC
  Administered 2015-09-02: 510 mg via INTRAVENOUS
  Filled 2015-09-02: qty 17

## 2015-09-02 MED ORDER — SODIUM CHLORIDE 0.9 % IV SOLN
Freq: Once | INTRAVENOUS | Status: AC
  Administered 2015-09-02: 15:00:00 via INTRAVENOUS
  Filled 2015-09-02: qty 1000

## 2015-09-05 ENCOUNTER — Ambulatory Visit (INDEPENDENT_AMBULATORY_CARE_PROVIDER_SITE_OTHER): Payer: Medicare Other | Admitting: Cardiovascular Disease

## 2015-09-05 ENCOUNTER — Encounter: Payer: Self-pay | Admitting: Cardiovascular Disease

## 2015-09-05 VITALS — BP 130/82 | HR 71 | Ht 60.0 in | Wt 160.4 lb

## 2015-09-05 DIAGNOSIS — I10 Essential (primary) hypertension: Secondary | ICD-10-CM

## 2015-09-05 DIAGNOSIS — D649 Anemia, unspecified: Secondary | ICD-10-CM

## 2015-09-05 DIAGNOSIS — I5032 Chronic diastolic (congestive) heart failure: Secondary | ICD-10-CM | POA: Diagnosis not present

## 2015-09-05 DIAGNOSIS — E871 Hypo-osmolality and hyponatremia: Secondary | ICD-10-CM

## 2015-09-05 DIAGNOSIS — I5033 Acute on chronic diastolic (congestive) heart failure: Secondary | ICD-10-CM | POA: Diagnosis not present

## 2015-09-05 DIAGNOSIS — I059 Rheumatic mitral valve disease, unspecified: Secondary | ICD-10-CM | POA: Diagnosis not present

## 2015-09-05 DIAGNOSIS — J449 Chronic obstructive pulmonary disease, unspecified: Secondary | ICD-10-CM

## 2015-09-05 NOTE — Assessment & Plan Note (Signed)
Currently on nasal cannula oxygen  This may dramatically improve after additional diuresis

## 2015-09-05 NOTE — Assessment & Plan Note (Signed)
We have ordered repeat BMP today

## 2015-09-05 NOTE — Progress Notes (Signed)
Patient ID: Catherine AmassHelen K Frediani, female    DOB: August 25, 1928, 81 y.o.   MRN: [id number]  HPI Comments: 81 year-old woman with a history of DM,  peripheral vascular disease, 60 to 70% carotid arterial disease, moderate arterial disease of the lower extremities, status post bilateral iliac stenting, moderate renal stenosis, hyperlipidemia, history of severe  hypertension with labile blood pressures, history of episodic shortness of breath and chest squeezing relieved with albuterol/nebs who presents for routine followup. She has a sulfa allergy to loop diuretics. History of falls.   left carotid arterial stenosis estimated at 60-79% disease. She has had several admissions for diastolic CHF, typically from dietary indiscretion and periodic noncompliance with diuretics She was admitted on 11/02/2013 , discharged on 11/16/2013  admitted on 01/24/2014, discharge from 01/27/2014 for acute respiratory failure secondary to COPD History of anemia followed by hematology She presents today for follow-up of her chronic diastolic CHF  On her last clinic visit, she had severe leg edema extending up her thighs, shortness of breath, abdominal swelling We recommended Bumex 2 mg twice a day With this her breathing has improved in the past 2 weeks, abdominal swelling resolved, now with mild to moderate thigh edema Still with significant swelling beneath the knees bilaterally Overall she is in better spirits. She has cut down on her fluid intake significantly  She has been taking potassium twice a day. No recent lab work available Recent iron infusion, repeat iron infusion scheduled October 03 2015  EKG on today's visit shows normal sinus rhythm with rate 71 bpm, no significant ST or T-wave changes   Other past medical history hospitalized  September 2016 at an outside facility for pneumonia and sepsis.  she underwent the chest compressions CPR for what that looks like a vasovagal episode and had a sternal fracture. We  presented to the hospital early October 2016 with hypoxia, shortness of breath , inadequate pain control /Chest pain. sodium level of 129, mild anemia with hemoglobin level of 10.5.    CT scan of the chest showed no pulmonary embolism. However, is based consolidation in both lung bases, more on the right than on the left with small right pleural effusion was noted, as well as stable collapse of T8 and T12 vertebral bodies  she was treated for pneumonia , [facility]   During her time in [facility], she had dramatic weight gain, lower extremity edema  We received a call us morning that she was going to acute care.  We have recommended that she come to the office for urgent evaluation  On evaluation today, she has 2+ pitting edema to her thighs.  She  Reports mildshortness of breath,  abdominal bloating  Significant sores and ecchymoses of her legs.  Compression wraps in place  She has been taking Bumex sparingly  she feels that she needs a iron infusion.  Hemoglobin down to 8.8.   Recent hospital admission for acute on chronic diastolic CHF. Improvement of her symptoms with aggressive diuresis. Readmitted this past week to the hospital with hypotension. She has persistent anemia  had iron infusions in the past.   Followed by Dr. Cherylann RatelLateef for her kidney issues  She stopped her amiodarone out of concern for possible pulmonary complications. Denies having any palpitations She does report having rare episodes of near syncope wall in a sitting position. Comes across her like a wave it resolves relatively quickly  She was in the hospital 05/05/2014 with hypertensive urgency, hyponatremia. Blood pressure is 201/98  and she was put back on her outpatient regiment with improvement of her symptoms. She suffered a fractured hip on 05/07/2014 and we were consulted for preop evaluation. Found to have elevated at her pressures at 65. She was discharged on 05/17/2014 EKGs from her  hospital course showed she had atrial fibrillation on 05/09/2014  Previous Echocardiogram in the hospital showed normal ejection fraction, moderate to severe, hypertension, moderately dilated left atrium  Has a history of large hiatal hernia She is not able to tolerate statins. She does handle zetia without significant side effects. previously encouraged to take red yeast rice.   Previous weight October 2014 was 148 pounds.  weight at the end of December 2014 was 160 pounds, she had increasing shortness of breath, had not been taking Bumex    in the hospital at [facility] in 2012 for general malaise, severe hypertension, shortness of breath, chest pain. She had a stress test, lexiscan that showed no significant ischemia. Significant shortness of breath with lexiscan.   hospital admission on 01/08/2012 for shortness of breath and syncope. She was found to have significant anemia. Treated with Lasix for diastolic CHF.  Underlying severe left carotid arterial disease estimated at 70%.   Prior followup with Dr. Welton Flakes in pulmonary with numerous CT scans, MRIs, PFTs, sleep study. She was told that she had hypoxemia overnight and was started on nighttime oxygen.    hospitalization 05/27/2013 for acute on chronic diastolic CHF. She was treated with IV Bumex twice a day with improvement of her symptoms. Getting factor was her anemia with hematocrit of 25.    Outpatient Encounter Prescriptions as of 09/05/2015  Medication Sig  . albuterol (PROAIR HFA) 108 (90 BASE) MCG/ACT inhaler Inhale 2 puffs into the lungs every 6 (six) hours as needed.  Marland Kitchen albuterol (PROVENTIL) (2.5 MG/3ML) 0.083% nebulizer solution Take 2.5 mg by nebulization every 6 (six) hours as needed for wheezing or shortness of breath.   Marland Kitchen arformoterol (BROVANA) 15 MCG/2ML NEBU Take 2 mLs (15 mcg total) by nebulization 2 (two) times daily.  Marland Kitchen aspirin EC 81 MG tablet Take 81 mg by mouth daily.  . beta carotene w/minerals (OCUVITE) tablet Take 1  tablet by mouth 2 (two) times daily.  . bumetanide (BUMEX) 2 MG tablet Take 1 tablet (2 mg total) by mouth 2 (two) times daily as needed. Take 1 tab daily  . calcium carbonate (OS-CAL) 600 MG TABS tablet Take 600 mg by mouth daily with breakfast.  . Cholecalciferol (VITAMIN D-3 PO) Take 1,000 Units by mouth.  . citalopram (CELEXA) 20 MG tablet Take 20 mg by mouth daily.  . diphenhydramine-acetaminophen (TYLENOL PM) 25-500 MG TABS Take 1 tablet by mouth at bedtime as needed.  . doxazosin (CARDURA) 4 MG tablet Take 1 tablet (4 mg total) by mouth daily.  Marland Kitchen ezetimibe (ZETIA) 10 MG tablet Take 5 mg by mouth daily.  . fluticasone (FLONASE) 50 MCG/ACT nasal spray Place 1 spray into both nostrils daily.  Marland Kitchen gabapentin (NEURONTIN) 100 MG capsule Take 100 mg by mouth 2 (two) times daily.   . hydrALAZINE (APRESOLINE) 50 MG tablet Take 50 mg by mouth as needed.   Marland Kitchen HYDROcodone-acetaminophen (NORCO/VICODIN) 5-325 MG tablet Take 1-2 tablets by mouth 2 (two) times daily as needed for moderate pain.  . mometasone-formoterol (DULERA) 100-5 MCG/ACT AERO Inhale 2 puffs into the lungs 2 (two) times daily.   . Multiple Vitamin (MULTIVITAMIN) tablet Take 1 tablet by mouth daily.    . mupirocin ointment (BACTROBAN) 2 %  Apply topically.  . nebivolol (BYSTOLIC) 5 MG tablet Take 1 tablet (5 mg total) by mouth daily.  . nitroGLYCERIN (NITROSTAT) 0.4 MG SL tablet Place 1 tablet (0.4 mg total) under the tongue every 5 (five) minutes as needed for chest pain.  . potassium chloride (K-DUR,KLOR-CON) 10 MEQ tablet Take 0.5 tablets (5 mEq total) by mouth 2 (two) times daily as needed. Patient takes this when she takes Bumex.  Marland Kitchen spironolactone (ALDACTONE) 50 MG tablet Take 50 mg by mouth daily as needed.  . tiotropium (SPIRIVA) 18 MCG inhalation capsule Place 1 capsule (18 mcg total) into inhaler and inhale daily.  . [DISCONTINUED] lidocaine (LIDODERM) 5 % Place 1 patch onto the skin every 12 (twelve) hours. Remove & Discard patch  within 12 hours or as directed by MD  . [DISCONTINUED] methylPREDNISolone (MEDROL DOSEPAK) 4 MG TBPK tablet follow package directions (Patient not taking: Reported on 09/02/2015)   Facility-Administered Encounter Medications as of 09/05/2015  Medication  . 0.9 %  sodium chloride infusion  . alteplase (CATHFLO ACTIVASE) injection 2 mg  . ferumoxytol (FERAHEME) 510 mg in sodium chloride 0.9 % 100 mL IVPB  . heparin lock flush 100 unit/mL  . heparin lock flush 100 unit/mL  . sodium chloride 0.9 % injection 10 mL  . sodium chloride 0.9 % injection 3 mL   Social hx Social History   Social History  . Marital Status: Divorced    Spouse Name: N/A  . Number of Children: 2  . Years of Education: N/A   Occupational History  . Retired     former IT sales professional   Social History Main Topics  . Smoking status: Former Smoker -- 0.50 packs/day for 30 years    Types: Cigarettes    Quit date: 10/22/1994  . Smokeless tobacco: Never Used  . Alcohol Use: Yes  . Drug Use: No  . Sexual Activity: Not on file   Other Topics Concern  . Not on file   Social History Narrative    Review of Systems  Cardiovascular: Positive for leg swelling.  Gastrointestinal: Negative.   Musculoskeletal: Positive for arthralgias and gait problem.  Neurological: Positive for weakness.  Hematological: Negative.   Psychiatric/Behavioral: Negative.   All other systems reviewed and are negative.   BP 130/82 mmHg  Pulse 71  Ht 5' (1.524 m)  Wt 160 lb 6 oz (72.746 kg)  BMI 31.32 kg/m2  Physical Exam  Constitutional: She is oriented to person, place, and time. She appears well-developed and well-nourished.   Presenting in wheelchair  HENT:  Head: Normocephalic.  Nose: Nose normal.  Mouth/Throat: Oropharynx is clear and moist.  Eyes: Conjunctivae are normal. Pupils are equal, round, and reactive to light.  Neck: Normal range of motion. Neck supple. No JVD present.  Cardiovascular: Normal rate, regular  rhythm, S1 normal, S2 normal, normal heart sounds and intact distal pulses.  Exam reveals no gallop and no friction rub.   No murmur heard.  1+ pitting edema to the thighs , regions of ecchymosis and sores on her legs , compression hose in place  Pulmonary/Chest: Effort normal and breath sounds normal. No respiratory distress. She has no wheezes. She has no rales. She exhibits no tenderness.  Abdominal: Soft. Bowel sounds are normal. She exhibits no distension. There is no tenderness.  Musculoskeletal: Normal range of motion. She exhibits no edema or tenderness.  Lymphadenopathy:    She has no cervical adenopathy.  Neurological: She is alert and oriented to person, place, and time.  Coordination normal.  Skin: Skin is warm and dry. No rash noted. No erythema.  Psychiatric: She has a normal mood and affect. Her behavior is normal. Judgment and thought content normal.    Assessment and Plan  Nursing note and vitals reviewed.

## 2015-09-05 NOTE — Assessment & Plan Note (Signed)
Improved hemodynamics on today's visit , encouraged her to stay on Bumex 2 mg twice a day  Dramatically improve shortness of breath, abdominal swelling, still with significant thigh and  Below knee extremity edema  we'll check basic metabolic panel today

## 2015-09-05 NOTE — Patient Instructions (Addendum)
You are doing well. Fluid is coming   Please stay on the bumex 2 mg twice a day Go to bumex 2 mg once a day when you only have ankle swelling  Goal weight 153 pounds at home  Hold the cardura for low blood pressure (or take 1/2 pill)  We will check labs today: BMP  Please call us if you have new issues that need to be addressed before your next appt.  Your physician wants you to follow-up in: 3 weeks

## 2015-09-05 NOTE — Assessment & Plan Note (Signed)
Blood pressure has been running low recently. Recommended she decrease the Cardura in half for low blood pressure or hold the medication  She's not been taking hydralazine recently for low blood pressure

## 2015-09-05 NOTE — Assessment & Plan Note (Signed)
Received iron infusion, repeat next month  This should dramatically improve her edema , exacerbated by anemia and heart failure

## 2015-09-06 ENCOUNTER — Telehealth: Payer: Self-pay | Admitting: *Deleted

## 2015-09-06 LAB — BASIC METABOLIC PANEL
BUN/Creatinine Ratio: 17 (ref 11–26)
BUN: 11 mg/dL (ref 8–27)
CALCIUM: 9.2 mg/dL (ref 8.7–10.3)
CHLORIDE: 87 mmol/L — AB (ref 97–106)
CO2: 34 mmol/L — AB (ref 18–29)
Creatinine, Ser: 0.63 mg/dL (ref 0.57–1.00)
GFR calc Af Amer: 95 mL/min/{1.73_m2} (ref >59)
GFR, EST NON AFRICAN AMERICAN: 82 mL/min/{1.73_m2} (ref >59)
GLUCOSE: 105 mg/dL — AB (ref 65–99)
POTASSIUM: 4.8 mmol/L (ref 3.5–5.2)
SODIUM: 134 mmol/L — AB (ref 136–144)

## 2015-09-06 NOTE — Telephone Encounter (Signed)
Pt daughter calling stating that they were in yesterday and didn't think to mention it.  But lately pt is getting nauseated and now they think it is because of the Bumex  Pt took 25 mg of Spironolactone and then took 2 Bumex, states within the hour of taking the Bumex she got nauseated and burn and ich.  Wanted to know what else can pt take other than the Bumex  Please advise.

## 2015-09-06 NOTE — Telephone Encounter (Signed)
Patient daughter called back.  States she has been taking Bumex on empty stomach .  She is going to try to take with food and see if this helps.  Will call back with results and further assistance if needed.

## 2015-09-22 ENCOUNTER — Other Ambulatory Visit: Payer: Medicare Other

## 2015-09-22 ENCOUNTER — Ambulatory Visit: Payer: Medicare Other | Admitting: Hematology and Oncology

## 2015-09-29 ENCOUNTER — Ambulatory Visit (INDEPENDENT_AMBULATORY_CARE_PROVIDER_SITE_OTHER): Payer: Medicare Other | Admitting: Cardiovascular Disease

## 2015-09-29 ENCOUNTER — Encounter: Payer: Self-pay | Admitting: Cardiovascular Disease

## 2015-09-29 VITALS — BP 121/78 | HR 78 | Ht 60.0 in | Wt 165.5 lb

## 2015-09-29 DIAGNOSIS — I5033 Acute on chronic diastolic (congestive) heart failure: Secondary | ICD-10-CM

## 2015-09-29 DIAGNOSIS — J449 Chronic obstructive pulmonary disease, unspecified: Secondary | ICD-10-CM

## 2015-09-29 DIAGNOSIS — E1159 Type 2 diabetes mellitus with other circulatory complications: Secondary | ICD-10-CM | POA: Diagnosis not present

## 2015-09-29 DIAGNOSIS — I10 Essential (primary) hypertension: Secondary | ICD-10-CM

## 2015-09-29 MED ORDER — CHLORTHALIDONE 25 MG PO TABS: 25.0000 mg | ORAL_TABLET | Freq: Every day | ORAL | Status: DC | PRN

## 2015-09-29 NOTE — Assessment & Plan Note (Signed)
Blood pressure is well controlled on today's visit. No changes made to the medications. 

## 2015-09-29 NOTE — Progress Notes (Signed)
Patient ID: Catherine Holmes, female    DOB: October 20, 1928, 81 y.o.   MRN: [id number]  HPI Comments: 81 year-old woman with a history of DM,  peripheral vascular disease, 60 to 70% carotid arterial disease, moderate arterial disease of the lower extremities, status post bilateral iliac stenting, moderate renal stenosis, hyperlipidemia, history of severe  hypertension with labile blood pressures, history of episodic shortness of breath and chest squeezing relieved with albuterol/nebs who presents for routine followup of her chronic diastolic CHF  . She has a sulfa allergy to loop diuretics. History of falls.   left carotid arterial stenosis estimated at 60-79% disease. She has had several admissions for diastolic CHF, typically from dietary indiscretion and periodic noncompliance with diuretics She was admitted on 11/02/2013 , discharged on 11/16/2013  admitted on 01/24/2014, discharge from 01/27/2014 for acute respiratory failure secondary to COPD History of anemia followed by hematology  In follow-up today, weight is up several pounds, worsening leg edema She has missed several doses of her Bumex. Also reports when she does take Bumex, it does not seem to work as well. Denies any shortness of breath. She is concerned about small spots on her legs, blistering Reports her legs "feel like wood" from the  Swelling Reports she is not drinking very much fluids  On her last clinic visit, weight was in the low 160 range, now 165 At home weight reached 159 pounds, now 163 at home Scheduled to see hematology in several days' time, may need iron infusion  Other past medical history Over the summer 2016, she had severe leg edema extending up her thighs, shortness of breath, abdominal swelling We recommended Bumex 2 mg twice a day Persistent edemasince that time  hospitalized  September 2016 at an outside facility for pneumonia and sepsis.  she underwent the chest compressions CPR for what that looks like  a vasovagal episode and had a sternal fracture. We presented to the hospital early October 2016 with hypoxia, shortness of breath , inadequate pain control /Chest pain. sodium level of 129, mild anemia with hemoglobin level of 10.5.    CT scan of the chest showed no pulmonary embolism. However, is based consolidation in both lung bases, more on the right than on the left with small right pleural effusion was noted, as well as stable collapse of T8 and T12 vertebral bodies  she was treated for pneumonia , discharged to [facility]   During her time in [facility], she had dramatic weight gain, lower extremity edema  We received a call us morning that she was going to acute care.  We have recommended that she come to the office for urgent evaluation  On evaluation today, she has 2+ pitting edema to her thighs.  She  Reports mildshortness of breath,  abdominal bloating  Significant sores and ecchymoses of her legs.  Compression wraps in place  She has been taking Bumex sparingly  she feels that she needs a iron infusion.  Hemoglobin down to 8.8.   Recent hospital admission for acute on chronic diastolic CHF. Improvement of her symptoms with aggressive diuresis. Readmitted this past week to the hospital with hypotension. She has persistent anemia  had iron infusions in the past.   Followed by Dr. Cherylann RatelLateef for her kidney issues  She stopped her amiodarone out of concern for possible pulmonary complications. Denies having any palpitations She does report having rare episodes of near syncope wall in a sitting position. Comes across her like a wave it resolves  relatively quickly  She was in the hospital 05/05/2014 with hypertensive urgency, hyponatremia. Blood pressure is 201/98 and she was put back on her outpatient regiment with improvement of her symptoms. She suffered a fractured hip on 05/07/2014 and we were consulted for preop evaluation. Found to have elevated at her pressures at 65. She  was discharged on 05/17/2014 EKGs from her hospital course showed she had atrial fibrillation on 05/09/2014  Previous Echocardiogram in the hospital showed normal ejection fraction, moderate to severe, hypertension, moderately dilated left atrium  Has a history of large hiatal hernia She is not able to tolerate statins. She does handle zetia without significant side effects. previously encouraged to take red yeast rice.   Previous weight October 2014 was 148 pounds.  weight at the end of December 2014 was 160 pounds, she had increasing shortness of breath, had not been taking Bumex    in the hospital at [facility] in 2012 for general malaise, severe hypertension, shortness of breath, chest pain. She had a stress test, lexiscan that showed no significant ischemia. Significant shortness of breath with lexiscan.   hospital admission on 01/08/2012 for shortness of breath and syncope. She was found to have significant anemia. Treated with Lasix for diastolic CHF.  Underlying severe left carotid arterial disease estimated at 70%.   Prior followup with Dr. Welton Flakes in pulmonary with numerous CT scans, MRIs, PFTs, sleep study. She was told that she had hypoxemia overnight and was started on nighttime oxygen.    hospitalization 05/27/2013 for acute on chronic diastolic CHF. She was treated with IV Bumex twice a day with improvement of her symptoms. Getting factor was her anemia with hematocrit of 25.    Outpatient Encounter Prescriptions as of 09/29/2015  Medication Sig  . albuterol (PROAIR HFA) 108 (90 BASE) MCG/ACT inhaler Inhale 2 puffs into the lungs every 6 (six) hours as needed.  Marland Kitchen albuterol (PROVENTIL) (2.5 MG/3ML) 0.083% nebulizer solution Take 2.5 mg by nebulization every 6 (six) hours as needed for wheezing or shortness of breath.   Marland Kitchen arformoterol (BROVANA) 15 MCG/2ML NEBU Take 2 mLs (15 mcg total) by nebulization 2 (two) times daily.  Marland Kitchen aspirin EC 81 MG tablet Take 81 mg by mouth daily.  . beta  carotene w/minerals (OCUVITE) tablet Take 1 tablet by mouth 2 (two) times daily.  . bumetanide (BUMEX) 2 MG tablet Take 1 tablet (2 mg total) by mouth 2 (two) times daily as needed. Take 1 tab daily  . calcium carbonate (OS-CAL) 600 MG TABS tablet Take 600 mg by mouth daily with breakfast.  . Cholecalciferol (VITAMIN D-3 PO) Take 1,000 Units by mouth.  . citalopram (CELEXA) 20 MG tablet Take 20 mg by mouth daily.  . diphenhydramine-acetaminophen (TYLENOL PM) 25-500 MG TABS Take 1 tablet by mouth at bedtime as needed.  . doxazosin (CARDURA) 4 MG tablet Take 1 tablet (4 mg total) by mouth daily.  Marland Kitchen ezetimibe (ZETIA) 10 MG tablet Take 5 mg by mouth daily.  . fluticasone (FLONASE) 50 MCG/ACT nasal spray Place 1 spray into both nostrils daily.  Marland Kitchen gabapentin (NEURONTIN) 100 MG capsule Take 100 mg by mouth 2 (two) times daily.   . hydrALAZINE (APRESOLINE) 50 MG tablet Take 50 mg by mouth as needed.   Marland Kitchen HYDROcodone-acetaminophen (NORCO/VICODIN) 5-325 MG tablet Take 1-2 tablets by mouth 2 (two) times daily as needed for moderate pain.  . mometasone-formoterol (DULERA) 100-5 MCG/ACT AERO Inhale 2 puffs into the lungs 2 (two) times daily.   . Multiple Vitamin (  MULTIVITAMIN) tablet Take 1 tablet by mouth daily.    . mupirocin ointment (BACTROBAN) 2 % Apply topically.  . nebivolol (BYSTOLIC) 5 MG tablet Take 1 tablet (5 mg total) by mouth daily.  . nitroGLYCERIN (NITROSTAT) 0.4 MG SL tablet Place 1 tablet (0.4 mg total) under the tongue every 5 (five) minutes as needed for chest pain.  . potassium chloride (K-DUR,KLOR-CON) 10 MEQ tablet Take 0.5 tablets (5 mEq total) by mouth 2 (two) times daily as needed. Patient takes this when she takes Bumex.  Marland Kitchen spironolactone (ALDACTONE) 50 MG tablet daily as needed. Takes 1 tablet Mon. Wednesday and Friday.  . tiotropium (SPIRIVA) 18 MCG inhalation capsule Place 1 capsule (18 mcg total) into inhaler and inhale daily.  . chlorthalidone (HYGROTON) 25 MG tablet Take 1  tablet (25 mg total) by mouth daily as needed.   Facility-Administered Encounter Medications as of 09/29/2015  Medication  . 0.9 %  sodium chloride infusion  . alteplase (CATHFLO ACTIVASE) injection 2 mg  . ferumoxytol (FERAHEME) 510 mg in sodium chloride 0.9 % 100 mL IVPB  . heparin lock flush 100 unit/mL  . heparin lock flush 100 unit/mL  . sodium chloride 0.9 % injection 10 mL  . sodium chloride 0.9 % injection 3 mL   Social hx Social History   Social History  . Marital Status: Divorced    Spouse Name: N/A  . Number of Children: 2  . Years of Education: N/A   Occupational History  . Retired     former IT sales professional   Social History Main Topics  . Smoking status: Former Smoker -- 0.50 packs/day for 30 years    Types: Cigarettes    Quit date: 10/22/1994  . Smokeless tobacco: Never Used  . Alcohol Use: Yes  . Drug Use: No  . Sexual Activity: Not on file   Other Topics Concern  . Not on file   Social History Narrative    Review of Systems  Cardiovascular: Positive for leg swelling.  Gastrointestinal: Negative.   Musculoskeletal: Positive for arthralgias and gait problem.  Neurological: Positive for weakness.  Hematological: Negative.   Psychiatric/Behavioral: Negative.   All other systems reviewed and are negative.   BP 121/78 mmHg  Pulse 78  Ht 5' (1.524 m)  Wt 165 lb 8 oz (75.07 kg)  BMI 32.32 kg/m2  Physical Exam  Constitutional: She is oriented to person, place, and time. She appears well-developed and well-nourished.   Presenting in wheelchair  HENT:  Head: Normocephalic.  Nose: Nose normal.  Mouth/Throat: Oropharynx is clear and moist.  Eyes: Conjunctivae are normal. Pupils are equal, round, and reactive to light.  Neck: Normal range of motion. Neck supple. No JVD present.  Cardiovascular: Normal rate, regular rhythm, S1 normal, S2 normal, normal heart sounds and intact distal pulses.  Exam reveals no gallop and no friction rub.   No murmur  heard. 2+ pitting edema to the thighs , regions of ecchymosis and sores on her legs , compression hose in place  Pulmonary/Chest: Effort normal and breath sounds normal. No respiratory distress. She has no wheezes. She has no rales. She exhibits no tenderness.  Abdominal: Soft. Bowel sounds are normal. She exhibits no distension. There is no tenderness.  Musculoskeletal: Normal range of motion. She exhibits no edema or tenderness.  Lymphadenopathy:    She has no cervical adenopathy.  Neurological: She is alert and oriented to person, place, and time. Coordination normal.  Skin: Skin is warm and dry. No rash noted.  No erythema.  Psychiatric: She has a normal mood and affect. Her behavior is normal. Judgment and thought content normal.    Assessment and Plan  Nursing note and vitals reviewed.

## 2015-09-29 NOTE — Assessment & Plan Note (Signed)
Chronic moderate shortness of breath, on home oxygen This may improve after diuresis

## 2015-09-29 NOTE — Assessment & Plan Note (Signed)
We have encouraged continued exercise, careful diet management   

## 2015-09-29 NOTE — Patient Instructions (Addendum)
You are doing well.  Please take chlorthalidone two days a week in the morning 30 minutes before your morning Bumex Still take bumex in the afternoon  Please take zetia once a day for cholesterol  Please call us if you have new issues that need to be addressed before your next appt.  Your physician wants you to follow-up in: 2 months.

## 2015-09-29 NOTE — Assessment & Plan Note (Signed)
Still with significant fluid overload on today's visit Some noncompliance with her Bumex Recommended she stay on Bumex 2 mg twice a day. We will add chlorthalidone 30 minutes prior to her morning Bumex 2 days per week. Recent BMP she had good kidney function, normal potassium Recommended a low-sodium diet.

## 2015-10-01 ENCOUNTER — Other Ambulatory Visit: Payer: Self-pay | Admitting: Hematology and Oncology

## 2015-10-02 ENCOUNTER — Encounter: Payer: Self-pay | Admitting: Hematology and Oncology

## 2015-10-02 NOTE — Progress Notes (Signed)
[facility] day:  06/16/2015  Chief Complaint: Catherine Holmes is a 81 y.o. female with iron deficiency anemia who is seen for reassessment.  HPI: The patient notes a history of anemia for years. She had previously been on oral iron intermittently over an unspecified period of time. Oral iron been problematic as it causes nausea and headaches.  She has thus received IV iron for documented iron deficiency.  She denies history of prior transfusions.  Available labs from 06/03/2012 revealed a hematocrit of 30, hemoglobin 9.4, and MCV 77. Iron saturation was 7%. Ferritin was 19.  B12 and folate were normal. Upper endoscopy in 2011 revealed a large hiatal hernia. Colonoscopy in 2006 was normal.   The patient was seen in the medical oncology clinic on 11/17/2014. At that time hematocrit was 33.9, hemoglobin 11, and MCV 91. She received Feraheme 510 mg IV.  She states that she was admitted from 04/10/2015 to 04/13/2015 at [facility] secondary to syncope, collapse, and hyponatremia. CBC revealed a hematocrit 27.2 hemoglobin 8.8 MCV 81.5 platelets 335,000 white count 9100. She received IV iron on 04/15/2015. Follow-up hematocrit was 31.9 with a hemoglobin of 10.1.  She states that her diet is good, although she hardly eats any meat. She eats vegetables.  Review of her medical records indicates Catherine Holmes has been given on several occasions (06/25/2013, 07/02/2013, 10/30/2013, 12/11/2013, 02/26/2014, 06/02/2014, 11/17/2014, and 04/15/2015).  She has had documented heme positive stool.  A decision has been made by her other physicians that because of her heart and lung issues, she cannot undergo further endoscopy.  She can dress on her own.  She requires supervision with her shower/bath.  She has a portable toilet.   Past Medical History  Diagnosis Date  . Essential hypertension   . Diabetes mellitus type II, controlled ([place])   . COPD (chronic obstructive pulmonary  disease) ([place])   . Osteoporosis   . Carotid arterial disease ([place])     a. 2012 Carotid dopplers: 40-59% R ICA, 60-79% L ICA  . Tic douloureux   . Arthralgia   . Sjoegren syndrome ([place])   . TIA (transient ischemic attack)   . Emphysema   . MVP (mitral valve prolapse)   . Chronic diastolic CHF (congestive heart failure) ([place])     a. 08/2011 Echo: EF 65-70%, mild LVH, mod dil LA, mildly dil RA, mild MR, mild-mod AoV Sclerosis w/o stenosis, mod-sev elev PA pressures, mild TR.  Marland Kitchen Acid reflux   . Diverticulitis   . Anemia   . History of pneumonia   . Syncope and collapse   . Anemia   . Hip fracture, left ([place])   . Hyperlipidemia   . Hiatal hernia   . Chest pain     a. 08/2011 MV: EF 74%, no ischemia.  . Noncompliance     a. h/o not taking bumex as Rx.  Marland Kitchen PAF (paroxysmal atrial fibrillation) ([facility])     a. CHA2DS2VASc = 8 ->No OAC 2/2 falls.    Past Surgical History  Procedure Laterality Date  . Cholecystectomy  1965  . Abdominal hysterectomy  1964  . Cataract extraction Bilateral   . Hip fracture surgery  2014    left hip    Family History  Problem Relation Age of Onset  . Heart attack Mother   . Heart attack Father   . Heart attack Sister     Social History:  reports that she quit smoking about 20 years ago.  Her smoking use included Cigarettes. She has a 15 pack-year smoking history. She has never used smokeless tobacco. She reports that she drinks alcohol. She reports that she does not use illicit drugs.  She lives with her daughter.  The patient is accompanied by her daughter, Catherine Holmes, today.  Allergies:  Allergies  Allergen Reactions  . Atorvastatin   . Clindamycin Diarrhea, Other (See Comments) and Nausea Only  . Codeine   . Doxycycline   . Epinephrine Hives and Itching  . Erythromycin   . Fluticasone-Salmeterol   . Lasix [Furosemide] Itching  . Levofloxacin   . Penicillins   . Propoxyphene Hcl   . Rosuvastatin   . Simvastatin   . Singlet  [Chlorphen-Pe-Acetaminophen] Other (See Comments)    Reaction: Wheezing  . Sulfamethoxazole-Trimethoprim   . Teriparatide   . Tetracyclines & Related Other (See Comments)    unknown  . Cefaclor Rash and Other (See Comments)    Reaction: Delirious    . Glipizide Hives  . Lisinopril Hives and Itching    Current Medications: Current Outpatient Prescriptions  Medication Sig Dispense Refill  . albuterol (PROAIR HFA) 108 (90 BASE) MCG/ACT inhaler Inhale 2 puffs into the lungs every 6 (six) hours as needed. 3 Inhaler 0  . albuterol (PROVENTIL) (2.5 MG/3ML) 0.083% nebulizer solution Take 2.5 mg by nebulization every 6 (six) hours as needed for wheezing or shortness of breath.     Marland Kitchen arformoterol (BROVANA) 15 MCG/2ML NEBU Take 2 mLs (15 mcg total) by nebulization 2 (two) times daily. 120 mL 4  . aspirin EC 81 MG tablet Take 81 mg by mouth daily.    . beta carotene w/minerals (OCUVITE) tablet Take 1 tablet by mouth 2 (two) times daily.    . calcium carbonate (OS-CAL) 600 MG TABS tablet Take 600 mg by mouth daily with breakfast.    . Cholecalciferol (VITAMIN D-3 PO) Take 1,000 Units by mouth.    . citalopram (CELEXA) 20 MG tablet Take 20 mg by mouth daily.    . diphenhydramine-acetaminophen (TYLENOL PM) 25-500 MG TABS Take 1 tablet by mouth at bedtime as needed.    . doxazosin (CARDURA) 4 MG tablet Take 1 tablet (4 mg total) by mouth daily.    Marland Kitchen ezetimibe (ZETIA) 10 MG tablet Take 5 mg by mouth daily.    . fluticasone (FLONASE) 50 MCG/ACT nasal spray Place 1 spray into both nostrils daily.    Marland Kitchen gabapentin (NEURONTIN) 100 MG capsule Take 100 mg by mouth 2 (two) times daily.     . hydrALAZINE (APRESOLINE) 50 MG tablet Take 50 mg by mouth as needed.     . mometasone-formoterol (DULERA) 100-5 MCG/ACT AERO Inhale 2 puffs into the lungs 2 (two) times daily.     . Multiple Vitamin (MULTIVITAMIN) tablet Take 1 tablet by mouth daily.      . mupirocin ointment (BACTROBAN) 2 % Apply topically.    .  nebivolol (BYSTOLIC) 5 MG tablet Take 1 tablet (5 mg total) by mouth daily. 30 tablet 0  . nitroGLYCERIN (NITROSTAT) 0.4 MG SL tablet Place 1 tablet (0.4 mg total) under the tongue every 5 (five) minutes as needed for chest pain. 25 tablet 6  . potassium chloride (K-DUR,KLOR-CON) 10 MEQ tablet Take 0.5 tablets (5 mEq total) by mouth 2 (two) times daily as needed. Patient takes this when she takes Bumex. 60 tablet 3  . tiotropium (SPIRIVA) 18 MCG inhalation capsule Place 1 capsule (18 mcg total) into inhaler and inhale daily. 90 capsule 0  .  bumetanide (BUMEX) 2 MG tablet Take 1 tablet (2 mg total) by mouth 2 (two) times daily as needed. Take 1 tab daily 60 tablet 11  . chlorthalidone (HYGROTON) 25 MG tablet Take 1 tablet (25 mg total) by mouth daily as needed. 30 tablet 6  . HYDROcodone-acetaminophen (NORCO/VICODIN) 5-325 MG tablet Take 1-2 tablets by mouth 2 (two) times daily as needed for moderate pain.    Marland Kitchen spironolactone (ALDACTONE) 50 MG tablet daily as needed. Takes 1 tablet Mon. Wednesday and Friday.     No current facility-administered medications for this visit.   Facility-Administered Medications Ordered in Other Visits  Medication Dose Route Frequency Provider Last Rate Last Dose  . 0.9 %  sodium chloride infusion   Intravenous Once Lequita Asal, MD      . alteplase (CATHFLO ACTIVASE) injection 2 mg  2 mg Intracatheter Once PRN Lequita Asal, MD      . ferumoxytol (FERAHEME) 510 mg in sodium chloride 0.9 % 100 mL IVPB  510 mg Intravenous Once Lequita Asal, MD      . heparin lock flush 100 unit/mL  500 Units Intracatheter Once PRN Lequita Asal, MD      . heparin lock flush 100 unit/mL  250 Units Intracatheter Once PRN Lequita Asal, MD      . sodium chloride 0.9 % injection 10 mL  10 mL Intracatheter PRN Lequita Asal, MD      . sodium chloride 0.9 % injection 3 mL  3 mL Intravenous Once PRN Lequita Asal, MD        Review of Systems:  GENERAL:   Feels tired and weak.  No fevers, sweats.  Weight gain secondary to immobility. PERFORMANCE STATUS (ECOG):  3 HEENT:  Hoarse.  No visual changes, runny nose, sore throat, mouth sores or tenderness. Lungs: Stress and getting up and doing stuff causes shortness of breath.  No cough.  No hemoptysis. Cardiac:  No chest pain, palpitations, orthopnea, or PND. GI:  Reflux.  No nausea, vomiting, diarrhea, constipation, melena or hematochezia. GU:  No urgency, frequency, dysuria, or hematuria. Musculoskeletal:  No back pain.  No joint pain.  No muscle tenderness. Extremities:  Shoulder pain s/p fracture.  Partial hip replacement.  No pain or swelling. Skin:  Few places on legs not healing, treated with Cephalexin and Mupiricin. Neuro:  No headache, numbness or weakness, balance or coordination issues. Endocrine:  No diabetes, thyroid issues, hot flashes or night sweats. Psych:  No mood changes, depression or anxiety. Pain:  No focal pain. Review of systems:  All other systems reviewed and found to be negative.  Physical Exam: Blood pressure 95/61, pulse 74, temperature 97.7 F (36.5 C), temperature source Tympanic, resp. rate 18, weight 163 lb 9.3 oz (74.2 kg). GENERAL:  Well developed, well nourished, sitting comfortably in a wheelchair in the exam room in no acute distress. MENTAL STATUS:  Alert and oriented to person, place and time. HEAD:  Black hair with gray highlights.  Normocephalic, atraumatic, face symmetric, no Cushingoid features. EYES:  Glasses.  Blue eyes.  Pupils equal round and reactive to light and accomodation.  No conjunctivitis or scleral icterus. ENT:  Oropharynx clear without lesion.  Partial dentures.  Tongue normal. Mucous membranes moist.  RESPIRATORY:  Clear to auscultation without rales, wheezes or rhonchi. CARDIOVASCULAR:  Regular rate and rhythm without murmur, rub or gallop. ABDOMEN:  Umbilical hernia.  Soft, non-tender, with active bowel sounds, and no  hepatosplenomegaly.  No masses.  SKIN:  Fragile skin.  No rashes, ulcers or lesions. EXTREMITIES: Lower extremity changes.  Ankle edema (left > right).  No palpable cords. LYMPH NODES: No palpable cervical, supraclavicular, axillary or inguinal adenopathy  NEUROLOGICAL: Unremarkable. PSYCH:  Appropriate.  Appointment on 06/16/2015  Component Date Value Ref Range Status  . WBC 06/16/2015 10.4  3.6 - 11.0 K/uL Final  . RBC 06/16/2015 3.35* 3.80 - 5.20 MIL/uL Final  . Hemoglobin 06/16/2015 9.4* 12.0 - 16.0 g/dL Final  . HCT 06/16/2015 28.6* 35.0 - 47.0 % Final  . MCV 06/16/2015 85.6  80.0 - 100.0 fL Final  . MCH 06/16/2015 28.0  26.0 - 34.0 pg Final  . MCHC 06/16/2015 32.7  32.0 - 36.0 g/dL Final  . RDW 06/16/2015 18.6* 11.5 - 14.5 % Final  . Platelets 06/16/2015 335  150 - 440 K/uL Final  . Neutrophils Relative % 06/16/2015 84   Final  . Neutro Abs 06/16/2015 8.8* 1.4 - 6.5 K/uL Final  . Lymphocytes Relative 06/16/2015 9   Final  . Lymphs Abs 06/16/2015 0.9* 1.0 - 3.6 K/uL Final  . Monocytes Relative 06/16/2015 6   Final  . Monocytes Absolute 06/16/2015 0.6  0.2 - 0.9 K/uL Final  . Eosinophils Relative 06/16/2015 1   Final  . Eosinophils Absolute 06/16/2015 0.1  0 - 0.7 K/uL Final  . Basophils Relative 06/16/2015 0   Final  . Basophils Absolute 06/16/2015 0.0  0 - 0.1 K/uL Final  . Ferritin 06/16/2015 22  11 - 307 ng/mL Final  . Iron 06/16/2015 55  28 - 170 ug/dL Final  . TIBC 06/16/2015 389  250 - 450 ug/dL Final  . Saturation Ratios 06/16/2015 14  10.4 - 31.8 % Final  . UIBC 06/16/2015 334   Final  . Sed Rate 06/16/2015 46* 0 - 30 mm/hr Final    Assessment:  Catherine Holmes is a 81 y.o. female with recurrent iron deficiency and intolerance to oral iron.  Diet is modest.  Upper endoscopy in 2011 revealed a large hiatal hernia. Colonoscopy in 2006 was normal. She has had recurrent guaiac positive stools.  Because of her health, she is unable to undergo follow-up endoscopies.  She  has received Ferahame on several occasions (06/25/2013, 07/02/2013, 10/30/2013, 12/11/2013, 02/26/2014, 06/02/2014, 11/17/2014, and 04/15/2015) with improvement in her hematocrit.  Symptomatically, she is fatigued.  She has shortness of breath with any activities.  Plan: 1.  Review entire medical history, diagnosis and treatment of iron deficiency anemia.  Discuss diet.  Discuss inability to undergo further upper and lower endoscopies. 2.  Labs today:  CBC with diff, ferritin, iron studies, ESR. 3.  Schedule Feraheme 510 mg IV. 4.  RTC in 1 month for labs (CBC with diff, ferritin). 5.  RTC in 3 months for MD assessment and labs (CBC with diff, ferritin, iron studies).   Lequita Asal, MD  06/16/2015

## 2015-10-02 NOTE — Progress Notes (Addendum)
[facility] day:  09/02/2015  Chief Complaint: Catherine Holmes is a 81 y.o. female with iron deficiency anemia who is seen for 3 month ssessment.  HPI: The patient was last seen in medical oncology clinic on 06/16/2015. At that time she was seen for initial assessment by me. She had a documented iron deficiency.  She was intolerant of oral iron and had received several infusions of Feraheme in the past.  She had a history of recurrent guaiac positive stool.  Because of her general health, she is unable to undergo follow-up endoscopies.   At last visit, hematocrit was 28.6, hemoglobin 9.4, and MCV 85.6. Ferritin was 22 with 14% iron saturation and a TIBC of 381. She received 510 mg IV Feraheme on 06/17/2015.  She states that she was diagnosed with pneumonia on 07/19/2015. [place]al [place]  She has an esophageal stricture.  She was admitted to [facility] from 07/28/2015 until 07/29/2015 with acute on chronic respiratory failure, hypoxia, and hypercapnia. She was treated with oxygen, steroids, antibiotics, and nebulizer treatments.  Symptomatically, she notes no energy. She is not eating much meat. She denies any melena or hematochezia.   Past Medical History  Diagnosis Date  . Essential hypertension   . Diabetes mellitus type II, controlled ([place])   . COPD (chronic obstructive pulmonary disease) ([place])   . Osteoporosis   . Carotid arterial disease ([place])     a. 2012 Carotid dopplers: 40-59% R ICA, 60-79% L ICA  . Tic douloureux   . Arthralgia   . Sjoegren syndrome ([place])   . TIA (transient ischemic attack)   . Emphysema   . MVP (mitral valve prolapse)   . Chronic diastolic CHF (congestive heart failure) ([place])     a. 08/2011 Echo: EF 65-70%, mild LVH, mod dil LA, mildly dil RA, mild MR, mild-mod AoV Sclerosis w/o stenosis, mod-sev elev PA pressures, mild TR.  Marland Kitchen Acid reflux   . Diverticulitis   . Anemia    . History of pneumonia   . Syncope and collapse   . Anemia   . Hip fracture, left ([place])   . Hyperlipidemia   . Hiatal hernia   . Chest pain     a. 08/2011 MV: EF 74%, no ischemia.  . Noncompliance     a. h/o not taking bumex as Rx.  Marland Kitchen PAF (paroxysmal atrial fibrillation) ([facility])     a. CHA2DS2VASc = 8 ->No OAC 2/2 falls.    Past Surgical History  Procedure Laterality Date  . Cholecystectomy  1965  . Abdominal hysterectomy  1964  . Cataract extraction Bilateral   . Hip fracture surgery  2014    left hip    Family History  Problem Relation Age of Onset  . Heart attack Mother   . Heart attack Father   . Heart attack Sister     Social History:  reports that she quit smoking about 20 years ago. Her smoking use included Cigarettes. She has a 15 pack-year smoking history. She has never used smokeless tobacco. She reports that she drinks alcohol. She reports that she does not use illicit drugs.  She lives with her daughter.  The patient is accompanied by her daughter, [name], today.  Allergies:  Allergies  Allergen Reactions  . Atorvastatin   . Clindamycin Diarrhea, Other (See Comments) and Nausea Only  . Codeine   . Doxycycline   . Epinephrine Hives and  Itching  . Erythromycin   . Fluticasone-Salmeterol   . Lasix [Furosemide] Itching  . Levofloxacin   . Penicillins   . Propoxyphene Hcl   . Rosuvastatin   . Simvastatin   . Singlet [Chlorphen-Pe-Acetaminophen] Other (See Comments)    Reaction: Wheezing  . Sulfamethoxazole-Trimethoprim   . Teriparatide   . Tetracyclines & Related Other (See Comments)    unknown  . Cefaclor Rash and Other (See Comments)    Reaction: Delirious    . Glipizide Hives  . Lisinopril Hives and Itching    Current Medications: Current Outpatient Prescriptions  Medication Sig Dispense Refill  . albuterol (PROAIR HFA) 108 (90 BASE) MCG/ACT inhaler Inhale 2 puffs into the lungs every 6 (six) hours as needed. 3 Inhaler 0  . arformoterol  (BROVANA) 15 MCG/2ML NEBU Take 2 mLs (15 mcg total) by nebulization 2 (two) times daily. 120 mL 4  . aspirin EC 81 MG tablet Take 81 mg by mouth daily.    . beta carotene w/minerals (OCUVITE) tablet Take 1 tablet by mouth 2 (two) times daily.    . bumetanide (BUMEX) 2 MG tablet Take 1 tablet (2 mg total) by mouth 2 (two) times daily as needed. Take 1 tab daily 60 tablet 11  . calcium carbonate (OS-CAL) 600 MG TABS tablet Take 600 mg by mouth daily with breakfast.    . Cholecalciferol (VITAMIN D-3 PO) Take 1,000 Units by mouth.    . citalopram (CELEXA) 20 MG tablet Take 20 mg by mouth daily.    . diphenhydramine-acetaminophen (TYLENOL PM) 25-500 MG TABS Take 1 tablet by mouth at bedtime as needed.    . doxazosin (CARDURA) 4 MG tablet Take 1 tablet (4 mg total) by mouth daily.    Marland Kitchen ezetimibe (ZETIA) 10 MG tablet Take 5 mg by mouth daily.    . fluticasone (FLONASE) 50 MCG/ACT nasal spray Place 1 spray into both nostrils daily.    Marland Kitchen HYDROcodone-acetaminophen (NORCO/VICODIN) 5-325 MG tablet Take 1-2 tablets by mouth 2 (two) times daily as needed for moderate pain.    . mometasone-formoterol (DULERA) 100-5 MCG/ACT AERO Inhale 2 puffs into the lungs 2 (two) times daily.     . Multiple Vitamin (MULTIVITAMIN) tablet Take 1 tablet by mouth daily.      . mupirocin ointment (BACTROBAN) 2 % Apply topically.    . nebivolol (BYSTOLIC) 5 MG tablet Take 1 tablet (5 mg total) by mouth daily. 30 tablet 0  . nitroGLYCERIN (NITROSTAT) 0.4 MG SL tablet Place 1 tablet (0.4 mg total) under the tongue every 5 (five) minutes as needed for chest pain. 25 tablet 6  . potassium chloride (K-DUR,KLOR-CON) 10 MEQ tablet Take 0.5 tablets (5 mEq total) by mouth 2 (two) times daily as needed. Patient takes this when she takes Bumex. 60 tablet 3  . spironolactone (ALDACTONE) 50 MG tablet daily as needed. Takes 1 tablet Mon. Wednesday and Friday.    . tiotropium (SPIRIVA) 18 MCG inhalation capsule Place 1 capsule (18 mcg total) into  inhaler and inhale daily. 90 capsule 0  . albuterol (PROVENTIL) (2.5 MG/3ML) 0.083% nebulizer solution Take 2.5 mg by nebulization every 6 (six) hours as needed for wheezing or shortness of breath.     . chlorthalidone (HYGROTON) 25 MG tablet Take 1 tablet (25 mg total) by mouth daily as needed. 30 tablet 6  . gabapentin (NEURONTIN) 100 MG capsule Take 100 mg by mouth 2 (two) times daily.     . hydrALAZINE (APRESOLINE) 50 MG tablet Take 50  mg by mouth as needed.      No current facility-administered medications for this visit.   Facility-Administered Medications Ordered in Other Visits  Medication Dose Route Frequency Provider Last Rate Last Dose  . 0.9 %  sodium chloride infusion   Intravenous Once Lequita Asal, MD      . alteplase (CATHFLO ACTIVASE) injection 2 mg  2 mg Intracatheter Once PRN Lequita Asal, MD      . ferumoxytol (FERAHEME) 510 mg in sodium chloride 0.9 % 100 mL IVPB  510 mg Intravenous Once Lequita Asal, MD      . heparin lock flush 100 unit/mL  500 Units Intracatheter Once PRN Lequita Asal, MD      . heparin lock flush 100 unit/mL  250 Units Intracatheter Once PRN Lequita Asal, MD      . sodium chloride 0.9 % injection 10 mL  10 mL Intracatheter PRN Lequita Asal, MD      . sodium chloride 0.9 % injection 3 mL  3 mL Intravenous Once PRN Lequita Asal, MD        Review of Systems:  GENERAL:  No energy.  No fevers, sweats.  Weight gain secondary to fluid. PERFORMANCE STATUS (ECOG):  3 HEENT:  No visual changes, runny nose, sore throat, mouth sores or tenderness. Lungs:  Shortness of breath.  No cough.  No hemoptysis. Cardiac:  No chest pain, palpitations, orthopnea, or PND. GI:  Reflux.  Diarrhea then 2 days of constipation.  No nausea, vomiting, melena or hematochezia. GU:  No urgency, frequency, dysuria, or hematuria. Musculoskeletal:  Back pain.  Sternal fracture s/p CPR.  No joint pain.  No muscle tenderness. Extremities:  Lower  extremity swelling on fluid pills. Skin:  No rashes or ulcers. Neuro:  No headache, numbness or weakness, balance or coordination issues. Endocrine:  No diabetes, thyroid issues, hot flashes or night sweats. Psych:  No mood changes, depression or anxiety. Pain:  No focal pain. Review of systems:  All other systems reviewed and found to be negative.  Physical Exam: Blood pressure 135/67, pulse 76, temperature 97.1 F (36.2 C), temperature source Tympanic, weight 144 lb 6.4 oz (65.5 kg), SpO2 90 %, peak flow 2 L/min. GENERAL:  Elderly woman sitting comfortably in a wheelchair in the exam room in no acute distress. MENTAL STATUS:  Alert and oriented to person, place and time. HEAD:  Black hair with gray highlights.  Normocephalic, atraumatic, face symmetric, no Cushingoid features. EYES:  Glasses propped on head.  Blue eyes.  Pupils equal round and reactive to light and accomodation.  No conjunctivitis or scleral icterus. ENT:  [place] in place.  Oropharynx clear without lesion.  Partial dentures.  Tongue normal. Mucous membranes moist.  RESPIRATORY:  Crackles bilateral lower lobes (right > left).  No wheezes or rhonchi. CARDIOVASCULAR:  Regular rate and rhythm without murmur, rub or gallop. ABDOMEN:  Umbilical hernia.  Soft, non-tender, with active bowel sounds, and no hepatosplenomegaly.  No masses. SKIN:  Fragile skin.  No rashes, ulcers or lesions. EXTREMITIES: 3+ bilateral lower extremity edema.  No palpable cords. LYMPH NODES: No palpable cervical, supraclavicular, axillary or inguinal adenopathy  NEUROLOGICAL: Unremarkable. PSYCH:  Appropriate.  Orders Only on 09/02/2015  Component Date Value Ref Range Status  . WBC 09/02/2015 7.4  3.6 - 11.0 K/uL Final  . RBC 09/02/2015 3.49* 3.80 - 5.20 MIL/uL Final  . Hemoglobin 09/02/2015 9.1* 12.0 - 16.0 g/dL Final  . HCT 09/02/2015 29.1* 35.0 -  47.0 % Final  . MCV 09/02/2015 83.5  80.0 - 100.0 fL Final  . MCH 09/02/2015 26.2  26.0 - 34.0 pg Final   . MCHC 09/02/2015 31.4* 32.0 - 36.0 g/dL Final  . RDW 09/02/2015 19.5* 11.5 - 14.5 % Final  . Platelets 09/02/2015 375  150 - 440 K/uL Final  . Neutrophils Relative % 09/02/2015 75   Final  . Neutro Abs 09/02/2015 5.5  1.4 - 6.5 K/uL Final  . Lymphocytes Relative 09/02/2015 15   Final  . Lymphs Abs 09/02/2015 1.1  1.0 - 3.6 K/uL Final  . Monocytes Relative 09/02/2015 9   Final  . Monocytes Absolute 09/02/2015 0.6  0.2 - 0.9 K/uL Final  . Eosinophils Relative 09/02/2015 1   Final  . Eosinophils Absolute 09/02/2015 0.1  0 - 0.7 K/uL Final  . Basophils Relative 09/02/2015 0   Final  . Basophils Absolute 09/02/2015 0.0  0 - 0.1 K/uL Final  . Ferritin 09/02/2015 22  11 - 307 ng/mL Final  . Iron 09/02/2015 38  28 - 170 ug/dL Final  . TIBC 09/02/2015 361  250 - 450 ug/dL Final  . Saturation Ratios 09/02/2015 11  10.4 - 31.8 % Final  . UIBC 09/02/2015 324   Final    Assessment:  Catherine Holmes is a 81 y.o. female with recurrent iron deficiency and intolerance to oral iron.  Diet is modest.  Upper endoscopy in 2011 revealed a large hiatal hernia. Colonoscopy in 2006 was normal. She has had recurrent guaiac positive stools.  Because of her health, she is unable to undergo follow-up endoscopies.  She has received Ferahame on several occasions (06/25/2013, 07/02/2013, 10/30/2013, 12/11/2013, 02/26/2014, 06/02/2014, 11/17/2014, 04/15/2015, and 06/17/2015) with improvement in her hematocrit.  Symptomatically, she remains fatigued.  Diet is modest.  She denies any bleeding.  She is anemic.  Iron stores are low (ferritin 22).    Plan: 1.  Labs today:  CBC with diff, ferritin, iron studies, ESR. 2.  Feraheme 510 mg IV today. 3.  RTC in 1 month for MD assessment and labs (CBC with diff, ferritin, iron studies, +/- others).   Lequita Asal, MD  09/02/2015

## 2015-10-03 ENCOUNTER — Inpatient Hospital Stay: Payer: Medicare Other | Attending: Hematology and Oncology | Admitting: Hematology and Oncology

## 2015-10-03 ENCOUNTER — Other Ambulatory Visit: Payer: Self-pay

## 2015-10-03 ENCOUNTER — Inpatient Hospital Stay: Payer: Medicare Other

## 2015-10-03 VITALS — BP 130/60 | HR 68 | Temp 97.7°F | Resp 18 | Ht 60.0 in | Wt 160.7 lb

## 2015-10-03 DIAGNOSIS — Z8781 Personal history of (healed) traumatic fracture: Secondary | ICD-10-CM | POA: Diagnosis not present

## 2015-10-03 DIAGNOSIS — D649 Anemia, unspecified: Secondary | ICD-10-CM

## 2015-10-03 DIAGNOSIS — K5790 Diverticulosis of intestine, part unspecified, without perforation or abscess without bleeding: Secondary | ICD-10-CM | POA: Diagnosis not present

## 2015-10-03 DIAGNOSIS — I10 Essential (primary) hypertension: Secondary | ICD-10-CM

## 2015-10-03 DIAGNOSIS — Z7982 Long term (current) use of aspirin: Secondary | ICD-10-CM | POA: Diagnosis not present

## 2015-10-03 DIAGNOSIS — I5032 Chronic diastolic (congestive) heart failure: Secondary | ICD-10-CM | POA: Insufficient documentation

## 2015-10-03 DIAGNOSIS — Z87891 Personal history of nicotine dependence: Secondary | ICD-10-CM | POA: Diagnosis not present

## 2015-10-03 DIAGNOSIS — I341 Nonrheumatic mitral (valve) prolapse: Secondary | ICD-10-CM | POA: Insufficient documentation

## 2015-10-03 DIAGNOSIS — J449 Chronic obstructive pulmonary disease, unspecified: Secondary | ICD-10-CM | POA: Diagnosis not present

## 2015-10-03 DIAGNOSIS — Z9119 Patient's noncompliance with other medical treatment and regimen: Secondary | ICD-10-CM | POA: Diagnosis not present

## 2015-10-03 DIAGNOSIS — Z79899 Other long term (current) drug therapy: Secondary | ICD-10-CM | POA: Insufficient documentation

## 2015-10-03 DIAGNOSIS — E119 Type 2 diabetes mellitus without complications: Secondary | ICD-10-CM | POA: Insufficient documentation

## 2015-10-03 DIAGNOSIS — K449 Diaphragmatic hernia without obstruction or gangrene: Secondary | ICD-10-CM

## 2015-10-03 DIAGNOSIS — G500 Trigeminal neuralgia: Secondary | ICD-10-CM | POA: Insufficient documentation

## 2015-10-03 DIAGNOSIS — D509 Iron deficiency anemia, unspecified: Secondary | ICD-10-CM | POA: Diagnosis present

## 2015-10-03 DIAGNOSIS — K219 Gastro-esophageal reflux disease without esophagitis: Secondary | ICD-10-CM | POA: Diagnosis not present

## 2015-10-03 DIAGNOSIS — J439 Emphysema, unspecified: Secondary | ICD-10-CM | POA: Insufficient documentation

## 2015-10-03 DIAGNOSIS — R5383 Other fatigue: Secondary | ICD-10-CM | POA: Insufficient documentation

## 2015-10-03 DIAGNOSIS — I480 Paroxysmal atrial fibrillation: Secondary | ICD-10-CM | POA: Insufficient documentation

## 2015-10-03 DIAGNOSIS — Z8701 Personal history of pneumonia (recurrent): Secondary | ICD-10-CM

## 2015-10-03 DIAGNOSIS — M3500 Sicca syndrome, unspecified: Secondary | ICD-10-CM | POA: Diagnosis not present

## 2015-10-03 DIAGNOSIS — I2510 Atherosclerotic heart disease of native coronary artery without angina pectoris: Secondary | ICD-10-CM | POA: Insufficient documentation

## 2015-10-03 DIAGNOSIS — Z8673 Personal history of transient ischemic attack (TIA), and cerebral infarction without residual deficits: Secondary | ICD-10-CM | POA: Insufficient documentation

## 2015-10-03 DIAGNOSIS — M818 Other osteoporosis without current pathological fracture: Secondary | ICD-10-CM | POA: Insufficient documentation

## 2015-10-03 LAB — IRON AND TIBC
Iron: 35 ug/dL (ref 28–170)
Saturation Ratios: 14 % (ref 10.4–31.8)
TIBC: 259 ug/dL (ref 250–450)
UIBC: 224 ug/dL

## 2015-10-03 LAB — CBC WITH DIFFERENTIAL/PLATELET
Basophils Absolute: 0 10*3/uL (ref 0–0.1)
Basophils Relative: 1 %
Eosinophils Absolute: 0.1 10*3/uL (ref 0–0.7)
Eosinophils Relative: 2 %
HCT: 32.7 % — ABNORMAL LOW (ref 35.0–47.0)
Hemoglobin: 10.4 g/dL — ABNORMAL LOW (ref 12.0–16.0)
Lymphocytes Relative: 14 %
Lymphs Abs: 0.9 10*3/uL — ABNORMAL LOW (ref 1.0–3.6)
MCH: 27.5 pg (ref 26.0–34.0)
MCHC: 31.8 g/dL — ABNORMAL LOW (ref 32.0–36.0)
MCV: 86.4 fL (ref 80.0–100.0)
Monocytes Absolute: 0.4 10*3/uL (ref 0.2–0.9)
Monocytes Relative: 6 %
Neutro Abs: 4.9 10*3/uL (ref 1.4–6.5)
Neutrophils Relative %: 77 %
Platelets: 268 10*3/uL (ref 150–440)
RBC: 3.79 MIL/uL — ABNORMAL LOW (ref 3.80–5.20)
RDW: 21 % — ABNORMAL HIGH (ref 11.5–14.5)
WBC: 6.3 10*3/uL (ref 3.6–11.0)

## 2015-10-03 LAB — FERRITIN: Ferritin: 100 ng/mL (ref 11–307)

## 2015-10-03 NOTE — Progress Notes (Signed)
[facility]  [facility]cer Center  Clinic day:  10/03/2015  Chief Complaint: Catherine Holmes is a 81 y.o. female with iron deficiency anemia who is seen for 1 month ssessment.  HPI: The patient was last seen in medical oncology clinic on 09/02/2015.  At that time she was seen for 3 month assessment.  Symptomatically, she noted no energy. She was not eating much meat. She denied any melena or hematochezia.  Labs included hematocrit of 29.1, hemoglobin 9.1, MCV 83.5, platelets 375,000, WBC 7400 with an ANC of 5500.  Ferritin was 22.  Iron saturation was 11%.  She received Feraheme that day.  During the interim, she feelsbad. She describes being weak and having trouble walking.  She is eating spinach.  She is off her antibiotics. She plans on starting physical therapy.   Past Medical History  Diagnosis Date  . Essential hypertension   . Diabetes mellitus type II, controlled ([facility])   . COPD (chronic obstructive pulmonary disease) ([facility])   . Osteoporosis   . Carotid arterial disease ([facility])     a. 2012 Carotid dopplers: 40-59% R ICA, 60-79% L ICA  . Tic douloureux   . Arthralgia   . Sjoegren syndrome ([facility])   . TIA (transient ischemic attack)   . Emphysema   . MVP (mitral valve prolapse)   . Chronic diastolic CHF (congestive heart failure) ([facility])     a. 08/2011 Echo: EF 65-70%, mild LVH, mod dil LA, mildly dil RA, mild MR, mild-mod AoV Sclerosis w/o stenosis, mod-sev elev PA pressures, mild TR.  Marland Kitchen. Acid reflux   . Diverticulitis   . Anemia   . History of pneumonia   . Syncope and collapse   . Anemia   . Hip fracture, left ([facility])   . Hyperlipidemia   . Hiatal hernia   . Chest pain     a. 08/2011 MV: EF 74%, no ischemia.  . Noncompliance     a. h/o not taking bumex as Rx.  Marland Kitchen. PAF (paroxysmal atrial fibrillation) ([facility])     a. CHA2DS2VASc = 8 ->No OAC 2/2 falls.    Past Surgical History  Procedure Laterality Date  . Cholecystectomy  1965  . Abdominal hysterectomy  1964  .  Cataract extraction Bilateral   . Hip fracture surgery  2014    left hip    Family History  Problem Relation Age of Onset  . Heart attack Mother   . Heart attack Father   . Heart attack Sister     Social History:  reports that she quit smoking about 21 years ago. Her smoking use included Cigarettes. She has a 15 pack-year smoking history. She has never used smokeless tobacco. She reports that she drinks alcohol. She reports that she does not use illicit drugs.  She lives with her daughter.  The patient is accompanied by her daughter, Eunice BlaseDebbie, today.  Allergies:  Allergies  Allergen Reactions  . Atorvastatin   . Clindamycin Diarrhea, Other (See Comments) and Nausea Only  . Codeine   . Doxycycline   . Epinephrine Hives and Itching  . Erythromycin   . Fluticasone-Salmeterol   . Lasix [Furosemide] Itching  . Levofloxacin   . Penicillins   . Propoxyphene Hcl   . Rosuvastatin   . Simvastatin   . Singlet [Chlorphen-Pe-Acetaminophen] Other (See Comments)    Reaction: Wheezing  . Sulfamethoxazole-Trimethoprim   . Teriparatide   . Tetracyclines & Related Other (See Comments)    unknown  . Cefaclor Rash and  Other (See Comments)    Reaction: Delirious    . Glipizide Hives  . Lisinopril Hives and Itching    Current Medications: Current Outpatient Prescriptions  Medication Sig Dispense Refill  . albuterol (PROAIR HFA) 108 (90 BASE) MCG/ACT inhaler Inhale 2 puffs into the lungs every 6 (six) hours as needed. 3 Inhaler 0  . albuterol (PROVENTIL) (2.5 MG/3ML) 0.083% nebulizer solution Take 2.5 mg by nebulization every 6 (six) hours as needed for wheezing or shortness of breath.     Marland Kitchen arformoterol (BROVANA) 15 MCG/2ML NEBU Take 2 mLs (15 mcg total) by nebulization 2 (two) times daily. 120 mL 4  . aspirin EC 81 MG tablet Take 81 mg by mouth daily.    . beta carotene w/minerals (OCUVITE) tablet Take 1 tablet by mouth 2 (two) times daily.    . bumetanide (BUMEX) 2 MG tablet Take 1 tablet  (2 mg total) by mouth 2 (two) times daily as needed. Take 1 tab daily 60 tablet 11  . calcium carbonate (OS-CAL) 600 MG TABS tablet Take 600 mg by mouth daily with breakfast.    . chlorthalidone (HYGROTON) 25 MG tablet Take 1 tablet (25 mg total) by mouth daily as needed. 30 tablet 6  . Cholecalciferol (VITAMIN D-3 PO) Take 1,000 Units by mouth.    . citalopram (CELEXA) 20 MG tablet Take 20 mg by mouth daily.    . diphenhydramine-acetaminophen (TYLENOL PM) 25-500 MG TABS Take 1 tablet by mouth at bedtime as needed.    . doxazosin (CARDURA) 4 MG tablet Take 1 tablet (4 mg total) by mouth daily.    Marland Kitchen HYDROcodone-acetaminophen (NORCO/VICODIN) 5-325 MG tablet Take 1-2 tablets by mouth 2 (two) times daily as needed for moderate pain.    . mometasone-formoterol (DULERA) 100-5 MCG/ACT AERO Inhale 2 puffs into the lungs 2 (two) times daily.     . Multiple Vitamin (MULTIVITAMIN) tablet Take 1 tablet by mouth daily.      . mupirocin ointment (BACTROBAN) 2 % Apply topically.    . nebivolol (BYSTOLIC) 5 MG tablet Take 1 tablet (5 mg total) by mouth daily. 30 tablet 0  . potassium chloride (K-DUR,KLOR-CON) 10 MEQ tablet Take 0.5 tablets (5 mEq total) by mouth 2 (two) times daily as needed. Patient takes this when she takes Bumex. 60 tablet 3  . spironolactone (ALDACTONE) 50 MG tablet daily as needed. Takes 1 tablet Mon. Wednesday and Friday.    . tiotropium (SPIRIVA) 18 MCG inhalation capsule Place 1 capsule (18 mcg total) into inhaler and inhale daily. 90 capsule 0  . ezetimibe (ZETIA) 10 MG tablet Take 5 mg by mouth daily.    . fluticasone (FLONASE) 50 MCG/ACT nasal spray Place 1 spray into both nostrils daily.    Marland Kitchen gabapentin (NEURONTIN) 100 MG capsule Take 100 mg by mouth 2 (two) times daily.     . hydrALAZINE (APRESOLINE) 50 MG tablet Take 50 mg by mouth as needed.     . nitroGLYCERIN (NITROSTAT) 0.4 MG SL tablet PLACE 1 TABLET (0.4 MG TOTAL) UNDER THE TONGUE EVERY 5 (FIVE) MINUTES AS NEEDED FOR CHEST  PAIN. 25 tablet 3   No current facility-administered medications for this visit.   Facility-Administered Medications Ordered in Other Visits  Medication Dose Route Frequency Provider Last Rate Last Dose  . 0.9 %  sodium chloride infusion   Intravenous Once Rosey Bath, MD      . alteplase (CATHFLO ACTIVASE) injection 2 mg  2 mg Intracatheter Once PRN Rosey Bath,  MD      . ferumoxytol (FERAHEME) 510 mg in sodium chloride 0.9 % 100 mL IVPB  510 mg Intravenous Once Rosey Bath, MD      . heparin lock flush 100 unit/mL  500 Units Intracatheter Once PRN Rosey Bath, MD      . heparin lock flush 100 unit/mL  250 Units Intracatheter Once PRN Rosey Bath, MD      . sodium chloride 0.9 % injection 10 mL  10 mL Intracatheter PRN Rosey Bath, MD      . sodium chloride 0.9 % injection 3 mL  3 mL Intravenous Once PRN Rosey Bath, MD        Review of Systems:  GENERAL:  Fatigue.  No fevers, sweats.  Weight stable. PERFORMANCE STATUS (ECOG):  3 HEENT:  No visual changes, runny nose, sore throat, mouth sores or tenderness. Lungs:  No increased shortness of breath.  No cough.  No hemoptysis. Cardiac:  No chest pain, palpitations, orthopnea, or PND. GI:  Reflux.  Diarrhea then 2 days of constipation.  No nausea, vomiting, melena or hematochezia. GU:  No urgency, frequency, dysuria, or hematuria. Musculoskeletal:  Back pain.  Sternal fracture s/p CPR.  No joint pain.  No muscle tenderness. Extremities:  Lower extremity swelling on fluid pills. Skin:  No rashes or ulcers. Neuro:  No headache, numbness or weakness, balance or coordination issues. Endocrine:  No diabetes, thyroid issues, hot flashes or night sweats. Psych:  No mood changes, depression or anxiety. Pain:  No focal pain. Review of systems:  All other systems reviewed and found to be negative.  Physical Exam: Blood pressure 130/60, pulse 68, temperature 97.7 F (36.5 C), temperature source  Tympanic, resp. rate 18, height 5' (1.524 m), weight 160 lb 11.5 oz (72.9 kg), SpO2 92 %. GENERAL:  Elderly woman sitting comfortably in a wheelchair in the exam room in no acute distress. MENTAL STATUS:  Alert and oriented to person, place and time. HEAD:  Black hair with gray highlights.  Normocephalic, atraumatic, face symmetric, no Cushingoid features. EYES:  Glasses.  Blue eyes.  Pupils equal round and reactive to light and accomodation.  No conjunctivitis or scleral icterus. ENT:  [place] in place.  Oropharynx clear without lesion.  Partial dentures.  Tongue normal. Mucous membranes moist.  RESPIRATORY:  Faint crackles left lower lobe.  No wheezes or rhonchi. CARDIOVASCULAR:  Regular rate and rhythm without murmur, rub or gallop. ABDOMEN:  Umbilical hernia.  Soft, non-tender, with active bowel sounds, and no hepatosplenomegaly.  No masses. SKIN:  Fragile skin.  No rashes, ulcers or lesions. EXTREMITIES: Bilateral lower extremity edema (left > right).  No palpable cords. LYMPH NODES: No palpable cervical, supraclavicular, axillary or inguinal adenopathy  NEUROLOGICAL: Unremarkable. PSYCH:  Appropriate.  Appointment on 10/03/2015  Component Date Value Ref Range Status  . WBC 10/03/2015 6.3  3.6 - 11.0 K/uL Final  . RBC 10/03/2015 3.79* 3.80 - 5.20 MIL/uL Final  . Hemoglobin 10/03/2015 10.4* 12.0 - 16.0 g/dL Final  . HCT 16/07/9603 32.7* 35.0 - 47.0 % Final  . MCV 10/03/2015 86.4  80.0 - 100.0 fL Final  . MCH 10/03/2015 27.5  26.0 - 34.0 pg Final  . MCHC 10/03/2015 31.8* 32.0 - 36.0 g/dL Final  . RDW 54/06/8118 21.0* 11.5 - 14.5 % Final  . Platelets 10/03/2015 268  150 - 440 K/uL Final  . Neutrophils Relative % 10/03/2015 77   Final  . Neutro Abs 10/03/2015 4.9  1.4 -  6.5 K/uL Final  . Lymphocytes Relative 10/03/2015 14   Final  . Lymphs Abs 10/03/2015 0.9* 1.0 - 3.6 K/uL Final  . Monocytes Relative 10/03/2015 6   Final  . Monocytes Absolute 10/03/2015 0.4  0.2 - 0.9 K/uL Final  .  Eosinophils Relative 10/03/2015 2   Final  . Eosinophils Absolute 10/03/2015 0.1  0 - 0.7 K/uL Final  . Basophils Relative 10/03/2015 1   Final  . Basophils Absolute 10/03/2015 0.0  0 - 0.1 K/uL Final  . Ferritin 10/03/2015 100  11 - 307 ng/mL Final  . Iron 10/03/2015 35  28 - 170 ug/dL Final  . TIBC 16/07/9603 259  250 - 450 ug/dL Final  . Saturation Ratios 10/03/2015 14  10.4 - 31.8 % Final  . UIBC 10/03/2015 224   Final    Assessment:  Sheleen Conchas Wieser is a 81 y.o. female with recurrent iron deficiency and intolerance to oral iron.  Diet is modest.  Upper endoscopy in 2011 revealed a large hiatal hernia. Colonoscopy in 2006 was normal. She has had recurrent guaiac positive stools.  Because of her health, she is unable to undergo follow-up endoscopies.  She has received Ferahame on several occasions (06/25/2013, 07/02/2013, 10/30/2013, 12/11/2013, 02/26/2014, 06/02/2014, 11/17/2014, 04/15/2015, 06/17/2015, and 09/02/2015) with improvement in her hematocrit.  Symptomatically, she remains fatigued.  Diet is modest.  She denies any bleeding.   Ferritin is 100.    Plan: 1.  Labs today:  CBC with diff, ferritin, iron studies. 2.  RTC in 2 months for labs (CBC with diff, ferritin) +/- Feraheme. 3.  RTC in 4 months for MD assessment and labs (CBC with diff, ferritin, iron studies) +/-  Feraheme.   Rosey Bath, MD  10/03/2015, 1:41 PM

## 2015-10-11 ENCOUNTER — Ambulatory Visit
Admission: RE | Admit: 2015-10-11 | Discharge: 2015-10-11 | Disposition: A | Discharge status: Home / Self Care | Payer: Medicare Other | Source: Ambulatory Visit | Attending: Internal Medicine | Admitting: Internal Medicine

## 2015-10-11 ENCOUNTER — Other Ambulatory Visit: Payer: Self-pay | Admitting: Internal Medicine

## 2015-10-11 DIAGNOSIS — Z8701 Personal history of pneumonia (recurrent): Secondary | ICD-10-CM | POA: Insufficient documentation

## 2015-10-11 DIAGNOSIS — I517 Cardiomegaly: Secondary | ICD-10-CM | POA: Diagnosis not present

## 2015-10-11 DIAGNOSIS — Z09 Encounter for follow-up examination after completed treatment for conditions other than malignant neoplasm: Secondary | ICD-10-CM | POA: Insufficient documentation

## 2015-10-11 DIAGNOSIS — J189 Pneumonia, unspecified organism: Secondary | ICD-10-CM

## 2015-10-11 DIAGNOSIS — J449 Chronic obstructive pulmonary disease, unspecified: Secondary | ICD-10-CM | POA: Insufficient documentation

## 2015-10-12 ENCOUNTER — Other Ambulatory Visit: Payer: Self-pay | Admitting: Internal Medicine

## 2015-10-12 DIAGNOSIS — K449 Diaphragmatic hernia without obstruction or gangrene: Secondary | ICD-10-CM

## 2015-10-14 ENCOUNTER — Other Ambulatory Visit: Payer: Self-pay | Admitting: Cardiovascular Disease

## 2015-10-25 ENCOUNTER — Ambulatory Visit
Admission: RE | Admit: 2015-10-25 | Discharge: 2015-10-25 | Disposition: A | Discharge status: Home / Self Care | Payer: Medicare Other | Source: Ambulatory Visit | Attending: Internal Medicine | Admitting: Internal Medicine

## 2015-10-25 DIAGNOSIS — K449 Diaphragmatic hernia without obstruction or gangrene: Secondary | ICD-10-CM

## 2015-10-28 ENCOUNTER — Encounter: Payer: Self-pay | Admitting: Hematology and Oncology

## 2015-10-28 DIAGNOSIS — D509 Iron deficiency anemia, unspecified: Secondary | ICD-10-CM | POA: Insufficient documentation

## 2015-11-07 ENCOUNTER — Telehealth: Payer: Self-pay | Admitting: *Deleted

## 2015-11-07 NOTE — Telephone Encounter (Signed)
Patient calling the office for samples of medication:   1.  What medication and dosage are you requesting samples for? Bystolic   2.  Are you currently out of this medication? Yes If we don't have any samples please send to [facility] [place].

## 2015-11-07 NOTE — Telephone Encounter (Signed)
Bystolic 5 mg placed at front desk for pick up. 

## 2015-11-08 ENCOUNTER — Ambulatory Visit
Admission: RE | Admit: 2015-11-08 | Discharge: 2015-11-08 | Disposition: A | Discharge status: Home / Self Care | Payer: Medicare Other | Source: Ambulatory Visit | Attending: Internal Medicine | Admitting: Internal Medicine

## 2015-11-08 DIAGNOSIS — K219 Gastro-esophageal reflux disease without esophagitis: Secondary | ICD-10-CM | POA: Diagnosis not present

## 2015-11-08 DIAGNOSIS — K449 Diaphragmatic hernia without obstruction or gangrene: Secondary | ICD-10-CM | POA: Diagnosis not present

## 2015-11-10 ENCOUNTER — Telehealth: Payer: Self-pay

## 2015-11-10 ENCOUNTER — Ambulatory Visit (INDEPENDENT_AMBULATORY_CARE_PROVIDER_SITE_OTHER): Payer: Medicare Other | Admitting: Cardiovascular Disease

## 2015-11-10 ENCOUNTER — Encounter: Payer: Self-pay | Admitting: Cardiovascular Disease

## 2015-11-10 VITALS — BP 100/60 | HR 74 | Ht 60.0 in | Wt 162.0 lb

## 2015-11-10 DIAGNOSIS — I5033 Acute on chronic diastolic (congestive) heart failure: Secondary | ICD-10-CM | POA: Diagnosis not present

## 2015-11-10 DIAGNOSIS — E1159 Type 2 diabetes mellitus with other circulatory complications: Secondary | ICD-10-CM

## 2015-11-10 DIAGNOSIS — E1165 Type 2 diabetes mellitus with hyperglycemia: Secondary | ICD-10-CM

## 2015-11-10 DIAGNOSIS — J449 Chronic obstructive pulmonary disease, unspecified: Secondary | ICD-10-CM

## 2015-11-10 DIAGNOSIS — F329 Major depressive disorder, single episode, unspecified: Secondary | ICD-10-CM

## 2015-11-10 DIAGNOSIS — R079 Chest pain, unspecified: Secondary | ICD-10-CM | POA: Diagnosis not present

## 2015-11-10 DIAGNOSIS — IMO0002 Reserved for concepts with insufficient information to code with codable children: Secondary | ICD-10-CM

## 2015-11-10 DIAGNOSIS — I509 Heart failure, unspecified: Secondary | ICD-10-CM

## 2015-11-10 DIAGNOSIS — F32A Depression, unspecified: Secondary | ICD-10-CM

## 2015-11-10 DIAGNOSIS — F418 Other specified anxiety disorders: Secondary | ICD-10-CM

## 2015-11-10 DIAGNOSIS — D649 Anemia, unspecified: Secondary | ICD-10-CM

## 2015-11-10 DIAGNOSIS — F419 Anxiety disorder, unspecified: Secondary | ICD-10-CM

## 2015-11-10 DIAGNOSIS — E871 Hypo-osmolality and hyponatremia: Secondary | ICD-10-CM

## 2015-11-10 NOTE — Assessment & Plan Note (Signed)
Basic metabolic panel drawn today Will confirm normal electrolytes

## 2015-11-10 NOTE — Patient Instructions (Addendum)
We will check blood work today  Please stay on bumex 2 mg twice a day Stop the hydrochlorothizide and the spironolactone for now  Please call us if you have new issues that need to be addressed before your next appt.  Your physician wants you to follow-up in: 1 month.

## 2015-11-10 NOTE — Telephone Encounter (Signed)
Pt c/o swelling: STAT is pt has developed SOB within 24 hours  1. How long have you been experiencing swelling? 1 week  2. Where is the swelling located? Both legs and ankles  3.  Are you currently taking a "fluid pill"?yes  4.  Are you currently SOB? Yes, on oxygen  5.  Have you traveled recently?no   Pt states she is not sure she can stand this much longer

## 2015-11-10 NOTE — Assessment & Plan Note (Signed)
She continues to have fluid retention, 2+ pitting edema Very anxious today, tearful.  Declined transport to hospital for care, for acute on chronic CHF Difficult to determine if leg edema entirely from heart failure as she is sleeping in recliner, often with her legs down.  Noncompliance with her Bumex. Has incontinence, inconvenience, likes to go out. She wants to stop all of her diuretics.  Recommended that she stop the Aldactone, chlorthalidone, stay on Bumex 2 mg twice a day  She is frustrated that she needs to stay on diuretics as she "does not drink anything". Likes to drink soda but reports that she does not drink very much We will follow up on her BMP drawn today in the office, we'll call her with results tomorrow Also recommended that we touch base next week with daily weights If she does not improve, may need evaluation in the emergency room

## 2015-11-10 NOTE — Assessment & Plan Note (Signed)
Tearful in the office today, Did not want evaluation in the emergency room. Initially did not want to take any diuretics. Encouraged her to take Bumex 2 mg twice a day for leg edema. Plan as above. In an effort to keep her on some of her diuretics, we will stop the spironolactone, and chlorthalidone

## 2015-11-10 NOTE — Telephone Encounter (Addendum)
Per Dr. Mariah Milling, if Ward Givens, NP does not have any openings, add pt onto today to see Dr. Mariah Milling @ 3:40.

## 2015-11-10 NOTE — Assessment & Plan Note (Signed)
Presenting on oxygen. Initial saturation 84% but she had nail polish in place. Repeat saturation 90

## 2015-11-10 NOTE — Assessment & Plan Note (Signed)
History of anemia, followed by hematology. Iron infusion in the past

## 2015-11-10 NOTE — Progress Notes (Signed)
Patient ID: Catherine Holmes, female    DOB: Jan 19, 1928, 82 y.o.   MRN: [id number]  HPI Comments: 82 year-old woman with a history of DM,  peripheral vascular disease, 60 to 70% carotid arterial disease, moderate arterial disease of the lower extremities, status post bilateral iliac stenting, moderate renal stenosis, hyperlipidemia, history of severe  hypertension with labile blood pressures, history of episodic shortness of breath and chest squeezing relieved with albuterol/nebs who presents for routine followup of her chronic diastolic CHF  She called today for urgent add-on for symptoms of leg swelling Weight is down 3 pounds from her prior clinic visit. She continues to have significant leg swelling. Daughter reports she is sleeping in her recliner, spending most of the day in her recliner. Starting to develop sacral skin breakdown. Unclear why she is spending most of her day in the chair. Reports she is not feeling well. Tearful in her office visit today, does not want to take diuretics. She has been taking spironolactone, also takes chlorthalidone 3 days per week, Bumex When asked how much of her Bumex she is taking, daughter reports not on a consistent basis, once or twice per week. Previously was post to be taking this twice per week 2 mg twice a day for leg swelling and weight gain, 1 mg twice a day for improved heart failure. She has not been doing this. She does not want to go to the hospital today for further treatment.  She feels her kidneys are failing. No recent blood work to confirm this. Reports that she is scheduled to see Dr. Cherylann Ratel Hematocrit one month ago was improved 32.7   Other past medical history She has a sulfa allergy to loop diuretics.  unable to take Lasix  History of falls.   left carotid arterial stenosis estimated at 60-79% disease. She has had several admissions for diastolic CHF, typically from dietary indiscretion and noncompliance with diuretics She was  admitted on 11/02/2013 , discharged on 11/16/2013  admitted on 01/24/2014, discharge from 01/27/2014 for acute respiratory failure secondary to COPD History of anemia followed by hematology  Other past medical history Over the summer 2016, she had severe leg edema extending up her thighs, shortness of breath, abdominal swelling We recommended Bumex 2 mg twice a day Persistent edemasince that time  hospitalized  September 2016 at an outside facility for pneumonia and sepsis.  she underwent the chest compressions CPR for what that looks like a vasovagal episode and had a sternal fracture. We presented to the hospital early October 2016 with hypoxia, shortness of breath , inadequate pain control /Chest pain. sodium level of 129, mild anemia with hemoglobin level of 10.5.    CT scan of the chest showed no pulmonary embolism. However, is based consolidation in both lung bases, more on the right than on the left with small right pleural effusion was noted, as well as stable collapse of T8 and T12 vertebral bodies  she was treated for pneumonia , discharged to [facility] rehabilitation   During her time in [place], she had dramatic weight gain, lower extremity edema  We received a call us morning that she was going to acute care.  We have recommended that she come to the office for urgent evaluation  On evaluation today, she has 2+ pitting edema to her thighs.  She  Reports mildshortness of breath,  abdominal bloating  Significant sores and ecchymoses of her legs.  Compression wraps in place  She has been taking Bumex sparingly  she feels that she needs a iron infusion.  Hemoglobin down to 8.8.   Previous hospital  admission for acute on chronic diastolic CHF. Improvement of her symptoms with aggressive diuresis. Readmitted this past week to the hospital with hypotension. She has persistent anemia  had iron infusions in the past.   Followed by Dr. Cherylann Ratel for her kidney issues  She stopped her  amiodarone out of concern for possible pulmonary complications. Denies having any palpitations She does report having rare episodes of near syncope wall in a sitting position. Comes across her like a wave it resolves relatively quickly  She was in the hospital 05/05/2014 with hypertensive urgency, hyponatremia. Blood pressure is 201/98 and she was put back on her outpatient regiment with improvement of her symptoms. She suffered a fractured hip on 05/07/2014 and we were consulted for preop evaluation. Found to have elevated at her pressures at 65. She was discharged on 05/17/2014 EKGs from her hospital course showed she had atrial fibrillation on 05/09/2014  Previous Echocardiogram in the hospital showed normal ejection fraction, moderate to severe, hypertension, moderately dilated left atrium  Has a history of large hiatal hernia She is not able to tolerate statins. She does handle zetia without significant side effects. previously encouraged to take red yeast rice.   Previous weight October 2014 was 148 pounds.  weight at the end of December 2014 was 160 pounds, she had increasing shortness of breath, had not been taking Bumex    in the hospital at [facility] in 2012 for general malaise, severe hypertension, shortness of breath, chest pain. She had a stress test, lexiscan that showed no significant ischemia. Significant shortness of breath with lexiscan.   hospital admission on 01/08/2012 for shortness of breath and syncope. She was found to have significant anemia. Treated with Lasix for diastolic CHF.  Underlying severe left carotid arterial disease estimated at 70%.   Prior followup with Dr. Welton Flakes in pulmonary with numerous CT scans, MRIs, PFTs, sleep study. She was told that she had hypoxemia overnight and was started on nighttime oxygen.    hospitalization 05/27/2013 for acute on chronic diastolic CHF. She was treated with IV Bumex twice a day with improvement of her symptoms. Getting factor was  her anemia with hematocrit of 25.    Outpatient Encounter Prescriptions as of 11/10/2015  Medication Sig  . albuterol (PROAIR HFA) 108 (90 BASE) MCG/ACT inhaler Inhale 2 puffs into the lungs every 6 (six) hours as needed.  Marland Kitchen albuterol (PROVENTIL) (2.5 MG/3ML) 0.083% nebulizer solution Take 2.5 mg by nebulization every 6 (six) hours as needed for wheezing or shortness of breath.   Marland Kitchen arformoterol (BROVANA) 15 MCG/2ML NEBU Take 2 mLs (15 mcg total) by nebulization 2 (two) times daily.  Marland Kitchen aspirin EC 81 MG tablet Take 81 mg by mouth daily.  . beta carotene w/minerals (OCUVITE) tablet Take 1 tablet by mouth 2 (two) times daily.  . bumetanide (BUMEX) 2 MG tablet Take 1 tablet (2 mg total) by mouth 2 (two) times daily as needed. Take 1 tab daily  . calcium carbonate (OS-CAL) 600 MG TABS tablet Take 600 mg by mouth daily with breakfast.  . chlorthalidone (HYGROTON) 25 MG tablet Take 1 tablet (25 mg total) by mouth daily as needed.  . Cholecalciferol (VITAMIN D-3 PO) Take 1,000 Units by mouth.  . citalopram (CELEXA) 20 MG tablet Take 20 mg by mouth daily.  . diphenhydramine-acetaminophen (TYLENOL PM) 25-500 MG TABS Take 1 tablet by mouth at bedtime as needed.  Marland Kitchen  doxazosin (CARDURA) 4 MG tablet Take 1 tablet (4 mg total) by mouth daily.  Marland Kitchen ezetimibe (ZETIA) 10 MG tablet Take 5 mg by mouth daily.  . fluticasone (FLONASE) 50 MCG/ACT nasal spray Place 1 spray into both nostrils daily.  Marland Kitchen gabapentin (NEURONTIN) 100 MG capsule Take 100 mg by mouth 2 (two) times daily.   . hydrALAZINE (APRESOLINE) 50 MG tablet Take 50 mg by mouth as needed.   Marland Kitchen HYDROcodone-acetaminophen (NORCO/VICODIN) 5-325 MG tablet Take 1-2 tablets by mouth 2 (two) times daily as needed for moderate pain.  . mometasone-formoterol (DULERA) 100-5 MCG/ACT AERO Inhale 2 puffs into the lungs 2 (two) times daily.   . Multiple Vitamin (MULTIVITAMIN) tablet Take 1 tablet by mouth daily.    . mupirocin ointment (BACTROBAN) 2 % Apply topically.  .  nebivolol (BYSTOLIC) 5 MG tablet Take 1 tablet (5 mg total) by mouth daily.  . nitroGLYCERIN (NITROSTAT) 0.4 MG SL tablet PLACE 1 TABLET (0.4 MG TOTAL) UNDER THE TONGUE EVERY 5 (FIVE) MINUTES AS NEEDED FOR CHEST PAIN.  Marland Kitchen potassium chloride (K-DUR,KLOR-CON) 10 MEQ tablet Take 0.5 tablets (5 mEq total) by mouth 2 (two) times daily as needed. Patient takes this when she takes Bumex.  Marland Kitchen spironolactone (ALDACTONE) 50 MG tablet daily as needed. Takes 1 tablet Mon. Wednesday and Friday.  . tiotropium (SPIRIVA) 18 MCG inhalation capsule Place 1 capsule (18 mcg total) into inhaler and inhale daily.   Facility-Administered Encounter Medications as of 11/10/2015  Medication  . 0.9 %  sodium chloride infusion  . alteplase (CATHFLO ACTIVASE) injection 2 mg  . ferumoxytol (FERAHEME) 510 mg in sodium chloride 0.9 % 100 mL IVPB  . heparin lock flush 100 unit/mL  . heparin lock flush 100 unit/mL  . sodium chloride 0.9 % injection 10 mL  . sodium chloride 0.9 % injection 3 mL   Social hx Social History   Social History  . Marital Status: Divorced    Spouse Name: N/A  . Number of Children: 2  . Years of Education: N/A   Occupational History  . Retired     former IT sales professional   Social History Main Topics  . Smoking status: Former Smoker -- 0.50 packs/day for 30 years    Types: Cigarettes    Quit date: 10/22/1994  . Smokeless tobacco: Never Used  . Alcohol Use: Yes  . Drug Use: No  . Sexual Activity: Not on file   Other Topics Concern  . Not on file   Social History Narrative    Review of Systems  Respiratory: Positive for shortness of breath.   Cardiovascular: Positive for leg swelling.  Gastrointestinal: Negative.   Musculoskeletal: Positive for arthralgias and gait problem.  Neurological: Positive for weakness.       Tearful  Hematological: Negative.   Psychiatric/Behavioral: The patient is nervous/anxious.   All other systems reviewed and are negative.   BP 100/60 mmHg   Pulse 74  Ht 5' (1.524 m)  Wt 162 lb (73.483 kg)  BMI 31.64 kg/m2  SpO2 81%  Physical Exam  Constitutional: She is oriented to person, place, and time. She appears well-developed and well-nourished.   Presenting in wheelchair  HENT:  Head: Normocephalic.  Nose: Nose normal.  Mouth/Throat: Oropharynx is clear and moist.  Eyes: Conjunctivae are normal. Pupils are equal, round, and reactive to light.  Neck: Normal range of motion. Neck supple. No JVD present.  Cardiovascular: Normal rate, regular rhythm, S1 normal, S2 normal, normal heart sounds and intact distal pulses.  Exam reveals no gallop and no friction rub.   No murmur heard. 2+ pitting edema to below the knees,ecchymosis and sores on her legs   Pulmonary/Chest: Effort normal and breath sounds normal. No respiratory distress. She has no wheezes. She has no rales. She exhibits no tenderness.  Abdominal: Soft. Bowel sounds are normal. She exhibits no distension. There is no tenderness.  Musculoskeletal: Normal range of motion. She exhibits no edema or tenderness.  Lymphadenopathy:    She has no cervical adenopathy.  Neurological: She is alert and oriented to person, place, and time. Coordination normal.  Skin: Skin is warm and dry. No rash noted. No erythema.  Psychiatric: She has a normal mood and affect. Her behavior is normal. Judgment and thought content normal.    Assessment and Plan  Nursing note and vitals reviewed.

## 2015-11-10 NOTE — Assessment & Plan Note (Signed)
Managed by primary care. In general does not follow a strict diet Will eat out frequently

## 2015-11-11 LAB — BASIC METABOLIC PANEL
BUN / CREAT RATIO: 20 (ref 11–26)
BUN: 12 mg/dL (ref 8–27)
CHLORIDE: 87 mmol/L — AB (ref 96–106)
CO2: 30 mmol/L — AB (ref 18–29)
Calcium: 9.1 mg/dL (ref 8.7–10.3)
Creatinine, Ser: 0.61 mg/dL (ref 0.57–1.00)
GFR calc Af Amer: 96 mL/min/{1.73_m2} (ref >59)
GFR calc non Af Amer: 83 mL/min/{1.73_m2} (ref >59)
GLUCOSE: 135 mg/dL — AB (ref 65–99)
POTASSIUM: 4.2 mmol/L (ref 3.5–5.2)
SODIUM: 133 mmol/L — AB (ref 134–144)

## 2015-11-13 ENCOUNTER — Emergency Department: Payer: Medicare Other

## 2015-11-13 ENCOUNTER — Inpatient Hospital Stay
Admission: EM | Admit: 2015-11-13 | Discharge: 2015-11-21 | DRG: 189 | Disposition: A | Discharge status: Skilled Nursing Facility | Payer: Medicare Other | Attending: Internal Medicine | Admitting: Internal Medicine

## 2015-11-13 ENCOUNTER — Encounter: Payer: Self-pay | Admitting: Emergency Medicine

## 2015-11-13 DIAGNOSIS — R0902 Hypoxemia: Secondary | ICD-10-CM

## 2015-11-13 DIAGNOSIS — Z9842 Cataract extraction status, left eye: Secondary | ICD-10-CM

## 2015-11-13 DIAGNOSIS — Z66 Do not resuscitate: Secondary | ICD-10-CM | POA: Diagnosis present

## 2015-11-13 DIAGNOSIS — I10 Essential (primary) hypertension: Secondary | ICD-10-CM | POA: Diagnosis not present

## 2015-11-13 DIAGNOSIS — Z9981 Dependence on supplemental oxygen: Secondary | ICD-10-CM | POA: Diagnosis not present

## 2015-11-13 DIAGNOSIS — K449 Diaphragmatic hernia without obstruction or gangrene: Secondary | ICD-10-CM | POA: Diagnosis present

## 2015-11-13 DIAGNOSIS — E876 Hypokalemia: Secondary | ICD-10-CM | POA: Diagnosis present

## 2015-11-13 DIAGNOSIS — E785 Hyperlipidemia, unspecified: Secondary | ICD-10-CM | POA: Diagnosis present

## 2015-11-13 DIAGNOSIS — Z8249 Family history of ischemic heart disease and other diseases of the circulatory system: Secondary | ICD-10-CM | POA: Diagnosis not present

## 2015-11-13 DIAGNOSIS — E874 Mixed disorder of acid-base balance: Secondary | ICD-10-CM | POA: Diagnosis present

## 2015-11-13 DIAGNOSIS — Z23 Encounter for immunization: Secondary | ICD-10-CM

## 2015-11-13 DIAGNOSIS — M810 Age-related osteoporosis without current pathological fracture: Secondary | ICD-10-CM | POA: Diagnosis present

## 2015-11-13 DIAGNOSIS — F419 Anxiety disorder, unspecified: Secondary | ICD-10-CM | POA: Diagnosis present

## 2015-11-13 DIAGNOSIS — I5032 Chronic diastolic (congestive) heart failure: Secondary | ICD-10-CM | POA: Diagnosis present

## 2015-11-13 DIAGNOSIS — E871 Hypo-osmolality and hyponatremia: Secondary | ICD-10-CM | POA: Diagnosis present

## 2015-11-13 DIAGNOSIS — J9690 Respiratory failure, unspecified, unspecified whether with hypoxia or hypercapnia: Secondary | ICD-10-CM

## 2015-11-13 DIAGNOSIS — Z79899 Other long term (current) drug therapy: Secondary | ICD-10-CM | POA: Diagnosis not present

## 2015-11-13 DIAGNOSIS — Z9841 Cataract extraction status, right eye: Secondary | ICD-10-CM | POA: Diagnosis not present

## 2015-11-13 DIAGNOSIS — Z9889 Other specified postprocedural states: Secondary | ICD-10-CM

## 2015-11-13 DIAGNOSIS — Z8673 Personal history of transient ischemic attack (TIA), and cerebral infarction without residual deficits: Secondary | ICD-10-CM | POA: Diagnosis not present

## 2015-11-13 DIAGNOSIS — Z7982 Long term (current) use of aspirin: Secondary | ICD-10-CM | POA: Diagnosis not present

## 2015-11-13 DIAGNOSIS — E119 Type 2 diabetes mellitus without complications: Secondary | ICD-10-CM | POA: Diagnosis present

## 2015-11-13 DIAGNOSIS — Z8701 Personal history of pneumonia (recurrent): Secondary | ICD-10-CM | POA: Diagnosis not present

## 2015-11-13 DIAGNOSIS — Z90710 Acquired absence of both cervix and uterus: Secondary | ICD-10-CM

## 2015-11-13 DIAGNOSIS — I110 Hypertensive heart disease with heart failure: Secondary | ICD-10-CM | POA: Diagnosis present

## 2015-11-13 DIAGNOSIS — J9621 Acute and chronic respiratory failure with hypoxia: Principal | ICD-10-CM | POA: Diagnosis present

## 2015-11-13 DIAGNOSIS — I5033 Acute on chronic diastolic (congestive) heart failure: Secondary | ICD-10-CM | POA: Diagnosis not present

## 2015-11-13 DIAGNOSIS — J441 Chronic obstructive pulmonary disease with (acute) exacerbation: Secondary | ICD-10-CM | POA: Diagnosis present

## 2015-11-13 DIAGNOSIS — J9622 Acute and chronic respiratory failure with hypercapnia: Secondary | ICD-10-CM | POA: Diagnosis not present

## 2015-11-13 DIAGNOSIS — Z9119 Patient's noncompliance with other medical treatment and regimen: Secondary | ICD-10-CM | POA: Diagnosis not present

## 2015-11-13 DIAGNOSIS — Z888 Allergy status to other drugs, medicaments and biological substances status: Secondary | ICD-10-CM | POA: Diagnosis not present

## 2015-11-13 DIAGNOSIS — Z87891 Personal history of nicotine dependence: Secondary | ICD-10-CM | POA: Diagnosis not present

## 2015-11-13 DIAGNOSIS — Z9049 Acquired absence of other specified parts of digestive tract: Secondary | ICD-10-CM

## 2015-11-13 DIAGNOSIS — R778 Other specified abnormalities of plasma proteins: Secondary | ICD-10-CM

## 2015-11-13 DIAGNOSIS — L8990 Pressure ulcer of unspecified site, unspecified stage: Secondary | ICD-10-CM | POA: Diagnosis present

## 2015-11-13 DIAGNOSIS — K219 Gastro-esophageal reflux disease without esophagitis: Secondary | ICD-10-CM | POA: Diagnosis present

## 2015-11-13 DIAGNOSIS — F411 Generalized anxiety disorder: Secondary | ICD-10-CM | POA: Diagnosis not present

## 2015-11-13 DIAGNOSIS — J9620 Acute and chronic respiratory failure, unspecified whether with hypoxia or hypercapnia: Secondary | ICD-10-CM | POA: Diagnosis present

## 2015-11-13 DIAGNOSIS — R7989 Other specified abnormal findings of blood chemistry: Secondary | ICD-10-CM

## 2015-11-13 LAB — COMPREHENSIVE METABOLIC PANEL
ALBUMIN: 3.7 g/dL (ref 3.5–5.0)
ALK PHOS: 99 U/L (ref 38–126)
ALT: 15 U/L (ref 14–54)
AST: 20 U/L (ref 15–41)
Anion gap: 13 (ref 5–15)
BILIRUBIN TOTAL: 0.9 mg/dL (ref 0.3–1.2)
BUN: 15 mg/dL (ref 6–20)
CALCIUM: 8.4 mg/dL — AB (ref 8.9–10.3)
CO2: 37 mmol/L — ABNORMAL HIGH (ref 22–32)
Chloride: 86 mmol/L — ABNORMAL LOW (ref 101–111)
Creatinine, Ser: 0.57 mg/dL (ref 0.44–1.00)
GFR calc Af Amer: >60 mL/min (ref >60)
GFR calc non Af Amer: >60 mL/min (ref >60)
GLUCOSE: 182 mg/dL — AB (ref 65–99)
Potassium: 3.5 mmol/L (ref 3.5–5.1)
Sodium: 136 mmol/L (ref 135–145)
TOTAL PROTEIN: 7.1 g/dL (ref 6.5–8.1)

## 2015-11-13 LAB — CBC WITH DIFFERENTIAL/PLATELET
Basophils Absolute: 0.5 10*3/uL — ABNORMAL HIGH (ref 0–0.1)
Basophils Relative: 3 %
EOS ABS: 0.1 10*3/uL (ref 0–0.7)
EOS PCT: 1 %
HCT: 31.9 % — ABNORMAL LOW (ref 35.0–47.0)
Hemoglobin: 10.3 g/dL — ABNORMAL LOW (ref 12.0–16.0)
LYMPHS ABS: 0.7 10*3/uL — AB (ref 1.0–3.6)
Lymphocytes Relative: 4 %
MCH: 26.9 pg (ref 26.0–34.0)
MCHC: 32.2 g/dL (ref 32.0–36.0)
MCV: 83.6 fL (ref 80.0–100.0)
MONOS PCT: 4 %
Monocytes Absolute: 0.7 10*3/uL (ref 0.2–0.9)
Neutro Abs: 15.9 10*3/uL — ABNORMAL HIGH (ref 1.4–6.5)
Neutrophils Relative %: 88 %
PLATELETS: 377 10*3/uL (ref 150–440)
RBC: 3.82 MIL/uL (ref 3.80–5.20)
RDW: 18.4 % — ABNORMAL HIGH (ref 11.5–14.5)
WBC: 17.9 10*3/uL — ABNORMAL HIGH (ref 3.6–11.0)

## 2015-11-13 LAB — TROPONIN I: Troponin I: 0.05 ng/mL — ABNORMAL HIGH (ref <0.031)

## 2015-11-13 LAB — BRAIN NATRIURETIC PEPTIDE: B Natriuretic Peptide: 335 pg/mL — ABNORMAL HIGH (ref 0.0–100.0)

## 2015-11-13 MED ORDER — CALCIUM CARBONATE ANTACID 500 MG PO CHEW
1500.0000 mg | CHEWABLE_TABLET | Freq: Every day | ORAL | Status: DC
  Administered 2015-11-14 – 2015-11-20 (×4): 1500 mg via ORAL
  Filled 2015-11-13 (×6): qty 3

## 2015-11-13 MED ORDER — DOCUSATE SODIUM 100 MG PO CAPS
100.0000 mg | ORAL_CAPSULE | Freq: Two times a day (BID) | ORAL | Status: DC
  Administered 2015-11-15 – 2015-11-20 (×9): 100 mg via ORAL
  Filled 2015-11-13 (×14): qty 1

## 2015-11-13 MED ORDER — NEBIVOLOL HCL 5 MG PO TABS
5.0000 mg | ORAL_TABLET | Freq: Every day | ORAL | Status: DC
  Administered 2015-11-14 – 2015-11-21 (×8): 5 mg via ORAL
  Filled 2015-11-13 (×10): qty 1

## 2015-11-13 MED ORDER — DOXAZOSIN MESYLATE 4 MG PO TABS
4.0000 mg | ORAL_TABLET | Freq: Every day | ORAL | Status: DC
  Administered 2015-11-14 – 2015-11-20 (×7): 4 mg via ORAL
  Filled 2015-11-13 (×8): qty 1

## 2015-11-13 MED ORDER — AZTREONAM 2 G IJ SOLR
2.0000 g | Freq: Three times a day (TID) | INTRAMUSCULAR | Status: DC
  Administered 2015-11-14: 2 g via INTRAVENOUS
  Filled 2015-11-13 (×4): qty 2

## 2015-11-13 MED ORDER — HEPARIN SODIUM (PORCINE) 5000 UNIT/ML IJ SOLN
5000.0000 [IU] | Freq: Three times a day (TID) | INTRAMUSCULAR | Status: DC
  Administered 2015-11-14 – 2015-11-21 (×21): 5000 [IU] via SUBCUTANEOUS
  Filled 2015-11-13 (×20): qty 1

## 2015-11-13 MED ORDER — SPIRONOLACTONE 25 MG PO TABS
25.0000 mg | ORAL_TABLET | ORAL | Status: DC
  Administered 2015-11-14 – 2015-11-21 (×4): 25 mg via ORAL
  Filled 2015-11-13 (×4): qty 1

## 2015-11-13 MED ORDER — IPRATROPIUM-ALBUTEROL 0.5-2.5 (3) MG/3ML IN SOLN
3.0000 mL | Freq: Once | RESPIRATORY_TRACT | Status: DC
  Filled 2015-11-13: qty 3

## 2015-11-13 MED ORDER — METHYLPREDNISOLONE SODIUM SUCC 125 MG IJ SOLR
60.0000 mg | INTRAMUSCULAR | Status: DC
  Administered 2015-11-14: 60 mg via INTRAVENOUS
  Filled 2015-11-13: qty 2

## 2015-11-13 MED ORDER — CITALOPRAM HYDROBROMIDE 20 MG PO TABS
20.0000 mg | ORAL_TABLET | Freq: Every day | ORAL | Status: DC
  Administered 2015-11-14 – 2015-11-21 (×8): 20 mg via ORAL
  Filled 2015-11-13 (×8): qty 1

## 2015-11-13 MED ORDER — PANTOPRAZOLE SODIUM 40 MG PO TBEC
40.0000 mg | DELAYED_RELEASE_TABLET | Freq: Every day | ORAL | Status: DC
  Administered 2015-11-14: 40 mg via ORAL
  Filled 2015-11-13: qty 1

## 2015-11-13 MED ORDER — BUMETANIDE 0.25 MG/ML IJ SOLN: 2.0000 mg | Freq: Once | INTRAMUSCULAR | Status: DC

## 2015-11-13 MED ORDER — BUMETANIDE 0.25 MG/ML IJ SOLN
1.0000 mg | Freq: Once | INTRAMUSCULAR | Status: DC
Start: 2015-11-13 — End: 2015-11-21
  Filled 2015-11-13 (×2): qty 4

## 2015-11-13 MED ORDER — SODIUM CHLORIDE 0.9 % IV BOLUS (SEPSIS)
250.0000 mL | Freq: Once | INTRAVENOUS | Status: AC
  Administered 2015-11-13: 250 mL via INTRAVENOUS

## 2015-11-13 MED ORDER — IPRATROPIUM-ALBUTEROL 0.5-2.5 (3) MG/3ML IN SOLN
3.0000 mL | Freq: Four times a day (QID) | RESPIRATORY_TRACT | Status: AC
  Administered 2015-11-14 – 2015-11-16 (×11): 3 mL via RESPIRATORY_TRACT
  Filled 2015-11-13 (×11): qty 3

## 2015-11-13 MED ORDER — TIOTROPIUM BROMIDE MONOHYDRATE 1.25 MCG/ACT IN AERS: 1.0000 | INHALATION_SPRAY | Freq: Every day | RESPIRATORY_TRACT | Status: DC

## 2015-11-13 MED ORDER — METOCLOPRAMIDE HCL 10 MG PO TABS
5.0000 mg | ORAL_TABLET | Freq: Two times a day (BID) | ORAL | Status: DC
  Administered 2015-11-14 – 2015-11-21 (×14): 5 mg via ORAL
  Filled 2015-11-13 (×15): qty 1

## 2015-11-13 MED ORDER — ONDANSETRON HCL 4 MG PO TABS: 4.0000 mg | ORAL_TABLET | Freq: Four times a day (QID) | ORAL | Status: DC | PRN

## 2015-11-13 MED ORDER — OCUVITE PO TABS
1.0000 | ORAL_TABLET | Freq: Two times a day (BID) | ORAL | Status: DC
  Administered 2015-11-14 – 2015-11-17 (×5): 1 via ORAL
  Filled 2015-11-13 (×18): qty 1

## 2015-11-13 MED ORDER — SODIUM CHLORIDE 0.9 % IV SOLN
1.0000 g | Freq: Three times a day (TID) | INTRAVENOUS | Status: DC
  Administered 2015-11-13 – 2015-11-14 (×2): 1 g via INTRAVENOUS
  Filled 2015-11-13 (×5): qty 1

## 2015-11-13 MED ORDER — ONDANSETRON HCL 4 MG/2ML IJ SOLN
4.0000 mg | Freq: Four times a day (QID) | INTRAMUSCULAR | Status: DC | PRN
  Administered 2015-11-17 – 2015-11-21 (×3): 4 mg via INTRAVENOUS
  Filled 2015-11-13 (×3): qty 2

## 2015-11-13 MED ORDER — TRAZODONE HCL 50 MG PO TABS
25.0000 mg | ORAL_TABLET | Freq: Every evening | ORAL | Status: DC | PRN
  Administered 2015-11-14 – 2015-11-20 (×5): 25 mg via ORAL
  Filled 2015-11-13 (×5): qty 1

## 2015-11-13 MED ORDER — ADULT MULTIVITAMIN W/MINERALS CH
1.0000 | ORAL_TABLET | ORAL | Status: DC
Start: 2015-11-14 — End: 2015-11-21
  Administered 2015-11-16 – 2015-11-18 (×2): 1 via ORAL
  Filled 2015-11-13 (×3): qty 1

## 2015-11-13 MED ORDER — FLUTICASONE PROPIONATE 50 MCG/ACT NA SUSP
1.0000 | Freq: Every day | NASAL | Status: DC
  Administered 2015-11-16: 2 via NASAL
  Administered 2015-11-19: 22:00:00 1 via NASAL
  Administered 2015-11-20: 2 via NASAL
  Filled 2015-11-13: qty 16

## 2015-11-13 MED ORDER — ASPIRIN EC 81 MG PO TBEC
81.0000 mg | DELAYED_RELEASE_TABLET | Freq: Every day | ORAL | Status: DC
  Administered 2015-11-14 – 2015-11-21 (×8): 81 mg via ORAL
  Filled 2015-11-13 (×9): qty 1

## 2015-11-13 MED ORDER — BUMETANIDE 0.25 MG/ML IJ SOLN
1.0000 mg | Freq: Two times a day (BID) | INTRAMUSCULAR | Status: DC
  Administered 2015-11-14 – 2015-11-19 (×11): 1 mg via INTRAVENOUS
  Filled 2015-11-13 (×14): qty 4

## 2015-11-13 MED ORDER — GABAPENTIN 100 MG PO CAPS
100.0000 mg | ORAL_CAPSULE | Freq: Two times a day (BID) | ORAL | Status: DC
  Administered 2015-11-14 – 2015-11-21 (×15): 100 mg via ORAL
  Filled 2015-11-13 (×15): qty 1

## 2015-11-13 MED ORDER — TIOTROPIUM BROMIDE MONOHYDRATE 18 MCG IN CAPS
18.0000 ug | ORAL_CAPSULE | Freq: Every day | RESPIRATORY_TRACT | Status: DC
  Administered 2015-11-14 – 2015-11-20 (×6): 18 ug via RESPIRATORY_TRACT
  Filled 2015-11-13 (×2): qty 5

## 2015-11-13 MED ORDER — ADENOSINE 12 MG/4ML IV SOLN
INTRAVENOUS | Status: AC
  Filled 2015-11-13: qty 4

## 2015-11-13 MED ORDER — CHLORHEXIDINE GLUCONATE 0.12 % MT SOLN
10.0000 mL | Freq: Two times a day (BID) | OROMUCOSAL | Status: DC
  Administered 2015-11-14 (×2): 10 mL via OROMUCOSAL
  Administered 2015-11-14: 11:00:00 via OROMUCOSAL
  Administered 2015-11-15 – 2015-11-21 (×11): 10 mL via OROMUCOSAL
  Filled 2015-11-13 (×11): qty 15

## 2015-11-13 MED ORDER — EZETIMIBE 10 MG PO TABS
10.0000 mg | ORAL_TABLET | Freq: Every day | ORAL | Status: DC
  Administered 2015-11-14 – 2015-11-20 (×7): 10 mg via ORAL
  Filled 2015-11-13 (×7): qty 1

## 2015-11-13 MED ORDER — ALUM & MAG HYDROXIDE-SIMETH 200-200-20 MG/5ML PO SUSP: 30.0000 mL | Freq: Four times a day (QID) | ORAL | Status: DC | PRN

## 2015-11-13 MED ORDER — ADENOSINE 12 MG/4ML IV SOLN
INTRAVENOUS | Status: AC
  Filled 2015-11-13: qty 8

## 2015-11-13 MED ORDER — BISACODYL 5 MG PO TBEC: 5.0000 mg | DELAYED_RELEASE_TABLET | Freq: Every day | ORAL | Status: DC | PRN

## 2015-11-13 MED ORDER — CHLORTHALIDONE 25 MG PO TABS
25.0000 mg | ORAL_TABLET | Freq: Every day | ORAL | Status: DC
  Administered 2015-11-15 – 2015-11-21 (×7): 25 mg via ORAL
  Filled 2015-11-13 (×8): qty 1

## 2015-11-13 MED ORDER — ARFORMOTEROL TARTRATE 15 MCG/2ML IN NEBU
15.0000 ug | INHALATION_SOLUTION | Freq: Two times a day (BID) | RESPIRATORY_TRACT | Status: DC
  Administered 2015-11-14 – 2015-11-21 (×15): 15 ug via RESPIRATORY_TRACT
  Filled 2015-11-13 (×17): qty 2

## 2015-11-13 NOTE — ED Provider Notes (Addendum)
[facility] Emergency Department Provider Note  ____________________________________________  Time seen: Approximately 7:14 PM  I have reviewed the triage vital signs and the nursing notes.   HISTORY  Chief Complaint Shortness of Breath   HPI Malayzia Laforte Coppinger is a 82 y.o. female patient comes from home reportedly began began to get cold and shaky and short of breath about an hour prior to calling EMS EMS arrived at her house and found her shivering with her O2 sat in the 70s. Patient usually is on oxygen at 2 and half liters a minute. Patient complains of increasing cough productive of yellow phlegm and shortness of breath and feeling cold. O2 sats in the ER were in the 70s as well. Patient placed on BiPAP   Past Medical History  Diagnosis Date  . Essential hypertension   . Diabetes mellitus type II, controlled (HCC)   . COPD (chronic obstructive pulmonary disease) (HCC)   . Osteoporosis   . Carotid arterial disease (HCC)     a. 2012 Carotid dopplers: 40-59% R ICA, 60-79% L ICA  . Tic douloureux   . Arthralgia   . Sjoegren syndrome (HCC)   . TIA (transient ischemic attack)   . Emphysema   . MVP (mitral valve prolapse)   . Chronic diastolic CHF (congestive heart failure) (HCC)     a. 08/2011 Echo: EF 65-70%, mild LVH, mod dil LA, mildly dil RA, mild MR, mild-mod AoV Sclerosis w/o stenosis, mod-sev elev PA pressures, mild TR.  Marland Kitchen Acid reflux   . Diverticulitis   . Anemia   . History of pneumonia   . Syncope and collapse   . Anemia   . Hip fracture, left (HCC)   . Hyperlipidemia   . Hiatal hernia   . Chest pain     a. 08/2011 MV: EF 74%, no ischemia.  . Noncompliance     a. h/o not taking bumex as Rx.  Marland Kitchen PAF (paroxysmal atrial fibrillation) (HCC)     a. CHA2DS2VASc = 8 ->No OAC 2/2 falls.    Patient Active Problem List   Diagnosis Date Noted  . COPD exacerbation (HCC) 11/13/2015  . Acute on chronic respiratory failure (HCC) 11/13/2015  . Anxiety  and depression 11/10/2015  . Iron deficiency anemia 10/28/2015  . Musculoskeletal chest pain 07/29/2015  . Sternal fracture 07/29/2015  . Pneumonia 07/29/2015  . Acute on chronic respiratory failure with hypoxia and hypercapnia (HCC) 07/28/2015  . Hyponatremia 04/11/2015  . Syncope 04/10/2015  . Absolute anemia   . Acute on chronic diastolic CHF (congestive heart failure) (HCC)   . CHF (congestive heart failure) (HCC) 03/31/2015  . Essential hypertension   . Diabetes mellitus type II, controlled (HCC)   . Hyposmolality and/or hyponatremia 12/04/2013  . Anemia 12/04/2013  . Chronic diastolic CHF (congestive heart failure) (HCC) 04/05/2013  . Positive fecal occult blood test 04/05/2013  . Fall 11/20/2012  . Chest tightness 07/13/2011  . Diabetes type 2, uncontrolled (HCC) 11/29/2009  . Hyperlipidemia 11/29/2009  . TIC DOULOUREUX 11/29/2009  . HYPERTENSION, BENIGN 11/29/2009  . Mitral valve disorder 11/29/2009  . Congestive heart failure (HCC) 11/29/2009  . Carotid arterial disease (HCC) 11/29/2009  . TIA 11/29/2009  . PAD (peripheral artery disease) (HCC) 11/29/2009  . EMPHYSEMA 11/29/2009  . COPD (chronic obstructive pulmonary disease) (HCC) 11/29/2009  . GERD 11/29/2009  . SJOGREN'S SYNDROME 11/29/2009  . ARTHRALGIA 11/29/2009  . OSTEOPOROSIS 11/29/2009  . DYSPNEA 11/29/2009    Past Surgical History  Procedure Laterality Date  .  Cholecystectomy  1965  . Abdominal hysterectomy  1964  . Cataract extraction Bilateral   . Hip fracture surgery  2014    left hip    Current Outpatient Rx  Name  Route  Sig  Dispense  Refill  . albuterol (PROAIR HFA) 108 (90 BASE) MCG/ACT inhaler   Inhalation   Inhale 2 puffs into the lungs every 6 (six) hours as needed.   3 Inhaler   0     For further refills please refill with your primar ...   . albuterol (PROVENTIL) (2.5 MG/3ML) 0.083% nebulizer solution   Nebulization   Take 2.5 mg by nebulization every 6 (six) hours as needed  for wheezing or shortness of breath.          Marland Kitchen aspirin EC 81 MG tablet   Oral   Take 81 mg by mouth daily.         . beta carotene w/minerals (OCUVITE) tablet   Oral   Take 1 tablet by mouth 2 (two) times daily.         . bumetanide (BUMEX) 2 MG tablet   Oral   Take 1 tablet (2 mg total) by mouth 2 (two) times daily as needed. Take 1 tab daily Patient taking differently: Take 2 mg by mouth 2 (two) times daily. Take 1 tab daily   60 tablet   11   . calcium carbonate (OS-CAL) 600 MG TABS tablet   Oral   Take 600 mg by mouth daily with breakfast.         . chlorhexidine (PERIDEX) 0.12 % solution   Mouth Rinse   by Mouth Rinse route 2 (two) times daily. Rinse and spit 2 times a day for 30 seconds.      12   . citalopram (CELEXA) 20 MG tablet   Oral   Take 20 mg by mouth daily with lunch.          . diphenhydramine-acetaminophen (TYLENOL PM) 25-500 MG TABS   Oral   Take 1 tablet by mouth at bedtime as needed (for sleep.).          Marland Kitchen doxazosin (CARDURA) 4 MG tablet   Oral   Take 4 mg by mouth at bedtime.          Marland Kitchen esomeprazole (NEXIUM) 40 MG capsule   Oral   Take 40 mg by mouth daily before breakfast.         . ezetimibe (ZETIA) 10 MG tablet   Oral   Take 10 mg by mouth at bedtime.          . fluticasone (FLONASE) 50 MCG/ACT nasal spray   Each Nare   Place 1-2 sprays into both nostrils at bedtime.          . gabapentin (NEURONTIN) 100 MG capsule   Oral   Take 100 mg by mouth 2 (two) times daily.          Marland Kitchen HYDROcodone-acetaminophen (NORCO/VICODIN) 5-325 MG tablet   Oral   Take 1 tablet by mouth See admin instructions. Take 1 tablet by mouth 2 times a day. Take 1 tablet by mouth during the day as needed for pain.         Marland Kitchen metoCLOPramide (REGLAN) 5 MG tablet   Oral   Take 5 mg by mouth 2 (two) times daily. Take before breakfast and supper.         . mometasone-formoterol (DULERA) 100-5 MCG/ACT AERO   Inhalation   Inhale 2  puffs  into the lungs 2 (two) times daily.          . Multiple Vitamin (MULTIVITAMIN) tablet   Oral   Take 1 tablet by mouth every other day.          . mupirocin ointment (BACTROBAN) 2 %   Topical   Apply 1 application topically daily as needed (for sore on leg.).          Marland Kitchen nebivolol (BYSTOLIC) 5 MG tablet   Oral   Take 1 tablet (5 mg total) by mouth daily.   30 tablet   0   . nitroGLYCERIN (NITROSTAT) 0.4 MG SL tablet      PLACE 1 TABLET (0.4 MG TOTAL) UNDER THE TONGUE EVERY 5 (FIVE) MINUTES AS NEEDED FOR CHEST PAIN.   25 tablet   3   . potassium chloride (K-DUR,KLOR-CON) 10 MEQ tablet   Oral   Take 0.5 tablets (5 mEq total) by mouth 2 (two) times daily as needed. Patient takes this when she takes Bumex. Patient taking differently: Take 5 mEq by mouth 2 (two) times daily. Patient takes this when she takes Bumex.   60 tablet   3   . promethazine (PHENERGAN) 25 MG tablet   Oral   Take 25 mg by mouth every 8 (eight) hours as needed. For nausea and vomiting.      0   . SPIRIVA RESPIMAT 1.25 MCG/ACT AERS   Inhalation   Inhale 1 puff into the lungs daily.      5     Dispense as written.   Marland Kitchen spironolactone (ALDACTONE) 50 MG tablet   Oral   Take 25 mg by mouth every Monday, Wednesday, and Friday. Takes 1 tablet Mon. Wednesday and Friday.         Marland Kitchen arformoterol (BROVANA) 15 MCG/2ML NEBU   Nebulization   Take 2 mLs (15 mcg total) by nebulization 2 (two) times daily.   120 mL   4   . chlorthalidone (HYGROTON) 25 MG tablet   Oral   Take 1 tablet (25 mg total) by mouth daily as needed.   30 tablet   6   . tiotropium (SPIRIVA) 18 MCG inhalation capsule   Inhalation   Place 1 capsule (18 mcg total) into inhaler and inhale daily.   90 capsule   0     For further refills please refill with your primar ...     Allergies Atorvastatin; Clindamycin; Codeine; Doxycycline; Epinephrine; Erythromycin; Fluticasone-salmeterol; Lasix; Levofloxacin; Penicillins;  Propoxyphene hcl; Rosuvastatin; Simvastatin; Singlet; Sulfamethoxazole-trimethoprim; Teriparatide; Tetracyclines & related; Cefaclor; Glipizide; and Lisinopril  Family History  Problem Relation Age of Onset  . Heart attack Mother   . Heart attack Father   . Heart attack Sister     Social History Social History  Substance Use Topics  . Smoking status: Former Smoker -- 0.50 packs/day for 30 years    Types: Cigarettes    Quit date: 10/22/1994  . Smokeless tobacco: Never Used  . Alcohol Use: Yes    Review of Systems [place] fever/chills Eyes: No visual changes. ENT: No sore throat. Cardiovascular: Chest feels tight. Respiratory:shortness of breath. Gastrointestinal: No abdominal pain.  No nausea, no vomiting.  No diarrhea.  No constipation. Genitourinary: Negative for dysuria. Musculoskeletal: Negative for back pain. Skin: Negative for rash. Neurological: Negative for headaches, focal weakness or numbness.  10-point ROS otherwise negative.  ____________________________________________   PHYSICAL EXAM:  VITAL SIGNS: ED Triage Vitals  Enc Vitals Group  BP 11/13/15 1835 95/57 mmHg     Pulse Rate 11/13/15 1737 130     Resp 11/13/15 1737 25     Temp 11/13/15 1737 98.8 F (37.1 C)     Temp Source 11/13/15 1737 Oral     SpO2 11/13/15 1737 97 %     Weight 11/13/15 1737 155 lb (70.308 kg)     Height 11/13/15 1737  (1.626 m)     Head Cir --      Peak Flow --      Pain Score --      Pain Loc --      Pain Edu? --      Excl. in GC? --     Constitutional: Alert and oriented. Patient obviously short of breath working hard to breathe Eyes: Conjunctivae are normal. PERRL. EOMI. Head: Atraumatic. Nose: No congestion/rhinnorhea. Mouth/Throat: Mucous membranes are moist.  Oropharynx non-erythematous. Neck: No stridor. Positive JVD Cardiovascular: Normal rate, regular rhythm. Grossly normal heart sounds.  Good peripheral circulation. Respiratory: Increased  respiratory effort some retractions lungs are tight with scattered wheezes Gastrointestinal: Soft and nontender. No distention. No abdominal bruits. No CVA tenderness. Musculoskeletal: No lower extremity tenderness positive edema  No joint effusions. Neurologic:  Normal speech and language. No gross focal neurologic deficits are appreciated. No gait instability. Skin:  Skin is warm, dry and intact. No rash noted. Psychiatric: Mood and affect are normal. Speech and behavior are normal.  ____________________________________________   LABS (all labs ordered are listed, but only abnormal results are displayed)  Labs Reviewed  COMPREHENSIVE METABOLIC PANEL - Abnormal; Notable for the following:    Chloride 86 (*)    CO2 37 (*)    Glucose, Bld 182 (*)    Calcium 8.4 (*)    All other components within normal limits  BRAIN NATRIURETIC PEPTIDE - Abnormal; Notable for the following:    B Natriuretic Peptide 335.0 (*)    All other components within normal limits  TROPONIN I - Abnormal; Notable for the following:    Troponin I 0.05 (*)    All other components within normal limits  CBC WITH DIFFERENTIAL/PLATELET - Abnormal; Notable for the following:    WBC 17.9 (*)    Hemoglobin 10.3 (*)    HCT 31.9 (*)    RDW 18.4 (*)    Neutro Abs 15.9 (*)    Lymphs Abs 0.7 (*)    Basophils Absolute 0.5 (*)    All other components within normal limits  BASIC METABOLIC PANEL  CBC   ____________________________________________  EKG  EKG is not available ____________________________________________  RADIOLOGY  Chest x-ray read by me as possible CHF radiologist disagrees ____________________________________________   PROCEDURES    ____________________________________________   INITIAL IMPRESSION / ASSESSMENT AND PLAN / ED COURSE  Pertinent labs & imaging results that were available during my care of the patient were reviewed by me and considered in my medical decision making (see chart  for details).   ____________________________________________   FINAL CLINICAL IMPRESSION(S) / ED DIAGNOSES  Final diagnoses:  Hypoxia  COPD exacerbation (HCC)  Elevated troponin      Arnaldo Natal, MD 11/13/15 2106  Patient had an episode of tachycardia which looked like SVT EKG was done read and interpreted by me shows tachycardia wide complex but looks like left bundle-branch block rate related changes patient converted on drinking cold water and is now in sinus tach at a rate of about 104.  Arnaldo Natal, MD 11/13/15 2129

## 2015-11-13 NOTE — Progress Notes (Signed)
Pt is not tolerating 6L [place]. Pt place on Bipap

## 2015-11-13 NOTE — ED Notes (Signed)
Pharmacy called and notified for need of Bumex. States the Pharmacist needs to verify it and they will sent it up.

## 2015-11-13 NOTE — H&P (Addendum)
[facility] - [place] [facility]   PATIENT NAME: Catherine Holmes    MR#:  [id number]  DATE OF BIRTH:  1928-02-18  DATE OF ADMISSION:  11/13/2015  PRIMARY CARE PHYSICIAN: Marisue Ivan, MD   REQUESTING/REFERRING PHYSICIAN: Dr. Dorothea Glassman  CHIEF COMPLAINT:   Chief Complaint  Patient presents with  . Shortness of Breath    HISTORY OF PRESENT ILLNESS:  Catherine Holmes  is a 82 y.o. female with a known history of htn, diabetes mellitus, came in because of shortness of breath. She has been having shortness of breath, followed up with Dr. Dr. Dossie Arbour.: Comes in because of shortness of breath worse today morning associated with orthopnea, PND. No chest pain. Patient has some cough. According to patient and patient's daughter very compliant with medications. Chest x-ray is negative for pneumonia in the emergency room, normal lab work. However because of her shortness of breath she was given a dose of IV Bumex and also started on BiPAP. Now she is at the end of the BiPAP on 2 L of oxygen she is saturating 94%. Able to  Talk full sentences. Patient is very anxious. And the patient's daughter her R leg edema is getting better. But the patient is mostly confined to her recliner]/  PAST MEDICAL HISTORY:   Past Medical History  Diagnosis Date  . Essential hypertension   . Diabetes mellitus type II, controlled (HCC)   . COPD (chronic obstructive pulmonary disease) (HCC)   . Osteoporosis   . Carotid arterial disease (HCC)     a. 2012 Carotid dopplers: 40-59% R ICA, 60-79% L ICA  . Tic douloureux   . Arthralgia   . Sjoegren syndrome (HCC)   . TIA (transient ischemic attack)   . Emphysema   . MVP (mitral valve prolapse)   . Chronic diastolic CHF (congestive heart failure) (HCC)     a. 08/2011 Echo: EF 65-70%, mild LVH, mod dil LA, mildly dil RA, mild MR, mild-mod AoV Sclerosis w/o stenosis, mod-sev elev PA pressures, mild TR.  Marland Kitchen Acid reflux   . Diverticulitis   .  Anemia   . History of pneumonia   . Syncope and collapse   . Anemia   . Hip fracture, left (HCC)   . Hyperlipidemia   . Hiatal hernia   . Chest pain     a. 08/2011 MV: EF 74%, no ischemia.  . Noncompliance     a. h/o not taking bumex as Rx.  Marland Kitchen PAF (paroxysmal atrial fibrillation) (HCC)     a. CHA2DS2VASc = 8 ->No OAC 2/2 falls.    PAST SURGICAL HISTOIRY:   Past Surgical History  Procedure Laterality Date  . Cholecystectomy  1965  . Abdominal hysterectomy  1964  . Cataract extraction Bilateral   . Hip fracture surgery  2014    left hip    SOCIAL HISTORY:   Social History  Substance Use Topics  . Smoking status: Former Smoker -- 0.50 packs/day for 30 years    Types: Cigarettes    Quit date: 10/22/1994  . Smokeless tobacco: Never Used  . Alcohol Use: Yes    FAMILY HISTORY:   Family History  Problem Relation Age of Onset  . Heart attack Mother   . Heart attack Father   . Heart attack Sister     DRUG ALLERGIES:   Allergies  Allergen Reactions  . Atorvastatin   . Clindamycin Diarrhea, Other (See Comments) and Nausea Only  . Codeine   .  Doxycycline   . Epinephrine Hives and Itching  . Erythromycin   . Fluticasone-Salmeterol   . Lasix [Furosemide] Itching  . Levofloxacin   . Penicillins   . Propoxyphene Hcl   . Rosuvastatin   . Simvastatin   . Singlet [Chlorphen-Pe-Acetaminophen] Other (See Comments)    Reaction: Wheezing  . Sulfamethoxazole-Trimethoprim   . Teriparatide   . Tetracyclines & Related Other (See Comments)    unknown  . Cefaclor Rash and Other (See Comments)    Reaction: Delirious    . Glipizide Hives  . Lisinopril Hives and Itching    REVIEW OF SYSTEMS:  CONSTITUTIONAL: No fever, fatigue or weakness.  EYES: No blurred or double vision.  EARS, NOSE, AND THROAT: No tinnitus or ear pain.  RESPIRATORY: Cough, shortness of breath. Today CARDIOVASCULAR: No chest pain, orthopnea, edema.  GASTROINTESTINAL: No nausea, vomiting, diarrhea  or abdominal pain.  GENITOURINARY: No dysuria, hematuria.  ENDOCRINE: No polyuria, nocturia,  HEMATOLOGY: No anemia, easy bruising or bleeding SKIN: No rash or lesion. MUSCULOSKELETAL: No joint pain or arthritis.   NEUROLOGIC: No tingling, numbness, weakness.  PSYCHIATRY: No anxiety or depression.   MEDICATIONS AT HOME:   Prior to Admission medications   Medication Sig Start Date End Date Taking? Authorizing Provider  albuterol (PROAIR HFA) 108 (90 BASE) MCG/ACT inhaler Inhale 2 puffs into the lungs every 6 (six) hours as needed. 01/24/12  Yes Antonieta Iba, MD  albuterol (PROVENTIL) (2.5 MG/3ML) 0.083% nebulizer solution Take 2.5 mg by nebulization every 6 (six) hours as needed for wheezing or shortness of breath.    Yes Historical Provider, MD  aspirin EC 81 MG tablet Take 81 mg by mouth daily.   Yes Historical Provider, MD  beta carotene w/minerals (OCUVITE) tablet Take 1 tablet by mouth 2 (two) times daily.   Yes Historical Provider, MD  bumetanide (BUMEX) 2 MG tablet Take 1 tablet (2 mg total) by mouth 2 (two) times daily as needed. Take 1 tab daily Patient taking differently: Take 2 mg by mouth 2 (two) times daily. Take 1 tab daily 08/19/15  Yes Antonieta Iba, MD  calcium carbonate (OS-CAL) 600 MG TABS tablet Take 600 mg by mouth daily with breakfast.   Yes Historical Provider, MD  chlorhexidine (PERIDEX) 0.12 % solution by Mouth Rinse route 2 (two) times daily. Rinse and spit 2 times a day for 30 seconds. 10/09/15  Yes Historical Provider, MD  citalopram (CELEXA) 20 MG tablet Take 20 mg by mouth daily with lunch.    Yes Historical Provider, MD  diphenhydramine-acetaminophen (TYLENOL PM) 25-500 MG TABS Take 1 tablet by mouth at bedtime as needed (for sleep.).    Yes Historical Provider, MD  doxazosin (CARDURA) 4 MG tablet Take 4 mg by mouth at bedtime.  05/31/15  Yes Antonieta Iba, MD  esomeprazole (NEXIUM) 40 MG capsule Take 40 mg by mouth daily before breakfast.   Yes Historical  Provider, MD  ezetimibe (ZETIA) 10 MG tablet Take 10 mg by mouth at bedtime.    Yes Historical Provider, MD  fluticasone (FLONASE) 50 MCG/ACT nasal spray Place 1-2 sprays into both nostrils at bedtime.    Yes Historical Provider, MD  gabapentin (NEURONTIN) 100 MG capsule Take 100 mg by mouth 2 (two) times daily.    Yes Historical Provider, MD  HYDROcodone-acetaminophen (NORCO/VICODIN) 5-325 MG tablet Take 1 tablet by mouth See admin instructions. Take 1 tablet by mouth 2 times a day. Take 1 tablet by mouth during the day as needed for  pain.   Yes Historical Provider, MD  metoCLOPramide (REGLAN) 5 MG tablet Take 5 mg by mouth 2 (two) times daily. Take before breakfast and supper.   Yes Historical Provider, MD  mometasone-formoterol (DULERA) 100-5 MCG/ACT AERO Inhale 2 puffs into the lungs 2 (two) times daily.    Yes Historical Provider, MD  Multiple Vitamin (MULTIVITAMIN) tablet Take 1 tablet by mouth every other day.    Yes Historical Provider, MD  mupirocin ointment (BACTROBAN) 2 % Apply 1 application topically daily as needed (for sore on leg.).    Yes Historical Provider, MD  nebivolol (BYSTOLIC) 5 MG tablet Take 1 tablet (5 mg total) by mouth daily. 04/03/15  Yes Sital Mody, MD  nitroGLYCERIN (NITROSTAT) 0.4 MG SL tablet PLACE 1 TABLET (0.4 MG TOTAL) UNDER THE TONGUE EVERY 5 (FIVE) MINUTES AS NEEDED FOR CHEST PAIN. 10/18/15  Yes Antonieta Iba, MD  potassium chloride (K-DUR,KLOR-CON) 10 MEQ tablet Take 0.5 tablets (5 mEq total) by mouth 2 (two) times daily as needed. Patient takes this when she takes Bumex. Patient taking differently: Take 5 mEq by mouth 2 (two) times daily. Patient takes this when she takes Bumex. 06/09/15  Yes Antonieta Iba, MD  promethazine (PHENERGAN) 25 MG tablet Take 25 mg by mouth every 8 (eight) hours as needed. For nausea and vomiting. 09/21/15  Yes Historical Provider, MD  SPIRIVA RESPIMAT 1.25 MCG/ACT AERS Inhale 1 puff into the lungs daily. 10/18/15  Yes Historical  Provider, MD  spironolactone (ALDACTONE) 50 MG tablet Take 25 mg by mouth every Monday, Wednesday, and Friday. Takes 1 tablet Mon. Wednesday and Friday.   Yes Historical Provider, MD  arformoterol (BROVANA) 15 MCG/2ML NEBU Take 2 mLs (15 mcg total) by nebulization 2 (two) times daily. 12/26/11   Lupita Leash, MD  chlorthalidone (HYGROTON) 25 MG tablet Take 1 tablet (25 mg total) by mouth daily as needed. 09/29/15   Antonieta Iba, MD  tiotropium (SPIRIVA) 18 MCG inhalation capsule Place 1 capsule (18 mcg total) into inhaler and inhale daily. 01/24/12   Antonieta Iba, MD      VITAL SIGNS:  Blood pressure 95/57, pulse 112, temperature 98.8 F (37.1 C), temperature source Oral, resp. rate 28, height  (1.626 m), weight 70.308 kg (155 lb), SpO2 100 %.  PHYSICAL EXAMINATION:  GENERAL:  82 y.o.-year-old patient lying in the bed with no acute distress.  EYES: Pupils equal, round, reactive to light and accommodation. No scleral icterus. Extraocular muscles intact.  HEENT: Head atraumatic, normocephalic. Oropharynx and nasopharynx clear.  NECK:  Supple, no jugular venous distention. No thyroid enlargement, no tenderness.  LUNGS: Bilateral basilar crepitations present and also mild expiratory wheeze present on the right lung. CARDIOVASCULAR: S1, S2 normal. No murmurs, rubs, or gallops.  ABDOMEN: Soft, nontender, nondistended. Bowel sounds present. No organomegaly or mass.  EXTREMITIES ;trace pedal edema present ,cyanosis, or clubbing.  NEUROLOGIC: Cranial nerves II through XII are intact. Muscle strength 5/5 in all extremities. Sensation intact. Gait not checked.  PSYCHIATRIC: The patient is alert and oriented x 3.  SKIN: No obvious rash, lesion, or ulcer.   LABORATORY PANEL:   CBC  Recent Labs Lab 11/13/15 1809  WBC 17.9*  HGB 10.3*  HCT 31.9*  PLT 377    ------------------------------------------------------------------------------------------------------------------  Chemistries   Recent Labs Lab 11/13/15 1809  NA 136  K 3.5  CL 86*  CO2 37*  GLUCOSE 182*  BUN 15  CREATININE 0.57  CALCIUM 8.4*  AST 20  ALT  15  ALKPHOS 99  BILITOT 0.9   ------------------------------------------------------------------------------------------------------------------  Cardiac Enzymes  Recent Labs Lab 11/13/15 1809  TROPONINI 0.05*   ------------------------------------------------------------------------------------------------------------------  RADIOLOGY:  Dg Chest Portable 1 View  11/13/2015  CLINICAL DATA:  82 year old female with shortness of Breath. Chills for 1 hour. COPD with home oxygen. Initial encounter. EXAM: PORTABLE CHEST 1 VIEW COMPARISON:  10/11/2015 and earlier. FINDINGS: Portable AP upright view at 1741 hours. Stable to mildly lower lung volumes. Stable cardiomegaly and mediastinal contours. Lung markings appear stable with no convincing acute pulmonary opacity, pneumothorax or pleural effusion. Visualized tracheal air column is within normal limits. IMPRESSION: Stable.  No acute cardiopulmonary abnormality identified. Electronically Signed   By: Odessa Fleming M.D.   On: 11/13/2015 17:53    EKG:   Orders placed or performed during the hospital encounter of 11/13/15  . ED EKG  . ED EKG   EKG shows rhythm is 70 bpm.  IMPRESSION AND PLAN:   1 COPD exacerbation: Patient is given Solu-Medrol, start on IV antibiotics for possible bronchitis. Continue nebulizers, steroids. #2. Chronic diastolic heart failure: Patient is on Bumex, Aldactone. We will do IV Bumex daily tomorrow and change to by mouth Bumex. I don't think she had heart failure at this time. A consult cardiology Dr. Jorja Loa: Mariah Milling , As patient's family and patient wishes to see cardiology. #3 history of hyperlipidemia continue Zetia.  4,history of COPD on oxygen 2 L at  home: Continue oxygen, albuterol, Flonase, keep the saturations around 89-90%. Because of severe COPD she can be maintained at saturations 89-90% so titrate oxygen, discontinue BiPAP that she was on before. Chest x-ray is clear. Patient is very anxious., A small dose of Xanax 0.5 mg one time.   All the records are reviewed and case discussed with ED provider. Management plans discussed with the patient, family and they are in agreement.  CODE STATUS: full  TOTAL TIME TAKING CARE OF THIS PATIENT: .  Addendum: Patient BiPAP  Was turned off and started on  5 L, initially C saturated around 89% but now she is on BiPAP because of hypoxia saturation 87%. So we are admitting her to CCU stepdown. Katha Hamming M.D on 11/13/2015 at 8:17 PM  Between 7am to 6pm - Pager - [phone]  After 6pm go to [email or web address] - password [email or web address] [place]  [place] [facility]  Office  [phone]  CC: Primary care physician; Marisue Ivan, MD  Note: This dictation was prepared with Dragon dictation along with smaller phrase technology. Any transcriptional errors that result from this process are unintentional.

## 2015-11-13 NOTE — ED Notes (Signed)
Patient turned up to 4L [place] due to SpO2 86%. Patient now 93%. Will ween as appropriate.

## 2015-11-13 NOTE — ED Notes (Signed)
Attempted to call report to 2A. Claris Gower, RN states she will not accept patient due to low SpO2 levels. Admitting MD and charge nurse aware. Respiratory called to place patient back on BIPAP.

## 2015-11-13 NOTE — Progress Notes (Signed)
ANTIBIOTIC CONSULT NOTE - INITIAL  Pharmacy Consult for Aztreonam, Meropenem Indication: pneumonia  Allergies  Allergen Reactions  . Atorvastatin   . Clindamycin Diarrhea, Other (See Comments) and Nausea Only  . Codeine   . Doxycycline   . Epinephrine Hives and Itching  . Erythromycin   . Fluticasone-Salmeterol   . Lasix [Furosemide] Itching  . Levofloxacin   . Penicillins   . Propoxyphene Hcl   . Rosuvastatin   . Simvastatin   . Singlet [Chlorphen-Pe-Acetaminophen] Other (See Comments)    Reaction: Wheezing  . Sulfamethoxazole-Trimethoprim   . Teriparatide   . Tetracyclines & Related Other (See Comments)    unknown  . Cefaclor Rash and Other (See Comments)    Reaction: Delirious    . Glipizide Hives  . Lisinopril Hives and Itching    Patient Measurements: Height:  (162.6 cm) Weight: 155 lb (70.308 kg) IBW/kg (Calculated) : 54.7 Adjusted Body Weight:   Vital Signs: Temp: 98.8 F (37.1 C) (01/22 1737) Temp Source: Oral (01/22 1737) BP: 117/72 mmHg (01/22 2000) Pulse Rate: 113 (01/22 2000) Intake/Output from previous day:   Intake/Output from this shift:    Labs:  Recent Labs  11/13/15 1809  WBC 17.9*  HGB 10.3*  PLT 377  CREATININE 0.57   Estimated Creatinine Clearance: 49.4 mL/min (by C-G formula based on Cr of 0.57). No results for input(s): VANCOTROUGH, VANCOPEAK, VANCORANDOM, GENTTROUGH, GENTPEAK, GENTRANDOM, TOBRATROUGH, TOBRAPEAK, TOBRARND, AMIKACINPEAK, AMIKACINTROU, AMIKACIN in the last 72 hours.   Microbiology: No results found for this or any previous visit (from the past 720 hour(s)).  Medical History: Past Medical History  Diagnosis Date  . Essential hypertension   . Diabetes mellitus type II, controlled (HCC)   . COPD (chronic obstructive pulmonary disease) (HCC)   . Osteoporosis   . Carotid arterial disease (HCC)     a. 2012 Carotid dopplers: 40-59% R ICA, 60-79% L ICA  . Tic douloureux   . Arthralgia   . Sjoegren syndrome  (HCC)   . TIA (transient ischemic attack)   . Emphysema   . MVP (mitral valve prolapse)   . Chronic diastolic CHF (congestive heart failure) (HCC)     a. 08/2011 Echo: EF 65-70%, mild LVH, mod dil LA, mildly dil RA, mild MR, mild-mod AoV Sclerosis w/o stenosis, mod-sev elev PA pressures, mild TR.  Marland Kitchen Acid reflux   . Diverticulitis   . Anemia   . History of pneumonia   . Syncope and collapse   . Anemia   . Hip fracture, left (HCC)   . Hyperlipidemia   . Hiatal hernia   . Chest pain     a. 08/2011 MV: EF 74%, no ischemia.  . Noncompliance     a. h/o not taking bumex as Rx.  Marland Kitchen PAF (paroxysmal atrial fibrillation) (HCC)     a. CHA2DS2VASc = 8 ->No OAC 2/2 falls.    Medications:   (Not in a hospital admission) Assessment: Pharmacy consulted to dose meropenem and aztreonam in this 82 year old female admitted with CAP, multiple drug allergies.   Goal of Therapy:  resolution of infection  Plan:  Expected duration 7 days with resolution of temperature and/or normalization of WBC   Meropenem 2 gm IV Q8H ordered 1/22.   Aztreonam 1 gm IV Q8H ordered to start on 1/22.  Glady Ouderkirk D 11/13/2015,8:49 PM

## 2015-11-13 NOTE — ED Notes (Signed)
Per EMS, patient comes from home and presents with shortness of breath. An hour ago patient began feeling cold and shaky. Patient has a hx of COPD. Patient wears 2.5L of O2 at home. EMS states patient was stating in the high 70s. Patient is stating 97% on 2.5L at this time. EMS gave two duoneb treatments and 125 of solu-medrol.

## 2015-11-13 NOTE — ED Notes (Addendum)
Respiratory called to placed patient on bipap per order.

## 2015-11-13 NOTE — Progress Notes (Signed)
Pt placed on 4l [place]. Bipap on standby

## 2015-11-13 NOTE — ED Notes (Addendum)
RT at bedside to place patient on BIPAP

## 2015-11-13 NOTE — ED Notes (Signed)
Patient calm and tolerating BiPaP well. IV medications infusing well.

## 2015-11-13 NOTE — ED Notes (Signed)
Patient went into SVT. EKG obtained. MD aware and at bedside. Patient self converted on her own. Will continue to monitor.

## 2015-11-13 NOTE — ED Notes (Signed)
RT called to come fix air leak from BIPAP.

## 2015-11-13 NOTE — ED Notes (Signed)
Patient states she is having difficulty breathing. Admitting MD aware. Patient currently on 5L [place] per Admitting MD orders. Asked MD if she would like to place the patient back on BIPAP. MD states, "She has COPD her stats are good for her".

## 2015-11-14 DIAGNOSIS — I10 Essential (primary) hypertension: Secondary | ICD-10-CM

## 2015-11-14 DIAGNOSIS — I5033 Acute on chronic diastolic (congestive) heart failure: Secondary | ICD-10-CM

## 2015-11-14 DIAGNOSIS — J9621 Acute and chronic respiratory failure with hypoxia: Principal | ICD-10-CM

## 2015-11-14 DIAGNOSIS — L8990 Pressure ulcer of unspecified site, unspecified stage: Secondary | ICD-10-CM | POA: Insufficient documentation

## 2015-11-14 DIAGNOSIS — J441 Chronic obstructive pulmonary disease with (acute) exacerbation: Secondary | ICD-10-CM

## 2015-11-14 LAB — BASIC METABOLIC PANEL
Anion gap: 11 (ref 5–15)
BUN: 13 mg/dL (ref 6–20)
CHLORIDE: 87 mmol/L — AB (ref 101–111)
CO2: 39 mmol/L — ABNORMAL HIGH (ref 22–32)
Calcium: 8.1 mg/dL — ABNORMAL LOW (ref 8.9–10.3)
Creatinine, Ser: 0.56 mg/dL (ref 0.44–1.00)
GFR calc Af Amer: >60 mL/min (ref >60)
GFR calc non Af Amer: >60 mL/min (ref >60)
GLUCOSE: 241 mg/dL — AB (ref 65–99)
POTASSIUM: 3.2 mmol/L — AB (ref 3.5–5.1)
Sodium: 137 mmol/L (ref 135–145)

## 2015-11-14 LAB — GLUCOSE, CAPILLARY
Glucose-Capillary: 226 mg/dL — ABNORMAL HIGH (ref 65–99)
Glucose-Capillary: 200 mg/dL — ABNORMAL HIGH (ref 65–99)
Glucose-Capillary: 235 mg/dL — ABNORMAL HIGH (ref 65–99)
Glucose-Capillary: 110 mg/dL — ABNORMAL HIGH (ref 65–99)

## 2015-11-14 LAB — CBC
HEMATOCRIT: 29.2 % — AB (ref 35.0–47.0)
Hemoglobin: 9.4 g/dL — ABNORMAL LOW (ref 12.0–16.0)
MCH: 27.5 pg (ref 26.0–34.0)
MCHC: 32.3 g/dL (ref 32.0–36.0)
MCV: 85.1 fL (ref 80.0–100.0)
Platelets: 339 10*3/uL (ref 150–440)
RBC: 3.43 MIL/uL — ABNORMAL LOW (ref 3.80–5.20)
RDW: 17.6 % — AB (ref 11.5–14.5)
WBC: 12.5 10*3/uL — ABNORMAL HIGH (ref 3.6–11.0)

## 2015-11-14 LAB — MRSA PCR SCREENING: MRSA by PCR: NEGATIVE

## 2015-11-14 MED ORDER — SODIUM CHLORIDE 0.9 % IV SOLN
1.0000 g | Freq: Two times a day (BID) | INTRAVENOUS | Status: DC
  Administered 2015-11-14: 1 g via INTRAVENOUS
  Filled 2015-11-14 (×4): qty 1

## 2015-11-14 MED ORDER — METHYLPREDNISOLONE SODIUM SUCC 40 MG IJ SOLR
40.0000 mg | INTRAMUSCULAR | Status: DC
  Administered 2015-11-14: 40 mg via INTRAVENOUS
  Filled 2015-11-14: qty 1

## 2015-11-14 MED ORDER — POTASSIUM CHLORIDE 20 MEQ/15ML (10%) PO SOLN
40.0000 meq | Freq: Once | ORAL | Status: AC
  Administered 2015-11-14: 40 meq via ORAL
  Filled 2015-11-14: qty 30

## 2015-11-14 MED ORDER — ENSURE ENLIVE PO LIQD
237.0000 mL | Freq: Two times a day (BID) | ORAL | Status: DC
  Administered 2015-11-14 – 2015-11-19 (×6): 237 mL via ORAL

## 2015-11-14 MED ORDER — POTASSIUM CHLORIDE 10 MEQ/100ML IV SOLN
10.0000 meq | INTRAVENOUS | Status: DC
  Administered 2015-11-14: 10 meq via INTRAVENOUS
  Filled 2015-11-14 (×4): qty 100

## 2015-11-14 MED ORDER — ALPRAZOLAM 0.25 MG PO TABS
0.2500 mg | ORAL_TABLET | Freq: Three times a day (TID) | ORAL | Status: DC | PRN
  Administered 2015-11-14 – 2015-11-21 (×13): 0.25 mg via ORAL
  Filled 2015-11-14 (×13): qty 1

## 2015-11-14 MED ORDER — INSULIN ASPART 100 UNIT/ML ~~LOC~~ SOLN
0.0000 [IU] | Freq: Every day | SUBCUTANEOUS | Status: DC
  Administered 2015-11-15: 2 [IU] via SUBCUTANEOUS
  Administered 2015-11-17: 3 [IU] via SUBCUTANEOUS
  Administered 2015-11-19: 22:00:00 2 [IU] via SUBCUTANEOUS
  Administered 2015-11-20: 22:00:00 4 [IU] via SUBCUTANEOUS
  Filled 2015-11-14 (×2): qty 2
  Filled 2015-11-14: qty 5
  Filled 2015-11-14: qty 4

## 2015-11-14 MED ORDER — INSULIN ASPART 100 UNIT/ML ~~LOC~~ SOLN
0.0000 [IU] | Freq: Three times a day (TID) | SUBCUTANEOUS | Status: DC
  Administered 2015-11-14: 2 [IU] via SUBCUTANEOUS
  Administered 2015-11-14: 3 [IU] via SUBCUTANEOUS
  Administered 2015-11-15 (×3): 2 [IU] via SUBCUTANEOUS
  Administered 2015-11-16: 1 [IU] via SUBCUTANEOUS
  Administered 2015-11-16: 2 [IU] via SUBCUTANEOUS
  Administered 2015-11-16: 1 [IU] via SUBCUTANEOUS
  Administered 2015-11-17: 3 [IU] via SUBCUTANEOUS
  Administered 2015-11-17: 1 [IU] via SUBCUTANEOUS
  Administered 2015-11-18: 2 [IU] via SUBCUTANEOUS
  Administered 2015-11-18: 5 [IU] via SUBCUTANEOUS
  Administered 2015-11-19: 7 [IU] via SUBCUTANEOUS
  Administered 2015-11-19: 12:00:00 2 [IU] via SUBCUTANEOUS
  Administered 2015-11-19: 1 [IU] via SUBCUTANEOUS
  Administered 2015-11-20 – 2015-11-21 (×3): 2 [IU] via SUBCUTANEOUS
  Administered 2015-11-21: 09:00:00 1 [IU] via SUBCUTANEOUS
  Administered 2015-11-21: 17:00:00 2 [IU] via SUBCUTANEOUS
  Filled 2015-11-14 (×2): qty 3
  Filled 2015-11-14 (×3): qty 2
  Filled 2015-11-14: qty 5
  Filled 2015-11-14: qty 2
  Filled 2015-11-14: qty 1
  Filled 2015-11-14: qty 2
  Filled 2015-11-14 (×3): qty 1
  Filled 2015-11-14: qty 3
  Filled 2015-11-14: qty 1
  Filled 2015-11-14 (×3): qty 2
  Filled 2015-11-14: qty 7
  Filled 2015-11-14 (×2): qty 2

## 2015-11-14 MED ORDER — BUDESONIDE 0.5 MG/2ML IN SUSP
0.5000 mg | Freq: Two times a day (BID) | RESPIRATORY_TRACT | Status: AC
  Administered 2015-11-14 – 2015-11-16 (×5): 0.5 mg via RESPIRATORY_TRACT
  Filled 2015-11-14 (×5): qty 2

## 2015-11-14 NOTE — Progress Notes (Signed)
Inpatient Diabetes Program Recommendations  AACE/ADA: New Consensus Statement on Inpatient Glycemic Control (2015)  Target Ranges:  Prepandial:   less than 140 mg/dL      Peak postprandial:   less than 180 mg/dL (1-2 hours)      Critically ill patients:  140 - 180 mg/dL  Results for NAIROBI, GUSTAFSON (MRN [id number]) as of 11/14/2015 11:20  Ref. Range 11/13/2015 18:09 11/14/2015 04:48  Glucose Latest Ref Range: 65-99 mg/dL 409 (H) 811 (H)   Review of Glycemic Control  Diabetes history: No Outpatient Diabetes medications: NA Current orders for Inpatient glycemic control: None  Inpatient Diabetes Program Recommendations: Correction (SSI): Noted lab glucose 241 mg/dl at 9:14 am today. Patient is ordered Solumedrol 60 mg Q24H which is likely cause of hyperglycemia.  While inpatient and ordered steroids, please consider ordering CBGs wtih Novolog correction scale. HgbA1C: May want to consider ordering an A1C to evaluate glycemic control over the past 2-3 months.  Thanks, Orlando Penner, RN, MSN, CDE Diabetes Coordinator Inpatient Diabetes Program [phone] (Team Pager from 8am to 5pm) [phone] (AP office) [phone] [facility] office) [phone] [facility] office)

## 2015-11-14 NOTE — Progress Notes (Signed)
[facility] - [place] at [facility]   PATIENT NAME: Catherine Holmes    MR#:  [id number]  DATE OF BIRTH:  October 27, 1927  SUBJECTIVE:  CHIEF COMPLAINT:   Chief Complaint  Patient presents with  . Shortness of Breath     Came with respi distress, there was requirement of bipap, but now on nasal canula oxygen. When I saw around 2 PM- she was also very anxious.  REVIEW OF SYSTEMS:  CONSTITUTIONAL: No fever, fatigue or weakness.  EYES: No blurred or double vision.  EARS, NOSE, AND THROAT: No tinnitus or ear pain.  RESPIRATORY: positive for cough, shortness of breath, wheezing but no hemoptysis.  CARDIOVASCULAR: No chest pain, orthopnea, edema.  GASTROINTESTINAL: No nausea, vomiting, diarrhea or abdominal pain.  GENITOURINARY: No dysuria, hematuria.  ENDOCRINE: No polyuria, nocturia,  HEMATOLOGY: No anemia, easy bruising or bleeding SKIN: No rash or lesion. MUSCULOSKELETAL: No joint pain or arthritis.   NEUROLOGIC: No tingling, numbness, weakness.  PSYCHIATRY: No anxiety or depression.   ROS  DRUG ALLERGIES:   Allergies  Allergen Reactions  . Atorvastatin   . Clindamycin Diarrhea, Other (See Comments) and Nausea Only  . Codeine   . Doxycycline   . Epinephrine Hives and Itching  . Erythromycin   . Fluticasone-Salmeterol   . Lasix [Furosemide] Itching  . Levofloxacin   . Penicillins   . Propoxyphene Hcl   . Rosuvastatin   . Simvastatin   . Singlet [Chlorphen-Pe-Acetaminophen] Other (See Comments)    Reaction: Wheezing  . Sulfamethoxazole-Trimethoprim   . Teriparatide   . Tetracyclines & Related Other (See Comments)    unknown  . Cefaclor Rash and Other (See Comments)    Reaction: Delirious    . Glipizide Hives  . Lisinopril Hives and Itching    VITALS:  Blood pressure 114/50, pulse 78, temperature 98.1 F (36.7 C), temperature source Oral, resp. rate 26, height  (1.626 m), weight 70.308 kg (155 lb), SpO2 97 %.  PHYSICAL EXAMINATION:   GENERAL:  82 y.o.-year-old patient lying in the bed with no acute distress.appears anxious.  EYES: Pupils equal, round, reactive to light and accommodation. No scleral icterus. Extraocular muscles intact.  HEENT: Head atraumatic, normocephalic. Oropharynx and nasopharynx clear.  NECK:  Supple, no jugular venous distention. No thyroid enlargement, no tenderness.  LUNGS: Normal breath sounds bilaterally, mild wheezing, no rales,rhonchi or crepitation. No use of accessory muscles of respiration. On nasal canula oxygen supplement. CARDIOVASCULAR: S1, S2 normal. No murmurs, rubs, or gallops.  ABDOMEN: Soft, nontender, nondistended. Bowel sounds present. No organomegaly or mass.  EXTREMITIES: No pedal edema, cyanosis, or clubbing.  NEUROLOGIC: Cranial nerves II through XII are intact. Muscle strength 5/5 in all extremities. Sensation intact. Gait not checked.  PSYCHIATRIC: The patient is alert and oriented x 3. Anxious now. SKIN: No obvious rash, lesion, or ulcer.   Physical Exam LABORATORY PANEL:   CBC  Recent Labs Lab 11/14/15 0448  WBC 12.5*  HGB 9.4*  HCT 29.2*  PLT 339   ------------------------------------------------------------------------------------------------------------------  Chemistries   Recent Labs Lab 11/13/15 1809 11/14/15 0448  NA 136 137  K 3.5 3.2*  CL 86* 87*  CO2 37* 39*  GLUCOSE 182* 241*  BUN 15 13  CREATININE 0.57 0.56  CALCIUM 8.4* 8.1*  AST 20  --   ALT 15  --   ALKPHOS 99  --   BILITOT 0.9  --    ------------------------------------------------------------------------------------------------------------------  Cardiac Enzymes  Recent Labs Lab 11/13/15 1809  TROPONINI 0.05*   ------------------------------------------------------------------------------------------------------------------  RADIOLOGY:  Dg Chest Portable 1 View  11/13/2015  CLINICAL DATA:  82 year old female with shortness of Breath. Chills for 1 hour. COPD with home  oxygen. Initial encounter. EXAM: PORTABLE CHEST 1 VIEW COMPARISON:  10/11/2015 and earlier. FINDINGS: Portable AP upright view at 1741 hours. Stable to mildly lower lung volumes. Stable cardiomegaly and mediastinal contours. Lung markings appear stable with no convincing acute pulmonary opacity, pneumothorax or pleural effusion. Visualized tracheal air column is within normal limits. IMPRESSION: Stable.  No acute cardiopulmonary abnormality identified. Electronically Signed   By: Odessa Fleming M.D.   On: 11/13/2015 17:53    ASSESSMENT AND PLAN:   Active Problems:   COPD exacerbation (HCC)   Acute on chronic respiratory failure (HCC)   Pressure ulcer  * Ac on ch respi failure due to COPD exacerbation.   IV steroids, nebs, Abx.   Required Bipap, now on nasal canula.   Component of anxiety also there.   Keep on xanax as needed.  * Ch diastolci CHF   No exacerbation this time.   On diuretics.  * Hyperlipidemia   Cont Zetia.  * anxiety    Xanax.  * hypokalemia   Replace , recheck.   All the records are reviewed and case discussed with Care Management/Social Workerr. Management plans discussed with the patient, family and they are in agreement.  CODE STATUS: Full.  TOTAL TIME TAKING CARE OF THIS PATIENT: 35 minutes.    POSSIBLE D/C IN 1-2 DAYS, DEPENDING ON CLINICAL CONDITION.   Altamese Dilling M.D on 11/14/2015   Between 7am to 6pm - Pager - [phone]  After 6pm go to [email or web address] - password [email or web address] [facility]  [facility]  Office  [phone]  CC: Primary care physician; Marisue Ivan, MD  Note: This dictation was prepared with Dragon dictation along with smaller phrase technology. Any transcriptional errors that result from this process are unintentional.

## 2015-11-14 NOTE — Progress Notes (Signed)
Patient cannot tolerate IV potassium. She is yelling and crying out this is killing me. She has only received 1/2 of the first bag. Pharmacy informed and new orders received.

## 2015-11-14 NOTE — Progress Notes (Signed)
ANTIBIOTIC CONSULT NOTE - INITIAL  Pharmacy Consult for Meropenem Indication: pneumonia  Allergies  Allergen Reactions  . Atorvastatin   . Clindamycin Diarrhea, Other (See Comments) and Nausea Only  . Codeine   . Doxycycline   . Epinephrine Hives and Itching  . Erythromycin   . Fluticasone-Salmeterol   . Lasix [Furosemide] Itching  . Levofloxacin   . Penicillins   . Propoxyphene Hcl   . Rosuvastatin   . Simvastatin   . Singlet [Chlorphen-Pe-Acetaminophen] Other (See Comments)    Reaction: Wheezing  . Sulfamethoxazole-Trimethoprim   . Teriparatide   . Tetracyclines & Related Other (See Comments)    unknown  . Cefaclor Rash and Other (See Comments)    Reaction: Delirious    . Glipizide Hives  . Lisinopril Hives and Itching    Patient Measurements: Height:  (162.6 cm) Weight: 155 lb (70.308 kg) IBW/kg (Calculated) : 54.7 Adjusted Body Weight:   Vital Signs: Temp: 98.2 F (36.8 C) (01/23 1155) Temp Source: Oral (01/23 1155) BP: 133/70 mmHg (01/23 0900) Pulse Rate: 79 (01/23 0900) Intake/Output from previous day: 01/22 0701 - 01/23 0700 In: 250 [IV Piggyback:250] Out: 350 [Urine:350] Intake/Output from this shift:    Labs:  Recent Labs  11/13/15 1809 11/14/15 0448  WBC 17.9* 12.5*  HGB 10.3* 9.4*  PLT 377 339  CREATININE 0.57 0.56   Estimated Creatinine Clearance: 49.4 mL/min (by C-G formula based on Cr of 0.56). No results for input(s): VANCOTROUGH, VANCOPEAK, VANCORANDOM, GENTTROUGH, GENTPEAK, GENTRANDOM, TOBRATROUGH, TOBRAPEAK, TOBRARND, AMIKACINPEAK, AMIKACINTROU, AMIKACIN in the last 72 hours.   Microbiology: Recent Results (from the past 720 hour(s))  MRSA PCR Screening     Status: None   Collection Time: 11/13/15  5:28 PM  Result Value Ref Range Status   MRSA by PCR NEGATIVE NEGATIVE Final    Comment:        The GeneXpert MRSA Assay (FDA approved for NASAL specimens only), is one component of a comprehensive MRSA  colonization surveillance program. It is not intended to diagnose MRSA infection nor to guide or monitor treatment for MRSA infections.     Medical History: Past Medical History  Diagnosis Date  . Essential hypertension   . Diabetes mellitus type II, controlled (HCC)   . COPD (chronic obstructive pulmonary disease) (HCC)   . Osteoporosis   . Carotid arterial disease (HCC)     a. 2012 Carotid dopplers: 40-59% R ICA, 60-79% L ICA  . Tic douloureux   . Arthralgia   . Sjoegren syndrome (HCC)   . TIA (transient ischemic attack)   . Emphysema   . MVP (mitral valve prolapse)   . Chronic diastolic CHF (congestive heart failure) (HCC)     a. 08/2011 Echo: EF 65-70%, mild LVH, mod dil LA, mildly dil RA, mild MR, mild-mod AoV Sclerosis w/o stenosis, mod-sev elev PA pressures, mild TR.  Marland Kitchen Acid reflux   . Diverticulitis   . Anemia   . History of pneumonia   . Syncope and collapse   . Anemia   . Hip fracture, left (HCC)   . Hyperlipidemia   . Hiatal hernia   . Chest pain     a. 08/2011 MV: EF 74%, no ischemia.  . Noncompliance     a. h/o not taking bumex as Rx.  Marland Kitchen PAF (paroxysmal atrial fibrillation) (HCC)     a. CHA2DS2VASc = 8 ->No OAC 2/2 falls.    Medications:  Prescriptions prior to admission  Medication Sig Dispense Refill Last Dose  .  albuterol (PROAIR HFA) 108 (90 BASE) MCG/ACT inhaler Inhale 2 puffs into the lungs every 6 (six) hours as needed. 3 Inhaler 0 prn  . albuterol (PROVENTIL) (2.5 MG/3ML) 0.083% nebulizer solution Take 2.5 mg by nebulization every 6 (six) hours as needed for wheezing or shortness of breath.    11/13/2015 at Unknown time  . aspirin EC 81 MG tablet Take 81 mg by mouth daily.   11/12/2015 at Unknown time  . beta carotene w/minerals (OCUVITE) tablet Take 1 tablet by mouth 2 (two) times daily.   11/12/2015 at Unknown time  . bumetanide (BUMEX) 2 MG tablet Take 1 tablet (2 mg total) by mouth 2 (two) times daily as needed. Take 1 tab daily (Patient taking  differently: Take 2 mg by mouth 2 (two) times daily. Take 1 tab daily) 60 tablet 11 11/13/2015 at Unknown time  . calcium carbonate (OS-CAL) 600 MG TABS tablet Take 600 mg by mouth daily with breakfast.   11/12/2015 at Unknown time  . chlorhexidine (PERIDEX) 0.12 % solution by Mouth Rinse route 2 (two) times daily. Rinse and spit 2 times a day for 30 seconds.  12 11/12/2015 at Unknown time  . citalopram (CELEXA) 20 MG tablet Take 20 mg by mouth daily with lunch.    11/12/2015 at Unknown time  . diphenhydramine-acetaminophen (TYLENOL PM) 25-500 MG TABS Take 1 tablet by mouth at bedtime as needed (for sleep.).    11/12/2015 at Unknown time  . doxazosin (CARDURA) 4 MG tablet Take 4 mg by mouth at bedtime.    11/12/2015 at Unknown time  . esomeprazole (NEXIUM) 40 MG capsule Take 40 mg by mouth daily before breakfast.   11/13/2015 at Unknown time  . ezetimibe (ZETIA) 10 MG tablet Take 10 mg by mouth at bedtime.    11/12/2015 at Unknown time  . fluticasone (FLONASE) 50 MCG/ACT nasal spray Place 1-2 sprays into both nostrils at bedtime.    11/12/2015 at Unknown time  . gabapentin (NEURONTIN) 100 MG capsule Take 100 mg by mouth 2 (two) times daily.    11/13/2015 at Unknown time  . HYDROcodone-acetaminophen (NORCO/VICODIN) 5-325 MG tablet Take 1 tablet by mouth See admin instructions. Take 1 tablet by mouth 2 times a day. Take 1 tablet by mouth during the day as needed for pain.   11/13/2015 at Unknown time  . metoCLOPramide (REGLAN) 5 MG tablet Take 5 mg by mouth 2 (two) times daily. Take before breakfast and supper.   11/13/2015 at Unknown time  . mometasone-formoterol (DULERA) 100-5 MCG/ACT AERO Inhale 2 puffs into the lungs 2 (two) times daily.    11/13/2015 at Unknown time  . Multiple Vitamin (MULTIVITAMIN) tablet Take 1 tablet by mouth every other day.    11/13/2015 at Unknown time  . mupirocin ointment (BACTROBAN) 2 % Apply 1 application topically daily as needed (for sore on leg.).    prn  . nebivolol (BYSTOLIC) 5  MG tablet Take 1 tablet (5 mg total) by mouth daily. 30 tablet 0 11/13/2015 at Unknown time  . nitroGLYCERIN (NITROSTAT) 0.4 MG SL tablet PLACE 1 TABLET (0.4 MG TOTAL) UNDER THE TONGUE EVERY 5 (FIVE) MINUTES AS NEEDED FOR CHEST PAIN. 25 tablet 3 Past Week at Unknown time  . potassium chloride (K-DUR,KLOR-CON) 10 MEQ tablet Take 0.5 tablets (5 mEq total) by mouth 2 (two) times daily as needed. Patient takes this when she takes Bumex. (Patient taking differently: Take 5 mEq by mouth 2 (two) times daily. Patient takes this when she takes Bumex.)  60 tablet 3 11/13/2015 at Unknown time  . promethazine (PHENERGAN) 25 MG tablet Take 25 mg by mouth every 8 (eight) hours as needed. For nausea and vomiting.  0 prn  . SPIRIVA RESPIMAT 1.25 MCG/ACT AERS Inhale 1 puff into the lungs daily.  5 11/13/2015 at Unknown time  . spironolactone (ALDACTONE) 50 MG tablet Take 25 mg by mouth every Monday, Wednesday, and Friday. Takes 1 tablet Mon. Wednesday and Friday.   11/09/2015  . arformoterol (BROVANA) 15 MCG/2ML NEBU Take 2 mLs (15 mcg total) by nebulization 2 (two) times daily. 120 mL 4 Taking  . chlorthalidone (HYGROTON) 25 MG tablet Take 1 tablet (25 mg total) by mouth daily as needed. 30 tablet 6 Taking  . tiotropium (SPIRIVA) 18 MCG inhalation capsule Place 1 capsule (18 mcg total) into inhaler and inhale daily. 90 capsule 0    Assessment: Pharmacy consulted to dose meropenem and aztreonam in this 82 year old female admitted with CAP, multiple drug allergies. Aztreonam d/c.   Plan:  Will change meropenem dosing to 1 g iv q 12 hours based on renal function.   Pharmacy will continue to follow renal function and culture results.    Catherine Holmes D 11/14/2015,1:21 PM

## 2015-11-14 NOTE — Progress Notes (Signed)
Initial Nutrition Assessment   INTERVENTION:   Meals and Snacks: Cater to patient preferences, CNA assisting with pt at breakfast as pt c/o hand tremors Medical Food Supplement Therapy: will recommend Ensure Enlive po BID, each supplement provides 350 kcal and 20 grams of protein Coordination of Care: if pt at risk for aspiration, recommend SLP evaulation.   NUTRITION DIAGNOSIS:   Increased nutrient needs related to wound healing as evidenced by estimated needs.  GOAL:   Patient will meet greater than or equal to 90% of their needs  MONITOR:    (Energy Intake, electrolyte and renal Profile, Skin, Anthropometrics, Digestive System)  REASON FOR ASSESSMENT:    (Pressure Ulcer)    ASSESSMENT:   Pt admitted with COPD exacerbation, with intermittent bipap, currently on 5L [place].  Past Medical History  Diagnosis Date  . Essential hypertension   . Diabetes mellitus type II, controlled (HCC)   . COPD (chronic obstructive pulmonary disease) (HCC)   . Osteoporosis   . Carotid arterial disease (HCC)     a. 2012 Carotid dopplers: 40-59% R ICA, 60-79% L ICA  . Tic douloureux   . Arthralgia   . Sjoegren syndrome (HCC)   . TIA (transient ischemic attack)   . Emphysema   . MVP (mitral valve prolapse)   . Chronic diastolic CHF (congestive heart failure) (HCC)     a. 08/2011 Echo: EF 65-70%, mild LVH, mod dil LA, mildly dil RA, mild MR, mild-mod AoV Sclerosis w/o stenosis, mod-sev elev PA pressures, mild TR.  Marland Kitchen Acid reflux   . Diverticulitis   . Anemia   . History of pneumonia   . Syncope and collapse   . Anemia   . Hip fracture, left (HCC)   . Hyperlipidemia   . Hiatal hernia   . Chest pain     a. 08/2011 MV: EF 74%, no ischemia.  . Noncompliance     a. h/o not taking bumex as Rx.  Marland Kitchen PAF (paroxysmal atrial fibrillation) (HCC)     a. CHA2DS2VASc = 8 ->No OAC 2/2 falls.     Diet Order:  Diet Heart Room service appropriate?: Yes; Fluid consistency:: Thin    Current  Nutrition: Pt had eaten 50% of blueberry muffin, 50% of coffee and bites of eggs before falling asleep this am at breakfast.   Food/Nutrition-Related History: Unclear if pt had a good appetite PTA or not as pt easily distracted this am, but did report normally eating grits and eggs for breakfast. Per MST no decrease in appetite PTA. Per MD note, RD does note that pt has been refined to recliner recently PTA. Also note pt reports having had a barium swallow study lats Tuesday to which pt reports sometimes having difficulty swallowing because of her 'esophagus.'   Scheduled Medications:  . arformoterol  15 mcg Nebulization BID  . aspirin EC  81 mg Oral Daily  . beta carotene w/minerals  1 tablet Oral BID  . budesonide (PULMICORT) nebulizer solution  0.5 mg Nebulization BID  . bumetanide (BUMEX) IV  1 mg Intravenous Once  . bumetanide (BUMEX) IV  1 mg Intravenous Q12H  . calcium carbonate  1,500 mg Oral Q breakfast  . chlorhexidine  10 mL Mouth Rinse BID  . chlorthalidone  25 mg Oral Daily  . citalopram  20 mg Oral Q lunch  . docusate sodium  100 mg Oral BID  . doxazosin  4 mg Oral QHS  . ezetimibe  10 mg Oral QHS  . feeding supplement (ENSURE  ENLIVE)  237 mL Oral BID BM  . fluticasone  1-2 spray Each Nare QHS  . gabapentin  100 mg Oral BID  . heparin  5,000 Units Subcutaneous 3 times per day  . insulin aspart  0-5 Units Subcutaneous QHS  . insulin aspart  0-9 Units Subcutaneous TID WC  . ipratropium-albuterol  3 mL Nebulization Once  . ipratropium-albuterol  3 mL Nebulization Once  . ipratropium-albuterol  3 mL Nebulization Q6H  . meropenem (MERREM) IV  1 g Intravenous 3 times per day  . methylPREDNISolone (SOLU-MEDROL) injection  40 mg Intravenous Q24H  . metoCLOPramide  5 mg Oral BID  . multivitamin with minerals  1 tablet Oral QODAY  . nebivolol  5 mg Oral Daily  . pantoprazole  40 mg Oral Daily  . potassium chloride  10 mEq Intravenous Q1 Hr x 4  . spironolactone  25 mg Oral Q  M,W,F  . tiotropium  18 mcg Inhalation Daily     Electrolyte/Renal Profile and Glucose Profile:   Recent Labs Lab 11/10/15 1610 11/13/15 1809 11/14/15 0448  NA 133* 136 137  K 4.2 3.5 3.2*  CL 87* 86* 87*  CO2 30* 37* 39*  BUN CREATININE 0.61 0.57 0.56  CALCIUM 9.1 8.4* 8.1*  GLUCOSE 135* 182* 241*   Protein Profile:  Recent Labs Lab 11/13/15 1809  ALBUMIN 3.7    Gastrointestinal Profile: Last BM:   Nutrition-Focused Physical Exam Findings:  Unable to complete Nutrition-Focused physical exam at this time.    Weight Change: Pt reports weight of 150lbs recently but she is 'supposed to weigh' 145lbs. Pt reports 155lbs when she has fluid. Pt current weight of 155lbs. Per [facility] weight encounters pt with gradual weight loss since August 2016 of 5%   Skin:   (Stage II buttock pressure ulcer)   Height:   Ht Readings from Last 1 Encounters:  11/13/15  (1.626 m)    Weight:   Wt Readings from Last 1 Encounters:  11/13/15 155 lb (70.308 kg)    Wt Readings from Last 10 Encounters:  11/13/15 155 lb (70.308 kg)  11/10/15 162 lb (73.483 kg)  10/03/15 160 lb 11.5 oz (72.9 kg)  09/29/15 165 lb 8 oz (75.07 kg)  09/05/15 160 lb 6 oz (72.746 kg)  09/02/15 144 lb 6.4 oz (65.5 kg)  08/19/15 170 lb (77.111 kg)  07/29/15 160 lb 6.4 oz (72.757 kg)  07/27/15 153 lb (69.4 kg)  06/16/15 163 lb 9.3 oz (74.2 kg)    BMI:  Body mass index is 26.59 kg/(m^2).  Estimated Nutritional Needs:   Kcal:  BEE: 1130kcals, TEE: (IF 1.1-1.3)(AF 1.2) 1492-1762kcals  Protein:  84-100g protein (1.2-1.5g/kg)  Fluid:  1750-2155mL of fluid (25-73mL/kg)  EDUCATION NEEDS:   No education needs identified at this time   MODERATE Care Level  Leda Quail, RD, LDN Pager [phone] Weekend/On-Call Pager [phone]

## 2015-11-14 NOTE — Progress Notes (Signed)
PULMONARY / CRITICAL CARE MEDICINE   Name: Catherine Holmes MRN: [id number] DOB: 09/22/1928    ADMISSION DATE:  11/13/2015 CONSULTATION DATE:  11/14/15  REFERRING MD :  Dr. Elisabeth Pigeon   CHIEF COMPLAINT:    SOB Reason for consult - AECOPD requiring bipap   HISTORY OF PRESENT ILLNESS    Patient is a pleasant 82 year old female past medical history of COPD, hypertension, diabetes, seen in  consultation for acute exacerbation of COPD. History obtained from patient. She states that she had a barium swallow study done last Tuesday and since then has been increasing shortness of breath, intermittent chills and feeling "hot," along with mildly productive sputum, greenish in nature. She had a chest x-ray done in the ER, did not show any acute infiltrate. However, due to the shortness of breath and requiring BiPAP with 2 L of oxygen to keep her sats above 90% she was admitted to the hospital for further inpatient care. This morning she is weaned off the BiPAP, only requiring 2-3 L of oxygen at the time of my examination. She admits to shortness of breath, productive cough, fatigue, right leg swelling.  SIGNIFICANT EVENTS     PAST MEDICAL HISTORY    :  Past Medical History  Diagnosis Date  . Essential hypertension   . Diabetes mellitus type II, controlled (HCC)   . COPD (chronic obstructive pulmonary disease) (HCC)   . Osteoporosis   . Carotid arterial disease (HCC)     a. 2012 Carotid dopplers: 40-59% R ICA, 60-79% L ICA  . Tic douloureux   . Arthralgia   . Sjoegren syndrome (HCC)   . TIA (transient ischemic attack)   . Emphysema   . MVP (mitral valve prolapse)   . Chronic diastolic CHF (congestive heart failure) (HCC)     a. 08/2011 Echo: EF 65-70%, mild LVH, mod dil LA, mildly dil RA, mild MR, mild-mod AoV Sclerosis w/o stenosis, mod-sev elev PA pressures, mild TR.  Marland Kitchen Acid reflux   . Diverticulitis   . Anemia   . History of pneumonia   . Syncope and collapse   . Anemia   .  Hip fracture, left (HCC)   . Hyperlipidemia   . Hiatal hernia   . Chest pain     a. 08/2011 MV: EF 74%, no ischemia.  . Noncompliance     a. h/o not taking bumex as Rx.  Marland Kitchen PAF (paroxysmal atrial fibrillation) (HCC)     a. CHA2DS2VASc = 8 ->No OAC 2/2 falls.   Past Surgical History  Procedure Laterality Date  . Cholecystectomy  1965  . Abdominal hysterectomy  1964  . Cataract extraction Bilateral   . Hip fracture surgery  2014    left hip   Prior to Admission medications   Medication Sig Start Date End Date Taking? Authorizing Provider  albuterol (PROAIR HFA) 108 (90 BASE) MCG/ACT inhaler Inhale 2 puffs into the lungs every 6 (six) hours as needed. 01/24/12  Yes Antonieta Iba, MD  albuterol (PROVENTIL) (2.5 MG/3ML) 0.083% nebulizer solution Take 2.5 mg by nebulization every 6 (six) hours as needed for wheezing or shortness of breath.    Yes Historical Provider, MD  aspirin EC 81 MG tablet Take 81 mg by mouth daily.   Yes Historical Provider, MD  beta carotene w/minerals (OCUVITE) tablet Take 1 tablet by mouth 2 (two) times daily.   Yes Historical Provider, MD  bumetanide (BUMEX) 2 MG tablet Take 1 tablet (2 mg total) by mouth 2 (two) times  daily as needed. Take 1 tab daily Patient taking differently: Take 2 mg by mouth 2 (two) times daily. Take 1 tab daily 08/19/15  Yes Antonieta Iba, MD  calcium carbonate (OS-CAL) 600 MG TABS tablet Take 600 mg by mouth daily with breakfast.   Yes Historical Provider, MD  chlorhexidine (PERIDEX) 0.12 % solution by Mouth Rinse route 2 (two) times daily. Rinse and spit 2 times a day for 30 seconds. 10/09/15  Yes Historical Provider, MD  citalopram (CELEXA) 20 MG tablet Take 20 mg by mouth daily with lunch.    Yes Historical Provider, MD  diphenhydramine-acetaminophen (TYLENOL PM) 25-500 MG TABS Take 1 tablet by mouth at bedtime as needed (for sleep.).    Yes Historical Provider, MD  doxazosin (CARDURA) 4 MG tablet Take 4 mg by mouth at bedtime.   05/31/15  Yes Antonieta Iba, MD  esomeprazole (NEXIUM) 40 MG capsule Take 40 mg by mouth daily before breakfast.   Yes Historical Provider, MD  ezetimibe (ZETIA) 10 MG tablet Take 10 mg by mouth at bedtime.    Yes Historical Provider, MD  fluticasone (FLONASE) 50 MCG/ACT nasal spray Place 1-2 sprays into both nostrils at bedtime.    Yes Historical Provider, MD  gabapentin (NEURONTIN) 100 MG capsule Take 100 mg by mouth 2 (two) times daily.    Yes Historical Provider, MD  HYDROcodone-acetaminophen (NORCO/VICODIN) 5-325 MG tablet Take 1 tablet by mouth See admin instructions. Take 1 tablet by mouth 2 times a day. Take 1 tablet by mouth during the day as needed for pain.   Yes Historical Provider, MD  metoCLOPramide (REGLAN) 5 MG tablet Take 5 mg by mouth 2 (two) times daily. Take before breakfast and supper.   Yes Historical Provider, MD  mometasone-formoterol (DULERA) 100-5 MCG/ACT AERO Inhale 2 puffs into the lungs 2 (two) times daily.    Yes Historical Provider, MD  Multiple Vitamin (MULTIVITAMIN) tablet Take 1 tablet by mouth every other day.    Yes Historical Provider, MD  mupirocin ointment (BACTROBAN) 2 % Apply 1 application topically daily as needed (for sore on leg.).    Yes Historical Provider, MD  nebivolol (BYSTOLIC) 5 MG tablet Take 1 tablet (5 mg total) by mouth daily. 04/03/15  Yes Sital Mody, MD  nitroGLYCERIN (NITROSTAT) 0.4 MG SL tablet PLACE 1 TABLET (0.4 MG TOTAL) UNDER THE TONGUE EVERY 5 (FIVE) MINUTES AS NEEDED FOR CHEST PAIN. 10/18/15  Yes Antonieta Iba, MD  potassium chloride (K-DUR,KLOR-CON) 10 MEQ tablet Take 0.5 tablets (5 mEq total) by mouth 2 (two) times daily as needed. Patient takes this when she takes Bumex. Patient taking differently: Take 5 mEq by mouth 2 (two) times daily. Patient takes this when she takes Bumex. 06/09/15  Yes Antonieta Iba, MD  promethazine (PHENERGAN) 25 MG tablet Take 25 mg by mouth every 8 (eight) hours as needed. For nausea and vomiting.  09/21/15  Yes Historical Provider, MD  SPIRIVA RESPIMAT 1.25 MCG/ACT AERS Inhale 1 puff into the lungs daily. 10/18/15  Yes Historical Provider, MD  spironolactone (ALDACTONE) 50 MG tablet Take 25 mg by mouth every Monday, Wednesday, and Friday. Takes 1 tablet Mon. Wednesday and Friday.   Yes Historical Provider, MD  arformoterol (BROVANA) 15 MCG/2ML NEBU Take 2 mLs (15 mcg total) by nebulization 2 (two) times daily. 12/26/11   Lupita Leash, MD  chlorthalidone (HYGROTON) 25 MG tablet Take 1 tablet (25 mg total) by mouth daily as needed. 09/29/15   Antonieta Iba, MD  tiotropium (SPIRIVA) 18 MCG inhalation capsule Place 1 capsule (18 mcg total) into inhaler and inhale daily. 01/24/12   Antonieta Iba, MD   Allergies  Allergen Reactions  . Atorvastatin   . Clindamycin Diarrhea, Other (See Comments) and Nausea Only  . Codeine   . Doxycycline   . Epinephrine Hives and Itching  . Erythromycin   . Fluticasone-Salmeterol   . Lasix [Furosemide] Itching  . Levofloxacin   . Penicillins   . Propoxyphene Hcl   . Rosuvastatin   . Simvastatin   . Singlet [Chlorphen-Pe-Acetaminophen] Other (See Comments)    Reaction: Wheezing  . Sulfamethoxazole-Trimethoprim   . Teriparatide   . Tetracyclines & Related Other (See Comments)    unknown  . Cefaclor Rash and Other (See Comments)    Reaction: Delirious    . Glipizide Hives  . Lisinopril Hives and Itching     FAMILY HISTORY   Family History  Problem Relation Age of Onset  . Heart attack Mother   . Heart attack Father   . Heart attack Sister       SOCIAL HISTORY    reports that she quit smoking about 21 years ago. Her smoking use included Cigarettes. She has a 15 pack-year smoking history. She has never used smokeless tobacco. She reports that she drinks alcohol. She reports that she does not use illicit drugs.  Review of Systems  Constitutional: Positive for fever, chills and malaise/fatigue.  HENT: Negative for hearing loss and  tinnitus.   Eyes: Negative for blurred vision and double vision.  Respiratory: Positive for cough, sputum production, shortness of breath and wheezing.   Cardiovascular: Positive for orthopnea and leg swelling. Negative for chest pain and palpitations.  Gastrointestinal: Negative for heartburn, nausea, vomiting and abdominal pain.  Genitourinary: Negative for dysuria.  Musculoskeletal: Negative for myalgias.  Skin: Negative for itching and rash.  Neurological: Negative for dizziness and headaches.  Endo/Heme/Allergies: Does not bruise/bleed easily.  Psychiatric/Behavioral: Negative for depression.      VITAL SIGNS    Temp:  [97.8 F (36.6 C)-98.8 F (37.1 C)] 97.8 F (36.6 C) (01/23 0800) Pulse Rate:  [72-130] 79 (01/23 0900) Resp:  [18-35] 22 (01/23 0900) BP: (95-133)/(48-98) 133/70 mmHg (01/23 0900) SpO2:  [92 %-100 %] 95 % (01/23 0900) FiO2 (%):  [40 %] 40 % (01/23 0600) Weight:  [155 lb (70.308 kg)] 155 lb (70.308 kg) (01/22 1737) HEMODYNAMICS:   VENTILATOR SETTINGS: Vent Mode:  [-]  FiO2 (%):  [40 %] 40 % INTAKE / OUTPUT:  Intake/Output Summary (Last 24 hours) at 11/14/15 1118 Last data filed at 11/14/15 0650  Gross per 24 hour  Intake    250 ml  Output    350 ml  Net   -100 ml       PHYSICAL EXAM   Physical Exam  Constitutional: She is oriented to person, place, and time. She appears well-developed and well-nourished. No distress.  HENT:  Head: Normocephalic and atraumatic.  Right Ear: External ear normal.  Left Ear: External ear normal.  Mouth/Throat: Oropharynx is clear and moist. No oropharyngeal exudate.  Eyes: Conjunctivae and EOM are normal. Pupils are equal, round, and reactive to light.  Neck: Normal range of motion. Neck supple. No JVD present. No tracheal deviation present. No thyromegaly present.  Cardiovascular: Normal rate, regular rhythm, normal heart sounds and intact distal pulses.  Exam reveals no friction rub.   No murmur  heard. Pulmonary/Chest: Effort normal. No respiratory distress. She has no rales.  Faint  wheezes - expiratory Shallow breath sounds   Abdominal: Soft. Bowel sounds are normal.  Musculoskeletal: Normal range of motion.  Trace right leg edema  Neurological: She is alert and oriented to person, place, and time.  Skin: Skin is warm and dry. No rash noted. She is not diaphoretic. No erythema.  Nursing note and vitals reviewed.      LABS   LABS:  CBC  Recent Labs Lab 11/13/15 1809 11/14/15 0448  WBC 17.9* 12.5*  HGB 10.3* 9.4*  HCT 31.9* 29.2*  PLT 377 339   Coag's No results for input(s): APTT, INR in the last 168 hours. BMET  Recent Labs Lab 11/10/15 1610 11/13/15 1809 11/14/15 0448  NA 133* 136 137  K 4.2 3.5 3.2*  CL 87* 86* 87*  CO2 30* 37* 39*  BUN 12 15 13   CREATININE 0.61 0.57 0.56  GLUCOSE 135* 182* 241*   Electrolytes  Recent Labs Lab 11/10/15 1610 11/13/15 1809 11/14/15 0448  CALCIUM 9.1 8.4* 8.1*   Sepsis Markers No results for input(s): LATICACIDVEN, PROCALCITON, O2SATVEN in the last 168 hours. ABG No results for input(s): PHART, PCO2ART, PO2ART in the last 168 hours. Liver Enzymes  Recent Labs Lab 11/13/15 1809  AST 20  ALT 15  ALKPHOS 99  BILITOT 0.9  ALBUMIN 3.7   Cardiac Enzymes  Recent Labs Lab 11/13/15 1809  TROPONINI 0.05*   Glucose  Recent Labs Lab 11/14/15 0807  GLUCAP 226*     Recent Results (from the past 240 hour(s))  MRSA PCR Screening     Status: None   Collection Time: 11/13/15  5:28 PM  Result Value Ref Range Status   MRSA by PCR NEGATIVE NEGATIVE Final    Comment:        The GeneXpert MRSA Assay (FDA approved for NASAL specimens only), is one component of a comprehensive MRSA colonization surveillance program. It is not intended to diagnose MRSA infection nor to guide or monitor treatment for MRSA infections.      Current facility-administered medications:  .  alum & mag  hydroxide-simeth (MAALOX/MYLANTA) 200-200-20 MG/5ML suspension 30 mL, 30 mL, Oral, Q6H PRN, Katha Hamming, MD .  arformoterol (BROVANA) nebulizer solution 15 mcg, 15 mcg, Nebulization, BID, Katha Hamming, MD, 15 mcg at 11/14/15 0854 .  aspirin EC tablet 81 mg, 81 mg, Oral, Daily, Katha Hamming, MD .  aztreonam (AZACTAM) 2 g in dextrose 5 % 50 mL IVPB, 2 g, Intravenous, 3 times per day, Katha Hamming, MD, 2 g at 11/14/15 0100 .  beta carotene w/minerals (OCUVITE) tablet 1 tablet, 1 tablet, Oral, BID, Katha Hamming, MD .  bisacodyl (DULCOLAX) EC tablet 5 mg, 5 mg, Oral, Daily PRN, Katha Hamming, MD .  bumetanide (BUMEX) injection 1 mg, 1 mg, Intravenous, Once, Arnaldo Natal, MD, 1 mg at 11/13/15 2202 .  bumetanide (BUMEX) injection 1 mg, 1 mg, Intravenous, Q12H, Katha Hamming, MD, 1 mg at 11/14/15 1059 .  calcium carbonate (TUMS - dosed in mg elemental calcium) chewable tablet 1,500 mg, 1,500 mg, Oral, Q breakfast, Katha Hamming, MD, 1,500 mg at 11/14/15 0859 .  chlorhexidine (PERIDEX) 0.12 % solution 10 mL, 10 mL, Mouth Rinse, BID, Katha Hamming, MD .  chlorthalidone (HYGROTON) tablet 25 mg, 25 mg, Oral, Daily, Katha Hamming, MD .  citalopram (CELEXA) tablet 20 mg, 20 mg, Oral, Q lunch, Katha Hamming, MD .  docusate sodium (COLACE) capsule 100 mg, 100 mg, Oral, BID, Katha Hamming, MD .  doxazosin (CARDURA) tablet 4 mg, 4  mg, Oral, QHS, Katha Hamming, MD .  ezetimibe (ZETIA) tablet 10 mg, 10 mg, Oral, QHS, Katha Hamming, MD .  fluticasone (FLONASE) 50 MCG/ACT nasal spray 1-2 spray, 1-2 spray, Each Nare, QHS, Katha Hamming, MD .  gabapentin (NEURONTIN) capsule 100 mg, 100 mg, Oral, BID, Katha Hamming, MD, 100 mg at 11/14/15 1059 .  heparin injection 5,000 Units, 5,000 Units, Subcutaneous, 3 times per day, Katha Hamming, MD, 5,000 Units at 11/14/15 0650 .  ipratropium-albuterol (DUONEB) 0.5-2.5 (3)  MG/3ML nebulizer solution 3 mL, 3 mL, Nebulization, Once, Arnaldo Natal, MD, Stopped at 11/13/15 1755 .  ipratropium-albuterol (DUONEB) 0.5-2.5 (3) MG/3ML nebulizer solution 3 mL, 3 mL, Nebulization, Once, Arnaldo Natal, MD, Stopped at 11/13/15 1755 .  ipratropium-albuterol (DUONEB) 0.5-2.5 (3) MG/3ML nebulizer solution 3 mL, 3 mL, Nebulization, Q6H, Katha Hamming, MD, 3 mL at 11/14/15 0853 .  meropenem (MERREM) 1 g in sodium chloride 0.9 % 100 mL IVPB, 1 g, Intravenous, 3 times per day, Arnaldo Natal, MD, Last Rate: 200 mL/hr at 11/14/15 0650, 1 g at 11/14/15 0650 .  methylPREDNISolone sodium succinate (SOLU-MEDROL) 125 mg/2 mL injection 60 mg, 60 mg, Intravenous, Q24H, Katha Hamming, MD, 60 mg at 11/14/15 0100 .  metoCLOPramide (REGLAN) tablet 5 mg, 5 mg, Oral, BID, Katha Hamming, MD, 5 mg at 11/14/15 1059 .  multivitamin with minerals tablet 1 tablet, 1 tablet, Oral, QODAY, Katha Hamming, MD .  nebivolol (BYSTOLIC) tablet 5 mg, 5 mg, Oral, Daily, Katha Hamming, MD, 5 mg at 11/14/15 1059 .  ondansetron (ZOFRAN) tablet 4 mg, 4 mg, Oral, Q6H PRN **OR** ondansetron (ZOFRAN) injection 4 mg, 4 mg, Intravenous, Q6H PRN, Katha Hamming, MD .  pantoprazole (PROTONIX) EC tablet 40 mg, 40 mg, Oral, Daily, Katha Hamming, MD, 40 mg at 11/14/15 1059 .  spironolactone (ALDACTONE) tablet 25 mg, 25 mg, Oral, Q M,W,F, Katha Hamming, MD, 25 mg at 11/14/15 1059 .  tiotropium (SPIRIVA) inhalation capsule 18 mcg, 18 mcg, Inhalation, Daily, Katha Hamming, MD, 18 mcg at 11/14/15 0814 .  traZODone (DESYREL) tablet 25 mg, 25 mg, Oral, QHS PRN, Katha Hamming, MD  Facility-Administered Medications Ordered in Other Encounters:  .  0.9 %  sodium chloride infusion, , Intravenous, Once, Rosey Bath, MD .  alteplase (CATHFLO ACTIVASE) injection 2 mg, 2 mg, Intracatheter, Once PRN, Rosey Bath, MD .  ferumoxytol (FERAHEME) 510 mg in sodium chloride 0.9 %  100 mL IVPB, 510 mg, Intravenous, Once, Rosey Bath, MD .  heparin lock flush 100 unit/mL, 500 Units, Intracatheter, Once PRN, Rosey Bath, MD .  heparin lock flush 100 unit/mL, 250 Units, Intracatheter, Once PRN, Rosey Bath, MD .  sodium chloride 0.9 % injection 10 mL, 10 mL, Intracatheter, PRN, Rosey Bath, MD .  sodium chloride 0.9 % injection 3 mL, 3 mL, Intravenous, Once PRN, Rosey Bath, MD  IMAGING    Dg Chest Portable 1 View  11/13/2015  CLINICAL DATA:  82 year old female with shortness of Breath. Chills for 1 hour. COPD with home oxygen. Initial encounter. EXAM: PORTABLE CHEST 1 VIEW COMPARISON:  10/11/2015 and earlier. FINDINGS: Portable AP upright view at 1741 hours. Stable to mildly lower lung volumes. Stable cardiomegaly and mediastinal contours. Lung markings appear stable with no convincing acute pulmonary opacity, pneumothorax or pleural effusion. Visualized tracheal air column is within normal limits. IMPRESSION: Stable.  No acute cardiopulmonary abnormality identified. Electronically Signed   By: Odessa Fleming M.D.   On: 11/13/2015 17:53  Indwelling Urinary Catheter continued, requirement due to   Reason to continue Indwelling Urinary Catheter for strict Intake/Output monitoring for hemodynamic instability   Central Line continued, requirement due to   Reason to continue [employer] Monitoring of central venous pressure or other hemodynamic parameters   Ventilator continued, requirement due to, resp failure    Ventilator Sedation RASS 0 to -2   Cultures: BCx2  UC  Sputum 1/23>>  Antibiotics: Meropenem 1/22>>  Lines:   ASSESSMENT/PLAN  82 year old female past medical history of COPD, diabetes, hypertension, admitted for acute COPD exacerbation.  AECOPD - Prednisone 40 mg daily 5 days -Continue with meropenem, stop aztreonam -Continue with the scheduled nebulizers -Sputum culture -Maintain O2 saturations greater than  88% -Chest physiotherapy, Acapella -BiPAP daily at bedtime 3 nights, minimal 4-6 hours per night, when necessary BiPAP during the day -When necessary nebulizers -Patient states she wears 2 L of O2 as needed at home -Primary pulmonologist Dr. Romeo Apple -Chest x-ray with no acute infiltrates, however does have productive greenish sputum.  Chronic diastolic failure -Continue with Bumex and Aldactone -Monitor I's and O's -Monitor blood pressure  Hypertension -Monitor hemodynamics    I have personally obtained a history, examined the patient, evaluated laboratory and imaging results, formulated the assessment and plan and placed orders. Pulmonary Time devoted to patient care services described in this note is 50 minutes.    Stephanie Acre, MD [facility] Pager [phone] (please enter 7-digits) On Call Pager - [phone] (please enter 7-digits)     11/14/2015, 11:18 AM  Note: This note was prepared with Dragon dictation along with smaller phrase technology. Any transcriptional errors that result from this process are unintentional.

## 2015-11-15 ENCOUNTER — Inpatient Hospital Stay: Payer: Medicare Other

## 2015-11-15 DIAGNOSIS — J9622 Acute and chronic respiratory failure with hypercapnia: Secondary | ICD-10-CM

## 2015-11-15 LAB — BASIC METABOLIC PANEL
ANION GAP: 6 (ref 5–15)
Anion gap: 5 (ref 5–15)
BUN: 18 mg/dL (ref 6–20)
BUN: 16 mg/dL (ref 6–20)
CALCIUM: 8.2 mg/dL — AB (ref 8.9–10.3)
CALCIUM: 8.4 mg/dL — AB (ref 8.9–10.3)
CHLORIDE: 86 mmol/L — AB (ref 101–111)
CO2: 39 mmol/L — ABNORMAL HIGH (ref 22–32)
CO2: 40 mmol/L — AB (ref 22–32)
Chloride: 88 mmol/L — ABNORMAL LOW (ref 101–111)
Creatinine, Ser: 0.45 mg/dL (ref 0.44–1.00)
Creatinine, Ser: 0.7 mg/dL (ref 0.44–1.00)
GFR calc Af Amer: >60 mL/min (ref >60)
GFR calc Af Amer: >60 mL/min (ref >60)
GLUCOSE: 162 mg/dL — AB (ref 65–99)
GLUCOSE: 185 mg/dL — AB (ref 65–99)
Potassium: 4.1 mmol/L (ref 3.5–5.1)
Potassium: 4.4 mmol/L (ref 3.5–5.1)
Sodium: 133 mmol/L — ABNORMAL LOW (ref 135–145)
Sodium: 131 mmol/L — ABNORMAL LOW (ref 135–145)

## 2015-11-15 LAB — CBC
HCT: 28.2 % — ABNORMAL LOW (ref 35.0–47.0)
HEMOGLOBIN: 8.9 g/dL — AB (ref 12.0–16.0)
MCH: 26.7 pg (ref 26.0–34.0)
MCHC: 31.7 g/dL — ABNORMAL LOW (ref 32.0–36.0)
MCV: 84.5 fL (ref 80.0–100.0)
PLATELETS: 345 10*3/uL (ref 150–440)
RBC: 3.34 MIL/uL — AB (ref 3.80–5.20)
RDW: 17.4 % — ABNORMAL HIGH (ref 11.5–14.5)
WBC: 12.5 10*3/uL — AB (ref 3.6–11.0)

## 2015-11-15 LAB — BLOOD GAS, ARTERIAL
Acid-Base Excess: 19.4 mmol/L — ABNORMAL HIGH (ref 0.0–3.0)
Allens test (pass/fail): POSITIVE — AB
BICARBONATE: 45.4 meq/L — AB (ref 21.0–28.0)
EXPIRATORY PAP: 6
FIO2: 40
Inspiratory PAP: 14
O2 Saturation: 96.1 %
PH ART: 7.48 — AB (ref 7.350–7.450)
PO2 ART: 77 mmHg — AB (ref 83.0–108.0)
Patient temperature: 37
pCO2 arterial: 61 mmHg — ABNORMAL HIGH (ref 32.0–48.0)

## 2015-11-15 LAB — GLUCOSE, CAPILLARY
GLUCOSE-CAPILLARY: 167 mg/dL — AB (ref 65–99)
GLUCOSE-CAPILLARY: 157 mg/dL — AB (ref 65–99)
GLUCOSE-CAPILLARY: 179 mg/dL — AB (ref 65–99)
GLUCOSE-CAPILLARY: 212 mg/dL — AB (ref 65–99)

## 2015-11-15 MED ORDER — PANTOPRAZOLE SODIUM 40 MG PO TBEC
40.0000 mg | DELAYED_RELEASE_TABLET | Freq: Two times a day (BID) | ORAL | Status: DC
  Administered 2015-11-15 – 2015-11-21 (×12): 40 mg via ORAL
  Filled 2015-11-15 (×12): qty 1

## 2015-11-15 MED ORDER — METHYLPREDNISOLONE SODIUM SUCC 40 MG IJ SOLR
40.0000 mg | Freq: Four times a day (QID) | INTRAMUSCULAR | Status: DC
  Administered 2015-11-15 (×2): 40 mg via INTRAVENOUS
  Filled 2015-11-15 (×2): qty 1

## 2015-11-15 MED ORDER — PNEUMOCOCCAL VAC POLYVALENT 25 MCG/0.5ML IJ INJ
0.5000 mL | INJECTION | INTRAMUSCULAR | Status: AC
  Administered 2015-11-16: 0.5 mL via INTRAMUSCULAR
  Filled 2015-11-15: qty 0.5

## 2015-11-15 MED ORDER — SODIUM CHLORIDE 0.9 % IV SOLN
1.0000 g | Freq: Two times a day (BID) | INTRAVENOUS | Status: DC
  Filled 2015-11-15: qty 1

## 2015-11-15 MED ORDER — METHYLPREDNISOLONE SODIUM SUCC 40 MG IJ SOLR
30.0000 mg | Freq: Every day | INTRAMUSCULAR | Status: DC
  Administered 2015-11-16: 30 mg via INTRAVENOUS
  Filled 2015-11-15: qty 1

## 2015-11-15 MED ORDER — SODIUM CHLORIDE 0.9 % IV SOLN
500.0000 mg | Freq: Three times a day (TID) | INTRAVENOUS | Status: DC
  Administered 2015-11-15 – 2015-11-16 (×4): 500 mg via INTRAVENOUS
  Filled 2015-11-15 (×6): qty 0.5

## 2015-11-15 MED ORDER — METHYLPREDNISOLONE SODIUM SUCC 40 MG IJ SOLR: 40.0000 mg | Freq: Every day | INTRAMUSCULAR | Status: DC

## 2015-11-15 NOTE — Progress Notes (Signed)
[facility] [place] [facility]   PATIENT NAME: Catherine Holmes    MR#:  [id number]  DATE OF BIRTH:  [birth year]  SUBJECTIVE:  CHIEF COMPLAINT:   Chief Complaint  Patient presents with  . Shortness of Breath     Came with respi distress, there was requirement of bipap, but then was on nasal canul aoxygen- transferred to floor- and seen at 8 am today on floor with respi distress and unable to come off bipap.   REVIEW OF SYSTEMS:  CONSTITUTIONAL: No fever, fatigue or weakness.  EYES: No blurred or double vision.  EARS, NOSE, AND THROAT: No tinnitus or ear pain.  RESPIRATORY: positive for cough, shortness of breath, wheezing but no hemoptysis.  CARDIOVASCULAR: No chest pain, orthopnea, edema.  GASTROINTESTINAL: No nausea, vomiting, diarrhea or abdominal pain.  GENITOURINARY: No dysuria, hematuria.  ENDOCRINE: No polyuria, nocturia,  HEMATOLOGY: No anemia, easy bruising or bleeding SKIN: No rash or lesion. MUSCULOSKELETAL: No joint pain or arthritis.   NEUROLOGIC: No tingling, numbness, weakness.  PSYCHIATRY: No anxiety or depression.   ROS  DRUG ALLERGIES:   Allergies  Allergen Reactions  . Atorvastatin   . Clindamycin Diarrhea, Other (See Comments) and Nausea Only  . Codeine   . Doxycycline   . Epinephrine Hives and Itching  . Erythromycin   . Fluticasone-Salmeterol   . Lasix [Furosemide] Itching  . Levofloxacin   . Penicillins   . Propoxyphene Hcl   . Rosuvastatin   . Simvastatin   . Singlet [Chlorphen-Pe-Acetaminophen] Other (See Comments)    Reaction: Wheezing  . Sulfamethoxazole-Trimethoprim   . Teriparatide   . Tetracyclines & Related Other (See Comments)    unknown  . Cefaclor Rash and Other (See Comments)    Reaction: Delirious    . Glipizide Hives  . Lisinopril Hives and Itching    VITALS:  Blood pressure 148/83, pulse 89, temperature 97.9 F (36.6 C), temperature source Oral, resp. rate 21, height  (1.626 m), weight  82.736 kg (182 lb 6.4 oz), SpO2 99 %.  PHYSICAL EXAMINATION:  GENERAL:  82 y.o.-year-old patient lying in the bed with acute distress.appears anxious.  EYES: Pupils equal, round, reactive to light and accommodation. No scleral icterus. Extraocular muscles intact.  HEENT: Head atraumatic, normocephalic. Oropharynx and nasopharynx clear.  NECK:  Supple, no jugular venous distention. No thyroid enlargement, no tenderness.  LUNGS: Normal breath sounds bilaterally, mild wheezing, no rales,rhonchi or crepitation. On bipap. CARDIOVASCULAR: S1, S2 normal. No murmurs, rubs, or gallops.  ABDOMEN: Soft, nontender, nondistended. Bowel sounds present. No organomegaly or mass.  EXTREMITIES: No pedal edema, cyanosis, or clubbing.  NEUROLOGIC: Cranial nerves II through XII are intact. Muscle strength 5/5 in all extremities. Sensation intact. Gait not checked.  PSYCHIATRIC: The patient is alert and oriented x 3. Anxious now. SKIN: No obvious rash, lesion, or ulcer.   Physical Exam LABORATORY PANEL:   CBC  Recent Labs Lab 11/15/15 0907  WBC 12.5*  HGB 8.9*  HCT 28.2*  PLT 345   ------------------------------------------------------------------------------------------------------------------  Chemistries   Recent Labs Lab 11/13/15 1809  11/15/15 1442  NA 136  < > 131*  K 3.5  < > 4.4  CL 86*  < > 86*  CO2 37*  < > 40*  GLUCOSE 182*  < > 185*  BUN 15  < > 16  CREATININE 0.57  < > 0.70  CALCIUM 8.4*  < > 8.4*  AST 20  --   --   ALT 15  --   --  ALKPHOS 99  --   --   BILITOT 0.9  --   --   < > = values in this interval not displayed. ------------------------------------------------------------------------------------------------------------------  Cardiac Enzymes  Recent Labs Lab 11/13/15 1809  TROPONINI 0.05*   ------------------------------------------------------------------------------------------------------------------  RADIOLOGY:  Dg Chest Port 1 View  11/15/2015   CLINICAL DATA:  Respiratory failure EXAM: PORTABLE CHEST 1 VIEW COMPARISON:  11/13/2015 FINDINGS: Cardiomediastinal silhouette is stable. Mild hyperinflation and mild emphysematous changes especially upper lobes again noted. Stable atelectasis or scarring in right lower lobe laterally. No segmental infiltrate or pulmonary edema. IMPRESSION: Mild hyperinflation and mild emphysematous changes especially upper lobes again noted. Stable atelectasis or scarring in right lower lobe laterally. No segmental infiltrate or pulmonary edema. Electronically Signed   By: Natasha Mead M.D.   On: 11/15/2015 10:17    ASSESSMENT AND PLAN:   Active Problems:   COPD exacerbation (HCC)   Acute on chronic respiratory failure (HCC)   Pressure ulcer  * Ac on ch respi failure due to COPD exacerbation.   IV steroids, nebs, Abx. Meropenem.   Required Bipap, Unable to come off bipap now.   Component of anxiety also there.   Keep on xanax as needed.   Transferred back to ICU today.   Repeated x ray chest.   Pulmonary on case.  * Ch diastolic CHF   No exacerbation this time.   On diuretics.  * Hyperlipidemia   Cont Zetia.  * anxiety    Xanax.  * hypokalemia   Replace , recheck.   All the records are reviewed and case discussed with Care Management/Social Workerr. Management plans discussed with the patient, family and they are in agreement.  CODE STATUS: Full.  TOTAL TIME TAKING CARE OF THIS PATIENT: 35 critical care minutes.    POSSIBLE D/C IN 1-2 DAYS, DEPENDING ON CLINICAL CONDITION.   Altamese Dilling M.D on 11/15/2015   Between 7am to 6pm - Pager - [phone]  After 6pm go to [email or web address] - password [email or web address] [facility]  [facility]  Office  [phone]  CC: Primary care physician; Marisue Ivan, MD  Note: This dictation was prepared with Dragon dictation along with smaller phrase technology. Any transcriptional errors that result from this process are  unintentional.

## 2015-11-15 NOTE — Progress Notes (Signed)
ANTIBIOTIC CONSULT NOTE - Follow-up  Pharmacy Consult for Meropenem (2/5) Indication: pneumonia  Allergies  Allergen Reactions  . Atorvastatin   . Clindamycin Diarrhea, Other (See Comments) and Nausea Only  . Codeine   . Doxycycline   . Epinephrine Hives and Itching  . Erythromycin   . Fluticasone-Salmeterol   . Lasix [Furosemide] Itching  . Levofloxacin   . Penicillins   . Propoxyphene Hcl   . Rosuvastatin   . Simvastatin   . Singlet [Chlorphen-Pe-Acetaminophen] Other (See Comments)    Reaction: Wheezing  . Sulfamethoxazole-Trimethoprim   . Teriparatide   . Tetracyclines & Related Other (See Comments)    unknown  . Cefaclor Rash and Other (See Comments)    Reaction: Delirious    . Glipizide Hives  . Lisinopril Hives and Itching    Patient Measurements: Height:  (162.6 cm) Weight: 182 lb 6.4 oz (82.736 kg) IBW/kg (Calculated) : 54.7   Vital Signs: Temp: 98.2 F (36.8 C) (01/24 0959) Temp Source: Axillary (01/24 0959) BP: 139/65 mmHg (01/24 0959) Pulse Rate: 81 (01/24 0959) Intake/Output from previous day: 01/23 0701 - 01/24 0700 In: 100 [IV Piggyback:100] Out: -  Intake/Output from this shift:    Labs:  Recent Labs  11/13/15 1809 11/14/15 0448 11/15/15 0907  WBC 17.9* 12.5* 12.5*  HGB 10.3* 9.4* 8.9*  PLT 377 339 345  CREATININE 0.57 0.56 0.45   Estimated Creatinine Clearance: 53.5 mL/min (by C-G formula based on Cr of 0.45). No results for input(s): VANCOTROUGH, VANCOPEAK, VANCORANDOM, GENTTROUGH, GENTPEAK, GENTRANDOM, TOBRATROUGH, TOBRAPEAK, TOBRARND, AMIKACINPEAK, AMIKACINTROU, AMIKACIN in the last 72 hours.   Microbiology: Recent Results (from the past 720 hour(s))  MRSA PCR Screening     Status: None   Collection Time: 11/13/15  5:28 PM  Result Value Ref Range Status   MRSA by PCR NEGATIVE NEGATIVE Final    Comment:        The GeneXpert MRSA Assay (FDA approved for NASAL specimens only), is one component of a comprehensive MRSA  colonization surveillance program. It is not intended to diagnose MRSA infection nor to guide or monitor treatment for MRSA infections.     Medical History: Past Medical History  Diagnosis Date  . Essential hypertension   . Diabetes mellitus type II, controlled (HCC)   . COPD (chronic obstructive pulmonary disease) (HCC)   . Osteoporosis   . Carotid arterial disease (HCC)     a. 2012 Carotid dopplers: 40-59% R ICA, 60-79% L ICA  . Tic [place]oure[place]   . Arthralgia   . Sjoegren syndrome (HCC)   . TIA (transient ischemic attack)   . Emphysema   . MVP (mitral valve prolapse)   . Chronic diastolic CHF (congestive heart failure) (HCC)     a. 08/2011 Echo: EF 65-70%, mild LVH, mod dil LA, mildly dil RA, mild MR, mild-mod AoV Sclerosis w/o stenosis, mod-sev elev PA pressures, mild TR.  Marland Kitchen Acid reflux   . Diverticulitis   . Anemia   . History of pneumonia   . Syncope and collapse   . Anemia   . Hip fracture, left (HCC)   . Hyperlipidemia   . Hiatal hernia   . Chest pain     a. 08/2011 MV: EF 74%, no ischemia.  . Noncompliance     a. h/o not taking bumex as Rx.  Marland Kitchen PAF (paroxysmal atrial fibrillation) (HCC)     a. CHA2DS2VASc = 8 ->No OAC 2/2 falls.    Medications:  Prescriptions prior to admission  Medication  Sig Dispense Refill Last Dose  . albuterol (PROAIR HFA) 108 (90 BASE) MCG/ACT inhaler Inhale 2 puffs into the lungs every 6 (six) hours as needed. 3 Inhaler 0 prn  . albuterol (PROVENTIL) (2.5 MG/3ML) 0.083% nebulizer solution Take 2.5 mg by nebulization every 6 (six) hours as needed for wheezing or shortness of breath.    11/13/2015 at Unknown time  . aspirin EC 81 MG tablet Take 81 mg by mouth daily.   11/12/2015 at Unknown time  . beta carotene w/minerals (OCUVITE) tablet Take 1 tablet by mouth 2 (two) times daily.   11/12/2015 at Unknown time  . bumetanide (BUMEX) 2 MG tablet Take 1 tablet (2 mg total) by mouth 2 (two) times daily as needed. Take 1 tab daily (Patient taking  differently: Take 2 mg by mouth 2 (two) times daily. Take 1 tab daily) 60 tablet 11 11/13/2015 at Unknown time  . calcium carbonate (OS-CAL) 600 MG TABS tablet Take 600 mg by mouth daily with breakfast.   11/12/2015 at Unknown time  . chlorhexidine (PERIDEX) 0.12 % solution by Mouth Rinse route 2 (two) times daily. Rinse and spit 2 times a day for 30 seconds.  12 11/12/2015 at Unknown time  . citalopram (CELEXA) 20 MG tablet Take 20 mg by mouth daily with lunch.    11/12/2015 at Unknown time  . diphenhydramine-acetaminophen (TYLENOL PM) 25-500 MG TABS Take 1 tablet by mouth at bedtime as needed (for sleep.).    11/12/2015 at Unknown time  . doxazosin (CARDURA) 4 MG tablet Take 4 mg by mouth at bedtime.    11/12/2015 at Unknown time  . esomeprazole (NEXIUM) 40 MG capsule Take 40 mg by mouth daily before breakfast.   11/13/2015 at Unknown time  . ezetimibe (ZETIA) 10 MG tablet Take 10 mg by mouth at bedtime.    11/12/2015 at Unknown time  . fluticasone (FLONASE) 50 MCG/ACT nasal spray Place 1-2 sprays into both nostrils at bedtime.    11/12/2015 at Unknown time  . gabapentin (NEURONTIN) 100 MG capsule Take 100 mg by mouth 2 (two) times daily.    11/13/2015 at Unknown time  . HYDROcodone-acetaminophen (NORCO/VICODIN) 5-325 MG tablet Take 1 tablet by mouth See admin instructions. Take 1 tablet by mouth 2 times a day. Take 1 tablet by mouth during the day as needed for pain.   11/13/2015 at Unknown time  . metoCLOPramide (REGLAN) 5 MG tablet Take 5 mg by mouth 2 (two) times daily. Take before breakfast and supper.   11/13/2015 at Unknown time  . mometasone-formoterol (DULERA) 100-5 MCG/ACT AERO Inhale 2 puffs into the lungs 2 (two) times daily.    11/13/2015 at Unknown time  . Multiple Vitamin (MULTIVITAMIN) tablet Take 1 tablet by mouth every other day.    11/13/2015 at Unknown time  . mupirocin ointment (BACTROBAN) 2 % Apply 1 application topically daily as needed (for sore on leg.).    prn  . nebivolol (BYSTOLIC) 5  MG tablet Take 1 tablet (5 mg total) by mouth daily. 30 tablet 0 11/13/2015 at Unknown time  . nitroGLYCERIN (NITROSTAT) 0.4 MG SL tablet PLACE 1 TABLET (0.4 MG TOTAL) UNDER THE TONGUE EVERY 5 (FIVE) MINUTES AS NEEDED FOR CHEST PAIN. 25 tablet 3 Past Week at Unknown time  . potassium chloride (K-DUR,KLOR-CON) 10 MEQ tablet Take 0.5 tablets (5 mEq total) by mouth 2 (two) times daily as needed. Patient takes this when she takes Bumex. (Patient taking differently: Take 5 mEq by mouth 2 (two) times daily.  Patient takes this when she takes Bumex.) 60 tablet 3 11/13/2015 at Unknown time  . promethazine (PHENERGAN) 25 MG tablet Take 25 mg by mouth every 8 (eight) hours as needed. For nausea and vomiting.  0 prn  . SPIRIVA RESPIMAT 1.25 MCG/ACT AERS Inhale 1 puff into the lungs daily.  5 11/13/2015 at Unknown time  . spironolactone (ALDACTONE) 50 MG tablet Take 25 mg by mouth every Monday, Wednesday, and Friday. Takes 1 tablet Mon. Wednesday and Friday.   11/09/2015  . arformoterol (BROVANA) 15 MCG/2ML NEBU Take 2 mLs (15 mcg total) by nebulization 2 (two) times daily. 120 mL 4 Taking  . chlorthalidone (HYGROTON) 25 MG tablet Take 1 tablet (25 mg total) by mouth daily as needed. 30 tablet 6 Taking  . tiotropium (SPIRIVA) 18 MCG inhalation capsule Place 1 capsule (18 mcg total) into inhaler and inhale daily. 90 capsule 0    Assessment: Pharmacy consulted to dose meropenem for 82 year old female admitted with CAP, multiple drug allergies.  Plan:  Will continue meropenem at  IV q8 hours based on renal function.   Pharmacy will continue to follow renal function and culture results.    Simpson,Michael L 11/15/2015,11:44 AM

## 2015-11-15 NOTE — Progress Notes (Signed)
RT transported patient from 216 to ICU14 on V60 Bipap at prior settings without complication.  Patient sats remained high 90's.  Will continue to monitor.

## 2015-11-15 NOTE — Progress Notes (Signed)
RT to room to place patient on [place] after neb tx through Bipap.  Patient sat dropped to 84% on 4LPM [place].  RT placed patient back on Bipap 14/6 40%, RN Kim made aware.

## 2015-11-15 NOTE — Progress Notes (Signed)
Pt to be transferred to ICU14, report called to [place]. Adelina Mings RN-BC

## 2015-11-15 NOTE — Progress Notes (Signed)
Pt c/o itching/burning perineum area. Dr Anne Hahn notified

## 2015-11-15 NOTE — Progress Notes (Signed)
Patient refuses to wear BIPAP, patient states she is in no respiratory distress, placed patient on 4 liter nasal cannula. Explained to patient if she gets short of breath she will be placed on BIPAP. RN informed of patient decision.

## 2015-11-15 NOTE — Progress Notes (Signed)
Chaplain rounded in the unit and offered a compassionate presence and support to the patient. WE prayed with and for each other. Jefm Petty [phone]

## 2015-11-15 NOTE — Progress Notes (Signed)
Spoke to Dr. Madelon Lips, nursing supervisor made aware that patient requires continuous BIPAP. Pt to be transferred to ICU for further evaluation. Adelina Mings RN-BC

## 2015-11-15 NOTE — Progress Notes (Signed)
Patient agrees to wear BIPAP after getting herself worked up.

## 2015-11-15 NOTE — Progress Notes (Signed)
PULMONARY / CRITICAL CARE MEDICINE   Name: Catherine Holmes MRN: [id number] DOB: 1928/08/04    ADMISSION DATE:  11/13/2015 CONSULTATION DATE:  11/14/15  REFERRING MD :  Dr. Elisabeth Pigeon   CHIEF COMPLAINT:    SOB Reason for consult - AECOPD requiring bipap   HISTORY OF PRESENT ILLNESS    Patient is a pleasant 82 year old female past medical history of COPD, hypertension, diabetes, seen in  consultation for acute exacerbation of COPD. History obtained from patient. She states that she had a barium swallow study done last Tuesday and since then has been increasing shortness of breath, intermittent chills and feeling "hot," along with mildly productive sputum, greenish in nature. She had a chest x-ray done in the ER, did not show any acute infiltrate. However, due to the shortness of breath and requiring BiPAP with 2 L of oxygen to keep her sats above 90% she was admitted to the hospital for further inpatient care. This morning she is weaned off the BiPAP, only requiring 2-3 L of oxygen at the time of my examination. She admits to shortness of breath, productive cough, fatigue, right leg swelling.  SUBJECTIVE:  Patient transferred to medical floor yesterday, initially did well, overnight developed resp distress (inc wob, sob, desaturations), placed on bipap, but was unable to be weaned off.  Transferred back to the ICU.  Now with clinical improvement.    PAST MEDICAL HISTORY    :  Past Medical History  Diagnosis Date  . Essential hypertension   . Diabetes mellitus type II, controlled (HCC)   . COPD (chronic obstructive pulmonary disease) (HCC)   . Osteoporosis   . Carotid arterial disease (HCC)     a. 2012 Carotid dopplers: 40-59% R ICA, 60-79% L ICA  . Tic douloureux   . Arthralgia   . Sjoegren syndrome (HCC)   . TIA (transient ischemic attack)   . Emphysema   . MVP (mitral valve prolapse)   . Chronic diastolic CHF (congestive heart failure) (HCC)     a. 08/2011 Echo: EF 65-70%,  mild LVH, mod dil LA, mildly dil RA, mild MR, mild-mod AoV Sclerosis w/o stenosis, mod-sev elev PA pressures, mild TR.  Marland Kitchen Acid reflux   . Diverticulitis   . Anemia   . History of pneumonia   . Syncope and collapse   . Anemia   . Hip fracture, left (HCC)   . Hyperlipidemia   . Hiatal hernia   . Chest pain     a. 08/2011 MV: EF 74%, no ischemia.  . Noncompliance     a. h/o not taking bumex as Rx.  Marland Kitchen PAF (paroxysmal atrial fibrillation) (HCC)     a. CHA2DS2VASc = 8 ->No OAC 2/2 falls.   Past Surgical History  Procedure Laterality Date  . Cholecystectomy  1965  . Abdominal hysterectomy  1964  . Cataract extraction Bilateral   . Hip fracture surgery  2014    left hip   Prior to Admission medications   Medication Sig Start Date End Date Taking? Authorizing Provider  albuterol (PROAIR HFA) 108 (90 BASE) MCG/ACT inhaler Inhale 2 puffs into the lungs every 6 (six) hours as needed. 01/24/12  Yes Antonieta Iba, MD  albuterol (PROVENTIL) (2.5 MG/3ML) 0.083% nebulizer solution Take 2.5 mg by nebulization every 6 (six) hours as needed for wheezing or shortness of breath.    Yes Historical Provider, MD  aspirin EC 81 MG tablet Take 81 mg by mouth daily.   Yes Historical Provider, MD  beta carotene w/minerals (OCUVITE) tablet Take 1 tablet by mouth 2 (two) times daily.   Yes Historical Provider, MD  bumetanide (BUMEX) 2 MG tablet Take 1 tablet (2 mg total) by mouth 2 (two) times daily as needed. Take 1 tab daily Patient taking differently: Take 2 mg by mouth 2 (two) times daily. Take 1 tab daily 08/19/15  Yes Antonieta Iba, MD  calcium carbonate (OS-CAL) 600 MG TABS tablet Take 600 mg by mouth daily with breakfast.   Yes Historical Provider, MD  chlorhexidine (PERIDEX) 0.12 % solution by Mouth Rinse route 2 (two) times daily. Rinse and spit 2 times a day for 30 seconds. 10/09/15  Yes Historical Provider, MD  citalopram (CELEXA) 20 MG tablet Take 20 mg by mouth daily with lunch.    Yes  Historical Provider, MD  diphenhydramine-acetaminophen (TYLENOL PM) 25-500 MG TABS Take 1 tablet by mouth at bedtime as needed (for sleep.).    Yes Historical Provider, MD  doxazosin (CARDURA) 4 MG tablet Take 4 mg by mouth at bedtime.  05/31/15  Yes Antonieta Iba, MD  esomeprazole (NEXIUM) 40 MG capsule Take 40 mg by mouth daily before breakfast.   Yes Historical Provider, MD  ezetimibe (ZETIA) 10 MG tablet Take 10 mg by mouth at bedtime.    Yes Historical Provider, MD  fluticasone (FLONASE) 50 MCG/ACT nasal spray Place 1-2 sprays into both nostrils at bedtime.    Yes Historical Provider, MD  gabapentin (NEURONTIN) 100 MG capsule Take 100 mg by mouth 2 (two) times daily.    Yes Historical Provider, MD  HYDROcodone-acetaminophen (NORCO/VICODIN) 5-325 MG tablet Take 1 tablet by mouth See admin instructions. Take 1 tablet by mouth 2 times a day. Take 1 tablet by mouth during the day as needed for pain.   Yes Historical Provider, MD  metoCLOPramide (REGLAN) 5 MG tablet Take 5 mg by mouth 2 (two) times daily. Take before breakfast and supper.   Yes Historical Provider, MD  mometasone-formoterol (DULERA) 100-5 MCG/ACT AERO Inhale 2 puffs into the lungs 2 (two) times daily.    Yes Historical Provider, MD  Multiple Vitamin (MULTIVITAMIN) tablet Take 1 tablet by mouth every other day.    Yes Historical Provider, MD  mupirocin ointment (BACTROBAN) 2 % Apply 1 application topically daily as needed (for sore on leg.).    Yes Historical Provider, MD  nebivolol (BYSTOLIC) 5 MG tablet Take 1 tablet (5 mg total) by mouth daily. 04/03/15  Yes Sital Mody, MD  nitroGLYCERIN (NITROSTAT) 0.4 MG SL tablet PLACE 1 TABLET (0.4 MG TOTAL) UNDER THE TONGUE EVERY 5 (FIVE) MINUTES AS NEEDED FOR CHEST PAIN. 10/18/15  Yes Antonieta Iba, MD  potassium chloride (K-DUR,KLOR-CON) 10 MEQ tablet Take 0.5 tablets (5 mEq total) by mouth 2 (two) times daily as needed. Patient takes this when she takes Bumex. Patient taking differently:  Take 5 mEq by mouth 2 (two) times daily. Patient takes this when she takes Bumex. 06/09/15  Yes Antonieta Iba, MD  promethazine (PHENERGAN) 25 MG tablet Take 25 mg by mouth every 8 (eight) hours as needed. For nausea and vomiting. 09/21/15  Yes Historical Provider, MD  SPIRIVA RESPIMAT 1.25 MCG/ACT AERS Inhale 1 puff into the lungs daily. 10/18/15  Yes Historical Provider, MD  spironolactone (ALDACTONE) 50 MG tablet Take 25 mg by mouth every Monday, Wednesday, and Friday. Takes 1 tablet Mon. Wednesday and Friday.   Yes Historical Provider, MD  arformoterol (BROVANA) 15 MCG/2ML NEBU Take 2 mLs (15 mcg total)  by nebulization 2 (two) times daily. 12/26/11   Lupita Leash, MD  chlorthalidone (HYGROTON) 25 MG tablet Take 1 tablet (25 mg total) by mouth daily as needed. 09/29/15   Antonieta Iba, MD  tiotropium (SPIRIVA) 18 MCG inhalation capsule Place 1 capsule (18 mcg total) into inhaler and inhale daily. 01/24/12   Antonieta Iba, MD   Allergies  Allergen Reactions  . Atorvastatin   . Clindamycin Diarrhea, Other (See Comments) and Nausea Only  . Codeine   . Doxycycline   . Epinephrine Hives and Itching  . Erythromycin   . Fluticasone-Salmeterol   . Lasix [Furosemide] Itching  . Levofloxacin   . Penicillins   . Propoxyphene Hcl   . Rosuvastatin   . Simvastatin   . Singlet [Chlorphen-Pe-Acetaminophen] Other (See Comments)    Reaction: Wheezing  . Sulfamethoxazole-Trimethoprim   . Teriparatide   . Tetracyclines & Related Other (See Comments)    unknown  . Cefaclor Rash and Other (See Comments)    Reaction: Delirious    . Glipizide Hives  . Lisinopril Hives and Itching     FAMILY HISTORY   Family History  Problem Relation Age of Onset  . Heart attack Mother   . Heart attack Father   . Heart attack Sister       SOCIAL HISTORY    reports that she quit smoking about 21 years ago. Her smoking use included Cigarettes. She has a 15 pack-year smoking history. She has never  used smokeless tobacco. She reports that she drinks alcohol. She reports that she does not use illicit drugs.  Review of Systems  Constitutional: Positive for malaise/fatigue.  HENT: Negative for hearing loss and tinnitus.   Eyes: Negative for blurred vision and double vision.  Respiratory: Positive for cough, sputum production, shortness of breath and wheezing.   Cardiovascular: Positive for orthopnea and leg swelling. Negative for chest pain and palpitations.  Gastrointestinal: Negative for heartburn, nausea, vomiting and abdominal pain.  Genitourinary: Negative for dysuria.  Musculoskeletal: Negative for myalgias.  Skin: Negative for itching and rash.  Neurological: Negative for dizziness and headaches.  Endo/Heme/Allergies: Does not bruise/bleed easily.  Psychiatric/Behavioral: Negative for depression.      VITAL SIGNS    Temp:  [97.6 F (36.4 C)-98.3 F (36.8 C)] 98.2 F (36.8 C) (01/24 0959) Pulse Rate:  [62-101] 81 (01/24 0959) Resp:  [18-30] 24 (01/24 0959) BP: (93-166)/(50-84) 139/65 mmHg (01/24 0959) SpO2:  [69 %-100 %] 100 % (01/24 0959) FiO2 (%):  [40 %] 40 % (01/24 0801) Weight:  [180 lb 12.8 oz (82.01 kg)-182 lb 6.4 oz (82.736 kg)] 182 lb 6.4 oz (82.736 kg) (01/24 0655) HEMODYNAMICS:   VENTILATOR SETTINGS: Vent Mode:  [-]  FiO2 (%):  [40 %] 40 % INTAKE / OUTPUT:  Intake/Output Summary (Last 24 hours) at 11/15/15 1353 Last data filed at 11/15/15 0745  Gross per 24 hour  Intake    100 ml  Output      0 ml  Net    100 ml       PHYSICAL EXAM   Physical Exam  Constitutional: She is oriented to person, place, and time. She appears well-developed and well-nourished. No distress.  HENT:  Head: Normocephalic and atraumatic.  Right Ear: External ear normal.  Left Ear: External ear normal.  Mouth/Throat: Oropharynx is clear and moist. No oropharyngeal exudate.  Eyes: Conjunctivae and EOM are normal. Pupils are equal, round, and reactive to light.  Neck:  Normal range of motion. Neck  supple. No JVD present. No tracheal deviation present. No thyromegaly present.  Cardiovascular: Normal rate, regular rhythm, normal heart sounds and intact distal pulses.  Exam reveals no friction rub.   No murmur heard. Pulmonary/Chest: She is in respiratory distress. She has no rales.  Shallow breath sounds On Bipap Mild use of SCM   Abdominal: Soft. Bowel sounds are normal.  Musculoskeletal: Normal range of motion.  Trace right leg edema  Neurological: She is alert and oriented to person, place, and time.  Skin: Skin is warm and dry. No rash noted. She is not diaphoretic. No erythema.  Nursing note and vitals reviewed.      LABS   LABS:  CBC  Recent Labs Lab 11/13/15 1809 11/14/15 0448 11/15/15 0907  WBC 17.9* 12.5* 12.5*  HGB 10.3* 9.4* 8.9*  HCT 31.9* 29.2* 28.2*  PLT 377 339 345   Coag's No results for input(s): APTT, INR in the last 168 hours. BMET  Recent Labs Lab 11/13/15 1809 11/14/15 0448 11/15/15 0907  NA 136 137 133*  K 3.5 3.2* 4.1  CL 86* 87* 88*  CO2 37* 39* 39*  BUN CREATININE 0.57 0.56 0.45  GLUCOSE 182* 241* 162*   Electrolytes  Recent Labs Lab 11/13/15 1809 11/14/15 0448 11/15/15 0907  CALCIUM 8.4* 8.1* 8.2*   Sepsis Markers No results for input(s): LATICACIDVEN, PROCALCITON, O2SATVEN in the last 168 hours. ABG  Recent Labs Lab 11/15/15 1120  PHART 7.48*  PCO2ART 61*  PO2ART 77*   Liver Enzymes  Recent Labs Lab 11/13/15 1809  AST 20  ALT 15  ALKPHOS 99  BILITOT 0.9  ALBUMIN 3.7   Cardiac Enzymes  Recent Labs Lab 11/13/15 1809  TROPONINI 0.05*   Glucose  Recent Labs Lab 11/14/15 0807 11/14/15 1117 11/14/15 1641 11/14/15 2131 11/15/15 0735  GLUCAP 226* 200* 235* 110* 167*     Recent Results (from the past 240 hour(s))  MRSA PCR Screening     Status: None   Collection Time: 11/13/15  5:28 PM  Result Value Ref Range Status   MRSA by PCR NEGATIVE NEGATIVE  Final    Comment:        The GeneXpert MRSA Assay (FDA approved for NASAL specimens only), is one component of a comprehensive MRSA colonization surveillance program. It is not intended to diagnose MRSA infection nor to guide or monitor treatment for MRSA infections.      Current facility-administered medications:  .  ALPRAZolam Prudy Feeler) tablet 0.25 mg, 0.25 mg, Oral, TID PRN, Altamese Dilling, MD, 0.25 mg at 11/14/15 2152 .  alum & mag hydroxide-simeth (MAALOX/MYLANTA) 200-200-20 MG/5ML suspension 30 mL, 30 mL, Oral, Q6H PRN, Katha Hamming, MD .  arformoterol (BROVANA) nebulizer solution 15 mcg, 15 mcg, Nebulization, BID, Katha Hamming, MD, 15 mcg at 11/15/15 0800 .  aspirin EC tablet 81 mg, 81 mg, Oral, Daily, Katha Hamming, MD, 81 mg at 11/14/15 1153 .  beta carotene w/minerals (OCUVITE) tablet 1 tablet, 1 tablet, Oral, BID, Katha Hamming, MD, 1 tablet at 11/14/15 2137 .  bisacodyl (DULCOLAX) EC tablet 5 mg, 5 mg, Oral, Daily PRN, Katha Hamming, MD .  budesonide (PULMICORT) nebulizer solution 0.5 mg, 0.5 mg, Nebulization, BID, Sidni Fusco, MD, 0.5 mg at 11/15/15 0800 .  bumetanide (BUMEX) injection 1 mg, 1 mg, Intravenous, Once, Arnaldo Natal, MD, 1 mg at 11/13/15 2202 .  bumetanide (BUMEX) injection 1 mg, 1 mg, Intravenous, Q12H, Katha Hamming, MD, 1 mg at 11/14/15 1059 .  calcium  carbonate (TUMS - dosed in mg elemental calcium) chewable tablet 1,500 mg, 1,500 mg, Oral, Q breakfast, Katha Hamming, MD, 1,500 mg at 11/14/15 0859 .  chlorhexidine (PERIDEX) 0.12 % solution 10 mL, 10 mL, Mouth Rinse, BID, Katha Hamming, MD, 10 mL at 11/14/15 2136 .  chlorthalidone (HYGROTON) tablet 25 mg, 25 mg, Oral, Daily, Katha Hamming, MD .  citalopram (CELEXA) tablet 20 mg, 20 mg, Oral, Q lunch, Katha Hamming, MD, 20 mg at 11/14/15 1203 .  docusate sodium (COLACE) capsule 100 mg, 100 mg, Oral, BID, Katha Hamming, MD .   doxazosin (CARDURA) tablet 4 mg, 4 mg, Oral, QHS, Katha Hamming, MD, 4 mg at 11/14/15 2137 .  ezetimibe (ZETIA) tablet 10 mg, 10 mg, Oral, QHS, Katha Hamming, MD, 10 mg at 11/14/15 2138 .  feeding supplement (ENSURE ENLIVE) (ENSURE ENLIVE) liquid 237 mL, 237 mL, Oral, BID BM, Altamese Dilling, MD, 237 mL at 11/14/15 1532 .  fluticasone (FLONASE) 50 MCG/ACT nasal spray 1-2 spray, 1-2 spray, Each Nare, QHS, Katha Hamming, MD .  gabapentin (NEURONTIN) capsule 100 mg, 100 mg, Oral, BID, Katha Hamming, MD, 100 mg at 11/14/15 2140 .  heparin injection 5,000 Units, 5,000 Units, Subcutaneous, 3 times per day, Katha Hamming, MD, 5,000 Units at 11/14/15 2136 .  insulin aspart (novoLOG) injection 0-5 Units, 0-5 Units, Subcutaneous, QHS, Lautaro Koral, MD, 0 Units at 11/14/15 2200 .  insulin aspart (novoLOG) injection 0-9 Units, 0-9 Units, Subcutaneous, TID WC, Onelia Cadmus, MD, 2 Units at 11/15/15 0800 .  ipratropium-albuterol (DUONEB) 0.5-2.5 (3) MG/3ML nebulizer solution 3 mL, 3 mL, Nebulization, Once, Arnaldo Natal, MD, Stopped at 11/13/15 1755 .  ipratropium-albuterol (DUONEB) 0.5-2.5 (3) MG/3ML nebulizer solution 3 mL, 3 mL, Nebulization, Once, Arnaldo Natal, MD, Stopped at 11/13/15 1755 .  ipratropium-albuterol (DUONEB) 0.5-2.5 (3) MG/3ML nebulizer solution 3 mL, 3 mL, Nebulization, Q6H, Katha Hamming, MD, 3 mL at 11/15/15 0801 .  meropenem (MERREM) 500 mg in sodium chloride 0.9 % 50 mL IVPB, 500 mg, Intravenous, 3 times per day, Bertram Savin, [facility] .  methylPREDNISolone sodium succinate (SOLU-MEDROL) 40 mg/mL injection 40 mg, 40 mg, Intravenous, Q6H, Altamese Dilling, MD, 40 mg at 11/15/15 1131 .  metoCLOPramide (REGLAN) tablet 5 mg, 5 mg, Oral, BID, Katha Hamming, MD, 5 mg at 11/14/15 1059 .  multivitamin with minerals tablet 1 tablet, 1 tablet, Oral, QODAY, Katha Hamming, MD, 1 tablet at 11/14/15 1803 .  nebivolol (BYSTOLIC) tablet 5 mg, 5  mg, Oral, Daily, Katha Hamming, MD, 5 mg at 11/14/15 1059 .  ondansetron (ZOFRAN) tablet 4 mg, 4 mg, Oral, Q6H PRN **OR** ondansetron (ZOFRAN) injection 4 mg, 4 mg, Intravenous, Q6H PRN, Katha Hamming, MD .  pantoprazole (PROTONIX) EC tablet 40 mg, 40 mg, Oral, BID, Nillie Bartolotta, MD .  Melene Muller ON 11/16/2015] pneumococcal 23 valent vaccine (PNU-IMMUNE) injection 0.5 mL, 0.5 mL, Intramuscular, Tomorrow-1000, Altamese Dilling, MD .  spironolactone (ALDACTONE) tablet 25 mg, 25 mg, Oral, Q M,W,F, Katha Hamming, MD, 25 mg at 11/14/15 1059 .  tiotropium (SPIRIVA) inhalation capsule 18 mcg, 18 mcg, Inhalation, Daily, Katha Hamming, MD, 18 mcg at 11/14/15 0814 .  traZODone (DESYREL) tablet 25 mg, 25 mg, Oral, QHS PRN, Katha Hamming, MD, 25 mg at 11/14/15 2150  Facility-Administered Medications Ordered in Other Encounters:  .  0.9 %  sodium chloride infusion, , Intravenous, Once, Rosey Bath, MD .  alteplase (CATHFLO ACTIVASE) injection 2 mg, 2 mg, Intracatheter, Once PRN, Rosey Bath, MD .  ferumoxytol [facility]) 510  mg in sodium chloride 0.9 % 100 mL IVPB, 510 mg, Intravenous, Once, Rosey Bath, MD .  heparin lock flush 100 unit/mL, 500 Units, Intracatheter, Once PRN, Rosey Bath, MD .  heparin lock flush 100 unit/mL, 250 Units, Intracatheter, Once PRN, Rosey Bath, MD .  sodium chloride 0.9 % injection 10 mL, 10 mL, Intracatheter, PRN, Rosey Bath, MD .  sodium chloride 0.9 % injection 3 mL, 3 mL, Intravenous, Once PRN, Rosey Bath, MD  IMAGING    Dg Chest Port 1 View  11/15/2015  CLINICAL DATA:  Respiratory failure EXAM: PORTABLE CHEST 1 VIEW COMPARISON:  11/13/2015 FINDINGS: Cardiomediastinal silhouette is stable. Mild hyperinflation and mild emphysematous changes especially upper lobes again noted. Stable atelectasis or scarring in right lower lobe laterally. No segmental infiltrate or pulmonary edema. IMPRESSION:  Mild hyperinflation and mild emphysematous changes especially upper lobes again noted. Stable atelectasis or scarring in right lower lobe laterally. No segmental infiltrate or pulmonary edema. Electronically Signed   By: Natasha Mead M.D.   On: 11/15/2015 10:17      Indwelling Urinary Catheter continued, requirement due to   Reason to continue Indwelling Urinary Catheter for strict Intake/Output monitoring for hemodynamic instability   Central Line continued, requirement due to   Reason to continue [employer] Monitoring of central venous pressure or other hemodynamic parameters   Ventilator continued, requirement due to, resp failure    Ventilator Sedation RASS 0 to -2   Cultures: BCx2  UC  Sputum 1/23>>  Antibiotics: Meropenem 1/22>>  Lines:   ASSESSMENT/PLAN  82 year old female past medical history of COPD, diabetes, hypertension, admitted for acute COPD exacerbation, transerred to med unit 1/23, transferred back to ICU on 1/24 due to respiratory distress and unable to wean off bipap.   AECOPD - Prednisone 40 mg daily 5 days total -Continue with meropenem -Continue with the scheduled nebulizers -Sputum culture -Maintain O2 saturations greater than 88% -Chest physiotherapy, Acapella -BiPAP daily at bedtime 3 nights, minimal 4-6 hours per night, when necessary BiPAP during the day with naps -When necessary nebulizers -Patient states she wears 2 L of O2 as needed at home -Primary pulmonologist Dr. Welton Flakes -Chest x-ray with no acute infiltrates, however does have productive greenish sputum. - ABG with respiratory acidosis, cont with QD PRN bipap, may use HFNC to maintain sats>88%   Chronic diastolic failure -Continue with Bumex and Aldactone -Monitor I's and O's -Monitor blood pressure  Hypertension -Monitor hemodynamics    I have personally obtained a history, examined the patient, evaluated laboratory and imaging results, formulated the assessment and plan and  placed orders. Pulmonary Time devoted to patient care services described in this note is 50 minutes.    Stephanie Acre, MD [place] Pager [phone] (please enter 7-digits) On Call Pager - [phone] (please enter 7-digits)     11/15/2015, 1:53 PM  Note: This note was prepared with Dragon dictation along with smaller phrase technology. Any transcriptional errors that result from this process are unintentional.

## 2015-11-16 DIAGNOSIS — J9621 Acute and chronic respiratory failure with hypoxia: Secondary | ICD-10-CM | POA: Diagnosis not present

## 2015-11-16 DIAGNOSIS — F419 Anxiety disorder, unspecified: Secondary | ICD-10-CM

## 2015-11-16 DIAGNOSIS — L8990 Pressure ulcer of unspecified site, unspecified stage: Secondary | ICD-10-CM

## 2015-11-16 LAB — GLUCOSE, CAPILLARY
Glucose-Capillary: 135 mg/dL — ABNORMAL HIGH (ref 65–99)
Glucose-Capillary: 125 mg/dL — ABNORMAL HIGH (ref 65–99)
Glucose-Capillary: 193 mg/dL — ABNORMAL HIGH (ref 65–99)
Glucose-Capillary: 145 mg/dL — ABNORMAL HIGH (ref 65–99)

## 2015-11-16 LAB — BASIC METABOLIC PANEL
ANION GAP: 8 (ref 5–15)
BUN: 21 mg/dL — AB (ref 6–20)
CHLORIDE: 82 mmol/L — AB (ref 101–111)
CO2: 42 mmol/L — ABNORMAL HIGH (ref 22–32)
Calcium: 8.5 mg/dL — ABNORMAL LOW (ref 8.9–10.3)
Creatinine, Ser: 0.5 mg/dL (ref 0.44–1.00)
GFR calc Af Amer: >60 mL/min (ref >60)
GFR calc non Af Amer: >60 mL/min (ref >60)
GLUCOSE: 124 mg/dL — AB (ref 65–99)
POTASSIUM: 3.6 mmol/L (ref 3.5–5.1)
Sodium: 132 mmol/L — ABNORMAL LOW (ref 135–145)

## 2015-11-16 LAB — CBC
HEMATOCRIT: 28.1 % — AB (ref 35.0–47.0)
HEMOGLOBIN: 9.1 g/dL — AB (ref 12.0–16.0)
MCH: 27.2 pg (ref 26.0–34.0)
MCHC: 32.3 g/dL (ref 32.0–36.0)
MCV: 84.1 fL (ref 80.0–100.0)
Platelets: 343 10*3/uL (ref 150–440)
RBC: 3.34 MIL/uL — ABNORMAL LOW (ref 3.80–5.20)
RDW: 17 % — AB (ref 11.5–14.5)
WBC: 8.3 10*3/uL (ref 3.6–11.0)

## 2015-11-16 MED ORDER — IPRATROPIUM-ALBUTEROL 0.5-2.5 (3) MG/3ML IN SOLN
3.0000 mL | RESPIRATORY_TRACT | Status: DC | PRN
  Administered 2015-11-17 – 2015-11-18 (×4): 3 mL via RESPIRATORY_TRACT
  Filled 2015-11-16 (×4): qty 3

## 2015-11-16 MED ORDER — SODIUM CHLORIDE 0.9 % IV SOLN
500.0000 mg | Freq: Two times a day (BID) | INTRAVENOUS | Status: AC
  Administered 2015-11-17 – 2015-11-18 (×4): 500 mg via INTRAVENOUS
  Filled 2015-11-16 (×5): qty 0.5

## 2015-11-16 MED ORDER — HYDRALAZINE HCL 20 MG/ML IJ SOLN
20.0000 mg | Freq: Four times a day (QID) | INTRAMUSCULAR | Status: DC | PRN
  Filled 2015-11-16: qty 1

## 2015-11-16 MED ORDER — METHYLPREDNISOLONE SODIUM SUCC 40 MG IJ SOLR
20.0000 mg | Freq: Every day | INTRAMUSCULAR | Status: DC
  Administered 2015-11-17 – 2015-11-18 (×2): 20 mg via INTRAVENOUS
  Filled 2015-11-16 (×2): qty 1

## 2015-11-16 NOTE — Progress Notes (Signed)
PULMONARY / CRITICAL CARE MEDICINE   Name: Catherine Holmes MRN: [id number] DOB: 05/29/1928    ADMISSION DATE:  11/13/2015 Consulting physician - Dr. Elisabeth Pigeon  BRIEF HISTORY: 82 yo with PMHx of COPD, anxiety, HTN, DM, admitted for AECOPD required bipap.   SUBJECTIVE:  Doing well this AM on HFNC, still with some expiratory wheezes, but improving.   STUDIES:   VITAL SIGNS: Temp:  [96.3 F (35.7 C)-98.2 F (36.8 C)] 96.3 F (35.7 C) (01/25 0800) Pulse Rate:  [71-96] 78 (01/25 0700) Resp:  [16-24] 21 (01/25 0700) BP: (120-187)/(58-127) 155/94 mmHg (01/25 0700) SpO2:  [83 %-100 %] 99 % (01/25 0741) FiO2 (%):  [40 %-43 %] 40 % (01/25 0741) Weight:  [159 lb 2.8 oz (72.2 kg)-159 lb 6.3 oz (72.3 kg)] 159 lb 6.3 oz (72.3 kg) (01/25 0500) HEMODYNAMICS:   VENTILATOR SETTINGS: Vent Mode:  [-]  FiO2 (%):  [40 %-43 %] 40 % INTAKE / OUTPUT:  Intake/Output Summary (Last 24 hours) at 11/16/15 0958 Last data filed at 11/16/15 0645  Gross per 24 hour  Intake    150 ml  Output      0 ml  Net    150 ml    Review of Systems  Constitutional: Negative for fever and chills.  HENT: Negative for congestion and hearing loss.   Eyes: Negative for blurred vision and double vision.  Respiratory: Positive for cough, sputum production, shortness of breath and wheezing. Negative for hemoptysis.   Cardiovascular: Negative for chest pain and palpitations.  Gastrointestinal: Negative for heartburn, nausea and vomiting.  Genitourinary: Negative for dysuria.  Musculoskeletal: Negative for myalgias.  Skin: Negative for itching and rash.  Neurological: Negative for dizziness and headaches.  Endo/Heme/Allergies: Does not bruise/bleed easily.    Physical Exam  Constitutional: She is oriented to person, place, and time and well-developed, well-nourished, and in no distress.  HENT:  Head: Normocephalic and atraumatic.  Right Ear: External ear normal.  Left Ear: External ear normal.  Eyes: EOM are  normal. Pupils are equal, round, and reactive to light. Left eye exhibits no discharge.  Neck: Normal range of motion. Neck supple. No tracheal deviation present. No thyromegaly present.  Cardiovascular: Normal rate, regular rhythm, normal heart sounds and intact distal pulses.   No murmur heard. Pulmonary/Chest: No respiratory distress. She has wheezes. She has no rales. She exhibits no tenderness.  On HFNC, tolerating well. Mild to mod exp wheezes mostly in the upper lobes.   Abdominal: Soft. Bowel sounds are normal. She exhibits no distension. There is no tenderness.  Musculoskeletal: Normal range of motion. She exhibits edema.  Trace R leg edema (chronic).   Neurological: She is oriented to person, place, and time.  Skin: Skin is warm and dry.  Psychiatric: Affect normal.  Nursing note and vitals reviewed.    LABS:  CBC  Recent Labs Lab 11/14/15 0448 11/15/15 0907 11/16/15 0431  WBC 12.5* 12.5* 8.3  HGB 9.4* 8.9* 9.1*  HCT 29.2* 28.2* 28.1*  PLT 339 345 343   Coag's No results for input(s): APTT, INR in the last 168 hours. BMET  Recent Labs Lab 11/15/15 0907 11/15/15 1442 11/16/15 0431  NA 133* 131* 132*  K 4.1 4.4 3.6  CL 88* 86* 82*  CO2 39* 40* 42*  BUN 18 16 21*  CREATININE 0.45 0.70 0.50  GLUCOSE 162* 185* 124*   Electrolytes  Recent Labs Lab 11/15/15 0907 11/15/15 1442 11/16/15 0431  CALCIUM 8.2* 8.4* 8.5*   Sepsis Markers No  results for input(s): LATICACIDVEN, PROCALCITON, O2SATVEN in the last 168 hours. ABG  Recent Labs Lab 11/15/15 1120  PHART 7.48*  PCO2ART 61*  PO2ART 77*   Liver Enzymes  Recent Labs Lab 11/13/15 1809  AST 20  ALT 15  ALKPHOS 99  BILITOT 0.9  ALBUMIN 3.7   Cardiac Enzymes  Recent Labs Lab 11/13/15 1809  TROPONINI 0.05*   Glucose  Recent Labs Lab 11/14/15 2131 11/15/15 0735 11/15/15 1411 11/15/15 1546 11/15/15 2324 11/16/15 0739  GLUCAP 110* 167* 157* 179* 212* 135*    Imaging Dg Chest  Port 1 View  11/15/2015  CLINICAL DATA:  Respiratory failure EXAM: PORTABLE CHEST 1 VIEW COMPARISON:  11/13/2015 FINDINGS: Cardiomediastinal silhouette is stable. Mild hyperinflation and mild emphysematous changes especially upper lobes again noted. Stable atelectasis or scarring in right lower lobe laterally. No segmental infiltrate or pulmonary edema. IMPRESSION: Mild hyperinflation and mild emphysematous changes especially upper lobes again noted. Stable atelectasis or scarring in right lower lobe laterally. No segmental infiltrate or pulmonary edema. Electronically Signed   By: Natasha Mead M.D.   On: 11/15/2015 10:17   Cultures: BCx2  UC  Sputum 1/23>>  Antibiotics: Meropenem 1/22>>  Lines:  ASSESSMENT / PLAN:  82 year old female past medical history of COPD, diabetes, hypertension, admitted for acute COPD exacerbation, transerred to med unit 1/23, transferred back to ICU on 1/24 due to respiratory distress and unable to wean off bipap.   AECOPD - Prednisone daily 5 days total, stop date entered -Continue with meropenem (5 days total) -Continue with the scheduled nebulizers -Sputum culture -Maintain O2 saturations greater than 88%, may continue to use HFNC -Chest physiotherapy, Acapella -BiPAP daily at bedtime 3 nights, minimal 4-6 hours per night, when necessary BiPAP during the day with naps -When necessary nebulizers -Patient states she wears 2 L of O2 as needed at home, will continue -Primary pulmonologist Dr. Welton Flakes -Chest x-ray with no acute infiltrates, however does have productive greenish sputum.   Chronic diastolic failure -Continue with Bumex and Aldactone -Monitor I's and O's -Monitor blood pressure  Hypertension -Monitor hemodynamics -PRN Hydralazine for SBP >  Severe Reflux and Large Hiatal Hernia - cont with BID PPI - the combination of these two after her barium swallow may have caused some silent reflux/aspiration and triggered her COPD.    I  have personally obtained a history, examined the patient, evaluated laboratory and imaging results, formulated the assessment and plan and placed orders. Pulmonary Time devoted to patient care services described in this note is 40 minutes.    Stephanie Acre, MD [facility] Pager [phone] (please enter 7-digits) On Call Pager - [phone] (please enter 7-digits)  Note: This note was prepared with Dragon dictation along with smaller phrase technology. Any transcriptional errors that result from this process are unintentional.

## 2015-11-16 NOTE — Progress Notes (Signed)
ANTIBIOTIC CONSULT NOTE - Follow-up  Pharmacy Consult for Meropenem (3/5) Indication: pneumonia  Allergies  Allergen Reactions  . Atorvastatin   . Clindamycin Diarrhea, Other (See Comments) and Nausea Only  . Codeine   . Doxycycline   . Epinephrine Hives and Itching  . Erythromycin   . Fluticasone-Salmeterol   . Lasix [Furosemide] Itching  . Levofloxacin   . Penicillins   . Propoxyphene Hcl   . Rosuvastatin   . Simvastatin   . Singlet [Chlorphen-Pe-Acetaminophen] Other (See Comments)    Reaction: Wheezing  . Sulfamethoxazole-Trimethoprim   . Teriparatide   . Tetracyclines & Related Other (See Comments)    unknown  . Cefaclor Rash and Other (See Comments)    Reaction: Delirious    . Glipizide Hives  . Lisinopril Hives and Itching    Patient Measurements: Height: 5' (152.4 cm) Weight: 159 lb 6.3 oz (72.3 kg) IBW/kg (Calculated) : 45.5   Vital Signs: Temp: 98.5 F (36.9 C) (01/25 1600) Temp Source: Oral (01/25 1600) BP: 131/62 mmHg (01/25 1500) Pulse Rate: 73 (01/25 1504) Intake/Output from previous day: 01/24 0701 - 01/25 0700 In: 150 [IV Piggyback:150] Out: -  Intake/Output from this shift:    Labs:  Recent Labs  11/14/15 0448 11/15/15 0907 11/15/15 1442 11/16/15 0431  WBC 12.5* 12.5*  --  8.3  HGB 9.4* 8.9*  --  9.1*  PLT 339 345  --  343  CREATININE 0.56 0.45 0.70 0.50   Estimated Creatinine Clearance: 45.6 mL/min (by C-G formula based on Cr of 0.5). No results for input(s): VANCOTROUGH, VANCOPEAK, VANCORANDOM, GENTTROUGH, GENTPEAK, GENTRANDOM, TOBRATROUGH, TOBRAPEAK, TOBRARND, AMIKACINPEAK, AMIKACINTROU, AMIKACIN in the last 72 hours.   Microbiology: Recent Results (from the past 720 hour(s))  MRSA PCR Screening     Status: None   Collection Time: 11/13/15  5:28 PM  Result Value Ref Range Status   MRSA by PCR NEGATIVE NEGATIVE Final    Comment:        The GeneXpert MRSA Assay (FDA approved for NASAL specimens only), is one component of  a comprehensive MRSA colonization surveillance program. It is not intended to diagnose MRSA infection nor to guide or monitor treatment for MRSA infections.     Medical History: Past Medical History  Diagnosis Date  . Essential hypertension   . Diabetes mellitus type II, controlled (HCC)   . COPD (chronic obstructive pulmonary disease) (HCC)   . Osteoporosis   . Carotid arterial disease (HCC)     a. 2012 Carotid dopplers: 40-59% R ICA, 60-79% L ICA  . Tic douloureux   . Arthralgia   . Sjoegren syndrome (HCC)   . TIA (transient ischemic attack)   . Emphysema   . MVP (mitral valve prolapse)   . Chronic diastolic CHF (congestive heart failure) (HCC)     a. 08/2011 Echo: EF 65-70%, mild LVH, mod dil LA, mildly dil RA, mild MR, mild-mod AoV Sclerosis w/o stenosis, mod-sev elev PA pressures, mild TR.  Marland Kitchen Acid reflux   . Diverticulitis   . Anemia   . History of pneumonia   . Syncope and collapse   . Anemia   . Hip fracture, left (HCC)   . Hyperlipidemia   . Hiatal hernia   . Chest pain     a. 08/2011 MV: EF 74%, no ischemia.  . Noncompliance     a. h/o not taking bumex as Rx.  Marland Kitchen PAF (paroxysmal atrial fibrillation) (HCC)     a. CHA2DS2VASc = 8 ->No OAC 2/2 falls.  Medications:  Prescriptions prior to admission  Medication Sig Dispense Refill Last Dose  . albuterol (PROAIR HFA) 108 (90 BASE) MCG/ACT inhaler Inhale 2 puffs into the lungs every 6 (six) hours as needed. 3 Inhaler 0 prn  . albuterol (PROVENTIL) (2.5 MG/3ML) 0.083% nebulizer solution Take 2.5 mg by nebulization every 6 (six) hours as needed for wheezing or shortness of breath.    11/13/2015 at Unknown time  . aspirin EC 81 MG tablet Take 81 mg by mouth daily.   11/12/2015 at Unknown time  . beta carotene w/minerals (OCUVITE) tablet Take 1 tablet by mouth 2 (two) times daily.   11/12/2015 at Unknown time  . bumetanide (BUMEX) 2 MG tablet Take 1 tablet (2 mg total) by mouth 2 (two) times daily as needed. Take 1 tab  daily (Patient taking differently: Take 2 mg by mouth 2 (two) times daily. Take 1 tab daily) 60 tablet 11 11/13/2015 at Unknown time  . calcium carbonate (OS-CAL) 600 MG TABS tablet Take 600 mg by mouth daily with breakfast.   11/12/2015 at Unknown time  . chlorhexidine (PERIDEX) 0.12 % solution by Mouth Rinse route 2 (two) times daily. Rinse and spit 2 times a day for 30 seconds.  12 11/12/2015 at Unknown time  . citalopram (CELEXA) 20 MG tablet Take 20 mg by mouth daily with lunch.    11/12/2015 at Unknown time  . diphenhydramine-acetaminophen (TYLENOL PM) 25-500 MG TABS Take 1 tablet by mouth at bedtime as needed (for sleep.).    11/12/2015 at Unknown time  . doxazosin (CARDURA) 4 MG tablet Take 4 mg by mouth at bedtime.    11/12/2015 at Unknown time  . esomeprazole (NEXIUM) 40 MG capsule Take 40 mg by mouth daily before breakfast.   11/13/2015 at Unknown time  . ezetimibe (ZETIA) 10 MG tablet Take 10 mg by mouth at bedtime.    11/12/2015 at Unknown time  . fluticasone (FLONASE) 50 MCG/ACT nasal spray Place 1-2 sprays into both nostrils at bedtime.    11/12/2015 at Unknown time  . gabapentin (NEURONTIN) 100 MG capsule Take 100 mg by mouth 2 (two) times daily.    11/13/2015 at Unknown time  . HYDROcodone-acetaminophen (NORCO/VICODIN) 5-325 MG tablet Take 1 tablet by mouth See admin instructions. Take 1 tablet by mouth 2 times a day. Take 1 tablet by mouth during the day as needed for pain.   11/13/2015 at Unknown time  . metoCLOPramide (REGLAN) 5 MG tablet Take 5 mg by mouth 2 (two) times daily. Take before breakfast and supper.   11/13/2015 at Unknown time  . mometasone-formoterol (DULERA) 100-5 MCG/ACT AERO Inhale 2 puffs into the lungs 2 (two) times daily.    11/13/2015 at Unknown time  . Multiple Vitamin (MULTIVITAMIN) tablet Take 1 tablet by mouth every other day.    11/13/2015 at Unknown time  . mupirocin ointment (BACTROBAN) 2 % Apply 1 application topically daily as needed (for sore on leg.).    prn  .  nebivolol (BYSTOLIC) 5 MG tablet Take 1 tablet (5 mg total) by mouth daily. 30 tablet 0 11/13/2015 at Unknown time  . nitroGLYCERIN (NITROSTAT) 0.4 MG SL tablet PLACE 1 TABLET (0.4 MG TOTAL) UNDER THE TONGUE EVERY 5 (FIVE) MINUTES AS NEEDED FOR CHEST PAIN. 25 tablet 3 Past Week at Unknown time  . potassium chloride (K-DUR,KLOR-CON) 10 MEQ tablet Take 0.5 tablets (5 mEq total) by mouth 2 (two) times daily as needed. Patient takes this when she takes Bumex. (Patient taking differently: Take  5 mEq by mouth 2 (two) times daily. Patient takes this when she takes Bumex.) 60 tablet 3 11/13/2015 at Unknown time  . promethazine (PHENERGAN) 25 MG tablet Take 25 mg by mouth every 8 (eight) hours as needed. For nausea and vomiting.  0 prn  . SPIRIVA RESPIMAT 1.25 MCG/ACT AERS Inhale 1 puff into the lungs daily.  5 11/13/2015 at Unknown time  . spironolactone (ALDACTONE) 50 MG tablet Take 25 mg by mouth every Monday, Wednesday, and Friday. Takes 1 tablet Mon. Wednesday and Friday.   11/09/2015  . arformoterol (BROVANA) 15 MCG/2ML NEBU Take 2 mLs (15 mcg total) by nebulization 2 (two) times daily. 120 mL 4 Taking  . chlorthalidone (HYGROTON) 25 MG tablet Take 1 tablet (25 mg total) by mouth daily as needed. 30 tablet 6 Taking  . tiotropium (SPIRIVA) 18 MCG inhalation capsule Place 1 capsule (18 mcg total) into inhaler and inhale daily. 90 capsule 0    Assessment: Pharmacy consulted to dose meropenem for 82 year old female admitted with CAP, multiple drug allergies.  Plan:  Will continue meropenem at  IV q12hr.   Pharmacy will continue to follow renal function and culture results.    Simpson,Michael L 11/16/2015,4:22 PM

## 2015-11-16 NOTE — Care Management (Signed)
Patient admitted  from home to icu due to the need for continuous bipap.  She has chronic 02 in the home at 2.5 liters.  When ems arrived patient's sats were in the 70's on 2.5 liters.  She is currently on HFNC and not able to participate in conversation.

## 2015-11-17 DIAGNOSIS — F411 Generalized anxiety disorder: Secondary | ICD-10-CM

## 2015-11-17 LAB — BASIC METABOLIC PANEL
Anion gap: 8 (ref 5–15)
BUN: 26 mg/dL — AB (ref 6–20)
CALCIUM: 8.8 mg/dL — AB (ref 8.9–10.3)
CO2: 44 mmol/L — ABNORMAL HIGH (ref 22–32)
Chloride: 81 mmol/L — ABNORMAL LOW (ref 101–111)
Creatinine, Ser: 0.59 mg/dL (ref 0.44–1.00)
GFR calc Af Amer: >60 mL/min (ref >60)
GLUCOSE: 134 mg/dL — AB (ref 65–99)
Potassium: 3.7 mmol/L (ref 3.5–5.1)
Sodium: 133 mmol/L — ABNORMAL LOW (ref 135–145)

## 2015-11-17 LAB — GLUCOSE, CAPILLARY
Glucose-Capillary: 123 mg/dL — ABNORMAL HIGH (ref 65–99)
Glucose-Capillary: 114 mg/dL — ABNORMAL HIGH (ref 65–99)
Glucose-Capillary: 220 mg/dL — ABNORMAL HIGH (ref 65–99)
Glucose-Capillary: 270 mg/dL — ABNORMAL HIGH (ref 65–99)

## 2015-11-17 NOTE — Progress Notes (Signed)
PULMONARY / CRITICAL CARE MEDICINE   Name: Catherine Holmes MRN: [id number] DOB: 24-Dec-1927    ADMISSION DATE:  11/13/2015 Consulting physician - Dr. Elisabeth Pigeon  BRIEF HISTORY: 82 yo with PMHx of COPD, anxiety, HTN, DM, admitted for AECOPD required bipap.   SUBJECTIVE:  Doing well this today, still wants bipap or HFNC even though her O2 sats>88% and she is no longer wheezing STUDIES:   VITAL SIGNS: Temp:  [98.2 F (36.8 C)-98.5 F (36.9 C)] 98.2 F (36.8 C) (01/26 0200) Pulse Rate:  [56-80] 56 (01/26 0700) Resp:  [14-26] 17 (01/26 0700) BP: (130-173)/(38-71) 142/45 mmHg (01/26 0700) SpO2:  [78 %-100 %] 100 % (01/26 1126) FiO2 (%):  [35 %-40 %] 40 % (01/26 1126) Weight:  [156 lb 15.5 oz (71.2 kg)] 156 lb 15.5 oz (71.2 kg) (01/26 0500) HEMODYNAMICS:   VENTILATOR SETTINGS: Vent Mode:  [-]  FiO2 (%):  [35 %-40 %] 40 % INTAKE / OUTPUT:  Intake/Output Summary (Last 24 hours) at 11/17/15 1454 Last data filed at 11/16/15 1800  Gross per 24 hour  Intake    530 ml  Output      0 ml  Net    530 ml    Review of Systems  Constitutional: Negative for fever and chills.  HENT: Negative for congestion and hearing loss.   Eyes: Negative for blurred vision and double vision.  Respiratory: Positive for shortness of breath. Negative for cough, hemoptysis and wheezing.   Cardiovascular: Negative for chest pain and palpitations.  Gastrointestinal: Negative for heartburn, nausea and vomiting.  Genitourinary: Negative for dysuria.  Musculoskeletal: Negative for myalgias.  Skin: Negative for itching and rash.  Neurological: Negative for dizziness and headaches.  Endo/Heme/Allergies: Does not bruise/bleed easily.    Physical Exam  Constitutional: She is oriented to person, place, and time and well-developed, well-nourished, and in no distress.  HENT:  Head: Normocephalic and atraumatic.  Right Ear: External ear normal.  Left Ear: External ear normal.  Eyes: EOM are normal. Pupils are  equal, round, and reactive to light. Left eye exhibits no discharge.  Neck: Normal range of motion. Neck supple. No tracheal deviation present. No thyromegaly present.  Cardiovascular: Normal rate, regular rhythm, normal heart sounds and intact distal pulses.   No murmur heard. Pulmonary/Chest: No respiratory distress. She has no wheezes. She has no rales. She exhibits no tenderness.  Intermittent bipap and HFNC use, still with shallow BS, no significant wheezes noted today.    Abdominal: Soft. Bowel sounds are normal. She exhibits no distension. There is no tenderness.  Musculoskeletal: Normal range of motion. She exhibits edema.  Trace R leg edema (chronic).   Neurological: She is alert and oriented to person, place, and time.  Skin: Skin is warm and dry.  Psychiatric: Affect normal.  Nursing note and vitals reviewed.    LABS:  CBC  Recent Labs Lab 11/14/15 0448 11/15/15 0907 11/16/15 0431  WBC 12.5* 12.5* 8.3  HGB 9.4* 8.9* 9.1*  HCT 29.2* 28.2* 28.1*  PLT 339 345 343   Coag's No results for input(s): APTT, INR in the last 168 hours. BMET  Recent Labs Lab 11/15/15 1442 11/16/15 0431 11/17/15 0404  NA 131* 132* 133*  K 4.4 3.6 3.7  CL 86* 82* 81*  CO2 40* 42* 44*  BUN 16 21* 26*  CREATININE 0.70 0.50 0.59  GLUCOSE 185* 124* 134*   Electrolytes  Recent Labs Lab 11/15/15 1442 11/16/15 0431 11/17/15 0404  CALCIUM 8.4* 8.5* 8.8*   Sepsis  Markers No results for input(s): LATICACIDVEN, PROCALCITON, O2SATVEN in the last 168 hours. ABG  Recent Labs Lab 11/15/15 1120  PHART 7.48*  PCO2ART 61*  PO2ART 77*   Liver Enzymes  Recent Labs Lab 11/13/15 1809  AST 20  ALT 15  ALKPHOS 99  BILITOT 0.9  ALBUMIN 3.7   Cardiac Enzymes  Recent Labs Lab 11/13/15 1809  TROPONINI 0.05*   Glucose  Recent Labs Lab 11/16/15 0739 11/16/15 1228 11/16/15 1700 11/16/15 2126 11/17/15 0724 11/17/15 1131  GLUCAP 135* 125* 193* 145* 123* 114*     Imaging No results found. Cultures: BCx2  UC  Sputum 1/23>>  Antibiotics: Meropenem 1/22>>  Lines:  ASSESSMENT / PLAN:  82 year old female past medical history of COPD, diabetes, hypertension, admitted for acute COPD exacerbation, transerred to med unit 1/23, transferred back to ICU on 1/24 due to respiratory distress and unable to wean off bipap. Now doing well, however, anxiety seems to be a component driving her respiratory status.   AECOPD - Prednisone daily 5 days total, stop date entered -Continue with meropenem (5 days total) -Continue with the scheduled nebulizers -Sputum culture -Maintain O2 saturations greater than 88%, may continue to use HFNC -Chest physiotherapy, Acapella -BiPAP daily at bedtime 3 nights, minimal 4-6 hours per night, when necessary BiPAP during the day with naps -When necessary nebulizers -Patient states she wears 2 L of O2 as needed at home, will continue -Primary pulmonologist Dr. Welton Flakes -Chest x-ray with no acute infiltrates, however does have productive greenish sputum.   Chronic diastolic failure -Continue with Bumex and Aldactone -Monitor I's and O's -Monitor blood pressure  Hypertension -Monitor hemodynamics -PRN Hydralazine for SBP >  Severe Reflux and Large Hiatal Hernia - cont with BID PPI - the combination of these two after her barium swallow may have caused some silent reflux/aspiration and triggered her COPD.   Anxiety - restarted on Xanax  I have personally obtained a history, examined the patient, evaluated laboratory and imaging results, formulated the assessment and plan and placed orders. Pulmonary Time devoted to patient care services described in this note is 35 minutes.    Stephanie Acre, MD [facility] Pager [phone] (please enter 7-digits) On Call Pager - [phone] (please enter 7-digits)  Note: This note was prepared with Dragon dictation along with smaller  phrase technology. Any transcriptional errors that result from this process are unintentional.

## 2015-11-17 NOTE — Progress Notes (Addendum)
ANTIBIOTIC CONSULT NOTE - Follow-up  Pharmacy Consult for Meropenem (4/5) & Electrolyte Monitoring Indication: pneumonia  Allergies  Allergen Reactions  . Atorvastatin   . Clindamycin Diarrhea, Other (See Comments) and Nausea Only  . Codeine   . Doxycycline   . Epinephrine Hives and Itching  . Erythromycin   . Fluticasone-Salmeterol   . Lasix [Furosemide] Itching  . Levofloxacin   . Penicillins   . Propoxyphene Hcl   . Rosuvastatin   . Simvastatin   . Singlet [Chlorphen-Pe-Acetaminophen] Other (See Comments)    Reaction: Wheezing  . Sulfamethoxazole-Trimethoprim   . Teriparatide   . Tetracyclines & Related Other (See Comments)    unknown  . Cefaclor Rash and Other (See Comments)    Reaction: Delirious    . Glipizide Hives  . Lisinopril Hives and Itching    Patient Measurements: Height: 5' (152.4 cm) Weight: 156 lb 15.5 oz (71.2 kg) IBW/kg (Calculated) : 45.5   Vital Signs: BP: 142/45 mmHg (01/26 0700) Pulse Rate: 56 (01/26 0700) Intake/Output from previous day: 01/25 0701 - 01/26 0700 In: 1730 [P.O.:1680; IV Piggyback:50] Out: -  Intake/Output from this shift:    Labs:  Recent Labs  11/15/15 0907 11/15/15 1442 11/16/15 0431 11/17/15 0404  WBC 12.5*  --  8.3  --   HGB 8.9*  --  9.1*  --   PLT 345  --  343  --   CREATININE 0.45 0.70 0.50 0.59   Estimated Creatinine Clearance: 45.3 mL/min (by C-G formula based on Cr of 0.59). No results for input(s): VANCOTROUGH, VANCOPEAK, VANCORANDOM, GENTTROUGH, GENTPEAK, GENTRANDOM, TOBRATROUGH, TOBRAPEAK, TOBRARND, AMIKACINPEAK, AMIKACINTROU, AMIKACIN in the last 72 hours.   Microbiology: Recent Results (from the past 720 hour(s))  MRSA PCR Screening     Status: None   Collection Time: 11/13/15  5:28 PM  Result Value Ref Range Status   MRSA by PCR NEGATIVE NEGATIVE Final    Comment:        The GeneXpert MRSA Assay (FDA approved for NASAL specimens only), is one component of a comprehensive MRSA  colonization surveillance program. It is not intended to diagnose MRSA infection nor to guide or monitor treatment for MRSA infections.     Medical History: Past Medical History  Diagnosis Date  . Essential hypertension   . Diabetes mellitus type II, controlled (HCC)   . COPD (chronic obstructive pulmonary disease) (HCC)   . Osteoporosis   . Carotid arterial disease (HCC)     a. 2012 Carotid dopplers: 40-59% R ICA, 60-79% L ICA  . Tic douloureux   . Arthralgia   . Sjoegren syndrome (HCC)   . TIA (transient ischemic attack)   . Emphysema   . MVP (mitral valve prolapse)   . Chronic diastolic CHF (congestive heart failure) (HCC)     a. 08/2011 Echo: EF 65-70%, mild LVH, mod dil LA, mildly dil RA, mild MR, mild-mod AoV Sclerosis w/o stenosis, mod-sev elev PA pressures, mild TR.  Marland Kitchen Acid reflux   . Diverticulitis   . Anemia   . History of pneumonia   . Syncope and collapse   . Anemia   . Hip fracture, left (HCC)   . Hyperlipidemia   . Hiatal hernia   . Chest pain     a. 08/2011 MV: EF 74%, no ischemia.  . Noncompliance     a. h/o not taking bumex as Rx.  Marland Kitchen PAF (paroxysmal atrial fibrillation) (HCC)     a. CHA2DS2VASc = 8 ->No OAC 2/2 falls.  Medications:  Prescriptions prior to admission  Medication Sig Dispense Refill Last Dose  . albuterol (PROAIR HFA) 108 (90 BASE) MCG/ACT inhaler Inhale 2 puffs into the lungs every 6 (six) hours as needed. 3 Inhaler 0 prn  . albuterol (PROVENTIL) (2.5 MG/3ML) 0.083% nebulizer solution Take 2.5 mg by nebulization every 6 (six) hours as needed for wheezing or shortness of breath.    11/13/2015 at Unknown time  . aspirin EC 81 MG tablet Take 81 mg by mouth daily.   11/12/2015 at Unknown time  . beta carotene w/minerals (OCUVITE) tablet Take 1 tablet by mouth 2 (two) times daily.   11/12/2015 at Unknown time  . bumetanide (BUMEX) 2 MG tablet Take 1 tablet (2 mg total) by mouth 2 (two) times daily as needed. Take 1 tab daily (Patient taking  differently: Take 2 mg by mouth 2 (two) times daily. Take 1 tab daily) 60 tablet 11 11/13/2015 at Unknown time  . calcium carbonate (OS-CAL) 600 MG TABS tablet Take 600 mg by mouth daily with breakfast.   11/12/2015 at Unknown time  . chlorhexidine (PERIDEX) 0.12 % solution by Mouth Rinse route 2 (two) times daily. Rinse and spit 2 times a day for 30 seconds.  12 11/12/2015 at Unknown time  . citalopram (CELEXA) 20 MG tablet Take 20 mg by mouth daily with lunch.    11/12/2015 at Unknown time  . diphenhydramine-acetaminophen (TYLENOL PM) 25-500 MG TABS Take 1 tablet by mouth at bedtime as needed (for sleep.).    11/12/2015 at Unknown time  . doxazosin (CARDURA) 4 MG tablet Take 4 mg by mouth at bedtime.    11/12/2015 at Unknown time  . esomeprazole (NEXIUM) 40 MG capsule Take 40 mg by mouth daily before breakfast.   11/13/2015 at Unknown time  . ezetimibe (ZETIA) 10 MG tablet Take 10 mg by mouth at bedtime.    11/12/2015 at Unknown time  . fluticasone (FLONASE) 50 MCG/ACT nasal spray Place 1-2 sprays into both nostrils at bedtime.    11/12/2015 at Unknown time  . gabapentin (NEURONTIN) 100 MG capsule Take 100 mg by mouth 2 (two) times daily.    11/13/2015 at Unknown time  . HYDROcodone-acetaminophen (NORCO/VICODIN) 5-325 MG tablet Take 1 tablet by mouth See admin instructions. Take 1 tablet by mouth 2 times a day. Take 1 tablet by mouth during the day as needed for pain.   11/13/2015 at Unknown time  . metoCLOPramide (REGLAN) 5 MG tablet Take 5 mg by mouth 2 (two) times daily. Take before breakfast and supper.   11/13/2015 at Unknown time  . mometasone-formoterol (DULERA) 100-5 MCG/ACT AERO Inhale 2 puffs into the lungs 2 (two) times daily.    11/13/2015 at Unknown time  . Multiple Vitamin (MULTIVITAMIN) tablet Take 1 tablet by mouth every other day.    11/13/2015 at Unknown time  . mupirocin ointment (BACTROBAN) 2 % Apply 1 application topically daily as needed (for sore on leg.).    prn  . nebivolol (BYSTOLIC) 5  MG tablet Take 1 tablet (5 mg total) by mouth daily. 30 tablet 0 11/13/2015 at Unknown time  . nitroGLYCERIN (NITROSTAT) 0.4 MG SL tablet PLACE 1 TABLET (0.4 MG TOTAL) UNDER THE TONGUE EVERY 5 (FIVE) MINUTES AS NEEDED FOR CHEST PAIN. 25 tablet 3 Past Week at Unknown time  . potassium chloride (K-DUR,KLOR-CON) 10 MEQ tablet Take 0.5 tablets (5 mEq total) by mouth 2 (two) times daily as needed. Patient takes this when she takes Bumex. (Patient taking differently: Take  5 mEq by mouth 2 (two) times daily. Patient takes this when she takes Bumex.) 60 tablet 3 11/13/2015 at Unknown time  . promethazine (PHENERGAN) 25 MG tablet Take 25 mg by mouth every 8 (eight) hours as needed. For nausea and vomiting.  0 prn  . SPIRIVA RESPIMAT 1.25 MCG/ACT AERS Inhale 1 puff into the lungs daily.  5 11/13/2015 at Unknown time  . spironolactone (ALDACTONE) 50 MG tablet Take 25 mg by mouth every Monday, Wednesday, and Friday. Takes 1 tablet Mon. Wednesday and Friday.   11/09/2015  . arformoterol (BROVANA) 15 MCG/2ML NEBU Take 2 mLs (15 mcg total) by nebulization 2 (two) times daily. 120 mL 4 Taking  . chlorthalidone (HYGROTON) 25 MG tablet Take 1 tablet (25 mg total) by mouth daily as needed. 30 tablet 6 Taking  . tiotropium (SPIRIVA) 18 MCG inhalation capsule Place 1 capsule (18 mcg total) into inhaler and inhale daily. 90 capsule 0    Assessment: Pharmacy consulted to dose meropenem for 82 year old female admitted with CAP, multiple drug allergies.  Plan:  Will continue meropenem at  IV q12hr.   Electrolytes within normal limits. Will order follow-up BMP for am on 1/28.    Pharmacy will continue to follow renal function and culture results.    Vanshika Jastrzebski L 11/17/2015,3:06 PM

## 2015-11-17 NOTE — Progress Notes (Signed)
[facility] - [place] at [facility]   PATIENT NAME: Catherine Holmes    MR#:  [id number]  DATE OF BIRTH:  Jul 31, 1928  SUBJECTIVE:  CHIEF COMPLAINT:   Chief Complaint  Patient presents with  . Shortness of Breath     Came with respi distress, there was requirement of bipap, but then was on nasal canul aoxygen- transferred to floor- again due to respi distress- transferred back to ICU, now on HFNC 40% this morning.   REVIEW OF SYSTEMS:  CONSTITUTIONAL: No fever, fatigue or weakness.  EYES: No blurred or double vision.  EARS, NOSE, AND THROAT: No tinnitus or ear pain.  RESPIRATORY: positive for cough, shortness of breath, wheezing but no hemoptysis.  CARDIOVASCULAR: No chest pain, orthopnea, edema.  GASTROINTESTINAL: No nausea, vomiting, diarrhea or abdominal pain.  GENITOURINARY: No dysuria, hematuria.  ENDOCRINE: No polyuria, nocturia,  HEMATOLOGY: No anemia, easy bruising or bleeding SKIN: No rash or lesion. MUSCULOSKELETAL: No joint pain or arthritis.   NEUROLOGIC: No tingling, numbness, weakness.  PSYCHIATRY: No anxiety or depression.   ROS  DRUG ALLERGIES:   Allergies  Allergen Reactions  . Atorvastatin   . Clindamycin Diarrhea, Other (See Comments) and Nausea Only  . Codeine   . Doxycycline   . Epinephrine Hives and Itching  . Erythromycin   . Fluticasone-Salmeterol   . Lasix [Furosemide] Itching  . Levofloxacin   . Penicillins   . Propoxyphene Hcl   . Rosuvastatin   . Simvastatin   . Singlet [Chlorphen-Pe-Acetaminophen] Other (See Comments)    Reaction: Wheezing  . Sulfamethoxazole-Trimethoprim   . Teriparatide   . Tetracyclines & Related Other (See Comments)    unknown  . Cefaclor Rash and Other (See Comments)    Reaction: Delirious    . Glipizide Hives  . Lisinopril Hives and Itching    VITALS:  Blood pressure 142/45, pulse 56, temperature 98.2 F (36.8 C), temperature source Axillary, resp. rate 17, height 5' (1.524 m),  weight 71.2 kg (156 lb 15.5 oz), SpO2 99 %.  PHYSICAL EXAMINATION:  GENERAL:  82 y.o.-year-old patient lying in the bed with acute distress.appears anxious.  EYES: Pupils equal, round, reactive to light and accommodation. No scleral icterus. Extraocular muscles intact.  HEENT: Head atraumatic, normocephalic. Oropharynx and nasopharynx clear.  NECK:  Supple, no jugular venous distention. No thyroid enlargement, no tenderness.  LUNGS: Normal breath sounds bilaterally, mild wheezing, no rales,rhonchi or crepitation. On HFNC now. No use of accessory muscles of respiration. CARDIOVASCULAR: S1, S2 normal. No murmurs, rubs, or gallops.  ABDOMEN: Soft, nontender, nondistended. Bowel sounds present. No organomegaly or mass.  EXTREMITIES: No pedal edema, cyanosis, or clubbing.  NEUROLOGIC: Cranial nerves II through XII are intact. Muscle strength 5/5 in all extremities. Sensation intact. Gait not checked.  PSYCHIATRIC: The patient is alert and oriented x 3. Anxious now. SKIN: No obvious rash, lesion, or ulcer.   Physical Exam LABORATORY PANEL:   CBC  Recent Labs Lab 11/16/15 0431  WBC 8.3  HGB 9.1*  HCT 28.1*  PLT 343   ------------------------------------------------------------------------------------------------------------------  Chemistries   Recent Labs Lab 11/13/15 1809  11/17/15 0404  NA 136  < > 133*  K 3.5  < > 3.7  CL 86*  < > 81*  CO2 37*  < > 44*  GLUCOSE 182*  < > 134*  BUN 15  < > 26*  CREATININE 0.57  < > 0.59  CALCIUM 8.4*  < > 8.8*  AST 20  --   --  ALT 15  --   --   ALKPHOS 99  --   --   BILITOT 0.9  --   --   < > = values in this interval not displayed. ------------------------------------------------------------------------------------------------------------------  Cardiac Enzymes  Recent Labs Lab 11/13/15 1809  TROPONINI 0.05*    ------------------------------------------------------------------------------------------------------------------  RADIOLOGY:  Dg Chest Port 1 View  11/15/2015  CLINICAL DATA:  Respiratory failure EXAM: PORTABLE CHEST 1 VIEW COMPARISON:  11/13/2015 FINDINGS: Cardiomediastinal silhouette is stable. Mild hyperinflation and mild emphysematous changes especially upper lobes again noted. Stable atelectasis or scarring in right lower lobe laterally. No segmental infiltrate or pulmonary edema. IMPRESSION: Mild hyperinflation and mild emphysematous changes especially upper lobes again noted. Stable atelectasis or scarring in right lower lobe laterally. No segmental infiltrate or pulmonary edema. Electronically Signed   By: Natasha Mead M.D.   On: 11/15/2015 10:17    ASSESSMENT AND PLAN:   Active Problems:   COPD exacerbation (HCC)   Acute on chronic respiratory failure (HCC)   Pressure ulcer  * Ac on ch respi failure due to COPD exacerbation.   IV steroids, nebs, Abx. Meropenem.   Required Bipap, Unable to come off bipap.   Component of anxiety also there.   Keep on xanax as needed.   Transferred back to ICU .   Repeated x ray chest- no new findings.   Pulmonary on case.   Pt on meropenem,   Now on HFNC oxygen, taper as tolerated.   May need pulmonary rehab.  * Ch diastolic CHF   No exacerbation this time.   On diuretics.  * Hyperlipidemia   Cont Zetia.  * anxiety    Xanax.  * hypokalemia   Replace , recheck.   All the records are reviewed and case discussed with Care Management/Social Workerr. Management plans discussed with the patient, family and they are in agreement.  CODE STATUS: Full.  TOTAL TIME TAKING CARE OF THIS PATIENT: 35 critical care minutes.  Discussed with Dr. Dema Severin.  POSSIBLE D/C IN 1-2 DAYS, DEPENDING ON CLINICAL CONDITION.   Altamese Dilling M.D on 11/17/2015   Between 7am to 6pm - Pager - [phone]  After 6pm go to [email or web address] -  password [email or web address] [facility]  [place] [facility]  Office  [phone]  CC: Primary care physician; Marisue Ivan, MD  Note: This dictation was prepared with Dragon dictation along with smaller phrase technology. Any transcriptional errors that result from this process are unintentional.

## 2015-11-17 NOTE — Progress Notes (Signed)
RT removed patient from HFNC and placed on 4LPM [place] per verbal order from Dr. Dema Severin.  Patient tolerated change well, O2 sats remained 98%.  Will continue to monitor.

## 2015-11-17 NOTE — Care Management (Signed)
Spoke with patient regarding discharge plans and she is very firm in her statement that she will not be going to a skilled facility.  She is very agreeable to home health services and has a long term policy for additional in home support.  She and her daughter live together and patient does have chronic home 74.  Currently still requiring HFNC.  Patient may benefit from [place] nebulizer treatments at home.

## 2015-11-17 NOTE — Progress Notes (Signed)
RT removed patient from Bipap and placed her on HFNC 40% at 40 LPM.  Patient tolerated changed well and sats remained high 90's.  Will continue to monitor.

## 2015-11-18 LAB — CBC
HCT: 29.9 % — ABNORMAL LOW (ref 35.0–47.0)
HEMOGLOBIN: 9.8 g/dL — AB (ref 12.0–16.0)
MCH: 27.6 pg (ref 26.0–34.0)
MCHC: 32.9 g/dL (ref 32.0–36.0)
MCV: 84 fL (ref 80.0–100.0)
PLATELETS: 364 10*3/uL (ref 150–440)
RBC: 3.55 MIL/uL — ABNORMAL LOW (ref 3.80–5.20)
RDW: 17 % — AB (ref 11.5–14.5)
WBC: 8.8 10*3/uL (ref 3.6–11.0)

## 2015-11-18 LAB — BASIC METABOLIC PANEL
ANION GAP: 7 (ref 5–15)
BUN: 29 mg/dL — ABNORMAL HIGH (ref 6–20)
CALCIUM: 8.3 mg/dL — AB (ref 8.9–10.3)
CO2: 45 mmol/L — ABNORMAL HIGH (ref 22–32)
Chloride: 79 mmol/L — ABNORMAL LOW (ref 101–111)
Creatinine, Ser: 0.8 mg/dL (ref 0.44–1.00)
GLUCOSE: 123 mg/dL — AB (ref 65–99)
Potassium: 3.6 mmol/L (ref 3.5–5.1)
Sodium: 131 mmol/L — ABNORMAL LOW (ref 135–145)

## 2015-11-18 LAB — GLUCOSE, CAPILLARY
GLUCOSE-CAPILLARY: 120 mg/dL — AB (ref 65–99)
GLUCOSE-CAPILLARY: 153 mg/dL — AB (ref 65–99)
Glucose-Capillary: 293 mg/dL — ABNORMAL HIGH (ref 65–99)
Glucose-Capillary: 100 mg/dL — ABNORMAL HIGH (ref 65–99)

## 2015-11-18 MED ORDER — SALINE SPRAY 0.65 % NA SOLN: 1.0000 | NASAL | Status: DC | PRN

## 2015-11-18 MED ORDER — BUDESONIDE 0.5 MG/2ML IN SUSP
0.5000 mg | Freq: Two times a day (BID) | RESPIRATORY_TRACT | Status: DC
  Administered 2015-11-18 – 2015-11-21 (×6): 0.5 mg via RESPIRATORY_TRACT
  Filled 2015-11-18 (×7): qty 2

## 2015-11-18 MED ORDER — OCUVITE-LUTEIN PO CAPS
1.0000 | ORAL_CAPSULE | Freq: Two times a day (BID) | ORAL | Status: DC
  Administered 2015-11-18: 1 via ORAL
  Filled 2015-11-18 (×3): qty 1

## 2015-11-18 MED ORDER — PREDNISONE 20 MG PO TABS
40.0000 mg | ORAL_TABLET | Freq: Every day | ORAL | Status: DC
  Administered 2015-11-19 – 2015-11-21 (×3): 40 mg via ORAL
  Filled 2015-11-18 (×3): qty 2

## 2015-11-18 MED ORDER — GUAIFENESIN-DM 100-10 MG/5ML PO SYRP
5.0000 mL | ORAL_SOLUTION | ORAL | Status: DC | PRN
  Administered 2015-11-18 – 2015-11-20 (×5): 5 mL via ORAL
  Filled 2015-11-18 (×5): qty 5

## 2015-11-18 MED ORDER — MENTHOL 3 MG MT LOZG
1.0000 | LOZENGE | OROMUCOSAL | Status: DC | PRN
  Filled 2015-11-18: qty 9

## 2015-11-18 NOTE — Progress Notes (Signed)
[facility] - [place] at [facility]   PATIENT NAME: Catherine Holmes    MR#:  [id number]  DATE OF BIRTH:  1927-11-26  SUBJECTIVE:  CHIEF COMPLAINT:   Chief Complaint  Patient presents with  . Shortness of Breath     Came with respi distress, there was requirement of bipap, taper off, but had to go back to ICU again due to worsening, remains requiring bipap at night.   Was on HFNC, now on 3 ltr Oxygen, calm.   As per daughter- she have her son's first death anniversary in this month, so more anxious.  REVIEW OF SYSTEMS:  CONSTITUTIONAL: No fever, fatigue or weakness.  EYES: No blurred or double vision.  EARS, NOSE, AND THROAT: No tinnitus or ear pain.  RESPIRATORY: positive for cough, shortness of breath, wheezing but no hemoptysis.  CARDIOVASCULAR: No chest pain, orthopnea, edema.  GASTROINTESTINAL: No nausea, vomiting, diarrhea or abdominal pain.  GENITOURINARY: No dysuria, hematuria.  ENDOCRINE: No polyuria, nocturia,  HEMATOLOGY: No anemia, easy bruising or bleeding SKIN: No rash or lesion. MUSCULOSKELETAL: No joint pain or arthritis.   NEUROLOGIC: No tingling, numbness, weakness.  PSYCHIATRY: No anxiety or depression.   ROS  DRUG ALLERGIES:   Allergies  Allergen Reactions  . Atorvastatin   . Clindamycin Diarrhea, Other (See Comments) and Nausea Only  . Codeine   . Doxycycline   . Epinephrine Hives and Itching  . Erythromycin   . Fluticasone-Salmeterol   . Lasix [Furosemide] Itching  . Levofloxacin   . Penicillins   . Propoxyphene Hcl   . Rosuvastatin   . Simvastatin   . Singlet [Chlorphen-Pe-Acetaminophen] Other (See Comments)    Reaction: Wheezing  . Sulfamethoxazole-Trimethoprim   . Teriparatide   . Tetracyclines & Related Other (See Comments)    unknown  . Cefaclor Rash and Other (See Comments)    Reaction: Delirious    . Glipizide Hives  . Lisinopril Hives and Itching    VITALS:  Blood pressure 112/36, pulse 74, temperature  97.1 F (36.2 C), temperature source Oral, resp. rate 19, height 5' (1.524 m), weight 71.2 kg (156 lb 15.5 oz), SpO2 99 %.  PHYSICAL EXAMINATION:  GENERAL:  82 y.o.-year-old patient lying in the bed with no acute distress.appears calm today.  EYES: Pupils equal, round, reactive to light and accommodation. No scleral icterus. Extraocular muscles intact.  HEENT: Head atraumatic, normocephalic. Oropharynx and nasopharynx clear.  NECK:  Supple, no jugular venous distention. No thyroid enlargement, no tenderness.  LUNGS: Normal breath sounds bilaterally, mild wheezing, no rales,rhonchi or crepitation. On [place] now. No use of accessory muscles of respiration. CARDIOVASCULAR: S1, S2 normal. No murmurs, rubs, or gallops.  ABDOMEN: Soft, nontender, nondistended. Bowel sounds present. No organomegaly or mass.  EXTREMITIES: No pedal edema, cyanosis, or clubbing.  NEUROLOGIC: Cranial nerves II through XII are intact. Muscle strength 5/5 in all extremities. Sensation intact. Gait not checked.  PSYCHIATRIC: The patient is alert and oriented x 3.  SKIN: No obvious rash, lesion, or ulcer.   Physical Exam LABORATORY PANEL:   CBC  Recent Labs Lab 11/16/15 0431  WBC 8.3  HGB 9.1*  HCT 28.1*  PLT 343   ------------------------------------------------------------------------------------------------------------------  Chemistries   Recent Labs Lab 11/13/15 1809  11/17/15 0404  NA 136  < > 133*  K 3.5  < > 3.7  CL 86*  < > 81*  CO2 37*  < > 44*  GLUCOSE 182*  < > 134*  BUN 15  < > 26*  CREATININE 0.57  < > 0.59  CALCIUM 8.4*  < > 8.8*  AST 20  --   --   ALT 15  --   --   ALKPHOS 99  --   --   BILITOT 0.9  --   --   < > = values in this interval not displayed. ------------------------------------------------------------------------------------------------------------------  Cardiac Enzymes  Recent Labs Lab 11/13/15 1809  TROPONINI 0.05*    ------------------------------------------------------------------------------------------------------------------  RADIOLOGY:  No results found.  ASSESSMENT AND PLAN:   Active Problems:   COPD exacerbation (HCC)   Acute on chronic respiratory failure (HCC)   Pressure ulcer  * Ac on ch respi failure due to COPD exacerbation.   IV steroids, nebs, Abx. Meropenem.   Required Bipap,    Component of anxiety also there.   Keep on xanax as needed.   Transferred back to ICU .   Repeated x ray chest- no new findings.   Pulmonary on case.    Now on HFNC oxygen, now on nasal canula 3 ltr.   Monitor in ICU tonight.   She may need Cpap or trilogy at night at home.   May need pulmonary rehab.  * Ch diastolic CHF   No exacerbation this time.   On diuretics.  * Hyperlipidemia   Cont Zetia.  * anxiety    Xanax.    Cont Citalopram.  * hypokalemia   Replace , recheck.   All the records are reviewed and case discussed with Care Management/Social Workerr. Management plans discussed with the patient, family and they are in agreement.  CODE STATUS: Full.  TOTAL TIME TAKING CARE OF THIS PATIENT: 35 minutes.  Discussed with pt's daughter.  POSSIBLE D/C IN 1-2 DAYS, DEPENDING ON CLINICAL CONDITION.   Altamese Dilling M.D on 11/18/2015   Between 7am to 6pm - Pager - [phone]  After 6pm go to [email or web address] - password [email or web address] [facility]  [place] [facility]  Office  [phone]  CC: Primary care physician; Marisue Ivan, MD  Note: This dictation was prepared with Dragon dictation along with smaller phrase technology. Any transcriptional errors that result from this process are unintentional.

## 2015-11-18 NOTE — Progress Notes (Signed)
[facility] - [place] at [facility]   PATIENT NAME: Catherine Holmes    MR#:  [id number]  DATE OF BIRTH:  1927-12-18  SUBJECTIVE:  CHIEF COMPLAINT:   Chief Complaint  Patient presents with  . Shortness of Breath     Came with respiratory distress, there was requirement of bipap, tapered off, but had to go back to ICU again due to worsening, remains requiring bipap at night.   Was on HFNC, now on 3 ltr Oxygen.   REVIEW OF SYSTEMS:  CONSTITUTIONAL: No fever, fatigue or weakness.  EYES: No blurred or double vision.  EARS, NOSE, AND THROAT: No tinnitus or ear pain.  RESPIRATORY: positive for cough, shortness of breath, wheezing but no hemoptysis.  CARDIOVASCULAR: No chest pain, orthopnea, edema.  GASTROINTESTINAL: No nausea, vomiting, diarrhea or abdominal pain.  GENITOURINARY: No dysuria, hematuria.  ENDOCRINE: No polyuria, nocturia,  HEMATOLOGY: No anemia, easy bruising or bleeding SKIN: No rash or lesion. MUSCULOSKELETAL: No joint pain or arthritis.   NEUROLOGIC: No tingling, numbness, weakness.  PSYCHIATRY: No anxiety or depression.   ROS  DRUG ALLERGIES:   Allergies  Allergen Reactions  . Atorvastatin   . Clindamycin Diarrhea, Other (See Comments) and Nausea Only  . Codeine   . Doxycycline   . Epinephrine Hives and Itching  . Erythromycin   . Fluticasone-Salmeterol   . Lasix [Furosemide] Itching  . Levofloxacin   . Penicillins   . Propoxyphene Hcl   . Rosuvastatin   . Simvastatin   . Singlet [Chlorphen-Pe-Acetaminophen] Other (See Comments)    Reaction: Wheezing  . Sulfamethoxazole-Trimethoprim   . Teriparatide   . Tetracyclines & Related Other (See Comments)    unknown  . Cefaclor Rash and Other (See Comments)    Reaction: Delirious    . Glipizide Hives  . Lisinopril Hives and Itching    VITALS:  Blood pressure 133/49, pulse 69, temperature 97.3 F (36.3 C), temperature source Axillary, resp. rate 18, height 5' (1.524 m), weight  71.5 kg (157 lb 10.1 oz), SpO2 98 %.  PHYSICAL EXAMINATION:  GENERAL:  82 y.o.-year-old patient lying in the bed with no acute distress. Anxious. EYES: Pupils equal, round, reactive to light and accommodation. No scleral icterus. Extraocular muscles intact.  HEENT: Head atraumatic, normocephalic. Oropharynx and nasopharynx clear.  NECK:  Supple, no jugular venous distention. No thyroid enlargement, no tenderness.  LUNGS:  mild wheezing, no rales,rhonchi or crepitation. On [place] now. CARDIOVASCULAR: S1, S2 normal. No murmurs, rubs, or gallops.  ABDOMEN: Soft, nontender, nondistended. Bowel sounds present. No organomegaly or mass.  EXTREMITIES: No pedal edema, cyanosis, or clubbing.  NEUROLOGIC: Cranial nerves II through XII are intact. Muscle strength 5/5 in all extremities. Sensation intact. Gait not checked.  PSYCHIATRIC: The patient is alert and oriented x 3.  SKIN: No obvious rash, lesion, or ulcer.   Physical Exam LABORATORY PANEL:   CBC  Recent Labs Lab 11/18/15 0444  WBC 8.8  HGB 9.8*  HCT 29.9*  PLT 364   ------------------------------------------------------------------------------------------------------------------  Chemistries   Recent Labs Lab 11/13/15 1809  11/18/15 0444  NA 136  < > 131*  K 3.5  < > 3.6  CL 86*  < > 79*  CO2 37*  < > 45*  GLUCOSE 182*  < > 123*  BUN 15  < > 29*  CREATININE 0.57  < > 0.80  CALCIUM 8.4*  < > 8.3*  AST 20  --   --   ALT 15  --   --  ALKPHOS 99  --   --   BILITOT 0.9  --   --   < > = values in this interval not displayed. ------------------------------------------------------------------------------------------------------------------  Cardiac Enzymes  Recent Labs Lab 11/13/15 1809  TROPONINI 0.05*   ------------------------------------------------------------------------------------------------------------------  RADIOLOGY:  No results found.  ASSESSMENT AND PLAN:   Active Problems:   COPD exacerbation  (HCC)   Acute on chronic respiratory failure (HCC)   Pressure ulcer  * Acute on ch respiratory failure due to COPD exacerbation   IV steroids, nebs, Abx. Meropenem x 5 days   Required Bipap and now on [place]   Repeated x ray chest- no new findings.   Pulmonary rehab after discharge Discussed with Dr. Dema Severin  * Chromic diastolic CHF   No exacerbation this time.   On diuretics.  * Hyperlipidemia   Cont Zetia.  * Anxiety    Xanax.    Cont Citalopram.   All the records are reviewed and case discussed with Care Management/Social Workerr. Management plans discussed with the patient, family and they are in agreement.  CODE STATUS: Full.  TOTAL TIME TAKING CARE OF THIS PATIENT: 35 minutes.   POSSIBLE D/C IN 1-2 DAYS, DEPENDING ON CLINICAL CONDITION.   Milagros Loll R M.D on 11/18/2015   Between 7am to 6pm - Pager - [phone]  After 6pm go to [email or web address] - password [email or web address] [facility]  [place]  [facility]  Office  [phone]  CC: Primary care physician; Marisue Ivan, MD  Note: This dictation was prepared with Dragon dictation along with smaller phrase technology. Any transcriptional errors that result from this process are unintentional.

## 2015-11-18 NOTE — Progress Notes (Signed)
Nutrition Follow-up    INTERVENTION:   Meals and Snacks: Cater to patient preferences, recommend smaller,more frequent meals Medical Food Supplement Therapy: recommend continuing Ensure   NUTRITION DIAGNOSIS:   Increased nutrient needs related to wound healing as evidenced by estimated needs.  GOAL:   Patient will meet greater than or equal to 90% of their needs  MONITOR:    (Energy Intake, electrolyte and renal Profile, Skin, Anthropometrics, Digestive System)  REASON FOR ASSESSMENT:    (Pressure Ulcer)    ASSESSMENT:   Pt admitted with COPD exacerbation, with intermittent bipap, currently on 5L [place].  Pt alert, on [place]  Diet Order:  Diet Heart Room service appropriate?: Yes; Fluid consistency:: Thin   Energy Intake: recorded po intake 53% of meals on average; pt reports she is eating mostly jello and mashed potatoes but Enrique Sack RN reports pt also ate her chicken salad sandwich at lunch today (despite the fact that pt reports she could not eat her chicken salad today because it was full of flakes of plastic); pt reports she cannot tolerate the Ensure but RN reports she drank of some of it today  Skin:   (Stage II buttock pressure ulcer)  Electrolyte and Renal Profile:  Recent Labs Lab 11/16/15 0431 11/17/15 0404 11/18/15 0444  BUN 21* 26* 29*  CREATININE 0.50 0.59 0.80  NA 132* 133* 131*  K 3.6 3.7 3.6   Glucose Profile:  Recent Labs  11/17/15 2107 11/18/15 0725 11/18/15 1140  GLUCAP 270* 120* 153*   Meds: ss novolog, reglan, MVI, prednisone  Height:   Ht Readings from Last 1 Encounters:  11/15/15 5' (1.524 m)    Weight:   Wt Readings from Last 1 Encounters:  11/18/15 157 lb 10.1 oz (71.5 kg)    Filed Weights   11/16/15 0500 11/17/15 0500 11/18/15 0549  Weight: 159 lb 6.3 oz (72.3 kg) 156 lb 15.5 oz (71.2 kg) 157 lb 10.1 oz (71.5 kg)    BMI:  Body mass index is 30.78 kg/(m^2).  Estimated Nutritional Needs:   Kcal:  BEE: 1130kcals, TEE: (IF  1.1-1.3)(AF 1.2) 1492-1762kcals  Protein:  84-100g protein (1.2-1.5g/kg)  Fluid:  1750-2159mL of fluid (25-23mL/kg)  EDUCATION NEEDS:   No education needs identified at this time  LOW Care Level  Romelle Starcher MS, RD, LDN [phone] Pager  [phone] Weekend/On-Call Pager

## 2015-11-18 NOTE — Progress Notes (Signed)
RN has already removed patient from Bipap, patient tolerating 4LPM [place] well at this time, O2 sat 97%.  Will continue to monitor.

## 2015-11-18 NOTE — Progress Notes (Signed)
Catherine Holmes is alert with intermittent confusion. VSS. 4l [place]. Take meds whole one at a time. Incontinent of bowel and bladder. Orders to transfer off of ICU.

## 2015-11-18 NOTE — Plan of Care (Signed)
Problem: Discharge Progression Outcomes Goal: Other Discharge Outcomes/Goals Outcome: Progressing Pt tx from ICU today, no complaints of pain, no distress or discomfort noted, pt anxious at times chaplin to visit pt to talk and to keep pt company, pt to be moved to a room closer to the nurses station when room is ready

## 2015-11-18 NOTE — Progress Notes (Signed)
Physical therapy consulted. Transferred to room 127

## 2015-11-18 NOTE — Progress Notes (Signed)
Chaplain responded to call from unit.  Provided compassionate presence and prayer with patient.  Jefm Petty [phone]

## 2015-11-18 NOTE — Progress Notes (Signed)
PULMONARY / CRITICAL CARE MEDICINE   Name: Catherine Holmes MRN: [id number] DOB: 24-Apr-1928    ADMISSION DATE:  11/13/2015 Consulting physician - Dr. Elisabeth Pigeon  BRIEF HISTORY: 82 yo with PMHx of COPD, anxiety, HTN, DM, admitted for AECOPD required bipap.   SUBJECTIVE:  Doing well this today, still wants bipap or HFNC even though her O2 sats>88% and she is no longer wheezing STUDIES:   VITAL SIGNS: Temp:  [97.1 F (36.2 C)-98.5 F (36.9 C)] 98.5 F (36.9 C) (01/27 1300) Pulse Rate:  [61-85] 85 (01/27 1400) Resp:  [15-24] 22 (01/27 1400) BP: (93-138)/(34-59) 107/53 mmHg (01/27 1400) SpO2:  [86 %-100 %] 98 % (01/27 1400) FiO2 (%):  [36 %] 36 % (01/27 1448) Weight:  [157 lb 10.1 oz (71.5 kg)] 157 lb 10.1 oz (71.5 kg) (01/27 0549) HEMODYNAMICS:   VENTILATOR SETTINGS: Vent Mode:  [-]  FiO2 (%):  [36 %] 36 % INTAKE / OUTPUT:  Intake/Output Summary (Last 24 hours) at 11/18/15 1523 Last data filed at 11/18/15 1330  Gross per 24 hour  Intake    285 ml  Output      0 ml  Net    285 ml    Review of Systems  Constitutional: Negative for fever and chills.  HENT: Negative for congestion and hearing loss.   Eyes: Negative for blurred vision and double vision.  Respiratory: Negative for cough, hemoptysis and wheezing.        Sob improved.   Cardiovascular: Negative for chest pain and palpitations.  Gastrointestinal: Negative for heartburn, nausea and vomiting.  Genitourinary: Negative for dysuria.  Musculoskeletal: Negative for myalgias.  Skin: Negative for itching and rash.  Neurological: Negative for dizziness and headaches.  Endo/Heme/Allergies: Does not bruise/bleed easily.    Physical Exam  Constitutional: She is oriented to person, place, and time and well-developed, well-nourished, and in no distress.  HENT:  Head: Normocephalic and atraumatic.  Right Ear: External ear normal.  Left Ear: External ear normal.  Eyes: EOM are normal. Pupils are equal, round, and reactive  to light. Left eye exhibits no discharge.  Neck: Normal range of motion. Neck supple. No tracheal deviation present. No thyromegaly present.  Cardiovascular: Normal rate, regular rhythm, normal heart sounds and intact distal pulses.   No murmur heard. Pulmonary/Chest: No respiratory distress. She has no wheezes. She has no rales. She exhibits no tenderness.  Intermittent bipap   Abdominal: Soft. Bowel sounds are normal. She exhibits no distension. There is no tenderness.  Musculoskeletal: Normal range of motion. She exhibits edema.  Trace R leg edema (chronic).   Neurological: She is alert and oriented to person, place, and time.  Skin: Skin is warm and dry.  Psychiatric: Affect normal.  Nursing note and vitals reviewed.    LABS:  CBC  Recent Labs Lab 11/15/15 0907 11/16/15 0431 11/18/15 0444  WBC 12.5* 8.3 8.8  HGB 8.9* 9.1* 9.8*  HCT 28.2* 28.1* 29.9*  PLT 345 343 364   Coag's No results for input(s): APTT, INR in the last 168 hours. BMET  Recent Labs Lab 11/16/15 0431 11/17/15 0404 11/18/15 0444  NA 132* 133* 131*  K 3.6 3.7 3.6  CL 82* 81* 79*  CO2 42* 44* 45*  BUN 21* 26* 29*  CREATININE 0.50 0.59 0.80  GLUCOSE 124* 134* 123*   Electrolytes  Recent Labs Lab 11/16/15 0431 11/17/15 0404 11/18/15 0444  CALCIUM 8.5* 8.8* 8.3*   Sepsis Markers No results for input(s): LATICACIDVEN, PROCALCITON, O2SATVEN in the last  168 hours. ABG  Recent Labs Lab 11/15/15 1120  PHART 7.48*  PCO2ART 61*  PO2ART 77*   Liver Enzymes  Recent Labs Lab 11/13/15 1809  AST 20  ALT 15  ALKPHOS 99  BILITOT 0.9  ALBUMIN 3.7   Cardiac Enzymes  Recent Labs Lab 11/13/15 1809  TROPONINI 0.05*   Glucose  Recent Labs Lab 11/17/15 0724 11/17/15 1131 11/17/15 1802 11/17/15 2107 11/18/15 0725 11/18/15 1140  GLUCAP 123* 114* 220* 270* 120* 153*    Imaging No results found. Cultures: BCx2  UC  Sputum 1/23>>  Antibiotics: Meropenem  1/22>>  Lines:  ASSESSMENT / PLAN:  82 year old female past medical history of COPD, diabetes, hypertension, admitted for acute COPD exacerbation, transerred to med unit 1/23, transferred back to ICU on 1/24 due to respiratory distress and unable to wean off bipap. Now doing well, however, anxiety seems to be a component driving her respiratory status.   AECOPD - Prednisone daily 5 days total, stop date entered -Continue with meropenem (5 days total) -Continue with the scheduled nebulizers -Sputum culture -Maintain O2 saturations greater than 88% -Chest physiotherapy, Acapella -BiPAP daily at bedtime, minimal 4-6 hours per night, when necessary BiPAP during the day with naps.  INPATIENT order only, does not this at home, does not need to be discharged on this order.  -When necessary nebulizers -Patient states she wears 2 L of O2 as needed at home, will continue -Primary pulmonologist Dr. Welton Flakes -Chest x-ray with no acute infiltrates, however does have productive greenish sputum. - doing well today, transition to [place] this AM, will plan to transfer to medical floor  Chronic diastolic failure -Continue with Bumex and Aldactone -Monitor I's and O's -Monitor blood pressure  Hypertension -Monitor hemodynamics -PRN Hydralazine for SBP >  Severe Reflux and Large Hiatal Hernia - cont with BID PPI - the combination of these two after her barium swallow may have caused some silent reflux/aspiration and triggered her COPD.   Anxiety - restarted on Xanax  I have personally obtained a history, examined the patient, evaluated laboratory and imaging results, formulated the assessment and plan and placed orders. Pulmonary Time devoted to patient care services described in this note is 35 minutes.    Stephanie Acre, MD [facility] Pager [phone] (please enter 7-digits) On Call Pager - [phone] (please enter 7-digits)  Note: This note was prepared  with Dragon dictation along with smaller phrase technology. Any transcriptional errors that result from this process are unintentional.

## 2015-11-18 NOTE — Care Management (Signed)
Patient on bipap- continuous.  She is full code.  Have left message for attending regarding possibility of palliative care consult.   Will investigate appropriateness of ltach consideration.   Will not discuss with patient until determine if patient is appropriate

## 2015-11-19 LAB — BASIC METABOLIC PANEL
Anion gap: 8 (ref 5–15)
BUN: 25 mg/dL — ABNORMAL HIGH (ref 6–20)
CALCIUM: 8.6 mg/dL — AB (ref 8.9–10.3)
CO2: 42 mmol/L — AB (ref 22–32)
CREATININE: 0.6 mg/dL (ref 0.44–1.00)
Chloride: 79 mmol/L — ABNORMAL LOW (ref 101–111)
GFR calc non Af Amer: >60 mL/min (ref >60)
Glucose, Bld: 136 mg/dL — ABNORMAL HIGH (ref 65–99)
Potassium: 3.5 mmol/L (ref 3.5–5.1)
Sodium: 129 mmol/L — ABNORMAL LOW (ref 135–145)

## 2015-11-19 LAB — GLUCOSE, CAPILLARY
GLUCOSE-CAPILLARY: 163 mg/dL — AB (ref 65–99)
Glucose-Capillary: 143 mg/dL — ABNORMAL HIGH (ref 65–99)
Glucose-Capillary: 301 mg/dL — ABNORMAL HIGH (ref 65–99)
Glucose-Capillary: 238 mg/dL — ABNORMAL HIGH (ref 65–99)

## 2015-11-19 MED ORDER — BUMETANIDE 1 MG PO TABS
1.0000 mg | ORAL_TABLET | Freq: Once | ORAL | Status: AC
  Administered 2015-11-19: 14:00:00 1 mg via ORAL
  Filled 2015-11-19: qty 1

## 2015-11-19 MED ORDER — BUMETANIDE 2 MG PO TABS
2.0000 mg | ORAL_TABLET | Freq: Two times a day (BID) | ORAL | Status: DC
  Administered 2015-11-19: 17:00:00 2 mg via ORAL
  Filled 2015-11-19 (×4): qty 1

## 2015-11-19 NOTE — Progress Notes (Signed)
Pharmacy Consult for Electrolyte Monitoring and Replacement  Allergies  Allergen Reactions  . Atorvastatin   . Clindamycin Diarrhea, Other (See Comments) and Nausea Only  . Codeine   . Doxycycline   . Epinephrine Hives and Itching  . Erythromycin   . Fluticasone-Salmeterol   . Lasix [Furosemide] Itching  . Levofloxacin   . Penicillins   . Propoxyphene Hcl   . Rosuvastatin   . Simvastatin   . Singlet [Chlorphen-Pe-Acetaminophen] Other (See Comments)    Reaction: Wheezing  . Sulfamethoxazole-Trimethoprim   . Teriparatide   . Tetracyclines & Related Other (See Comments)    unknown  . Cefaclor Rash and Other (See Comments)    Reaction: Delirious    . Glipizide Hives  . Lisinopril Hives and Itching    Patient Measurements: Height:  (162.6 cm) Weight: 159 lb 3.2 oz (72.213 kg) IBW/kg (Calculated) : 54.7  Vital Signs: Temp: 97.5 F (36.4 C) (01/28 0448) Temp Source: Oral (01/28 0448) BP: 103/65 mmHg (01/28 0448) Pulse Rate: 71 (01/28 0448) Intake/Output from previous day: 01/27 0701 - 01/28 0700 In: 475 [P.O.:475] Out: -  Intake/Output from this shift:    Labs:  Recent Labs  11/18/15 0444  WBC 8.8  HGB 9.8*  HCT 29.9*  PLT 364     Recent Labs  11/17/15 0404 11/18/15 0444 11/19/15 0452  NA 133* 131* 129*  K 3.7 3.6 3.5  CL 81* 79* 79*  CO2 44* 45* 42*  GLUCOSE 134* 123* 136*  BUN 26* 29* 25*  CREATININE 0.59 0.80 0.60  CALCIUM 8.8* 8.3* 8.6*   Estimated Creatinine Clearance: 50.1 mL/min (by C-G formula based on Cr of 0.6).    Recent Labs  11/18/15 1721 11/18/15 2139 11/19/15 0737  GLUCAP 293* 100* 143*    Medical History: Past Medical History  Diagnosis Date  . Essential hypertension   . Diabetes mellitus type II, controlled (HCC)   . COPD (chronic obstructive pulmonary disease) (HCC)   . Osteoporosis   . Carotid arterial disease (HCC)     a. 2012 Carotid dopplers: 40-59% R ICA, 60-79% L ICA  . Tic [place]   . Arthralgia    . Sjoegren syndrome (HCC)   . TIA (transient ischemic attack)   . Emphysema   . MVP (mitral valve prolapse)   . Chronic diastolic CHF (congestive heart failure) (HCC)     a. 08/2011 Echo: EF 65-70%, mild LVH, mod dil LA, mildly dil RA, mild MR, mild-mod AoV Sclerosis w/o stenosis, mod-sev elev PA pressures, mild TR.  Marland Kitchen Acid reflux   . Diverticulitis   . Anemia   . History of pneumonia   . Syncope and collapse   . Anemia   . Hip fracture, left (HCC)   . Hyperlipidemia   . Hiatal hernia   . Chest pain     a. 08/2011 MV: EF 74%, no ischemia.  . Noncompliance     a. h/o not taking bumex as Rx.  Marland Kitchen PAF (paroxysmal atrial fibrillation) (HCC)     a. CHA2DS2VASc = 8 ->No OAC 2/2 falls.    Medications:  Scheduled:  . arformoterol  15 mcg Nebulization BID  . aspirin EC  81 mg Oral Daily  . budesonide (PULMICORT) nebulizer solution  0.5 mg Nebulization BID  . bumetanide (BUMEX) IV  1 mg Intravenous Once  . bumetanide (BUMEX) IV  1 mg Intravenous Q12H  . calcium carbonate  1,500 mg Oral Q breakfast  . chlorhexidine  10 mL Mouth Rinse BID  .  chlorthalidone  25 mg Oral Daily  . citalopram  20 mg Oral Q lunch  . docusate sodium  100 mg Oral BID  . doxazosin  4 mg Oral QHS  . ezetimibe  10 mg Oral QHS  . feeding supplement (ENSURE ENLIVE)  237 mL Oral BID BM  . fluticasone  1-2 spray Each Nare QHS  . gabapentin  100 mg Oral BID  . heparin  5,000 Units Subcutaneous 3 times per day  . insulin aspart  0-5 Units Subcutaneous QHS  . insulin aspart  0-9 Units Subcutaneous TID WC  . ipratropium-albuterol  3 mL Nebulization Once  . ipratropium-albuterol  3 mL Nebulization Once  . metoCLOPramide  5 mg Oral BID  . multivitamin with minerals  1 tablet Oral QODAY  . multivitamin-lutein  1 capsule Oral BID  . nebivolol  5 mg Oral Daily  . pantoprazole  40 mg Oral BID  . predniSONE  40 mg Oral Q breakfast  . spironolactone  25 mg Oral Q M,W,F  . tiotropium  18 mcg Inhalation Daily    Infusions:    Assessment: Pharmacy consulted to assist in managing electrolytes in this 82 y/o F admitted with COPD exacerbation.   Plan:  Electrolytes are wnl. Will f/u am labs.   Luisa Hart D 11/19/2015,8:01 AM

## 2015-11-19 NOTE — Plan of Care (Signed)
Problem: Phase II Progression Outcomes Goal: Other Phase II Outcomes/Goals Outcome: Progressing Pt continues on o2 [place], o2 sats decrease on exertion, o2 increased to 3L [place], no complaints, no distress or discomfort noted

## 2015-11-19 NOTE — Evaluation (Signed)
Physical Therapy Evaluation Patient Details Name: Catherine Holmes MRN: [id number] DOB: 12-24-1927 Today's Date: 11/19/2015   History of Present Illness  Pt here with COPD exacerbation.  She has been in and out of the CCU X 2 this visit,   Clinical Impression  Pt is eager to participate with PT but is not able to ambulate despite being motivated to do so.  Her O2 was in the high 80s on 2 liters at rest.  With minimal supine activity and getting to EOB her sats drop to the low 80s, increased flow to 4 liters and her O2 increased to ~90 again.  Pt again had a drop in O2 (and c/o significant fatigue) with the effort of standing but is able to bring it back up with deep breathing.     Follow Up Recommendations SNF    Equipment Recommendations       Recommendations for Other Services       Precautions / Restrictions Precautions Precautions: Fall Restrictions Weight Bearing Restrictions: No      Mobility  Bed Mobility Overal bed mobility: Needs Assistance Bed Mobility: Supine to Sit;Sit to Supine     Supine to sit: Min assist Sit to supine: Min assist   General bed mobility comments: Pt needs light assist and hand helad assist to try to pull herself up  Transfers Overall transfer level: Needs assistance Equipment used: Rolling walker (2 wheeled) Transfers: Sit to/from Stand Sit to Stand: Mod assist         General transfer comment: Pt needs assist to get to standing and even more assist to actually get her hips underneath her and shoulders up.  Ambulation/Gait             General Gait Details: Pt is unable to ambulate to attempt taking a step and has drop in O2 (on 4 liters) from ~90% to low 80s.    Stairs            Wheelchair Mobility    Modified Rankin (Stroke Patients Only)       Balance                                             Pertinent Vitals/Pain      Home Living Family/patient expects to be discharged to:: Skilled  nursing facility Living Arrangements: Children                    Prior Function Level of Independence: Needs assistance   Gait / Transfers Assistance Needed: Assist from daughter for all mobility, household activities/chores as needed.  Ambulatory with SPC vs. RW "depending on how I feel".  Has home O2, but denies wearing it on a constant, regular basis           Hand Dominance        Extremity/Trunk Assessment   Upper Extremity Assessment: Generalized weakness (age appropriate limitations)           Lower Extremity Assessment: Generalized weakness (age appropriate limitations)         Communication   Communication: No difficulties  Cognition Arousal/Alertness: Awake/alert (Initially very sleepy) Behavior During Therapy: WFL for tasks assessed/performed Overall Cognitive Status: Within Functional Limits for tasks assessed                      General Comments  Exercises        Assessment/Plan    PT Assessment Patient needs continued PT services  PT Diagnosis Generalized weakness;Difficulty walking   PT Problem List Decreased strength;Decreased range of motion;Decreased activity tolerance;Decreased balance;Decreased mobility;Decreased safety awareness  PT Treatment Interventions DME instruction;Gait training;Functional mobility training;Therapeutic activities;Therapeutic exercise;Balance training;Neuromuscular re-education   PT Goals (Current goals can be found in the Care Plan section) Acute Rehab PT Goals Patient Stated Goal: "Try to walk" PT Goal Formulation: With patient/family Time For Goal Achievement: 12/03/15 Potential to Achieve Goals: Fair    Frequency Min 2X/week   Barriers to discharge        Co-evaluation               End of Session Equipment Utilized During Treatment: Gait belt Activity Tolerance: Patient limited by fatigue Patient left: with call bell/phone within reach;with bed alarm set            Time: 1150-1210 PT Time Calculation (min) (ACUTE ONLY): 20 min   Charges:   PT Evaluation $PT Eval Moderate Complexity: 1 Procedure     PT G Codes:       Loran Senters, PT, DPT [phone]  Malachi Pro 11/19/2015, 5:00 PM

## 2015-11-19 NOTE — Progress Notes (Signed)
[facility] - [place] at [facility]   PATIENT NAME: Catherine Holmes    MR#:  [id number]  DATE OF BIRTH:  1928/08/09  SUBJECTIVE:  CHIEF COMPLAINT:   Chief Complaint  Patient presents with  . Shortness of Breath     Came with respiratory distress, there was requirement of bipap, tapered off, but had to go back to ICU again due to worsening.   Was on HFNC, now on 3 ltr Oxygen. Slowly improving   REVIEW OF SYSTEMS:  CONSTITUTIONAL: No fever, fatigue or weakness.  EYES: No blurred or double vision.  EARS, NOSE, AND THROAT: No tinnitus or ear pain.  RESPIRATORY: positive for cough, shortness of breath, wheezing but no hemoptysis.  CARDIOVASCULAR: No chest pain, orthopnea, edema.  GASTROINTESTINAL: No nausea, vomiting, diarrhea or abdominal pain.  GENITOURINARY: No dysuria, hematuria.  ENDOCRINE: No polyuria, nocturia,  HEMATOLOGY: No anemia, easy bruising or bleeding SKIN: No rash or lesion. MUSCULOSKELETAL: No joint pain or arthritis.   NEUROLOGIC: No tingling, numbness, weakness.  PSYCHIATRY: No anxiety or depression.   ROS  DRUG ALLERGIES:   Allergies  Allergen Reactions  . Atorvastatin   . Clindamycin Diarrhea, Other (See Comments) and Nausea Only  . Codeine   . Doxycycline   . Epinephrine Hives and Itching  . Erythromycin   . Fluticasone-Salmeterol   . Lasix [Furosemide] Itching  . Levofloxacin   . Penicillins   . Propoxyphene Hcl   . Rosuvastatin   . Simvastatin   . Singlet [Chlorphen-Pe-Acetaminophen] Other (See Comments)    Reaction: Wheezing  . Sulfamethoxazole-Trimethoprim   . Teriparatide   . Tetracyclines & Related Other (See Comments)    unknown  . Cefaclor Rash and Other (See Comments)    Reaction: Delirious    . Glipizide Hives  . Lisinopril Hives and Itching    VITALS:  Blood pressure 115/53, pulse 73, temperature 98.5 F (36.9 C), temperature source Oral, resp. rate 20, height  (1.626 m), weight 72.213 kg (159 lb  3.2 oz), SpO2 94 %.  PHYSICAL EXAMINATION:  GENERAL:  82 y.o.-year-old patient lying in the bed with no acute distress. Anxious. EYES: Pupils equal, round, reactive to light and accommodation. No scleral icterus. Extraocular muscles intact.  HEENT: Head atraumatic, normocephalic. Oropharynx and nasopharynx clear.  NECK:  Supple, no jugular venous distention. No thyroid enlargement, no tenderness.  LUNGS:  mild wheezing, no rales,rhonchi or crepitation. On [place] now. CARDIOVASCULAR: S1, S2 normal. No murmurs, rubs, or gallops.  ABDOMEN: Soft, nontender, nondistended. Bowel sounds present. No organomegaly or mass.  EXTREMITIES: No pedal edema, cyanosis, or clubbing.  NEUROLOGIC: Cranial nerves II through XII are intact. Muscle strength 5/5 in all extremities. Sensation intact. Gait not checked.  PSYCHIATRIC: The patient is alert and oriented x 3.  SKIN: No obvious rash, lesion, or ulcer.   Physical Exam LABORATORY PANEL:   CBC  Recent Labs Lab 11/18/15 0444  WBC 8.8  HGB 9.8*  HCT 29.9*  PLT 364   ------------------------------------------------------------------------------------------------------------------  Chemistries   Recent Labs Lab 11/13/15 1809  11/19/15 0452  NA 136  < > 129*  K 3.5  < > 3.5  CL 86*  < > 79*  CO2 37*  < > 42*  GLUCOSE 182*  < > 136*  BUN 15  < > 25*  CREATININE 0.57  < > 0.60  CALCIUM 8.4*  < > 8.6*  AST 20  --   --   ALT 15  --   --  ALKPHOS 99  --   --   BILITOT 0.9  --   --   < > = values in this interval not displayed. ------------------------------------------------------------------------------------------------------------------  Cardiac Enzymes  Recent Labs Lab 11/13/15 1809  TROPONINI 0.05*   ------------------------------------------------------------------------------------------------------------------  RADIOLOGY:  No results found.  ASSESSMENT AND PLAN:   Active Problems:   COPD exacerbation (HCC)   Acute on  chronic respiratory failure (HCC)   Pressure ulcer  * Acute on ch respiratory failure due to COPD exacerbation   IV steroids, nebs, Abx. Meropenem x 5 days   Required Bipap and now on [place]   Repeated x ray chest- no new findings.   Pulmonary rehab after discharge  * Chromic diastolic CHF   No exacerbation this time.   On diuretics.  * Hyperlipidemia   Cont Zetia.  * Anxiety    Xanax.    Cont Citalopram.   All the records are reviewed and case discussed with Care Management/Social Workerr. Management plans discussed with the patient, family and they are in agreement.  CODE STATUS: Full.  TOTAL TIME TAKING CARE OF THIS PATIENT: 35 minutes.   POSSIBLE D/C IN 1-2 DAYS, DEPENDING ON CLINICAL CONDITION.  Patient wants to [facility] with [facility] although SNF might be better   Milagros Loll R M.D on 11/19/2015   Between 7am to 6pm - Pager - [phone]  After 6pm go to [email or web address] - password [email or web address] [facility]  [place] [facility]  Office  [phone]  CC: Primary care physician; Marisue Ivan, MD  Note: This dictation was prepared with Dragon dictation along with smaller phrase technology. Any transcriptional errors that result from this process are unintentional.

## 2015-11-20 ENCOUNTER — Inpatient Hospital Stay: Payer: Medicare Other

## 2015-11-20 LAB — GLUCOSE, CAPILLARY
GLUCOSE-CAPILLARY: 126 mg/dL — AB (ref 65–99)
Glucose-Capillary: 180 mg/dL — ABNORMAL HIGH (ref 65–99)
Glucose-Capillary: 197 mg/dL — ABNORMAL HIGH (ref 65–99)
Glucose-Capillary: 344 mg/dL — ABNORMAL HIGH (ref 65–99)

## 2015-11-20 LAB — BASIC METABOLIC PANEL
ANION GAP: 10 (ref 5–15)
BUN: 29 mg/dL — ABNORMAL HIGH (ref 6–20)
CALCIUM: 8.8 mg/dL — AB (ref 8.9–10.3)
CO2: 43 mmol/L — ABNORMAL HIGH (ref 22–32)
CREATININE: 0.67 mg/dL (ref 0.44–1.00)
Chloride: 72 mmol/L — ABNORMAL LOW (ref 101–111)
GFR calc non Af Amer: >60 mL/min (ref >60)
Glucose, Bld: 141 mg/dL — ABNORMAL HIGH (ref 65–99)
Potassium: 3.6 mmol/L (ref 3.5–5.1)
SODIUM: 125 mmol/L — AB (ref 135–145)

## 2015-11-20 LAB — PHOSPHORUS: PHOSPHORUS: 3.1 mg/dL (ref 2.5–4.6)

## 2015-11-20 LAB — MAGNESIUM: MAGNESIUM: 1.2 mg/dL — AB (ref 1.7–2.4)

## 2015-11-20 MED ORDER — MAGNESIUM SULFATE 4 GM/100ML IV SOLN: 4.0000 g | Freq: Once | INTRAVENOUS | Status: DC

## 2015-11-20 MED ORDER — LORAZEPAM 2 MG/ML IJ SOLN
INTRAMUSCULAR | Status: AC
  Filled 2015-11-20: qty 1

## 2015-11-20 MED ORDER — LORAZEPAM BOLUS VIA INFUSION
1.0000 mg | Freq: Once | INTRAVENOUS | Status: AC
  Administered 2015-11-20: 1 mg via INTRAVENOUS
  Filled 2015-11-20: qty 1

## 2015-11-20 MED ORDER — MAGNESIUM SULFATE 4 GM/100ML IV SOLN
4.0000 g | Freq: Once | INTRAVENOUS | Status: AC
  Administered 2015-11-20: 4 g via INTRAVENOUS
  Filled 2015-11-20: qty 100

## 2015-11-20 MED ORDER — POTASSIUM CHLORIDE CRYS ER 20 MEQ PO TBCR
20.0000 meq | EXTENDED_RELEASE_TABLET | Freq: Every day | ORAL | Status: DC
  Administered 2015-11-21: 10:00:00 20 meq via ORAL
  Filled 2015-11-20 (×3): qty 1

## 2015-11-20 MED ORDER — MAGNESIUM OXIDE 400 (241.3 MG) MG PO TABS
400.0000 mg | ORAL_TABLET | Freq: Every day | ORAL | Status: DC
  Administered 2015-11-21: 400 mg via ORAL
  Filled 2015-11-20: qty 1

## 2015-11-20 MED ORDER — BUMETANIDE 1 MG PO TABS
2.0000 mg | ORAL_TABLET | Freq: Three times a day (TID) | ORAL | Status: DC
  Administered 2015-11-20: 17:00:00 2 mg via ORAL
  Filled 2015-11-20 (×4): qty 1

## 2015-11-20 NOTE — Progress Notes (Signed)
Pharmacy Consult for Electrolyte Monitoring and Replacement  Allergies  Allergen Reactions  . Atorvastatin   . Clindamycin Diarrhea, Other (See Comments) and Nausea Only  . Codeine   . Doxycycline   . Epinephrine Hives and Itching  . Erythromycin   . Fluticasone-Salmeterol   . Lasix [Furosemide] Itching  . Levofloxacin   . Penicillins   . Propoxyphene Hcl   . Rosuvastatin   . Simvastatin   . Singlet [Chlorphen-Pe-Acetaminophen] Other (See Comments)    Reaction: Wheezing  . Sulfamethoxazole-Trimethoprim   . Teriparatide   . Tetracyclines & Related Other (See Comments)    unknown  . Cefaclor Rash and Other (See Comments)    Reaction: Delirious    . Glipizide Hives  . Lisinopril Hives and Itching    Patient Measurements: Height:  (162.6 cm) Weight: 159 lb (72.122 kg) IBW/kg (Calculated) : 54.7  Vital Signs: Temp: 98 F (36.7 C) (01/29 0553) Temp Source: Oral (01/28 2113) BP: 130/51 mmHg (01/29 0553) Pulse Rate: 74 (01/29 0553) Intake/Output from previous day: 01/28 0701 - 01/29 0700 In: 480 [P.O.:480] Out: -  Intake/Output from this shift:    Labs:  Recent Labs  11/18/15 0444  WBC 8.8  HGB 9.8*  HCT 29.9*  PLT 364     Recent Labs  11/18/15 0444 11/19/15 0452 11/20/15 0546  NA 131* 129* 125*  K 3.6 3.5 3.6  CL 79* 79* 72*  CO2 45* 42* 43*  GLUCOSE 123* 136* 141*  BUN 29* 25* 29*  CREATININE 0.80 0.60 0.67  CALCIUM 8.3* 8.6* 8.8*  MG  --   --  1.2*  PHOS  --   --  3.1   Estimated Creatinine Clearance: 50.1 mL/min (by C-G formula based on Cr of 0.67).    Recent Labs  11/19/15 1113 11/19/15 1610 11/19/15 2146  GLUCAP 163* 301* 238*    Medical History: Past Medical History  Diagnosis Date  . Essential hypertension   . Diabetes mellitus type II, controlled (HCC)   . COPD (chronic obstructive pulmonary disease) (HCC)   . Osteoporosis   . Carotid arterial disease (HCC)     a. 2012 Carotid dopplers: 40-59% R ICA, 60-79% L ICA  .  [place]   . Arthralgia   . Sjoegren syndrome (HCC)   . TIA (transient ischemic attack)   . Emphysema   . MVP (mitral valve prolapse)   . Chronic diastolic CHF (congestive heart failure) (HCC)     a. 08/2011 Echo: EF 65-70%, mild LVH, mod dil LA, mildly dil RA, mild MR, mild-mod AoV Sclerosis w/o stenosis, mod-sev elev PA pressures, mild TR.  Marland Kitchen Acid reflux   . Diverticulitis   . Anemia   . History of pneumonia   . Syncope and collapse   . Anemia   . Hip fracture, left (HCC)   . Hyperlipidemia   . Hiatal hernia   . Chest pain     a. 08/2011 MV: EF 74%, no ischemia.  . Noncompliance     a. h/o not taking bumex as Rx.  Marland Kitchen PAF (paroxysmal atrial fibrillation) (HCC)     a. CHA2DS2VASc = 8 ->No OAC 2/2 falls.    Medications:  Scheduled:  . arformoterol  15 mcg Nebulization BID  . aspirin EC  81 mg Oral Daily  . budesonide (PULMICORT) nebulizer solution  0.5 mg Nebulization BID  . bumetanide (BUMEX) IV  1 mg Intravenous Once  . bumetanide  2 mg Oral BID  . calcium carbonate  1,500 mg Oral Q breakfast  . chlorhexidine  10 mL Mouth Rinse BID  . chlorthalidone  25 mg Oral Daily  . citalopram  20 mg Oral Q lunch  . docusate sodium  100 mg Oral BID  . doxazosin  4 mg Oral QHS  . ezetimibe  10 mg Oral QHS  . feeding supplement (ENSURE ENLIVE)  237 mL Oral BID BM  . fluticasone  1-2 spray Each Nare QHS  . gabapentin  100 mg Oral BID  . heparin  5,000 Units Subcutaneous 3 times per day  . insulin aspart  0-5 Units Subcutaneous QHS  . insulin aspart  0-9 Units Subcutaneous TID WC  . ipratropium-albuterol  3 mL Nebulization Once  . ipratropium-albuterol  3 mL Nebulization Once  . LORazepam      . magnesium sulfate 1 - 4 g bolus IVPB  4 g Intravenous Once  . metoCLOPramide  5 mg Oral BID  . multivitamin with minerals  1 tablet Oral QODAY  . multivitamin-lutein  1 capsule Oral BID  . nebivolol  5 mg Oral Daily  . pantoprazole  40 mg Oral BID  . predniSONE  40 mg Oral Q  breakfast  . spironolactone  25 mg Oral Q M,W,F  . tiotropium  18 mcg Inhalation Daily   Infusions:    Assessment: Pharmacy consulted to assist in managing electrolytes in this 82 y/o F admitted with COPD exacerbation.   Plan:  Magnesium is low at 1.2 so will replace with 4 grams of magnesium sulfate iv once and f/u am labs.   Catherine Holmes D 11/20/2015,7:23 AM

## 2015-11-20 NOTE — Progress Notes (Signed)
[facility] - [place] at [facility]   PATIENT NAME: Catherine Holmes    MR#:  [id number]  DATE OF BIRTH:  [birth year]  SUBJECTIVE:  CHIEF COMPLAINT:   Chief Complaint  Patient presents with  . Shortness of Breath     Came with respiratory distress, there was requirement of bipap, tapered off, but had to go back to ICU again due to worsening.   Was on HFNC, now on 3 ltr Oxygen. Slowly improving. Daughter at bedside.   REVIEW OF SYSTEMS:  CONSTITUTIONAL: No fever, fatigue or weakness.  EYES: No blurred or double vision.  EARS, NOSE, AND THROAT: No tinnitus or ear pain.  RESPIRATORY: positive for cough, shortness of breath, wheezing but no hemoptysis.  CARDIOVASCULAR: No chest pain, orthopnea, edema.  GASTROINTESTINAL: No nausea, vomiting, diarrhea or abdominal pain.  GENITOURINARY: No dysuria, hematuria.  ENDOCRINE: No polyuria, nocturia,  HEMATOLOGY: No anemia, easy bruising or bleeding SKIN: No rash or lesion. MUSCULOSKELETAL: No joint pain or arthritis.   NEUROLOGIC: No tingling, numbness, weakness.  PSYCHIATRY: No anxiety or depression.   ROS  DRUG ALLERGIES:   Allergies  Allergen Reactions  . Atorvastatin   . Clindamycin Diarrhea, Other (See Comments) and Nausea Only  . Codeine   . Doxycycline   . Epinephrine Hives and Itching  . Erythromycin   . Fluticasone-Salmeterol   . Lasix [Furosemide] Itching  . Levofloxacin   . Penicillins   . Propoxyphene Hcl   . Rosuvastatin   . Simvastatin   . Singlet [Chlorphen-Pe-Acetaminophen] Other (See Comments)    Reaction: Wheezing  . Sulfamethoxazole-Trimethoprim   . Teriparatide   . Tetracyclines & Related Other (See Comments)    unknown  . Cefaclor Rash and Other (See Comments)    Reaction: Delirious    . Glipizide Hives  . Lisinopril Hives and Itching    VITALS:  Blood pressure 130/51, pulse 74, temperature 98 F (36.7 C), temperature source Oral, resp. rate 20, height  (1.626 m),  weight 72.122 kg (159 lb), SpO2 93 %.  PHYSICAL EXAMINATION:  GENERAL:  82 y.o.-year-old patient lying in the bed with no acute distress. Anxious. EYES: Pupils equal, round, reactive to light and accommodation. No scleral icterus. Extraocular muscles intact.  HEENT: Head atraumatic, normocephalic. Oropharynx and nasopharynx clear.  NECK:  Supple, no jugular venous distention. No thyroid enlargement, no tenderness.  LUNGS:  mild wheezing, no rales,rhonchi or crepitation. On [place] now. CARDIOVASCULAR: S1, S2 normal. No murmurs, rubs, or gallops.  ABDOMEN: Soft, nontender, nondistended. Bowel sounds present. No organomegaly or mass.  EXTREMITIES: No pedal edema, cyanosis, or clubbing.  NEUROLOGIC: Cranial nerves II through XII are intact. Muscle strength 5/5 in all extremities. Sensation intact. Gait not checked.  PSYCHIATRIC: The patient is alert and oriented x 3.  SKIN: No obvious rash, lesion, or ulcer.   Physical Exam LABORATORY PANEL:   CBC  Recent Labs Lab 11/18/15 0444  WBC 8.8  HGB 9.8*  HCT 29.9*  PLT 364   ------------------------------------------------------------------------------------------------------------------  Chemistries   Recent Labs Lab 11/13/15 1809  11/20/15 0546  NA 136  < > 125*  K 3.5  < > 3.6  CL 86*  < > 72*  CO2 37*  < > 43*  GLUCOSE 182*  < > 141*  BUN 15  < > 29*  CREATININE 0.57  < > 0.67  CALCIUM 8.4*  < > 8.8*  MG  --   --  1.2*  AST 20  --   --  ALT 15  --   --   ALKPHOS 99  --   --   BILITOT 0.9  --   --   < > = values in this interval not displayed. ------------------------------------------------------------------------------------------------------------------  Cardiac Enzymes  Recent Labs Lab 11/13/15 1809  TROPONINI 0.05*   ------------------------------------------------------------------------------------------------------------------  RADIOLOGY:  Dg Chest 2 View  11/20/2015  CLINICAL DATA:  CHF, hypoxia EXAM:  CHEST  2 VIEW COMPARISON:  11/15/2015 FINDINGS: Cardiomegaly. No focal airspace opacities. Mild interstitial prominence and hyperinflation again noted, unchanged. No effusions. No acute bony abnormality. IMPRESSION: Cardiomegaly. Mild COPD/interstitial prominence.  No change. Electronically Signed   By: Charlett Nose M.D.   On: 11/20/2015 11:10    ASSESSMENT AND PLAN:   Active Problems:   COPD exacerbation (HCC)   Acute on chronic respiratory failure (HCC)   Pressure ulcer  * Acute on ch respiratory failure due to COPD exacerbation   IV steroids, nebs, Abx. Meropenem x 5 days   Required Bipap and now on [place]   Repeated x ray chest today - no new findings.   Pulmonary rehab after discharge  * Chronic diastolic CHF   No exacerbation this time.   On diuretics.  * Hyperlipidemia   Cont Zetia.  * Anxiety    Xanax.    Cont Citalopram.   All the records are reviewed and case discussed with Care Management/Social Workerr. Management plans discussed with the patient, family and they are in agreement.  CODE STATUS: Full.  TOTAL TIME TAKING CARE OF THIS PATIENT: 35 minutes.  >50% time spent in co-ordination of care. Discussed regarding need for SNF. Code status. Prognosis with COPD and worsening. With patient and daughter at bedside.  Patient has agreed for SNF.  D/C in AM   Milagros Loll R M.D on 11/20/2015   Between 7am to 6pm - Pager - [phone]  After 6pm go to [email or web address] - password [email or web address] [facility]  [facility]  Office  [phone]  CC: Primary care physician; Marisue Ivan, MD  Note: This dictation was prepared with Dragon dictation along with smaller phrase technology. Any transcriptional errors that result from this process are unintentional.

## 2015-11-20 NOTE — Progress Notes (Signed)
PULMONARY / CRITICAL CARE MEDICINE   Name: Catherine Holmes MRN: [id number] DOB: 1928/02/24    ADMISSION DATE:  11/13/2015 Consulting physician - Dr. Elisabeth Pigeon  BRIEF HISTORY: 82 yo with PMHx of COPD, anxiety, HTN, DM, admitted for AECOPD required bipap.   SUBJECTIVE:  Patient seen along with her daughter at bedside, she appears to be doing better today, and her daughter notes that she appears to be less dyspneic today. STUDIES:   VITAL SIGNS: Temp:  [97.5 F (36.4 C)-98.5 F (36.9 C)] 98 F (36.7 C) (01/29 0553) Pulse Rate:  [73-79] 74 (01/29 0553) Resp:  [17-20] 20 (01/29 0553) BP: (115-139)/(49-53) 130/51 mmHg (01/29 0553) SpO2:  [91 %-98 %] 93 % (01/29 0744) Weight:  [159 lb (72.122 kg)] 159 lb (72.122 kg) (01/29 0500) HEMODYNAMICS:   VENTILATOR SETTINGS:   INTAKE / OUTPUT:  Intake/Output Summary (Last 24 hours) at 11/20/15 1025 Last data filed at 11/19/15 1900  Gross per 24 hour  Intake    480 ml  Output      0 ml  Net    480 ml    Review of Systems  Constitutional: Negative for fever and chills.  HENT: Negative for congestion and hearing loss.   Eyes: Negative for blurred vision and double vision.  Respiratory: Negative for cough, hemoptysis and wheezing.        Sob improved.   Cardiovascular: Negative for chest pain and palpitations.  Gastrointestinal: Negative for heartburn, nausea and vomiting.  Genitourinary: Negative for dysuria.  Musculoskeletal: Negative for myalgias.  Skin: Negative for itching and rash.  Neurological: Negative for dizziness and headaches.  Endo/Heme/Allergies: Does not bruise/bleed easily.    Physical Exam  Constitutional: She is oriented to person, place, and time and well-developed, well-nourished, and in no distress.  HENT:  Head: Normocephalic and atraumatic.  Right Ear: External ear normal.  Left Ear: External ear normal.  Eyes: EOM are normal. Pupils are equal, round, and reactive to light. Left eye exhibits no discharge.   Neck: Normal range of motion. Neck supple. No tracheal deviation present. No thyromegaly present.  Cardiovascular: Normal rate, regular rhythm, normal heart sounds and intact distal pulses.   No murmur heard. Pulmonary/Chest: No respiratory distress. She has no wheezes. She has no rales. She exhibits no tenderness.  Intermittent bipap   Abdominal: Soft. Bowel sounds are normal. She exhibits no distension. There is no tenderness.  Musculoskeletal: Normal range of motion. She exhibits edema.  Trace R leg edema (chronic).   Neurological: She is alert and oriented to person, place, and time.  Skin: Skin is warm and dry.  Psychiatric: Affect normal.  Nursing note and vitals reviewed.    LABS:  CBC  Recent Labs Lab 11/15/15 0907 11/16/15 0431 11/18/15 0444  WBC 12.5* 8.3 8.8  HGB 8.9* 9.1* 9.8*  HCT 28.2* 28.1* 29.9*  PLT 345 343 364   Coag's No results for input(s): APTT, INR in the last 168 hours. BMET  Recent Labs Lab 11/18/15 0444 11/19/15 0452 11/20/15 0546  NA 131* 129* 125*  K 3.6 3.5 3.6  CL 79* 79* 72*  CO2 45* 42* 43*  BUN 29* 25* 29*  CREATININE 0.80 0.60 0.67  GLUCOSE 123* 136* 141*   Electrolytes  Recent Labs Lab 11/18/15 0444 11/19/15 0452 11/20/15 0546  CALCIUM 8.3* 8.6* 8.8*  MG  --   --  1.2*  PHOS  --   --  3.1   Sepsis Markers No results for input(s): LATICACIDVEN, PROCALCITON, O2SATVEN in  the last 168 hours. ABG  Recent Labs Lab 11/15/15 1120  PHART 7.48*  PCO2ART 61*  PO2ART 77*   Liver Enzymes  Recent Labs Lab 11/13/15 1809  AST 20  ALT 15  ALKPHOS 99  BILITOT 0.9  ALBUMIN 3.7   Cardiac Enzymes  Recent Labs Lab 11/13/15 1809  TROPONINI 0.05*   Glucose  Recent Labs Lab 11/18/15 2139 11/19/15 0737 11/19/15 1113 11/19/15 1610 11/19/15 2146 11/20/15 0759  GLUCAP 100* 143* 163* 301* 238* 126*    Imaging No results found. Cultures: BCx2  UC  Sputum 1/23>>  Antibiotics: Meropenem  1/22>>  Lines:  ASSESSMENT / PLAN:  82 year old female past medical history of COPD, diabetes, hypertension, admitted for acute COPD exacerbation, transerred to med unit 1/23, transferred back to ICU on 1/24 due to respiratory distress and unable to wean off bipap. Now doing well, however, anxiety seems to be a component driving her respiratory status.   AECOPD - Continue prednisone taper. -Continue with meropenem (5 days total) -Continue with the scheduled nebulizers -Sputum culture pending, MRSA screen negative.  -Maintain O2 saturations greater than 88% -Chest physiotherapy, Acapella -BiPAP daily at bedtime, minimal 4-6 hours per night, when necessary BiPAP during the day with naps.  INPATIENT order only, does not need this at home, does not need to be discharged on this order.  -When necessary nebulizers -Patient states she wears 2 L of O2 as needed at home, will continue -Primary pulmonologist Dr. Welton Flakes -Chest x-ray with no acute infiltrates, however does have productive greenish sputum. - doing well today, transition to [place] this AM, will plan to transfer to medical floor  --Dr. Park Breed will be resuming care of the patient from tomorrow (1/30).    Severe Reflux and Large Hiatal Hernia - cont with BID PPI - the combination of these two after her barium swallow may have caused some silent reflux/aspiration and triggered her COPD.   Anxiety - Continue on Xanax, which appears to have helped.    Wells Guiles M.D.  Note: This note was prepared with Dragon dictation along with smaller phrase technology. Any transcriptional errors that result from this process are unintentional.

## 2015-11-20 NOTE — Progress Notes (Signed)
Pt anxious and agitated.  Request meds for her "nerves". Req. Conveyed to desk.

## 2015-11-20 NOTE — Progress Notes (Signed)
Alert and oriented, drowsy throughout shift, sleeping during breakfast and lunch, incontinent of urine, daughter updated at bedside, continues on 2 L oxygen, denies pain, xanax given for anxiety. IV magnesium given for replacement, pt refuses to take large pills, did not take po potassium.

## 2015-11-21 LAB — GLUCOSE, CAPILLARY
GLUCOSE-CAPILLARY: 247 mg/dL — AB (ref 65–99)
Glucose-Capillary: 135 mg/dL — ABNORMAL HIGH (ref 65–99)
Glucose-Capillary: 174 mg/dL — ABNORMAL HIGH (ref 65–99)

## 2015-11-21 LAB — CBC WITH DIFFERENTIAL/PLATELET
BASOS PCT: 0 %
Basophils Absolute: 0 10*3/uL (ref 0–0.1)
EOS PCT: 1 %
Eosinophils Absolute: 0.1 10*3/uL (ref 0–0.7)
HEMATOCRIT: 32 % — AB (ref 35.0–47.0)
Hemoglobin: 10.5 g/dL — ABNORMAL LOW (ref 12.0–16.0)
LYMPHS ABS: 1.7 10*3/uL (ref 1.0–3.6)
Lymphocytes Relative: 13 %
MCH: 26.4 pg (ref 26.0–34.0)
MCHC: 32.7 g/dL (ref 32.0–36.0)
MCV: 80.6 fL (ref 80.0–100.0)
MONO ABS: 1.4 10*3/uL — AB (ref 0.2–0.9)
MONOS PCT: 11 %
NEUTROS PCT: 75 %
Neutro Abs: 9.8 10*3/uL — ABNORMAL HIGH (ref 1.4–6.5)
PLATELETS: 381 10*3/uL (ref 150–440)
RBC: 3.97 MIL/uL (ref 3.80–5.20)
RDW: 17.4 % — ABNORMAL HIGH (ref 11.5–14.5)
WBC: 13 10*3/uL — ABNORMAL HIGH (ref 3.6–11.0)

## 2015-11-21 LAB — BASIC METABOLIC PANEL
Anion gap: 10 (ref 5–15)
BUN: 28 mg/dL — AB (ref 6–20)
CALCIUM: 8.9 mg/dL (ref 8.9–10.3)
CO2: 43 mmol/L — AB (ref 22–32)
Chloride: 70 mmol/L — ABNORMAL LOW (ref 101–111)
Creatinine, Ser: 0.63 mg/dL (ref 0.44–1.00)
GFR calc Af Amer: >60 mL/min (ref >60)
GLUCOSE: 151 mg/dL — AB (ref 65–99)
Potassium: 3.4 mmol/L — ABNORMAL LOW (ref 3.5–5.1)
Sodium: 123 mmol/L — ABNORMAL LOW (ref 135–145)

## 2015-11-21 LAB — PHOSPHORUS: Phosphorus: 3 mg/dL (ref 2.5–4.6)

## 2015-11-21 LAB — MAGNESIUM: Magnesium: 1.8 mg/dL (ref 1.7–2.4)

## 2015-11-21 MED ORDER — BUMETANIDE 2 MG PO TABS: 2.0000 mg | ORAL_TABLET | Freq: Every day | ORAL | Status: DC

## 2015-11-21 MED ORDER — POTASSIUM CHLORIDE CRYS ER 20 MEQ PO TBCR: 20.0000 meq | EXTENDED_RELEASE_TABLET | Freq: Once | ORAL | Status: DC

## 2015-11-21 MED ORDER — ENSURE ENLIVE PO LIQD: 237.0000 mL | Freq: Two times a day (BID) | ORAL | Status: DC

## 2015-11-21 MED ORDER — MAGNESIUM OXIDE 400 (241.3 MG) MG PO TABS: 400.0000 mg | ORAL_TABLET | Freq: Every day | ORAL | Status: DC

## 2015-11-21 MED ORDER — BUMETANIDE 1 MG PO TABS: 2.0000 mg | ORAL_TABLET | Freq: Every day | ORAL | Status: DC

## 2015-11-21 MED ORDER — GUAIFENESIN-DM 100-10 MG/5ML PO SYRP: 5.0000 mL | ORAL_SOLUTION | ORAL | Status: AC | PRN

## 2015-11-21 MED ORDER — LORAZEPAM 2 MG/ML IJ SOLN
0.5000 mg | Freq: Once | INTRAMUSCULAR | Status: AC
Start: 2015-11-21 — End: 2015-11-21
  Administered 2015-11-21: 02:00:00 0.5 mg via INTRAVENOUS
  Filled 2015-11-21: qty 1

## 2015-11-21 MED ORDER — IPRATROPIUM-ALBUTEROL 0.5-2.5 (3) MG/3ML IN SOLN: 3.0000 mL | RESPIRATORY_TRACT | Status: DC | PRN

## 2015-11-21 MED ORDER — POTASSIUM CHLORIDE 20 MEQ PO PACK: 20.0000 meq | PACK | Freq: Once | ORAL | Status: DC

## 2015-11-21 MED ORDER — HYDROCODONE-ACETAMINOPHEN 5-325 MG PO TABS: 1.0000 | ORAL_TABLET | ORAL | Status: AC | PRN

## 2015-11-21 MED ORDER — DOCUSATE SODIUM 100 MG PO CAPS: 100.0000 mg | ORAL_CAPSULE | Freq: Two times a day (BID) | ORAL | Status: DC

## 2015-11-21 MED ORDER — BUDESONIDE 0.5 MG/2ML IN SUSP: 0.5000 mg | Freq: Two times a day (BID) | RESPIRATORY_TRACT | Status: DC

## 2015-11-21 MED ORDER — ALPRAZOLAM 0.25 MG PO TABS: 0.2500 mg | ORAL_TABLET | Freq: Three times a day (TID) | ORAL | Status: AC | PRN

## 2015-11-21 MED ORDER — POTASSIUM CHLORIDE CRYS ER 10 MEQ PO TBCR: 10.0000 meq | EXTENDED_RELEASE_TABLET | Freq: Every day | ORAL | Status: DC

## 2015-11-21 NOTE — Progress Notes (Signed)
Notified Dr. Elpidio Anis that once IV was removed and pressure applied, RN left room and was called back to the room approx. 5 minutes later for complaints of bleeding. Blood under the tissue in the arm that IV was removed from extending from Left elbow to L wrist with mild swelling. Pt denies pain in arm but is upset over ecchymotic appearance. MD acknowledged to "watch" arm  And no intervention needed at this time.

## 2015-11-21 NOTE — Discharge Instructions (Signed)
°  DIET:  °Cardiac diet ° °DISCHARGE CONDITION:  °Stable ° °ACTIVITY:  °Activity as tolerated ° °OXYGEN:  °Home Oxygen: Yes.   °  °Oxygen Delivery: 3 liters/min via Patient connected to nasal cannula oxygen ° °DISCHARGE LOCATION:  °nursing home  ° °If you experience worsening of your admission symptoms, develop shortness of breath, life threatening emergency, suicidal or homicidal thoughts you must seek medical attention immediately by calling 911 or calling your MD immediately  if symptoms less severe. ° °You Must read complete instructions/literature along with all the possible adverse reactions/side effects for all the Medicines you take and that have been prescribed to you. Take any new Medicines after you have completely understood and accpet all the possible adverse reactions/side effects.  ° °Please note ° °You were cared for by a hospitalist during your hospital stay. If you have any questions about your discharge medications or the care you received while you were in the hospital after you are discharged, you can call the unit and asked to speak with the hospitalist on call if the hospitalist that took care of you is not available. Once you are discharged, your primary care physician will handle any further medical issues. Please note that NO REFILLS for any discharge medications will be authorized once you are discharged, as it is imperative that you return to your primary care physician (or establish a relationship with a primary care physician if you do not have one) for your aftercare needs so that they can reassess your need for medications and monitor your lab values. ° ° ° °

## 2015-11-21 NOTE — Progress Notes (Signed)
Pt not placed on Bipap. She does not tolerate it well & becomes agitated easily.

## 2015-11-21 NOTE — Progress Notes (Signed)
Pt is alert and oriented, c/o anxiety improved with xanax, bed bound, IV removed with bleeding into soft tissue, MD notified. Pt is d/c to [facility], report given to [place]. Daughter, Stanton Kidney, is aware of transfer and at bedside. Pt is d/c via EMS.

## 2015-11-21 NOTE — Discharge Summary (Signed)
[facility] - [place] at [facility]   PATIENT NAME: Catherine Holmes    MR#:  [id number]  DATE OF BIRTH:  [birth year]  DATE OF ADMISSION:  11/13/2015 ADMITTING PHYSICIAN: Katha Hamming, MD  DATE OF DISCHARGE: No discharge date for patient encounter.  PRIMARY CARE PHYSICIAN: Marisue Ivan, MD    ADMISSION DIAGNOSIS:  Hypoxia [R09.02] Elevated troponin [R79.89] COPD exacerbation (HCC) [J44.1]  DISCHARGE DIAGNOSIS:  Active Problems:   COPD exacerbation (HCC)   Acute on chronic respiratory failure (HCC)   Pressure ulcer   SECONDARY DIAGNOSIS:   Past Medical History  Diagnosis Date  . Essential hypertension   . Diabetes mellitus type II, controlled (HCC)   . COPD (chronic obstructive pulmonary disease) (HCC)   . Osteoporosis   . Carotid arterial disease (HCC)     a. 2012 Carotid dopplers: 40-59% R ICA, 60-79% L ICA  . Tic douloureux   . Arthralgia   . Sjoegren syndrome (HCC)   . TIA (transient ischemic attack)   . Emphysema   . MVP (mitral valve prolapse)   . Chronic diastolic CHF (congestive heart failure) (HCC)     a. 08/2011 Echo: EF 65-70%, mild LVH, mod dil LA, mildly dil RA, mild MR, mild-mod AoV Sclerosis w/o stenosis, mod-sev elev PA pressures, mild TR.  Marland Kitchen Acid reflux   . Diverticulitis   . Anemia   . History of pneumonia   . Syncope and collapse   . Anemia   . Hip fracture, left (HCC)   . Hyperlipidemia   . Hiatal hernia   . Chest pain     a. 08/2011 MV: EF 74%, no ischemia.  . Noncompliance     a. h/o not taking bumex as Rx.  Marland Kitchen PAF (paroxysmal atrial fibrillation) (HCC)     a. CHA2DS2VASc = 8 ->No OAC 2/2 falls.     ADMITTING HISTORY  Catherine Holmes is a 82 y.o. female with a known history of htn, diabetes mellitus, came in because of shortness of breath. She has been having shortness of breath, followed up with Dr. Dr. Dossie Arbour.: Comes in because of shortness of breath worse today morning associated with orthopnea,  PND. No chest pain. Patient has some cough. According to patient and patient's daughter very compliant with medications. Chest x-ray is negative for pneumonia in the emergency room, normal lab work. However because of her shortness of breath she was given a dose of IV Bumex and also started on BiPAP. Now she is at the end of the BiPAP on 2 L of oxygen she is saturating 94%. Able to Talk full sentences. Patient is very anxious. And the patient's daughter her R leg edema is getting better. But the patient is mostly confined to her recliner   HOSPITAL COURSE:   * Acute on ch respiratory failure due to COPD exacerbation  IV steroids, nebs, Abx. Meropenem x 5 days  Required Bipap and now on [employer]  Repeated x ray chest - no new findings.  Pulmonary rehab after discharge F/U Dr. Welton Flakes with pulmonary 1 week  * Chronic diastolic CHF  No exacerbation this time.  On diuretics. Bumex dose decreased due to hyponatremia and alkalosis. Needs BMP in 1week. Has chronic hyponatremia 125-129  * Hyperlipidemia  Cont Zetia.  * Anxiety  Xanax PRN  Patient will be discharged to SNF   CONSULTS OBTAINED:     DRUG ALLERGIES:   Allergies  Allergen Reactions  . Atorvastatin   . Clindamycin Diarrhea, Other (See Comments)  and Nausea Only  . Codeine   . Doxycycline   . Epinephrine Hives and Itching  . Erythromycin   . Fluticasone-Salmeterol   . Lasix [Furosemide] Itching  . Levofloxacin   . Penicillins   . Propoxyphene Hcl   . Rosuvastatin   . Simvastatin   . Singlet [Chlorphen-Pe-Acetaminophen] Other (See Comments)    Reaction: Wheezing  . Sulfamethoxazole-Trimethoprim   . Teriparatide   . Tetracyclines & Related Other (See Comments)    unknown  . Cefaclor Rash and Other (See Comments)    Reaction: Delirious    . Glipizide Hives  . Lisinopril Hives and Itching    DISCHARGE MEDICATIONS:   Current Discharge Medication List    START taking these medications   Details   ALPRAZolam (XANAX) 0.25 MG tablet Take 1 tablet (0.25 mg total) by mouth 3 (three) times daily as needed for anxiety. Qty: 30 tablet, Refills: 0    budesonide (PULMICORT) 0.5 MG/2ML nebulizer solution Take 2 mLs (0.5 mg total) by nebulization 2 (two) times daily. Refills: 12    docusate sodium (COLACE) 100 MG capsule Take 1 capsule (100 mg total) by mouth 2 (two) times daily. Qty: 10 capsule, Refills: 0    feeding supplement, ENSURE ENLIVE, (ENSURE ENLIVE) LIQD Take 237 mLs by mouth 2 (two) times daily between meals. Qty: 237 mL, Refills: 12    guaiFENesin-dextromethorphan (ROBITUSSIN DM) 100-10 MG/5ML syrup Take 5 mLs by mouth every 4 (four) hours as needed for cough. Qty: 118 mL, Refills: 0    ipratropium-albuterol (DUONEB) 0.5-2.5 (3) MG/3ML SOLN Take 3 mLs by nebulization every 4 (four) hours as needed. Qty: 360 mL    magnesium oxide (MAG-OX) 400 (241.3 Mg) MG tablet Take 1 tablet (400 mg total) by mouth daily.      CONTINUE these medications which have CHANGED   Details  bumetanide (BUMEX) 2 MG tablet Take 1 tablet (2 mg total) by mouth daily. Qty: 60 tablet, Refills: 11    HYDROcodone-acetaminophen (NORCO/VICODIN) 5-325 MG tablet Take 1 tablet by mouth every 4 (four) hours as needed for moderate pain. Qty: 30 tablet, Refills: 0    potassium chloride (K-DUR,KLOR-CON) 10 MEQ tablet Take 1 tablet (10 mEq total) by mouth daily.      CONTINUE these medications which have NOT CHANGED   Details  albuterol (PROVENTIL) (2.5 MG/3ML) 0.083% nebulizer solution Take 2.5 mg by nebulization every 6 (six) hours as needed for wheezing or shortness of breath.     aspirin EC 81 MG tablet Take 81 mg by mouth daily.    beta carotene w/minerals (OCUVITE) tablet Take 1 tablet by mouth 2 (two) times daily.    calcium carbonate (OS-CAL) 600 MG TABS tablet Take 600 mg by mouth daily with breakfast.    chlorhexidine (PERIDEX) 0.12 % solution by Mouth Rinse route 2 (two) times daily. Rinse and  spit 2 times a day for 30 seconds. Refills: 12    citalopram (CELEXA) 20 MG tablet Take 20 mg by mouth daily with lunch.     diphenhydramine-acetaminophen (TYLENOL PM) 25-500 MG TABS Take 1 tablet by mouth at bedtime as needed (for sleep.).     doxazosin (CARDURA) 4 MG tablet Take 4 mg by mouth at bedtime.     esomeprazole (NEXIUM) 40 MG capsule Take 40 mg by mouth daily before breakfast.    ezetimibe (ZETIA) 10 MG tablet Take 10 mg by mouth at bedtime.     fluticasone (FLONASE) 50 MCG/ACT nasal spray Place 1-2 sprays into both  nostrils at bedtime.     gabapentin (NEURONTIN) 100 MG capsule Take 100 mg by mouth 2 (two) times daily.     metoCLOPramide (REGLAN) 5 MG tablet Take 5 mg by mouth 2 (two) times daily. Take before breakfast and supper.    Multiple Vitamin (MULTIVITAMIN) tablet Take 1 tablet by mouth every other day.     mupirocin ointment (BACTROBAN) 2 % Apply 1 application topically daily as needed (for sore on leg.).     nebivolol (BYSTOLIC) 5 MG tablet Take 1 tablet (5 mg total) by mouth daily. Qty: 30 tablet, Refills: 0    nitroGLYCERIN (NITROSTAT) 0.4 MG SL tablet PLACE 1 TABLET (0.4 MG TOTAL) UNDER THE TONGUE EVERY 5 (FIVE) MINUTES AS NEEDED FOR CHEST PAIN. Qty: 25 tablet, Refills: 3    promethazine (PHENERGAN) 25 MG tablet Take 25 mg by mouth every 8 (eight) hours as needed. For nausea and vomiting. Refills: 0    SPIRIVA RESPIMAT 1.25 MCG/ACT AERS Inhale 1 puff into the lungs daily. Refills: 5    spironolactone (ALDACTONE) 50 MG tablet Take 25 mg by mouth every Monday, Wednesday, and Friday. Takes 1 tablet Mon. Wednesday and Friday.    arformoterol (BROVANA) 15 MCG/2ML NEBU Take 2 mLs (15 mcg total) by nebulization 2 (two) times daily. Qty: 120 mL, Refills: 4    chlorthalidone (HYGROTON) 25 MG tablet Take 1 tablet (25 mg total) by mouth daily as needed. Qty: 30 tablet, Refills: 6      STOP taking these medications     albuterol (PROAIR HFA) 108 (90 BASE)  MCG/ACT inhaler      mometasone-formoterol (DULERA) 100-5 MCG/ACT AERO      tiotropium (SPIRIVA) 18 MCG inhalation capsule          Today    VITAL SIGNS:  Blood pressure 132/50, pulse 68, temperature 98.3 F (36.8 C), temperature source Oral, resp. rate 18, height 5\' 4"  (1.626 m), weight 71.396 kg (157 lb 6.4 oz), SpO2 91 %.  I/O:   Intake/Output Summary (Last 24 hours) at 11/21/15 1017 Last data filed at 11/20/15 1700  Gross per 24 hour  Intake    290 ml  Output      0 ml  Net    290 ml    PHYSICAL EXAMINATION:  Physical Exam  GENERAL:  82 y.o.-year-old patient lying in the bed with no acute distress. Thin LUNGS: No rales,rhonchi or crepitation. No use of accessory muscles of respiration. Mild exp wheezes CARDIOVASCULAR: S1, S2 normal. No murmurs, rubs, or gallops.  ABDOMEN: Soft, non-tender, non-distended. Bowel sounds present. No organomegaly or mass.  NEUROLOGIC: Moves all 4 extremities. PSYCHIATRIC: The patient is alert and oriented x 3.  SKIN: No obvious rash, lesion, or ulcer.   DATA REVIEW:   CBC  Recent Labs Lab 11/21/15 0541  WBC 13.0*  HGB 10.5*  HCT 32.0*  PLT 381    Chemistries   Recent Labs Lab 11/21/15 0541  NA 123*  K 3.4*  CL 70*  CO2 43*  GLUCOSE 151*  BUN 28*  CREATININE 0.63  CALCIUM 8.9  MG 1.8    Cardiac Enzymes No results for input(s): TROPONINI in the last 168 hours.  Microbiology Results  Results for orders placed or performed during the hospital encounter of 11/13/15  MRSA PCR Screening     Status: None   Collection Time: 11/13/15  5:28 PM  Result Value Ref Range Status   MRSA by PCR NEGATIVE NEGATIVE Final    Comment:  The GeneXpert MRSA Assay (FDA approved for NASAL specimens only), is one component of a comprehensive MRSA colonization surveillance program. It is not intended to diagnose MRSA infection nor to guide or monitor treatment for MRSA infections.     RADIOLOGY:  Dg Chest 2  View  11/20/2015  CLINICAL DATA:  CHF, hypoxia EXAM: CHEST  2 VIEW COMPARISON:  11/15/2015 FINDINGS: Cardiomegaly. No focal airspace opacities. Mild interstitial prominence and hyperinflation again noted, unchanged. No effusions. No acute bony abnormality. IMPRESSION: Cardiomegaly. Mild COPD/interstitial prominence.  No change. Electronically Signed   By: Charlett Nose M.D.   On: 11/20/2015 11:10      Follow up with PCP in 1 week.  Management plans discussed with the patient, family and they are in agreement.  CODE STATUS:     Code Status Orders        Start     Ordered   11/13/15 2013  Full code   Continuous     11/13/15 2015    Code Status History    Date Active Date Inactive Code Status Order ID Comments User Context   07/28/2015 10:24 PM 07/29/2015  9:42 PM DNR [id number]  Wyatt Haste, MD ED   04/10/2015 11:23 PM 04/13/2015  5:49 PM Full Code [id number]  Wyatt Haste, MD ED   03/31/2015  2:26 PM 04/04/2015  4:15 PM Full Code [id number]  Houston Siren, MD Inpatient    Advance Directive Documentation        Most Recent Value   Type of Advance Directive  Healthcare Power of Attorney, Living will   Pre-existing out of facility DNR order (yellow form or pink MOST form)     "MOST" Form in Place?        TOTAL TIME TAKING CARE OF THIS PATIENT ON DAY OF DISCHARGE: more than 30 minutes.    Milagros Loll R M.D on 11/21/2015 at 10:17 AM  Between 7am to 6pm - Pager - [phone]  After 6pm go to [email or web address] - password [email or web address] [facility]  [place] [facility]  Office  [phone]  CC: Primary care physician; Marisue Ivan, MD     Note: This dictation was prepared with Dragon dictation along with smaller phrase technology. Any transcriptional errors that result from this process are unintentional.

## 2015-11-21 NOTE — Care Management Important Message (Signed)
Important Message  Patient Details  Name: Catherine Holmes MRN: [id number] Date of Birth: [birth year]   Medicare Important Message Given:  Yes    Olegario Messier A Malik Ruffino 11/21/2015, 11:24 AM

## 2015-11-21 NOTE — [place] FL2 (Signed)
[place] MEDICAID FL2 LEVEL OF CARE SCREENING TOOL     IDENTIFICATION  Patient Name: Catherine Holmes Birthdate: 1928-01-12 Sex: female Admission Date (Current Location): 11/13/2015  [place] and [place] Number:  Chiropodist and Address:  [facility], [address], [place], [place] [address]      Provider Number: [id number]  Attending Physician Name and Address:  Milagros Loll, MD  Relative Name and Phone Number:       Current Level of Care: Hospital Recommended Level of Care: Skilled Nursing Facility Prior Approval Number:    Date Approved/Denied:   PASRR Number: [id number]  Discharge Plan: SNF    Current Diagnoses: Patient Active Problem List   Diagnosis Date Noted  . Pressure ulcer 11/14/2015  . COPD exacerbation (HCC) 11/13/2015  . Acute on chronic respiratory failure (HCC) 11/13/2015  . Anxiety and depression 11/10/2015  . Iron deficiency anemia 10/28/2015  . Musculoskeletal chest pain 07/29/2015  . Sternal fracture 07/29/2015  . Pneumonia 07/29/2015  . Acute on chronic respiratory failure with hypoxia and hypercapnia (HCC) 07/28/2015  . Hyponatremia 04/11/2015  . Syncope 04/10/2015  . Absolute anemia   . Acute on chronic diastolic CHF (congestive heart failure) (HCC)   . CHF (congestive heart failure) (HCC) 03/31/2015  . Essential hypertension   . Diabetes mellitus type II, controlled (HCC)   . Hyposmolality and/or hyponatremia 12/04/2013  . Anemia 12/04/2013  . Chronic diastolic CHF (congestive heart failure) (HCC) 04/05/2013  . Positive fecal occult blood test 04/05/2013  . Fall 11/20/2012  . Chest tightness 07/13/2011  . Diabetes type 2, uncontrolled (HCC) 11/29/2009  . Hyperlipidemia 11/29/2009  . TIC DOULOUREUX 11/29/2009  . HYPERTENSION, BENIGN 11/29/2009  . Mitral valve disorder 11/29/2009  . Congestive heart failure (HCC) 11/29/2009  . Carotid arterial disease (HCC) 11/29/2009  . TIA 11/29/2009  . PAD  (peripheral artery disease) (HCC) 11/29/2009  . EMPHYSEMA 11/29/2009  . COPD (chronic obstructive pulmonary disease) (HCC) 11/29/2009  . GERD 11/29/2009  . SJOGREN'S SYNDROME 11/29/2009  . ARTHRALGIA 11/29/2009  . OSTEOPOROSIS 11/29/2009  . DYSPNEA 11/29/2009    Orientation RESPIRATION BLADDER Height & Weight     Self, Time, Situation, Place  O2 (2L) Incontinent Weight: 157 lb 6.4 oz (71.396 kg) Height:   (162.6 cm)  BEHAVIORAL SYMPTOMS/MOOD NEUROLOGICAL BOWEL NUTRITION STATUS      Incontinent Diet (Heart Healthy, Thin Liquids)  AMBULATORY STATUS COMMUNICATION OF NEEDS Skin   Limited Assist Verbally Normal                       Personal Care Assistance Level of Assistance  Bathing, Feeding, Dressing Bathing Assistance: Limited assistance Feeding assistance: Limited assistance Dressing Assistance: Limited assistance     Functional Limitations Info  Sight, Hearing, Speech Sight Info: Adequate Hearing Info: Adequate Speech Info: Adequate    SPECIAL CARE FACTORS FREQUENCY  PT (By licensed PT) (5)                    Contractures      Additional Factors Info  Code Status, Allergies Code Status Info: Full Code Allergies Info: Allergies: Atorvastatin, Clindamycin, Codeine, Doxycycline, Epinephrine, Erythromycin, Fluticasone-salmeterol, Lasix, Levofloxacin, Penicillins, Propoxyphene Hcl, Rosuvastatin, Simvastatin, Singlet, Sulfamethoxazole-trimethoprim, Teriparatide, Tetracyclines & Related, Cefaclor, Glipizide, Lisinopril           Current Medications (11/21/2015):  This is the current hospital active medication list Current Facility-Administered Medications  Medication Dose Route Frequency Provider Last Rate Last Dose  .  ALPRAZolam Prudy Feeler) tablet 0.25 mg  0.25 mg Oral TID PRN Altamese Dilling, MD   0.25 mg at 11/21/15 1029  . alum & mag hydroxide-simeth (MAALOX/MYLANTA) 200-200-20 MG/5ML suspension 30 mL  30 mL Oral Q6H PRN Katha Hamming, MD       . arformoterol (BROVANA) nebulizer solution 15 mcg  15 mcg Nebulization BID Katha Hamming, MD   15 mcg at 11/21/15 0919  . aspirin EC tablet 81 mg  81 mg Oral Daily Katha Hamming, MD   81 mg at 11/21/15 1028  . bisacodyl (DULCOLAX) EC tablet 5 mg  5 mg Oral Daily PRN Katha Hamming, MD      . budesonide (PULMICORT) nebulizer solution 0.5 mg  0.5 mg Nebulization BID Erin Fulling, MD   0.5 mg at 11/21/15 0919  . bumetanide (BUMEX) injection 1 mg  1 mg Intravenous Once Arnaldo Natal, MD   1 mg at 11/13/15 2202  . [START ON 11/22/2015] bumetanide (BUMEX) tablet 2 mg  2 mg Oral Daily Srikar Sudini, MD      . calcium carbonate (TUMS - dosed in mg elemental calcium) chewable tablet 1,500 mg  1,500 mg Oral Q breakfast Katha Hamming, MD   1,500 mg at 11/20/15 1125  . chlorhexidine (PERIDEX) 0.12 % solution 10 mL  10 mL Mouth Rinse BID Katha Hamming, MD   10 mL at 11/21/15 1030  . chlorthalidone (HYGROTON) tablet 25 mg  25 mg Oral Daily Katha Hamming, MD   25 mg at 11/21/15 1029  . citalopram (CELEXA) tablet 20 mg  20 mg Oral Q lunch Katha Hamming, MD   20 mg at 11/21/15 1146  . docusate sodium (COLACE) capsule 100 mg  100 mg Oral BID Katha Hamming, MD   100 mg at 11/20/15 2202  . doxazosin (CARDURA) tablet 4 mg  4 mg Oral QHS Katha Hamming, MD   4 mg at 11/20/15 2154  . ezetimibe (ZETIA) tablet 10 mg  10 mg Oral QHS Katha Hamming, MD   10 mg at 11/20/15 2155  . feeding supplement (ENSURE ENLIVE) (ENSURE ENLIVE) liquid 237 mL  237 mL Oral BID BM Altamese Dilling, MD   237 mL at 11/19/15 1531  . fluticasone (FLONASE) 50 MCG/ACT nasal spray 1-2 spray  1-2 spray Each Nare QHS Katha Hamming, MD   2 spray at 11/20/15 2214  . gabapentin (NEURONTIN) capsule 100 mg  100 mg Oral BID Katha Hamming, MD   100 mg at 11/21/15 1028  . guaiFENesin-dextromethorphan (ROBITUSSIN DM) 100-10 MG/5ML syrup 5 mL  5 mL Oral Q4H PRN Srikar Sudini, MD   5 mL  at 11/20/15 2345  . heparin injection 5,000 Units  5,000 Units Subcutaneous 3 times per day Katha Hamming, MD   5,000 Units at 11/21/15 0640  . hydrALAZINE (APRESOLINE) injection 20 mg  20 mg Intravenous Q6H PRN Vishal Mungal, MD      . insulin aspart (novoLOG) injection 0-5 Units  0-5 Units Subcutaneous QHS Stephanie Acre, MD   4 Units at 11/20/15 2207  . insulin aspart (novoLOG) injection 0-9 Units  0-9 Units Subcutaneous TID WC Vishal Mungal, MD   2 Units at 11/21/15 1146  . ipratropium-albuterol (DUONEB) 0.5-2.5 (3) MG/3ML nebulizer solution 3 mL  3 mL Nebulization Once Arnaldo Natal, MD   Stopped at 11/13/15 1755  . ipratropium-albuterol (DUONEB) 0.5-2.5 (3) MG/3ML nebulizer solution 3 mL  3 mL Nebulization Once Arnaldo Natal, MD   Stopped at 11/13/15 1755  . ipratropium-albuterol (DUONEB) 0.5-2.5 (3)  MG/3ML nebulizer solution 3 mL  3 mL Nebulization Q4H PRN Vishal Mungal, MD   3 mL at 11/18/15 1446  . magnesium oxide (MAG-OX) tablet 400 mg  400 mg Oral Daily Milagros Loll, MD   400 mg at 11/21/15 1029  . menthol-cetylpyridinium (CEPACOL) lozenge 3 mg  1 lozenge Oral PRN Srikar Sudini, MD      . metoCLOPramide (REGLAN) tablet 5 mg  5 mg Oral BID Katha Hamming, MD   5 mg at 11/21/15 1028  . multivitamin with minerals tablet 1 tablet  1 tablet Oral QODAY Katha Hamming, MD   1 tablet at 11/18/15 1002  . multivitamin-lutein (OCUVITE-LUTEIN) capsule 1 capsule  1 capsule Oral BID Katha Hamming, MD   1 capsule at 11/18/15 1118  . nebivolol (BYSTOLIC) tablet 5 mg  5 mg Oral Daily Katha Hamming, MD   5 mg at 11/21/15 1029  . ondansetron (ZOFRAN) tablet 4 mg  4 mg Oral Q6H PRN Katha Hamming, MD       Or  . ondansetron (ZOFRAN) injection 4 mg  4 mg Intravenous Q6H PRN Katha Hamming, MD   4 mg at 11/19/15 1840  . pantoprazole (PROTONIX) EC tablet 40 mg  40 mg Oral BID Vishal Mungal, MD   40 mg at 11/21/15 1028  . potassium chloride (KLOR-CON) packet 20 mEq  20  mEq Oral Once Milagros Loll, MD   20 mEq at 11/21/15 1030  . potassium chloride SA (K-DUR,KLOR-CON) CR tablet 20 mEq  20 mEq Oral Daily Milagros Loll, MD   20 mEq at 11/21/15 1000  . potassium chloride SA (K-DUR,KLOR-CON) CR tablet 20 mEq  20 mEq Oral Once Vishal Mungal, MD   20 mEq at 11/21/15 1000  . predniSONE (DELTASONE) tablet 40 mg  40 mg Oral Q breakfast Erin Fulling, MD   40 mg at 11/21/15 1028  . sodium chloride (OCEAN) 0.65 % nasal spray 1 spray  1 spray Each Nare PRN Srikar Sudini, MD      . spironolactone (ALDACTONE) tablet 25 mg  25 mg Oral Q M,W,F Katha Hamming, MD   25 mg at 11/21/15 1029  . tiotropium (SPIRIVA) inhalation capsule 18 mcg  18 mcg Inhalation Daily Katha Hamming, MD   18 mcg at 11/20/15 1127  . traZODone (DESYREL) tablet 25 mg  25 mg Oral QHS PRN Katha Hamming, MD   25 mg at 11/20/15 2346   Facility-Administered Medications Ordered in Other Encounters  Medication Dose Route Frequency Provider Last Rate Last Dose  . 0.9 %  sodium chloride infusion   Intravenous Once Rosey Bath, MD      . alteplase (CATHFLO ACTIVASE) injection 2 mg  2 mg Intracatheter Once PRN Rosey Bath, MD      . ferumoxytol (FERAHEME) 510 mg in sodium chloride 0.9 % 100 mL IVPB  510 mg Intravenous Once Rosey Bath, MD      . heparin lock flush 100 unit/mL  500 Units Intracatheter Once PRN Rosey Bath, MD      . heparin lock flush 100 unit/mL  250 Units Intracatheter Once PRN Rosey Bath, MD      . sodium chloride 0.9 % injection 10 mL  10 mL Intracatheter PRN Rosey Bath, MD      . sodium chloride 0.9 % injection 3 mL  3 mL Intravenous Once PRN Rosey Bath, MD         Discharge Medications: Please see discharge summary for a list of discharge  medications.  Relevant Imaging Results:  Relevant Lab Results:   Additional Information SSN:  [id number]  Dede Query, LCSW

## 2015-11-21 NOTE — Progress Notes (Signed)
Pharmacy Consult for Electrolyte Monitoring and Replacement  Allergies  Allergen Reactions  . Atorvastatin   . Clindamycin Diarrhea, Other (See Comments) and Nausea Only  . Codeine   . Doxycycline   . Epinephrine Hives and Itching  . Erythromycin   . Fluticasone-Salmeterol   . Lasix [Furosemide] Itching  . Levofloxacin   . Penicillins   . Propoxyphene Hcl   . Rosuvastatin   . Simvastatin   . Singlet [Chlorphen-Pe-Acetaminophen] Other (See Comments)    Reaction: Wheezing  . Sulfamethoxazole-Trimethoprim   . Teriparatide   . Tetracyclines & Related Other (See Comments)    unknown  . Cefaclor Rash and Other (See Comments)    Reaction: Delirious    . Glipizide Hives  . Lisinopril Hives and Itching    Patient Measurements: Height:  (162.6 cm) Weight: 157 lb 6.4 oz (71.396 kg) IBW/kg (Calculated) : 54.7  Vital Signs: Temp: 98.3 F (36.8 C) (01/30 0446) Temp Source: Oral (01/30 0446) BP: 132/50 mmHg (01/30 0446) Pulse Rate: 68 (01/30 0447) Intake/Output from previous day: 01/29 0701 - 01/30 0700 In: 290 [P.O.:240; IV Piggyback:50] Out: -  Intake/Output from this shift:    Labs:  Recent Labs  11/21/15 0541  WBC 13.0*  HGB 10.5*  HCT 32.0*  PLT 381     Recent Labs  11/19/15 0452 11/20/15 0546 11/21/15 0541  NA 129* 125* 123*  K 3.5 3.6 3.4*  CL 79* 72* 70*  CO2 42* 43* 43*  GLUCOSE 136* 141* 151*  BUN 25* 29* 28*  CREATININE 0.60 0.67 0.63  CALCIUM 8.6* 8.8* 8.9  MG  --  1.2* 1.8  PHOS  --  3.1 3.0   Estimated Creatinine Clearance: 49.8 mL/min (by C-G formula based on Cr of 0.63).    Recent Labs  11/20/15 1643 11/20/15 2140 11/21/15 0747  GLUCAP 197* 344* 135*    Medical History: Past Medical History  Diagnosis Date  . Essential hypertension   . Diabetes mellitus type II, controlled (HCC)   . COPD (chronic obstructive pulmonary disease) (HCC)   . Osteoporosis   . Carotid arterial disease (HCC)     a. 2012 Carotid dopplers:  40-59% R ICA, 60-79% L ICA  . Tic douloureux   . Arthralgia   . Sjoegren syndrome (HCC)   . TIA (transient ischemic attack)   . Emphysema   . MVP (mitral valve prolapse)   . Chronic diastolic CHF (congestive heart failure) (HCC)     a. 08/2011 Echo: EF 65-70%, mild LVH, mod dil LA, mildly dil RA, mild MR, mild-mod AoV Sclerosis w/o stenosis, mod-sev elev PA pressures, mild TR.  Marland Kitchen Acid reflux   . Diverticulitis   . Anemia   . History of pneumonia   . Syncope and collapse   . Anemia   . Hip fracture, left (HCC)   . Hyperlipidemia   . Hiatal hernia   . Chest pain     a. 08/2011 MV: EF 74%, no ischemia.  . Noncompliance     a. h/o not taking bumex as Rx.  Marland Kitchen PAF (paroxysmal atrial fibrillation) (HCC)     a. CHA2DS2VASc = 8 ->No OAC 2/2 falls.    Medications:  Scheduled:  . arformoterol  15 mcg Nebulization BID  . aspirin EC  81 mg Oral Daily  . budesonide (PULMICORT) nebulizer solution  0.5 mg Nebulization BID  . bumetanide (BUMEX) IV  1 mg Intravenous Once  . [START ON 11/22/2015] bumetanide  2 mg Oral Daily  .  calcium carbonate  1,500 mg Oral Q breakfast  . chlorhexidine  10 mL Mouth Rinse BID  . chlorthalidone  25 mg Oral Daily  . citalopram  20 mg Oral Q lunch  . docusate sodium  100 mg Oral BID  . doxazosin  4 mg Oral QHS  . ezetimibe  10 mg Oral QHS  . feeding supplement (ENSURE ENLIVE)  237 mL Oral BID BM  . fluticasone  1-2 spray Each Nare QHS  . gabapentin  100 mg Oral BID  . heparin  5,000 Units Subcutaneous 3 times per day  . insulin aspart  0-5 Units Subcutaneous QHS  . insulin aspart  0-9 Units Subcutaneous TID WC  . ipratropium-albuterol  3 mL Nebulization Once  . ipratropium-albuterol  3 mL Nebulization Once  . magnesium oxide  400 mg Oral Daily  . metoCLOPramide  5 mg Oral BID  . multivitamin with minerals  1 tablet Oral QODAY  . multivitamin-lutein  1 capsule Oral BID  . nebivolol  5 mg Oral Daily  . pantoprazole  40 mg Oral BID  . potassium chloride   20 mEq Oral Daily  . potassium chloride  20 mEq Oral Once  . predniSONE  40 mg Oral Q breakfast  . spironolactone  25 mg Oral Q M,W,F  . tiotropium  18 mcg Inhalation Daily   Infusions:    Assessment: Pharmacy consulted to assist in managing electrolytes in this 82 y/o F admitted with COPD exacerbation.   Plan:  Magnesium is low at 1.2 so will replace with 4 grams of magnesium sulfate iv once and f/u am labs.   1/30:  K = 3.4.  Patient on KDUR daily and Mag-Ox  daily and spironolactone 25 mg daily. Also Bumex  daily and Chlorthalidone daily.   Will give KDUR x 1 dose today. F/U am labs.  Catherine Holmes A 11/21/2015,9:31 AM

## 2015-11-22 ENCOUNTER — Telehealth: Payer: Self-pay | Admitting: *Deleted

## 2015-11-22 NOTE — Clinical Social Work Placement (Signed)
   CLINICAL SOCIAL WORK PLACEMENT  NOTE  Date:  11/22/2015  Patient Details  Name: Catherine Holmes MRN: [id number] Date of Birth: [birth year]  Clinical Social Work is seeking post-discharge placement for this patient at the Skilled  Nursing Facility level of care (*CSW will initial, date and re-position this form in  chart as items are completed):  Yes   Patient/family provided with [facility] Clinical Social Work Department's list of facilities offering this level of care within the geographic area requested by the patient (or if unable, by the patient's family).  Yes   Patient/family informed of their freedom to choose among providers that offer the needed level of care, that participate in Medicare, Medicaid or managed care program needed by the patient, have an available bed and are willing to accept the patient.  Yes   Patient/family informed of [facility]'s ownership interest in [facility] and [facility], as well as of the fact that they are under no obligation to receive care at these facilities.  PASRR submitted to EDS on       PASRR number received on       Existing PASRR number confirmed on 11/21/15     FL2 transmitted to all facilities in geographic area requested by pt/family on 11/21/15     FL2 transmitted to all facilities within larger geographic area on       Patient informed that his/her managed care company has contracts with or will negotiate with certain facilities, including the following:        Yes   Patient/family informed of bed offers received.  Patient chooses bed at [facility]     Physician recommends and patient chooses bed at  [facility])    Patient to be transferred to [facility] on 11/21/15.  Patient to be transferred to facility by [facility]     Patient family notified on 11/21/15 of transfer.  Name of family member notified:  Eunice Blase, daughter     PHYSICIAN       Additional Comment:     _______________________________________________ Dede Query, LCSW 11/22/2015, 9:47 PM

## 2015-11-22 NOTE — Clinical Social Work Note (Signed)
Clinical Social Work Assessment  Patient Details  Name: Catherine Holmes MRN: [id number] Date of Birth: [birth year]  Date of referral:  11/21/15               Reason for consult:  Facility Placement                Permission sought to share information with:  Family Supports Permission granted to share information::  Yes, Verbal Permission Granted  Name::     Daughter, [name]   Housing/Transportation Living arrangements for the past 2 months:  Single Family Home Source of Information:  Patient, Adult Children Patient Interpreter Needed:  None Criminal Activity/Legal Involvement Pertinent to Current Situation/Hospitalization:  No - Comment as needed Significant Relationships:  Adult Children, Other Family Members Lives with:  Self Do you feel safe going back to the place where you live?  Yes Need for family participation in patient care:  Yes (Comment)  Care giving concerns:  No caregiver concerns identified.   Social Worker assessment / plan:  CSW met with pt and daughter to address consult. CSW introduced herself and explained role of social work. CSW also explained the process of discharging to SNF. Pt is ready for discharge today. Pt's preference is [facility]. CSW initiated SNF search and called [facility]. [facility] was able to accept pt today. CSW provide bed offers to daughter and updated pt. [facility] was chosen and CSW updated facility. Facility was able to accept pt as they have received discharge information. RN to call report and EMS to provide transportation.  CSW is signing off as no further needs identified.    Employment status:  Retired Forensic scientist:  Medicare PT Recommendations:  [place] / Referral to community resources:  [place]  Patient/Family's Response to care:  Pt and family were appreciative of CSW support.   Patient/Family's Understanding of and Emotional Response to Diagnosis,  Current Treatment, and Prognosis:  Pt and daughter understand that pt needs a higher level of care prior to returning home.   Emotional Assessment Appearance:  Appears stated age Attitude/Demeanor/Rapport:  Other (Appropriate) Affect (typically observed):  Accepting, Adaptable, Pleasant Orientation:  Oriented to Self, Oriented to Place, Oriented to  Time, Oriented to Situation Alcohol / Substance use:  Never Used Psych involvement (Current and /or in the community):  No (Comment)  Discharge Needs  Concerns to be addressed:  Adjustment to Illness Readmission within the last 30 days:  No Current discharge risk:  Chronically ill Barriers to Discharge:  No Barriers Identified   Darden Dates, LCSW 11/22/2015, 9:41 PM

## 2015-11-22 NOTE — Telephone Encounter (Signed)
Pt daughter calling stating pt is now in nursing home and since being out of hospital she is concerned on a few medication changes they did while being in there  Please call.

## 2015-11-22 NOTE — Telephone Encounter (Signed)
Spoke w/ Eunice Blase.  She reports that pt's meds were changed while hospitalized. She reports that pt's Bumex was increased, pt's legs are "just as flat as they can be, she doesn't have any swelling". She is concerned about pt's spironolactone dosage, as well. Advised her that our office was not consulted while pt was in the hospital, so Dr. Mariah Milling did not adjust meds. Dr. Dareen Piano is sched to see pt tomorrow @ [facility] Eunice Blase admits that she is tired, has not had much sleep, she is unsure of what she is asking, just concerned about dosages. Advised her I will make Dr. Mariah Milling aware of her concerns.

## 2015-11-23 NOTE — Telephone Encounter (Signed)
Would recommend as soon as she has the strength, would come for an appointment. Transportation can be arranged from there to the office. We can do lab work, adjust medications if needed

## 2015-11-24 NOTE — Telephone Encounter (Signed)
Pt has appt next week, 12/01/15.

## 2015-12-01 ENCOUNTER — Encounter: Payer: Self-pay | Admitting: Cardiovascular Disease

## 2015-12-01 ENCOUNTER — Ambulatory Visit (INDEPENDENT_AMBULATORY_CARE_PROVIDER_SITE_OTHER): Payer: Medicare Other | Admitting: Cardiovascular Disease

## 2015-12-01 VITALS — BP 135/60 | HR 82 | Ht 60.0 in | Wt 152.0 lb

## 2015-12-01 DIAGNOSIS — I059 Rheumatic mitral valve disease, unspecified: Secondary | ICD-10-CM | POA: Diagnosis not present

## 2015-12-01 DIAGNOSIS — I739 Peripheral vascular disease, unspecified: Secondary | ICD-10-CM

## 2015-12-01 DIAGNOSIS — G459 Transient cerebral ischemic attack, unspecified: Secondary | ICD-10-CM

## 2015-12-01 DIAGNOSIS — J438 Other emphysema: Secondary | ICD-10-CM

## 2015-12-01 DIAGNOSIS — I10 Essential (primary) hypertension: Secondary | ICD-10-CM

## 2015-12-01 DIAGNOSIS — J449 Chronic obstructive pulmonary disease, unspecified: Secondary | ICD-10-CM

## 2015-12-01 DIAGNOSIS — R55 Syncope and collapse: Secondary | ICD-10-CM

## 2015-12-01 DIAGNOSIS — J441 Chronic obstructive pulmonary disease with (acute) exacerbation: Secondary | ICD-10-CM

## 2015-12-01 DIAGNOSIS — I509 Heart failure, unspecified: Secondary | ICD-10-CM | POA: Diagnosis not present

## 2015-12-01 DIAGNOSIS — I5032 Chronic diastolic (congestive) heart failure: Secondary | ICD-10-CM

## 2015-12-01 NOTE — Progress Notes (Signed)
Patient ID: Catherine Holmes, female    DOB: Sep 18, 1928, 82 y.o.   MRN: [id number]  HPI Comments: 82 year-old woman with a history of DM,  peripheral vascular disease, 60 to 70% carotid arterial disease, moderate arterial disease of the lower extremities, status post bilateral iliac stenting, moderate renal stenosis, hyperlipidemia, history of severe  hypertension with labile blood pressures, history of episodic shortness of breath and chest squeezing relieved with albuterol/nebs who presents for routine followup of her chronic diastolic CHF  She presents today from [facility] after recent discharge from the hospital, she is currently in rehabilitation She was in the hospital for acute respiratory distress, COPD exacerbation, pneumonia Long hospital course with respiratory support, BiPAP, antibiotics, steroids It was felt her condition was not secondary to CHF and cardiology was not consulted Through her hospital course, she became very deconditioned  She saw Dr. Welton Flakes, pulmonary, today in clinic, chest x-ray has been ordered as well as speech and swallow Family presents with her today in clinic and is unclear if medications have been given in a timely fashion at the rehabilitation facility We did call today to [facility] and they reported she is receiving Bumex and Aldactone daily, chlorthalidone as needed She has no significant leg edema, No recent lab work available Still with a significant cough, strength is slowly coming back She is working with PT and OT   Other past medical history She has a sulfa allergy to loop diuretics.  unable to take Lasix  History of falls.   left carotid arterial stenosis estimated at 60-79% disease. She has had several admissions for diastolic CHF, typically from dietary indiscretion and noncompliance with diuretics She was admitted on 11/02/2013 , discharged on 11/16/2013  admitted on 01/24/2014, discharge from 01/27/2014 for acute respiratory failure  secondary to COPD History of anemia followed by hematology  Over the summer 2016, she had severe leg edema extending up her thighs, shortness of breath, abdominal swelling We recommended Bumex 2 mg twice a day Persistent edemasince that time  hospitalized  September 2016 at an outside facility for pneumonia and sepsis.  she underwent the chest compressions CPR for what that looks like a vasovagal episode and had a sternal fracture. We presented to the hospital early October 2016 with hypoxia, shortness of breath , inadequate pain control /Chest pain. sodium level of 129, mild anemia with hemoglobin level of 10.5.    CT scan of the chest showed no pulmonary embolism. However, is based consolidation in both lung bases, more on the right than on the left with small right pleural effusion was noted, as well as stable collapse of T8 and T12 vertebral bodies  she was treated for pneumonia , discharged to [facility] rehabilitation   During her time in [place], she had dramatic weight gain, lower extremity edema  We received a call us morning that she was going to acute care.  We have recommended that she come to the office for urgent evaluation  On evaluation today, she has 2+ pitting edema to her thighs.  She  Reports mildshortness of breath,  abdominal bloating  Significant sores and ecchymoses of her legs.  Compression wraps in place  She has been taking Bumex sparingly  she feels that she needs a iron infusion.  Hemoglobin down to 8.8.   Previous hospital  admission for acute on chronic diastolic CHF. Improvement of her symptoms with aggressive diuresis. Readmitted this past week to the hospital with hypotension. She has persistent anemia  had iron infusions in the past.   Followed by Dr. Cherylann Ratel for her kidney issues  She stopped her amiodarone out of concern for possible pulmonary complications. Denies having any palpitations She does report having rare episodes of near syncope wall in a  sitting position. Comes across her like a wave it resolves relatively quickly  She was in the hospital 05/05/2014 with hypertensive urgency, hyponatremia. Blood pressure is 201/98 and she was put back on her outpatient regiment with improvement of her symptoms. She suffered a fractured hip on 05/07/2014 and we were consulted for preop evaluation. Found to have elevated at her pressures at 65. She was discharged on 05/17/2014 EKGs from her hospital course showed she had atrial fibrillation on 05/09/2014  Previous Echocardiogram in the hospital showed normal ejection fraction, moderate to severe, hypertension, moderately dilated left atrium  Has a history of large hiatal hernia She is not able to tolerate statins. She does handle zetia without significant side effects. previously encouraged to take red yeast rice.   Previous weight October 2014 was 148 pounds.  weight at the end of December 2014 was 160 pounds, she had increasing shortness of breath, had not been taking Bumex    in the hospital at [facility] in 2012 for general malaise, severe hypertension, shortness of breath, chest pain. She had a stress test, lexiscan that showed no significant ischemia. Significant shortness of breath with lexiscan.   hospital admission on 01/08/2012 for shortness of breath and syncope. She was found to have significant anemia. Treated with Lasix for diastolic CHF.  Underlying severe left carotid arterial disease estimated at 70%.   Prior followup with Dr. Welton Flakes in pulmonary with numerous CT scans, MRIs, PFTs, sleep study. She was told that she had hypoxemia overnight and was started on nighttime oxygen.    hospitalization 05/27/2013 for acute on chronic diastolic CHF. She was treated with IV Bumex twice a day with improvement of her symptoms. Getting factor was her anemia with hematocrit of 25.    Outpatient Encounter Prescriptions as of 12/01/2015  Medication Sig  . albuterol (PROVENTIL) (2.5 MG/3ML) 0.083%  nebulizer solution Take 2.5 mg by nebulization every 6 (six) hours as needed for wheezing or shortness of breath.   . ALPRAZolam (XANAX) 0.25 MG tablet Take 1 tablet (0.25 mg total) by mouth 3 (three) times daily as needed for anxiety.  Marland Kitchen arformoterol (BROVANA) 15 MCG/2ML NEBU Take 2 mLs (15 mcg total) by nebulization 2 (two) times daily.  Marland Kitchen aspirin EC 81 MG tablet Take 81 mg by mouth daily.  . beta carotene w/minerals (OCUVITE) tablet Take 1 tablet by mouth 2 (two) times daily.  . budesonide (PULMICORT) 0.5 MG/2ML nebulizer solution Take 2 mLs (0.5 mg total) by nebulization 2 (two) times daily.  . bumetanide (BUMEX) 2 MG tablet Take 1 tablet (2 mg total) by mouth daily.  . calcium carbonate (OS-CAL) 600 MG TABS tablet Take 600 mg by mouth daily with breakfast.  . chlorhexidine (PERIDEX) 0.12 % solution by Mouth Rinse route 2 (two) times daily. Rinse and spit 2 times a day for 30 seconds.  . chlorthalidone (HYGROTON) 25 MG tablet Take 1 tablet (25 mg total) by mouth daily as needed.  . citalopram (CELEXA) 20 MG tablet Take 20 mg by mouth daily with lunch.   . diphenhydramine-acetaminophen (TYLENOL PM) 25-500 MG TABS Take 1 tablet by mouth at bedtime as needed (for sleep.).   Marland Kitchen docusate sodium (COLACE) 100 MG capsule Take 1 capsule (100 mg total) by  mouth 2 (two) times daily.  Marland Kitchen doxazosin (CARDURA) 4 MG tablet Take 4 mg by mouth at bedtime.   Marland Kitchen esomeprazole (NEXIUM) 40 MG capsule Take 40 mg by mouth daily before breakfast.  . ezetimibe (ZETIA) 10 MG tablet Take 10 mg by mouth at bedtime.   . fluticasone (FLONASE) 50 MCG/ACT nasal spray Place 1-2 sprays into both nostrils at bedtime.   . gabapentin (NEURONTIN) 100 MG capsule Take 100 mg by mouth 2 (two) times daily.   Marland Kitchen guaiFENesin-dextromethorphan (ROBITUSSIN DM) 100-10 MG/5ML syrup Take 5 mLs by mouth every 4 (four) hours as needed for cough.  Marland Kitchen HYDROcodone-acetaminophen (NORCO/VICODIN) 5-325 MG tablet Take 1 tablet by mouth every 4 (four) hours  as needed for moderate pain.  Marland Kitchen ipratropium-albuterol (DUONEB) 0.5-2.5 (3) MG/3ML SOLN Take 3 mLs by nebulization every 4 (four) hours as needed.  . magnesium oxide (MAG-OX) 400 (241.3 Mg) MG tablet Take 1 tablet (400 mg total) by mouth daily.  . metoCLOPramide (REGLAN) 5 MG tablet Take 5 mg by mouth 2 (two) times daily. Take before breakfast and supper.  . Multiple Vitamin (MULTIVITAMIN) tablet Take 1 tablet by mouth every other day.   . mupirocin ointment (BACTROBAN) 2 % Apply 1 application topically daily as needed (for sore on leg.).   Marland Kitchen nebivolol (BYSTOLIC) 5 MG tablet Take 1 tablet (5 mg total) by mouth daily.  . nitroGLYCERIN (NITROSTAT) 0.4 MG SL tablet PLACE 1 TABLET (0.4 MG TOTAL) UNDER THE TONGUE EVERY 5 (FIVE) MINUTES AS NEEDED FOR CHEST PAIN.  Marland Kitchen potassium chloride (K-DUR,KLOR-CON) 10 MEQ tablet Take 1 tablet (10 mEq total) by mouth daily.  . promethazine (PHENERGAN) 25 MG tablet Take 25 mg by mouth every 8 (eight) hours as needed. For nausea and vomiting.  Marland Kitchen SPIRIVA RESPIMAT 1.25 MCG/ACT AERS Inhale 1 puff into the lungs daily.  Marland Kitchen spironolactone (ALDACTONE) 50 MG tablet Take 25 mg by mouth every Monday, Wednesday, and Friday. Takes 1 tablet Mon. Wednesday and Friday.  . [DISCONTINUED] feeding supplement, ENSURE ENLIVE, (ENSURE ENLIVE) LIQD Take 237 mLs by mouth 2 (two) times daily between meals. (Patient not taking: Reported on 12/01/2015)   Facility-Administered Encounter Medications as of 12/01/2015  Medication  . 0.9 %  sodium chloride infusion  . alteplase (CATHFLO ACTIVASE) injection 2 mg  . ferumoxytol (FERAHEME) 510 mg in sodium chloride 0.9 % 100 mL IVPB  . heparin lock flush 100 unit/mL  . heparin lock flush 100 unit/mL  . sodium chloride 0.9 % injection 10 mL  . sodium chloride 0.9 % injection 3 mL   Social hx Social History   Social History  . Marital Status: Divorced    Spouse Name: N/A  . Number of Children: 2  . Years of Education: N/A   Occupational History   . Retired     former IT sales professional   Social History Main Topics  . Smoking status: Former Smoker -- 0.50 packs/day for 30 years    Types: Cigarettes    Quit date: 10/22/1994  . Smokeless tobacco: Never Used  . Alcohol Use: Yes  . Drug Use: No  . Sexual Activity: Not on file   Other Topics Concern  . Not on file   Social History Narrative    Review of Systems  Respiratory: Positive for shortness of breath.   Cardiovascular: Negative.   Gastrointestinal: Negative.   Musculoskeletal: Positive for arthralgias and gait problem.  Neurological: Positive for weakness.       Tearful  Hematological: Negative.  Psychiatric/Behavioral: The patient is nervous/anxious.   All other systems reviewed and are negative.   BP 135/60 mmHg  Pulse 82  Ht 5' (1.524 m)  Wt 152 lb (68.947 kg)  BMI 29.69 kg/m2  Physical Exam  Constitutional: She is oriented to person, place, and time. She appears well-developed and well-nourished.   Presenting in wheelchair  HENT:  Head: Normocephalic.  Nose: Nose normal.  Mouth/Throat: Oropharynx is clear and moist.  Eyes: Conjunctivae are normal. Pupils are equal, round, and reactive to light.  Neck: Normal range of motion. Neck supple. No JVD present.  Cardiovascular: Normal rate, regular rhythm, S1 normal, S2 normal, normal heart sounds and intact distal pulses.  Exam reveals no gallop and no friction rub.   No murmur heard. Pulmonary/Chest: Effort normal and breath sounds normal. No respiratory distress. She has no wheezes. She has no rales. She exhibits no tenderness.  Abdominal: Soft. Bowel sounds are normal. She exhibits no distension. There is no tenderness.  Musculoskeletal: Normal range of motion. She exhibits no edema or tenderness.  Lymphadenopathy:    She has no cervical adenopathy.  Neurological: She is alert and oriented to person, place, and time. Coordination normal.  Skin: Skin is warm and dry. No rash noted. No erythema.   Psychiatric: She has a normal mood and affect. Her behavior is normal. Judgment and thought content normal.    Assessment and Plan  Nursing note and vitals reviewed.

## 2015-12-01 NOTE — Assessment & Plan Note (Signed)
Hospital records reviewed in detail, Appears to be very weak, deconditioned but slowly improving   Total encounter time more than 25 minutes  Greater than 50% was spent in counseling and coordination of care with the patient

## 2015-12-01 NOTE — Patient Instructions (Addendum)
You are doing well. No medication changes were made.   We will draw labs today, BMP  Please call us if you have new issues that need to be addressed before your next appt.  Your physician wants you to follow-up in: 1 month.   Date & time:____________________________________

## 2015-12-01 NOTE — Assessment & Plan Note (Signed)
We'll check a basic metabolic panel today Continue Bumex twice a day with Aldactone Patient seems concerned she is not getting her medications in a timely manner

## 2015-12-01 NOTE — Assessment & Plan Note (Signed)
Blood pressure is well controlled on today's visit. No changes made to the medications. 

## 2015-12-01 NOTE — Assessment & Plan Note (Signed)
Recent respiratory distress requiring hospitalization in the intensive care unit, respiratory support Still with rails on the left, cough, followed by pulmonary

## 2015-12-02 ENCOUNTER — Telehealth: Payer: Self-pay

## 2015-12-02 LAB — BASIC METABOLIC PANEL
BUN / CREAT RATIO: 18 (ref 11–26)
BUN: 11 mg/dL (ref 8–27)
CHLORIDE: 77 mmol/L — AB (ref 96–106)
CO2: 39 mmol/L — ABNORMAL HIGH (ref 18–29)
Calcium: 8.9 mg/dL (ref 8.7–10.3)
Creatinine, Ser: 0.62 mg/dL (ref 0.57–1.00)
GFR calc non Af Amer: 82 mL/min/{1.73_m2} (ref >59)
GFR, EST AFRICAN AMERICAN: 95 mL/min/{1.73_m2} (ref >59)
GLUCOSE: 210 mg/dL — AB (ref 65–99)
POTASSIUM: 4.7 mmol/L (ref 3.5–5.2)
SODIUM: 129 mmol/L — AB (ref 134–144)

## 2015-12-02 NOTE — Telephone Encounter (Signed)
Attempted to contact [facility], but it is after business hours and did not get an answer x 3 attempts. Routed pt's lab results and up to date med list to [phone].

## 2015-12-02 NOTE — Telephone Encounter (Signed)
Pt daughter called, states the Nursing administrator at [employer], Sabino Niemann,  would like a call to inform what medication pt should be taking. [phone]. Also, asks if pt should go ahead with her iron infusion. Please advise

## 2015-12-05 ENCOUNTER — Other Ambulatory Visit: Payer: Self-pay | Admitting: Internal Medicine

## 2015-12-05 ENCOUNTER — Ambulatory Visit: Payer: Medicare Other

## 2015-12-05 ENCOUNTER — Inpatient Hospital Stay: Payer: Medicare Other

## 2015-12-05 ENCOUNTER — Other Ambulatory Visit: Payer: Self-pay | Admitting: Physician Assistant

## 2015-12-05 ENCOUNTER — Inpatient Hospital Stay: Payer: Medicare Other | Attending: Hematology and Oncology

## 2015-12-05 ENCOUNTER — Ambulatory Visit
Admission: RE | Admit: 2015-12-05 | Discharge: 2015-12-05 | Disposition: A | Discharge status: Home / Self Care | Payer: No Typology Code available for payment source | Source: Ambulatory Visit | Attending: Internal Medicine | Admitting: Internal Medicine

## 2015-12-05 DIAGNOSIS — T17908A Unspecified foreign body in respiratory tract, part unspecified causing other injury, initial encounter: Secondary | ICD-10-CM

## 2015-12-05 DIAGNOSIS — D649 Anemia, unspecified: Secondary | ICD-10-CM

## 2015-12-05 DIAGNOSIS — J15211 Pneumonia due to Methicillin susceptible Staphylococcus aureus: Secondary | ICD-10-CM

## 2015-12-05 DIAGNOSIS — Z9189 Other specified personal risk factors, not elsewhere classified: Secondary | ICD-10-CM

## 2015-12-05 DIAGNOSIS — D509 Iron deficiency anemia, unspecified: Secondary | ICD-10-CM | POA: Insufficient documentation

## 2015-12-05 DIAGNOSIS — D508 Other iron deficiency anemias: Secondary | ICD-10-CM

## 2015-12-05 LAB — IRON AND TIBC
IRON: 22 ug/dL — AB (ref 28–170)
Saturation Ratios: 6 % — ABNORMAL LOW (ref 10.4–31.8)
TIBC: 355 ug/dL (ref 250–450)
UIBC: 333 ug/dL

## 2015-12-05 LAB — CBC WITH DIFFERENTIAL/PLATELET
Basophils Absolute: 0 10*3/uL (ref 0–0.1)
Basophils Relative: 1 %
Eosinophils Absolute: 0.1 10*3/uL (ref 0–0.7)
Eosinophils Relative: 1 %
HCT: 33.3 % — ABNORMAL LOW (ref 35.0–47.0)
Hemoglobin: 10.9 g/dL — ABNORMAL LOW (ref 12.0–16.0)
Lymphocytes Relative: 13 %
Lymphs Abs: 1 10*3/uL (ref 1.0–3.6)
MCH: 26.9 pg (ref 26.0–34.0)
MCHC: 32.9 g/dL (ref 32.0–36.0)
MCV: 81.9 fL (ref 80.0–100.0)
Monocytes Absolute: 0.4 10*3/uL (ref 0.2–0.9)
Monocytes Relative: 6 %
Neutro Abs: 6.1 10*3/uL (ref 1.4–6.5)
Neutrophils Relative %: 79 %
Platelets: 362 10*3/uL (ref 150–440)
RBC: 4.06 MIL/uL (ref 3.80–5.20)
RDW: 17.4 % — ABNORMAL HIGH (ref 11.5–14.5)
WBC: 7.7 10*3/uL (ref 3.6–11.0)

## 2015-12-05 LAB — FERRITIN: Ferritin: 40 ng/mL (ref 11–307)

## 2015-12-05 NOTE — Progress Notes (Signed)
Iron and Ferritin level results not back yet. Patient notified that it could take another 45 minutes to one hour. Patient given the option to leave and come back for Feraheme infusion another day this week if needed once lab results have been reviewed. Sharman Cheek, RN, notified. Patient educated that Dr. Danton Sewer nurse will call with lab results tomorrow and notify the patient if she needs to make an appointment to come back for Feraheme infusion this week.

## 2015-12-07 ENCOUNTER — Other Ambulatory Visit: Payer: Self-pay | Admitting: Cardiovascular Disease

## 2015-12-07 ENCOUNTER — Encounter: Payer: Self-pay | Admitting: General Surgery

## 2015-12-13 ENCOUNTER — Ambulatory Visit: Payer: Self-pay | Admitting: General Surgery

## 2015-12-26 ENCOUNTER — Other Ambulatory Visit: Payer: Self-pay | Admitting: Cardiovascular Disease

## 2015-12-27 ENCOUNTER — Ambulatory Visit
Admission: RE | Admit: 2015-12-27 | Discharge: 2015-12-27 | Disposition: A | Discharge status: Home / Self Care | Payer: Medicare Other | Source: Ambulatory Visit | Attending: Physician Assistant | Admitting: Physician Assistant

## 2015-12-27 ENCOUNTER — Ambulatory Visit: Payer: Medicare Other

## 2015-12-27 ENCOUNTER — Other Ambulatory Visit: Payer: Self-pay | Admitting: Physician Assistant

## 2015-12-27 DIAGNOSIS — R059 Cough, unspecified: Secondary | ICD-10-CM

## 2015-12-27 DIAGNOSIS — R05 Cough: Secondary | ICD-10-CM

## 2015-12-27 DIAGNOSIS — R918 Other nonspecific abnormal finding of lung field: Secondary | ICD-10-CM | POA: Insufficient documentation

## 2016-01-03 ENCOUNTER — Telehealth: Payer: Self-pay | Admitting: Cardiovascular Disease

## 2016-01-03 NOTE — Telephone Encounter (Signed)
LM[facility] samples available of Bystolic 5 mg with LOT # [id number] Exp. 05/2017.

## 2016-01-03 NOTE — Telephone Encounter (Signed)
Placed Bystolic 5 mg samples at front desk for pick up.

## 2016-01-03 NOTE — Telephone Encounter (Signed)
Patient calling the office for samples of medication:   1.  What medication and dosage are you requesting samples for? Bystolic   2.  Are you currently out of this medication? Has a few

## 2016-01-10 ENCOUNTER — Encounter: Payer: Self-pay | Admitting: Emergency Medicine

## 2016-01-10 ENCOUNTER — Ambulatory Visit: Payer: Medicare Other

## 2016-01-10 ENCOUNTER — Emergency Department: Payer: Medicare Other

## 2016-01-10 ENCOUNTER — Inpatient Hospital Stay
Admission: EM | Admit: 2016-01-10 | Discharge: 2016-01-19 | DRG: 189 | Disposition: A | Discharge status: Rehabilitation Facility | Payer: Medicare Other | Attending: Internal Medicine | Admitting: Internal Medicine

## 2016-01-10 DIAGNOSIS — I739 Peripheral vascular disease, unspecified: Secondary | ICD-10-CM | POA: Diagnosis present

## 2016-01-10 DIAGNOSIS — K219 Gastro-esophageal reflux disease without esophagitis: Secondary | ICD-10-CM | POA: Diagnosis present

## 2016-01-10 DIAGNOSIS — Z9981 Dependence on supplemental oxygen: Secondary | ICD-10-CM | POA: Diagnosis not present

## 2016-01-10 DIAGNOSIS — M810 Age-related osteoporosis without current pathological fracture: Secondary | ICD-10-CM | POA: Diagnosis present

## 2016-01-10 DIAGNOSIS — Z87891 Personal history of nicotine dependence: Secondary | ICD-10-CM

## 2016-01-10 DIAGNOSIS — Z79899 Other long term (current) drug therapy: Secondary | ICD-10-CM | POA: Diagnosis not present

## 2016-01-10 DIAGNOSIS — J9811 Atelectasis: Secondary | ICD-10-CM | POA: Diagnosis present

## 2016-01-10 DIAGNOSIS — Z888 Allergy status to other drugs, medicaments and biological substances status: Secondary | ICD-10-CM | POA: Diagnosis not present

## 2016-01-10 DIAGNOSIS — I509 Heart failure, unspecified: Secondary | ICD-10-CM

## 2016-01-10 DIAGNOSIS — E785 Hyperlipidemia, unspecified: Secondary | ICD-10-CM | POA: Diagnosis present

## 2016-01-10 DIAGNOSIS — Z8249 Family history of ischemic heart disease and other diseases of the circulatory system: Secondary | ICD-10-CM | POA: Diagnosis not present

## 2016-01-10 DIAGNOSIS — I480 Paroxysmal atrial fibrillation: Secondary | ICD-10-CM | POA: Diagnosis present

## 2016-01-10 DIAGNOSIS — J209 Acute bronchitis, unspecified: Secondary | ICD-10-CM | POA: Diagnosis present

## 2016-01-10 DIAGNOSIS — K449 Diaphragmatic hernia without obstruction or gangrene: Secondary | ICD-10-CM | POA: Diagnosis present

## 2016-01-10 DIAGNOSIS — R042 Hemoptysis: Secondary | ICD-10-CM | POA: Diagnosis present

## 2016-01-10 DIAGNOSIS — D649 Anemia, unspecified: Secondary | ICD-10-CM | POA: Insufficient documentation

## 2016-01-10 DIAGNOSIS — I5033 Acute on chronic diastolic (congestive) heart failure: Secondary | ICD-10-CM | POA: Diagnosis present

## 2016-01-10 DIAGNOSIS — J9601 Acute respiratory failure with hypoxia: Principal | ICD-10-CM | POA: Diagnosis present

## 2016-01-10 DIAGNOSIS — I701 Atherosclerosis of renal artery: Secondary | ICD-10-CM | POA: Diagnosis present

## 2016-01-10 DIAGNOSIS — Z7951 Long term (current) use of inhaled steroids: Secondary | ICD-10-CM

## 2016-01-10 DIAGNOSIS — E871 Hypo-osmolality and hyponatremia: Secondary | ICD-10-CM | POA: Diagnosis present

## 2016-01-10 DIAGNOSIS — R0600 Dyspnea, unspecified: Secondary | ICD-10-CM | POA: Insufficient documentation

## 2016-01-10 DIAGNOSIS — I959 Hypotension, unspecified: Secondary | ICD-10-CM | POA: Diagnosis present

## 2016-01-10 DIAGNOSIS — Z7982 Long term (current) use of aspirin: Secondary | ICD-10-CM | POA: Diagnosis not present

## 2016-01-10 DIAGNOSIS — J441 Chronic obstructive pulmonary disease with (acute) exacerbation: Secondary | ICD-10-CM | POA: Diagnosis present

## 2016-01-10 DIAGNOSIS — J9600 Acute respiratory failure, unspecified whether with hypoxia or hypercapnia: Secondary | ICD-10-CM | POA: Diagnosis present

## 2016-01-10 DIAGNOSIS — D62 Acute posthemorrhagic anemia: Secondary | ICD-10-CM | POA: Diagnosis present

## 2016-01-10 DIAGNOSIS — K922 Gastrointestinal hemorrhage, unspecified: Secondary | ICD-10-CM

## 2016-01-10 DIAGNOSIS — I272 Other secondary pulmonary hypertension: Secondary | ICD-10-CM | POA: Diagnosis present

## 2016-01-10 DIAGNOSIS — Z880 Allergy status to penicillin: Secondary | ICD-10-CM | POA: Diagnosis not present

## 2016-01-10 DIAGNOSIS — Z9181 History of falling: Secondary | ICD-10-CM

## 2016-01-10 DIAGNOSIS — I471 Supraventricular tachycardia: Secondary | ICD-10-CM | POA: Diagnosis present

## 2016-01-10 DIAGNOSIS — E119 Type 2 diabetes mellitus without complications: Secondary | ICD-10-CM | POA: Diagnosis present

## 2016-01-10 DIAGNOSIS — J440 Chronic obstructive pulmonary disease with acute lower respiratory infection: Secondary | ICD-10-CM | POA: Diagnosis present

## 2016-01-10 DIAGNOSIS — Z8673 Personal history of transient ischemic attack (TIA), and cerebral infarction without residual deficits: Secondary | ICD-10-CM

## 2016-01-10 DIAGNOSIS — R0602 Shortness of breath: Secondary | ICD-10-CM | POA: Diagnosis not present

## 2016-01-10 DIAGNOSIS — Z882 Allergy status to sulfonamides status: Secondary | ICD-10-CM

## 2016-01-10 DIAGNOSIS — I341 Nonrheumatic mitral (valve) prolapse: Secondary | ICD-10-CM | POA: Diagnosis present

## 2016-01-10 DIAGNOSIS — J9622 Acute and chronic respiratory failure with hypercapnia: Secondary | ICD-10-CM | POA: Diagnosis present

## 2016-01-10 DIAGNOSIS — I472 Ventricular tachycardia: Secondary | ICD-10-CM | POA: Diagnosis present

## 2016-01-10 DIAGNOSIS — Z881 Allergy status to other antibiotic agents status: Secondary | ICD-10-CM

## 2016-01-10 DIAGNOSIS — J9602 Acute respiratory failure with hypercapnia: Secondary | ICD-10-CM | POA: Diagnosis not present

## 2016-01-10 DIAGNOSIS — I110 Hypertensive heart disease with heart failure: Secondary | ICD-10-CM | POA: Diagnosis present

## 2016-01-10 DIAGNOSIS — G9341 Metabolic encephalopathy: Secondary | ICD-10-CM | POA: Diagnosis present

## 2016-01-10 DIAGNOSIS — F320 Major depressive disorder, single episode, mild: Secondary | ICD-10-CM

## 2016-01-10 DIAGNOSIS — J9690 Respiratory failure, unspecified, unspecified whether with hypoxia or hypercapnia: Secondary | ICD-10-CM | POA: Diagnosis present

## 2016-01-10 LAB — ABO/RH: ABO/RH(D): O POS

## 2016-01-10 LAB — INFLUENZA PANEL BY PCR (TYPE A & B)
H1N1FLUPCR: NOT DETECTED
Influenza A By PCR: NEGATIVE
Influenza B By PCR: NEGATIVE

## 2016-01-10 LAB — BRAIN NATRIURETIC PEPTIDE: B Natriuretic Peptide: 384 pg/mL — ABNORMAL HIGH (ref 0.0–100.0)

## 2016-01-10 LAB — COMPREHENSIVE METABOLIC PANEL
ALT: 21 U/L (ref 14–54)
AST: 19 U/L (ref 15–41)
Albumin: 3 g/dL — ABNORMAL LOW (ref 3.5–5.0)
Alkaline Phosphatase: 67 U/L (ref 38–126)
Anion gap: 6 (ref 5–15)
BILIRUBIN TOTAL: 0.4 mg/dL (ref 0.3–1.2)
BUN: 21 mg/dL — AB (ref 6–20)
CALCIUM: 8.2 mg/dL — AB (ref 8.9–10.3)
CO2: 32 mmol/L (ref 22–32)
CREATININE: 0.67 mg/dL (ref 0.44–1.00)
Chloride: 95 mmol/L — ABNORMAL LOW (ref 101–111)
GFR calc Af Amer: >60 mL/min (ref >60)
Glucose, Bld: 150 mg/dL — ABNORMAL HIGH (ref 65–99)
Potassium: 3.7 mmol/L (ref 3.5–5.1)
Sodium: 133 mmol/L — ABNORMAL LOW (ref 135–145)
TOTAL PROTEIN: 5.4 g/dL — AB (ref 6.5–8.1)

## 2016-01-10 LAB — CBC
HEMATOCRIT: 23.7 % — AB (ref 35.0–47.0)
Hemoglobin: 7.4 g/dL — ABNORMAL LOW (ref 12.0–16.0)
MCH: 24.9 pg — AB (ref 26.0–34.0)
MCHC: 31.4 g/dL — ABNORMAL LOW (ref 32.0–36.0)
MCV: 79.4 fL — AB (ref 80.0–100.0)
Platelets: 315 10*3/uL (ref 150–440)
RBC: 2.98 MIL/uL — AB (ref 3.80–5.20)
RDW: 19.9 % — ABNORMAL HIGH (ref 11.5–14.5)
WBC: 7.1 10*3/uL (ref 3.6–11.0)

## 2016-01-10 LAB — TROPONIN I

## 2016-01-10 LAB — PREPARE RBC (CROSSMATCH)

## 2016-01-10 LAB — MRSA PCR SCREENING: MRSA BY PCR: NEGATIVE

## 2016-01-10 LAB — GLUCOSE, CAPILLARY: Glucose-Capillary: 191 mg/dL — ABNORMAL HIGH (ref 65–99)

## 2016-01-10 MED ORDER — CITALOPRAM HYDROBROMIDE 20 MG PO TABS
20.0000 mg | ORAL_TABLET | Freq: Every day | ORAL | Status: DC
  Administered 2016-01-11 – 2016-01-13 (×3): 20 mg via ORAL
  Filled 2016-01-10 (×3): qty 1

## 2016-01-10 MED ORDER — TIOTROPIUM BROMIDE MONOHYDRATE 18 MCG IN CAPS
18.0000 ug | ORAL_CAPSULE | Freq: Every day | RESPIRATORY_TRACT | Status: DC
  Administered 2016-01-12 – 2016-01-19 (×6): 18 ug via RESPIRATORY_TRACT
  Filled 2016-01-10 (×2): qty 5

## 2016-01-10 MED ORDER — TIOTROPIUM BROMIDE MONOHYDRATE 1.25 MCG/ACT IN AERS: 1.0000 | INHALATION_SPRAY | Freq: Every day | RESPIRATORY_TRACT | Status: DC

## 2016-01-10 MED ORDER — SPIRONOLACTONE 25 MG PO TABS
25.0000 mg | ORAL_TABLET | ORAL | Status: DC
  Administered 2016-01-11 – 2016-01-18 (×4): 25 mg via ORAL
  Filled 2016-01-10 (×4): qty 1

## 2016-01-10 MED ORDER — ALPRAZOLAM 0.25 MG PO TABS: 0.2500 mg | ORAL_TABLET | Freq: Three times a day (TID) | ORAL | Status: DC | PRN

## 2016-01-10 MED ORDER — ENOXAPARIN SODIUM 40 MG/0.4ML ~~LOC~~ SOLN: 40.0000 mg | SUBCUTANEOUS | Status: DC

## 2016-01-10 MED ORDER — FUROSEMIDE 10 MG/ML IJ SOLN: 40.0000 mg | Freq: Once | INTRAMUSCULAR | Status: DC

## 2016-01-10 MED ORDER — POLYVINYL ALCOHOL 1.4 % OP SOLN
1.0000 [drp] | Freq: Two times a day (BID) | OPHTHALMIC | Status: DC
  Administered 2016-01-10 – 2016-01-19 (×17): 1 [drp] via OPHTHALMIC
  Filled 2016-01-10 (×2): qty 15

## 2016-01-10 MED ORDER — HYDROCODONE-ACETAMINOPHEN 5-325 MG PO TABS
1.0000 | ORAL_TABLET | ORAL | Status: DC | PRN
  Administered 2016-01-12 – 2016-01-16 (×2): 1 via ORAL
  Filled 2016-01-10 (×2): qty 1

## 2016-01-10 MED ORDER — NITROGLYCERIN 0.4 MG SL SUBL
0.4000 mg | SUBLINGUAL_TABLET | SUBLINGUAL | Status: DC | PRN
  Administered 2016-01-12 (×3): 0.4 mg via SUBLINGUAL
  Filled 2016-01-10 (×3): qty 1

## 2016-01-10 MED ORDER — DOXAZOSIN MESYLATE 4 MG PO TABS
4.0000 mg | ORAL_TABLET | Freq: Every day | ORAL | Status: DC
  Administered 2016-01-10 – 2016-01-18 (×9): 4 mg via ORAL
  Filled 2016-01-10 (×9): qty 1

## 2016-01-10 MED ORDER — ASPIRIN EC 81 MG PO TBEC
81.0000 mg | DELAYED_RELEASE_TABLET | Freq: Every day | ORAL | Status: DC
  Administered 2016-01-11 – 2016-01-19 (×9): 81 mg via ORAL
  Filled 2016-01-10 (×9): qty 1

## 2016-01-10 MED ORDER — DIPHENHYDRAMINE-APAP (SLEEP) 25-500 MG PO TABS: 1.0000 | ORAL_TABLET | Freq: Every evening | ORAL | Status: DC | PRN

## 2016-01-10 MED ORDER — PANTOPRAZOLE SODIUM 40 MG IV SOLR
40.0000 mg | Freq: Two times a day (BID) | INTRAVENOUS | Status: DC
  Administered 2016-01-10 – 2016-01-19 (×18): 40 mg via INTRAVENOUS
  Filled 2016-01-10 (×18): qty 40

## 2016-01-10 MED ORDER — POLYETHYL GLYCOL-PROPYL GLYCOL 0.4-0.3 % OP GEL: Freq: Two times a day (BID) | OPHTHALMIC | Status: DC

## 2016-01-10 MED ORDER — FLUTICASONE PROPIONATE 50 MCG/ACT NA SUSP
1.0000 | Freq: Two times a day (BID) | NASAL | Status: DC
  Administered 2016-01-11 – 2016-01-19 (×14): 1 via NASAL
  Filled 2016-01-10 (×2): qty 16

## 2016-01-10 MED ORDER — CHLORHEXIDINE GLUCONATE 0.12 % MT SOLN
15.0000 mL | Freq: Two times a day (BID) | OROMUCOSAL | Status: DC
  Administered 2016-01-10 – 2016-01-19 (×15): 15 mL via OROMUCOSAL
  Filled 2016-01-10 (×13): qty 15

## 2016-01-10 MED ORDER — SODIUM CHLORIDE 0.9% FLUSH
3.0000 mL | Freq: Two times a day (BID) | INTRAVENOUS | Status: DC
  Administered 2016-01-10 – 2016-01-19 (×18): 3 mL via INTRAVENOUS

## 2016-01-10 MED ORDER — GABAPENTIN 100 MG PO CAPS
100.0000 mg | ORAL_CAPSULE | Freq: Two times a day (BID) | ORAL | Status: DC
  Administered 2016-01-10 – 2016-01-19 (×18): 100 mg via ORAL
  Filled 2016-01-10 (×18): qty 1

## 2016-01-10 MED ORDER — NEBIVOLOL HCL 5 MG PO TABS
5.0000 mg | ORAL_TABLET | Freq: Every day | ORAL | Status: DC
Start: 2016-01-10 — End: 2016-01-11
  Filled 2016-01-10: qty 1

## 2016-01-10 MED ORDER — METHYLPREDNISOLONE SODIUM SUCC 125 MG IJ SOLR
60.0000 mg | Freq: Three times a day (TID) | INTRAMUSCULAR | Status: DC
  Administered 2016-01-11 – 2016-01-12 (×5): 60 mg via INTRAVENOUS
  Filled 2016-01-10 (×5): qty 2

## 2016-01-10 MED ORDER — MUPIROCIN 2 % EX OINT: 1.0000 "application " | TOPICAL_OINTMENT | Freq: Every day | CUTANEOUS | Status: DC | PRN

## 2016-01-10 MED ORDER — BUDESONIDE 0.25 MG/2ML IN SUSP
0.2500 mg | Freq: Two times a day (BID) | RESPIRATORY_TRACT | Status: DC
  Administered 2016-01-11 – 2016-01-19 (×16): 0.25 mg via RESPIRATORY_TRACT
  Filled 2016-01-10 (×16): qty 2

## 2016-01-10 MED ORDER — DIPHENHYDRAMINE HCL 25 MG PO CAPS
25.0000 mg | ORAL_CAPSULE | Freq: Every evening | ORAL | Status: DC | PRN
  Administered 2016-01-12: 25 mg via ORAL
  Filled 2016-01-10: qty 1

## 2016-01-10 MED ORDER — CETYLPYRIDINIUM CHLORIDE 0.05 % MT LIQD
7.0000 mL | Freq: Two times a day (BID) | OROMUCOSAL | Status: DC
  Administered 2016-01-11 – 2016-01-19 (×10): 7 mL via OROMUCOSAL

## 2016-01-10 MED ORDER — METHYLPREDNISOLONE SODIUM SUCC 125 MG IJ SOLR
125.0000 mg | Freq: Once | INTRAMUSCULAR | Status: AC
  Administered 2016-01-10: 125 mg via INTRAVENOUS
  Filled 2016-01-10: qty 2

## 2016-01-10 MED ORDER — PROMETHAZINE HCL 25 MG PO TABS
25.0000 mg | ORAL_TABLET | Freq: Three times a day (TID) | ORAL | Status: DC | PRN
  Administered 2016-01-15 – 2016-01-19 (×6): 25 mg via ORAL
  Filled 2016-01-10 (×8): qty 1

## 2016-01-10 MED ORDER — CHLORHEXIDINE GLUCONATE 0.12 % MT SOLN
15.0000 mL | Freq: Every day | OROMUCOSAL | Status: DC
  Administered 2016-01-12 – 2016-01-18 (×6): 15 mL via OROMUCOSAL
  Filled 2016-01-10 (×8): qty 15

## 2016-01-10 MED ORDER — SODIUM CHLORIDE 0.9 % IV SOLN: 10.0000 mL/h | Freq: Once | INTRAVENOUS | Status: DC

## 2016-01-10 MED ORDER — ALBUTEROL SULFATE (2.5 MG/3ML) 0.083% IN NEBU
2.5000 mg | INHALATION_SOLUTION | Freq: Four times a day (QID) | RESPIRATORY_TRACT | Status: DC | PRN
  Administered 2016-01-11 – 2016-01-18 (×11): 2.5 mg via RESPIRATORY_TRACT
  Filled 2016-01-10 (×11): qty 3

## 2016-01-10 MED ORDER — BUMETANIDE 0.25 MG/ML IJ SOLN
1.0000 mg | Freq: Once | INTRAMUSCULAR | Status: AC
  Administered 2016-01-10: 1 mg via INTRAVENOUS
  Filled 2016-01-10: qty 4

## 2016-01-10 MED ORDER — BUMETANIDE 0.25 MG/ML IJ SOLN
1.0000 mg | Freq: Once | INTRAMUSCULAR | Status: DC
  Filled 2016-01-10 (×2): qty 4

## 2016-01-10 MED ORDER — EZETIMIBE 10 MG PO TABS
10.0000 mg | ORAL_TABLET | Freq: Every day | ORAL | Status: DC
  Administered 2016-01-10 – 2016-01-18 (×9): 10 mg via ORAL
  Filled 2016-01-10 (×9): qty 1

## 2016-01-10 NOTE — ED Notes (Signed)
Pt via ems after daughter brought her to EMS station barely breathing ("blue" per ems). Pt given 2 duonebs before asking for mask. They placed her on CPAP. Pt was not responsive upon arrival, but has since awakened and begun to speak, asking to "go to sleep."

## 2016-01-10 NOTE — H&P (Signed)
[facility] - [place] at [facility]   PATIENT NAME: Catherine Holmes    MR#:  [id number]  DATE OF BIRTH:  [birth year]  DATE OF ADMISSION:  01/10/2016  PRIMARY CARE PHYSICIAN: Marisue Ivan, MD   REQUESTING/REFERRING PHYSICIAN: Minna Antis  CHIEF COMPLAINT:   Chief Complaint  Patient presents with  . Respiratory Distress    HISTORY OF PRESENT ILLNESS:  Catherine Holmes  is a 82 y.o. female with a known history of COPD and CHF presents with shortness of breath. She states that she can't breathe. Currently she is on BiPAP in order to oxygenate. She states that she's been on oxygen for a few minutes.  PAST MEDICAL HISTORY:   Past Medical History  Diagnosis Date  . Essential hypertension   . Diabetes mellitus type II, controlled (HCC)   . COPD (chronic obstructive pulmonary disease) (HCC)   . Osteoporosis   . Carotid arterial disease (HCC)     a. 2012 Carotid dopplers: 40-59% R ICA, 60-79% L ICA  . Tic douloureux   . Arthralgia   . Sjoegren syndrome (HCC)   . TIA (transient ischemic attack)   . Emphysema   . MVP (mitral valve prolapse)   . Chronic diastolic CHF (congestive heart failure) (HCC)     a. 08/2011 Echo: EF 65-70%, mild LVH, mod dil LA, mildly dil RA, mild MR, mild-mod AoV Sclerosis w/o stenosis, mod-sev elev PA pressures, mild TR.  Marland Kitchen Acid reflux   . Diverticulitis   . Anemia   . History of pneumonia   . Syncope and collapse   . Anemia   . Hip fracture, left (HCC)   . Hyperlipidemia   . Hiatal hernia   . Chest pain     a. 08/2011 MV: EF 74%, no ischemia.  . Noncompliance     a. h/o not taking bumex as Rx.  Marland Kitchen PAF (paroxysmal atrial fibrillation) (HCC)     a. CHA2DS2VASc = 8 ->No OAC 2/2 falls.    PAST SURGICAL HISTORY:   Past Surgical History  Procedure Laterality Date  . Cholecystectomy  1965  . Abdominal hysterectomy  1964  . Cataract extraction Bilateral   . Hip fracture surgery  2014    left hip    SOCIAL  HISTORY:   Social History  Substance Use Topics  . Smoking status: Former Smoker -- 0.50 packs/day for 30 years    Types: Cigarettes    Quit date: 10/22/1994  . Smokeless tobacco: Never Used  . Alcohol Use: Yes    FAMILY HISTORY:   Family History  Problem Relation Age of Onset  . Heart attack Mother   . Heart attack Father   . Heart attack Sister     DRUG ALLERGIES:   Allergies  Allergen Reactions  . Singlet [Chlorphen-Pe-Acetaminophen] Shortness Of Breath  . Atorvastatin Other (See Comments)    Reaction:  Unknown   . Clindamycin Diarrhea and Nausea And Vomiting  . Codeine Other (See Comments)    Reaction:  Unknown   . Doxycycline Hives and Itching  . Epinephrine Hives and Itching  . Erythromycin Hives and Itching  . Fluticasone-Salmeterol Other (See Comments)    Reaction:  Unknown   . Lasix [Furosemide] Itching  . Levofloxacin Other (See Comments)    Reaction:  Unknown   . Penicillins Other (See Comments)    Reaction:  Unknown   . Propoxyphene Hcl Other (See Comments)    Reaction:  Unknown   . Rosuvastatin Other (See Comments)  Reaction:  Unknown   . Simvastatin Other (See Comments)    Reaction:  Unknown   . Sulfamethoxazole-Trimethoprim Other (See Comments)    Reaction:  Unknown   . Teriparatide Other (See Comments)    Reaction:  Unknown   . Tetracyclines & Related Hives and Itching  . Ceclor [Cefaclor] Hives and Other (See Comments)    Reaction:  Altered mental status    . Glipizide Hives and Itching  . Lisinopril Hives and Itching    REVIEW OF SYSTEMS:  CONSTITUTIONAL: No fever, Positive for fatigue. Positive for chills and sweats. Positive for weight up and down EYES: Sometimes some blurred vision and sees black spots. EARS, NOSE, AND THROAT: No tinnitus or ear pain. No sore throat. Occasional dysphasia. Positive for postnasal drip RESPIRATORY: Positive for cough, positive for shortness of breath, some wheezing or no hemoptysis.  CARDIOVASCULAR:  Positive for chest pain, positive for edema.  GASTROINTESTINAL: Positive for nausea, vomiting, and abdominal pain. No blood in bowel movements GENITOURINARY: No dysuria, hematuria.  ENDOCRINE: No polyuria, nocturia,  HEMATOLOGY: No anemia, easy bruising or bleeding SKIN: No rash or lesion. MUSCULOSKELETAL: Positive for joint pain.   NEUROLOGIC: History of blackout PSYCHIATRY: No anxiety or depression.   MEDICATIONS AT HOME:   Prior to Admission medications   Medication Sig Start Date End Date Taking? Authorizing Provider  albuterol (PROVENTIL) (2.5 MG/3ML) 0.083% nebulizer solution Take 2.5 mg by nebulization every 6 (six) hours as needed for wheezing or shortness of breath.    Yes Historical Provider, MD  ALPRAZolam (XANAX) 0.25 MG tablet Take 1 tablet (0.25 mg total) by mouth 3 (three) times daily as needed for anxiety. 11/21/15  Yes Srikar Sudini, MD  aspirin EC 81 MG tablet Take 81 mg by mouth daily with lunch.    Yes Historical Provider, MD  bumetanide (BUMEX) 1 MG tablet Take 1-2 mg by mouth 2 (two) times daily as needed (for edema).   Yes Historical Provider, MD  calcium carbonate (OSCAL) 1500 (600 Ca) MG TABS tablet Take 600 mg of elemental calcium by mouth daily with breakfast.   Yes Historical Provider, MD  chlorhexidine (PERIDEX) 0.12 % solution 15 mLs by Mouth Rinse route at bedtime.    Yes Historical Provider, MD  citalopram (CELEXA) 20 MG tablet Take 20 mg by mouth daily with lunch.    Yes Historical Provider, MD  diphenhydramine-acetaminophen (TYLENOL PM) 25-500 MG TABS tablet Take 1 tablet by mouth at bedtime as needed (for sleep).   Yes Historical Provider, MD  doxazosin (CARDURA) 4 MG tablet Take 4 mg by mouth at bedtime.    Yes Antonieta Ibaimothy J Gollan, MD  ezetimibe (ZETIA) 10 MG tablet Take 10 mg by mouth at bedtime.    Yes Historical Provider, MD  fluticasone (FLONASE) 50 MCG/ACT nasal spray Place 1 spray into both nostrils 2 (two) times daily.    Yes Historical Provider, MD   gabapentin (NEURONTIN) 100 MG capsule Take 100 mg by mouth 2 (two) times daily.    Yes Historical Provider, MD  guaiFENesin-dextromethorphan (ROBITUSSIN DM) 100-10 MG/5ML syrup Take 5 mLs by mouth every 4 (four) hours as needed for cough. 11/21/15  Yes Srikar Sudini, MD  HYDROcodone-acetaminophen (NORCO/VICODIN) 5-325 MG tablet Take 1 tablet by mouth every 4 (four) hours as needed for moderate pain. 11/21/15  Yes Srikar Sudini, MD  mometasone-formoterol (DULERA) 100-5 MCG/ACT AERO Inhale 2 puffs into the lungs 2 (two) times daily.   Yes Historical Provider, MD  mupirocin ointment (BACTROBAN) 2 % Apply  1 application topically daily as needed (for sores on leg).    Yes Historical Provider, MD  nebivolol (BYSTOLIC) 5 MG tablet Take 1 tablet (5 mg total) by mouth daily. 04/03/15  Yes Adrian Saran, MD  nitroGLYCERIN (NITROSTAT) 0.4 MG SL tablet Place 0.4 mg under the tongue every 5 (five) minutes as needed for chest pain.   Yes Historical Provider, MD  Polyethyl Glycol-Propyl Glycol (SYSTANE OP) Place 1 drop into both eyes 2 (two) times daily.   Yes Historical Provider, MD  promethazine (PHENERGAN) 25 MG tablet Take 25 mg by mouth every 8 (eight) hours as needed for nausea or vomiting.    Yes Historical Provider, MD  SPIRIVA RESPIMAT 1.25 MCG/ACT AERS Inhale 1 puff into the lungs daily.   Yes Historical Provider, MD  spironolactone (ALDACTONE) 25 MG tablet Take 25 mg by mouth every Monday, Wednesday, and Friday.   Yes Historical Provider, MD      VITAL SIGNS:  Blood pressure 80/53, pulse 75, temperature 97.4 F (36.3 C), temperature source Axillary, resp. rate 19, height  (1.676 m), weight 68.04 kg (150 lb), SpO2 99 %.  PHYSICAL EXAMINATION:  GENERAL:  82 y.o.-year-old patient lying in the bed with acute distress.  EYES: Pupils equal, round, reactive to light and accommodation. No scleral icterus. Extraocular muscles intact.  HEENT: Head atraumatic, normocephalic. Oropharynx and nasopharynx clear.   NECK:  Supple, no jugular venous distention. No thyroid enlargement, no tenderness.  LUNGS: Decreased breath sounds bilaterally, positive wheezing bilateral lower lungs, positive rales at bases, no rhonchi or crepitation. Positive use of accessory muscles of respiration.  CARDIOVASCULAR: S1, S2 normal. No murmurs, rubs, or gallops.  ABDOMEN: Soft, nontender, nondistended. Bowel sounds present. No organomegaly or mass.  EXTREMITIES: 3+ pedal edema, no cyanosis, or clubbing.  NEUROLOGIC: Cranial nerves II through XII are intact. Muscle strength 5/5 in all extremities. Sensation intact. Gait not checked.  PSYCHIATRIC: The patient is alert and oriented x 3.  SKIN: Some lower extremity erythema.   LABORATORY PANEL:   CBC  Recent Labs Lab 01/10/16 1526  WBC 7.1  HGB 7.4*  HCT 23.7*  PLT 315   ------------------------------------------------------------------------------------------------------------------  Chemistries   Recent Labs Lab 01/10/16 1526  NA 133*  K 3.7  CL 95*  CO2 32  GLUCOSE 150*  BUN 21*  CREATININE 0.67  CALCIUM 8.2*  AST 19  ALT 21  ALKPHOS 67  BILITOT 0.4   ------------------------------------------------------------------------------------------------------------------  Cardiac Enzymes  Recent Labs Lab 01/10/16 1526  TROPONINI <0.03   ------------------------------------------------------------------------------------------------------------------  RADIOLOGY:  Dg Chest Portable 1 View  01/10/2016  CLINICAL DATA:  Unresponsive EXAM: PORTABLE CHEST 1 VIEW COMPARISON:  12/26/2005 FINDINGS: Stable enlarged cardiac silhouette. Large hiatal hernia again noted. Decrease in lung volumes compared to prior. Mild interstitial edema pattern noted. No focal infiltrate. No pneumothorax. No overt pulmonary edema. IMPRESSION: 1. Decrease in lung volumes compared to prior. 2. Mild interstitial edema pattern. 3. Large hiatal hernia and cardiomegaly. Electronically  Signed   By: Genevive Bi M.D.   On: 01/10/2016 15:42    EKG:   Normal sinus rhythm 72 bpm poor R-wave progression  IMPRESSION AND PLAN:   1. Acute respiratory failure with hypoxia. Patient currently on BiPAP in order to oxygenate. Patient will have to be admitted to the CCU with hospital policy with BiPAP. 2. COPD exacerbation start high-dose IV Solu-Medrol. No antibiotics with numerous allergies. Continue nebulizers, add budesonide nebulizers. Continue Spiriva. 3. Anemia with drop in hemoglobin from last month. Patient guaiac  positive as per ER physician with brown stool. Start Protonix 40 mg IV twice a day. Transfuse 1 unit of packed red blood cells. Bumex IV prior and after transfusion. 4. Chronic diastolic congestive heart failure. Bumex IV prior and after blood 5. Relative hypotension. Continue to monitor closely in the ICU 6. Impaired fasting glucose check a hemoglobin A1c put on sliding scale  All the records are reviewed and case discussed with ED provider. Management plans discussed with the patient, family and they are in agreement.  CODE STATUS: Full code  TOTAL TIME TAKING CARE OF THIS PATIENT: 55 minutes, patient will be admitted to the CCU stepdown.    Alford Highland M.D on 01/10/2016 at 6:29 PM  Between 7am to 6pm - Pager - [phone]  After 6pm call admission pager [phone]  [facility]  Office  [phone]  CC: Primary care physician; Marisue Ivan, MD

## 2016-01-10 NOTE — ED Notes (Signed)
Pt very anxious. Wanted to use restroom before starting blood transfusion but changed her mind. She indicated that "once she is sure that everything is all right," she will use a bedpan.

## 2016-01-10 NOTE — ED Provider Notes (Signed)
[facility] Emergency [facility] Provider Note  Time seen: 3:10 PM  I have reviewed the triage vital signs and the nursing notes.   HISTORY  Chief Complaint Respiratory Distress    HPI Catherine BellingHelen K Holmes is a 82 y.o. female with a past medical history of hypertension, diabetes, COPD, CHF, hyperlipidemia, paroxysmal atrial fibrillation, presents the emergency department with difficulty breathing. According to EMS the patient was driven by her daughter to the EMS station. EMS states the patient was cyanotic appearing with very diminished breath sounds, largely unresponsive. They gave the patient to do an abscess, and the patient became much more responsive, asking for "the mask." They placed the patient on CPAP, but were only getting oxygen saturations in the upper 80s to around 90, and transported the patient to the emergency department. Upon arrival to the emergency department patient is very somnolent but awakens easily, transitioned to BiPAP. Denies any chest pain or abdominal pain. Patient states she continues to feel short of breath but she does admit it is better than it was. Patient has received 2 DuoNeb nebs by EMS. Patient continues to feel significantly short of breath.     Past Medical History  Diagnosis Date  . Essential hypertension   . Diabetes mellitus type II, controlled (HCC)   . COPD (chronic obstructive pulmonary disease) (HCC)   . Osteoporosis   . Carotid arterial disease (HCC)     a. 2012 Carotid dopplers: 40-59% R ICA, 60-79% L ICA  . Tic douloureux   . Arthralgia   . Sjoegren syndrome (HCC)   . TIA (transient ischemic attack)   . Emphysema   . MVP (mitral valve prolapse)   . Chronic diastolic CHF (congestive heart failure) (HCC)     a. 08/2011 Echo: EF 65-70%, mild LVH, mod dil LA, mildly dil RA, mild MR, mild-mod AoV Sclerosis w/o stenosis, mod-sev elev PA pressures, mild TR.  Marland Kitchen. Acid reflux   . Diverticulitis   . Anemia   . History of  pneumonia   . Syncope and collapse   . Anemia   . Hip fracture, left (HCC)   . Hyperlipidemia   . Hiatal hernia   . Chest pain     a. 08/2011 MV: EF 74%, no ischemia.  . Noncompliance     a. h/o not taking bumex as Rx.  Marland Kitchen. PAF (paroxysmal atrial fibrillation) (HCC)     a. CHA2DS2VASc = 8 ->No OAC 2/2 falls.    Patient Active Problem List   Diagnosis Date Noted  . Pressure ulcer 11/14/2015  . COPD exacerbation (HCC) 11/13/2015  . Acute on chronic respiratory failure (HCC) 11/13/2015  . Anxiety and depression 11/10/2015  . Iron deficiency anemia 10/28/2015  . Musculoskeletal chest pain 07/29/2015  . Sternal fracture 07/29/2015  . Pneumonia 07/29/2015  . Acute on chronic respiratory failure with hypoxia and hypercapnia (HCC) 07/28/2015  . Hyponatremia 04/11/2015  . Syncope 04/10/2015  . Absolute anemia   . Acute on chronic diastolic CHF (congestive heart failure) (HCC)   . CHF (congestive heart failure) (HCC) 03/31/2015  . Essential hypertension   . Diabetes mellitus type II, controlled (HCC)   . Hyposmolality and/or hyponatremia 12/04/2013  . Anemia 12/04/2013  . Chronic diastolic CHF (congestive heart failure) (HCC) 04/05/2013  . Positive fecal occult blood test 04/05/2013  . Fall 11/20/2012  . Chest tightness 07/13/2011  . Diabetes type 2, uncontrolled (HCC) 11/29/2009  . Hyperlipidemia 11/29/2009  . TIC DOULOUREUX 11/29/2009  . HYPERTENSION, BENIGN 11/29/2009  .  Mitral valve disorder 11/29/2009  . Congestive heart failure (HCC) 11/29/2009  . Carotid arterial disease (HCC) 11/29/2009  . TIA 11/29/2009  . PAD (peripheral artery disease) (HCC) 11/29/2009  . EMPHYSEMA 11/29/2009  . COPD (chronic obstructive pulmonary disease) (HCC) 11/29/2009  . GERD 11/29/2009  . SJOGREN'S SYNDROME 11/29/2009  . ARTHRALGIA 11/29/2009  . OSTEOPOROSIS 11/29/2009  . DYSPNEA 11/29/2009    Past Surgical History  Procedure Laterality Date  . Cholecystectomy  1965  . Abdominal  hysterectomy  1964  . Cataract extraction Bilateral   . Hip fracture surgery  2014    left hip    Current Outpatient Rx  Name  Route  Sig  Dispense  Refill  . albuterol (PROVENTIL) (2.5 MG/3ML) 0.083% nebulizer solution   Nebulization   Take 2.5 mg by nebulization every 6 (six) hours as needed for wheezing or shortness of breath.          . ALPRAZolam (XANAX) 0.25 MG tablet   Oral   Take 1 tablet (0.25 mg total) by mouth 3 (three) times daily as needed for anxiety.   30 tablet   0   . arformoterol (BROVANA) 15 MCG/2ML NEBU   Nebulization   Take 2 mLs (15 mcg total) by nebulization 2 (two) times daily.   120 mL   4   . aspirin EC 81 MG tablet   Oral   Take 81 mg by mouth daily.         . beta carotene w/minerals (OCUVITE) tablet   Oral   Take 1 tablet by mouth 2 (two) times daily.         . budesonide (PULMICORT) 0.5 MG/2ML nebulizer solution   Nebulization   Take 2 mLs (0.5 mg total) by nebulization 2 (two) times daily.      12   . bumetanide (BUMEX) 2 MG tablet   Oral   Take 1 tablet (2 mg total) by mouth daily.   60 tablet   11   . calcium carbonate (OS-CAL) 600 MG TABS tablet   Oral   Take 600 mg by mouth daily with breakfast.         . chlorhexidine (PERIDEX) 0.12 % solution   Mouth Rinse   by Mouth Rinse route 2 (two) times daily. Rinse and spit 2 times a day for 30 seconds.      12   . chlorthalidone (HYGROTON) 25 MG tablet   Oral   Take 1 tablet (25 mg total) by mouth daily as needed.   30 tablet   6   . citalopram (CELEXA) 20 MG tablet   Oral   Take 20 mg by mouth daily with lunch.          . diphenhydramine-acetaminophen (TYLENOL PM) 25-500 MG TABS   Oral   Take 1 tablet by mouth at bedtime as needed (for sleep.).          Marland Kitchen docusate sodium (COLACE) 100 MG capsule   Oral   Take 1 capsule (100 mg total) by mouth 2 (two) times daily.   10 capsule   0   . doxazosin (CARDURA) 4 MG tablet   Oral   Take 4 mg by mouth at  bedtime.          Marland Kitchen esomeprazole (NEXIUM) 40 MG capsule   Oral   Take 40 mg by mouth daily before breakfast.         . ezetimibe (ZETIA) 10 MG tablet   Oral   Take  10 mg by mouth at bedtime.          . fluticasone (FLONASE) 50 MCG/ACT nasal spray   Each Nare   Place 1-2 sprays into both nostrils at bedtime.          . gabapentin (NEURONTIN) 100 MG capsule   Oral   Take 100 mg by mouth 2 (two) times daily.          Marland Kitchen guaiFENesin-dextromethorphan (ROBITUSSIN DM) 100-10 MG/5ML syrup   Oral   Take 5 mLs by mouth every 4 (four) hours as needed for cough.   118 mL   0   . HYDROcodone-acetaminophen (NORCO/VICODIN) 5-325 MG tablet   Oral   Take 1 tablet by mouth every 4 (four) hours as needed for moderate pain.   30 tablet   0   . ipratropium-albuterol (DUONEB) 0.5-2.5 (3) MG/3ML SOLN   Nebulization   Take 3 mLs by nebulization every 4 (four) hours as needed.   360 mL      . magnesium oxide (MAG-OX) 400 (241.3 Mg) MG tablet   Oral   Take 1 tablet (400 mg total) by mouth daily.         . metoCLOPramide (REGLAN) 5 MG tablet   Oral   Take 5 mg by mouth 2 (two) times daily. Take before breakfast and supper.         . Multiple Vitamin (MULTIVITAMIN) tablet   Oral   Take 1 tablet by mouth every other day.          . mupirocin ointment (BACTROBAN) 2 %   Topical   Apply 1 application topically daily as needed (for sore on leg.).          Marland Kitchen nebivolol (BYSTOLIC) 5 MG tablet   Oral   Take 1 tablet (5 mg total) by mouth daily.   30 tablet   0   . nitroGLYCERIN (NITROSTAT) 0.4 MG SL tablet      PLACE 1 TABLET (0.4 MG TOTAL) UNDER THE TONGUE EVERY 5 (FIVE) MINUTES AS NEEDED FOR CHEST PAIN.   25 tablet   3   . potassium chloride (K-DUR,KLOR-CON) 10 MEQ tablet   Oral   Take 1 tablet (10 mEq total) by mouth daily.         . promethazine (PHENERGAN) 25 MG tablet   Oral   Take 25 mg by mouth every 8 (eight) hours as needed. For nausea and vomiting.       0   . SPIRIVA RESPIMAT 1.25 MCG/ACT AERS   Inhalation   Inhale 1 puff into the lungs daily.      5     Dispense as written.   Marland Kitchen spironolactone (ALDACTONE) 50 MG tablet   Oral   Take 25 mg by mouth every Monday, Wednesday, and Friday. Takes 1 tablet Mon. Wednesday and Friday.         Marland Kitchen spironolactone (ALDACTONE) 50 MG tablet      TAKE 1 TABLET DAILY AS NEEDED   90 tablet   3     Allergies Atorvastatin; Clindamycin; Codeine; Doxycycline; Epinephrine; Erythromycin; Fluticasone-salmeterol; Lasix; Levofloxacin; Penicillins; Propoxyphene hcl; Rosuvastatin; Simvastatin; Singlet; Sulfamethoxazole-trimethoprim; Teriparatide; Tetracyclines & related; Cefaclor; Glipizide; and Lisinopril  Family History  Problem Relation Age of Onset  . Heart attack Mother   . Heart attack Father   . Heart attack Sister     Social History Social History  Substance Use Topics  . Smoking status: Former Smoker -- 0.50 packs/day for 30 years  Types: Cigarettes    Quit date: 10/22/1994  . Smokeless tobacco: Never Used  . Alcohol Use: Yes    Review of Systems Constitutional: Negative for fever. Cardiovascular: Negative for chest pain. Respiratory: Positive for shortness of breath Gastrointestinal: Negative for abdominal pain Musculoskeletal: Negative for back pain. Neurological: Negative for headache 10-point ROS otherwise negative.  ____________________________________________   PHYSICAL EXAM:  Constitutional: Alert, somnolent but awakens easily. No distress currently but already on BiPAP during my examination. Eyes: Normal exam ENT   Head: Normocephalic and atraumatic.   Mouth/Throat: Mucous membranes are moist. Cardiovascular: Normal rate, regular rhythm. Respiratory: Moderate tachypnea, diminished breath sounds in all lung fields minimal expiratory wheeze. No obvious rhonchi or rales. Gastrointestinal: Soft and nontender. No distention.   Musculoskeletal: Nontender with  normal range of motion in all extremities. Mild lower extremity edema, nontender. Neurologic:  Normal speech and language. No gross focal neurologic deficits Skin:  Skin is warm, dry and intact.  Psychiatric: Somnolent, but acting appropriate for situation otherwise.  ____________________________________________    EKG  EKG reviewed and interpreted by myself shows normal sinus rhythm at 72 bpm, narrow QRS, normal axis, prolonged QTC of 504 ms, nonspecific but no concerning ST changes. Overall reassuring EKG.  ____________________________________________    RADIOLOGY  Chest x-ray shows decreased lung volumes, interstitial edema hiatal hernia but otherwise no acute findings  ____________________________________________  CRITICAL CARE Performed by: Minna Antis   Total critical care time: 30 minutes  Critical care time was exclusive of separately billable procedures and treating other patients.  Critical care was necessary to treat or prevent imminent or life-threatening deterioration.  Critical care was time spent personally by me on the following activities: development of treatment plan with patient and/or surrogate as well as nursing, discussions with consultants, evaluation of patient's response to treatment, examination of patient, obtaining history from patient or surrogate, ordering and performing treatments and interventions, ordering and review of laboratory studies, ordering and review of radiographic studies, pulse oximetry and re-evaluation of patient's condition.   INITIAL IMPRESSION / ASSESSMENT AND PLAN / ED COURSE  Pertinent labs & imaging results that were available during my care of the patient were reviewed by me and considered in my medical decision making (see chart for details).  Patient presents the emergency department with respiratory distress. Upon arrival patient is much improved per EMS, satting in the mid upper 90s on BiPAP currently. They state  the patient was cyanotic upon arrival, has received 2 DuoNeb nebs and is improving on CPAP/now BiPAP. We will administer Solu-Medrol, check labs, obtain a chest x-ray and monitor closely in the emergency department. Per record review the patient has a significant COPD history as well as a CHF history. Suspect likely COPD exacerbation.  Patient found to be hypotensive currently 84/51. Afebrile with a normal pulse and respiratory rate currently on BiPAP. Chest x-ray shows low lung volumes, interstitial edema.  Patient's blood pressures increased currently 97 systolic. Hemoglobin has resulted showing a 3. decreased. Rectal exam is strongly guaiac positive. I ordered a blood transfusion for the patient.  ____________________________________________   FINAL CLINICAL IMPRESSION(S) / ED DIAGNOSES  COPD exacerbation Respiratory distress GI bleed  Minna Antis, MD 01/10/16 1811

## 2016-01-10 NOTE — ED Notes (Signed)
Pt's daughter states that pt passed out in the car Sunday and they drove to EMS, who did EKG and then she went home. She had same type of episode today and was turning blue. Daughter states that pt has been unwell since [place]um. Pt now uses oxygen @ 2L most of the time.

## 2016-01-11 DIAGNOSIS — J9602 Acute respiratory failure with hypercapnia: Secondary | ICD-10-CM

## 2016-01-11 DIAGNOSIS — J9601 Acute respiratory failure with hypoxia: Principal | ICD-10-CM

## 2016-01-11 LAB — HEMOGLOBIN
HEMOGLOBIN: 9.1 g/dL — AB (ref 12.0–16.0)
Hemoglobin: 9.5 g/dL — ABNORMAL LOW (ref 12.0–16.0)
Hemoglobin: 9.2 g/dL — ABNORMAL LOW (ref 12.0–16.0)

## 2016-01-11 LAB — TYPE AND SCREEN
ABO/RH(D): O POS
Antibody Screen: NEGATIVE
UNIT DIVISION: 0

## 2016-01-11 LAB — CBC
HEMATOCRIT: 28.4 % — AB (ref 35.0–47.0)
HEMOGLOBIN: 9.2 g/dL — AB (ref 12.0–16.0)
MCH: 26 pg (ref 26.0–34.0)
MCHC: 32.5 g/dL (ref 32.0–36.0)
MCV: 79.9 fL — AB (ref 80.0–100.0)
Platelets: 319 10*3/uL (ref 150–440)
RBC: 3.56 MIL/uL — ABNORMAL LOW (ref 3.80–5.20)
RDW: 19.4 % — ABNORMAL HIGH (ref 11.5–14.5)
WBC: 5.1 10*3/uL (ref 3.6–11.0)

## 2016-01-11 LAB — BASIC METABOLIC PANEL
ANION GAP: 6 (ref 5–15)
BUN: 20 mg/dL (ref 6–20)
CHLORIDE: 96 mmol/L — AB (ref 101–111)
CO2: 33 mmol/L — ABNORMAL HIGH (ref 22–32)
Calcium: 8.2 mg/dL — ABNORMAL LOW (ref 8.9–10.3)
Creatinine, Ser: 0.59 mg/dL (ref 0.44–1.00)
GFR calc Af Amer: >60 mL/min (ref >60)
Glucose, Bld: 166 mg/dL — ABNORMAL HIGH (ref 65–99)
POTASSIUM: 4 mmol/L (ref 3.5–5.1)
SODIUM: 135 mmol/L (ref 135–145)

## 2016-01-11 LAB — LIPASE, BLOOD: Lipase: 13 U/L (ref 11–51)

## 2016-01-11 MED ORDER — NEBIVOLOL HCL 5 MG PO TABS
5.0000 mg | ORAL_TABLET | Freq: Every day | ORAL | Status: DC
  Administered 2016-01-11 – 2016-01-19 (×9): 5 mg via ORAL
  Filled 2016-01-11 (×9): qty 1

## 2016-01-11 MED ORDER — HEPARIN SODIUM (PORCINE) 5000 UNIT/ML IJ SOLN
5000.0000 [IU] | Freq: Three times a day (TID) | INTRAMUSCULAR | Status: DC
  Administered 2016-01-11 – 2016-01-12 (×3): 5000 [IU] via SUBCUTANEOUS
  Filled 2016-01-11 (×3): qty 1

## 2016-01-11 MED ORDER — ALPRAZOLAM 0.25 MG PO TABS
0.2500 mg | ORAL_TABLET | Freq: Three times a day (TID) | ORAL | Status: DC
  Administered 2016-01-11 – 2016-01-14 (×10): 0.25 mg via ORAL
  Filled 2016-01-11 (×10): qty 1

## 2016-01-11 NOTE — Care Management (Signed)
Spoke with patient's daughter Ms Josepha PiggHatley.  Patient ended up at the ems station on 2 accounts because she just happened to be in the care traveling somewhere and the station was the closest.  Patient was to have started outpatient pulmonary rehab next week.  She declined home health because daughter felt that patient would benefit more from getting out of the house rather than staying home.  She is now in agreement with discharge home with home health and hopefully achieve period of stability prior to going to outpatient pulmonary rehab.  Agency preference is [facility].  Heads up referral for SN PT OT

## 2016-01-11 NOTE — Progress Notes (Signed)
Pt's daughter told pt that she would be leaving. Pt currently on 5L Steward started to state she could not breathe. Pt became anxious and tachypenic. Nurse at bedside instructing pt to take deep breaths in through the nose and out through the mouth. The pt then began to moan when she was exhaling. Sats began to drop down to 84%. As the patient closed her eyes and practiced deep breathing, Sats returned to 96% on 5L and respiratory rate of 20. Pt remained on 5L and in no distressed.

## 2016-01-11 NOTE — Progress Notes (Signed)
Spoke with Daughter about pt's progress off Bipap. Informed daughter of scheduled Xanax now ordered for pt. Daughter stated pt lost her son in January 2016 and has never coped with his death. Stated pt has previously stated "I just pretend that he isn't really gone." Daughter stated that maybe patient just needs to be on something scheduled at discharge to help her deal with all her anxiety. Pt continuously states that she is scared and does not want to be left alone. During shift, Pt states nursing is gone too long and leaves her for too long of time when in actuality it was only 12 minutes.

## 2016-01-11 NOTE — Progress Notes (Signed)
Pharmacy ICU Daily Progress Note  HS is an 82yo female admitted 01/10/16 for SOB, unresponsive (appeared cyanotic per ems), .   PMH significant for hypertension, diabetes, COPD, CHF, hyperlipidemia, and paroxysmal atrial fibrillation.  Active pharmacy consults: none  Infectious:  Antimicrobials: None due to numerous allergies WBC 5.1 Afebrile Culture Results: MRSA PCR (-) Influenza A/B (-) H1N1 (-)  Electrolytes:  Potassium: 4 Magnesium: none Phosphorus: none Supplementation plans: none  Pulmonary: Bronchodilators? Albuterol nebs prn, budesonide 0.25mg  BID, spiriva Ventilator status: no O2 sat/FiO2: 94 on BiPAP, 35%  Current steroids: methylpred 60mg  IV q8hrs Taper plans:   GI:  Constipation PPx: none Feeding status: heart healthy diet LBM: 3/21 SUP: pantoprazole 40mg  IV q12hrs  Insulin:  SSI use in 24hrs: none Current Insulin orders: none Last 3 CBGs: 166, 150  DVT PPx: SCDs/TEDs, now on heparin 5000 SQ q8hrs. Initially not on chemical DVT due to: Anemia with drop in hemoglobin from last month. Patient guaiac positive as per ER physician with brown stool. On Protonix 40 mg IV twice a day. S/p 1 transfusion 1 unit PRBCs. Bumex IV prior and after transfusion.  Sedation+Pain: none RASS goal?  GCS 14 Last pain score: 0 Opioid use in last 24 hrs:   Pressors: none  MAP goal:   Medication education/counseling required? No

## 2016-01-11 NOTE — Care Management (Signed)
Patient well known to this CM admitted to icu stepdown for continuous bipap.   It is reported that patient's daughter drove patient to the ems station.  CM will investigate as to why ems was not called because it is reported that "patient was blue" when arrived at the station.

## 2016-01-11 NOTE — Consult Note (Signed)
[facility] Consultation     ASSESSMENT/PLAN    PULMONARY  A:Acute respiratory failure, secondary to acute COPD exacerbation and acute bronchitis. -Chronic hypercapnic respiratory failure, with hypoventilation, likely due to severe emphysema. -Chronic right lower lobe atelectasis due to intrathoracic stomach/hiatal hernia with right lung volume loss-likely contributing to hypoventilation. P:   -Patient appears improved today, will decrease steroid dose, wean down/off BiPAP. -Continue Spiriva. Continue nebulizers.   CARDIOVASCULAR  A: Chronic diastolic CHF. -Essential hypertension, BP currently elevated. P:  -Continue spironolactone, Bystolic, Cardura for blood pressure. -We'll add when necessary medications for blood pressure control as needed.  RENAL A:  --  GASTROINTESTINAL A:  -Large hiatal hernia with intrathoracic stomach, leading to chronic right lower lobe atelectasis. -Continue Protonix.  HEMATOLOGIC A:  -  INFECTIOUS A: Acute bronchitis, leading to acute exacerbation of COPD with respiratory failure. Suspect a viral etiology at this time.  BCx2 3/21: Pending UC -- Sputum pending Abx: -- MRSA screen negative. 3/21. Influenza screen negative. 3/21.  ENDOCRINE A: Will monitor blood sugars, and start sliding scale if indicated.  NEUROLOGIC A:  Acute metabolic encephalopathy, likely due to hypercapnic respiratory failure. . Will wean down BiPAP today, and monitor mental status.   MAJOR EVENTS/TEST RESULTS:  ---------------------------------------  ---------------------------------------   Name: Catherine Holmes MRN: [id number] DOB: 06/06/1928    ADMISSION DATE:  01/10/2016 CONSULTATION DATE:  01/11/16  REFERRING MD :  Dr. Hilton Sinclair.   CHIEF COMPLAINT:  Dyspnea.    HISTORY OF PRESENT ILLNESS:    Currently, the patient has lethargic, therefore, all history was obtained from the chart, and from staff.  The patient is a  82 year old female with history and presentation as per the ER note below. She arrived to the hospital respiratory distress, thought to be secondary to acute bronchitis with COPD exacerbation. Currently on initial evaluation, she was noted to be somewhat cyanotic, and lethargic. She was given 2 nebulization treatments and end. Upon arrival to the emergency room was doing somewhat better, she was then started on BiPAP. Previous blood gas on 11/15/2015 showed a pH of 7.48/61/77/45.4, consistent with the chronic hypercapnic respiratory failure. CO2, and her chemistry panel was elevated at 33. Renal function was normal. Her troponin was negative, influenza and MRSA screen were negative. She has a history of COPD, apparently she has seen Dr. Meredeth Ide in the past. She is on 2 L of home oxygen at home.  Chest x-ray images from this admission and reports reviewed and per with previous images, as well as most recent CT of the chest. These are consistent with large hiatal hernia/intrathoracic stomach, with chronic right lower lobe atelectasis that appears to be worse on this most recent chest x-ray.  Per ED:  Catherine Holmes is a 82 y.o. female with a past medical history of hypertension, diabetes, COPD, CHF, hyperlipidemia, paroxysmal atrial fibrillation, presents the emergency department with difficulty breathing. According to EMS the patient was driven by her daughter to the EMS station. EMS states the patient was cyanotic appearing with very diminished breath sounds, largely unresponsive. They gave the patient to do an abscess, and the patient became much more responsive, asking for "the mask." They placed the patient on CPAP, but were only getting oxygen saturations in the upper 80s to around 90, and transported the patient to the emergency department. Upon arrival to the emergency department patient is very somnolent but awakens easily, transitioned to BiPAP.   PAST MEDICAL HISTORY :  Past Medical History  Diagnosis Date  . Essential hypertension   . Diabetes mellitus type II, controlled (HCC)   . COPD (chronic obstructive pulmonary disease) (HCC)   . Osteoporosis   . Carotid arterial disease (HCC)     a. 2012 Carotid dopplers: 40-59% R ICA, 60-79% L ICA  . Tic douloureux   . Arthralgia   . Sjoegren syndrome (HCC)   . TIA (transient ischemic attack)   . Emphysema   . MVP (mitral valve prolapse)   . Chronic diastolic CHF (congestive heart failure) (HCC)     a. 08/2011 Echo: EF 65-70%, mild LVH, mod dil LA, mildly dil RA, mild MR, mild-mod AoV Sclerosis w/o stenosis, mod-sev elev PA pressures, mild TR.  Marland Kitchen Acid reflux   . Diverticulitis   . Anemia   . History of pneumonia   . Syncope and collapse   . Anemia   . Hip fracture, left (HCC)   . Hyperlipidemia   . Hiatal hernia   . Chest pain     a. 08/2011 MV: EF 74%, no ischemia.  . Noncompliance     a. h/o not taking bumex as Rx.  Marland Kitchen PAF (paroxysmal atrial fibrillation) (HCC)     a. CHA2DS2VASc = 8 ->No OAC 2/2 falls.   Past Surgical History  Procedure Laterality Date  . Cholecystectomy  1965  . Abdominal hysterectomy  1964  . Cataract extraction Bilateral   . Hip fracture surgery  2014    left hip   Prior to Admission medications   Medication Sig Start Date End Date Taking? Authorizing Provider  albuterol (PROVENTIL) (2.5 MG/3ML) 0.083% nebulizer solution Take 2.5 mg by nebulization every 6 (six) hours as needed for wheezing or shortness of breath.    Yes Historical Provider, MD  ALPRAZolam (XANAX) 0.25 MG tablet Take 1 tablet (0.25 mg total) by mouth 3 (three) times daily as needed for anxiety. 11/21/15  Yes Srikar Sudini, MD  aspirin EC 81 MG tablet Take 81 mg by mouth daily with lunch.    Yes Historical Provider, MD  bumetanide (BUMEX) 1 MG tablet Take 1-2 mg by mouth 2 (two) times daily as needed (for edema).   Yes Historical Provider, MD  calcium carbonate (OSCAL) 1500 (600 Ca) MG TABS tablet Take 600 mg of elemental  calcium by mouth daily with breakfast.   Yes Historical Provider, MD  chlorhexidine (PERIDEX) 0.12 % solution 15 mLs by Mouth Rinse route at bedtime.    Yes Historical Provider, MD  citalopram (CELEXA) 20 MG tablet Take 20 mg by mouth daily with lunch.    Yes Historical Provider, MD  diphenhydramine-acetaminophen (TYLENOL PM) 25-500 MG TABS tablet Take 1 tablet by mouth at bedtime as needed (for sleep).   Yes Historical Provider, MD  doxazosin (CARDURA) 4 MG tablet Take 4 mg by mouth at bedtime.    Yes Antonieta Iba, MD  ezetimibe (ZETIA) 10 MG tablet Take 10 mg by mouth at bedtime.    Yes Historical Provider, MD  fluticasone (FLONASE) 50 MCG/ACT nasal spray Place 1 spray into both nostrils 2 (two) times daily.    Yes Historical Provider, MD  gabapentin (NEURONTIN) 100 MG capsule Take 100 mg by mouth 2 (two) times daily.    Yes Historical Provider, MD  guaiFENesin-dextromethorphan (ROBITUSSIN DM) 100-10 MG/5ML syrup Take 5 mLs by mouth every 4 (four) hours as needed for cough. 11/21/15  Yes Srikar Sudini, MD  HYDROcodone-acetaminophen (NORCO/VICODIN) 5-325 MG tablet Take 1 tablet by mouth every 4 (four) hours as needed  for moderate pain. 11/21/15  Yes Srikar Sudini, MD  mometasone-formoterol (DULERA) 100-5 MCG/ACT AERO Inhale 2 puffs into the lungs 2 (two) times daily.   Yes Historical Provider, MD  mupirocin ointment (BACTROBAN) 2 % Apply 1 application topically daily as needed (for sores on leg).    Yes Historical Provider, MD  nebivolol (BYSTOLIC) 5 MG tablet Take 1 tablet (5 mg total) by mouth daily. 04/03/15  Yes Adrian Saran, MD  nitroGLYCERIN (NITROSTAT) 0.4 MG SL tablet Place 0.4 mg under the tongue every 5 (five) minutes as needed for chest pain.   Yes Historical Provider, MD  Polyethyl Glycol-Propyl Glycol (SYSTANE OP) Place 1 drop into both eyes 2 (two) times daily.   Yes Historical Provider, MD  promethazine (PHENERGAN) 25 MG tablet Take 25 mg by mouth every 8 (eight) hours as needed for  nausea or vomiting.    Yes Historical Provider, MD  SPIRIVA RESPIMAT 1.25 MCG/ACT AERS Inhale 1 puff into the lungs daily.   Yes Historical Provider, MD  spironolactone (ALDACTONE) 25 MG tablet Take 25 mg by mouth every Monday, Wednesday, and Friday.   Yes Historical Provider, MD   Allergies  Allergen Reactions  . Singlet [Chlorphen-Pe-Acetaminophen] Shortness Of Breath  . Atorvastatin Other (See Comments)    Reaction:  Unknown   . Clindamycin Diarrhea and Nausea And Vomiting  . Codeine Other (See Comments)    Reaction:  Unknown   . Doxycycline Hives and Itching  . Epinephrine Hives and Itching  . Erythromycin Hives and Itching  . Fluticasone-Salmeterol Other (See Comments)    Reaction:  Unknown   . Lasix [Furosemide] Itching  . Levofloxacin Other (See Comments)    Reaction:  Unknown   . Penicillins Other (See Comments)    Reaction:  Unknown   . Propoxyphene Hcl Other (See Comments)    Reaction:  Unknown   . Rosuvastatin Other (See Comments)    Reaction:  Unknown   . Simvastatin Other (See Comments)    Reaction:  Unknown   . Sulfamethoxazole-Trimethoprim Other (See Comments)    Reaction:  Unknown   . Teriparatide Other (See Comments)    Reaction:  Unknown   . Tetracyclines & Related Hives and Itching  . Ceclor [Cefaclor] Hives and Other (See Comments)    Reaction:  Altered mental status    . Glipizide Hives and Itching  . Lisinopril Hives and Itching    FAMILY HISTORY:  Family History  Problem Relation Age of Onset  . Heart attack Mother   . Heart attack Father   . Heart attack Sister    SOCIAL HISTORY:  reports that she quit smoking about 21 years ago. Her smoking use included Cigarettes. She has a 15 pack-year smoking history. She has never used smokeless tobacco. She reports that she drinks alcohol. She reports that she does not use illicit drugs.  REVIEW OF SYSTEMS:   Could not be obtained as patient is currently on BiPAP.   VITAL SIGNS: Temp:  [96.7 F  (35.9 C)-98.5 F (36.9 C)] 97 F (36.1 C) (03/22 0400) Pulse Rate:  [68-83] 76 (03/22 0831) Resp:  [13-22] 18 (03/22 0831) BP: (80-181)/(51-143) 165/63 mmHg (03/22 0600) SpO2:  [95 %-100 %] 100 % (03/22 0831) FiO2 (%):  [35 %] 35 % (03/22 0400) Weight:  [150 lb (68.04 kg)-164 lb 3.9 oz (74.5 kg)] 164 lb 3.9 oz (74.5 kg) (03/21 2151) HEMODYNAMICS:   VENTILATOR SETTINGS: Vent Mode:  [-]  FiO2 (%):  [35 %] 35 % INTAKE /  OUTPUT:  Intake/Output Summary (Last 24 hours) at 01/11/16 0851 Last data filed at 01/11/16 0033  Gross per 24 hour  Intake    564 ml  Output    325 ml  Net    239 ml    Physical Examination:   VS: BP 165/63 mmHg  Pulse 76  Temp(Src) 97 F (36.1 C) (Axillary)  Resp 18  Ht 5' (1.524 m)  Wt 164 lb 3.9 oz (74.5 kg)  BMI 32.08 kg/m2  SpO2 100%  General Appearance: No distress  Neuro:without focal findings, mental statusIs reduced, that the patient is arousable. HEENT: PERRLA, EOM intact, Pulmonary: Decreased breath sounds bilaterally. CardiovascularNormal S1,S2.  No m/r/g.    Abdomen: Benign, Soft, non-tender,  Renal:  No costovertebral tenderness  GU:  Not performed at this time. Endoc: No evident thyromegaly, no signs of acromegaly. Skin:   warm, skin appears dry Extremities: normal, no cyanosis, clubbing, no edema, warm with normal capillary refill.    LABS: Reviewed   LABORATORY PANEL:   CBC  Recent Labs Lab 01/11/16 0537  WBC 5.1  HGB 9.2*  HCT 28.4*  PLT 319    Chemistries   Recent Labs Lab 01/10/16 1526 01/11/16 0537  NA 133* 135  K 3.7 4.0  CL 95* 96*  CO2 32 33*  GLUCOSE 150* 166*  BUN 21* 20  CREATININE 0.67 0.59  CALCIUM 8.2* 8.2*  AST 19  --   ALT 21  --   ALKPHOS 67  --   BILITOT 0.4  --      Recent Labs Lab 01/10/16 2157  GLUCAP 191*   No results for input(s): PHART, PCO2ART, PO2ART in the last 168 hours.  Recent Labs Lab 01/10/16 1526  AST 19  ALT 21  ALKPHOS 67  BILITOT 0.4  ALBUMIN 3.0*     Cardiac Enzymes  Recent Labs Lab 01/10/16 2238  TROPONINI <0.03    RADIOLOGY:  Dg Chest Portable 1 View  01/10/2016  CLINICAL DATA:  Unresponsive EXAM: PORTABLE CHEST 1 VIEW COMPARISON:  12/26/2005 FINDINGS: Stable enlarged cardiac silhouette. Large hiatal hernia again noted. Decrease in lung volumes compared to prior. Mild interstitial edema pattern noted. No focal infiltrate. No pneumothorax. No overt pulmonary edema. IMPRESSION: 1. Decrease in lung volumes compared to prior. 2. Mild interstitial edema pattern. 3. Large hiatal hernia and cardiomegaly. Electronically Signed   By: Genevive BiStewart  Edmunds M.D.   On: 01/10/2016 15:42       --Wells Guileseep Shilynn Hoch, MD.  Board Certified in Internal Medicine, Pulmonary Medicine, Critical Care Medicine, and Sleep Medicine.  [facility]   Santiago Gladavid Kasa, M.D.  Stephanie AcreVishal Mungal, M.D.  Billy Fischeravid Simonds, M.D   01/11/2016, 8:51 AM   Critical Care Attestation.  I have personally obtained a history, examined the patient, evaluated laboratory and imaging results, formulated the assessment and plan and placed orders. The Patient requires high complexity decision making for assessment and support, frequent evaluation and titration of therapies, application of advanced monitoring technologies and extensive interpretation of multiple databases. The patient has critical illness that could lead imminently to failure of 1 or more organ systems and requires the highest level of physician preparedness to intervene.  Critical Care Time devoted to patient care services described in this note is 35 minutes and is exclusive of time spent in procedures.

## 2016-01-11 NOTE — Progress Notes (Signed)
[facility] [place] [facility]   PATIENT NAME: Catherine Holmes    MR#:  [id number]  DATE OF BIRTH:  04-17-1928  SUBJECTIVE:  CHIEF COMPLAINT:   Chief Complaint  Patient presents with  . Respiratory Distress  still requiring BIPAP, SOB  REVIEW OF SYSTEMS:  Review of Systems  Unable to perform ROS: acuity of condition   DRUG ALLERGIES:   Allergies  Allergen Reactions  . Singlet [Chlorphen-Pe-Acetaminophen] Shortness Of Breath  . Atorvastatin Other (See Comments)    Reaction:  Unknown   . Clindamycin Diarrhea and Nausea And Vomiting  . Codeine Other (See Comments)    Reaction:  Unknown   . Doxycycline Hives and Itching  . Epinephrine Hives and Itching  . Erythromycin Hives and Itching  . Fluticasone-Salmeterol Other (See Comments)    Reaction:  Unknown   . Lasix [Furosemide] Itching  . Levofloxacin Other (See Comments)    Reaction:  Unknown   . Penicillins Other (See Comments)    Reaction:  Unknown   . Propoxyphene Hcl Other (See Comments)    Reaction:  Unknown   . Rosuvastatin Other (See Comments)    Reaction:  Unknown   . Simvastatin Other (See Comments)    Reaction:  Unknown   . Sulfamethoxazole-Trimethoprim Other (See Comments)    Reaction:  Unknown   . Teriparatide Other (See Comments)    Reaction:  Unknown   . Tetracyclines & Related Hives and Itching  . Ceclor [Cefaclor] Hives and Other (See Comments)    Reaction:  Altered mental status    . Glipizide Hives and Itching  . Lisinopril Hives and Itching   VITALS:  Blood pressure 161/67, pulse 90, temperature 98.3 F (36.8 C), temperature source Oral, resp. rate 23, height 5' (1.524 m), weight 74.5 kg (164 lb 3.9 oz), SpO2 100 %. PHYSICAL EXAMINATION:  Physical Exam  Constitutional: She is oriented to person, place, and time and well-developed, well-nourished, and in no distress.  HENT:  Head: Normocephalic and atraumatic.  Eyes: Conjunctivae and EOM are normal. Pupils are  equal, round, and reactive to light.  Neck: Normal range of motion. Neck supple. No tracheal deviation present. No thyromegaly present.  Cardiovascular: Normal rate, regular rhythm and normal heart sounds.   Pulmonary/Chest: She is in respiratory distress. She has decreased breath sounds. She has no wheezes. She exhibits no tenderness.  Abdominal: Soft. Bowel sounds are normal. She exhibits no distension. There is no tenderness.  Musculoskeletal: Normal range of motion.  Neurological: She is alert and oriented to person, place, and time. No cranial nerve deficit.  Skin: Skin is warm and dry. No rash noted.  Psychiatric: Mood and affect normal.   LABORATORY PANEL:   CBC  Recent Labs Lab 01/11/16 0537 01/11/16 0952  WBC 5.1  --   HGB 9.2* 9.2*  HCT 28.4*  --   PLT 319  --    ------------------------------------------------------------------------------------------------------------------ Chemistries   Recent Labs Lab 01/10/16 1526 01/11/16 0537  NA 133* 135  K 3.7 4.0  CL 95* 96*  CO2 32 33*  GLUCOSE 150* 166*  BUN 21* 20  CREATININE 0.67 0.59  CALCIUM 8.2* 8.2*  AST 19  --   ALT 21  --   ALKPHOS 67  --   BILITOT 0.4  --    RADIOLOGY:  Dg Chest Portable 1 View  01/10/2016  CLINICAL DATA:  Unresponsive EXAM: PORTABLE CHEST 1 VIEW COMPARISON:  12/26/2005 FINDINGS: Stable enlarged cardiac silhouette. Large hiatal hernia again  noted. Decrease in lung volumes compared to prior. Mild interstitial edema pattern noted. No focal infiltrate. No pneumothorax. No overt pulmonary edema. IMPRESSION: 1. Decrease in lung volumes compared to prior. 2. Mild interstitial edema pattern. 3. Large hiatal hernia and cardiomegaly. Electronically Signed   By: Genevive Bi M.D.   On: 01/10/2016 15:42   ASSESSMENT AND PLAN:  1. Acute respiratory failure with hypoxia. Patient currently on BiPAP in order to oxygenate as she drops her O2 very easily.  2. COPD exacerbation: continue IV  Solu-Medrol. Continue nebulizers, add budesonide nebulizers. Continue Spiriva. 3. Anemia: Hb 7.2 on admission with drop in hemoglobin from last month. Patient guaiac positive as per ER physician with brown stool. continue Protonix 40 mg IV twice a day. S/P Transfusion of 1 unit of packed red blood cells. Bumex IV prior and after transfusion. 4. Chronic diastolic congestive heart failure. Bumex IV prior and after blood 5. Relative hypotension: resolved. BP high now. 6. Impaired fasting glucose check a hemoglobin A1c. on sliding scale     All the records are reviewed and case discussed with Care Management/Social Worker. Management plans discussed with the patient, family and they are in agreement.  CODE STATUS: FULL CODE  TOTAL TIME TAKING CARE OF THIS PATIENT: 35 minutes.   More than 50% of the time was spent in counseling/coordination of care: YES  POSSIBLE D/C IN 1-2 DAYS, DEPENDING ON CLINICAL CONDITION.   [facility], Cathe Bilger M.D on 01/11/2016 at 2:42 PM  Between 7am to 6pm - Pager - [phone]  After 6pm go to [email or web address] - password [email or web address]  [facility]  Office  [phone]  CC: Primary care physician; Marisue Ivan, MD  Note: This dictation was prepared with Dragon dictation along with smaller phrase technology. Any transcriptional errors that result from this process are unintentional.

## 2016-01-12 ENCOUNTER — Ambulatory Visit: Payer: Medicare Other | Admitting: Cardiovascular Disease

## 2016-01-12 DIAGNOSIS — F320 Major depressive disorder, single episode, mild: Secondary | ICD-10-CM

## 2016-01-12 LAB — CBC
HEMATOCRIT: 26 % — AB (ref 35.0–47.0)
Hemoglobin: 8.4 g/dL — ABNORMAL LOW (ref 12.0–16.0)
MCH: 26.2 pg (ref 26.0–34.0)
MCHC: 32.4 g/dL (ref 32.0–36.0)
MCV: 80.9 fL (ref 80.0–100.0)
Platelets: 310 10*3/uL (ref 150–440)
RBC: 3.21 MIL/uL — AB (ref 3.80–5.20)
RDW: 19.8 % — ABNORMAL HIGH (ref 11.5–14.5)
WBC: 4.7 10*3/uL (ref 3.6–11.0)

## 2016-01-12 LAB — TROPONIN I
Troponin I: <0.03 ng/mL (ref <0.031)
Troponin I: <0.03 ng/mL (ref <0.031)
Troponin I: <0.03 ng/mL (ref <0.031)

## 2016-01-12 LAB — HEMOGLOBIN A1C: Hgb A1c MFr Bld: 6.2 % — ABNORMAL HIGH (ref 4.0–6.0)

## 2016-01-12 LAB — BASIC METABOLIC PANEL
ANION GAP: 5 (ref 5–15)
BUN: 24 mg/dL — ABNORMAL HIGH (ref 6–20)
CO2: 34 mmol/L — AB (ref 22–32)
Calcium: 8.2 mg/dL — ABNORMAL LOW (ref 8.9–10.3)
Chloride: 93 mmol/L — ABNORMAL LOW (ref 101–111)
Creatinine, Ser: 0.59 mg/dL (ref 0.44–1.00)
GFR calc Af Amer: >60 mL/min (ref >60)
GFR calc non Af Amer: >60 mL/min (ref >60)
GLUCOSE: 203 mg/dL — AB (ref 65–99)
POTASSIUM: 4.3 mmol/L (ref 3.5–5.1)
Sodium: 132 mmol/L — ABNORMAL LOW (ref 135–145)

## 2016-01-12 LAB — HEMOGLOBIN
Hemoglobin: 8.8 g/dL — ABNORMAL LOW (ref 12.0–16.0)
Hemoglobin: 8.6 g/dL — ABNORMAL LOW (ref 12.0–16.0)

## 2016-01-12 MED ORDER — ENSURE ENLIVE PO LIQD
237.0000 mL | Freq: Two times a day (BID) | ORAL | Status: DC
  Administered 2016-01-13 – 2016-01-19 (×11): 237 mL via ORAL

## 2016-01-12 MED ORDER — ALPRAZOLAM 0.5 MG PO TABS
0.5000 mg | ORAL_TABLET | Freq: Once | ORAL | Status: AC
  Administered 2016-01-12: 0.5 mg via ORAL
  Filled 2016-01-12: qty 1

## 2016-01-12 MED ORDER — METHYLPREDNISOLONE SODIUM SUCC 40 MG IJ SOLR
40.0000 mg | Freq: Two times a day (BID) | INTRAMUSCULAR | Status: DC
  Administered 2016-01-12 – 2016-01-15 (×6): 40 mg via INTRAVENOUS
  Filled 2016-01-12 (×6): qty 1

## 2016-01-12 MED ORDER — HYDRALAZINE HCL 10 MG PO TABS
10.0000 mg | ORAL_TABLET | ORAL | Status: DC | PRN
  Administered 2016-01-12 (×2): 10 mg via ORAL
  Filled 2016-01-12 (×6): qty 1

## 2016-01-12 MED ORDER — ENOXAPARIN SODIUM 40 MG/0.4ML ~~LOC~~ SOLN
40.0000 mg | SUBCUTANEOUS | Status: DC
  Administered 2016-01-12 – 2016-01-19 (×8): 40 mg via SUBCUTANEOUS
  Filled 2016-01-12 (×8): qty 0.4

## 2016-01-12 MED ORDER — HYDRALAZINE HCL 25 MG PO TABS
25.0000 mg | ORAL_TABLET | Freq: Three times a day (TID) | ORAL | Status: DC
  Administered 2016-01-12 – 2016-01-19 (×13): 25 mg via ORAL
  Filled 2016-01-12 (×17): qty 1

## 2016-01-12 NOTE — Consult Note (Signed)
[facility] Face-to-Face Psychiatry Consult   Reason for Consult:  Consult for this 82 year old woman currently in the hospital for respiratory failure who has multiple chronic medical problems. Concern about depression Referring Physician:  Manuella Ghazi Patient Identification: Catherine Holmes MRN:  [id number] Principal Diagnosis: Major depression mild single Diagnosis:   Patient Active Problem List   Diagnosis Date Noted  . Depression, major, single episode, mild ([place]) [F32.0] 01/12/2016  . Acute respiratory failure ([place]) [J96.00] 01/10/2016  . Pressure ulcer [L89.90] 11/14/2015  . COPD exacerbation ([place]) [J44.1] 11/13/2015  . Acute on chronic respiratory failure ([place]) [J96.20] 11/13/2015  . Anxiety and depression [F41.8] 11/10/2015  . Iron deficiency anemia [D50.9] 10/28/2015  . Musculoskeletal chest pain [R07.89] 07/29/2015  . Sternal fracture [S22.20XA] 07/29/2015  . Pneumonia [J18.9] 07/29/2015  . Acute on chronic respiratory failure with hypoxia and hypercapnia (HCC) [Y63.78, J96.22] 07/28/2015  . Hyponatremia [E87.1] 04/11/2015  . Syncope [R55] 04/10/2015  . Absolute anemia [D64.9]   . Acute on chronic diastolic CHF (congestive heart failure) ([place]) [I50.33]   . CHF (congestive heart failure) ([place]) [I50.9] 03/31/2015  . Essential hypertension [I10]   . Diabetes mellitus type II, controlled ([place]) [E11.9]   . Hyposmolality and/or hyponatremia [E87.1] 12/04/2013  . Anemia [D64.9] 12/04/2013  . Chronic diastolic CHF (congestive heart failure) ([place]) [I50.32] 04/05/2013  . Positive fecal occult blood test [R19.5] 04/05/2013  . Fall [W19.XXXA] 11/20/2012  . Chest tightness [R07.89] 07/13/2011  . Diabetes type 2, uncontrolled ([place]) [E11.65] 11/29/2009  . Hyperlipidemia [E78.5] 11/29/2009  . TIC DOULOUREUX [G50.0] 11/29/2009  . HYPERTENSION, BENIGN [I10] 11/29/2009  . Mitral valve disorder [I05.9] 11/29/2009  . Congestive heart failure ([place]) [I50.9] 11/29/2009  . Carotid arterial disease ([place])  [I77.9] 11/29/2009  . TIA [G45.9] 11/29/2009  . PAD (peripheral artery disease) ([place]) [I73.9] 11/29/2009  . EMPHYSEMA [J43.8] 11/29/2009  . COPD (chronic obstructive pulmonary disease) ([place]) [J44.9] 11/29/2009  . GERD [K21.9] 11/29/2009  . SJOGREN'S SYNDROME [M35.00] 11/29/2009  . ARTHRALGIA [M25.50] 11/29/2009  . OSTEOPOROSIS [M81.0] 11/29/2009  . DYSPNEA [R06.02] 11/29/2009    Total Time spent with patient: 1 hour  Subjective:   Catherine Holmes is a 82 y.o. female patient admitted with "I'm not too well".  HPI:  Patient interviewed. Chart reviewed. Labs reviewed medications reviewed. 82 year old woman currently in the hospital with respiratory failure. Multiple medical problems. Concern raised about depression particularly in the context of the death of her son. Patient's son died at the end of December 5885 by self-inflicted gunshot wound. Patient says that she has been feeling bad ever since September however. In September she had some kind of medical incident in which she was administered CPR briefly, whether properly or not, and apparently suffered a broken sternum. Ever since that time she has had pain and has felt like her respiratory problems and all of her health have been worse. On top of that of course her son killed himself at the end of December 2016. Patient tells me that she cannot accept it and she refuses to think about it because she gets too overwhelmed with emotion when she thinks about it. That said, she actually is able to talk about why to bit of it spontaneously. She says that she gets down quite a bit at times but doesn't necessarily feel bad all the time. She has some trouble sleeping a lot of which she blames on her medical problems. Her appetite appears to be normal. Patient absolutely denies any suicidal ideation. Denies any psychotic symptoms. She  does say that her energy is down but she is able to articulate several positive things in her life including her dogs, her  daughter, her religious faith and some general optimism in that regard. Does not drink does not abuse substances.  Social history: Patient lives with her daughter. As mentioned previously had a son who committed suicide recently. Seems to have a good relationship with the remainder of her family.  Medical history: Long list of medical problems including heart failure diabetes hypertension. Quite sick right now. Now needing 24-hour day oxygen.  Substance abuse history: Denies any use of alcohol or drugs current or past  Past Psychiatric History: Patient thinks that she seems to remember many many years ago having been treated for depression but has never been in a psychiatric hospital. No history of suicide attempts. Remember the names of any medicine. She was not aware of the fact that she is currently on antidepressant medicine. No other history that I could find in the chart specifically related to psychiatric illness. Currently she is on standing doses of Xanax and citalopram. No history of mania or psychosis.  Risk to Self: Is patient at risk for suicide?: No Risk to Others:   Prior Inpatient Therapy:   Prior Outpatient Therapy:    Past Medical History:  Past Medical History  Diagnosis Date  . Essential hypertension   . Diabetes mellitus type II, controlled ([place])   . COPD (chronic obstructive pulmonary disease) ([place])   . Osteoporosis   . Carotid arterial disease ([place])     a. 2012 Carotid dopplers: 40-59% R ICA, 60-79% L ICA  . Tic douloureux   . Arthralgia   . Sjoegren syndrome ([place])   . TIA (transient ischemic attack)   . Emphysema   . MVP (mitral valve prolapse)   . Chronic diastolic CHF (congestive heart failure) ([place])     a. 08/2011 Echo: EF 65-70%, mild LVH, mod dil LA, mildly dil RA, mild MR, mild-mod AoV Sclerosis w/o stenosis, mod-sev elev PA pressures, mild TR.  Marland Kitchen Acid reflux   . Diverticulitis   . Anemia   . History of pneumonia   . Syncope and collapse   . Anemia    . Hip fracture, left ([place])   . Hyperlipidemia   . Hiatal hernia   . Chest pain     a. 08/2011 MV: EF 74%, no ischemia.  . Noncompliance     a. h/o not taking bumex as Rx.  Marland Kitchen PAF (paroxysmal atrial fibrillation) (HCC)     a. CHA2DS2VASc = 8 ->No OAC 2/2 falls.    Past Surgical History  Procedure Laterality Date  . Cholecystectomy  1965  . Abdominal hysterectomy  1964  . Cataract extraction Bilateral   . Hip fracture surgery  2014    left hip   Family History:  Family History  Problem Relation Age of Onset  . Heart attack Mother   . Heart attack Father   . Heart attack Sister    Family Psychiatric  History: Son committed suicide but I did not inquire in depth about whether he had any particular kind of mental health problem. She denies knowing of any other mental health issues in the family. Social History:  History  Alcohol Use  . Yes     History  Drug Use No    Social History   Social History  . Marital Status: Divorced    Spouse Name: N/A  . Number of Children: 2  . Years of  Education: N/A   Occupational History  . Retired     former Chemical engineer   Social History Main Topics  . Smoking status: Former Smoker -- 0.50 packs/day for 30 years    Types: Cigarettes    Quit date: 10/22/1994  . Smokeless tobacco: Never Used  . Alcohol Use: Yes  . Drug Use: No  . Sexual Activity: Not Asked   Other Topics Concern  . None   Social History Narrative   Additional Social History:    Allergies:   Allergies  Allergen Reactions  . Singlet [Chlorphen-Pe-Acetaminophen] Shortness Of Breath  . Atorvastatin Other (See Comments)    Reaction:  Unknown   . Clindamycin Diarrhea and Nausea And Vomiting  . Codeine Other (See Comments)    Reaction:  Unknown   . Doxycycline Hives and Itching  . Epinephrine Hives and Itching  . Erythromycin Hives and Itching  . Fluticasone-Salmeterol Other (See Comments)    Reaction:  Unknown   . Lasix [Furosemide] Itching  .  Levofloxacin Other (See Comments)    Reaction:  Unknown   . Penicillins Other (See Comments)    Reaction:  Unknown   . Propoxyphene Hcl Other (See Comments)    Reaction:  Unknown   . Rosuvastatin Other (See Comments)    Reaction:  Unknown   . Simvastatin Other (See Comments)    Reaction:  Unknown   . Sulfamethoxazole-Trimethoprim Other (See Comments)    Reaction:  Unknown   . Teriparatide Other (See Comments)    Reaction:  Unknown   . Tetracyclines & Related Hives and Itching  . Ceclor [Cefaclor] Hives and Other (See Comments)    Reaction:  Altered mental status    . Glipizide Hives and Itching  . Lisinopril Hives and Itching    Labs:  Results for orders placed or performed during the hospital encounter of 01/10/16 (from the past 48 hour(s))  MRSA PCR Screening     Status: None   Collection Time: 01/10/16  9:48 PM  Result Value Ref Range   MRSA by PCR NEGATIVE NEGATIVE    Comment:        The GeneXpert MRSA Assay (FDA approved for NASAL specimens only), is one component of a comprehensive MRSA colonization surveillance program. It is not intended to diagnose MRSA infection nor to guide or monitor treatment for MRSA infections.   Influenza panel by PCR (type A & B, H1N1)     Status: None   Collection Time: 01/10/16  9:55 PM  Result Value Ref Range   Influenza A By PCR NEGATIVE NEGATIVE   Influenza B By PCR NEGATIVE NEGATIVE   H1N1 flu by pcr NOT DETECTED NOT DETECTED    Comment:        The Xpert Flu assay (FDA approved for nasal aspirates or washes and nasopharyngeal swab specimens), is intended as an aid in the diagnosis of influenza and should not be used as a sole basis for treatment.   Glucose, capillary     Status: Abnormal   Collection Time: 01/10/16  9:57 PM  Result Value Ref Range   Glucose-Capillary 191 (H) 65 - 99 mg/dL  Troponin I     Status: None   Collection Time: 01/10/16 10:38 PM  Result Value Ref Range   Troponin I <0.03 <0.031 ng/mL     Comment:        NO INDICATION OF MYOCARDIAL INJURY.   Hemoglobin     Status: Abnormal   Collection Time: 01/10/16 10:41 PM  Result Value Ref Range   Hemoglobin 9.5 (L) 12.0 - 16.0 g/dL  Lipase, blood     Status: None   Collection Time: 01/10/16 10:41 PM  Result Value Ref Range   Lipase 13 11 - 51 U/L    Comment: HEMOLYSIS AT THIS LEVEL MAY AFFECT RESULT  Basic metabolic panel     Status: Abnormal   Collection Time: 01/11/16  5:37 AM  Result Value Ref Range   Sodium 135 135 - 145 mmol/L   Potassium 4.0 3.5 - 5.1 mmol/L   Chloride 96 (L) 101 - 111 mmol/L   CO2 33 (H) 22 - 32 mmol/L   Glucose, Bld 166 (H) 65 - 99 mg/dL   BUN 20 6 - 20 mg/dL   Creatinine, Ser 0.59 0.44 - 1.00 mg/dL   Calcium 8.2 (L) 8.9 - 10.3 mg/dL   GFR calc non Af Amer >60 >60 mL/min   GFR calc Af Amer >60 >60 mL/min    Comment: (NOTE) The eGFR has been calculated using the CKD EPI equation. This calculation has not been validated in all clinical situations. eGFR's persistently <60 mL/min signify possible Chronic Kidney Disease.    Anion gap 6 5 - 15  CBC     Status: Abnormal   Collection Time: 01/11/16  5:37 AM  Result Value Ref Range   WBC 5.1 3.6 - 11.0 K/uL   RBC 3.56 (L) 3.80 - 5.20 MIL/uL   Hemoglobin 9.2 (L) 12.0 - 16.0 g/dL   HCT 28.4 (L) 35.0 - 47.0 %   MCV 79.9 (L) 80.0 - 100.0 fL   MCH 26.0 26.0 - 34.0 pg   MCHC 32.5 32.0 - 36.0 g/dL   RDW 19.4 (H) 11.5 - 14.5 %   Platelets 319 150 - 440 K/uL  Hemoglobin     Status: Abnormal   Collection Time: 01/11/16  9:52 AM  Result Value Ref Range   Hemoglobin 9.2 (L) 12.0 - 16.0 g/dL  Hemoglobin     Status: Abnormal   Collection Time: 01/11/16  6:41 PM  Result Value Ref Range   Hemoglobin 9.1 (L) 12.0 - 16.0 g/dL  Hemoglobin A1c     Status: Abnormal   Collection Time: 01/11/16  6:41 PM  Result Value Ref Range   Hgb A1c MFr Bld 6.2 (H) 4.0 - 6.0 %  Hemoglobin     Status: Abnormal   Collection Time: 01/12/16  2:47 AM  Result Value Ref Range    Hemoglobin 8.8 (L) 12.0 - 16.0 g/dL  CBC     Status: Abnormal   Collection Time: 01/12/16  4:42 AM  Result Value Ref Range   WBC 4.7 3.6 - 11.0 K/uL   RBC 3.21 (L) 3.80 - 5.20 MIL/uL   Hemoglobin 8.4 (L) 12.0 - 16.0 g/dL   HCT 26.0 (L) 35.0 - 47.0 %   MCV 80.9 80.0 - 100.0 fL   MCH 26.2 26.0 - 34.0 pg   MCHC 32.4 32.0 - 36.0 g/dL   RDW 19.8 (H) 11.5 - 14.5 %   Platelets 310 150 - 440 K/uL  Basic metabolic panel     Status: Abnormal   Collection Time: 01/12/16  4:42 AM  Result Value Ref Range   Sodium 132 (L) 135 - 145 mmol/L   Potassium 4.3 3.5 - 5.1 mmol/L   Chloride 93 (L) 101 - 111 mmol/L   CO2 34 (H) 22 - 32 mmol/L   Glucose, Bld 203 (H) 65 - 99 mg/dL   BUN 24 (  H) 6 - 20 mg/dL   Creatinine, Ser 0.59 0.44 - 1.00 mg/dL   Calcium 8.2 (L) 8.9 - 10.3 mg/dL   GFR calc non Af Amer >60 >60 mL/min   GFR calc Af Amer >60 >60 mL/min    Comment: (NOTE) The eGFR has been calculated using the CKD EPI equation. This calculation has not been validated in all clinical situations. eGFR's persistently <60 mL/min signify possible Chronic Kidney Disease.    Anion gap 5 5 - 15  Troponin I (q 6hr x 3)     Status: None   Collection Time: 01/12/16  4:42 AM  Result Value Ref Range   Troponin I <0.03 <0.031 ng/mL    Comment:        NO INDICATION OF MYOCARDIAL INJURY.   Hemoglobin     Status: Abnormal   Collection Time: 01/12/16 11:03 AM  Result Value Ref Range   Hemoglobin 8.6 (L) 12.0 - 16.0 g/dL  Troponin I (q 6hr x 3)     Status: None   Collection Time: 01/12/16 11:06 AM  Result Value Ref Range   Troponin I <0.03 <0.031 ng/mL    Comment:        NO INDICATION OF MYOCARDIAL INJURY.   Troponin I (q 6hr x 3)     Status: None   Collection Time: 01/12/16  3:51 PM  Result Value Ref Range   Troponin I <0.03 <0.031 ng/mL    Comment:        NO INDICATION OF MYOCARDIAL INJURY.     Current Facility-Administered Medications  Medication Dose Route Frequency Provider Last Rate Last Dose   . 0.9 %  sodium chloride infusion  10 mL/hr Intravenous Once Harvest Dark, MD   Stopped at 01/10/16 1851  . albuterol (PROVENTIL) (2.5 MG/3ML) 0.083% nebulizer solution 2.5 mg  2.5 mg Nebulization Q6H PRN Loletha Grayer, MD   2.5 mg at 01/11/16 1818  . ALPRAZolam Duanne Moron) tablet 0.25 mg  0.25 mg Oral TID Laverle Hobby, MD   0.25 mg at 01/12/16 1550  . antiseptic oral rinse (CPC / CETYLPYRIDINIUM CHLORIDE 0.05%) solution 7 mL  7 mL Mouth Rinse q12n4p Loletha Grayer, MD   7 mL at 01/12/16 1552  . aspirin EC tablet 81 mg  81 mg Oral Q lunch Loletha Grayer, MD   81 mg at 01/12/16 1203  . budesonide (PULMICORT) nebulizer solution 0.25 mg  0.25 mg Nebulization BID Loletha Grayer, MD   0.25 mg at 01/12/16 0746  . bumetanide (BUMEX) injection 1 mg  1 mg Intravenous Once Loletha Grayer, MD   1 mg at 01/10/16 2000  . chlorhexidine (PERIDEX) 0.12 % solution 15 mL  15 mL Mouth Rinse QHS Loletha Grayer, MD   15 mL at 01/10/16 2200  . chlorhexidine (PERIDEX) 0.12 % solution 15 mL  15 mL Mouth Rinse BID Loletha Grayer, MD   15 mL at 01/12/16 1550  . citalopram (CELEXA) tablet 20 mg  20 mg Oral Q lunch Loletha Grayer, MD   20 mg at 01/12/16 1203  . diphenhydrAMINE (BENADRYL) capsule 25 mg  25 mg Oral QHS PRN Loletha Grayer, MD   25 mg at 01/12/16 0255  . doxazosin (CARDURA) tablet 4 mg  4 mg Oral QHS Loletha Grayer, MD   4 mg at 01/11/16 2111  . enoxaparin (LOVENOX) injection 40 mg  40 mg Subcutaneous Q24H Laverle Hobby, MD   40 mg at 01/12/16 1204  . ezetimibe (ZETIA) tablet 10 mg  10 mg Oral QHS  Loletha Grayer, MD   10 mg at 01/11/16 2111  . feeding supplement (ENSURE ENLIVE) (ENSURE ENLIVE) liquid 237 mL  237 mL Oral BID BM Vipul Shah, MD   237 mL at 01/12/16 1015  . fluticasone (FLONASE) 50 MCG/ACT nasal spray 1 spray  1 spray Each Nare BID Loletha Grayer, MD   1 spray at 01/12/16 [phone]  . gabapentin (NEURONTIN) capsule 100 mg  100 mg Oral BID Loletha Grayer, MD   100 mg at  01/12/16 0924  . hydrALAZINE (APRESOLINE) tablet 10 mg  10 mg Oral Q4H PRN Max Sane, MD   10 mg at 01/12/16 1603  . hydrALAZINE (APRESOLINE) tablet 25 mg  25 mg Oral 3 times per day Max Sane, MD      . HYDROcodone-acetaminophen (NORCO/VICODIN) 5-325 MG per tablet 1 tablet  1 tablet Oral Q4H PRN Loletha Grayer, MD   1 tablet at 01/12/16 0151  . methylPREDNISolone sodium succinate (SOLU-MEDROL) 40 mg/mL injection 40 mg  40 mg Intravenous Q12H Laverle Hobby, MD      . mupirocin ointment (BACTROBAN) 2 % 1 application  1 application Topical Daily PRN Loletha Grayer, MD      . nebivolol (BYSTOLIC) tablet 5 mg  5 mg Oral Daily Pavan Pyreddy, MD   5 mg at 01/12/16 0925  . nitroGLYCERIN (NITROSTAT) SL tablet 0.4 mg  0.4 mg Sublingual Q5 min PRN Loletha Grayer, MD   0.4 mg at 01/12/16 0556  . pantoprazole (PROTONIX) injection 40 mg  40 mg Intravenous Q12H Loletha Grayer, MD   40 mg at 01/12/16 0925  . polyvinyl alcohol (LIQUIFILM TEARS) 1.4 % ophthalmic solution 1 drop  1 drop Both Eyes BID Loletha Grayer, MD   1 drop at 01/12/16 0925  . promethazine (PHENERGAN) tablet 25 mg  25 mg Oral Q8H PRN Loletha Grayer, MD      . sodium chloride flush (NS) 0.9 % injection 3 mL  3 mL Intravenous Q12H Loletha Grayer, MD   3 mL at 01/12/16 0927  . spironolactone (ALDACTONE) tablet 25 mg  25 mg Oral Q M,W,F Loletha Grayer, MD   25 mg at 01/11/16 1349  . tiotropium (SPIRIVA) inhalation capsule 18 mcg  18 mcg Inhalation Daily Loletha Grayer, MD   18 mcg at 01/12/16 3545   Facility-Administered Medications Ordered in Other Encounters  Medication Dose Route Frequency Provider Last Rate Last Dose  . 0.9 %  sodium chloride infusion   Intravenous Once Lequita Asal, MD      . alteplase (CATHFLO ACTIVASE) injection 2 mg  2 mg Intracatheter Once PRN Lequita Asal, MD      . ferumoxytol (FERAHEME) 510 mg in sodium chloride 0.9 % 100 mL IVPB  510 mg Intravenous Once Lequita Asal, MD      .  heparin lock flush 100 unit/mL  500 Units Intracatheter Once PRN Lequita Asal, MD      . heparin lock flush 100 unit/mL  250 Units Intracatheter Once PRN Lequita Asal, MD      . sodium chloride 0.9 % injection 10 mL  10 mL Intracatheter PRN Lequita Asal, MD      . sodium chloride 0.9 % injection 3 mL  3 mL Intravenous Once PRN Lequita Asal, MD        Musculoskeletal: Strength & Muscle Tone: decreased Gait & Station: unable to stand Patient leans: Backward  Psychiatric Specialty Exam: Review of Systems  Constitutional: Positive for malaise/fatigue.  HENT: Negative.  Eyes: Negative.   Respiratory: Positive for shortness of breath.   Cardiovascular: Positive for chest pain.  Gastrointestinal: Negative.   Musculoskeletal: Negative.   Skin: Negative.   Neurological: Positive for weakness.  Psychiatric/Behavioral: Positive for depression. Negative for suicidal ideas, hallucinations, memory loss and substance abuse. The patient is not nervous/anxious and does not have insomnia.     Blood pressure 160/70, pulse 78, temperature 97.8 F (36.6 C), temperature source Oral, resp. rate 18, height 5' (1.524 m), weight 74.5 kg (164 lb 3.9 oz), SpO2 98 %.Body mass index is 32.08 kg/(m^2).  General Appearance: Fairly Groomed  Engineer, water::  Minimal  Speech:  Slow  Volume:  Normal  Mood:  Dysphoric  Affect:  Appropriate  Thought Process:  Goal Directed  Orientation:  Full (Time, Place, and Person)  Thought Content:  Negative  Suicidal Thoughts:  No  Homicidal Thoughts:  No  Memory:  Immediate;   Good Recent;   Fair Remote;   Fair  Judgement:  Good  Insight:  Fair  Psychomotor Activity:  Decreased  Concentration:  Fair  Recall:  [employer] of Knowledge:Fair  Language: Fair  Akathisia:  No  Handed:  Right  AIMS (if indicated):     Assets:  Communication Skills Desire for Improvement Financial Resources/Insurance Housing Resilience Social Support  ADL's:   Impaired  Cognition: Impaired,  Mild  Sleep:      Treatment Plan Summary: Daily contact with patient to assess and evaluate symptoms and progress in treatment, Medication management and Plan 82 year old woman. Some symptoms of depression but does not have a very severe or necessarily full-blown major depression. She is currently taking antidepressant medicines citalopram and also antianxiety medicine Xanax. Those were listed as being already prescribed when she came into the hospital. When I went to talk with her I had not been aware of that and told her that I might consider starting antidepressant but since she is already on one I am not going to adjust anything. Given what she is been through I think that she appears to largely be dealing with things pretty well. Me that her daughter has tried to talk with her about considering going to a grief group and although she has not been willing to do it yet she is thinking about it which is actually pretty good. No change to medicine for now. Supportive counseling with the patient. Review of medication. I will come by and check in tomorrow as well.  Disposition: Patient does not meet criteria for psychiatric inpatient admission. Supportive therapy provided about ongoing stressors.  Alethia Berthold, MD 01/12/2016 6:33 PM

## 2016-01-12 NOTE — Progress Notes (Signed)
Dr. Sherryll BurgerShah rounded on patient and asked why pt has not moved out of ICU yet. Dr. Sherryll BurgerShah informed of pts elevated SBP. He stated he was aware and that pt would have elevated SBP due to her anxiety but to give PRNs as ordered and transfer pt to the floor.

## 2016-01-12 NOTE — Progress Notes (Signed)
Initial Nutrition Assessment   INTERVENTION:   Meals and Snacks: Cater to patient preferences Medical Food Supplement Therapy: will recommend Ensure Enlive po BID, each supplement provides 350 kcal and 20 grams of protein. Pt may benefit from Glucerna if intake adequate and blood sugars are remaining elevated. Coordination of Care: if pt at risk for aspiration, recommend SLP evaulation.   NUTRITION DIAGNOSIS:   Inadequate oral intake related to poor appetite as evidenced by per patient/family report.  GOAL:   Patient will meet greater than or equal to 90% of their needs  MONITOR:   PO intake, Supplement acceptance, Labs, Weight trends, I & O's  REASON FOR ASSESSMENT:   Consult Assessment of nutrition requirement/status  ASSESSMENT:    Pt admitted with difficulty breathing and acute respiratory failure with COPD exacerbation and anemia. Per MD note pt with guaiac positive stool.   Past Medical History  Diagnosis Date  . Essential hypertension   . Diabetes mellitus type II, controlled (HCC)   . COPD (chronic obstructive pulmonary disease) (HCC)   . Osteoporosis   . Carotid arterial disease (HCC)     a. 2012 Carotid dopplers: 40-59% R ICA, 60-79% L ICA  . Tic [place]ou[place]   . Arthralgia   . Sjoegren syndrome (HCC)   . TIA (transient ischemic attack)   . Emphysema   . MVP (mitral valve prolapse)   . Chronic diastolic CHF (congestive heart failure) (HCC)     a. 08/2011 Echo: EF 65-70%, mild LVH, mod dil LA, mildly dil RA, mild MR, mild-mod AoV Sclerosis w/o stenosis, mod-sev elev PA pressures, mild TR.  Marland Kitchen. Acid reflux   . Diverticulitis   . Anemia   . History of pneumonia   . Syncope and collapse   . Anemia   . Hip fracture, left (HCC)   . Hyperlipidemia   . Hiatal hernia   . Chest pain     a. 08/2011 MV: EF 74%, no ischemia.  . Noncompliance     a. h/o not taking bumex as Rx.  Marland Kitchen. PAF (paroxysmal atrial fibrillation) (HCC)     a. CHA2DS2VASc = 8 ->No OAC 2/2 falls.     Diet Order:  Diet Heart Room service appropriate?: Yes; Fluid consistency:: Thin    Current Nutrition: Pt reports eating eggs with cheese this am and ate a very good amount. 80% of breakfast consumed per documentation and 75% of dinner last night.   Food/Nutrition-Related History: Pt reports having not eaten in 3 days since admission as she has been sleeping. Pt reports poor appetite PTA but difficult to clarify what pt has been eating. Pt reports having to eat soft foods because of her 'esophagus.' Pt reports usually eating soft vegetables. Pt reports no dysphagia this am with breakfast.   Scheduled Medications:  . sodium chloride  10 mL/hr Intravenous Once  . ALPRAZolam  0.25 mg Oral TID  . antiseptic oral rinse  7 mL Mouth Rinse q12n4p  . aspirin EC  81 mg Oral Q lunch  . budesonide (PULMICORT) nebulizer solution  0.25 mg Nebulization BID  . bumetanide (BUMEX) IV  1 mg Intravenous Once  . chlorhexidine  15 mL Mouth Rinse QHS  . chlorhexidine  15 mL Mouth Rinse BID  . citalopram  20 mg Oral Q lunch  . doxazosin  4 mg Oral QHS  . enoxaparin (LOVENOX) injection  40 mg Subcutaneous Q24H  . ezetimibe  10 mg Oral QHS  . feeding supplement (ENSURE ENLIVE)  237 mL Oral BID  BM  . fluticasone  1 spray Each Nare BID  . gabapentin  100 mg Oral BID  . methylPREDNISolone (SOLU-MEDROL) injection  40 mg Intravenous Q12H  . nebivolol  5 mg Oral Daily  . pantoprazole (PROTONIX) IV  40 mg Intravenous Q12H  . polyvinyl alcohol  1 drop Both Eyes BID  . sodium chloride flush  3 mL Intravenous Q12H  . spironolactone  25 mg Oral Q M,W,F  . tiotropium  18 mcg Inhalation Daily     Electrolyte/Renal Profile and Glucose Profile:   Recent Labs Lab 01/10/16 1526 01/11/16 0537 01/12/16 0442  NA 133* 135 132*  K 3.7 4.0 4.3  CL 95* 96* 93*  CO2 32 33* 34*  BUN 21* 20 24*  CREATININE 0.67 0.59 0.59  CALCIUM 8.2* 8.2* 8.2*  GLUCOSE 150* 166* 203*   Protein Profile:  Recent Labs Lab  01/10/16 1526  ALBUMIN 3.0*    Gastrointestinal Profile: Last BM:  01/10/2016   Nutrition-Focused Physical Exam Findings: Nutrition-Focused physical exam completed. Findings are WDL for fat depletion, muscle depletion, and edema.    Weight Change: Pt reports usual body weight of 157lbs. PTA   Height:   Ht Readings from Last 1 Encounters:  01/10/16 5' (1.524 m)    Weight:   Wt Readings from Last 1 Encounters:  01/10/16 164 lb 3.9 oz (74.5 kg)   Wt Readings from Last 10 Encounters:  01/10/16 164 lb 3.9 oz (74.5 kg)  12/01/15 152 lb (68.947 kg)  11/21/15 157 lb 6.4 oz (71.396 kg)  11/10/15 162 lb (73.483 kg)  10/03/15 160 lb 11.5 oz (72.9 kg)  09/29/15 165 lb 8 oz (75.07 kg)  09/05/15 160 lb 6 oz (72.746 kg)  09/02/15 144 lb 6.4 oz (65.5 kg)  08/19/15 170 lb (77.111 kg)  07/29/15 160 lb 6.4 oz (72.757 kg)     BMI:  Body mass index is 32.08 kg/(m^2).  Estimated Nutritional Needs:   Kcal:  BEE: 822kcals, TEE: (IF 1.1-1.3)(AF 1.3) 1175-1388kcals, using IBW of 45.5kg  Protein:  45-54g protein (1.0-1.2g/kg)  Fluid:  1138-1332mL of fluid (25-36mL/kg)   MODERATE Care Level  Leda Quail, RD, LDN Pager [phone] Weekend/On-Call Pager [phone]

## 2016-01-12 NOTE — Progress Notes (Signed)
Pt started complaining of chest pain. PRN sublingual Nitro given and MD notified. Orders for STAT 12 lead EKG and troponin given. Pt had temporary relief from first tablet. She started to have chest pain again and was given 2 more sublingual Nitro tablets per PRN order. Pt then had total relief. Pt was also complaining of being restless. MD updated on Pt's condition. New orders given to address Pt's anxiety. Will continue to monitor.

## 2016-01-12 NOTE — Progress Notes (Signed)
[facility] [place] at [facility]   PATIENT NAME: Catherine Holmes    MR#:  [id number]  DATE OF BIRTH:  1928-08-04  SUBJECTIVE:  CHIEF COMPLAINT:   Chief Complaint  Patient presents with  . Respiratory Distress  doing better, off BiPAP, on N.C., BP high. REVIEW OF SYSTEMS:  Review of Systems  Constitutional: Negative for fever, weight loss, malaise/fatigue and diaphoresis.  HENT: Negative for ear discharge, ear pain, hearing loss, nosebleeds, sore throat and tinnitus.   Eyes: Negative for blurred vision and pain.  Respiratory: Positive for cough and shortness of breath. Negative for hemoptysis and wheezing.   Cardiovascular: Negative for chest pain, palpitations, orthopnea and leg swelling.  Gastrointestinal: Negative for heartburn, nausea, vomiting, abdominal pain, diarrhea, constipation and blood in stool.  Genitourinary: Negative for dysuria, urgency and frequency.  Musculoskeletal: Negative for myalgias and back pain.  Skin: Negative for itching and rash.  Neurological: Negative for dizziness, tingling, tremors, focal weakness, seizures, weakness and headaches.  Psychiatric/Behavioral: Negative for depression. The patient is not nervous/anxious.    DRUG ALLERGIES:   Allergies  Allergen Reactions  . Singlet [Chlorphen-Pe-Acetaminophen] Shortness Of Breath  . Atorvastatin Other (See Comments)    Reaction:  Unknown   . Clindamycin Diarrhea and Nausea And Vomiting  . Codeine Other (See Comments)    Reaction:  Unknown   . Doxycycline Hives and Itching  . Epinephrine Hives and Itching  . Erythromycin Hives and Itching  . Fluticasone-Salmeterol Other (See Comments)    Reaction:  Unknown   . Lasix [Furosemide] Itching  . Levofloxacin Other (See Comments)    Reaction:  Unknown   . Penicillins Other (See Comments)    Reaction:  Unknown   . Propoxyphene Hcl Other (See Comments)    Reaction:  Unknown   . Rosuvastatin Other (See Comments)    Reaction:   Unknown   . Simvastatin Other (See Comments)    Reaction:  Unknown   . Sulfamethoxazole-Trimethoprim Other (See Comments)    Reaction:  Unknown   . Teriparatide Other (See Comments)    Reaction:  Unknown   . Tetracyclines & Related Hives and Itching  . Ceclor [Cefaclor] Hives and Other (See Comments)    Reaction:  Altered mental status    . Glipizide Hives and Itching  . Lisinopril Hives and Itching   VITALS:  Blood pressure 160/70, pulse 78, temperature 97.8 F (36.6 C), temperature source Oral, resp. rate 18, height 5' (1.524 m), weight 74.5 kg (164 lb 3.9 oz), SpO2 98 %. PHYSICAL EXAMINATION:  Physical Exam  Constitutional: She is oriented to person, place, and time and well-developed, well-nourished, and in no distress.  HENT:  Head: Normocephalic and atraumatic.  Eyes: Conjunctivae and EOM are normal. Pupils are equal, round, and reactive to light.  Neck: Normal range of motion. Neck supple. No tracheal deviation present. No thyromegaly present.  Cardiovascular: Normal rate, regular rhythm and normal heart sounds.   Pulmonary/Chest: She is in respiratory distress. She has decreased breath sounds. She has no wheezes. She exhibits no tenderness.  Abdominal: Soft. Bowel sounds are normal. She exhibits no distension. There is no tenderness.  Musculoskeletal: Normal range of motion.  Neurological: She is alert and oriented to person, place, and time. No cranial nerve deficit.  Skin: Skin is warm and dry. No rash noted.  Psychiatric: Mood and affect normal.   LABORATORY PANEL:   CBC  Recent Labs Lab 01/12/16 0442 01/12/16 1103  WBC 4.7  --  HGB 8.4* 8.6*  HCT 26.0*  --   PLT 310  --    ------------------------------------------------------------------------------------------------------------------ Chemistries   Recent Labs Lab 01/10/16 1526  01/12/16 0442  NA 133*  < > 132*  K 3.7  < > 4.3  CL 95*  < > 93*  CO2 32  < > 34*  GLUCOSE 150*  < > 203*  BUN 21*  <  > 24*  CREATININE 0.67  < > 0.59  CALCIUM 8.2*  < > 8.2*  AST 19  --   --   ALT 21  --   --   ALKPHOS 67  --   --   BILITOT 0.4  --   --   < > = values in this interval not displayed. RADIOLOGY:  No results found. ASSESSMENT AND PLAN:  1. Acute respiratory failure with hypoxia. Patient currently off BiPAP, on N.C. 2. COPD exacerbation: continue IV Solu-Medrol. Continue nebulizers, add budesonide nebulizers. Continue Spiriva. 3. Anemia: Hb 7.2 on admission ->8.6 today. continue Protonix 40 mg IV twice a day. S/P Transfusion of 1 unit of packed red blood cells. Bumex IV prior and after transfusion. 4. Chronic diastolic congestive heart failure. Bumex IV prior and after blood 5. Relative hypotension: o admission resolved. BP high now. Will add hydralazine scheduled and PRN 6. Impaired fasting glucose:. on sliding scale     All the records are reviewed and case discussed with Care Management/Social Worker. Management plans discussed with the patient, family and they are in agreement.  CODE STATUS: FULL CODE  TOTAL TIME TAKING CARE OF THIS PATIENT: 35 minutes.   More than 50% of the time was spent in counseling/coordination of care: YES  Can transfer to floors  POSSIBLE D/C IN 1-2 DAYS, DEPENDING ON CLINICAL CONDITION.   [facility], Searra Carnathan M.D on 01/12/2016 at 6:17 PM  Between 7am to 6pm - Pager - [phone]  After 6pm go to [email or web address] - password [email or web address] [email or web address]  [facility]  Office  [phone]  CC: Primary care physician; Marisue IvanLINTHAVONG, KANHKA, MD  Note: This dictation was prepared with Dragon dictation along with smaller phrase technology. Any transcriptional errors that result from this process are unintentional.

## 2016-01-12 NOTE — Progress Notes (Signed)
[facility] Critical Care Medicine Consultation     ASSESSMENT/PLAN   82 year old female with severe COPD, COPD exacerbation, chronic right lower lobe atelectasis with intrathoracic stomach.  PULMONARY  A:Acute respiratory failure, secondary to acute COPD exacerbation and acute bronchitis. -Chronic hypercapnic respiratory failure, with hypoventilation, likely due to severe emphysema. -Chronic right lower lobe atelectasis due to intrathoracic stomach/hiatal hernia with right lung volume loss-likely contributing to hypoventilation. P:   -Patient appears improved today, will decrease steroid dose, Will decrease BiPAP to daily at bedtime. -Continue Spiriva. Continue nebulizers.   CARDIOVASCULAR  A: Chronic diastolic CHF. -Essential hypertension, BP better controlled today. P:  -Continue spironolactone, Bystolic, Cardura for blood pressure. -We'll add when necessary medications for blood pressure control as needed.  RENAL A:  --  GASTROINTESTINAL A:  -Large hiatal hernia with intrathoracic stomach, leading to chronic right lower lobe atelectasis. -Continue Protonix.  HEMATOLOGIC A:  -  INFECTIOUS A: Acute bronchitis, leading to acute exacerbation of COPD with respiratory failure. Suspect a viral etiology at this time.  BCx2 3/21: Pending UC -- Sputum pending Abx: -- MRSA screen negative. 3/21. Influenza screen negative. 3/21.  ENDOCRINE A: Will monitor blood sugars, and start sliding scale if indicated.  NEUROLOGIC A:  Acute metabolic encephalopathy, likely due to hypercapnic respiratory failure. This appears improved. -Depression. Our notes that the patient sometimes talks to a son who was passed away. [facility] consult psychology/psychiatry. ---------------------------------------  ---------------------------------------   Name: Leniya Breit Basquez MRN: [id number] DOB: 03-27-1928    ADMISSION DATE:  01/10/2016 CONSULTATION DATE:  01/11/16  REFERRING MD :  Dr.  Hilton Sinclair.   CHIEF COMPLAINT:  Dyspnea.    HISTORY OF PRESENT ILLNESS:    Currently, the patient has lethargic, therefore, all history was obtained from the chart, and from staff.  The patient is a 82 year old female with history and presentation as per the ER note below. She arrived to the hospital respiratory distress, thought to be secondary to acute bronchitis with COPD exacerbation. Currently on initial evaluation, she was noted to be somewhat cyanotic, and lethargic. She was given 2 nebulization treatments and end. Upon arrival to the emergency room was doing somewhat better, she was then started on BiPAP. Previous blood gas on 11/15/2015 showed a pH of 7.48/61/77/45.4, consistent with the chronic hypercapnic respiratory failure. CO2, and her chemistry panel was elevated at 33. Renal function was normal. Her troponin was negative, influenza and MRSA screen were negative. She has a history of COPD, apparently she has seen Dr. Meredeth Ide in the past. She is on 2 L of home oxygen at home.  Chest x-ray images from this admission and reports reviewed and per with previous images, as well as most recent CT of the chest. These are consistent with large hiatal hernia/intrathoracic stomach, with chronic right lower lobe atelectasis that appears to be worse on this most recent chest x-ray.  Per ED:  Merrie Epler Shillingford is a 82 y.o. female with a past medical history of hypertension, diabetes, COPD, CHF, hyperlipidemia, paroxysmal atrial fibrillation, presents the emergency department with difficulty breathing. According to EMS the patient was driven by her daughter to the EMS station. EMS states the patient was cyanotic appearing with very diminished breath sounds, largely unresponsive. They gave the patient to do an abscess, and the patient became much more responsive, asking for "the mask." They placed the patient on CPAP, but were only getting oxygen saturations in the upper 80s to around 90, and transported  the patient to the emergency department. Upon  arrival to the emergency department patient is very somnolent but awakens easily, transitioned to BiPAP.   PAST MEDICAL HISTORY :  Past Medical History  Diagnosis Date  . Essential hypertension   . Diabetes mellitus type II, controlled (HCC)   . COPD (chronic obstructive pulmonary disease) (HCC)   . Osteoporosis   . Carotid arterial disease (HCC)     a. 2012 Carotid dopplers: 40-59% R ICA, 60-79% L ICA  . Tic douloureux   . Arthralgia   . Sjoegren syndrome (HCC)   . TIA (transient ischemic attack)   . Emphysema   . MVP (mitral valve prolapse)   . Chronic diastolic CHF (congestive heart failure) (HCC)     a. 08/2011 Echo: EF 65-70%, mild LVH, mod dil LA, mildly dil RA, mild MR, mild-mod AoV Sclerosis w/o stenosis, mod-sev elev PA pressures, mild TR.  Marland Kitchen. Acid reflux   . Diverticulitis   . Anemia   . History of pneumonia   . Syncope and collapse   . Anemia   . Hip fracture, left (HCC)   . Hyperlipidemia   . Hiatal hernia   . Chest pain     a. 08/2011 MV: EF 74%, no ischemia.  . Noncompliance     a. h/o not taking bumex as Rx.  Marland Kitchen. PAF (paroxysmal atrial fibrillation) (HCC)     a. CHA2DS2VASc = 8 ->No OAC 2/2 falls.   Past Surgical History  Procedure Laterality Date  . Cholecystectomy  1965  . Abdominal hysterectomy  1964  . Cataract extraction Bilateral   . Hip fracture surgery  2014    left hip   Prior to Admission medications   Medication Sig Start Date End Date Taking? Authorizing Provider  albuterol (PROVENTIL) (2.5 MG/3ML) 0.083% nebulizer solution Take 2.5 mg by nebulization every 6 (six) hours as needed for wheezing or shortness of breath.    Yes Historical Provider, MD  ALPRAZolam (XANAX) 0.25 MG tablet Take 1 tablet (0.25 mg total) by mouth 3 (three) times daily as needed for anxiety. 11/21/15  Yes Srikar Sudini, MD  aspirin EC 81 MG tablet Take 81 mg by mouth daily with lunch.    Yes Historical Provider, MD  bumetanide  (BUMEX) 1 MG tablet Take 1-2 mg by mouth 2 (two) times daily as needed (for edema).   Yes Historical Provider, MD  calcium carbonate (OSCAL) 1500 (600 Ca) MG TABS tablet Take 600 mg of elemental calcium by mouth daily with breakfast.   Yes Historical Provider, MD  chlorhexidine (PERIDEX) 0.12 % solution 15 mLs by Mouth Rinse route at bedtime.    Yes Historical Provider, MD  citalopram (CELEXA) 20 MG tablet Take 20 mg by mouth daily with lunch.    Yes Historical Provider, MD  diphenhydramine-acetaminophen (TYLENOL PM) 25-500 MG TABS tablet Take 1 tablet by mouth at bedtime as needed (for sleep).   Yes Historical Provider, MD  doxazosin (CARDURA) 4 MG tablet Take 4 mg by mouth at bedtime.    Yes Antonieta Ibaimothy J Gollan, MD  ezetimibe (ZETIA) 10 MG tablet Take 10 mg by mouth at bedtime.    Yes Historical Provider, MD  fluticasone (FLONASE) 50 MCG/ACT nasal spray Place 1 spray into both nostrils 2 (two) times daily.    Yes Historical Provider, MD  gabapentin (NEURONTIN) 100 MG capsule Take 100 mg by mouth 2 (two) times daily.    Yes Historical Provider, MD  guaiFENesin-dextromethorphan (ROBITUSSIN DM) 100-10 MG/5ML syrup Take 5 mLs by mouth every 4 (four) hours as  needed for cough. 11/21/15  Yes Srikar Sudini, MD  HYDROcodone-acetaminophen (NORCO/VICODIN) 5-325 MG tablet Take 1 tablet by mouth every 4 (four) hours as needed for moderate pain. 11/21/15  Yes Srikar Sudini, MD  mometasone-formoterol (DULERA) 100-5 MCG/ACT AERO Inhale 2 puffs into the lungs 2 (two) times daily.   Yes Historical Provider, MD  mupirocin ointment (BACTROBAN) 2 % Apply 1 application topically daily as needed (for sores on leg).    Yes Historical Provider, MD  nebivolol (BYSTOLIC) 5 MG tablet Take 1 tablet (5 mg total) by mouth daily. 04/03/15  Yes Adrian Saran, MD  nitroGLYCERIN (NITROSTAT) 0.4 MG SL tablet Place 0.4 mg under the tongue every 5 (five) minutes as needed for chest pain.   Yes Historical Provider, MD  Polyethyl Glycol-Propyl  Glycol (SYSTANE OP) Place 1 drop into both eyes 2 (two) times daily.   Yes Historical Provider, MD  promethazine (PHENERGAN) 25 MG tablet Take 25 mg by mouth every 8 (eight) hours as needed for nausea or vomiting.    Yes Historical Provider, MD  SPIRIVA RESPIMAT 1.25 MCG/ACT AERS Inhale 1 puff into the lungs daily.   Yes Historical Provider, MD  spironolactone (ALDACTONE) 25 MG tablet Take 25 mg by mouth every Monday, Wednesday, and Friday.   Yes Historical Provider, MD   Allergies  Allergen Reactions  . Singlet [Chlorphen-Pe-Acetaminophen] Shortness Of Breath  . Atorvastatin Other (See Comments)    Reaction:  Unknown   . Clindamycin Diarrhea and Nausea And Vomiting  . Codeine Other (See Comments)    Reaction:  Unknown   . Doxycycline Hives and Itching  . Epinephrine Hives and Itching  . Erythromycin Hives and Itching  . Fluticasone-Salmeterol Other (See Comments)    Reaction:  Unknown   . Lasix [Furosemide] Itching  . Levofloxacin Other (See Comments)    Reaction:  Unknown   . Penicillins Other (See Comments)    Reaction:  Unknown   . Propoxyphene Hcl Other (See Comments)    Reaction:  Unknown   . Rosuvastatin Other (See Comments)    Reaction:  Unknown   . Simvastatin Other (See Comments)    Reaction:  Unknown   . Sulfamethoxazole-Trimethoprim Other (See Comments)    Reaction:  Unknown   . Teriparatide Other (See Comments)    Reaction:  Unknown   . Tetracyclines & Related Hives and Itching  . Ceclor [Cefaclor] Hives and Other (See Comments)    Reaction:  Altered mental status    . Glipizide Hives and Itching  . Lisinopril Hives and Itching    FAMILY HISTORY:  Family History  Problem Relation Age of Onset  . Heart attack Mother   . Heart attack Father   . Heart attack Sister    SOCIAL HISTORY:  reports that she quit smoking about 21 years ago. Her smoking use included Cigarettes. She has a 15 pack-year smoking history. She has never used smokeless tobacco. She reports  that she drinks alcohol. She reports that she does not use illicit drugs.  REVIEW OF SYSTEMS:   Could not be obtained as patient is currently on BiPAP.   VITAL SIGNS: Temp:  [97.3 F (36.3 C)-98.1 F (36.7 C)] 98.1 F (36.7 C) (03/23 0800) Pulse Rate:  [59-94] 75 (03/23 1000) Resp:  [15-37] 19 (03/23 1000) BP: (125-181)/(43-112) 150/61 mmHg (03/23 1000) SpO2:  [91 %-100 %] 99 % (03/23 1000) FiO2 (%):  [35 %] 35 % (03/22 1924) HEMODYNAMICS:   VENTILATOR SETTINGS: Vent Mode:  [-]  FiO2 (%):  [  35 %] 35 % INTAKE / OUTPUT:  Intake/Output Summary (Last 24 hours) at 01/12/16 1104 Last data filed at 01/12/16 0954  Gross per 24 hour  Intake    492 ml  Output    400 ml  Net     92 ml    Physical Examination:   VS: BP 150/61 mmHg  Pulse 75  Temp(Src) 98.1 F (36.7 C) (Oral)  Resp 19  Ht 5' (1.524 m)  Wt 164 lb 3.9 oz (74.5 kg)  BMI 32.08 kg/m2  SpO2 99%  General Appearance: No distress  Neuro:without focal findings HEENT: PERRLA, EOM intact, Pulmonary: Decreased breath sounds bilaterally. CardiovascularNormal S1,S2.  No m/r/g.    Abdomen: Benign, Soft, non-tender,  Renal:  No costovertebral tenderness  GU:  Not performed at this time. Endoc: No evident thyromegaly, no signs of acromegaly. Skin:   warm, skin appears dry Extremities: normal, no cyanosis, clubbing, no edema, warm with normal capillary refill.    LABS: Reviewed   LABORATORY PANEL:   CBC  Recent Labs Lab 01/12/16 0442  WBC 4.7  HGB 8.4*  HCT 26.0*  PLT 310    Chemistries   Recent Labs Lab 01/10/16 1526  01/12/16 0442  NA 133*  < > 132*  K 3.7  < > 4.3  CL 95*  < > 93*  CO2 32  < > 34*  GLUCOSE 150*  < > 203*  BUN 21*  < > 24*  CREATININE 0.67  < > 0.59  CALCIUM 8.2*  < > 8.2*  AST 19  --   --   ALT 21  --   --   ALKPHOS 67  --   --   BILITOT 0.4  --   --   < > = values in this interval not displayed.   Recent Labs Lab 01/10/16 2157  GLUCAP 191*   No results for  input(s): PHART, PCO2ART, PO2ART in the last 168 hours.  Recent Labs Lab 01/10/16 1526  AST 19  ALT 21  ALKPHOS 67  BILITOT 0.4  ALBUMIN 3.0*    Cardiac Enzymes  Recent Labs Lab 01/12/16 0442  TROPONINI <0.03    RADIOLOGY:  Dg Chest Portable 1 View  01/10/2016  CLINICAL DATA:  Unresponsive EXAM: PORTABLE CHEST 1 VIEW COMPARISON:  12/26/2005 FINDINGS: Stable enlarged cardiac silhouette. Large hiatal hernia again noted. Decrease in lung volumes compared to prior. Mild interstitial edema pattern noted. No focal infiltrate. No pneumothorax. No overt pulmonary edema. IMPRESSION: 1. Decrease in lung volumes compared to prior. 2. Mild interstitial edema pattern. 3. Large hiatal hernia and cardiomegaly. Electronically Signed   By: Genevive Bi M.D.   On: 01/10/2016 15:42       --Wells Guiles, MD.  Board Certified in Internal Medicine, Pulmonary Medicine, Critical Care Medicine, and Sleep Medicine.  [facility]   Santiago Glad, M.D.  Stephanie Acre, M.D.  Billy Fischer, M.D   01/12/2016, 11:04 AM   Critical Care Attestation.  I have personally obtained a history, examined the patient, evaluated laboratory and imaging results, formulated the assessment and plan and placed orders. The Patient requires high complexity decision making for assessment and support, frequent evaluation and titration of therapies, application of advanced monitoring technologies and extensive interpretation of multiple databases. The patient has critical illness that could lead imminently to failure of 1 or more organ systems and requires the highest level of physician preparedness to intervene.  Critical Care Time devoted to patient care services described  in this note is 35 minutes and is exclusive of time spent in procedures.

## 2016-01-13 ENCOUNTER — Inpatient Hospital Stay: Payer: Medicare Other

## 2016-01-13 LAB — BASIC METABOLIC PANEL
ANION GAP: 0 — AB (ref 5–15)
BUN: 25 mg/dL — ABNORMAL HIGH (ref 6–20)
CALCIUM: 8.3 mg/dL — AB (ref 8.9–10.3)
CO2: 38 mmol/L — AB (ref 22–32)
Chloride: 95 mmol/L — ABNORMAL LOW (ref 101–111)
Creatinine, Ser: 0.57 mg/dL (ref 0.44–1.00)
GLUCOSE: 187 mg/dL — AB (ref 65–99)
POTASSIUM: 4.4 mmol/L (ref 3.5–5.1)
Sodium: 133 mmol/L — ABNORMAL LOW (ref 135–145)

## 2016-01-13 MED ORDER — BUMETANIDE 1 MG PO TABS
1.0000 mg | ORAL_TABLET | Freq: Every day | ORAL | Status: DC
  Administered 2016-01-13 – 2016-01-16 (×4): 1 mg via ORAL
  Filled 2016-01-13 (×6): qty 1

## 2016-01-13 MED ORDER — CITALOPRAM HYDROBROMIDE 20 MG PO TABS: 30.0000 mg | ORAL_TABLET | Freq: Every day | ORAL | Status: DC

## 2016-01-13 NOTE — Consult Note (Signed)
[facility] Face-to-Face Psychiatry Consult   Reason for Consult:  Consult for this 82 year old woman currently in the hospital for respiratory failure who has multiple chronic medical problems. Concern about depression Referring Physician:  Manuella Ghazi Patient Identification: Catherine Holmes MRN:  [id number] Principal Diagnosis: Major depression mild single Diagnosis:   Patient Active Problem List   Diagnosis Date Noted  . Depression, major, single episode, mild ([place]) [F32.0] 01/12/2016  . Acute respiratory failure ([place]) [J96.00] 01/10/2016  . Pressure ulcer [L89.90] 11/14/2015  . COPD exacerbation ([place]) [J44.1] 11/13/2015  . Acute on chronic respiratory failure ([place]) [J96.20] 11/13/2015  . Anxiety and depression [F41.8] 11/10/2015  . Iron deficiency anemia [D50.9] 10/28/2015  . Musculoskeletal chest pain [R07.89] 07/29/2015  . Sternal fracture [S22.20XA] 07/29/2015  . Pneumonia [J18.9] 07/29/2015  . Acute on chronic respiratory failure with hypoxia and hypercapnia (HCC) [B34.19, J96.22] 07/28/2015  . Hyponatremia [E87.1] 04/11/2015  . Syncope [R55] 04/10/2015  . Absolute anemia [D64.9]   . Acute on chronic diastolic CHF (congestive heart failure) ([place]) [I50.33]   . CHF (congestive heart failure) ([place]) [I50.9] 03/31/2015  . Essential hypertension [I10]   . Diabetes mellitus type II, controlled ([place]) [E11.9]   . Hyposmolality and/or hyponatremia [E87.1] 12/04/2013  . Anemia [D64.9] 12/04/2013  . Chronic diastolic CHF (congestive heart failure) ([place]) [I50.32] 04/05/2013  . Positive fecal occult blood test [R19.5] 04/05/2013  . Fall [W19.XXXA] 11/20/2012  . Chest tightness [R07.89] 07/13/2011  . Diabetes type 2, uncontrolled ([place]) [E11.65] 11/29/2009  . Hyperlipidemia [E78.5] 11/29/2009  . TIC DOULOUREUX [G50.0] 11/29/2009  . HYPERTENSION, BENIGN [I10] 11/29/2009  . Mitral valve disorder [I05.9] 11/29/2009  . Congestive heart failure ([place]) [I50.9] 11/29/2009  . Carotid arterial disease ([place])  [I77.9] 11/29/2009  . TIA [G45.9] 11/29/2009  . PAD (peripheral artery disease) ([place]) [I73.9] 11/29/2009  . EMPHYSEMA [J43.8] 11/29/2009  . COPD (chronic obstructive pulmonary disease) ([place]) [J44.9] 11/29/2009  . GERD [K21.9] 11/29/2009  . SJOGREN'S SYNDROME [M35.00] 11/29/2009  . ARTHRALGIA [M25.50] 11/29/2009  . OSTEOPOROSIS [M81.0] 11/29/2009  . DYSPNEA [R06.02] 11/29/2009    Total Time spent with patient: 1 hour  Subjective:   Catherine Holmes is a 82 y.o. female patient admitted with "I'm not too well".  Follow-up Friday the 24th. Patient interviewed again. Daughter was also present. Patient once again tells me she is not feeling good. She seemed if anything perhaps a little more irritable today. No suicidal thoughts but continues to feel very down. Daughter emphasized chronic anxiety and irritability as being a problem.  HPI:  Patient interviewed. Chart reviewed. Labs reviewed medications reviewed. 82 year old woman currently in the hospital with respiratory failure. Multiple medical problems. Concern raised about depression particularly in the context of the death of her son. Patient's son died at the end of December 3790 by self-inflicted gunshot wound. Patient says that she has been feeling bad ever since September however. In September she had some kind of medical incident in which she was administered CPR briefly, whether properly or not, and apparently suffered a broken sternum. Ever since that time she has had pain and has felt like her respiratory problems and all of her health have been worse. On top of that of course her son killed himself at the end of December 2016. Patient tells me that she cannot accept it and she refuses to think about it because she gets too overwhelmed with emotion when she thinks about it. That said, she actually is able to talk about why to bit of it spontaneously.  She says that she gets down quite a bit at times but doesn't necessarily feel bad all the  time. She has some trouble sleeping a lot of which she blames on her medical problems. Her appetite appears to be normal. Patient absolutely denies any suicidal ideation. Denies any psychotic symptoms. She does say that her energy is down but she is able to articulate several positive things in her life including her dogs, her daughter, her religious faith and some general optimism in that regard. Does not drink does not abuse substances.  Social history: Patient lives with her daughter. As mentioned previously had a son who committed suicide recently. Seems to have a good relationship with the remainder of her family.  Medical history: Long list of medical problems including heart failure diabetes hypertension. Quite sick right now. Now needing 24-hour day oxygen.  Substance abuse history: Denies any use of alcohol or drugs current or past  Past Psychiatric History: Patient thinks that she seems to remember many many years ago having been treated for depression but has never been in a psychiatric hospital. No history of suicide attempts. Remember the names of any medicine. She was not aware of the fact that she is currently on antidepressant medicine. No other history that I could find in the chart specifically related to psychiatric illness. Currently she is on standing doses of Xanax and citalopram. No history of mania or psychosis.  Risk to Self: Is patient at risk for suicide?: No Risk to Others:   Prior Inpatient Therapy:   Prior Outpatient Therapy:    Past Medical History:  Past Medical History  Diagnosis Date  . Essential hypertension   . Diabetes mellitus type II, controlled ([place])   . COPD (chronic obstructive pulmonary disease) ([place])   . Osteoporosis   . Carotid arterial disease ([place])     a. 2012 Carotid dopplers: 40-59% R ICA, 60-79% L ICA  . Tic douloureux   . Arthralgia   . Sjoegren syndrome ([place])   . TIA (transient ischemic attack)   . Emphysema   . MVP (mitral valve  prolapse)   . Chronic diastolic CHF (congestive heart failure) ([place])     a. 08/2011 Echo: EF 65-70%, mild LVH, mod dil LA, mildly dil RA, mild MR, mild-mod AoV Sclerosis w/o stenosis, mod-sev elev PA pressures, mild TR.  Marland Kitchen Acid reflux   . Diverticulitis   . Anemia   . History of pneumonia   . Syncope and collapse   . Anemia   . Hip fracture, left ([place])   . Hyperlipidemia   . Hiatal hernia   . Chest pain     a. 08/2011 MV: EF 74%, no ischemia.  . Noncompliance     a. h/o not taking bumex as Rx.  Marland Kitchen PAF (paroxysmal atrial fibrillation) (HCC)     a. CHA2DS2VASc = 8 ->No OAC 2/2 falls.    Past Surgical History  Procedure Laterality Date  . Cholecystectomy  1965  . Abdominal hysterectomy  1964  . Cataract extraction Bilateral   . Hip fracture surgery  2014    left hip   Family History:  Family History  Problem Relation Age of Onset  . Heart attack Mother   . Heart attack Father   . Heart attack Sister    Family Psychiatric  History: Son committed suicide but I did not inquire in depth about whether he had any particular kind of mental health problem. She denies knowing of any other mental health issues in  the family. Social History:  History  Alcohol Use  . Yes     History  Drug Use No    Social History   Social History  . Marital Status: Divorced    Spouse Name: N/A  . Number of Children: 2  . Years of Education: N/A   Occupational History  . Retired     former Chemical engineer   Social History Main Topics  . Smoking status: Former Smoker -- 0.50 packs/day for 30 years    Types: Cigarettes    Quit date: 10/22/1994  . Smokeless tobacco: Never Used  . Alcohol Use: Yes  . Drug Use: No  . Sexual Activity: Not Asked   Other Topics Concern  . None   Social History Narrative   Additional Social History:    Allergies:   Allergies  Allergen Reactions  . Singlet [Chlorphen-Pe-Acetaminophen] Shortness Of Breath  . Atorvastatin Other (See Comments)     Reaction:  Unknown   . Clindamycin Diarrhea and Nausea And Vomiting  . Codeine Other (See Comments)    Reaction:  Unknown   . Doxycycline Hives and Itching  . Epinephrine Hives and Itching  . Erythromycin Hives and Itching  . Fluticasone-Salmeterol Other (See Comments)    Reaction:  Unknown   . Lasix [Furosemide] Itching  . Levofloxacin Other (See Comments)    Reaction:  Unknown   . Penicillins Other (See Comments)    Reaction:  Unknown   . Propoxyphene Hcl Other (See Comments)    Reaction:  Unknown   . Rosuvastatin Other (See Comments)    Reaction:  Unknown   . Simvastatin Other (See Comments)    Reaction:  Unknown   . Sulfamethoxazole-Trimethoprim Other (See Comments)    Reaction:  Unknown   . Teriparatide Other (See Comments)    Reaction:  Unknown   . Tetracyclines & Related Hives and Itching  . Ceclor [Cefaclor] Hives and Other (See Comments)    Reaction:  Altered mental status    . Glipizide Hives and Itching  . Lisinopril Hives and Itching    Labs:  Results for orders placed or performed during the hospital encounter of 01/10/16 (from the past 48 hour(s))  Hemoglobin     Status: Abnormal   Collection Time: 01/11/16  6:41 PM  Result Value Ref Range   Hemoglobin 9.1 (L) 12.0 - 16.0 g/dL  Hemoglobin A1c     Status: Abnormal   Collection Time: 01/11/16  6:41 PM  Result Value Ref Range   Hgb A1c MFr Bld 6.2 (H) 4.0 - 6.0 %  Hemoglobin     Status: Abnormal   Collection Time: 01/12/16  2:47 AM  Result Value Ref Range   Hemoglobin 8.8 (L) 12.0 - 16.0 g/dL  CBC     Status: Abnormal   Collection Time: 01/12/16  4:42 AM  Result Value Ref Range   WBC 4.7 3.6 - 11.0 K/uL   RBC 3.21 (L) 3.80 - 5.20 MIL/uL   Hemoglobin 8.4 (L) 12.0 - 16.0 g/dL   HCT 26.0 (L) 35.0 - 47.0 %   MCV 80.9 80.0 - 100.0 fL   MCH 26.2 26.0 - 34.0 pg   MCHC 32.4 32.0 - 36.0 g/dL   RDW 19.8 (H) 11.5 - 14.5 %   Platelets 310 150 - 440 K/uL  Basic metabolic panel     Status: Abnormal   Collection  Time: 01/12/16  4:42 AM  Result Value Ref Range   Sodium 132 (L) 135 - 145  mmol/L   Potassium 4.3 3.5 - 5.1 mmol/L   Chloride 93 (L) 101 - 111 mmol/L   CO2 34 (H) 22 - 32 mmol/L   Glucose, Bld 203 (H) 65 - 99 mg/dL   BUN 24 (H) 6 - 20 mg/dL   Creatinine, Ser 0.59 0.44 - 1.00 mg/dL   Calcium 8.2 (L) 8.9 - 10.3 mg/dL   GFR calc non Af Amer >60 >60 mL/min   GFR calc Af Amer >60 >60 mL/min    Comment: (NOTE) The eGFR has been calculated using the CKD EPI equation. This calculation has not been validated in all clinical situations. eGFR's persistently <60 mL/min signify possible Chronic Kidney Disease.    Anion gap 5 5 - 15  Troponin I (q 6hr x 3)     Status: None   Collection Time: 01/12/16  4:42 AM  Result Value Ref Range   Troponin I <0.03 <0.031 ng/mL    Comment:        NO INDICATION OF MYOCARDIAL INJURY.   Hemoglobin     Status: Abnormal   Collection Time: 01/12/16 11:03 AM  Result Value Ref Range   Hemoglobin 8.6 (L) 12.0 - 16.0 g/dL  Troponin I (q 6hr x 3)     Status: None   Collection Time: 01/12/16 11:06 AM  Result Value Ref Range   Troponin I <0.03 <0.031 ng/mL    Comment:        NO INDICATION OF MYOCARDIAL INJURY.   Troponin I (q 6hr x 3)     Status: None   Collection Time: 01/12/16  3:51 PM  Result Value Ref Range   Troponin I <0.03 <0.031 ng/mL    Comment:        NO INDICATION OF MYOCARDIAL INJURY.   Basic metabolic panel     Status: Abnormal   Collection Time: 01/13/16  8:10 AM  Result Value Ref Range   Sodium 133 (L) 135 - 145 mmol/L   Potassium 4.4 3.5 - 5.1 mmol/L   Chloride 95 (L) 101 - 111 mmol/L   CO2 38 (H) 22 - 32 mmol/L   Glucose, Bld 187 (H) 65 - 99 mg/dL   BUN 25 (H) 6 - 20 mg/dL   Creatinine, Ser 0.57 0.44 - 1.00 mg/dL   Calcium 8.3 (L) 8.9 - 10.3 mg/dL   GFR calc non Af Amer >60 >60 mL/min   GFR calc Af Amer >60 >60 mL/min    Comment: (NOTE) The eGFR has been calculated using the CKD EPI equation. This calculation has not been  validated in all clinical situations. eGFR's persistently <60 mL/min signify possible Chronic Kidney Disease.    Anion gap 0 (L) 5 - 15    Current Facility-Administered Medications  Medication Dose Route Frequency Provider Last Rate Last Dose  . 0.9 %  sodium chloride infusion  10 mL/hr Intravenous Once Harvest Dark, MD   Stopped at 01/10/16 1851  . albuterol (PROVENTIL) (2.5 MG/3ML) 0.083% nebulizer solution 2.5 mg  2.5 mg Nebulization Q6H PRN Loletha Grayer, MD   2.5 mg at 01/11/16 1818  . ALPRAZolam Duanne Moron) tablet 0.25 mg  0.25 mg Oral TID Laverle Hobby, MD   0.25 mg at 01/13/16 1555  . antiseptic oral rinse (CPC / CETYLPYRIDINIUM CHLORIDE 0.05%) solution 7 mL  7 mL Mouth Rinse q12n4p Loletha Grayer, MD   7 mL at 01/13/16 1556  . aspirin EC tablet 81 mg  81 mg Oral Q lunch Loletha Grayer, MD   81 mg at  01/13/16 1130  . budesonide (PULMICORT) nebulizer solution 0.25 mg  0.25 mg Nebulization BID Loletha Grayer, MD   0.25 mg at 01/13/16 1761  . bumetanide (BUMEX) injection 1 mg  1 mg Intravenous Once Loletha Grayer, MD   1 mg at 01/10/16 2000  . bumetanide (BUMEX) tablet 1 mg  1 mg Oral Daily Sital Mody, MD   1 mg at 01/13/16 1600  . chlorhexidine (PERIDEX) 0.12 % solution 15 mL  15 mL Mouth Rinse QHS Loletha Grayer, MD   15 mL at 01/12/16 2107  . chlorhexidine (PERIDEX) 0.12 % solution 15 mL  15 mL Mouth Rinse BID Loletha Grayer, MD   15 mL at 01/13/16 1004  . citalopram (CELEXA) tablet 20 mg  20 mg Oral Q lunch Loletha Grayer, MD   20 mg at 01/13/16 1130  . diphenhydrAMINE (BENADRYL) capsule 25 mg  25 mg Oral QHS PRN Loletha Grayer, MD   25 mg at 01/12/16 0255  . doxazosin (CARDURA) tablet 4 mg  4 mg Oral QHS Loletha Grayer, MD   4 mg at 01/12/16 2107  . enoxaparin (LOVENOX) injection 40 mg  40 mg Subcutaneous Q24H Laverle Hobby, MD   40 mg at 01/13/16 1004  . ezetimibe (ZETIA) tablet 10 mg  10 mg Oral QHS Loletha Grayer, MD   10 mg at 01/12/16 2107  . feeding  supplement (ENSURE ENLIVE) (ENSURE ENLIVE) liquid 237 mL  237 mL Oral BID BM Vipul Shah, MD   237 mL at 01/13/16 1556  . fluticasone (FLONASE) 50 MCG/ACT nasal spray 1 spray  1 spray Each Nare BID Loletha Grayer, MD   1 spray at 01/13/16 1005  . gabapentin (NEURONTIN) capsule 100 mg  100 mg Oral BID Loletha Grayer, MD   100 mg at 01/13/16 1003  . hydrALAZINE (APRESOLINE) tablet 10 mg  10 mg Oral Q4H PRN Max Sane, MD   10 mg at 01/12/16 1603  . hydrALAZINE (APRESOLINE) tablet 25 mg  25 mg Oral 3 times per day Max Sane, MD   25 mg at 01/13/16 1555  . HYDROcodone-acetaminophen (NORCO/VICODIN) 5-325 MG per tablet 1 tablet  1 tablet Oral Q4H PRN Loletha Grayer, MD   1 tablet at 01/12/16 0151  . methylPREDNISolone sodium succinate (SOLU-MEDROL) 40 mg/mL injection 40 mg  40 mg Intravenous Q12H Laverle Hobby, MD   40 mg at 01/13/16 1009  . mupirocin ointment (BACTROBAN) 2 % 1 application  1 application Topical Daily PRN Loletha Grayer, MD      . nebivolol (BYSTOLIC) tablet 5 mg  5 mg Oral Daily Pavan Pyreddy, MD   5 mg at 01/13/16 1003  . nitroGLYCERIN (NITROSTAT) SL tablet 0.4 mg  0.4 mg Sublingual Q5 min PRN Loletha Grayer, MD   0.4 mg at 01/12/16 0556  . pantoprazole (PROTONIX) injection 40 mg  40 mg Intravenous Q12H Loletha Grayer, MD   40 mg at 01/13/16 1014  . polyvinyl alcohol (LIQUIFILM TEARS) 1.4 % ophthalmic solution 1 drop  1 drop Both Eyes BID Loletha Grayer, MD   1 drop at 01/13/16 1005  . promethazine (PHENERGAN) tablet 25 mg  25 mg Oral Q8H PRN Loletha Grayer, MD      . sodium chloride flush (NS) 0.9 % injection 3 mL  3 mL Intravenous Q12H Loletha Grayer, MD   3 mL at 01/13/16 1010  . spironolactone (ALDACTONE) tablet 25 mg  25 mg Oral Q M,W,F Loletha Grayer, MD   25 mg at 01/13/16 1003  . tiotropium [facility]) inhalation  capsule 18 mcg  18 mcg Inhalation Daily Loletha Grayer, MD   18 mcg at 01/13/16 1005   Facility-Administered Medications Ordered in Other Encounters   Medication Dose Route Frequency Provider Last Rate Last Dose  . 0.9 %  sodium chloride infusion   Intravenous Once Lequita Asal, MD      . alteplase (CATHFLO ACTIVASE) injection 2 mg  2 mg Intracatheter Once PRN Lequita Asal, MD      . ferumoxytol (FERAHEME) 510 mg in sodium chloride 0.9 % 100 mL IVPB  510 mg Intravenous Once Lequita Asal, MD      . heparin lock flush 100 unit/mL  500 Units Intracatheter Once PRN Lequita Asal, MD      . heparin lock flush 100 unit/mL  250 Units Intracatheter Once PRN Lequita Asal, MD      . sodium chloride 0.9 % injection 10 mL  10 mL Intracatheter PRN Lequita Asal, MD      . sodium chloride 0.9 % injection 3 mL  3 mL Intravenous Once PRN Lequita Asal, MD        Musculoskeletal: Strength & Muscle Tone: decreased Gait & Station: unable to stand Patient leans: Backward  Psychiatric Specialty Exam: Review of Systems  Constitutional: Positive for malaise/fatigue.  HENT: Negative.   Eyes: Negative.   Respiratory: Positive for shortness of breath.   Cardiovascular: Positive for chest pain.  Gastrointestinal: Negative.   Musculoskeletal: Negative.   Skin: Negative.   Neurological: Positive for weakness.  Psychiatric/Behavioral: Positive for depression. Negative for suicidal ideas, hallucinations, memory loss and substance abuse. The patient is not nervous/anxious and does not have insomnia.     Blood pressure 160/70, pulse 72, temperature 97.3 F (36.3 C), temperature source Oral, resp. rate 15, height 5' (1.524 m), weight 74.5 kg (164 lb 3.9 oz), SpO2 100 %.Body mass index is 32.08 kg/(m^2).  General Appearance: Fairly Groomed  Engineer, water::  Minimal  Speech:  Slow  Volume:  Normal  Mood:  Dysphoric  Affect:  Appropriate  Thought Process:  Goal Directed  Orientation:  Full (Time, Place, and Person)  Thought Content:  Negative  Suicidal Thoughts:  No  Homicidal Thoughts:  No  Memory:  Immediate;    Good Recent;   Fair Remote;   Fair  Judgement:  Good  Insight:  Fair  Psychomotor Activity:  Decreased  Concentration:  Fair  Recall:  [employer] of Knowledge:Fair  Language: Fair  Akathisia:  No  Handed:  Right  AIMS (if indicated):     Assets:  Communication Skills Desire for Improvement Financial Resources/Insurance Housing Resilience Social Support  ADL's:  Impaired  Cognition: Impaired,  Mild  Sleep:      Treatment Plan Summary: Daily contact with patient to assess and evaluate symptoms and progress in treatment, Medication management and Plan After reviewing medication I am going to make a small adjustment to her antidepressant. I will increase the citalopram to 30 mg per day. Patient and daughter tell me that she has been on that medicine for many years at the same dose. The worsening of her mood is probably worthwhile to try increasing it by a little bit. I would give that at least 2 or 3 weeks to see if it makes a difference before considering whether or not to go up to 40 mg. Supportive counseling and encouragement to the patient. No other treatment change today. Contact on call psychiatry as needed.  Disposition: Patient does not  meet criteria for psychiatric inpatient admission. Supportive therapy provided about ongoing stressors.  Alethia Berthold, MD 01/13/2016 6:18 PM

## 2016-01-13 NOTE — Care Management (Signed)
PT consult pending 

## 2016-01-13 NOTE — Progress Notes (Signed)
PT Cancellation Note  Patient Details Name: Catherine Holmes MRN: [id number] DOB: Jun 01, 1928   Cancelled Treatment:    Reason Eval/Treat Not Completed: Patient declined, no reason specified Pt states she has too much going on today and not to come back tomorrow.  She doesn't want any PT here, but apparently she will have her normal HHPT waiting for her when she gets back home...   Pt unequivocally refuses PT and genuinely became agitated at the notion that in order to have PT at home it may be a good idea to have a PT look at and assess her her in the the hospital.  Catherine Holmes, PT, DPT [phone]#[id number]  Catherine Holmes 01/13/2016, 4:24 PM

## 2016-01-13 NOTE — Progress Notes (Signed)
[facility] - [place] at [facility]   PATIENT NAME: Catherine Holmes    MR#:  [id number]  DATE OF BIRTH:  Oct 25, 1927  SUBJECTIVE:  Patient on Bipap this morning  REVIEW OF SYSTEMS:    Review of Systems  Constitutional: Negative for fever, chills and malaise/fatigue.  HENT: Negative for ear discharge, ear pain, hearing loss, nosebleeds and sore throat.   Eyes: Negative for blurred vision and pain.  Respiratory: Positive for cough and shortness of breath (better). Negative for hemoptysis and wheezing.   Cardiovascular: Negative for chest pain, palpitations and leg swelling.  Gastrointestinal: Negative for nausea, vomiting, abdominal pain, diarrhea and blood in stool.  Genitourinary: Negative for dysuria.  Musculoskeletal: Negative for back pain.  Neurological: Negative for dizziness, tremors, speech change, focal weakness, seizures and headaches.  Endo/Heme/Allergies: Does not bruise/bleed easily.  Psychiatric/Behavioral: Negative for depression, suicidal ideas and hallucinations.    Tolerating Diet:yes heart healthy      DRUG ALLERGIES:   Allergies  Allergen Reactions  . Singlet [Chlorphen-Pe-Acetaminophen] Shortness Of Breath  . Atorvastatin Other (See Comments)    Reaction:  Unknown   . Clindamycin Diarrhea and Nausea And Vomiting  . Codeine Other (See Comments)    Reaction:  Unknown   . Doxycycline Hives and Itching  . Epinephrine Hives and Itching  . Erythromycin Hives and Itching  . Fluticasone-Salmeterol Other (See Comments)    Reaction:  Unknown   . Lasix [Furosemide] Itching  . Levofloxacin Other (See Comments)    Reaction:  Unknown   . Penicillins Other (See Comments)    Reaction:  Unknown   . Propoxyphene Hcl Other (See Comments)    Reaction:  Unknown   . Rosuvastatin Other (See Comments)    Reaction:  Unknown   . Simvastatin Other (See Comments)    Reaction:  Unknown   . Sulfamethoxazole-Trimethoprim Other (See Comments)   Reaction:  Unknown   . Teriparatide Other (See Comments)    Reaction:  Unknown   . Tetracyclines & Related Hives and Itching  . Ceclor [Cefaclor] Hives and Other (See Comments)    Reaction:  Altered mental status    . Glipizide Hives and Itching  . Lisinopril Hives and Itching    VITALS:  Blood pressure 134/72, pulse 64, temperature 97.5 F (36.4 C), temperature source Oral, resp. rate 20, height 5' (1.524 m), weight 74.5 kg (164 lb 3.9 oz), SpO2 93 %.  PHYSICAL EXAMINATION:   Physical Exam  Constitutional: She is well-developed, well-nourished, and in no distress. No distress.  HENT:  Head: Normocephalic.  BIPAP  Eyes: No scleral icterus.  Neck: Normal range of motion. Neck supple. No JVD present. No tracheal deviation present.  Cardiovascular: Normal rate and regular rhythm.  Exam reveals no gallop and no friction rub.   Murmur heard. Pulmonary/Chest: Effort normal and breath sounds normal. No respiratory distress. She has no wheezes. She has no rales. She exhibits no tenderness.  Abdominal: Soft. Bowel sounds are normal. She exhibits no distension and no mass. There is no tenderness. There is no rebound and no guarding.  Musculoskeletal: Normal range of motion. She exhibits no edema.  Neurological: She is alert.  Skin: Skin is warm. No rash noted. No erythema.  Psychiatric: Judgment normal.      LABORATORY PANEL:   CBC  Recent Labs Lab 01/12/16 0442 01/12/16 1103  WBC 4.7  --   HGB 8.4* 8.6*  HCT 26.0*  --   PLT 310  --    ------------------------------------------------------------------------------------------------------------------  Chemistries   Recent Labs Lab 01/10/16 1526  01/13/16 0810  NA 133*  < > 133*  K 3.7  < > 4.4  CL 95*  < > 95*  CO2 32  < > 38*  GLUCOSE 150*  < > 187*  BUN 21*  < > 25*  CREATININE 0.67  < > 0.57  CALCIUM 8.2*  < > 8.3*  AST 19  --   --   ALT 21  --   --   ALKPHOS 67  --   --   BILITOT 0.4  --   --   < > =  values in this interval not displayed. ------------------------------------------------------------------------------------------------------------------  Cardiac Enzymes  Recent Labs Lab 01/12/16 0442 01/12/16 1106 01/12/16 1551  TROPONINI <0.03 <0.03 <0.03   ------------------------------------------------------------------------------------------------------------------  RADIOLOGY:  Dg Chest 1 View  01/13/2016  CLINICAL DATA:  Respiratory failure EXAM: CHEST 1 VIEW COMPARISON:  01/10/2016 FINDINGS: Vascular congestion and interstitial edema have improved. Hazy opacity at the right base likely a combination of pleural fluid and volume loss is stable. Stable hiatal hernia. Cardiomegaly. No pneumothorax. IMPRESSION: Improved vascular congestion and edema. Stable right basilar opacity. Electronically Signed   By: Jolaine ClickArthur  Hoss M.D.   On: 01/13/2016 08:48     ASSESSMENT AND PLAN:   82 year old female who presents with acute hypoxic respiratory failure due to COPD exacerbation and diastolic heart failure.  1. Acute hypoxic respiratory failure: Chest x-ray today shows improved vascular congestion. BiPAP is being weaned off. Add by mouth Bumex Continue IV steroids. Plan to wean in the next day. Continue nebulizer treatment.  2. Acute on chronic diastolic heart failure: Continue Bumex. Monitor I/O. Continue Aldactone systolic   3. Acute COPD exacerbation with improvement: Continue plan as outlined above.  4. Essential hypertension: Continue hydralazine, Bystolic, Aldactone, Cardura,  5. Depression: Continue Celexa appreciate psychiatry consult   6. PAF: Continue aspirin. No anticoagulation due to history of falls   continue Bystolic for heart rate control.   8. Hyponatremia: improving Recheck in am  9. Acute blood loss anemia: Patient is status post 1 unit PRBC. Hemoglobin at admission was 7.2. Hemoglobin remained stable. Suspect anemia may have driven diastolic heart  failure. Full code  TOTAL TIME TAKING CARE OF THIS PATIENT:  24minutes.     PT consult for disposition.  POSSIBLE 1-2, DEPENDING ON CLINICAL CONDITION.   Jaiveer Panas M.D on 01/13/2016 at 11:50 AM  Between 7am to 6pm - Pager - [phone] After 6pm go to [email or web address] - password [email or web address] [facility]  [facility]  Office  [phone]  CC: Primary care physician; Marisue IvanLINTHAVONG, KANHKA, MD  Note: This dictation was prepared with Dragon dictation along with smaller phrase technology. Any transcriptional errors that result from this process are unintentional.

## 2016-01-13 NOTE — Care Management Important Message (Signed)
Important Message  Patient Details  Name: Catherine Holmes MRN: [id number] Date of Birth: Sep 24, 1928   Medicare Important Message Given:  Yes    Olegario MessierKathy A Jerrica Thorman 01/13/2016, 1:03 PM

## 2016-01-14 LAB — BASIC METABOLIC PANEL
ANION GAP: 3 — AB (ref 5–15)
ANION GAP: 4 — AB (ref 5–15)
BUN: 28 mg/dL — ABNORMAL HIGH (ref 6–20)
BUN: 26 mg/dL — ABNORMAL HIGH (ref 6–20)
CALCIUM: 8.2 mg/dL — AB (ref 8.9–10.3)
CHLORIDE: 91 mmol/L — AB (ref 101–111)
CO2: 40 mmol/L — ABNORMAL HIGH (ref 22–32)
CO2: 38 mmol/L — ABNORMAL HIGH (ref 22–32)
CREATININE: 0.55 mg/dL (ref 0.44–1.00)
Calcium: 8.3 mg/dL — ABNORMAL LOW (ref 8.9–10.3)
Chloride: 92 mmol/L — ABNORMAL LOW (ref 101–111)
Creatinine, Ser: 0.72 mg/dL (ref 0.44–1.00)
GFR calc non Af Amer: >60 mL/min (ref >60)
Glucose, Bld: 232 mg/dL — ABNORMAL HIGH (ref 65–99)
Glucose, Bld: 262 mg/dL — ABNORMAL HIGH (ref 65–99)
POTASSIUM: 4.1 mmol/L (ref 3.5–5.1)
Potassium: 4.5 mmol/L (ref 3.5–5.1)
SODIUM: 134 mmol/L — AB (ref 135–145)
Sodium: 134 mmol/L — ABNORMAL LOW (ref 135–145)

## 2016-01-14 LAB — MAGNESIUM: MAGNESIUM: 1.5 mg/dL — AB (ref 1.7–2.4)

## 2016-01-14 MED ORDER — AMIODARONE IV BOLUS ONLY 150 MG/100ML
150.0000 mg | Freq: Once | INTRAVENOUS | Status: AC
  Administered 2016-01-14: 150 mg via INTRAVENOUS
  Filled 2016-01-14: qty 100

## 2016-01-14 MED ORDER — MAGNESIUM SULFATE 2 GM/50ML IV SOLN
2.0000 g | Freq: Once | INTRAVENOUS | Status: AC
  Administered 2016-01-14: 2 g via INTRAVENOUS
  Filled 2016-01-14 (×2): qty 50

## 2016-01-14 MED ORDER — ALPRAZOLAM 0.25 MG PO TABS
0.2500 mg | ORAL_TABLET | Freq: Three times a day (TID) | ORAL | Status: DC | PRN
  Administered 2016-01-14: 0.25 mg via ORAL
  Filled 2016-01-14: qty 1

## 2016-01-14 MED ORDER — ALPRAZOLAM 0.25 MG PO TABS
0.2500 mg | ORAL_TABLET | Freq: Two times a day (BID) | ORAL | Status: DC
  Administered 2016-01-15 – 2016-01-19 (×9): 0.25 mg via ORAL
  Filled 2016-01-14 (×9): qty 1

## 2016-01-14 MED ORDER — CITALOPRAM HYDROBROMIDE 20 MG PO TABS
20.0000 mg | ORAL_TABLET | Freq: Every day | ORAL | Status: DC
  Administered 2016-01-14 – 2016-01-18 (×5): 20 mg via ORAL
  Filled 2016-01-14 (×6): qty 1

## 2016-01-14 NOTE — Progress Notes (Addendum)
Patient transferred from [facility] to 2A rm257, report from NIKEMichael RN. Oriented to room, Ascom phones, call bell and staff. Bed in low position. Fall safety plan reviewed, contract signed and placed on wall, yellow non-skid socks in place, bed alarm on. Full assessment to Epic; skin assessed with Margaretmary DysJessica Christmas, RN. Telemetry box verified with tele clerk and Tobi Bastosnna Apple: [id number]. Will continue to monitor.  Amio bolus in progress, pt tolerating well.

## 2016-01-14 NOTE — Progress Notes (Signed)
Paged Dr Amado CoeGouru re IV amiodarone; told Dr that RN's on 2C may not administer this drug IV; Dr stated to transfer pt to 2A (Telemetry)

## 2016-01-14 NOTE — Plan of Care (Signed)
Problem: Skin Integrity: Goal: Risk for impaired skin integrity will decrease Outcome: Progressing Moisture/protective barrier applied to sacrum and foam dressing changed and refreshed.  Problem: Tissue Perfusion: Goal: Risk factors for ineffective tissue perfusion will decrease Outcome: Progressing SCDs in place.

## 2016-01-14 NOTE — Progress Notes (Signed)
Paged Dr Juliene PinaMody re pt SVT; awaiting return call

## 2016-01-14 NOTE — Progress Notes (Signed)
Notified Dr Amado CoeGouru of changes in pt heart rhythm; told Dr tele monitor had shown non-sustained VT, SV rhythm, PVC's, and afib; Dr acknowledged and stated she would put in orders

## 2016-01-14 NOTE — Progress Notes (Addendum)
Dr. Dory Larsenline's number given to Dr. Amado CoeGouru so she can call her consult. Cardiology and Dr. Amado CoeGouru paged and made aware of EKG results and that patients telemetry is showing some ST depression. Both MDs aware and acknowledged. Amio bolus given and pt remains afib/sinus arrhythmia 70s. Patient in NAD. No further orders, will continue to monitor.

## 2016-01-14 NOTE — Progress Notes (Signed)
Pt transferred to 2A; report called to Maddie, RN; central tele notified of pt transfer; RN and NT accompanied pt during transfer, and met RN Maddie in room at bedside; all pt belongings brought w/ by RN and NT; no acute distress at time of transfer; pt daughter Neoma Laming notified of transfer

## 2016-01-14 NOTE — Progress Notes (Signed)
PT Cancellation Note  Patient Details Name: Catherine Holmes MRN: [id number] DOB: 11-Nov-1927   Cancelled Treatment:    Reason Eval/Treat Not Completed: Patient declined, no reason specified Pt states "You can come back Monday.  I've been too ill to do anything today and Sunday is my day for prayer sir."   Malachi ProGalen R Kikuye Korenek 01/14/2016, 1:36 PM

## 2016-01-14 NOTE — Progress Notes (Signed)
Notified Dr Luberta MutterKonidena that RN was notified by central telemetry that pt had a 23 beat run of SVT; Dr acknowledged, ordered mag and BMP drawn; Dr also stated that this pt would be Dr Rob HickmanGouru's pt today

## 2016-01-14 NOTE — Progress Notes (Addendum)
[facility] - [place] at [facility]   PATIENT NAME: Catherine Holmes    MR#:  [id number]  DATE OF BIRTH:  1928-09-07  SUBJECTIVE:  Patient off Bipap this morning, not feeling well durin my exam.  Tele reports psvt and NSVT - few beats and multiple PVCs   REVIEW OF SYSTEMS:    Review of Systems  Constitutional: Negative for fever, chills and malaise/fatigue.  HENT: Negative for ear discharge, ear pain, hearing loss, nosebleeds and sore throat.   Eyes: Negative for blurred vision and pain.  Respiratory: Positive for cough and shortness of breath (better). Negative for hemoptysis and wheezing.   Cardiovascular: Negative for chest pain, palpitations and leg swelling.  Gastrointestinal: Negative for nausea, vomiting, abdominal pain, diarrhea and blood in stool.  Genitourinary: Negative for dysuria.  Musculoskeletal: Negative for back pain.  Neurological: Negative for dizziness, tremors, speech change, focal weakness, seizures and headaches.  Endo/Heme/Allergies: Does not bruise/bleed easily.  Psychiatric/Behavioral: Negative for depression, suicidal ideas and hallucinations.    Tolerating Diet:yes heart healthy      DRUG ALLERGIES:   Allergies  Allergen Reactions  . Singlet [Chlorphen-Pe-Acetaminophen] Shortness Of Breath  . Atorvastatin Other (See Comments)    Reaction:  Unknown   . Clindamycin Diarrhea and Nausea And Vomiting  . Codeine Other (See Comments)    Reaction:  Unknown   . Doxycycline Hives and Itching  . Epinephrine Hives and Itching  . Erythromycin Hives and Itching  . Fluticasone-Salmeterol Other (See Comments)    Reaction:  Unknown   . Lasix [Furosemide] Itching  . Levofloxacin Other (See Comments)    Reaction:  Unknown   . Penicillins Other (See Comments)    Reaction:  Unknown   . Propoxyphene Hcl Other (See Comments)    Reaction:  Unknown   . Rosuvastatin Other (See Comments)    Reaction:  Unknown   . Simvastatin Other (See  Comments)    Reaction:  Unknown   . Sulfamethoxazole-Trimethoprim Other (See Comments)    Reaction:  Unknown   . Teriparatide Other (See Comments)    Reaction:  Unknown   . Tetracyclines & Related Hives and Itching  . Ceclor [Cefaclor] Hives and Other (See Comments)    Reaction:  Altered mental status    . Glipizide Hives and Itching  . Lisinopril Hives and Itching    VITALS:  Blood pressure 107/52, pulse 65, temperature 97.7 F (36.5 C), temperature source Oral, resp. rate 20, height 5' (1.524 m), weight 72.984 kg (160 lb 14.4 oz), SpO2 95 %.  PHYSICAL EXAMINATION:   Physical Exam  Constitutional: She is well-developed, well-nourished, and in no distress. No distress.  HENT:  Head: Normocephalic.  BIPAP  Eyes: No scleral icterus.  Neck: Normal range of motion. Neck supple. No JVD present. No tracheal deviation present.  Cardiovascular: Normal rate and regular rhythm.  Exam reveals no gallop and no friction rub.   Murmur heard. Pulmonary/Chest: Effort normal and breath sounds normal. No respiratory distress. She has no wheezes. She has no rales. She exhibits no tenderness.  Abdominal: Soft. Bowel sounds are normal. She exhibits no distension and no mass. There is no tenderness. There is no rebound and no guarding.  Musculoskeletal: Normal range of motion. She exhibits no edema.  Neurological: She is alert.  Skin: Skin is warm. No rash noted. No erythema.  Psychiatric: Judgment normal.      LABORATORY PANEL:   CBC  Recent Labs Lab 01/12/16 0442 01/12/16 1103  WBC 4.7  --  HGB 8.4* 8.6*  HCT 26.0*  --   PLT 310  --    ------------------------------------------------------------------------------------------------------------------  Chemistries   Recent Labs Lab 01/10/16 1526  01/14/16 0934  NA 133*  < > 134*  K 3.7  < > 4.1  CL 95*  < > 92*  CO2 32  < > 38*  GLUCOSE 150*  < > 262*  BUN 21*  < > 26*  CREATININE 0.67  < > 0.72  CALCIUM 8.2*  < > 8.2*   MG  --   --  1.5*  AST 19  --   --   ALT 21  --   --   ALKPHOS 67  --   --   BILITOT 0.4  --   --   < > = values in this interval not displayed. ------------------------------------------------------------------------------------------------------------------  Cardiac Enzymes  Recent Labs Lab 01/12/16 0442 01/12/16 1106 01/12/16 1551  TROPONINI <0.03 <0.03 <0.03   ------------------------------------------------------------------------------------------------------------------  RADIOLOGY:  Dg Chest 1 View  01/13/2016  CLINICAL DATA:  Respiratory failure EXAM: CHEST 1 VIEW COMPARISON:  01/10/2016 FINDINGS: Vascular congestion and interstitial edema have improved. Hazy opacity at the right base likely a combination of pleural fluid and volume loss is stable. Stable hiatal hernia. Cardiomegaly. No pneumothorax. IMPRESSION: Improved vascular congestion and edema. Stable right basilar opacity. Electronically Signed   By: Jolaine ClickArthur  Hoss M.D.   On: 01/13/2016 08:48     ASSESSMENT AND PLAN:   82 year old female who presents with acute hypoxic respiratory failure due to COPD exacerbation and diastolic heart failure.  # Abnml cardiac rhythm with aberrancy  Transfer to tele  Stat EKG ordered and cardio consult placed , d/w - dr.Harding repleted magnesium  Amiodarone 150 iv once  Decreased Celexa and xanax doses as they could cause QT prolongation.  Appreciate cardio and psych recommendations   # Acute hypoxic respiratory failure:  BiPAP is being weaned off. on mouth Bumex Continue IV steroids. Plan to wean to PO  Continue nebulizer treatment.  2. Acute on chronic diastolic heart failure: Continue Bumex. Monitor I/O. Continue Aldactone   3. Acute COPD exacerbation with improvement: Continue plan as outlined above.  4. Essential hypertension: Continue hydralazine, Bystolic, Aldactone, Cardura,  5. Depression: Continue Celexa appreciate psychiatry consult   6. PAF: Continue  aspirin. No anticoagulation due to history of falls   continue Bystolic for heart rate control.   8. Hyponatremia: improving Recheck in am  9. Acute blood loss anemia: Patient is status post 1 unit PRBC. Hemoglobin at admission was 7.2. Hemoglobin remained stable. Suspect anemia may have driven diastolic heart failure.  Full code  TOTAL critical careTIME TAKING CARE OF THIS PATIENT:  41minutes.     PT consult for disposition.  POSSIBLE 1-2, DEPENDING ON CLINICAL CONDITION.   Ramonita LabGouru, Alyss Granato M.D on 01/14/2016 at 6:59 PM  Between 7am to 6pm - Pager - [phone] After 6pm go to [email or web address] - password [email or web address] [facility]  [facility]  Office  [phone]  CC: Primary care physician; Marisue IvanLINTHAVONG, KANHKA, MD  Note: This dictation was prepared with Dragon dictation along with smaller phrase technology. Any transcriptional errors that result from this process are unintentional.

## 2016-01-14 NOTE — Progress Notes (Signed)
Spoke w/ Dr Amado CoeGouru and notified her re pt 23 beat run of SVT; told her that BMP and mag labs had been ordered; Dr asked heart rhythm, notified her that pt was back to NSR, HR 80; Dr acknowledged, no new orders

## 2016-01-14 NOTE — Progress Notes (Signed)
Paged Dr Amado CoeGouru; awaiting callback

## 2016-01-14 NOTE — Consult Note (Signed)
[facility] Consult   Reason for Consult: Follow-up consult Referring Physician:Auna Gouru, M.D  Patient Identification: Catherine Holmes MRN:  [id number] Principal Diagnosis: Major depression single episode, moderate Diagnosis:   Patient Active Problem List   Diagnosis Date Noted  . Depression, major, single episode, mild ([place]) [F32.0] 01/12/2016  . Acute respiratory failure ([place]) [J96.00] 01/10/2016  . Pressure ulcer [L89.90] 11/14/2015  . COPD exacerbation ([place]) [J44.1] 11/13/2015  . Acute on chronic respiratory failure ([place]) [J96.20] 11/13/2015  . Anxiety and depression [F41.8] 11/10/2015  . Iron deficiency anemia [D50.9] 10/28/2015  . Musculoskeletal chest pain [R07.89] 07/29/2015  . Sternal fracture [S22.20XA] 07/29/2015  . Pneumonia [J18.9] 07/29/2015  . Acute on chronic respiratory failure with hypoxia and hypercapnia (HCC) [V56.43, J96.22] 07/28/2015  . Hyponatremia [E87.1] 04/11/2015  . Syncope [R55] 04/10/2015  . Absolute anemia [D64.9]   . Acute on chronic diastolic CHF (congestive heart failure) ([place]) [I50.33]   . CHF (congestive heart failure) ([place]) [I50.9] 03/31/2015  . Essential hypertension [I10]   . Diabetes mellitus type II, controlled ([place]) [E11.9]   . Hyposmolality and/or hyponatremia [E87.1] 12/04/2013  . Anemia [D64.9] 12/04/2013  . Chronic diastolic CHF (congestive heart failure) ([place]) [I50.32] 04/05/2013  . Positive fecal occult blood test [R19.5] 04/05/2013  . Fall [W19.XXXA] 11/20/2012  . Chest tightness [R07.89] 07/13/2011  . Diabetes type 2, uncontrolled ([place]) [E11.65] 11/29/2009  . Hyperlipidemia [E78.5] 11/29/2009  . TIC DOULOUREUX [G50.0] 11/29/2009  . HYPERTENSION, BENIGN [I10] 11/29/2009  . Mitral valve disorder [I05.9] 11/29/2009  . Congestive heart failure ([place]) [I50.9] 11/29/2009  . Carotid arterial disease ([place]) [I77.9] 11/29/2009  . TIA [G45.9] 11/29/2009  . PAD (peripheral artery disease) ([place]) [I73.9] 11/29/2009  .  EMPHYSEMA [J43.8] 11/29/2009  . COPD (chronic obstructive pulmonary disease) ([place]) [J44.9] 11/29/2009  . GERD [K21.9] 11/29/2009  . SJOGREN'S SYNDROME [M35.00] 11/29/2009  . ARTHRALGIA [M25.50] 11/29/2009  . OSTEOPOROSIS [M81.0] 11/29/2009  . DYSPNEA [R06.02] 11/29/2009    Total Time spent with patient: 45 minutes  Subjective:   Catherine Holmes is a 82 y.o. female patient admitted with "I'm not too well".  .  HPI:  Patient was seen for follow-up. Her chart was reviewed.  Patient reported that she is not doing well and is she feeling very tired depressed and has no energy. She reported that she was unable to sleep last night. She was noted to be restless during the interview. She reported that she feels depressed anxious overwhelmed and does not want to talk about the death of her son. I reviewed her medications and it was noted that she has been started on site help them 30 mg and she has increased SVT and her pulse has been declining in the past couple of days Patient reported that she feels very tired and feels short of breath. She reported that she is not eating well. She currently denied having any suicidal homicidal ideations or plans. She currently denied having any perceptual disturbances at this time appear apprehensive during the interview   Social history: Patient lives with her daughter. As mentioned previously had a son who committed suicide recently. Seems to have a good relationship with the remainder of her family.  Medical history: Long list of medical problems including heart failure diabetes hypertension. Quite sick right now. Now needing 24-hour day oxygen.  Substance abuse history: Denies any use of alcohol or drugs current or past  Past Psychiatric History: Patient thinks that she seems to remember many many years ago having been  treated for depression but has never been in a psychiatric hospital. No history of suicide attempts. Remember the names of any medicine.  She was not aware of the fact that she is currently on antidepressant medicine. No other history that I could find in the chart specifically related to psychiatric illness. Currently she is on standing doses of Xanax and citalopram. No history of mania or psychosis.  Risk to Self: Is patient at risk for suicide?: No Risk to Others:   Prior Inpatient Therapy:   Prior Outpatient Therapy:    Past Medical History:  Past Medical History  Diagnosis Date  . Essential hypertension   . Diabetes mellitus type II, controlled ([place])   . COPD (chronic obstructive pulmonary disease) ([place])   . Osteoporosis   . Carotid arterial disease ([place])     a. 2012 Carotid dopplers: 40-59% R ICA, 60-79% L ICA  . Tic douloureux   . Arthralgia   . Sjoegren syndrome ([place])   . TIA (transient ischemic attack)   . Emphysema   . MVP (mitral valve prolapse)   . Chronic diastolic CHF (congestive heart failure) ([place])     a. 08/2011 Echo: EF 65-70%, mild LVH, mod dil LA, mildly dil RA, mild MR, mild-mod AoV Sclerosis w/o stenosis, mod-sev elev PA pressures, mild TR.  Marland Kitchen Acid reflux   . Diverticulitis   . Anemia   . History of pneumonia   . Syncope and collapse   . Anemia   . Hip fracture, left ([place])   . Hyperlipidemia   . Hiatal hernia   . Chest pain     a. 08/2011 MV: EF 74%, no ischemia.  . Noncompliance     a. h/o not taking bumex as Rx.  Marland Kitchen PAF (paroxysmal atrial fibrillation) (HCC)     a. CHA2DS2VASc = 8 ->No OAC 2/2 falls.    Past Surgical History  Procedure Laterality Date  . Cholecystectomy  1965  . Abdominal hysterectomy  1964  . Cataract extraction Bilateral   . Hip fracture surgery  2014    left hip   Family History:  Family History  Problem Relation Age of Onset  . Heart attack Mother   . Heart attack Father   . Heart attack Sister    Family Psychiatric  History: Son committed suicide but I did not inquire in depth about whether he had any particular kind of mental health problem. She denies  knowing of any other mental health issues in the family. Social History:  History  Alcohol Use  . Yes     History  Drug Use No    Social History   Social History  . Marital Status: Divorced    Spouse Name: N/A  . Number of Children: 2  . Years of Education: N/A   Occupational History  . Retired     former Chemical engineer   Social History Main Topics  . Smoking status: Former Smoker -- 0.50 packs/day for 30 years    Types: Cigarettes    Quit date: 10/22/1994  . Smokeless tobacco: Never Used  . Alcohol Use: Yes  . Drug Use: No  . Sexual Activity: Not Asked   Other Topics Concern  . None   Social History Narrative   Additional Social History:    Allergies:   Allergies  Allergen Reactions  . Singlet [Chlorphen-Pe-Acetaminophen] Shortness Of Breath  . Atorvastatin Other (See Comments)    Reaction:  Unknown   . Clindamycin Diarrhea and Nausea And Vomiting  .  Codeine Other (See Comments)    Reaction:  Unknown   . Doxycycline Hives and Itching  . Epinephrine Hives and Itching  . Erythromycin Hives and Itching  . Fluticasone-Salmeterol Other (See Comments)    Reaction:  Unknown   . Lasix [Furosemide] Itching  . Levofloxacin Other (See Comments)    Reaction:  Unknown   . Penicillins Other (See Comments)    Reaction:  Unknown   . Propoxyphene Hcl Other (See Comments)    Reaction:  Unknown   . Rosuvastatin Other (See Comments)    Reaction:  Unknown   . Simvastatin Other (See Comments)    Reaction:  Unknown   . Sulfamethoxazole-Trimethoprim Other (See Comments)    Reaction:  Unknown   . Teriparatide Other (See Comments)    Reaction:  Unknown   . Tetracyclines & Related Hives and Itching  . Ceclor [Cefaclor] Hives and Other (See Comments)    Reaction:  Altered mental status    . Glipizide Hives and Itching  . Lisinopril Hives and Itching    Labs:  Results for orders placed or performed during the hospital encounter of 01/10/16 (from the past 48 hour(s))   Troponin I (q 6hr x 3)     Status: None   Collection Time: 01/12/16  3:51 PM  Result Value Ref Range   Troponin I <0.03 <0.031 ng/mL    Comment:        NO INDICATION OF MYOCARDIAL INJURY.   Basic metabolic panel     Status: Abnormal   Collection Time: 01/13/16  8:10 AM  Result Value Ref Range   Sodium 133 (L) 135 - 145 mmol/L   Potassium 4.4 3.5 - 5.1 mmol/L   Chloride 95 (L) 101 - 111 mmol/L   CO2 38 (H) 22 - 32 mmol/L   Glucose, Bld 187 (H) 65 - 99 mg/dL   BUN 25 (H) 6 - 20 mg/dL   Creatinine, Ser 0.57 0.44 - 1.00 mg/dL   Calcium 8.3 (L) 8.9 - 10.3 mg/dL   GFR calc non Af Amer >60 >60 mL/min   GFR calc Af Amer >60 >60 mL/min    Comment: (NOTE) The eGFR has been calculated using the CKD EPI equation. This calculation has not been validated in all clinical situations. eGFR's persistently <60 mL/min signify possible Chronic Kidney Disease.    Anion gap 0 (L) 5 - 15  Basic metabolic panel     Status: Abnormal   Collection Time: 01/14/16  4:57 AM  Result Value Ref Range   Sodium 134 (L) 135 - 145 mmol/L   Potassium 4.5 3.5 - 5.1 mmol/L   Chloride 91 (L) 101 - 111 mmol/L   CO2 40 (H) 22 - 32 mmol/L   Glucose, Bld 232 (H) 65 - 99 mg/dL   BUN 28 (H) 6 - 20 mg/dL   Creatinine, Ser 0.55 0.44 - 1.00 mg/dL   Calcium 8.3 (L) 8.9 - 10.3 mg/dL   GFR calc non Af Amer >60 >60 mL/min   GFR calc Af Amer >60 >60 mL/min    Comment: (NOTE) The eGFR has been calculated using the CKD EPI equation. This calculation has not been validated in all clinical situations. eGFR's persistently <60 mL/min signify possible Chronic Kidney Disease.    Anion gap 3 (L) 5 - 15  Magnesium     Status: Abnormal   Collection Time: 01/14/16  9:34 AM  Result Value Ref Range   Magnesium 1.5 (L) 1.7 - 2.4 mg/dL  Basic metabolic  panel     Status: Abnormal   Collection Time: 01/14/16  9:34 AM  Result Value Ref Range   Sodium 134 (L) 135 - 145 mmol/L   Potassium 4.1 3.5 - 5.1 mmol/L   Chloride 92 (L) 101 -  111 mmol/L   CO2 38 (H) 22 - 32 mmol/L   Glucose, Bld 262 (H) 65 - 99 mg/dL   BUN 26 (H) 6 - 20 mg/dL   Creatinine, Ser 0.72 0.44 - 1.00 mg/dL   Calcium 8.2 (L) 8.9 - 10.3 mg/dL   GFR calc non Af Amer >60 >60 mL/min   GFR calc Af Amer >60 >60 mL/min    Comment: (NOTE) The eGFR has been calculated using the CKD EPI equation. This calculation has not been validated in all clinical situations. eGFR's persistently <60 mL/min signify possible Chronic Kidney Disease.    Anion gap 4 (L) 5 - 15    Current Facility-Administered Medications  Medication Dose Route Frequency Provider Last Rate Last Dose  . 0.9 %  sodium chloride infusion  10 mL/hr Intravenous Once Harvest Dark, MD   Stopped at 01/10/16 1851  . albuterol (PROVENTIL) (2.5 MG/3ML) 0.083% nebulizer solution 2.5 mg  2.5 mg Nebulization Q6H PRN Loletha Grayer, MD   2.5 mg at 01/14/16 0740  . ALPRAZolam Duanne Moron) tablet 0.25 mg  0.25 mg Oral BID Rainey Pines, MD      . antiseptic oral rinse (CPC / CETYLPYRIDINIUM CHLORIDE 0.05%) solution 7 mL  7 mL Mouth Rinse q12n4p Loletha Grayer, MD   7 mL at 01/13/16 1556  . aspirin EC tablet 81 mg  81 mg Oral Q lunch Loletha Grayer, MD   81 mg at 01/13/16 1130  . budesonide (PULMICORT) nebulizer solution 0.25 mg  0.25 mg Nebulization BID Loletha Grayer, MD   0.25 mg at 01/14/16 0740  . bumetanide (BUMEX) injection 1 mg  1 mg Intravenous Once Loletha Grayer, MD   1 mg at 01/10/16 2000  . bumetanide (BUMEX) tablet 1 mg  1 mg Oral Daily Bettey Costa, MD   1 mg at 01/14/16 1003  . chlorhexidine (PERIDEX) 0.12 % solution 15 mL  15 mL Mouth Rinse QHS Loletha Grayer, MD   15 mL at 01/13/16 2125  . chlorhexidine (PERIDEX) 0.12 % solution 15 mL  15 mL Mouth Rinse BID Loletha Grayer, MD   15 mL at 01/14/16 1005  . citalopram (CELEXA) tablet 20 mg  20 mg Oral QHS Rainey Pines, MD      . doxazosin (CARDURA) tablet 4 mg  4 mg Oral QHS Loletha Grayer, MD   4 mg at 01/13/16 2124  . enoxaparin (LOVENOX)  injection 40 mg  40 mg Subcutaneous Q24H Laverle Hobby, MD   40 mg at 01/14/16 1004  . ezetimibe (ZETIA) tablet 10 mg  10 mg Oral QHS Loletha Grayer, MD   10 mg at 01/13/16 2125  . feeding supplement (ENSURE ENLIVE) (ENSURE ENLIVE) liquid 237 mL  237 mL Oral BID BM Vipul Shah, MD   237 mL at 01/14/16 1005  . fluticasone (FLONASE) 50 MCG/ACT nasal spray 1 spray  1 spray Each Nare BID Loletha Grayer, MD   1 spray at 01/14/16 1005  . gabapentin (NEURONTIN) capsule 100 mg  100 mg Oral BID Loletha Grayer, MD   100 mg at 01/14/16 1003  . hydrALAZINE (APRESOLINE) tablet 10 mg  10 mg Oral Q4H PRN Max Sane, MD   10 mg at 01/12/16 1603  . hydrALAZINE (APRESOLINE) tablet 25 mg  25 mg Oral 3 times per day Max Sane, MD   25 mg at 01/14/16 1610  . HYDROcodone-acetaminophen (NORCO/VICODIN) 5-325 MG per tablet 1 tablet  1 tablet Oral Q4H PRN Loletha Grayer, MD   1 tablet at 01/12/16 0151  . methylPREDNISolone sodium succinate (SOLU-MEDROL) 40 mg/mL injection 40 mg  40 mg Intravenous Q12H Laverle Hobby, MD   40 mg at 01/14/16 1004  . mupirocin ointment (BACTROBAN) 2 % 1 application  1 application Topical Daily PRN Loletha Grayer, MD      . nebivolol (BYSTOLIC) tablet 5 mg  5 mg Oral Daily Pavan Pyreddy, MD   5 mg at 01/14/16 1004  . nitroGLYCERIN (NITROSTAT) SL tablet 0.4 mg  0.4 mg Sublingual Q5 min PRN Loletha Grayer, MD   0.4 mg at 01/12/16 0556  . pantoprazole (PROTONIX) injection 40 mg  40 mg Intravenous Q12H Loletha Grayer, MD   40 mg at 01/14/16 1004  . polyvinyl alcohol (LIQUIFILM TEARS) 1.4 % ophthalmic solution 1 drop  1 drop Both Eyes BID Loletha Grayer, MD   1 drop at 01/14/16 1005  . promethazine (PHENERGAN) tablet 25 mg  25 mg Oral Q8H PRN Loletha Grayer, MD      . sodium chloride flush (NS) 0.9 % injection 3 mL  3 mL Intravenous Q12H Loletha Grayer, MD   3 mL at 01/14/16 1006  . spironolactone (ALDACTONE) tablet 25 mg  25 mg Oral Q M,W,F Loletha Grayer, MD   25 mg at  01/13/16 1003  . tiotropium (SPIRIVA) inhalation capsule 18 mcg  18 mcg Inhalation Daily Loletha Grayer, MD   18 mcg at 01/14/16 1002   Facility-Administered Medications Ordered in Other Encounters  Medication Dose Route Frequency Provider Last Rate Last Dose  . 0.9 %  sodium chloride infusion   Intravenous Once Lequita Asal, MD      . alteplase (CATHFLO ACTIVASE) injection 2 mg  2 mg Intracatheter Once PRN Lequita Asal, MD      . ferumoxytol (FERAHEME) 510 mg in sodium chloride 0.9 % 100 mL IVPB  510 mg Intravenous Once Lequita Asal, MD      . heparin lock flush 100 unit/mL  500 Units Intracatheter Once PRN Lequita Asal, MD      . heparin lock flush 100 unit/mL  250 Units Intracatheter Once PRN Lequita Asal, MD      . sodium chloride 0.9 % injection 10 mL  10 mL Intracatheter PRN Lequita Asal, MD      . sodium chloride 0.9 % injection 3 mL  3 mL Intravenous Once PRN Lequita Asal, MD        Musculoskeletal: Strength & Muscle Tone: decreased Gait & Station: unable to stand Patient leans: Backward  Psychiatric Specialty Exam: Review of Systems  Constitutional: Positive for malaise/fatigue.  HENT: Negative.   Eyes: Negative.   Respiratory: Positive for shortness of breath.   Cardiovascular: Positive for chest pain.  Gastrointestinal: Negative.   Musculoskeletal: Negative.   Skin: Negative.   Neurological: Positive for weakness.  Psychiatric/Behavioral: Positive for depression. Negative for suicidal ideas, hallucinations, memory loss and substance abuse. The patient is nervous/anxious. The patient does not have insomnia.     Blood pressure 168/82, pulse 56, temperature 97.3 F (36.3 C), temperature source Axillary, resp. rate 20, height 5' (1.524 m), weight 164 lb 3.9 oz (74.5 kg), SpO2 100 %.Body mass index is 32.08 kg/(m^2).  General Appearance: Fairly Groomed  Engineer, water::  Minimal  Speech:  Slow  Volume:  Normal  Mood:  Dysphoric   Affect:  Flat and Restricted  Thought Process:  Goal Directed  Orientation:  Full (Time, Place, and Person)  Thought Content:  Negative  Suicidal Thoughts:  No  Homicidal Thoughts:  No  Memory:  Immediate;   Good Recent;   Fair Remote;   Fair  Judgement:  Good  Insight:  Fair  Psychomotor Activity:  Decreased  Concentration:  Fair  Recall:  [employer] of Knowledge:Fair  Language: Fair  Akathisia:  No  Handed:  Right  AIMS (if indicated):     Assets:  Communication Skills Desire for Improvement Financial Resources/Insurance Housing Resilience Social Support  ADL's:  Impaired  Cognition: Impaired,  Mild  Sleep:      Treatment Plan Summary: Discussed her medications with Dr. Margaretmary Eddy as patient seems to have increased SVT as well as decrease in the pulse since her Celexa dose was increased as it increases the risk of increased QT interval and cardiac side effects I will decrease Celexa 20 mg by mouth daily at bedtime I will also decrease Xanax 0.25 mg by mouth twice a day Will continue to monitor She agreed with the plan   Disposition: Patient does not meet criteria for psychiatric inpatient admission. Supportive therapy provided about ongoing stressors.   Thank you for allowing me to participate in the care of this patient     This note was generated in part or whole with voice recognition software. Voice regonition is usually quite accurate but there are transcription errors that can and very often do occur. I apologize for any typographical errors that were not detected and corrected.   Rainey Pines, MD 01/14/2016 12:07 PM

## 2016-01-14 NOTE — Progress Notes (Signed)
Patient reporting anxiety and asking for something to "calm my nerves." Reports she takes xanax sometimes. [facility] Doc paged and orders received.

## 2016-01-15 ENCOUNTER — Inpatient Hospital Stay: Payer: Medicare Other

## 2016-01-15 ENCOUNTER — Inpatient Hospital Stay (HOSPITAL_COMMUNITY)
Admit: 2016-01-15 | Discharge: 2016-01-15 | Disposition: A | Discharge status: Home / Self Care | Payer: Medicare Other | Attending: Internal Medicine | Admitting: Internal Medicine

## 2016-01-15 DIAGNOSIS — I472 Ventricular tachycardia: Secondary | ICD-10-CM

## 2016-01-15 DIAGNOSIS — I471 Supraventricular tachycardia: Secondary | ICD-10-CM

## 2016-01-15 DIAGNOSIS — I509 Heart failure, unspecified: Secondary | ICD-10-CM

## 2016-01-15 LAB — CBC
HEMATOCRIT: 26.7 % — AB (ref 35.0–47.0)
HEMOGLOBIN: 8.4 g/dL — AB (ref 12.0–16.0)
MCH: 25.8 pg — AB (ref 26.0–34.0)
MCHC: 31.6 g/dL — AB (ref 32.0–36.0)
MCV: 81.6 fL (ref 80.0–100.0)
Platelets: 250 10*3/uL (ref 150–440)
RBC: 3.27 MIL/uL — ABNORMAL LOW (ref 3.80–5.20)
RDW: 20.1 % — ABNORMAL HIGH (ref 11.5–14.5)
WBC: 6 10*3/uL (ref 3.6–11.0)

## 2016-01-15 LAB — MAGNESIUM: Magnesium: 1.8 mg/dL (ref 1.7–2.4)

## 2016-01-15 LAB — BASIC METABOLIC PANEL
Anion gap: 7 (ref 5–15)
BUN: 22 mg/dL — AB (ref 6–20)
CHLORIDE: 88 mmol/L — AB (ref 101–111)
CO2: 38 mmol/L — AB (ref 22–32)
CREATININE: 0.45 mg/dL (ref 0.44–1.00)
Calcium: 8.3 mg/dL — ABNORMAL LOW (ref 8.9–10.3)
GFR calc Af Amer: >60 mL/min (ref >60)
GFR calc non Af Amer: >60 mL/min (ref >60)
Glucose, Bld: 237 mg/dL — ABNORMAL HIGH (ref 65–99)
Potassium: 4.2 mmol/L (ref 3.5–5.1)
Sodium: 133 mmol/L — ABNORMAL LOW (ref 135–145)

## 2016-01-15 LAB — BRAIN NATRIURETIC PEPTIDE: B NATRIURETIC PEPTIDE 5: 918 pg/mL — AB (ref 0.0–100.0)

## 2016-01-15 MED ORDER — PREDNISONE 50 MG PO TABS
50.0000 mg | ORAL_TABLET | Freq: Every day | ORAL | Status: DC
  Administered 2016-01-16 – 2016-01-19 (×4): 50 mg via ORAL
  Filled 2016-01-15 (×4): qty 1

## 2016-01-15 NOTE — Progress Notes (Signed)
[facility] - [place] at [facility]   PATIENT NAME: Catherine Holmes    MR#:  [id number]  DATE OF BIRTH:  February 12, 1928  SUBJECTIVE:  Patient off Bipap this morning, feeling better today. Denies any palpitations. Feeling anxious sometimes    REVIEW OF SYSTEMS:    Review of Systems  Constitutional: Negative for fever, chills and malaise/fatigue.  HENT: Negative for ear discharge, ear pain, hearing loss, nosebleeds and sore throat.   Eyes: Negative for blurred vision and pain.  Respiratory: Positive for cough. Negative for hemoptysis, shortness of breath (better) and wheezing.   Cardiovascular: Negative for chest pain, palpitations and leg swelling.  Gastrointestinal: Negative for nausea, vomiting, abdominal pain, diarrhea and blood in stool.  Genitourinary: Negative for dysuria.  Musculoskeletal: Negative for back pain.  Neurological: Negative for dizziness, tremors, speech change, focal weakness, seizures and headaches.  Endo/Heme/Allergies: Does not bruise/bleed easily.  Psychiatric/Behavioral: Negative for depression, suicidal ideas and hallucinations.    Tolerating Diet:yes heart healthy      DRUG ALLERGIES:   Allergies  Allergen Reactions  . Singlet [Chlorphen-Pe-Acetaminophen] Shortness Of Breath  . Atorvastatin Other (See Comments)    Reaction:  Unknown   . Clindamycin Diarrhea and Nausea And Vomiting  . Codeine Other (See Comments)    Reaction:  Unknown   . Doxycycline Hives and Itching  . Epinephrine Hives and Itching  . Erythromycin Hives and Itching  . Fluticasone-Salmeterol Other (See Comments)    Reaction:  Unknown   . Lasix [Furosemide] Itching  . Levofloxacin Other (See Comments)    Reaction:  Unknown   . Penicillins Other (See Comments)    Reaction:  Unknown   . Propoxyphene Hcl Other (See Comments)    Reaction:  Unknown   . Rosuvastatin Other (See Comments)    Reaction:  Unknown   . Simvastatin Other (See Comments)   Reaction:  Unknown   . Sulfamethoxazole-Trimethoprim Other (See Comments)    Reaction:  Unknown   . Teriparatide Other (See Comments)    Reaction:  Unknown   . Tetracyclines & Related Hives and Itching  . Ceclor [Cefaclor] Hives and Other (See Comments)    Reaction:  Altered mental status    . Glipizide Hives and Itching  . Lisinopril Hives and Itching    VITALS:  Blood pressure 185/56, pulse 66, temperature 97.7 F (36.5 C), temperature source Oral, resp. rate 20, height 5' (1.524 m), weight 72.984 kg (160 lb 14.4 oz), SpO2 99 %.  PHYSICAL EXAMINATION:   Physical Exam  Constitutional: She is well-developed, well-nourished, and in no distress. No distress.  HENT:  Head: Normocephalic.  BIPAP  Eyes: No scleral icterus.  Neck: Normal range of motion. Neck supple. No JVD present. No tracheal deviation present.  Cardiovascular: Normal rate and regular rhythm.  Exam reveals no gallop and no friction rub.   Murmur heard. Pulmonary/Chest: Effort normal and breath sounds normal. No respiratory distress. She has no wheezes. She has no rales. She exhibits no tenderness.  Abdominal: Soft. Bowel sounds are normal. She exhibits no distension and no mass. There is no tenderness. There is no rebound and no guarding.  Musculoskeletal: Normal range of motion. She exhibits no edema.  Neurological: She is alert.  Skin: Skin is warm. No rash noted. No erythema.  Psychiatric: Judgment normal.      LABORATORY PANEL:   CBC  Recent Labs Lab 01/15/16 0720  WBC 6.0  HGB 8.4*  HCT 26.7*  PLT 250   ------------------------------------------------------------------------------------------------------------------  Chemistries   Recent Labs Lab 01/10/16 1526  01/15/16 0720  NA 133*  < > 133*  K 3.7  < > 4.2  CL 95*  < > 88*  CO2 32  < > 38*  GLUCOSE 150*  < > 237*  BUN 21*  < > 22*  CREATININE 0.67  < > 0.45  CALCIUM 8.2*  < > 8.3*  MG  --   < > 1.8  AST 19  --   --   ALT 21  --    --   ALKPHOS 67  --   --   BILITOT 0.4  --   --   < > = values in this interval not displayed. ------------------------------------------------------------------------------------------------------------------  Cardiac Enzymes  Recent Labs Lab 01/12/16 0442 01/12/16 1106 01/12/16 1551  TROPONINI <0.03 <0.03 <0.03   ------------------------------------------------------------------------------------------------------------------  RADIOLOGY:  No results found.   ASSESSMENT AND PLAN:   82 year old female who presents with acute hypoxic respiratory failure due to COPD exacerbation and diastolic heart failure.  # Abnml cardiac rhythm with aberrancy  Continue monitoring on telemetry Follow-up with cardiology , d/w - dr.Harding yesterday repleted magnesium . Magnesium at 1.8 today Amiodarone 150 iv once given yesterday  Decreased Celexa and xanax doses as they could cause QT prolongation.  Appreciate cardio and psych recommendations   # Acute hypoxic respiratory failure:  BiPAP is being weaned off. on mouth Bumex Taper IV steroids to by mouth prednisone Continue nebulizer treatment.  2. Acute on chronic diastolic heart failure: Continue Bumex. Monitor I/O. Continue Aldactone   3. Acute COPD exacerbation with improvement: Continue plan as outlined above.  4. Essential hypertension: Continue hydralazine, Bystolic, Aldactone, Cardura,  5. Depression: Continue Celexa appreciate psychiatry consult   6. PAF: Continue aspirin. No anticoagulation due to history of falls   continue Bystolic for heart rate control.   8. Hyponatremia: improving Recheck in am  9. Acute blood loss anemia: Patient is status post 1 unit PRBC. Hemoglobin at admission was 7.2. Hemoglobin remained stable. Suspect anemia may have driven diastolic heart failure.  10. Deconditioning with physical therapy. Full code  TOTALTIME TAKING CARE OF THIS PATIENT: 35 minutes.     PT consult for  disposition.  POSSIBLE 1-2, DEPENDING ON CLINICAL CONDITION.   Ramonita LabGouru, Catherine Holmes M.D on 01/15/2016 at 12:27 PM  Between 7am to 6pm - Pager - [phone] After 6pm go to [email or web address] - password [email or web address] [facility]  [facility]  Office  [phone]  CC: Primary care physician; Marisue IvanLINTHAVONG, KANHKA, MD  Note: This dictation was prepared with Dragon dictation along with smaller phrase technology. Any transcriptional errors that result from this process are unintentional.

## 2016-01-15 NOTE — Progress Notes (Signed)
MD, Anne HahnWillis aware of pt's 13 beats run of v-tach on shift. Pt is asymptomatic, will continue to monitor.

## 2016-01-15 NOTE — Progress Notes (Signed)
Patient resting quietly today. Daughter at bedside part of the day. No complaints of pain; nausea improved with PRN medication. Alert and oriented. Will continue to monitor.

## 2016-01-15 NOTE — Consult Note (Signed)
CARDIOLOGY CONSULT NOTE  Patient ID: Catherine Holmes, MRN: [id number], DOB/AGE: 1928-07-13 82 y.o. Admit date: 01/10/2016 Date of Consult: 01/15/2016  Primary Physician: Marisue Ivan, MD Primary Cardiologist: Coralyn Pear Consulting Physician [place]  Chief Complaint: SVT andVT NS   HPI Catherine Holmes is a 82 y.o. female  With a history of diastolic heart failure and pulmonary hypertension atrial fibrillation with prior stroke and a CHADS-VASc score greater than or equal to 8, but not felt to be a candidate for anticoagulation because of falling, who was admitted to hospital 3/21 with complaints of shortness of breath. She also has known COPD an exacerbation of which was felt to be the trigger .  BNP also elevated   On telemetry yesterday she was noted to have nonsustained ventricular tachycardia and nonsustained atrial tachycardia. She was treated with amiodarone.  It is not clear to me whether she has symptoms of her arrhythmia  She has this history of significant hypertension and has been treated with a complex regime including hydralazine Bystolic Aldactone and Cardura. She also has a history of peripheral vascular disease with carotid disease as well as lower extremity stenting with moderate renal artery stenosis  She gaits much of her problem back in September when she was admitted with pneumonia and sepsis and underwent chest compressions sternal fracture. She describes significant dyspnea since that time been oxygen requiring.  She is markedly limited baseline at less than about 15 feet;  She has recurrent chest discomfort; she takes nitroglycerin for this. Myoview scan 2012 had demonstrated no evidence of ischemia Echocardiogram 1/15 demonstrated normal LV function     Past Medical History  Diagnosis Date  . Essential hypertension   . Diabetes mellitus type II, controlled (HCC)   . COPD (chronic obstructive pulmonary disease) (HCC)   . Osteoporosis   . Carotid  arterial disease (HCC)     a. 2012 Carotid dopplers: 40-59% R ICA, 60-79% L ICA  . Tic douloureux   . Arthralgia   . Sjoegren syndrome (HCC)   . TIA (transient ischemic attack)   . Emphysema   . MVP (mitral valve prolapse)   . Chronic diastolic CHF (congestive heart failure) (HCC)     a. 08/2011 Echo: EF 65-70%, mild LVH, mod dil LA, mildly dil RA, mild MR, mild-mod AoV Sclerosis w/o stenosis, mod-sev elev PA pressures, mild TR.  Marland Kitchen Acid reflux   . Diverticulitis   . Anemia   . History of pneumonia   . Syncope and collapse   . Anemia   . Hip fracture, left (HCC)   . Hyperlipidemia   . Hiatal hernia   . Chest pain     a. 08/2011 MV: EF 74%, no ischemia.  . Noncompliance     a. h/o not taking bumex as Rx.  Marland Kitchen PAF (paroxysmal atrial fibrillation) (HCC)     a. CHA2DS2VASc = 8 ->No OAC 2/2 falls.      Surgical History:  Past Surgical History  Procedure Laterality Date  . Cholecystectomy  1965  . Abdominal hysterectomy  1964  . Cataract extraction Bilateral   . Hip fracture surgery  2014    left hip     Home Meds: Prior to Admission medications   Medication Sig Start Date End Date Taking? Authorizing Provider  albuterol (PROVENTIL) (2.5 MG/3ML) 0.083% nebulizer solution Take 2.5 mg by nebulization every 6 (six) hours as needed for wheezing or shortness of breath.    Yes Historical Provider, MD  ALPRAZolam (XANAX) 0.25 MG tablet Take 1 tablet (0.25 mg total) by mouth 3 (three) times daily as needed for anxiety. 11/21/15  Yes Srikar Sudini, MD  aspirin EC 81 MG tablet Take 81 mg by mouth daily with lunch.    Yes Historical Provider, MD  bumetanide (BUMEX) 1 MG tablet Take 1-2 mg by mouth 2 (two) times daily as needed (for edema).   Yes Historical Provider, MD  calcium carbonate (OSCAL) 1500 (600 Ca) MG TABS tablet Take 600 mg of elemental calcium by mouth daily with breakfast.   Yes Historical Provider, MD  chlorhexidine (PERIDEX) 0.12 % solution 15 mLs by Mouth Rinse route at  bedtime.    Yes Historical Provider, MD  citalopram (CELEXA) 20 MG tablet Take 20 mg by mouth daily with lunch.    Yes Historical Provider, MD  diphenhydramine-acetaminophen (TYLENOL PM) 25-500 MG TABS tablet Take 1 tablet by mouth at bedtime as needed (for sleep).   Yes Historical Provider, MD  doxazosin (CARDURA) 4 MG tablet Take 4 mg by mouth at bedtime.    Yes Antonieta Iba, MD  ezetimibe (ZETIA) 10 MG tablet Take 10 mg by mouth at bedtime.    Yes Historical Provider, MD  fluticasone (FLONASE) 50 MCG/ACT nasal spray Place 1 spray into both nostrils 2 (two) times daily.    Yes Historical Provider, MD  gabapentin (NEURONTIN) 100 MG capsule Take 100 mg by mouth 2 (two) times daily.    Yes Historical Provider, MD  guaiFENesin-dextromethorphan (ROBITUSSIN DM) 100-10 MG/5ML syrup Take 5 mLs by mouth every 4 (four) hours as needed for cough. 11/21/15  Yes Srikar Sudini, MD  HYDROcodone-acetaminophen (NORCO/VICODIN) 5-325 MG tablet Take 1 tablet by mouth every 4 (four) hours as needed for moderate pain. 11/21/15  Yes Srikar Sudini, MD  mometasone-formoterol (DULERA) 100-5 MCG/ACT AERO Inhale 2 puffs into the lungs 2 (two) times daily.   Yes Historical Provider, MD  mupirocin ointment (BACTROBAN) 2 % Apply 1 application topically daily as needed (for sores on leg).    Yes Historical Provider, MD  nebivolol (BYSTOLIC) 5 MG tablet Take 1 tablet (5 mg total) by mouth daily. 04/03/15  Yes Adrian Saran, MD  nitroGLYCERIN (NITROSTAT) 0.4 MG SL tablet Place 0.4 mg under the tongue every 5 (five) minutes as needed for chest pain.   Yes Historical Provider, MD  Polyethyl Glycol-Propyl Glycol (SYSTANE OP) Place 1 drop into both eyes 2 (two) times daily.   Yes Historical Provider, MD  promethazine (PHENERGAN) 25 MG tablet Take 25 mg by mouth every 8 (eight) hours as needed for nausea or vomiting.    Yes Historical Provider, MD  SPIRIVA RESPIMAT 1.25 MCG/ACT AERS Inhale 1 puff into the lungs daily.   Yes Historical  Provider, MD  spironolactone (ALDACTONE) 25 MG tablet Take 25 mg by mouth every Monday, Wednesday, and Friday.   Yes Historical Provider, MD    Inpatient Medications:  . sodium chloride  10 mL/hr Intravenous Once  . ALPRAZolam  0.25 mg Oral BID  . antiseptic oral rinse  7 mL Mouth Rinse q12n4p  . aspirin EC  81 mg Oral Q lunch  . budesonide (PULMICORT) nebulizer solution  0.25 mg Nebulization BID  . bumetanide (BUMEX) IV  1 mg Intravenous Once  . bumetanide  1 mg Oral Daily  . chlorhexidine  15 mL Mouth Rinse QHS  . chlorhexidine  15 mL Mouth Rinse BID  . citalopram  20 mg Oral QHS  . doxazosin  4 mg Oral QHS  .  enoxaparin (LOVENOX) injection  40 mg Subcutaneous Q24H  . ezetimibe  10 mg Oral QHS  . feeding supplement (ENSURE ENLIVE)  237 mL Oral BID BM  . fluticasone  1 spray Each Nare BID  . gabapentin  100 mg Oral BID  . hydrALAZINE  25 mg Oral 3 times per day  . methylPREDNISolone (SOLU-MEDROL) injection  40 mg Intravenous Q12H  . nebivolol  5 mg Oral Daily  . pantoprazole (PROTONIX) IV  40 mg Intravenous Q12H  . polyvinyl alcohol  1 drop Both Eyes BID  . sodium chloride flush  3 mL Intravenous Q12H  . spironolactone  25 mg Oral Q M,W,F  . tiotropium  18 mcg Inhalation Daily    Allergies:  Allergies  Allergen Reactions  . Singlet [Chlorphen-Pe-Acetaminophen] Shortness Of Breath  . Atorvastatin Other (See Comments)    Reaction:  Unknown   . Clindamycin Diarrhea and Nausea And Vomiting  . Codeine Other (See Comments)    Reaction:  Unknown   . Doxycycline Hives and Itching  . Epinephrine Hives and Itching  . Erythromycin Hives and Itching  . Fluticasone-Salmeterol Other (See Comments)    Reaction:  Unknown   . Lasix [Furosemide] Itching  . Levofloxacin Other (See Comments)    Reaction:  Unknown   . Penicillins Other (See Comments)    Reaction:  Unknown   . Propoxyphene Hcl Other (See Comments)    Reaction:  Unknown   . Rosuvastatin Other (See Comments)     Reaction:  Unknown   . Simvastatin Other (See Comments)    Reaction:  Unknown   . Sulfamethoxazole-Trimethoprim Other (See Comments)    Reaction:  Unknown   . Teriparatide Other (See Comments)    Reaction:  Unknown   . Tetracyclines & Related Hives and Itching  . Ceclor [Cefaclor] Hives and Other (See Comments)    Reaction:  Altered mental status    . Glipizide Hives and Itching  . Lisinopril Hives and Itching    Social History   Social History  . Marital Status: Divorced    Spouse Name: N/A  . Number of Children: 2  . Years of Education: N/A   Occupational History  . Retired     former IT sales professional   Social History Main Topics  . Smoking status: Former Smoker -- 0.50 packs/day for 30 years    Types: Cigarettes    Quit date: 10/22/1994  . Smokeless tobacco: Never Used  . Alcohol Use: Yes  . Drug Use: No  . Sexual Activity: Not on file   Other Topics Concern  . Not on file   Social History Narrative     Family History  Problem Relation Age of Onset  . Heart attack Mother   . Heart attack Father   . Heart attack Sister      ROS:  Please see the history of present illness.     All other systems reviewed and negative.    Physical Exam: Blood pressure 136/55, pulse 60, temperature 98.6 F (37 C), temperature source Oral, resp. rate 18, height 5' (1.524 m), weight 160 lb 14.4 oz (72.984 kg), SpO2 100 %. General: Well developed, well nourished female in no acute distress. Head: Normocephalic, atraumatic, sclera non-icteric, no xanthomas, nares are without discharge. EENT: normal  Lymph Nodes:  none  8 not elevated. Back:with marked kyphosis  Lungs: Clear bilaterally to auscultation without wheezes, rales, or rhonchi. Breathing is unlabored. Heart: RRR with S1 S2.  2/6 systolic  murmur .  No rubs, or gallops appreciated. Abdomen: Soft, non-tender, non-distended with normoactive bowel sounds. No hepatomegaly. No rebound/guarding. No obvious abdominal  masses. Msk:  Strength and tone appear normal for age. Extremities: No clubbing or cyanosis. tr edema.  Distal pedal pulses are 2+ and equal bilaterally. Skin: Warm and Dry Neuro: Alert and oriented X 3. CN III-XII intact Grossly normal sensory and motor function . Psych:  Responds to questions appropriately with a normal affect.      Labs: Cardiac Enzymes  Recent Labs  01/12/16 1551  TROPONINI <0.03   CBC Lab Results  Component Value Date   WBC 6.0 01/15/2016   HGB 8.4* 01/15/2016   HCT 26.7* 01/15/2016   MCV 81.6 01/15/2016   PLT 250 01/15/2016   PROTIME: No results for input(s): LABPROT, INR in the last 72 hours. Chemistry  Recent Labs Lab 01/10/16 1526  01/15/16 0720  NA 133*  < > 133*  K 3.7  < > 4.2  CL 95*  < > 88*  CO2 32  < > 38*  BUN 21*  < > 22*  CREATININE 0.67  < > 0.45  CALCIUM 8.2*  < > 8.3*  PROT 5.4*  --   --   BILITOT 0.4  --   --   ALKPHOS 67  --   --   ALT 21  --   --   AST 19  --   --   GLUCOSE 150*  < > 237*  < > = values in this interval not displayed. Lipids No results found for: CHOL, HDL, LDLCALC, TRIG BNP No results found for: PROBNP Thyroid Function Tests: No results for input(s): TSH, T4TOTAL, T3FREE, THYROIDAB in the last 72 hours.  Invalid input(s): FREET3 Miscellaneous No results found for: DDIMER  Radiology/Studies:  Dg Chest 1 View  01/13/2016  CLINICAL DATA:  Respiratory failure EXAM: CHEST 1 VIEW COMPARISON:  01/10/2016 FINDINGS: Vascular congestion and interstitial edema have improved. Hazy opacity at the right base likely a combination of pleural fluid and volume loss is stable. Stable hiatal hernia. Cardiomegaly. No pneumothorax. IMPRESSION: Improved vascular congestion and edema. Stable right basilar opacity. Electronically Signed   By: Jolaine ClickArthur  Hoss M.D.   On: 01/13/2016 08:48   Dg Chest 2 View  12/27/2015  CLINICAL DATA:  Nonproductive cough x1 week, wheezing EXAM: CHEST  2 VIEW COMPARISON:  12/05/2015 FINDINGS:  Mild patchy right lower lobe opacity. No pleural effusion or pneumothorax. Heart is top-normal in size. Moderate to large hiatal hernia. IMPRESSION: Mild patchy right lower lobe opacity, suspicious for pneumonia. Electronically Signed   By: Charline BillsSriyesh  Krishnan M.D.   On: 12/27/2015 18:35   Dg Chest Portable 1 View  01/10/2016  CLINICAL DATA:  Unresponsive EXAM: PORTABLE CHEST 1 VIEW COMPARISON:  12/26/2005 FINDINGS: Stable enlarged cardiac silhouette. Large hiatal hernia again noted. Decrease in lung volumes compared to prior. Mild interstitial edema pattern noted. No focal infiltrate. No pneumothorax. No overt pulmonary edema. IMPRESSION: 1. Decrease in lung volumes compared to prior. 2. Mild interstitial edema pattern. 3. Large hiatal hernia and cardiomegaly. Electronically Signed   By: Genevive BiStewart  Edmunds M.D.   On: 01/10/2016 15:42    ECG: Sinus Rhyth  @71             Intervals  15/09/43  Axis prwp      Assessment and Plan:  Atrial tach non sustained  VT Nonsustained  Acute resp failure  COPD exacerbation  A/C HFpEF   Anemia     With her  history of chest pain yesterday, we will reassess cardiac enzymes.  We will repeat BNP.  Ongoing dyspnea--will repeat CXR   I would discontinue amiodarone; the nonsustained atrial arrhythmia and nonsustained ventricular tachycardia  of themselves do not prompt  need for antiarrhythmic therapy.  We will reassess left ventricular function with an echocardiogram.

## 2016-01-15 NOTE — Progress Notes (Signed)
Pt has refused schedule hydralazine on shift. Pt states " I don't think I need this medicine, I only take this medicine when my blood pressure is not under control"

## 2016-01-16 DIAGNOSIS — D649 Anemia, unspecified: Secondary | ICD-10-CM

## 2016-01-16 DIAGNOSIS — I272 Other secondary pulmonary hypertension: Secondary | ICD-10-CM

## 2016-01-16 DIAGNOSIS — F320 Major depressive disorder, single episode, mild: Secondary | ICD-10-CM

## 2016-01-16 DIAGNOSIS — R0602 Shortness of breath: Secondary | ICD-10-CM

## 2016-01-16 DIAGNOSIS — I5033 Acute on chronic diastolic (congestive) heart failure: Secondary | ICD-10-CM

## 2016-01-16 LAB — CBC
HEMATOCRIT: 26.4 % — AB (ref 35.0–47.0)
HEMOGLOBIN: 8.4 g/dL — AB (ref 12.0–16.0)
MCH: 25.8 pg — ABNORMAL LOW (ref 26.0–34.0)
MCHC: 32 g/dL (ref 32.0–36.0)
MCV: 80.7 fL (ref 80.0–100.0)
Platelets: 240 10*3/uL (ref 150–440)
RBC: 3.27 MIL/uL — ABNORMAL LOW (ref 3.80–5.20)
RDW: 20.7 % — AB (ref 11.5–14.5)
WBC: 6.4 10*3/uL (ref 3.6–11.0)

## 2016-01-16 LAB — BASIC METABOLIC PANEL
ANION GAP: 7 (ref 5–15)
BUN: 23 mg/dL — ABNORMAL HIGH (ref 6–20)
CALCIUM: 8.1 mg/dL — AB (ref 8.9–10.3)
CO2: 39 mmol/L — ABNORMAL HIGH (ref 22–32)
Chloride: 88 mmol/L — ABNORMAL LOW (ref 101–111)
Creatinine, Ser: 0.52 mg/dL (ref 0.44–1.00)
GLUCOSE: 159 mg/dL — AB (ref 65–99)
Potassium: 3.7 mmol/L (ref 3.5–5.1)
SODIUM: 134 mmol/L — AB (ref 135–145)

## 2016-01-16 LAB — ECHOCARDIOGRAM COMPLETE
Height: 60 in
Weight: 2574.4 oz

## 2016-01-16 LAB — MAGNESIUM: MAGNESIUM: 1.6 mg/dL — AB (ref 1.7–2.4)

## 2016-01-16 MED ORDER — MAGNESIUM SULFATE 4 GM/100ML IV SOLN
4.0000 g | Freq: Once | INTRAVENOUS | Status: AC
  Administered 2016-01-16: 4 g via INTRAVENOUS
  Filled 2016-01-16: qty 100

## 2016-01-16 MED ORDER — BUMETANIDE 0.25 MG/ML IJ SOLN
1.0000 mg | INTRAMUSCULAR | Status: DC
  Administered 2016-01-16 – 2016-01-19 (×10): 1 mg via INTRAVENOUS
  Filled 2016-01-16 (×9): qty 4

## 2016-01-16 NOTE — Progress Notes (Signed)
PATIENT NAME: Catherine Holmes    MR#:  [id number]  DATE OF BIRTH:  1928-02-11  SUBJECTIVE:    Patient feels short of breath this morning  Reports no significant improvement through her hospital course  Talked with family at the bedside concerning her presentation  Several episodes of near syncope, syncope (" turning blue') Seen by pulmonary and primary care outpatient, was given antibiotics, prednisone for possible lung infection  Did not seem to improve her symptoms  Some hemoptysis this morning  REVIEW OF SYSTEMS:    Review of Systems  Constitutional: Negative for fever, chills and malaise/fatigue.  HENT: Negative for ear discharge, ear pain, hearing loss, nosebleeds and sore throat.   Eyes: Negative for blurred vision and pain.  Respiratory: Positive for cough, hemoptysis and shortness of breath (better). Negative for wheezing.   Cardiovascular: Negative for chest pain, palpitations and leg swelling.  Gastrointestinal: Negative for nausea, vomiting, abdominal pain, diarrhea and blood in stool.  Genitourinary: Negative for dysuria.  Musculoskeletal: Negative for back pain.  Neurological: Negative for dizziness, tremors, speech change, focal weakness, seizures and headaches.  Endo/Heme/Allergies: Does not bruise/bleed easily.  Psychiatric/Behavioral: Negative for depression, suicidal ideas and hallucinations.     DRUG ALLERGIES:   Allergies  Allergen Reactions  . Singlet [Chlorphen-Pe-Acetaminophen] Shortness Of Breath  . Atorvastatin Other (See Comments)    Reaction:  Unknown   . Clindamycin Diarrhea and Nausea And Vomiting  . Codeine Other (See Comments)    Reaction:  Unknown   . Doxycycline Hives and Itching  . Epinephrine Hives and Itching  . Erythromycin Hives and Itching  . Fluticasone-Salmeterol Other (See Comments)    Reaction:  Unknown   . Lasix [Furosemide] Itching  . Levofloxacin Other (See Comments)    Reaction:  Unknown   . Penicillins Other (See Comments)     Reaction:  Unknown   . Propoxyphene Hcl Other (See Comments)    Reaction:  Unknown   . Rosuvastatin Other (See Comments)    Reaction:  Unknown   . Simvastatin Other (See Comments)    Reaction:  Unknown   . Sulfamethoxazole-Trimethoprim Other (See Comments)    Reaction:  Unknown   . Teriparatide Other (See Comments)    Reaction:  Unknown   . Tetracyclines & Related Hives and Itching  . Ceclor [Cefaclor] Hives and Other (See Comments)    Reaction:  Altered mental status    . Glipizide Hives and Itching  . Lisinopril Hives and Itching    VITALS:  Blood pressure 174/50, pulse 59, temperature 97.6 F (36.4 C), temperature source Oral, resp. rate 18, height 5' (1.524 m), weight 160 lb 14.4 oz (72.984 kg), SpO2 100 %.  PHYSICAL EXAMINATION:   Physical Exam  Constitutional: She is well-developed, well-nourished, and in no distress. No distress.  HENT:  Head: Normocephalic.  Eyes: No scleral icterus.  Neck: Normal range of motion. Neck supple. No JVD present. No tracheal deviation present.  Cardiovascular: Normal rate and regular rhythm.  Exam reveals no gallop and no friction rub.   Murmur heard. Pulmonary/Chest: Effort normal. No respiratory distress. She has decreased breath sounds. She has no wheezes. She has no rales. She exhibits no tenderness.  Abdominal: Soft. Bowel sounds are normal. She exhibits no distension and no mass. There is no tenderness. There is no rebound and no guarding.  Musculoskeletal: Normal range of motion. She exhibits no edema.  Neurological: She is alert.  Skin: Skin is warm. No rash noted. No erythema.  Psychiatric: Judgment normal.  LABORATORY PANEL:   CBC  Recent Labs Lab 01/16/16 0711  WBC 6.4  HGB 8.4*  HCT 26.4*  PLT 240   ------------------------------------------------------------------------------------------------------------------  Chemistries   Recent Labs Lab 01/10/16 1526  01/16/16 0711  NA 133*  < > 134*  K 3.7   < > 3.7  CL 95*  < > 88*  CO2 32  < > 39*  GLUCOSE 150*  < > 159*  BUN 21*  < > 23*  CREATININE 0.67  < > 0.52  CALCIUM 8.2*  < > 8.1*  MG  --   < > 1.6*  AST 19  --   --   ALT 21  --   --   ALKPHOS 67  --   --   BILITOT 0.4  --   --   < > = values in this interval not displayed. ------------------------------------------------------------------------------------------------------------------  Cardiac Enzymes  Recent Labs Lab 01/12/16 0442 01/12/16 1106 01/12/16 1551  TROPONINI <0.03 <0.03 <0.03   ------------------------------------------------------------------------------------------------------------------  RADIOLOGY:  Dg Chest 2 View  01/15/2016  CLINICAL DATA:  Acute hypoxic respiratory failure. COPD exacerbation. Heart failure. EXAM: CHEST  2 VIEW COMPARISON:  01/13/2016.  Also 01/10/2016 and 12/27/2015 FINDINGS: Cardiomegaly. Lower lobe airspace opacity with BILATERAL effusions as well as generalized vascular congestion favored to represent early or resolving pulmonary edema. Hiatal hernia. Mid thoracic compression fracture, unchanged from 12/27/2015. IMPRESSION: Cardiomegaly. Early or resolving pulmonary edema. Worsening aeration compared with priors. Electronically Signed   By: Elsie StainJohn T Curnes M.D.   On: 01/15/2016 14:51     ASSESSMENT AND PLAN:   82 year old female who presents with acute hypoxic respiratory failure due to COPD exacerbation and diastolic heart failure.  --Acute on chronic diastolic CHF  Severe pulmonary hypertension on echocardiogram, higher than her baseline  This suggests much of her breathing could be secondary to fluid overload  -She does have prior hospital admissions for acute on chronic diastolic CHF from noncompliance with her outpatient diuretic regiment, eats out frequently .  Would recommend changing oral Bumex to IV Bumex several times per day given such elevated right heart pressures . Elevated right heart pressures likely also  responsible for hemoptysis  May need Foley catheter  --Pulmonary hypertension  In conjunction with acute on chronic diastolic CHF  Needs aggressive diuresis, close monitoring of renal function daily  This is not a new issue, worsening of a chronic issue  --- COPD  Appears relatively stable  Just completed prednisone, antibiotics as outpatient   --- Arrhythmia  Seen by Dr. Graciela HusbandsKlein over the weekend, recommendation has been made to stop amiodarone  Would monitor for now   -- Acute hypoxic respiratory failure:  I suspect some of her syncope and near-syncope episodes is secondary to hypoxia  Episodes of hypoxia secondary to severe high right-sided pressures  Diuretic change as above    Essential hypertension:  Continue hydralazine, Bystolic, Aldactone, Cardura,   5. Depression:  Seen by psychiatry   6. PAF:  Continue aspirin. No anticoagulation due to history of falls  Bystolic for heart rate control.   9. Acute blood loss anemia:  Patient is status post 1 unit PRBC.  Blood count remains low, may need additional unit of blood to reach hematocrit 30  10. Deconditioning with physical therapy. Full code    Total encounter time more than 35 minutes  Greater than 50% was spent in counseling and coordination of care with the patient     PT consult for disposition.  POSSIBLE 1-2, DEPENDING  ON CLINICAL CONDITION.   Julien Nordmann M.D on 01/16/2016 at 10:28 AM  Between 7am to 6pm - Pager - [phone] After 6pm go to [email or web address] - password [email or web address] [facility]  [facility]  Office  [phone]  CC: Primary care physician; Marisue Ivan, MD  Note: This dictation was prepared with Dragon dictation along with smaller phrase technology. Any transcriptional errors that result from this process are unintentional.

## 2016-01-16 NOTE — Care Management (Signed)
Informed that patient would not meet criteria for [facility] but meets [facility]'s criteria.  Discussed option with attending.  Spoke with daughter Gavin PoundDeborah and is willing to speak with representative of [facility].  She does have concerns regarding location and neighborhood of [facility].

## 2016-01-16 NOTE — Care Management (Signed)
Patient has declined to participate with physical therapy x 3.  will continue to anticipate discharge home with [facility] at the very least.  Obtained approval for [facility] from physician advisor

## 2016-01-16 NOTE — Progress Notes (Signed)
[facility] - [place] at [facility]   PATIENT NAME: Catherine Holmes    MR#:  [id number]  DATE OF BIRTH:  Jul 28, 1928  SUBJECTIVE:    Pt is  not feeling well. Short of breath still, intermittent episodes of hemoptysis.Feeling tired and weak. Frustrated that she is not getting better  REVIEW OF SYSTEMS:    Review of Systems  Constitutional: Negative for fever, chills and malaise/fatigue.  HENT: Negative for ear discharge, ear pain, hearing loss, nosebleeds and sore throat.   Eyes: Negative for blurred vision and pain.  Respiratory: Positive for cough. Negative for hemoptysis, shortness of breath (better) and wheezing.   Cardiovascular: Negative for chest pain, palpitations and leg swelling.  Gastrointestinal: Negative for nausea, vomiting, abdominal pain, diarrhea and blood in stool.  Genitourinary: Negative for dysuria.  Musculoskeletal: Negative for back pain.  Neurological: Negative for dizziness, tremors, speech change, focal weakness, seizures and headaches.  Endo/Heme/Allergies: Does not bruise/bleed easily.  Psychiatric/Behavioral: Negative for depression, suicidal ideas and hallucinations.    Tolerating Diet:yes heart healthy      DRUG ALLERGIES:   Allergies  Allergen Reactions  . Singlet [Chlorphen-Pe-Acetaminophen] Shortness Of Breath  . Atorvastatin Other (See Comments)    Reaction:  Unknown   . Clindamycin Diarrhea and Nausea And Vomiting  . Codeine Other (See Comments)    Reaction:  Unknown   . Doxycycline Hives and Itching  . Epinephrine Hives and Itching  . Erythromycin Hives and Itching  . Fluticasone-Salmeterol Other (See Comments)    Reaction:  Unknown   . Lasix [Furosemide] Itching  . Levofloxacin Other (See Comments)    Reaction:  Unknown   . Penicillins Other (See Comments)    Reaction:  Unknown   . Propoxyphene Hcl Other (See Comments)    Reaction:  Unknown   . Rosuvastatin Other (See Comments)    Reaction:  Unknown   .  Simvastatin Other (See Comments)    Reaction:  Unknown   . Sulfamethoxazole-Trimethoprim Other (See Comments)    Reaction:  Unknown   . Teriparatide Other (See Comments)    Reaction:  Unknown   . Tetracyclines & Related Hives and Itching  . Ceclor [Cefaclor] Hives and Other (See Comments)    Reaction:  Altered mental status    . Glipizide Hives and Itching  . Lisinopril Hives and Itching    VITALS:  Blood pressure 136/45, pulse 78, temperature 97.9 F (36.6 C), temperature source Oral, resp. rate 18, height 5' (1.524 m), weight 72.984 kg (160 lb 14.4 oz), SpO2 88 %.  PHYSICAL EXAMINATION:   Physical Exam  Constitutional: She is well-developed, well-nourished, and in no distress. No distress.  HENT:  Head: Normocephalic.  BIPAP  Eyes: No scleral icterus.  Neck: Normal range of motion. Neck supple. No JVD present. No tracheal deviation present.  Cardiovascular: Normal rate and regular rhythm.  Exam reveals no gallop and no friction rub.   Murmur heard. Pulmonary/Chest: Effort normal and breath sounds normal. No respiratory distress. She has no wheezes. She has no rales. She exhibits no tenderness.  Abdominal: Soft. Bowel sounds are normal. She exhibits no distension and no mass. There is no tenderness. There is no rebound and no guarding.  Musculoskeletal: Normal range of motion. She exhibits no edema.  Neurological: She is alert.  Skin: Skin is warm. No rash noted. No erythema.  Psychiatric: Judgment normal.      LABORATORY PANEL:   CBC  Recent Labs Lab 01/16/16 0711  WBC 6.4  HGB 8.4*  HCT 26.4*  PLT 240   ------------------------------------------------------------------------------------------------------------------  Chemistries   Recent Labs Lab 01/10/16 1526  01/16/16 0711  NA 133*  < > 134*  K 3.7  < > 3.7  CL 95*  < > 88*  CO2 32  < > 39*  GLUCOSE 150*  < > 159*  BUN 21*  < > 23*  CREATININE 0.67  < > 0.52  CALCIUM 8.2*  < > 8.1*  MG  --   < >  1.6*  AST 19  --   --   ALT 21  --   --   ALKPHOS 67  --   --   BILITOT 0.4  --   --   < > = values in this interval not displayed. ------------------------------------------------------------------------------------------------------------------  Cardiac Enzymes  Recent Labs Lab 01/12/16 0442 01/12/16 1106 01/12/16 1551  TROPONINI <0.03 <0.03 <0.03   ------------------------------------------------------------------------------------------------------------------  RADIOLOGY:  Dg Chest 2 View  01/15/2016  CLINICAL DATA:  Acute hypoxic respiratory failure. COPD exacerbation. Heart failure. EXAM: CHEST  2 VIEW COMPARISON:  01/13/2016.  Also 01/10/2016 and 12/27/2015 FINDINGS: Cardiomegaly. Lower lobe airspace opacity with BILATERAL effusions as well as generalized vascular congestion favored to represent early or resolving pulmonary edema. Hiatal hernia. Mid thoracic compression fracture, unchanged from 12/27/2015. IMPRESSION: Cardiomegaly. Early or resolving pulmonary edema. Worsening aeration compared with priors. Electronically Signed   By: Elsie StainJohn T Curnes M.D.   On: 01/15/2016 14:51     ASSESSMENT AND PLAN:   82 year old female who presents with acute hypoxic respiratory failure due to COPD exacerbation and diastolic heart failure.  #Acute on chronic diastolic CHF with severe pulmonary hypertension on echo/with fluid overload Which could be the etiology for her shortness of breath Of note patient is noncompliant with her diurettic regimen and diet Patient is started on Bumex IV with strict monitoring of intake and output with Foley catheter Pulmonary hypertension might be the etiology of her hemoptysis too Monitor renal function closely. Appreciate cardiology recommendations  # Abnml cardiac rhythm with aberrancy  Continue monitoring on telemetry repleted magnesium . Magnesium at 1.5 today. Repleted magnesium sulfate and check magnesium Amiodarone 150 iv once given yesterday   Decreased Celexa and xanax doses as they could cause QT prolongation.  Appreciate cardio and psych recommendations   # Acute hypoxic respiratory failure:  BiPAP is being weaned off. Tapered IV steroids to by mouth prednisone Continue nebulizer treatment.   3. Acute COPD exacerbation with improvement: Continue plan as outlined above.  4. Essential hypertension: Continue hydralazine, Bystolic, Aldactone, Cardura,  5. Depression: Continue Celexa appreciate psychiatry consult   6. PAF: Continue aspirin. No anticoagulation due to history of falls   continue Bystolic for heart rate control.   8. Hyponatremia: improving Recheck in am  9. Acute blood loss anemia: Patient is status post 1 unit PRBC. Hemoglobin at admission was 7.2. Hemoglobin remained stable. Suspect anemia may have driven diastolic heart failure.  10. Deconditioning with physical therapy.-Pending Full code  TOTALTIME TAKING CARE OF THIS PATIENT: 35 minutes.     PT consult for disposition.  POSSIBLE 1-2, DEPENDING ON CLINICAL CONDITION.   Ramonita LabGouru, Jhanvi Drakeford M.D on 01/16/2016 at 2:05 PM  Between 7am to 6pm - Pager - [phone] After 6pm go to [email or web address] - password [email or web address] [facility]  [facility]  Office  [phone]  CC: Primary care physician; Marisue IvanLINTHAVONG, KANHKA, MD  Note: This dictation was prepared with Dragon dictation along with smaller phrase technology. Any transcriptional errors that result from  this process are unintentional.

## 2016-01-16 NOTE — Progress Notes (Signed)
Patient refused hydralazine this morning will continue to monitor.

## 2016-01-16 NOTE — Progress Notes (Signed)
PT Cancellation Note  Patient Details Name: Catherine AmassHelen K Dicicco MRN: [id number] DOB: [birth year]   Cancelled Treatment:    Reason Eval/Treat Not Completed: Patient declined, no reason specified.  Pt refused PT for third time.  Per PT guidelines, will discontinue order.  Please re-consult PT when medically appropriate and pt willing to work with PT.  Lyndel SafeGarrett Takita Riecke, SPT  Lyndel SafeGarrett Kalasia Crafton 01/16/2016, 9:36 AM

## 2016-01-16 NOTE — Care Management Important Message (Signed)
Important Message  Patient Details  Name: Catherine Holmes MRN: [id number] Date of Birth: [birth year]   Medicare Important Message Given:  Yes    Eber HongGreene, Lamona Eimer R, RN 01/16/2016, 9:07 AM

## 2016-01-17 ENCOUNTER — Ambulatory Visit: Payer: Medicare Other

## 2016-01-17 LAB — BASIC METABOLIC PANEL
ANION GAP: 6 (ref 5–15)
BUN: 26 mg/dL — AB (ref 6–20)
CALCIUM: 8.2 mg/dL — AB (ref 8.9–10.3)
CO2: 44 mmol/L — AB (ref 22–32)
Chloride: 81 mmol/L — ABNORMAL LOW (ref 101–111)
Creatinine, Ser: 0.53 mg/dL (ref 0.44–1.00)
GFR calc Af Amer: >60 mL/min (ref >60)
GFR calc non Af Amer: >60 mL/min (ref >60)
GLUCOSE: 174 mg/dL — AB (ref 65–99)
Potassium: 3.6 mmol/L (ref 3.5–5.1)
SODIUM: 131 mmol/L — AB (ref 135–145)

## 2016-01-17 LAB — MAGNESIUM: Magnesium: 2.2 mg/dL (ref 1.7–2.4)

## 2016-01-17 MED ORDER — POTASSIUM CHLORIDE CRYS ER 20 MEQ PO TBCR
40.0000 meq | EXTENDED_RELEASE_TABLET | Freq: Once | ORAL | Status: AC
  Administered 2016-01-17: 40 meq via ORAL
  Filled 2016-01-17: qty 2

## 2016-01-17 NOTE — Care Management (Signed)
Barrier to discharge- IV Bumex.  Pulmonary hypertension remains worse than her patient's daughter is to tour [facility] to see if would like to consider transfer to [facility]ac

## 2016-01-17 NOTE — Progress Notes (Signed)
PATIENT NAME: Catherine Holmes    MR#:  [id number]  DATE OF BIRTH:  Jun 26, 1928  SUBJECTIVE:   Still with shortness of breath, better than yesterday, not at baseline Less hemoptysis today Good diuresis yesterday, 3 L negative, normal renal function Talked with family at the bedside    REVIEW OF SYSTEMS:    Review of Systems  Constitutional: Negative for fever, chills and malaise/fatigue.  HENT: Negative for ear discharge, ear pain, hearing loss, nosebleeds and sore throat.   Eyes: Negative for blurred vision and pain.  Respiratory: Positive for cough, hemoptysis and shortness of breath (better). Negative for wheezing.   Cardiovascular: Negative for chest pain, palpitations and leg swelling.  Gastrointestinal: Negative for nausea, vomiting, abdominal pain, diarrhea and blood in stool.  Genitourinary: Negative for dysuria.  Musculoskeletal: Negative for back pain.  Neurological: Negative for dizziness, tremors, speech change, focal weakness, seizures and headaches.  Endo/Heme/Allergies: Does not bruise/bleed easily.  Psychiatric/Behavioral: Negative for depression, suicidal ideas and hallucinations.     DRUG ALLERGIES:   Allergies  Allergen Reactions  . Singlet [Chlorphen-Pe-Acetaminophen] Shortness Of Breath  . Atorvastatin Other (See Comments)    Reaction:  Unknown   . Clindamycin Diarrhea and Nausea And Vomiting  . Codeine Other (See Comments)    Reaction:  Unknown   . Doxycycline Hives and Itching  . Epinephrine Hives and Itching  . Erythromycin Hives and Itching  . Fluticasone-Salmeterol Other (See Comments)    Reaction:  Unknown   . Lasix [Furosemide] Itching  . Levofloxacin Other (See Comments)    Reaction:  Unknown   . Penicillins Other (See Comments)    Reaction:  Unknown   . Propoxyphene Hcl Other (See Comments)    Reaction:  Unknown   . Rosuvastatin Other (See Comments)    Reaction:  Unknown   . Simvastatin Other (See Comments)    Reaction:  Unknown   .  Sulfamethoxazole-Trimethoprim Other (See Comments)    Reaction:  Unknown   . Teriparatide Other (See Comments)    Reaction:  Unknown   . Tetracyclines & Related Hives and Itching  . Ceclor [Cefaclor] Hives and Other (See Comments)    Reaction:  Altered mental status    . Glipizide Hives and Itching  . Lisinopril Hives and Itching    VITALS:  Blood pressure 153/52, pulse 70, temperature 97.6 F (36.4 C), temperature source Oral, resp. rate 16, height 5' (1.524 m), weight 160 lb 14.4 oz (72.984 kg), SpO2 95 %.  PHYSICAL EXAMINATION:   Physical Exam  Constitutional: She is well-developed, well-nourished, and in no distress. No distress.  HENT:  Head: Normocephalic.  Eyes: No scleral icterus.  Neck: Normal range of motion. Neck supple. No JVD present. No tracheal deviation present.  Cardiovascular: Normal rate and regular rhythm.  Exam reveals no gallop and no friction rub.   Murmur heard. Pulmonary/Chest: Effort normal. No respiratory distress. She has decreased breath sounds. She has no wheezes. She has no rales. She exhibits no tenderness.  Abdominal: Soft. Bowel sounds are normal. She exhibits no distension and no mass. There is no tenderness. There is no rebound and no guarding.  Musculoskeletal: Normal range of motion. She exhibits no edema.  Neurological: She is alert.  Skin: Skin is warm. No rash noted. No erythema.  Psychiatric: Judgment normal.      LABORATORY PANEL:   CBC  Recent Labs Lab 01/16/16 0711  WBC 6.4  HGB 8.4*  HCT 26.4*  PLT 240   ------------------------------------------------------------------------------------------------------------------  Chemistries  Recent Labs Lab 01/10/16 1526  01/17/16 0528  NA 133*  < > 131*  K 3.7  < > 3.6  CL 95*  < > 81*  CO2 32  < > 44*  GLUCOSE 150*  < > 174*  BUN 21*  < > 26*  CREATININE 0.67  < > 0.53  CALCIUM 8.2*  < > 8.2*  MG  --   < > 2.2  AST 19  --   --   ALT 21  --   --   ALKPHOS 67  --    --   BILITOT 0.4  --   --   < > = values in this interval not displayed. ------------------------------------------------------------------------------------------------------------------  Cardiac Enzymes  Recent Labs Lab 01/12/16 0442 01/12/16 1106 01/12/16 1551  TROPONINI <0.03 <0.03 <0.03   ------------------------------------------------------------------------------------------------------------------  RADIOLOGY:  Dg Chest 2 View  01/15/2016  CLINICAL DATA:  Acute hypoxic respiratory failure. COPD exacerbation. Heart failure. EXAM: CHEST  2 VIEW COMPARISON:  01/13/2016.  Also 01/10/2016 and 12/27/2015 FINDINGS: Cardiomegaly. Lower lobe airspace opacity with BILATERAL effusions as well as generalized vascular congestion favored to represent early or resolving pulmonary edema. Hiatal hernia. Mid thoracic compression fracture, unchanged from 12/27/2015. IMPRESSION: Cardiomegaly. Early or resolving pulmonary edema. Worsening aeration compared with priors. Electronically Signed   By: Elsie Stain M.D.   On: 01/15/2016 14:51     ASSESSMENT AND PLAN:   82 year old female who presents with acute hypoxic respiratory failure due to COPD exacerbation and diastolic heart failure.  --Acute on chronic diastolic CHF  Severe pulmonary hypertension on echocardiogram, higher than her baseline , pressures were >90 mmHg much of her breathing is secondary to fluid overload  acute on chronic diastolic CHF Exacerbated by anemia -Would continue Bumex IV every 8hrs, -Likely will need several days of aggressive diuresis given severe pulmonary hypertension on echocardiogram Elevated right heart pressures likely also responsible for hemoptysis    --Pulmonary hypertension  In conjunction with acute on chronic diastolic CHF  Needs aggressive diuresis, close monitoring of renal function daily  This is not a new issue, worsening of a chronic issue  --- COPD  Appears relatively stable  Just completed  prednisone, antibiotics as outpatient   --- Arrhythmia  Seen by Dr. Graciela Husbands over the weekend, recommendation has been made to stop amiodarone   -- Acute hypoxic respiratory failure:   syncope and near-syncope episodes is likely secondary to hypoxia  Episodes of hypoxia secondary to severe high right-sided pressures /pulmoary HTN Continue diur etics as above  --Essential hypertension:  Continue hydralazine, Bystolic, Aldactone, Cardura,  --Depression:  Seen by psychiatry   --PAF:  Continue aspirin. No anticoagulation due to history of falls  Bystolic for heart rate control.  --Acute blood loss anemia:  status post 1 unit PRBC.  Blood count remains low, may need additional unit of blood to reach hematocrit 30  -- Deconditioning with physical therapy. Will need SNF Full code   Long discussion with daughter concerning the plan  Total encounter time more than 35 minutes  Greater than 50% was spent in counseling and coordination of care with the patient  Signed, Dossie Arbour, MD, Ph.D [facility]

## 2016-01-17 NOTE — Progress Notes (Signed)
[facility] - [place] at [facility]   PATIENT NAME: Catherine Holmes    MR#:  [id number]  DATE OF BIRTH:  [birth year]  SUBJECTIVE:    Pt is  not feeling well. Less Short of breath today , intermittent episodes of hemoptysis  REVIEW OF SYSTEMS:    Review of Systems  Constitutional: Negative for fever, chills and malaise/fatigue.  HENT: Negative for ear discharge, ear pain, hearing loss, nosebleeds and sore throat.   Eyes: Negative for blurred vision and pain.  Respiratory: Positive for cough, hemoptysis and shortness of breath (better). Negative for wheezing.   Cardiovascular: Negative for chest pain, palpitations and leg swelling.  Gastrointestinal: Negative for nausea, vomiting, abdominal pain, diarrhea and blood in stool.  Genitourinary: Negative for dysuria.  Musculoskeletal: Negative for back pain.  Neurological: Negative for dizziness, tremors, speech change, focal weakness, seizures and headaches.  Endo/Heme/Allergies: Does not bruise/bleed easily.  Psychiatric/Behavioral: Negative for depression, suicidal ideas and hallucinations.    Tolerating Diet:yes heart healthy      DRUG ALLERGIES:   Allergies  Allergen Reactions  . Singlet [Chlorphen-Pe-Acetaminophen] Shortness Of Breath  . Atorvastatin Other (See Comments)    Reaction:  Unknown   . Clindamycin Diarrhea and Nausea And Vomiting  . Codeine Other (See Comments)    Reaction:  Unknown   . Doxycycline Hives and Itching  . Epinephrine Hives and Itching  . Erythromycin Hives and Itching  . Fluticasone-Salmeterol Other (See Comments)    Reaction:  Unknown   . Lasix [Furosemide] Itching  . Levofloxacin Other (See Comments)    Reaction:  Unknown   . Penicillins Other (See Comments)    Reaction:  Unknown   . Propoxyphene Hcl Other (See Comments)    Reaction:  Unknown   . Rosuvastatin Other (See Comments)    Reaction:  Unknown   . Simvastatin Other (See Comments)    Reaction:  Unknown    . Sulfamethoxazole-Trimethoprim Other (See Comments)    Reaction:  Unknown   . Teriparatide Other (See Comments)    Reaction:  Unknown   . Tetracyclines & Related Hives and Itching  . Ceclor [Cefaclor] Hives and Other (See Comments)    Reaction:  Altered mental status    . Glipizide Hives and Itching  . Lisinopril Hives and Itching    VITALS:  Blood pressure 127/49, pulse 74, temperature 97.5 F (36.4 C), temperature source Oral, resp. rate 18, height 5' (1.524 m), weight 72.984 kg (160 lb 14.4 oz), SpO2 99 %.  PHYSICAL EXAMINATION:   Physical Exam  Constitutional: She is well-developed, well-nourished, and in no distress. No distress.  HENT:  Head: Normocephalic.  BIPAP  Eyes: No scleral icterus.  Neck: Normal range of motion. Neck supple. No JVD present. No tracheal deviation present.  Cardiovascular: Normal rate and regular rhythm.  Exam reveals no gallop and no friction rub.   Murmur heard. Pulmonary/Chest: Breath sounds normal. No respiratory distress. She has no wheezes. She has no rales. She exhibits no tenderness.  Abdominal: Soft. Bowel sounds are normal. She exhibits no distension and no mass. There is no tenderness. There is no rebound and no guarding.  Musculoskeletal: Normal range of motion. She exhibits no edema.  Neurological: She is alert.  Skin: Skin is warm. No rash noted. No erythema.  Psychiatric: Judgment normal.      LABORATORY PANEL:   CBC  Recent Labs Lab 01/16/16 0711  WBC 6.4  HGB 8.4*  HCT 26.4*  PLT 240   ------------------------------------------------------------------------------------------------------------------  Chemistries   Recent Labs Lab 01/10/16 1526  01/17/16 0528  NA 133*  < > 131*  K 3.7  < > 3.6  CL 95*  < > 81*  CO2 32  < > 44*  GLUCOSE 150*  < > 174*  BUN 21*  < > 26*  CREATININE 0.67  < > 0.53  CALCIUM 8.2*  < > 8.2*  MG  --   < > 2.2  AST 19  --   --   ALT 21  --   --   ALKPHOS 67  --   --   BILITOT  0.4  --   --   < > = values in this interval not displayed. ------------------------------------------------------------------------------------------------------------------  Cardiac Enzymes  Recent Labs Lab 01/12/16 0442 01/12/16 1106 01/12/16 1551  TROPONINI <0.03 <0.03 <0.03   ------------------------------------------------------------------------------------------------------------------  RADIOLOGY:  No results found.   ASSESSMENT AND PLAN:   [id number] year old female who presents with acute hypoxic respiratory failure due to COPD exacerbation and diastolic heart failure.  #Acute on chronic diastolic CHF with severe pulmonary hypertension on echo/with fluid overload Which could be the etiology for her shortness of breathAnd hemoptysis Off note patient is noncompliant with her diurettic regimen and diet Patient is started on Bumex IV and increase to 3 times a day with strict monitoring of intake and output with Foley catheter Pulmonary hypertension might be the etiology of her hemoptysis too Monitor renal function closely. Appreciate cardiology recommendations  patient needs several days of aggressive diuresis-considering LTAC    #  cardiac arrhythmia Continue monitoring on telemetry repleted magnesium . Magnesium at 1.5 today. Repleted magnesium sulfate and check magnesium Amiodarone 150 iv once given once.cardiology is not recommending any more amiodarone  Decreased Celexa and xanax doses as they could cause QT prolongation.  Appreciate cardio and psych recommendations   # Acute hypoxic respiratory failure:  BiPAP is being weaned off. Tapered IV steroids to by mouth prednisone Continue nebulizer treatment.   3. Acute COPD exacerbation with improvement: Continue plan as outlined above.  4. Essential hypertension: Continue hydralazine, Bystolic, Aldactone, Cardura,  5. Depression: Continue Celexa appreciate psychiatry consult   6. PAF: Continue aspirin. No  anticoagulation due to history of falls   continue Bystolic for heart rate control.   8. Hyponatremia: improving Recheck in am  9. Acute blood loss anemia: Patient is status post 1 unit PRBC. Hemoglobin at admission was 7.2. Hemoglobin remained stable. Suspect anemia may have driven diastolic heart failure.  10. Deconditioning with physical therapy.-Pending Full code  TOTALTIME TAKING CARE OF THIS PATIENT: 35 minutes.    PT consult for disposition.  POSSIBLE 1-2, DEPENDING ON CLINICAL CONDITION.-LTAC Family's considering LTAC will follow-up with social worker,   Ramonita LabGouru, Hanan Moen M.D on 01/17/2016 at 2:57 PM  Between 7am to 6pm - Pager - [phone] After 6pm go to [email or web address] - password [email or web address] [facility]  [facility]  Office  [phone]  CC: Primary care physician; Marisue IvanLINTHAVONG, KANHKA, MD  Note: This dictation was prepared with Dragon dictation along with smaller phrase technology. Any transcriptional errors that result from this process are unintentional.

## 2016-01-18 DIAGNOSIS — R0600 Dyspnea, unspecified: Secondary | ICD-10-CM

## 2016-01-18 LAB — BASIC METABOLIC PANEL
Anion gap: 8 (ref 5–15)
BUN: 24 mg/dL — AB (ref 6–20)
CHLORIDE: 83 mmol/L — AB (ref 101–111)
CO2: 42 mmol/L — AB (ref 22–32)
Calcium: 8.3 mg/dL — ABNORMAL LOW (ref 8.9–10.3)
Creatinine, Ser: 0.56 mg/dL (ref 0.44–1.00)
GFR calc Af Amer: >60 mL/min (ref >60)
GFR calc non Af Amer: >60 mL/min (ref >60)
GLUCOSE: 187 mg/dL — AB (ref 65–99)
POTASSIUM: 4.1 mmol/L (ref 3.5–5.1)
SODIUM: 133 mmol/L — AB (ref 135–145)

## 2016-01-18 MED ORDER — FERROUS SULFATE 325 (65 FE) MG PO TABS
325.0000 mg | ORAL_TABLET | Freq: Two times a day (BID) | ORAL | Status: DC
  Administered 2016-01-18 – 2016-01-19 (×3): 325 mg via ORAL
  Filled 2016-01-18 (×3): qty 1

## 2016-01-18 NOTE — Progress Notes (Signed)
[facility] [place] [facility]   PATIENT NAME: Catherine Holmes    MR#:  [id number]  DATE OF BIRTH:  1928-10-17  SUBJECTIVE:    Pt is sleeping but arousable, daughter at bed side. Less Short of breath today , intermittent episodes of hemoptysis are getting better.   REVIEW OF SYSTEMS:    Review of Systems  Constitutional: Negative for fever, chills and malaise/fatigue.  HENT: Negative for ear discharge, ear pain, hearing loss, nosebleeds and sore throat.   Eyes: Negative for blurred vision and pain.  Respiratory: Positive for cough, hemoptysis and shortness of breath (better). Negative for wheezing.   Cardiovascular: Negative for chest pain, palpitations and leg swelling.  Gastrointestinal: Negative for nausea, vomiting, abdominal pain, diarrhea and blood in stool.  Genitourinary: Negative for dysuria.  Musculoskeletal: Negative for back pain.  Neurological: Negative for dizziness, tremors, speech change, focal weakness, seizures and headaches.  Endo/Heme/Allergies: Does not bruise/bleed easily.  Psychiatric/Behavioral: Negative for depression, suicidal ideas and hallucinations.    Tolerating Diet:yes heart healthy      DRUG ALLERGIES:   Allergies  Allergen Reactions  . Singlet [Chlorphen-Pe-Acetaminophen] Shortness Of Breath  . Atorvastatin Other (See Comments)    Reaction:  Unknown   . Clindamycin Diarrhea and Nausea And Vomiting  . Codeine Other (See Comments)    Reaction:  Unknown   . Doxycycline Hives and Itching  . Epinephrine Hives and Itching  . Erythromycin Hives and Itching  . Fluticasone-Salmeterol Other (See Comments)    Reaction:  Unknown   . Lasix [Furosemide] Itching  . Levofloxacin Other (See Comments)    Reaction:  Unknown   . Penicillins Other (See Comments)    Reaction:  Unknown   . Propoxyphene Hcl Other (See Comments)    Reaction:  Unknown   . Rosuvastatin Other (See Comments)    Reaction:  Unknown   . Simvastatin  Other (See Comments)    Reaction:  Unknown   . Sulfamethoxazole-Trimethoprim Other (See Comments)    Reaction:  Unknown   . Teriparatide Other (See Comments)    Reaction:  Unknown   . Tetracyclines & Related Hives and Itching  . Ceclor [Cefaclor] Hives and Other (See Comments)    Reaction:  Altered mental status    . Glipizide Hives and Itching  . Lisinopril Hives and Itching    VITALS:  Blood pressure 114/61, pulse 77, temperature 97.3 F (36.3 C), temperature source Oral, resp. rate 18, height 5' (1.524 m), weight 72.984 kg (160 lb 14.4 oz), SpO2 98 %.  PHYSICAL EXAMINATION:   Physical Exam  Constitutional: She is well-developed, well-nourished, and in no distress. No distress.  HENT:  Head: Normocephalic.  BIPAP  Eyes: No scleral icterus.  Neck: Normal range of motion. Neck supple. No JVD present. No tracheal deviation present.  Cardiovascular: Normal rate and regular rhythm.  Exam reveals no gallop and no friction rub.   Murmur heard. Pulmonary/Chest: Breath sounds normal. No respiratory distress. She has no wheezes. She has no rales. She exhibits no tenderness.  Abdominal: Soft. Bowel sounds are normal. She exhibits no distension and no mass. There is no tenderness. There is no rebound and no guarding.  Musculoskeletal: Normal range of motion. She exhibits no edema.  Neurological: She is alert.  Skin: Skin is warm. No rash noted. No erythema.  Psychiatric: Judgment normal.      LABORATORY PANEL:   CBC  Recent Labs Lab 01/16/16 0711  WBC 6.4  HGB 8.4*  HCT  26.4*  PLT 240   ------------------------------------------------------------------------------------------------------------------  Chemistries   Recent Labs Lab 01/17/16 0528 01/18/16 0519  NA 131* 133*  K 3.6 4.1  CL 81* 83*  CO2 44* 42*  GLUCOSE 174* 187*  BUN 26* 24*  CREATININE 0.53 0.56  CALCIUM 8.2* 8.3*  MG 2.2  --     ------------------------------------------------------------------------------------------------------------------  Cardiac Enzymes  Recent Labs Lab 01/12/16 0442 01/12/16 1106 01/12/16 1551  TROPONINI <0.03 <0.03 <0.03   ------------------------------------------------------------------------------------------------------------------  RADIOLOGY:  No results found.   ASSESSMENT AND PLAN:   82 year old female who presents with acute hypoxic respiratory failure due to COPD exacerbation and diastolic heart failure.  #Acute on chronic diastolic CHF with severe pulmonary hypertension on echo  Which could be the etiology for her shortness of breathAnd hemoptysis Off note patient is noncompliant with her diurettic regimen and diet Patient is on Bumex IV 3 times a day with strict monitoring of intake and output with Foley catheter Pulmonary hypertension might be the etiology of her hemoptysis too Monitor renal function closely. Appreciate cardiology recommendations  patient needs several days of aggressive diuresis-considering LTAC    #  cardiac arrhythmia Continue monitoring on telemetry repleted magnesium . Magnesium at 2.2 today.  Amiodarone 150 iv once given once.cardiology is not recommending any more doses  Decreased Celexa and xanax doses as they could cause QT prolongation.  Appreciate cardio and psych recommendations   # Acute hypoxic respiratory failure:  BiPAP is being weaned off. Tapered IV steroids to by mouth prednisone Continue nebulizer treatment.   3. Acute COPD exacerbation with improvement: Continue plan as outlined above.  4. Essential hypertension: Continue hydralazine, Bystolic, Aldactone, Cardura,  5. Depression: Continue Celexa appreciate psychiatry consult   6. PAF: Continue aspirin. No anticoagulation due to history of falls   continue Bystolic for heart rate control.   8. Hyponatremia: improving sodium -133 Recheck in am  9. Acute blood  loss anemia: Patient is status post 1 unit PRBC. Hemoglobin at admission was 7.2. Hemoglobin remained stable.   10. Deconditioning with physical therapy.-Pending, pt is refusing OOB to chair /PT eval as she is feeling weak Full code  TOTALTIME TAKING CARE OF THIS PATIENT: 35 minutes.    PT consult for disposition.  POSSIBLE 1-2, DEPENDING ON CLINICAL CONDITION.-LTAC Family's considering LTAC will follow-up with social worker,   Ramonita Lab M.D on 01/18/2016 at 8:19 PM  Between 7am to 6pm - Pager - [phone] After 6pm go to [email or web address] - password [email or web address] [facility]  [facility]  Office  [phone]  CC: Primary care physician; Marisue Ivan, MD  Note: This dictation was prepared with Dragon dictation along with smaller phrase technology. Any transcriptional errors that result from this process are unintentional.

## 2016-01-18 NOTE — Progress Notes (Signed)
Nurse received a message stating that patient had a run of VTACH (5 beats). Nurse assessed patient. Patient is resting calmly with bipap placed on face. Dr. Anne HahnWillis aware, will continue to monitor.

## 2016-01-18 NOTE — Progress Notes (Signed)
Patient has requested to come off bipap.  Wore appx 5 hours with no distress. o2 4 liters resumed

## 2016-01-18 NOTE — Progress Notes (Signed)
PATIENT NAME: Catherine Holmes    MR#:  [id number]  DATE OF BIRTH:  [birth year]  SUBJECTIVE:   Sleeping today, Nurses report continued shortness of breath, Good urine output, so far more than 5 L negative Making good urine with Bumex IV every 8 hours Has not been out of bed very much   REVIEW OF SYSTEMS:    Review of Systems  Constitutional: Negative for fever, chills and malaise/fatigue.  HENT: Negative for ear discharge, ear pain, hearing loss, nosebleeds and sore throat.   Eyes: Negative for blurred vision and pain.  Respiratory: Positive for cough, hemoptysis and shortness of breath (better). Negative for wheezing.   Cardiovascular: Negative for chest pain, palpitations and leg swelling.  Gastrointestinal: Negative for nausea, vomiting, abdominal pain, diarrhea and blood in stool.  Genitourinary: Negative for dysuria.  Musculoskeletal: Negative for back pain.  Neurological: Negative for dizziness, tremors, speech change, focal weakness, seizures and headaches.  Endo/Heme/Allergies: Does not bruise/bleed easily.  Psychiatric/Behavioral: Negative for depression, suicidal ideas and hallucinations.     DRUG ALLERGIES:   Allergies  Allergen Reactions  . Singlet [Chlorphen-Pe-Acetaminophen] Shortness Of Breath  . Atorvastatin Other (See Comments)    Reaction:  Unknown   . Clindamycin Diarrhea and Nausea And Vomiting  . Codeine Other (See Comments)    Reaction:  Unknown   . Doxycycline Hives and Itching  . Epinephrine Hives and Itching  . Erythromycin Hives and Itching  . Fluticasone-Salmeterol Other (See Comments)    Reaction:  Unknown   . Lasix [Furosemide] Itching  . Levofloxacin Other (See Comments)    Reaction:  Unknown   . Penicillins Other (See Comments)    Reaction:  Unknown   . Propoxyphene Hcl Other (See Comments)    Reaction:  Unknown   . Rosuvastatin Other (See Comments)    Reaction:  Unknown   . Simvastatin Other (See Comments)    Reaction:  Unknown   .  Sulfamethoxazole-Trimethoprim Other (See Comments)    Reaction:  Unknown   . Teriparatide Other (See Comments)    Reaction:  Unknown   . Tetracyclines & Related Hives and Itching  . Ceclor [Cefaclor] Hives and Other (See Comments)    Reaction:  Altered mental status    . Glipizide Hives and Itching  . Lisinopril Hives and Itching    VITALS:  Blood pressure 123/45, pulse 67, temperature 97.5 F (36.4 C), temperature source Oral, resp. rate 18, height 5' (1.524 m), weight 160 lb 14.4 oz (72.984 kg), SpO2 100 %.  PHYSICAL EXAMINATION:   Physical Exam  Constitutional: She is well-developed, well-nourished, and in no distress. No distress.  HENT:  Head: Normocephalic.  Eyes: No scleral icterus.  Neck: Normal range of motion. Neck supple. No JVD present. No tracheal deviation present.  Cardiovascular: Normal rate and regular rhythm.  Exam reveals no gallop and no friction rub.   Murmur heard. Pulmonary/Chest: Effort normal. No respiratory distress. She has decreased breath sounds. She has no wheezes. She has no rales. She exhibits no tenderness.  Abdominal: Soft. Bowel sounds are normal. She exhibits no distension and no mass. There is no tenderness. There is no rebound and no guarding.  Musculoskeletal: Normal range of motion. She exhibits no edema.  Neurological: She is alert.  Skin: Skin is warm. No rash noted. No erythema.  Psychiatric: Judgment normal.      LABORATORY PANEL:   CBC  Recent Labs Lab 01/16/16 0711  WBC 6.4  HGB 8.4*  HCT 26.4*  PLT 240   ------------------------------------------------------------------------------------------------------------------  Chemistries   Recent Labs Lab 01/17/16 0528 01/18/16 0519  NA 131* 133*  K 3.6 4.1  CL 81* 83*  CO2 44* 42*  GLUCOSE 174* 187*  BUN 26* 24*  CREATININE 0.53 0.56  CALCIUM 8.2* 8.3*  MG 2.2  --     ------------------------------------------------------------------------------------------------------------------  Cardiac Enzymes  Recent Labs Lab 01/12/16 0442 01/12/16 1106 01/12/16 1551  TROPONINI <0.03 <0.03 <0.03   ------------------------------------------------------------------------------------------------------------------  RADIOLOGY:  No results found.   ASSESSMENT AND PLAN:   82 year old female who presents with acute hypoxic respiratory failure due to COPD exacerbation and diastolic heart failure.  --Acute on chronic diastolic CHF  Severe pulmonary hypertension on echocardiogram,  pressures were >90 mmHg (normal <30) much of her breathing is secondary to fluid overload , acute on chronic diastolic CHF Exacerbated by anemia Elevated right heart pressures likely also responsible for hemoptysis  -Would continue Bumex IV every 8hrs, No significant change in renal function to suggest dehydration, given her continued shortness of breath, would continue IV diuresis otherwise she is high risk of recurrent admission  --Pulmonary hypertension  In conjunction with acute on chronic diastolic CHF  Needs continued aggressive diuresis with IV diuretic  --- COPD  Appears relatively stable  Just completed prednisone, antibiotics as outpatient   --- Arrhythmia  Seen by Dr. Graciela HusbandsKlein over the weekend, recommendation has been made to stop amiodarone   -- Acute hypoxic respiratory failure:   syncope and near-syncope episodes is likely secondary to hypoxia  Episodes of hypoxia secondary to severe high right-sided pressures /pulmoary HTN Continue diuretics as above  --Essential hypertension:  Continue hydralazine, Bystolic, Aldactone, Cardura,  --Depression:  Seen by psychiatry   --PAF:  Continue aspirin. No anticoagulation due to history of falls  Bystolic for heart rate control.  --Acute blood loss anemia:  status post 1 unit PRBC.  Blood count remains low, may need  additional unit of blood to reach hematocrit 30  -- Deconditioned  Long discussion with daughter concerning the plan  Total encounter time more than 25 minutes  Greater than 50% was spent in counseling and coordination of care with the patient  Signed, Dossie Arbourim Gollan, MD, Ph.D [facility]

## 2016-01-18 NOTE — Care Management (Signed)
Have left message for daughter to discuss whether she has visited [facility].  Cardiology indicates that patient requires a few more days of acute level of care before considering transfer to ltac.  If patient's daughter declines ltac, or patient does not continues to meet criteria for ltac, may need to pursue palliative care consult for goals of care and symptom managment

## 2016-01-19 ENCOUNTER — Ambulatory Visit (HOSPITAL_COMMUNITY)
Admission: AD | Admit: 2016-01-19 | Discharge: 2016-01-19 | Disposition: A | Discharge status: Home / Self Care | Payer: Medicare Other | Source: Other Acute Inpatient Hospital | Attending: Pulmonary Disease | Admitting: Pulmonary Disease

## 2016-01-19 DIAGNOSIS — J9690 Respiratory failure, unspecified, unspecified whether with hypoxia or hypercapnia: Secondary | ICD-10-CM | POA: Insufficient documentation

## 2016-01-19 LAB — CBC
HEMATOCRIT: 28.1 % — AB (ref 35.0–47.0)
Hemoglobin: 9 g/dL — ABNORMAL LOW (ref 12.0–16.0)
MCH: 26 pg (ref 26.0–34.0)
MCHC: 32.1 g/dL (ref 32.0–36.0)
MCV: 81.2 fL (ref 80.0–100.0)
Platelets: 222 10*3/uL (ref 150–440)
RBC: 3.46 MIL/uL — ABNORMAL LOW (ref 3.80–5.20)
RDW: 20.9 % — AB (ref 11.5–14.5)
WBC: 8.9 10*3/uL (ref 3.6–11.0)

## 2016-01-19 LAB — BASIC METABOLIC PANEL
Anion gap: 6 (ref 5–15)
BUN: 26 mg/dL — AB (ref 6–20)
CHLORIDE: 81 mmol/L — AB (ref 101–111)
CO2: 42 mmol/L — AB (ref 22–32)
CREATININE: 0.56 mg/dL (ref 0.44–1.00)
Calcium: 8 mg/dL — ABNORMAL LOW (ref 8.9–10.3)
GFR calc Af Amer: >60 mL/min (ref >60)
GFR calc non Af Amer: >60 mL/min (ref >60)
Glucose, Bld: 214 mg/dL — ABNORMAL HIGH (ref 65–99)
POTASSIUM: 3.6 mmol/L (ref 3.5–5.1)
SODIUM: 129 mmol/L — AB (ref 135–145)

## 2016-01-19 MED ORDER — FERROUS SULFATE 325 (65 FE) MG PO TABS
325.0000 mg | ORAL_TABLET | Freq: Two times a day (BID) | ORAL | Status: AC
Start: 2016-01-19 — End: ?

## 2016-01-19 MED ORDER — BUMETANIDE 1 MG PO TABS: 1.0000 mg | ORAL_TABLET | Freq: Two times a day (BID) | ORAL | Status: AC

## 2016-01-19 MED ORDER — PREDNISONE 50 MG PO TABS: ORAL_TABLET | ORAL | Status: DC

## 2016-01-19 MED ORDER — BUMETANIDE 1 MG PO TABS
1.0000 mg | ORAL_TABLET | Freq: Two times a day (BID) | ORAL | Status: DC
  Administered 2016-01-19: 1 mg via ORAL
  Filled 2016-01-19: qty 1

## 2016-01-19 MED ORDER — TIOTROPIUM BROMIDE MONOHYDRATE 18 MCG IN CAPS: 18.0000 ug | ORAL_CAPSULE | Freq: Every day | RESPIRATORY_TRACT | Status: AC

## 2016-01-19 MED ORDER — POLYVINYL ALCOHOL 1.4 % OP SOLN
1.0000 [drp] | Freq: Two times a day (BID) | OPHTHALMIC | Status: AC
Start: 2016-01-19 — End: ?

## 2016-01-19 MED ORDER — BUDESONIDE 0.25 MG/2ML IN SUSP: 0.2500 mg | Freq: Two times a day (BID) | RESPIRATORY_TRACT | Status: AC

## 2016-01-19 MED ORDER — ENSURE ENLIVE PO LIQD: 237.0000 mL | Freq: Two times a day (BID) | ORAL | Status: AC

## 2016-01-19 MED ORDER — HYDRALAZINE HCL 25 MG PO TABS
25.0000 mg | ORAL_TABLET | Freq: Three times a day (TID) | ORAL | Status: AC
Start: 2016-01-19 — End: ?

## 2016-01-19 NOTE — Progress Notes (Signed)
[facility] report called to receiving nurse, patient waiting for transport

## 2016-01-19 NOTE — Discharge Summary (Signed)
Catherine Holmes, 82 y.o., DOB May 30, 1928, MRN [id number]. Admission date: 01/10/2016 Discharge Date 01/19/2016 Primary MD Marisue IvanLINTHAVONG, KANHKA, MD Admitting Physician Alford Highlandichard Wieting, MD  Admission Diagnosis  Dyspnea [R06.00] Symptomatic anemia [D64.9] Gastrointestinal hemorrhage, unspecified gastritis, unspecified gastrointestinal hemorrhage type [K92.2]  Discharge Diagnosis   Active Problems:  Acute respiratory failure (HCC) Severe pulmonary hypertension acute on chronic diastolic CHF Paroxysmal atrial fibrillation Depression, major, single episode, mild (HCC) Hemoptysis Symptomatic anemia Dyspnea GERD Diabetes type 2           Hospital Course Catherine Holmes is a 82 y.o. female with a known history of COPD and CHF presents with shortness of breath. Patient had to be placed BiPAP on admission in the emergency room. She was diagnosed with acute on chronic diastolic CHF as well as acute COPD exasperation. Treated with diuresis as well as therapy for acute on chronic COPD exasperation. Patient was slow to improve. She also has severe pulmonary hypertension. During the hospitalization she did also have hemoptysis. Patient was followed by cardiology during the hospitalization. And her regimen was adjusted. Currently she continues to have dyspnea and it is slow to improve [facility].           Consults  cardiology  Significant Tests:  See full reports for all details    Dg Chest 1 View  01/13/2016  CLINICAL DATA:  Respiratory failure EXAM: CHEST 1 VIEW COMPARISON:  01/10/2016 FINDINGS: Vascular congestion and interstitial edema have improved. Hazy opacity at the right base likely a combination of pleural fluid and volume loss is stable. Stable hiatal hernia. Cardiomegaly. No pneumothorax. IMPRESSION: Improved vascular congestion and edema. Stable right basilar opacity. Electronically Signed   By: Jolaine ClickArthur  Hoss M.D.   On: 01/13/2016  08:48   Dg Chest 2 View  01/15/2016  CLINICAL DATA:  Acute hypoxic respiratory failure. COPD exacerbation. Heart failure. EXAM: CHEST  2 VIEW COMPARISON:  01/13/2016.  Also 01/10/2016 and 12/27/2015 FINDINGS: Cardiomegaly. Lower lobe airspace opacity with BILATERAL effusions as well as generalized vascular congestion favored to represent early or resolving pulmonary edema. Hiatal hernia. Mid thoracic compression fracture, unchanged from 12/27/2015. IMPRESSION: Cardiomegaly. Early or resolving pulmonary edema. Worsening aeration compared with priors. Electronically Signed   By: Elsie StainJohn T Curnes M.D.   On: 01/15/2016 14:51   Dg Chest 2 View  12/27/2015  CLINICAL DATA:  Nonproductive cough x1 week, wheezing EXAM: CHEST  2 VIEW COMPARISON:  12/05/2015 FINDINGS: Mild patchy right lower lobe opacity. No pleural effusion or pneumothorax. Heart is top-normal in size. Moderate to large hiatal hernia. IMPRESSION: Mild patchy right lower lobe opacity, suspicious for pneumonia. Electronically Signed   By: Charline BillsSriyesh  Krishnan M.D.   On: 12/27/2015 18:35   Dg Chest Portable 1 View  01/10/2016  CLINICAL DATA:  Unresponsive EXAM: PORTABLE CHEST 1 VIEW COMPARISON:  12/26/2005 FINDINGS: Stable enlarged cardiac silhouette. Large hiatal hernia again noted. Decrease in lung volumes compared to prior. Mild interstitial edema pattern noted. No focal infiltrate. No pneumothorax. No overt pulmonary edema. IMPRESSION: 1. Decrease in lung volumes compared to prior. 2. Mild interstitial edema pattern. 3. Large hiatal hernia and cardiomegaly. Electronically Signed   By: Genevive BiStewart  Edmunds M.D.   On: 01/10/2016 15:42       Today   Subjective:   Catherine Holmes  continues to complain of shortness of breath with some improvement. No hemoptysis  Objective:   Blood pressure 138/55, pulse 59, temperature 97.7 F (36.5 C), temperature source Oral, resp.  rate 15, height 5' (1.524 m), weight 72.984 kg (160 lb 14.4 oz), SpO2 98 %.   .  Intake/Output Summary (Last 24 hours) at 01/19/16 0921 Last data filed at 01/18/16 1853  Gross per 24 hour  Intake   2100 ml  Output      0 ml  Net   2100 ml    Exam VITAL SIGNS: Blood pressure 138/55, pulse 59, temperature 97.7 F (36.5 C), temperature source Oral, resp. rate 15, height 5' (1.524 m), weight 72.984 kg (160 lb 14.4 oz), SpO2 98 %.  GENERAL:  82 y.o.-year-old patient lying in the bed with no acute distress.  EYES: Pupils equal, round, reactive to light and accommodation. No scleral icterus. Extraocular muscles intact.  HEENT: Head atraumatic, normocephalic. Oropharynx and nasopharynx clear.  NECK:  Supple, no jugular venous distention. No thyroid enlargement, no tenderness.  LUNGS:Bilateral crackles, no sensory muscle usage no wheezing or rhonchi CARDIOVASCULAR: S1, S2 normal. No murmurs, rubs, or gallops.  ABDOMEN: Soft, nontender, nondistended. Bowel sounds present. No organomegaly or mass.  EXTREMITIES: No pedal edema, cyanosis, or clubbing.  NEUROLOGIC: Cranial nerves II through XII are intact. Muscle strength 5/5 in all extremities. Sensation intact. Gait not checked.  PSYCHIATRIC: The patient is alert and oriented x 3.  SKIN: No obvious rash, lesion, or ulcer.   Data Review     CBC w Diff: Lab Results  Component Value Date   WBC 8.9 01/19/2016   WBC 7.4 11/17/2014   HGB 9.0* 01/19/2016   HGB 11.0* 11/17/2014   HCT 28.1* 01/19/2016   HCT 33.9* 11/17/2014   PLT 222 01/19/2016   PLT 264 11/17/2014   LYMPHOPCT 13 12/05/2015   LYMPHOPCT 12.1 11/17/2014   MONOPCT 6 12/05/2015   MONOPCT 6.7 11/17/2014   MONOPCT 6 05/14/2014   EOSPCT 1 12/05/2015   EOSPCT 0.9 11/17/2014   BASOPCT 1 12/05/2015   BASOPCT 0.6 11/17/2014   CMP: Lab Results  Component Value Date   NA 129* 01/19/2016   NA 129* 12/01/2015   NA 131* 06/08/2014   K 3.6 01/19/2016   K 4.6 06/08/2014   CL 81* 01/19/2016   CL 91* 06/08/2014   CO2 42* 01/19/2016   CO2 34* 06/08/2014    BUN 26* 01/19/2016   BUN 11 12/01/2015   BUN 15 06/08/2014   CREATININE 0.56 01/19/2016   CREATININE 0.66 06/08/2014   PROT 5.4* 01/10/2016   PROT 6.2* 05/10/2014   ALBUMIN 3.0* 01/10/2016   ALBUMIN - 06/02/2014   BILITOT 0.4 01/10/2016   BILITOT 0.3 05/10/2014   ALKPHOS 67 01/10/2016   ALKPHOS 94 05/10/2014   AST 19 01/10/2016   AST 35 05/10/2014   ALT 21 01/10/2016   ALT 39 05/10/2014  .  Micro Results Recent Results (from the past 240 hour(s))  Blood culture (routine x 2)     Status: None (Preliminary result)   Collection Time: 01/10/16  3:26 PM  Result Value Ref Range Status   Specimen Description BLOOD RIGHT WRIST  Final   Special Requests BOTTLES DRAWN AEROBIC AND ANAEROBIC  Final   Culture NO GROWTH 4 DAYS  Final   Report Status PENDING  Incomplete  Blood culture (routine x 2)     Status: None (Preliminary result)   Collection Time: 01/10/16  3:26 PM  Result Value Ref Range Status   Specimen Description BLOOD LEFT HAND  Final   Special Requests BOTTLES DRAWN AEROBIC AND ANAEROBIC  Final   Culture NO GROWTH 4  DAYS  Final   Report Status PENDING  Incomplete  MRSA PCR Screening     Status: None   Collection Time: 01/10/16  9:48 PM  Result Value Ref Range Status   MRSA by PCR NEGATIVE NEGATIVE Final    Comment:        The GeneXpert MRSA Assay (FDA approved for NASAL specimens only), is one component of a comprehensive MRSA colonization surveillance program. It is not intended to diagnose MRSA infection nor to guide or monitor treatment for MRSA infections.         Code Status Orders        Start     Ordered   01/10/16 1818  Full code   Continuous     01/10/16 1818    Code Status History    Date Active Date Inactive Code Status Order ID Comments User Context   11/13/2015  8:15 PM 11/21/2015  9:04 PM Full Code [id number]  Katha Hamming, MD ED   07/28/2015 10:24 PM 07/29/2015  9:42 PM DNR [id number]  Wyatt Haste, MD ED   04/10/2015 11:23 PM  04/13/2015  5:49 PM Full Code [id number]  Wyatt Haste, MD ED   03/31/2015  2:26 PM 04/04/2015  4:15 PM Full Code [id number]  Houston Siren, MD Inpatient    Advance Directive Documentation        Most Recent Value   Type of Advance Directive  Living will, Healthcare Power of Attorney   Pre-existing out of facility DNR order (yellow form or pink MOST form)     "MOST" Form in Place?            Follow-up Information    Follow up with Julien Nordmann, MD In 1 month.   Specialty:  Cardiology   Contact information:   [address] [place] [address] [phone]       Discharge Medications     Medication List    TAKE these medications        albuterol (2.5 MG/3ML) 0.083% nebulizer solution  Commonly known as:  PROVENTIL  Take 2.5 mg by nebulization every 6 (six) hours as needed for wheezing or shortness of breath.     ALPRAZolam 0.25 MG tablet  Commonly known as:  XANAX  Take 1 tablet (0.25 mg total) by mouth 3 (three) times daily as needed for anxiety.     aspirin EC 81 MG tablet  Take 81 mg by mouth daily with lunch.     budesonide 0.25 MG/2ML nebulizer solution  Commonly known as:  PULMICORT  Take 2 mLs (0.25 mg total) by nebulization 2 (two) times daily.     bumetanide 1 MG tablet  Commonly known as:  BUMEX  Take 1 tablet (1 mg total) by mouth 2 (two) times daily.     calcium carbonate 1500 (600 Ca) MG Tabs tablet  Commonly known as:  OSCAL  Take 600 mg of elemental calcium by mouth daily with breakfast.     chlorhexidine 0.12 % solution  Commonly known as:  PERIDEX  15 mLs by Mouth Rinse route at bedtime.     citalopram 20 MG tablet  Commonly known as:  CELEXA  Take 20 mg by mouth daily with lunch.     diphenhydramine-acetaminophen 25-500 MG Tabs tablet  Commonly known as:  TYLENOL PM  Take 1 tablet by mouth at bedtime as needed (for sleep).     doxazosin 4 MG tablet  Commonly known as:  CARDURA  Take 4 mg by mouth at bedtime.      feeding supplement (ENSURE ENLIVE) Liqd  Take 237 mLs by mouth 2 (two) times daily between meals.     ferrous sulfate 325 (65 FE) MG tablet  Take 1 tablet (325 mg total) by mouth 2 (two) times daily with a meal.     fluticasone 50 MCG/ACT nasal spray  Commonly known as:  FLONASE  Place 1 spray into both nostrils 2 (two) times daily.     gabapentin 100 MG capsule  Commonly known as:  NEURONTIN  Take 100 mg by mouth 2 (two) times daily.     guaiFENesin-dextromethorphan 100-10 MG/5ML syrup  Commonly known as:  ROBITUSSIN DM  Take 5 mLs by mouth every 4 (four) hours as needed for cough.     hydrALAZINE 25 MG tablet  Commonly known as:  APRESOLINE  Take 1 tablet (25 mg total) by mouth every 8 (eight) hours.     HYDROcodone-acetaminophen 5-325 MG tablet  Commonly known as:  NORCO/VICODIN  Take 1 tablet by mouth every 4 (four) hours as needed for moderate pain.     mometasone-formoterol 100-5 MCG/ACT Aero  Commonly known as:  DULERA  Inhale 2 puffs into the lungs 2 (two) times daily.     mupirocin ointment 2 %  Commonly known as:  BACTROBAN  Apply 1 application topically daily as needed (for sores on leg).     nebivolol 5 MG tablet  Commonly known as:  BYSTOLIC  Take 1 tablet (5 mg total) by mouth daily.     nitroGLYCERIN 0.4 MG SL tablet  Commonly known as:  NITROSTAT  Place 0.4 mg under the tongue every 5 (five) minutes as needed for chest pain.     polyvinyl alcohol 1.4 % ophthalmic solution  Commonly known as:  LIQUIFILM TEARS  Place 1 drop into both eyes 2 (two) times daily.     predniSONE 50 MG tablet  Commonly known as:  DELTASONE  Taper     promethazine 25 MG tablet  Commonly known as:  PHENERGAN  Take 25 mg by mouth every 8 (eight) hours as needed for nausea or vomiting.     SPIRIVA RESPIMAT 1.25 MCG/ACT Aers  Generic drug:  Tiotropium Bromide Monohydrate  Inhale 1 puff into the lungs daily.     tiotropium 18 MCG inhalation capsule  Commonly known as:   SPIRIVA  Place 1 capsule (18 mcg total) into inhaler and inhale daily.     spironolactone 25 MG tablet  Commonly known as:  ALDACTONE  Take 25 mg by mouth every Monday, Wednesday, and Friday.     SYSTANE OP  Place 1 drop into both eyes 2 (two) times daily.     ZETIA 10 MG tablet  Generic drug:  ezetimibe  Take 10 mg by mouth at bedtime.           Total Time in preparing paper work, data evaluation and todays exam - 35 minutes  Auburn Bilberry M.D on 01/19/2016 at 9:21 AM  [facility]   Office  [phone]

## 2016-01-19 NOTE — Discharge Instructions (Signed)
°  DIET:  Cardiac diet  DISCHARGE CONDITION:  Serious  ACTIVITY:  Activity as tolerated  OXYGEN:  Home Oxygen: Yes.     Oxygen Delivery: 2 liters/min via Patient connected to nasal cannula oxygen  DISCHARGE LOCATION:  assited living    ADDITIONAL DISCHARGE INSTRUCTION:   If you experience worsening of your admission symptoms, develop shortness of breath, life threatening emergency, suicidal or homicidal thoughts you must seek medical attention immediately by calling 911 or calling your MD immediately  if symptoms less severe.  You Must read complete instructions/literature along with all the possible adverse reactions/side effects for all the Medicines you take and that have been prescribed to you. Take any new Medicines after you have completely understood and accpet all the possible adverse reactions/side effects.   Please note  You were cared for by a hospitalist during your hospital stay. If you have any questions about your discharge medications or the care you received while you were in the hospital after you are discharged, you can call the unit and asked to speak with the hospitalist on call if the hospitalist that took care of you is not available. Once you are discharged, your primary care physician will handle any further medical issues. Please note that NO REFILLS for any discharge medications will be authorized once you are discharged, as it is imperative that you return to your primary care physician (or establish a relationship with a primary care physician if you do not have one) for your aftercare needs so that they can reassess your need for medications and monitor your lab values.

## 2016-01-19 NOTE — Progress Notes (Signed)
Patient resting in the bed at this time, will continue to monitor, family at bedside. [facility] per md note and orde

## 2016-01-19 NOTE — Care Management (Signed)
Patient for transfer to [facility] today.  Patient and her daughter in agreement.  Sue Lushndrea from [facility] has faxed all medical record information.  [facility]  Nursing will print the emtala form and non emergent transfer form/ [facility] link packet.  case closed

## 2016-01-19 NOTE — Progress Notes (Signed)
PATIENT NAME: Catherine Holmes    MR#:  [id number]  DATE OF BIRTH:  11/11/1927  SUBJECTIVE:   Wide awake, eating, feels better, Mild shortness of breath, alert Has not been out of bed very much   REVIEW OF SYSTEMS:    Review of Systems  Constitutional: Negative for fever, chills and malaise/fatigue.  HENT: Negative for ear discharge, ear pain, hearing loss, nosebleeds and sore throat.   Eyes: Negative for blurred vision and pain.  Respiratory: Positive for shortness of breath (better). Negative for wheezing.   Cardiovascular: Negative for chest pain, palpitations and leg swelling.  Gastrointestinal: Negative for nausea, vomiting, abdominal pain, diarrhea and blood in stool.  Genitourinary: Negative for dysuria.  Musculoskeletal: Negative for back pain.  Neurological: Negative for dizziness, tremors, speech change, focal weakness, seizures and headaches.  Endo/Heme/Allergies: Does not bruise/bleed easily.  Psychiatric/Behavioral: Negative for depression, suicidal ideas and hallucinations.     DRUG ALLERGIES:   Allergies  Allergen Reactions  . Singlet [Chlorphen-Pe-Acetaminophen] Shortness Of Breath  . Atorvastatin Other (See Comments)    Reaction:  Unknown   . Clindamycin Diarrhea and Nausea And Vomiting  . Codeine Other (See Comments)    Reaction:  Unknown   . Doxycycline Hives and Itching  . Epinephrine Hives and Itching  . Erythromycin Hives and Itching  . Fluticasone-Salmeterol Other (See Comments)    Reaction:  Unknown   . Lasix [Furosemide] Itching  . Levofloxacin Other (See Comments)    Reaction:  Unknown   . Penicillins Other (See Comments)    Reaction:  Unknown   . Propoxyphene Hcl Other (See Comments)    Reaction:  Unknown   . Rosuvastatin Other (See Comments)    Reaction:  Unknown   . Simvastatin Other (See Comments)    Reaction:  Unknown   . Sulfamethoxazole-Trimethoprim Other (See Comments)    Reaction:  Unknown   . Teriparatide Other (See Comments)     Reaction:  Unknown   . Tetracyclines & Related Hives and Itching  . Ceclor [Cefaclor] Hives and Other (See Comments)    Reaction:  Altered mental status    . Glipizide Hives and Itching  . Lisinopril Hives and Itching    VITALS:  Blood pressure 138/55, pulse 59, temperature 97.7 F (36.5 C), temperature source Oral, resp. rate 15, height 5' (1.524 m), weight 160 lb 14.4 oz (72.984 kg), SpO2 98 %.  PHYSICAL EXAMINATION:   Physical Exam  Constitutional: She is well-developed, well-nourished, and in no distress. No distress.  HENT:  Head: Normocephalic.  Eyes: No scleral icterus.  Neck: Normal range of motion. Neck supple. No JVD present. No tracheal deviation present.  Cardiovascular: Normal rate and regular rhythm.  Exam reveals no gallop and no friction rub.   Murmur heard. Pulmonary/Chest: Effort normal. No respiratory distress. She has decreased breath sounds. She has no wheezes. She has no rales. She exhibits no tenderness.  Abdominal: Soft. Bowel sounds are normal. She exhibits no distension and no mass. There is no tenderness. There is no rebound and no guarding.  Musculoskeletal: Normal range of motion. She exhibits no edema.  Neurological: She is alert.  Skin: Skin is warm. No rash noted. No erythema.  Psychiatric: Judgment normal.      LABORATORY PANEL:   CBC  Recent Labs Lab 01/19/16 0401  WBC 8.9  HGB 9.0*  HCT 28.1*  PLT 222   ------------------------------------------------------------------------------------------------------------------  Chemistries   Recent Labs Lab 01/17/16 0528  01/19/16 0401  NA 131*  < >  129*  K 3.6  < > 3.6  CL 81*  < > 81*  CO2 44*  < > 42*  GLUCOSE 174*  < > 214*  BUN 26*  < > 26*  CREATININE 0.53  < > 0.56  CALCIUM 8.2*  < > 8.0*  MG 2.2  --   --   < > = values in this interval not  displayed. ------------------------------------------------------------------------------------------------------------------  Cardiac Enzymes  Recent Labs Lab 01/12/16 1106 01/12/16 1551  TROPONINI <0.03 <0.03   ------------------------------------------------------------------------------------------------------------------  RADIOLOGY:  No results found.   ASSESSMENT AND PLAN:   82 year old female who presents with acute hypoxic respiratory failure due to COPD exacerbation and diastolic heart failure.  --Acute on chronic diastolic CHF  Severe pulmonary hypertension on echocardiogram,  pressures were >90 mmHg (normal <30) much of her breathing is secondary to fluid overload , acute on chronic diastolic CHF Exacerbated by anemia Elevated right heart pressures likely also responsible for hemoptysis  -Would continue Bumex IV every 8hrs, No significant change in renal function to suggest dehydration, given her continued shortness of breath, would continue IV diuresis otherwise she is high risk of recurrent admission  --Pulmonary hypertension  In conjunction with acute on chronic diastolic CHF  Sodium dropping, bun climbing Will change to bumex 1 mg po BID oral dose, D/C IV bumex  --- COPD  Appears relatively stable  Just completed prednisone, antibiotics as outpatient   --- Arrhythmia  Seen by Dr. Graciela Husbands over the weekend, recommendation has been made to stop amiodarone  No new arrhythmia on tele  -- Acute hypoxic respiratory failure:   syncope and near-syncope episodes is likely secondary to hypoxia  Episodes of hypoxia secondary to severe high right-sided pressures /pulmoary HTN Continue diuretics as above, she has had no further epsiodes  --Essential hypertension:  Continue hydralazine, Bystolic, Aldactone, Cardura, stable  --Depression:  Seen by psychiatry   --PAF:  Continue aspirin. No anticoagulation due to history of falls  Bystolic for heart rate  control.  --Acute blood loss anemia:  status post 1 unit PRBC.  Blood count remains low,  Iron restarted.  -- Deconditioned Needs PT  Long discussion with daughter concerning the plan Consider d/c to [facility]   Total encounter time more than 35 minutes  Greater than 50% was spent in counseling and coordination of care with the patient  Signed, Dossie Arbour, MD, Ph.D [facility]

## 2016-01-19 NOTE — Care Management Important Message (Signed)
Important Message  Patient Details  Name: Catherine Holmes MRN: [id number] Date of Birth: [birth year]   Medicare Important Message Given:  Yes    Chapman FitchBOWEN, Yehya Brendle T, RN 01/19/2016, 11:36 AM

## 2016-01-19 NOTE — Progress Notes (Signed)
Called [facility]  For update about what time they were coming for patient i was told that they are are having technical issues and that it might take a little longer maybe an 1 h long .

## 2016-01-19 NOTE — Progress Notes (Signed)
Pt being discharged to [facility]. [facility] Pt A&Ox4, VSS, no acute distress noted. Family member notified via telephone that pt is being transported now. Syliva Overmanassie A Demitrus Francisco, RN

## 2016-02-02 ENCOUNTER — Inpatient Hospital Stay: Payer: Medicare Other

## 2016-02-02 ENCOUNTER — Other Ambulatory Visit: Payer: Self-pay | Admitting: Hematology and Oncology

## 2016-02-02 ENCOUNTER — Inpatient Hospital Stay: Payer: Medicare Other | Admitting: Hematology and Oncology

## 2016-02-02 LAB — CULTURE, BLOOD (ROUTINE X 2)
Culture: NO GROWTH
Culture: NO GROWTH

## 2016-02-03 ENCOUNTER — Emergency Department: Payer: Medicare Other

## 2016-02-03 ENCOUNTER — Inpatient Hospital Stay
Admission: EM | Admit: 2016-02-03 | Discharge: 2016-02-20 | DRG: 064 | Disposition: E | Payer: Medicare Other | Attending: Pulmonary Disease | Admitting: Pulmonary Disease

## 2016-02-03 ENCOUNTER — Inpatient Hospital Stay: Payer: Medicare Other

## 2016-02-03 ENCOUNTER — Encounter: Payer: Self-pay | Admitting: Emergency Medicine

## 2016-02-03 DIAGNOSIS — Z7951 Long term (current) use of inhaled steroids: Secondary | ICD-10-CM

## 2016-02-03 DIAGNOSIS — E1165 Type 2 diabetes mellitus with hyperglycemia: Secondary | ICD-10-CM | POA: Diagnosis present

## 2016-02-03 DIAGNOSIS — I5032 Chronic diastolic (congestive) heart failure: Secondary | ICD-10-CM | POA: Diagnosis present

## 2016-02-03 DIAGNOSIS — Z886 Allergy status to analgesic agent status: Secondary | ICD-10-CM | POA: Diagnosis not present

## 2016-02-03 DIAGNOSIS — Z7982 Long term (current) use of aspirin: Secondary | ICD-10-CM

## 2016-02-03 DIAGNOSIS — J9690 Respiratory failure, unspecified, unspecified whether with hypoxia or hypercapnia: Secondary | ICD-10-CM

## 2016-02-03 DIAGNOSIS — J440 Chronic obstructive pulmonary disease with acute lower respiratory infection: Secondary | ICD-10-CM | POA: Diagnosis present

## 2016-02-03 DIAGNOSIS — Z9842 Cataract extraction status, left eye: Secondary | ICD-10-CM

## 2016-02-03 DIAGNOSIS — Z8701 Personal history of pneumonia (recurrent): Secondary | ICD-10-CM

## 2016-02-03 DIAGNOSIS — R4020 Unspecified coma: Secondary | ICD-10-CM | POA: Diagnosis not present

## 2016-02-03 DIAGNOSIS — G936 Cerebral edema: Secondary | ICD-10-CM | POA: Diagnosis present

## 2016-02-03 DIAGNOSIS — E875 Hyperkalemia: Secondary | ICD-10-CM | POA: Diagnosis present

## 2016-02-03 DIAGNOSIS — I63511 Cerebral infarction due to unspecified occlusion or stenosis of right middle cerebral artery: Secondary | ICD-10-CM | POA: Diagnosis present

## 2016-02-03 DIAGNOSIS — K219 Gastro-esophageal reflux disease without esophagitis: Secondary | ICD-10-CM | POA: Diagnosis present

## 2016-02-03 DIAGNOSIS — Z8249 Family history of ischemic heart disease and other diseases of the circulatory system: Secondary | ICD-10-CM

## 2016-02-03 DIAGNOSIS — J189 Pneumonia, unspecified organism: Secondary | ICD-10-CM | POA: Diagnosis present

## 2016-02-03 DIAGNOSIS — I341 Nonrheumatic mitral (valve) prolapse: Secondary | ICD-10-CM | POA: Diagnosis present

## 2016-02-03 DIAGNOSIS — R0682 Tachypnea, not elsewhere classified: Secondary | ICD-10-CM | POA: Diagnosis present

## 2016-02-03 DIAGNOSIS — Z9841 Cataract extraction status, right eye: Secondary | ICD-10-CM

## 2016-02-03 DIAGNOSIS — J9620 Acute and chronic respiratory failure, unspecified whether with hypoxia or hypercapnia: Secondary | ICD-10-CM | POA: Diagnosis present

## 2016-02-03 DIAGNOSIS — Z79899 Other long term (current) drug therapy: Secondary | ICD-10-CM | POA: Diagnosis not present

## 2016-02-03 DIAGNOSIS — Z880 Allergy status to penicillin: Secondary | ICD-10-CM | POA: Diagnosis not present

## 2016-02-03 DIAGNOSIS — Z90710 Acquired absence of both cervix and uterus: Secondary | ICD-10-CM | POA: Diagnosis not present

## 2016-02-03 DIAGNOSIS — R451 Restlessness and agitation: Secondary | ICD-10-CM | POA: Diagnosis present

## 2016-02-03 DIAGNOSIS — Z888 Allergy status to other drugs, medicaments and biological substances status: Secondary | ICD-10-CM | POA: Diagnosis not present

## 2016-02-03 DIAGNOSIS — D649 Anemia, unspecified: Secondary | ICD-10-CM | POA: Diagnosis present

## 2016-02-03 DIAGNOSIS — R0600 Dyspnea, unspecified: Secondary | ICD-10-CM

## 2016-02-03 DIAGNOSIS — J9602 Acute respiratory failure with hypercapnia: Secondary | ICD-10-CM

## 2016-02-03 DIAGNOSIS — I110 Hypertensive heart disease with heart failure: Secondary | ICD-10-CM | POA: Diagnosis present

## 2016-02-03 DIAGNOSIS — Z7952 Long term (current) use of systemic steroids: Secondary | ICD-10-CM | POA: Diagnosis not present

## 2016-02-03 DIAGNOSIS — J9811 Atelectasis: Secondary | ICD-10-CM | POA: Diagnosis present

## 2016-02-03 DIAGNOSIS — Z8673 Personal history of transient ischemic attack (TIA), and cerebral infarction without residual deficits: Secondary | ICD-10-CM

## 2016-02-03 DIAGNOSIS — Z515 Encounter for palliative care: Secondary | ICD-10-CM | POA: Diagnosis not present

## 2016-02-03 DIAGNOSIS — Z9049 Acquired absence of other specified parts of digestive tract: Secondary | ICD-10-CM | POA: Diagnosis not present

## 2016-02-03 DIAGNOSIS — I639 Cerebral infarction, unspecified: Secondary | ICD-10-CM

## 2016-02-03 DIAGNOSIS — G9340 Encephalopathy, unspecified: Secondary | ICD-10-CM | POA: Diagnosis present

## 2016-02-03 DIAGNOSIS — Z882 Allergy status to sulfonamides status: Secondary | ICD-10-CM | POA: Diagnosis not present

## 2016-02-03 DIAGNOSIS — Z87891 Personal history of nicotine dependence: Secondary | ICD-10-CM | POA: Diagnosis not present

## 2016-02-03 DIAGNOSIS — I63341 Cerebral infarction due to thrombosis of right cerebellar artery: Secondary | ICD-10-CM

## 2016-02-03 DIAGNOSIS — J9600 Acute respiratory failure, unspecified whether with hypoxia or hypercapnia: Secondary | ICD-10-CM

## 2016-02-03 DIAGNOSIS — N179 Acute kidney failure, unspecified: Secondary | ICD-10-CM | POA: Diagnosis present

## 2016-02-03 DIAGNOSIS — M810 Age-related osteoporosis without current pathological fracture: Secondary | ICD-10-CM | POA: Diagnosis present

## 2016-02-03 DIAGNOSIS — I63311 Cerebral infarction due to thrombosis of right middle cerebral artery: Secondary | ICD-10-CM | POA: Diagnosis not present

## 2016-02-03 DIAGNOSIS — Z66 Do not resuscitate: Secondary | ICD-10-CM | POA: Diagnosis present

## 2016-02-03 DIAGNOSIS — J9601 Acute respiratory failure with hypoxia: Secondary | ICD-10-CM

## 2016-02-03 DIAGNOSIS — R092 Respiratory arrest: Secondary | ICD-10-CM | POA: Diagnosis present

## 2016-02-03 LAB — BLOOD GAS, ARTERIAL
ACID-BASE EXCESS: 8.6 mmol/L — AB (ref 0.0–3.0)
ACID-BASE EXCESS: 7.1 mmol/L — AB (ref 0.0–3.0)
ALLENS TEST (PASS/FAIL): POSITIVE — AB
ALLENS TEST (PASS/FAIL): POSITIVE — AB
BICARBONATE: 35.9 meq/L — AB (ref 21.0–28.0)
Bicarbonate: 33 mEq/L — ABNORMAL HIGH (ref 21.0–28.0)
FIO2: 0.6
FIO2: 0.6
MECHVT: 450 mL
O2 SAT: 84.4 %
O2 Saturation: 98.8 %
PATIENT TEMPERATURE: 37
PATIENT TEMPERATURE: 37
PEEP: 5 cmH2O
PEEP: 5 cmH2O
PH ART: 7.33 — AB (ref 7.350–7.450)
PO2 ART: 53 mmHg — AB (ref 83.0–108.0)
RATE: 20 resp/min
RATE: 22 resp/min
VT: 450 mL
pCO2 arterial: 68 mmHg — ABNORMAL HIGH (ref 32.0–48.0)
pCO2 arterial: 52 mmHg — ABNORMAL HIGH (ref 32.0–48.0)
pH, Arterial: 7.41 (ref 7.350–7.450)
pO2, Arterial: 122 mmHg — ABNORMAL HIGH (ref 83.0–108.0)

## 2016-02-03 LAB — PROTIME-INR
INR: 1.28
Prothrombin Time: 16.1 seconds — ABNORMAL HIGH (ref 11.4–15.0)

## 2016-02-03 LAB — COMPREHENSIVE METABOLIC PANEL
ALBUMIN: 3.6 g/dL (ref 3.5–5.0)
ALK PHOS: 101 U/L (ref 38–126)
ALT: 187 U/L — ABNORMAL HIGH (ref 14–54)
ANION GAP: 10 (ref 5–15)
AST: 223 U/L — ABNORMAL HIGH (ref 15–41)
BILIRUBIN TOTAL: 0.8 mg/dL (ref 0.3–1.2)
BUN: 39 mg/dL — AB (ref 6–20)
CALCIUM: 8.7 mg/dL — AB (ref 8.9–10.3)
CO2: 36 mmol/L — ABNORMAL HIGH (ref 22–32)
Chloride: 89 mmol/L — ABNORMAL LOW (ref 101–111)
Creatinine, Ser: 1.62 mg/dL — ABNORMAL HIGH (ref 0.44–1.00)
GFR calc Af Amer: 32 mL/min — ABNORMAL LOW (ref >60)
GFR calc non Af Amer: 28 mL/min — ABNORMAL LOW (ref >60)
GLUCOSE: 232 mg/dL — AB (ref 65–99)
Potassium: 5.3 mmol/L — ABNORMAL HIGH (ref 3.5–5.1)
Sodium: 135 mmol/L (ref 135–145)
TOTAL PROTEIN: 6.7 g/dL (ref 6.5–8.1)

## 2016-02-03 LAB — CBC WITH DIFFERENTIAL/PLATELET
Basophils Absolute: 0.1 10*3/uL (ref 0–0.1)
Basophils Relative: 1 %
EOS ABS: 0 10*3/uL (ref 0–0.7)
EOS PCT: 0 %
HEMATOCRIT: 31.7 % — AB (ref 35.0–47.0)
HEMOGLOBIN: 10.5 g/dL — AB (ref 12.0–16.0)
Lymphocytes Relative: 6 %
Lymphs Abs: 1.2 10*3/uL (ref 1.0–3.6)
MCH: 28 pg (ref 26.0–34.0)
MCHC: 33.1 g/dL (ref 32.0–36.0)
MCV: 84.5 fL (ref 80.0–100.0)
MONOS PCT: 4 %
Monocytes Absolute: 0.7 10*3/uL (ref 0.2–0.9)
NEUTROS PCT: 89 %
Neutro Abs: 17.9 10*3/uL — ABNORMAL HIGH (ref 1.4–6.5)
Platelets: 339 10*3/uL (ref 150–440)
RBC: 3.75 MIL/uL — ABNORMAL LOW (ref 3.80–5.20)
RDW: 22.9 % — ABNORMAL HIGH (ref 11.5–14.5)
WBC: 20 10*3/uL — ABNORMAL HIGH (ref 3.6–11.0)

## 2016-02-03 LAB — GLUCOSE, CAPILLARY
GLUCOSE-CAPILLARY: 241 mg/dL — AB (ref 65–99)
GLUCOSE-CAPILLARY: 263 mg/dL — AB (ref 65–99)
GLUCOSE-CAPILLARY: 257 mg/dL — AB (ref 65–99)
GLUCOSE-CAPILLARY: 316 mg/dL — AB (ref 65–99)
Glucose-Capillary: 200 mg/dL — ABNORMAL HIGH (ref 65–99)
Glucose-Capillary: 324 mg/dL — ABNORMAL HIGH (ref 65–99)

## 2016-02-03 LAB — LACTIC ACID, PLASMA: Lactic Acid, Venous: 1.9 mmol/L (ref 0.5–2.0)

## 2016-02-03 LAB — LIPASE, BLOOD

## 2016-02-03 LAB — MRSA PCR SCREENING: MRSA by PCR: NEGATIVE

## 2016-02-03 LAB — TROPONIN I
TROPONIN I: 0.41 ng/mL — AB (ref <0.031)
Troponin I: 0.46 ng/mL — ABNORMAL HIGH (ref <0.031)
Troponin I: 0.4 ng/mL — ABNORMAL HIGH (ref <0.031)

## 2016-02-03 LAB — BRAIN NATRIURETIC PEPTIDE: B Natriuretic Peptide: 791 pg/mL — ABNORMAL HIGH (ref 0.0–100.0)

## 2016-02-03 MED ORDER — FENTANYL CITRATE (PF) 100 MCG/2ML IJ SOLN
50.0000 ug | INTRAMUSCULAR | Status: DC | PRN
  Administered 2016-02-03: 50 ug via INTRAVENOUS
  Filled 2016-02-03: qty 2

## 2016-02-03 MED ORDER — VANCOMYCIN HCL IN DEXTROSE 750-5 MG/150ML-% IV SOLN
750.0000 mg | INTRAVENOUS | Status: DC
  Administered 2016-02-04 – 2016-02-05 (×2): 750 mg via INTRAVENOUS
  Filled 2016-02-03 (×3): qty 150

## 2016-02-03 MED ORDER — ONDANSETRON HCL 4 MG/2ML IJ SOLN: 4.0000 mg | Freq: Four times a day (QID) | INTRAMUSCULAR | Status: DC | PRN

## 2016-02-03 MED ORDER — ETOMIDATE 2 MG/ML IV SOLN
10.0000 mg | Freq: Once | INTRAVENOUS | Status: AC
  Administered 2016-02-03: 10 mg via INTRAVENOUS

## 2016-02-03 MED ORDER — DOCUSATE SODIUM 50 MG/5ML PO LIQD
100.0000 mg | Freq: Two times a day (BID) | ORAL | Status: DC | PRN
  Filled 2016-02-03: qty 10

## 2016-02-03 MED ORDER — DOPAMINE-DEXTROSE 3.2-5 MG/ML-% IV SOLN
5.0000 ug/kg/min | Freq: Once | INTRAVENOUS | Status: DC
  Administered 2016-02-03: 5 ug/kg/min via INTRAVENOUS

## 2016-02-03 MED ORDER — INSULIN ASPART 100 UNIT/ML ~~LOC~~ SOLN
0.0000 [IU] | SUBCUTANEOUS | Status: DC
  Administered 2016-02-03: 15 [IU] via SUBCUTANEOUS
  Administered 2016-02-04: 7 [IU] via SUBCUTANEOUS
  Administered 2016-02-04: 4 [IU] via SUBCUTANEOUS
  Administered 2016-02-04 (×2): 7 [IU] via SUBCUTANEOUS
  Administered 2016-02-04: 11 [IU] via SUBCUTANEOUS
  Administered 2016-02-05 (×3): 4 [IU] via SUBCUTANEOUS
  Administered 2016-02-05: 7 [IU] via SUBCUTANEOUS
  Administered 2016-02-05: 4 [IU] via SUBCUTANEOUS
  Administered 2016-02-05: 15 [IU] via SUBCUTANEOUS
  Administered 2016-02-05: 11 [IU] via SUBCUTANEOUS
  Administered 2016-02-06: 7 [IU] via SUBCUTANEOUS
  Administered 2016-02-06: 4 [IU] via SUBCUTANEOUS
  Administered 2016-02-06 (×2): 7 [IU] via SUBCUTANEOUS
  Filled 2016-02-03 (×2): qty 7
  Filled 2016-02-03: qty 4
  Filled 2016-02-03: qty 11
  Filled 2016-02-03: qty 10
  Filled 2016-02-03: qty 15
  Filled 2016-02-03 (×3): qty 4
  Filled 2016-02-03 (×2): qty 7
  Filled 2016-02-03: qty 4
  Filled 2016-02-03: qty 11
  Filled 2016-02-03 (×2): qty 7
  Filled 2016-02-03: qty 15
  Filled 2016-02-03: qty 7

## 2016-02-03 MED ORDER — SODIUM CHLORIDE 0.9 % IV SOLN
1.0000 mg/h | INTRAVENOUS | Status: DC
  Administered 2016-02-03: 1 mg/h via INTRAVENOUS
  Filled 2016-02-03: qty 10

## 2016-02-03 MED ORDER — VANCOMYCIN HCL IN DEXTROSE 1-5 GM/200ML-% IV SOLN
1000.0000 mg | Freq: Once | INTRAVENOUS | Status: AC
  Administered 2016-02-03: 1000 mg via INTRAVENOUS
  Filled 2016-02-03: qty 200

## 2016-02-03 MED ORDER — DOPAMINE-DEXTROSE 3.2-5 MG/ML-% IV SOLN: 0.0000 ug/kg/min | INTRAVENOUS | Status: DC

## 2016-02-03 MED ORDER — FREE WATER
100.0000 mL | Freq: Three times a day (TID) | Status: DC
  Administered 2016-02-03 – 2016-02-06 (×10): 100 mL
  Filled 2016-02-03 (×3): qty 100

## 2016-02-03 MED ORDER — MIDAZOLAM HCL 2 MG/2ML IJ SOLN
2.0000 mg | INTRAMUSCULAR | Status: DC | PRN
  Administered 2016-02-03 – 2016-02-06 (×5): 2 mg via INTRAVENOUS
  Filled 2016-02-03: qty 4
  Filled 2016-02-03: qty 2
  Filled 2016-02-03: qty 4
  Filled 2016-02-03 (×2): qty 2

## 2016-02-03 MED ORDER — LACTATED RINGERS IV SOLN
INTRAVENOUS | Status: DC
  Administered 2016-02-03 – 2016-02-05 (×3): via INTRAVENOUS
  Administered 2016-02-06: 50 mL/h via INTRAVENOUS

## 2016-02-03 MED ORDER — PANTOPRAZOLE SODIUM 40 MG IV SOLR
40.0000 mg | Freq: Every day | INTRAVENOUS | Status: DC
  Administered 2016-02-03 – 2016-02-05 (×3): 40 mg via INTRAVENOUS
  Filled 2016-02-03 (×4): qty 40

## 2016-02-03 MED ORDER — SODIUM CHLORIDE 0.9 % IV BOLUS (SEPSIS)
1000.0000 mL | INTRAVENOUS | Status: AC
  Administered 2016-02-03 (×2): 1000 mL via INTRAVENOUS

## 2016-02-03 MED ORDER — MIDAZOLAM HCL 2 MG/2ML IJ SOLN
1.0000 mg | INTRAMUSCULAR | Status: DC | PRN
Start: 2016-02-03 — End: 2016-02-03
  Administered 2016-02-03 (×2): 1 mg via INTRAVENOUS
  Filled 2016-02-03: qty 2

## 2016-02-03 MED ORDER — SUCCINYLCHOLINE CHLORIDE 20 MG/ML IJ SOLN
140.0000 mg | Freq: Once | INTRAMUSCULAR | Status: AC
  Administered 2016-02-03: 140 mg via INTRAVENOUS

## 2016-02-03 MED ORDER — ANTISEPTIC ORAL RINSE SOLUTION (CORINZ)
7.0000 mL | Freq: Four times a day (QID) | OROMUCOSAL | Status: DC
  Administered 2016-02-04 – 2016-02-06 (×12): 7 mL via OROMUCOSAL
  Filled 2016-02-03 (×14): qty 7

## 2016-02-03 MED ORDER — INSULIN ASPART 100 UNIT/ML ~~LOC~~ SOLN
0.0000 [IU] | SUBCUTANEOUS | Status: DC
  Administered 2016-02-03: 11 [IU] via SUBCUTANEOUS
  Administered 2016-02-03 (×2): 8 [IU] via SUBCUTANEOUS
  Filled 2016-02-03 (×2): qty 8
  Filled 2016-02-03: qty 11

## 2016-02-03 MED ORDER — DEXTROSE 50 % IV SOLN
INTRAVENOUS | Status: AC
  Filled 2016-02-03: qty 50

## 2016-02-03 MED ORDER — BUDESONIDE 0.25 MG/2ML IN SUSP
0.2500 mg | Freq: Four times a day (QID) | RESPIRATORY_TRACT | Status: DC
  Administered 2016-02-03 – 2016-02-06 (×13): 0.25 mg via RESPIRATORY_TRACT
  Filled 2016-02-03 (×12): qty 2

## 2016-02-03 MED ORDER — ACETAMINOPHEN 325 MG PO TABS: 650.0000 mg | ORAL_TABLET | ORAL | Status: DC | PRN

## 2016-02-03 MED ORDER — SODIUM CHLORIDE 0.9 % IV SOLN: 250.0000 mL | INTRAVENOUS | Status: DC | PRN

## 2016-02-03 MED ORDER — ASPIRIN 300 MG RE SUPP
RECTAL | Status: AC
  Administered 2016-02-03: 300 mg via RECTAL
  Filled 2016-02-03: qty 1

## 2016-02-03 MED ORDER — IPRATROPIUM-ALBUTEROL 0.5-2.5 (3) MG/3ML IN SOLN
3.0000 mL | Freq: Four times a day (QID) | RESPIRATORY_TRACT | Status: DC
  Administered 2016-02-03 – 2016-02-06 (×13): 3 mL via RESPIRATORY_TRACT
  Filled 2016-02-03 (×12): qty 3

## 2016-02-03 MED ORDER — FENTANYL 2500MCG IN NS 250ML (10MCG/ML) PREMIX INFUSION
10.0000 ug/h | INTRAVENOUS | Status: DC
  Administered 2016-02-03: 10 ug/h via INTRAVENOUS
  Filled 2016-02-03: qty 250

## 2016-02-03 MED ORDER — DEXTROSE 5 % IV SOLN
2.0000 g | INTRAVENOUS | Status: DC
  Administered 2016-02-03 – 2016-02-05 (×3): 2 g via INTRAVENOUS
  Filled 2016-02-03 (×4): qty 2

## 2016-02-03 MED ORDER — SODIUM CHLORIDE 0.9 % IV BOLUS (SEPSIS): 500.0000 mL | INTRAVENOUS | Status: DC

## 2016-02-03 MED ORDER — FENTANYL 2500MCG IN NS 250ML (10MCG/ML) PREMIX INFUSION
25.0000 ug/h | INTRAVENOUS | Status: DC
  Administered 2016-02-04: 350 ug/h via INTRAVENOUS
  Administered 2016-02-04: 50 ug/h via INTRAVENOUS
  Administered 2016-02-05 (×4): 400 ug/h via INTRAVENOUS
  Administered 2016-02-05: 50 ug/h via INTRAVENOUS
  Administered 2016-02-06: 400 ug/h via INTRAVENOUS
  Filled 2016-02-03 (×7): qty 250

## 2016-02-03 MED ORDER — MIDAZOLAM HCL 2 MG/2ML IJ SOLN
1.0000 mg | INTRAMUSCULAR | Status: AC
  Administered 2016-02-03: 1 mg via INTRAVENOUS
  Filled 2016-02-03: qty 2

## 2016-02-03 MED ORDER — MIDAZOLAM HCL 2 MG/2ML IJ SOLN
1.0000 mg | INTRAMUSCULAR | Status: DC | PRN
  Administered 2016-02-03: 1 mg via INTRAVENOUS
  Filled 2016-02-03 (×2): qty 2

## 2016-02-03 MED ORDER — MIDAZOLAM HCL 2 MG/2ML IJ SOLN
2.0000 mg | INTRAMUSCULAR | Status: AC | PRN
  Administered 2016-02-04 (×2): 2 mg via INTRAVENOUS
  Filled 2016-02-03: qty 4

## 2016-02-03 MED ORDER — ALBUTEROL SULFATE (2.5 MG/3ML) 0.083% IN NEBU: 2.5000 mg | INHALATION_SOLUTION | RESPIRATORY_TRACT | Status: DC | PRN

## 2016-02-03 MED ORDER — CHLORHEXIDINE GLUCONATE 0.12% ORAL RINSE (MEDLINE KIT)
15.0000 mL | Freq: Two times a day (BID) | OROMUCOSAL | Status: DC
  Administered 2016-02-03 – 2016-02-06 (×6): 15 mL via OROMUCOSAL
  Filled 2016-02-03 (×8): qty 15

## 2016-02-03 MED ORDER — HYDROCORTISONE NA SUCCINATE PF 100 MG IJ SOLR
50.0000 mg | Freq: Two times a day (BID) | INTRAMUSCULAR | Status: DC
  Administered 2016-02-03 – 2016-02-04 (×2): 50 mg via INTRAVENOUS
  Filled 2016-02-03 (×2): qty 2

## 2016-02-03 MED ORDER — ASPIRIN 300 MG RE SUPP
300.0000 mg | Freq: Once | RECTAL | Status: DC
  Administered 2016-02-03: 300 mg via RECTAL

## 2016-02-03 MED ORDER — SODIUM CHLORIDE 0.9 % IV SOLN
1.0000 g | INTRAVENOUS | Status: AC
  Administered 2016-02-03: 1 g via INTRAVENOUS
  Filled 2016-02-03: qty 1

## 2016-02-03 MED ORDER — BISACODYL 10 MG RE SUPP
10.0000 mg | Freq: Every day | RECTAL | Status: DC | PRN
  Filled 2016-02-03: qty 1

## 2016-02-03 MED ORDER — HEPARIN SODIUM (PORCINE) 5000 UNIT/ML IJ SOLN
5000.0000 [IU] | Freq: Three times a day (TID) | INTRAMUSCULAR | Status: DC
  Administered 2016-02-03 – 2016-02-06 (×10): 5000 [IU] via SUBCUTANEOUS
  Filled 2016-02-03 (×9): qty 1

## 2016-02-03 MED ORDER — VITAL HIGH PROTEIN PO LIQD
1000.0000 mL | ORAL | Status: DC
  Administered 2016-02-03: 1000 mL

## 2016-02-03 MED ORDER — METHYLPREDNISOLONE SODIUM SUCC 125 MG IJ SOLR
125.0000 mg | INTRAMUSCULAR | Status: AC
  Administered 2016-02-03: 125 mg via INTRAVENOUS
  Filled 2016-02-03: qty 2

## 2016-02-03 MED ORDER — FENTANYL CITRATE (PF) 100 MCG/2ML IJ SOLN
12.5000 ug | Freq: Once | INTRAMUSCULAR | Status: AC
  Administered 2016-02-03: 12.5 ug via INTRAVENOUS
  Filled 2016-02-03: qty 2

## 2016-02-03 NOTE — ED Notes (Signed)
Pt to CT

## 2016-02-03 NOTE — ED Provider Notes (Addendum)
[facility] Emergency Department Provider Note  ____________________________________________  Time seen: Approximately 8:44 AM  I have reviewed the triage vital signs and the nursing notes.   HISTORY  Chief Complaint Respiratory Distress  EM caveat: Patient unresponsive  HPI Catherine Holmes is a 82 y.o. female history of previous COPD and respiratory failure, recently discharged less than one month from the hospital.  The patient presents todayin severe respiratory distress at her nursing facility. There is a little information is available but EMS reports that she is "peri-arrest" and she is currently tolerating bag valve masking, essentially unresponsive.   Past Medical History  Diagnosis Date  . Essential hypertension   . Diabetes mellitus type II, controlled (HCC)   . COPD (chronic obstructive pulmonary disease) (HCC)   . Osteoporosis   . Carotid arterial disease (HCC)     a. 2012 Carotid dopplers: 40-59% R ICA, 60-79% L ICA  . Tic douloureux   . Arthralgia   . Sjoegren syndrome (HCC)   . TIA (transient ischemic attack)   . Emphysema   . MVP (mitral valve prolapse)   . Chronic diastolic CHF (congestive heart failure) (HCC)     a. 08/2011 Echo: EF 65-70%, mild LVH, mod dil LA, mildly dil RA, mild MR, mild-mod AoV Sclerosis w/o stenosis, mod-sev elev PA pressures, mild TR.  Marland Kitchen Acid reflux   . Diverticulitis   . Anemia   . History of pneumonia   . Syncope and collapse   . Anemia   . Hip fracture, left (HCC)   . Hyperlipidemia   . Hiatal hernia   . Chest pain     a. 08/2011 MV: EF 74%, no ischemia.  . Noncompliance     a. h/o not taking bumex as Rx.  Marland Kitchen PAF (paroxysmal atrial fibrillation) (HCC)     a. CHA2DS2VASc = 8 ->No OAC 2/2 falls.    Patient Active Problem List   Diagnosis Date Noted  . Respiratory arrest (HCC) 02/08/2016  . Dyspnea   . Pulmonary HTN (HCC)   . SOB (shortness of breath)   . Symptomatic anemia   . Depression,  major, single episode, mild (HCC) 01/12/2016  . Acute respiratory failure (HCC) 01/10/2016  . Pressure ulcer 11/14/2015  . COPD exacerbation (HCC) 11/13/2015  . Acute on chronic respiratory failure (HCC) 11/13/2015  . Anxiety and depression 11/10/2015  . Iron deficiency anemia 10/28/2015  . Musculoskeletal chest pain 07/29/2015  . Sternal fracture 07/29/2015  . Pneumonia 07/29/2015  . Acute on chronic respiratory failure with hypoxia and hypercapnia (HCC) 07/28/2015  . Hyponatremia 04/11/2015  . Syncope 04/10/2015  . Absolute anemia   . Acute on chronic diastolic CHF (congestive heart failure) (HCC)   . CHF (congestive heart failure) (HCC) 03/31/2015  . Essential hypertension   . Diabetes mellitus type II, controlled (HCC)   . Hyposmolality and/or hyponatremia 12/04/2013  . Anemia 12/04/2013  . Chronic diastolic CHF (congestive heart failure) (HCC) 04/05/2013  . Positive fecal occult blood test 04/05/2013  . Fall 11/20/2012  . Chest tightness 07/13/2011  . Diabetes type 2, uncontrolled (HCC) 11/29/2009  . Hyperlipidemia 11/29/2009  . TIC DOULOUREUX 11/29/2009  . HYPERTENSION, BENIGN 11/29/2009  . Mitral valve disorder 11/29/2009  . Congestive heart failure (HCC) 11/29/2009  . Carotid arterial disease (HCC) 11/29/2009  . TIA 11/29/2009  . PAD (peripheral artery disease) (HCC) 11/29/2009  . EMPHYSEMA 11/29/2009  . COPD (chronic obstructive pulmonary disease) (HCC) 11/29/2009  . GERD 11/29/2009  . SJOGREN'S SYNDROME 11/29/2009  .  ARTHRALGIA 11/29/2009  . OSTEOPOROSIS 11/29/2009  . DYSPNEA 11/29/2009    Past Surgical History  Procedure Laterality Date  . Cholecystectomy  1965  . Abdominal hysterectomy  1964  . Cataract extraction Bilateral   . Hip fracture surgery  2014    left hip    No current outpatient prescriptions on file.  Allergies Singlet; Atorvastatin; Clindamycin; Codeine; Doxycycline; Epinephrine; Erythromycin; Fluticasone-salmeterol; Lasix; Levofloxacin;  Penicillins; Propoxyphene hcl; Rosuvastatin; Simvastatin; Sulfamethoxazole-trimethoprim; Teriparatide; Tetracyclines & related; Ceclor; Glipizide; and Lisinopril  Please note the patient has multiple allergic reactions listed for antibiotics, for which many are unknown. They have discussed with pharmacy and there is some delay in antibiotic administration is pharmacy assist me in deciding on a safe antibiotic for administration.  Family History  Problem Relation Age of Onset  . Heart attack Mother   . Heart attack Father   . Heart attack Sister     Social History Social History  Substance Use Topics  . Smoking status: Former Smoker -- 0.50 packs/day for 30 years    Types: Cigarettes    Quit date: 10/22/1994  . Smokeless tobacco: Never Used  . Alcohol Use: Yes    Review of Systems EM caveat: Patient unresponsive    ____________________________________________   PHYSICAL EXAM:  VITAL SIGNS: ED Triage Vitals  Enc Vitals Group     BP --      Pulse --      Resp --      Temp --      Temp src --      SpO2 --      Weight --      Height --      Head Cir --      [facility] Flow --      Pain Score --      Pain Loc --      Pain Edu? --      Excl. in GC? --    Constitutional: Completely unresponsive, agonal respirations. No spontaneous movements. Eyes: Conjunctivae are normal. Head: Atraumatic. Nose: No congestion/rhinnorhea. Mouth/Throat: Mucous membranes are fairly dry.  Oropharynx non-erythematous however there is purulence noted in the oropharynx seems to be coming up from the lungs. Neck: No stridor.   Cardiovascular: Normal rate, regular rhythm.  Respiratory: Normal respiratory effort.  No retractions. Lungs CTAB. Gastrointestinal: Soft No distention. Musculoskeletal: No lower extremity tenderness Neurologic: Unresponsive, flaccid all extremities  Skin:  Skin is warm, dry and intact. No rash noted.   ____________________________________________   LABS (all labs  ordered are listed, but only abnormal results are displayed)  Labs Reviewed  GLUCOSE, CAPILLARY - Abnormal; Notable for the following:    Glucose-Capillary 200 (*)    All other components within normal limits  COMPREHENSIVE METABOLIC PANEL - Abnormal; Notable for the following:    Potassium 5.3 (*)    Chloride 89 (*)    CO2 36 (*)    Glucose, Bld 232 (*)    BUN 39 (*)    Creatinine, Ser 1.62 (*)    Calcium 8.7 (*)    AST 223 (*)    ALT 187 (*)    GFR calc non Af Amer 28 (*)    GFR calc Af Amer 32 (*)    All other components within normal limits  TROPONIN I - Abnormal; Notable for the following:    Troponin I 0.46 (*)    All other components within normal limits  CBC WITH DIFFERENTIAL/PLATELET - Abnormal; Notable for the following:  WBC 20.0 (*)    RBC 3.75 (*)    Hemoglobin 10.5 (*)    HCT 31.7 (*)    RDW 22.9 (*)    Neutro Abs 17.9 (*)    All other components within normal limits  PROTIME-INR - Abnormal; Notable for the following:    Prothrombin Time 16.1 (*)    All other components within normal limits  LIPASE, BLOOD - Abnormal; Notable for the following:    Lipase <10 (*)    All other components within normal limits  BRAIN NATRIURETIC PEPTIDE - Abnormal; Notable for the following:    B Natriuretic Peptide 791.0 (*)    All other components within normal limits  BLOOD GAS, ARTERIAL - Abnormal; Notable for the following:    pH, Arterial 7.33 (*)    pCO2 arterial 68 (*)    pO2, Arterial 53 (*)    Bicarbonate 35.9 (*)    Acid-Base Excess 8.6 (*)    Allens test (pass/fail) POSITIVE (*)    All other components within normal limits  BLOOD GAS, ARTERIAL - Abnormal; Notable for the following:    pCO2 arterial 52 (*)    pO2, Arterial 122 (*)    Bicarbonate 33.0 (*)    Acid-Base Excess 7.1 (*)    Allens test (pass/fail) POSITIVE (*)    All other components within normal limits  GLUCOSE, CAPILLARY - Abnormal; Notable for the following:    Glucose-Capillary 241 (*)     All other components within normal limits  GLUCOSE, CAPILLARY - Abnormal; Notable for the following:    Glucose-Capillary 263 (*)    All other components within normal limits  CULTURE, BLOOD (ROUTINE X 2)  CULTURE, BLOOD (ROUTINE X 2)  URINE CULTURE  CULTURE, BLOOD (ROUTINE X 2)  CULTURE, BLOOD (ROUTINE X 2)  CULTURE, RESPIRATORY (NON-EXPECTORATED)  MRSA PCR SCREENING  LACTIC ACID, PLASMA  TROPONIN I  TROPONIN I   ____________________________________________  EKG  Reviewed and interpreted by me at 8:35 AM Sinus rhythm Heart rate 96 Borderline right axis deviation There is minimal approximately just less than 1 cm ST elevation noted in V1, V2, V3, however no reciprocal changes are noted.  Does not quite meet STEMI criteria as less than 1 mm noted in multiple anteroseptal leads ____________________________________________  RADIOLOGY  DG Chest Port 1 View (Final result) Result time: Mar 03, 2016 09:02:46   Final result by Rad Results In Interface (03-03-2016 09:02:46)   Narrative:   CLINICAL DATA: Sepsis. Unresponsive.  EXAM: PORTABLE CHEST 1 VIEW  COMPARISON: January 15, 2016.  FINDINGS: Stable cardiomegaly. No pneumothorax or significant pleural effusion is noted. Endotracheal tube is in grossly good position with distal tip 4 cm above the carina. Left lung is clear. Mild right basilar opacity is noted concerning for atelectasis or possibly pneumonia. Bony thorax is unremarkable.  IMPRESSION: Endotracheal tube in grossly good position. Mild right basilar opacity concerning for atelectasis or possibly pneumonia.   Electronically Signed By: Lupita Raider, M.D. On: 2016/03/03 09:02   CT Head Wo Contrast (Final result) Result time: 03-03-2016 11:00:04   Final result by Rad Results In Interface (03-03-2016 11:00:04)   Narrative:   CLINICAL DATA: Acute change in mental status. Hypoxia  EXAM: CT HEAD WITHOUT CONTRAST  TECHNIQUE: Contiguous axial images  were obtained from the base of the skull through the vertex without intravenous contrast.  COMPARISON: 07/09/2013  FINDINGS: Skull and Sinuses:Negative for fracture or destructive process. The visualized mastoids, middle ears, and imaged paranasal sinuses are clear. Endotracheal and  enteric intubation  Visualized orbits: Negative.  Brain: Cytotoxic pattern edema throughout the entire right MCA territory. There is effacement of the right lateral ventricle without herniation or significant midline shift. No hemorrhage or hydrocephalus. No other site of acute infarct detected. Chronic appearing lacunar infarct in the right thalamus and left caudate head.  Critical Value/emergent results were called by telephone at the time of interpretation on 01/30/2016 at 10:59 am to Dr. Sharyn Creamer , who verbally acknowledged these results.  IMPRESSION: Acute nonhemorrhagic infarction of the entire right MCA territory.   Electronically Signed By: Marnee Spring M.D. On: 02/04/2016 11:00       ____________________________________________   PROCEDURES  Procedure(s) performed: intubation  Critical Care performed: Yes, see critical care note(s)  CRITICAL CARE Performed by: Sharyn Creamer   Total critical care time: 90 minutes  Critical care time was exclusive of separately billable procedures and treating other patients.  Critical care was necessary to treat or prevent imminent or life-threatening deterioration.  Critical care was time spent personally by me on the following activities: development of treatment plan with patient and/or surrogate as well as nursing, discussions with consultants, evaluation of patient's response to treatment, examination of patient, obtaining history from patient or surrogate, ordering and performing treatments and interventions, ordering and review of laboratory studies, ordering and review of radiographic studies, pulse oximetry and re-evaluation of  patient's condition.  ____________________________________________   INITIAL IMPRESSION / ASSESSMENT AND PLAN / ED COURSE  Pertinent labs & imaging results that were available during my care of the patient were reviewed by me and considered in my medical decision making (see chart for details).  She presents in severe respiratory distress, unresponsive requiring bag-valve-mask.  The patient's differential diagnosis extremely broad including cardiac, respiratory, infectious, toxic, metabolic, etc. We will initiate a very broad workup. Airway is secured, and the patient's hypoxia has improved after intubation. She did not have any desaturation and the presence of the ER.  I have contacted critical care medicine for consultation, in addition reviewed EKG with [facility] f cardiology who reviewed and agrees treat with ASA only at this time.  ED Sepsis - Repeat Assessment   Performed at:    9:20 AM  Last Vitals:    Pulse 88, resp. rate 23, SpO2 100 %.  Heart:      Regular, rate 88  Lungs:     Coarse and rhonchorous throughout, and title CO2 40 with very slight shark fin waveform  Capillary Refill:   One second  Peripheral Pulse (include location): Radial   Skin (include color):   Cool peripherally,  The patient became hypotensive shortly after intubation, currently receiving 2 L fluid bolus and has just started dopamine. Blood pressure now improved into the mid 80s on dopamine infusion.  ----------------------------------------- 9:49 AM on 02/05/2016 -----------------------------------------  Patient's blood pressure improving, continue to titrate to command. Fluids continuing. Antibiotics initiated. Suspect probable sepsis, respiratory failure, possible COPD at this point, however continue to rule out other diagnosis. Metabolic panel is still pending along with troponin at this time.  The patient's EKG abnormalities including ST elevation seemed to resolve now V1 and V2 and V3 no  longer demonstrating any ST elevation. Repeat EKG performed at 9:30 AM Normal sinus rhythm Heart rate 80 QRS 90 QTC 540 Q waves noted in a anterior distribution Reviewed and interpreted as normal sinus rhythm, probable old anterior MI, ST changes now resolved.  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  Indications: Unresponsive, respiratory failure   Intubation method: Glidescope Laryngoscopy   Preoxygenation: BVM  Sedatives: Etomidate Paralytic: Succinylcholine  Tube Size: 7.5 cuffed  Post-procedure assessment: chest rise and ETCO2 monitor Breath sounds: equal and absent over the epigastrium Tube secured with: ETT holder Chest x-ray interpreted by radiologist and me.  Chest x-ray findings: endotracheal tube in appropriate position  Patient tolerated the procedure well with no immediate complications.     Sharyn CreamerMark Quale, MD 02/09/16 [id number]

## 2016-02-03 NOTE — H&P (Addendum)
[facility]- then transferred to [facility].  Found unresponsive in the facility and sent to ED.  Head CT shows nonhemorrhagic infarction of the entire right MCA.  She is currently intubated and on the ventilator.  She is currently a full code.

## 2016-02-03 NOTE — Progress Notes (Signed)
Pharmacy Antibiotic Note  Catherine Holmes is a 82 y.o. female admitted on 02-29-16 with pneumonia.  Pharmacy has been consulted for vancomycin and cefepime dosing. Patient with multiple allergies apparently tolerated cephalosporins and meropenem recently per med history. Patient received one dose of meropenem in ED.   Plan: Vancomycin 1000 mg iv once followed by vancomycin 750 IV every 36 hours with stacked dosing. Will not order a trough at this time but will follow renal function closely and order levels/adjust dosing as clinically indicated. Goal trough 15-20 mcg/mL.  Cefepime 2 g iv q 24 hours starting 12 hours after meropenem dose.      No data recorded.   Recent Labs Lab 11-27-15 0835  WBC 20.0*  CREATININE 1.62*  LATICACIDVEN 1.9    CrCl cannot be calculated (Unknown ideal weight.).    Allergies  Allergen Reactions  . Singlet [Chlorphen-Pe-Acetaminophen] Shortness Of Breath  . Atorvastatin Other (See Comments)    Reaction:  Unknown   . Clindamycin Diarrhea and Nausea And Vomiting  . Codeine Other (See Comments)    Reaction:  Unknown   . Doxycycline Hives and Itching  . Epinephrine Hives and Itching  . Erythromycin Hives and Itching  . Fluticasone-Salmeterol Other (See Comments)    Reaction:  Unknown   . Lasix [Furosemide] Itching  . Levofloxacin Other (See Comments)    Reaction:  Unknown   . Penicillins Other (See Comments)    Reaction:  Unknown   . Propoxyphene Hcl Other (See Comments)    Reaction:  Unknown   . Rosuvastatin Other (See Comments)    Reaction:  Unknown   . Simvastatin Other (See Comments)    Reaction:  Unknown   . Sulfamethoxazole-Trimethoprim Other (See Comments)    Reaction:  Unknown   . Teriparatide Other (See Comments)    Reaction:  Unknown   . Tetracyclines & Related Hives and Itching  . Ceclor [Cefaclor] Hives and Other (See Comments)    Reaction:  Altered mental status    . Glipizide Hives and Itching  . Lisinopril Hives and Itching     Antimicrobials this admission: meropenem 4/14 >> 4/14 vancomycin 4/14 >>  Cefepime 4/14 >>  Dose adjustments this admission:  Microbiology results: BCx: pending UCx: pending  Sputum: pending   Thank you for allowing pharmacy to be a part of this patient's care.  Catherine Holmes, Catherine Holmes 02-29-16 10:42 AM

## 2016-02-03 NOTE — ED Notes (Signed)
Per [facility]-Pt comes from [facility] where staff found her unresponsive with agonal breathing. Staff gave pt neb treatment, placed on 2L and [facility].

## 2016-02-03 NOTE — Progress Notes (Signed)
Brief Nutrition Note  Consult received for enteral/tube feeding initiation and management.  Adult Enteral Nutrition Protocol initiated. Full assessment to follow.  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  Labs:   Recent Labs Lab 02/11/2016 0835  NA 135  K 5.3*  CL 89*  CO2 36*  BUN 39*  CREATININE 1.62*  CALCIUM 8.7*  GLUCOSE 232*    [address] Belmont RoadCate Peyton Spengler MS, RD, LDN [phone] Pager  [phone] Weekend/On-Call Pager

## 2016-02-03 NOTE — ED Notes (Signed)
Movement to right side noted. Left side flaccid.

## 2016-02-03 NOTE — H&P (Signed)
PULMONARY / CRITICAL CARE MEDICINE   Name: Catherine Holmes MRN: [id number] DOB: [birth year]    ADMISSION DATE:    PT PROFILE:   16 F with COPD, CHF recently hospitalized @ [facility] 03/21 - 03/30 for acute on chronic respiratory failure. She was transferred to [facility], then SNF for rehab. On the morning of this admission, she was found by SNF personnel agonal and unresponsive and transported to ED where she was intubated. CT head reveals massive R MCA territory CVA with cytotoxic edema  MAJOR EVENTS/TEST RESULTS: 04/14 Admission as above 04/14 CT head: large R MCA territory CVA with cytotoxic edema  INDWELLING DEVICES:: ETT 04/14 >>   MICRO DATA: Urine 04/14 >>  Resp 04/14  >>  Blood 04/14 >>   ANTIMICROBIALS:  Vanc 04/14 >>  Cefepime 04/14 >>   HISTORY OF PRESENT ILLNESS:   Level 5 caveat  PAST MEDICAL HISTORY :  She  has a past medical history of Essential hypertension; Diabetes mellitus type II, controlled (HCC); COPD (chronic obstructive pulmonary disease) (HCC); Osteoporosis; Carotid arterial disease (HCC); Tic douloureux; Arthralgia; Sjoegren syndrome (HCC); TIA (transient ischemic attack); Emphysema; MVP (mitral valve prolapse); Chronic diastolic CHF (congestive heart failure) (HCC); Acid reflux; Diverticulitis; Anemia; History of pneumonia; Syncope and collapse; Anemia; Hip fracture, left (HCC); Hyperlipidemia; Hiatal hernia; Chest pain; Noncompliance; and PAF (paroxysmal atrial fibrillation) (HCC).  PAST SURGICAL HISTORY: She  has past surgical history that includes Cholecystectomy (1965); Abdominal hysterectomy (1964); Cataract extraction (Bilateral); and Hip fracture surgery (2014).  Allergies  Allergen Reactions  . Singlet [Chlorphen-Pe-Acetaminophen] Shortness Of Breath  . Atorvastatin Other (See Comments)    Reaction:  Unknown   . Clindamycin Diarrhea and Nausea And Vomiting  . Codeine Other (See Comments)    Reaction:  Unknown   . Doxycycline Hives and  Itching  . Epinephrine Hives and Itching  . Erythromycin Hives and Itching  . Fluticasone-Salmeterol Other (See Comments)    Reaction:  Unknown   . Lasix [Furosemide] Itching  . Levofloxacin Other (See Comments)    Reaction:  Unknown   . Penicillins Other (See Comments)    Reaction:  Unknown   . Propoxyphene Hcl Other (See Comments)    Reaction:  Unknown   . Rosuvastatin Other (See Comments)    Reaction:  Unknown   . Simvastatin Other (See Comments)    Reaction:  Unknown   . Sulfamethoxazole-Trimethoprim Other (See Comments)    Reaction:  Unknown   . Teriparatide Other (See Comments)    Reaction:  Unknown   . Tetracyclines & Related Hives and Itching  . Ceclor [Cefaclor] Hives and Other (See Comments)    Reaction:  Altered mental status    . Glipizide Hives and Itching  . Lisinopril Hives and Itching    Current Facility-Administered Medications on File Prior to Encounter  Medication  . 0.9 %  sodium chloride infusion  . alteplase (CATHFLO ACTIVASE) injection 2 mg  . ferumoxytol (FERAHEME) 510 mg in sodium chloride 0.9 % 100 mL IVPB  . heparin lock flush 100 unit/mL  . heparin lock flush 100 unit/mL  . sodium chloride 0.9 % injection 10 mL  . sodium chloride 0.9 % injection 3 mL   Current Outpatient Prescriptions on File Prior to Encounter  Medication Sig  . albuterol (PROVENTIL) (2.5 MG/3ML) 0.083% nebulizer solution Take 2.5 mg by nebulization every 6 (six) hours as needed for wheezing or shortness of breath.   . ALPRAZolam (XANAX) 0.25 MG tablet Take 1 tablet (0.25 mg total)  by mouth 3 (three) times daily as needed for anxiety.  Marland Kitchen aspirin EC 81 MG tablet Take 81 mg by mouth daily with lunch.   . budesonide (PULMICORT) 0.25 MG/2ML nebulizer solution Take 2 mLs (0.25 mg total) by nebulization 2 (two) times daily.  . bumetanide (BUMEX) 1 MG tablet Take 1 tablet (1 mg total) by mouth 2 (two) times daily.  . calcium carbonate (OSCAL) 1500 (600 Ca) MG TABS tablet Take 600 mg  of elemental calcium by mouth daily with breakfast.  . chlorhexidine (PERIDEX) 0.12 % solution 15 mLs by Mouth Rinse route at bedtime.   . citalopram (CELEXA) 20 MG tablet Take 20 mg by mouth daily with lunch.   . diphenhydramine-acetaminophen (TYLENOL PM) 25-500 MG TABS tablet Take 1 tablet by mouth at bedtime as needed (for sleep).  Marland Kitchen doxazosin (CARDURA) 4 MG tablet Take 4 mg by mouth at bedtime.   Marland Kitchen ezetimibe (ZETIA) 10 MG tablet Take 10 mg by mouth at bedtime.   . ferrous sulfate 325 (65 FE) MG tablet Take 1 tablet (325 mg total) by mouth 2 (two) times daily with a meal.  . fluticasone (FLONASE) 50 MCG/ACT nasal spray Place 1 spray into both nostrils 2 (two) times daily.   Marland Kitchen gabapentin (NEURONTIN) 100 MG capsule Take 100 mg by mouth 2 (two) times daily.   Marland Kitchen guaiFENesin-dextromethorphan (ROBITUSSIN DM) 100-10 MG/5ML syrup Take 5 mLs by mouth every 4 (four) hours as needed for cough.  . hydrALAZINE (APRESOLINE) 25 MG tablet Take 1 tablet (25 mg total) by mouth every 8 (eight) hours.  Marland Kitchen HYDROcodone-acetaminophen (NORCO/VICODIN) 5-325 MG tablet Take 1 tablet by mouth every 4 (four) hours as needed for moderate pain.  . mometasone-formoterol (DULERA) 100-5 MCG/ACT AERO Inhale 2 puffs into the lungs 2 (two) times daily.  . mupirocin ointment (BACTROBAN) 2 % Apply 1 application topically daily as needed (for sores on leg).   . nebivolol (BYSTOLIC) 5 MG tablet Take 1 tablet (5 mg total) by mouth daily.  . nitroGLYCERIN (NITROSTAT) 0.4 MG SL tablet Place 0.4 mg under the tongue every 5 (five) minutes as needed for chest pain.  Bertram Gala Glycol-Propyl Glycol (SYSTANE OP) Place 1 drop into both eyes 2 (two) times daily.  . polyvinyl alcohol (LIQUIFILM TEARS) 1.4 % ophthalmic solution Place 1 drop into both eyes 2 (two) times daily.  . promethazine (PHENERGAN) 25 MG tablet Take 25 mg by mouth every 8 (eight) hours as needed for nausea or vomiting.   Marland Kitchen SPIRIVA RESPIMAT 1.25 MCG/ACT AERS Inhale 1 puff  into the lungs daily.  Marland Kitchen spironolactone (ALDACTONE) 25 MG tablet Take 25 mg by mouth every Monday, Wednesday, and Friday.  . tiotropium (SPIRIVA) 18 MCG inhalation capsule Place 1 capsule (18 mcg total) into inhaler and inhale daily.  . feeding supplement, ENSURE ENLIVE, (ENSURE ENLIVE) LIQD Take 237 mLs by mouth 2 (two) times daily between meals.    FAMILY HISTORY:  Her indicated that her mother is deceased. She indicated that her father is deceased. She indicated that her sister is deceased.   SOCIAL HISTORY: She  reports that she quit smoking about 21 years ago. Her smoking use included Cigarettes. She has a 15 pack-year smoking history. She has never used smokeless tobacco. She reports that she drinks alcohol. She reports that she does not use illicit drugs.  REVIEW OF SYSTEMS:   Level 5 caveat  SUBJECTIVE:    VITAL SIGNS: BP 118/68 mmHg  Pulse 61  Temp(Src) 100 F (37.8 C)  Resp 16  Ht 5\' 4"  (1.626 m)  Wt 72.4 kg (159 lb 9.8 oz)  BMI 27.38 kg/m2  SpO2 100%  HEMODYNAMICS:    VENTILATOR SETTINGS: Vent Mode:  [-] PRVC FiO2 (%):  [50 %-90 %] 50 % Set Rate:  [16 bmp-22 bmp] 16 bmp Vt Set:  [450 mL-500 mL] 500 mL PEEP:  [5 cmH20] 5 cmH20  INTAKE / OUTPUT:    PHYSICAL EXAMINATION: General: Elderly, chronically ill appearing, RASS -5, intubated Neuro: No spontaneous movement, PERRL, no withdrawal, decreased tone on L HEENT: NCAT Cardiovascular: regular, no M noted Lungs: decreased BS, prolonged expiratory time, no wheezes Abdomen: soft, NT, +BS Ext: multiple ecchymoses, no edema, warm  LABS:  BMET  Recent Labs Lab 02/11/2016 0835  NA 135  K 5.3*  CL 89*  CO2 36*  BUN 39*  CREATININE 1.62*  GLUCOSE 232*    Electrolytes  Recent Labs Lab 02/16/2016 0835  CALCIUM 8.7*    CBC  Recent Labs Lab  0835  WBC 20.0*  HGB 10.5*  HCT 31.7*  PLT 339    Coag's  Recent Labs Lab 01/31/2016 0835  INR 1.28    Sepsis Markers  Recent Labs Lab  01/30/2016 0835  LATICACIDVEN 1.9    ABG  Recent Labs Lab 02/14/2016 0930 01/28/2016 1250  PHART 7.33* 7.41  PCO2ART 68* 52*  PO2ART 53* 122*    Liver Enzymes  Recent Labs Lab 02/19/2016 0835  AST 223*  ALT 187*  ALKPHOS 101  BILITOT 0.8  ALBUMIN 3.6    Cardiac Enzymes  Recent Labs Lab 01/22/2016 0835  TROPONINI 0.46*    Glucose  Recent Labs Lab 02/05/2016 0839 02/13/2016 1226 01/27/2016 1422  GLUCAP 200* 241* 263*    CXR:  Vague R basilar opacity   ASSESSMENT / PLAN:  PULMONARY A: Acute on chronic respiratory failure COPD - by clinical history and exam, appears to be severe Suspect RLL PNA P:   Vent settings established Vent bundle implemented Daily SBT as indicated Systemic steroids Nebulized steroids and bronchodilators Empiric antibiotics  CARDIOVASCULAR A:  H/O diastolic CHF H/O PAF MVP P:  Monitor rhythm and hemodynamics MAP goal > 65 mmHg  RENAL A:   AKI Mild hyperkalemia Not currently a candidate for HD P:   Monitor BMET intermittently Monitor I/Os Correct electrolytes as indicated Maintenance IVFs until TFs @ goal  GASTROINTESTINAL A:   No issues P:   SUP: IV PPI TF protocol  HEMATOLOGIC A:   Mild chronic anemia without acute blood loss P:  DVT px: SQ heparin Monitor CBC intermittently Transfuse per usual guidelines  INFECTIOUS A:   Suspected RLL PNA P:   Monitor temp, WBC count Micro and abx as above  ENDOCRINE A:   Chronic prednisone Risk of steroid induced hyperglycemia P:   Hydrocortisone 50 mg IV q 12 hrs SSI - mod scale   NEUROLOGIC A:   Massive R MCA territory acute CVA Chronic debilitation P:   RASS goal: -1, -2 PAD protocol Neurology consultation requested 04/14   FAMILY  - Updates: Daughter - I explained that pt has massive CVA and in the setting of advanced age and chronic debilitation, the prognosis for functional recovery is vanishingly small. I discussed the concept of limiting our  interventions and strongly urged that pt be made DNR now. She was understandably distraught and emotionally not able to address these issues at this time.   CCM time: 45 mins The above time includes time spent in consultation with patient  and/or family members and reviewing care plan with nursing staff  Billy Fischeravid Winifred Balogh, MD PCCM service Mobile [phone] Pager [phone]     Oct 24, 2015, 2:41 PM

## 2016-02-03 NOTE — ED Notes (Signed)
Pt dentures placed in pt belonging bag at bedside.

## 2016-02-03 NOTE — ED Notes (Signed)
MD at bedside. 

## 2016-02-04 ENCOUNTER — Inpatient Hospital Stay: Payer: Medicare Other

## 2016-02-04 DIAGNOSIS — I63511 Cerebral infarction due to unspecified occlusion or stenosis of right middle cerebral artery: Principal | ICD-10-CM

## 2016-02-04 LAB — BASIC METABOLIC PANEL
Anion gap: 7 (ref 5–15)
BUN: 40 mg/dL — ABNORMAL HIGH (ref 6–20)
CALCIUM: 7.9 mg/dL — AB (ref 8.9–10.3)
CO2: 33 mmol/L — AB (ref 22–32)
CREATININE: 0.91 mg/dL (ref 0.44–1.00)
Chloride: 93 mmol/L — ABNORMAL LOW (ref 101–111)
GFR calc non Af Amer: 56 mL/min — ABNORMAL LOW (ref >60)
Glucose, Bld: 238 mg/dL — ABNORMAL HIGH (ref 65–99)
Potassium: 3.6 mmol/L (ref 3.5–5.1)
SODIUM: 133 mmol/L — AB (ref 135–145)

## 2016-02-04 LAB — CBC
HCT: 26.1 % — ABNORMAL LOW (ref 35.0–47.0)
Hemoglobin: 8.3 g/dL — ABNORMAL LOW (ref 12.0–16.0)
MCH: 26.9 pg (ref 26.0–34.0)
MCHC: 31.9 g/dL — AB (ref 32.0–36.0)
MCV: 84.4 fL (ref 80.0–100.0)
Platelets: 270 10*3/uL (ref 150–440)
RBC: 3.09 MIL/uL — ABNORMAL LOW (ref 3.80–5.20)
RDW: 23.7 % — AB (ref 11.5–14.5)
WBC: 17.8 10*3/uL — ABNORMAL HIGH (ref 3.6–11.0)

## 2016-02-04 LAB — EXPECTORATED SPUTUM ASSESSMENT W GRAM STAIN, RFLX TO RESP C

## 2016-02-04 LAB — GLUCOSE, CAPILLARY
GLUCOSE-CAPILLARY: 173 mg/dL — AB (ref 65–99)
GLUCOSE-CAPILLARY: 210 mg/dL — AB (ref 65–99)
GLUCOSE-CAPILLARY: 217 mg/dL — AB (ref 65–99)
GLUCOSE-CAPILLARY: 234 mg/dL — AB (ref 65–99)
GLUCOSE-CAPILLARY: 260 mg/dL — AB (ref 65–99)
Glucose-Capillary: 226 mg/dL — ABNORMAL HIGH (ref 65–99)

## 2016-02-04 LAB — BLOOD CULTURE ID PANEL (REFLEXED)
Acinetobacter baumannii: NOT DETECTED
CANDIDA GLABRATA: NOT DETECTED
CANDIDA KRUSEI: NOT DETECTED
CARBAPENEM RESISTANCE: NOT DETECTED
Candida albicans: NOT DETECTED
Candida parapsilosis: NOT DETECTED
Candida tropicalis: NOT DETECTED
Enterobacter cloacae complex: NOT DETECTED
Enterobacteriaceae species: NOT DETECTED
Enterococcus species: NOT DETECTED
Escherichia coli: NOT DETECTED
HAEMOPHILUS INFLUENZAE: NOT DETECTED
KLEBSIELLA OXYTOCA: NOT DETECTED
Klebsiella pneumoniae: NOT DETECTED
Listeria monocytogenes: NOT DETECTED
METHICILLIN RESISTANCE: DETECTED — AB
NEISSERIA MENINGITIDIS: NOT DETECTED
PSEUDOMONAS AERUGINOSA: NOT DETECTED
Proteus species: NOT DETECTED
STAPHYLOCOCCUS SPECIES: DETECTED — AB
STREPTOCOCCUS AGALACTIAE: NOT DETECTED
STREPTOCOCCUS SPECIES: NOT DETECTED
Serratia marcescens: NOT DETECTED
Staphylococcus aureus (BCID): NOT DETECTED
Streptococcus pneumoniae: NOT DETECTED
Streptococcus pyogenes: NOT DETECTED
Vancomycin resistance: NOT DETECTED

## 2016-02-04 LAB — PHOSPHORUS: PHOSPHORUS: 1.8 mg/dL — AB (ref 2.5–4.6)

## 2016-02-04 LAB — OSMOLALITY
OSMOLALITY: 298 mosm/kg — AB (ref 275–295)
OSMOLALITY: 300 mosm/kg — AB (ref 275–295)

## 2016-02-04 LAB — MAGNESIUM: MAGNESIUM: 1.6 mg/dL — AB (ref 1.7–2.4)

## 2016-02-04 LAB — EXPECTORATED SPUTUM ASSESSMENT W REFEX TO RESP CULTURE

## 2016-02-04 LAB — PROCALCITONIN: PROCALCITONIN: 0.85 ng/mL

## 2016-02-04 MED ORDER — HYDROCORTISONE NA SUCCINATE PF 100 MG IJ SOLR
25.0000 mg | Freq: Two times a day (BID) | INTRAMUSCULAR | Status: DC
  Administered 2016-02-04 – 2016-02-06 (×4): 25 mg via INTRAVENOUS
  Filled 2016-02-04 (×4): qty 2

## 2016-02-04 MED ORDER — VITAL HIGH PROTEIN PO LIQD
1000.0000 mL | ORAL | Status: DC
  Administered 2016-02-04: 10:00:00
  Administered 2016-02-04 – 2016-02-05 (×2): 1000 mL
  Administered 2016-02-05
  Administered 2016-02-06: 1000 mL

## 2016-02-04 MED ORDER — AMIODARONE HCL 200 MG PO TABS
200.0000 mg | ORAL_TABLET | Freq: Two times a day (BID) | ORAL | Status: DC
  Administered 2016-02-04 – 2016-02-06 (×5): 200 mg via ORAL
  Filled 2016-02-04 (×5): qty 1

## 2016-02-04 MED ORDER — FENTANYL CITRATE (PF) 100 MCG/2ML IJ SOLN: 100.0000 ug | INTRAMUSCULAR | Status: DC | PRN

## 2016-02-04 MED ORDER — FENTANYL CITRATE (PF) 100 MCG/2ML IJ SOLN
100.0000 ug | INTRAMUSCULAR | Status: DC | PRN
  Administered 2016-02-06: 100 ug via INTRAVENOUS
  Filled 2016-02-04: qty 2

## 2016-02-04 MED ORDER — MANNITOL 25 % IV SOLN
25.0000 g/h | Freq: Four times a day (QID) | INTRAVENOUS | Status: DC
  Administered 2016-02-04 (×3): 25 g/h via INTRAVENOUS
  Filled 2016-02-04 (×8): qty 100

## 2016-02-04 NOTE — Progress Notes (Signed)
Nutrition Follow-up      INTERVENTION:  -Recommend Vital High Protein at goal rate of 7355ml/hr to meet 100% of kcals and protein needs.  Will provide 1320 kcals, 116 g of protrein and 1108ml free water.    NUTRITION DIAGNOSIS:   Inadequate oral intake related to acute illness as evidenced by NPO status.    GOAL:   Provide needs based on ASPEN/SCCM guidelines    MONITOR:   Labs, TF tolerance, Vent status  REASON FOR ASSESSMENT:   Consult Enteral/tube feeding initiation and management  ASSESSMENT:   82 y/o female admitted with massive CVA, acute respiratory failure on vent, with possible pneumonia.  Past Medical History  Diagnosis Date  . Essential hypertension   . Diabetes mellitus type II, controlled (HCC)   . COPD (chronic obstructive pulmonary disease) (HCC)   . Osteoporosis   . Carotid arterial disease (HCC)     a. 2012 Carotid dopplers: 40-59% R ICA, 60-79% L ICA  . Tic douloureux   . Arthralgia   . Sjoegren syndrome (HCC)   . TIA (transient ischemic attack)   . Emphysema   . MVP (mitral valve prolapse)   . Chronic diastolic CHF (congestive heart failure) (HCC)     a. 08/2011 Echo: EF 65-70%, mild LVH, mod dil LA, mildly dil RA, mild MR, mild-mod AoV Sclerosis w/o stenosis, mod-sev elev PA pressures, mild TR.  Marland Kitchen. Acid reflux   . Diverticulitis   . Anemia   . History of pneumonia   . Syncope and collapse   . Anemia   . Hip fracture, left (HCC)   . Hyperlipidemia   . Hiatal hernia   . Chest pain     a. 08/2011 MV: EF 74%, no ischemia.  . Noncompliance     a. h/o not taking bumex as Rx.  Marland Kitchen. PAF (paroxysmal atrial fibrillation) (HCC)     a. CHA2DS2VASc = 8 ->No OAC 2/2 falls.   Medications reviewed aspart, protonix, dulcolax prn, colace prn, LR at 1850ml/hr added this am  Labs reveiwed Na 133, BUN 40, creatinine WDL, Calcium 7.9, glucose 161238, phosphorus 1.8, Mag 1.6 (electrolyte protocol in place per orders)  Vital High Protein infusing via OG tube at  6940ml/hr with free water flush of 100ml q 8 hr   Skin:   (stage 1 pressure ulcer noted)  Last BM:  4/13  Height:   Ht Readings from Last 1 Encounters:  01/31/2016 5\' 1"  (1.549 m)    Weight:   Wt Readings from Last 1 Encounters:  02/04/16 159 lb 9.8 oz (72.4 kg)    Ideal Body Weight:     BMI:  Body mass index is 30.17 kg/(m^2).  Estimated Nutritional Needs:   Kcal:  1308 kcals/d (Ve 7, Tmax 37.4)  Protein:  86-144 g/d  Fluid:  >2 L/d  EDUCATION NEEDS:   No education needs identified at this time  Morganna Styles B. Freida BusmanAllen, RD, LDN [phone] (pager) Weekend/On-Call pager [phone])

## 2016-02-04 NOTE — H&P (Signed)
CC: R MCA stroke   HPI: Catherine Holmes is an 82 y.o. female with COPD, CHF recently hospitalized @ [facility] 03/21 - 03/30 for acute on chronic respiratory failure. She was transferred to [facility], then SNF for rehab. On the morning of this admission, she was found by SNF personnel agonal and unresponsive and transported to ED where she was intubated. CT head reveals massive R MCA territory CVA with cytotoxic edema No intervention due to completed stroke taking out her whole R MCA.  Past Medical History  Diagnosis Date  . Essential hypertension   . Diabetes mellitus type II, controlled (HCC)   . COPD (chronic obstructive pulmonary disease) (HCC)   . Osteoporosis   . Carotid arterial disease (HCC)     a. 2012 Carotid dopplers: 40-59% R ICA, 60-79% L ICA  . Tic douloureux   . Arthralgia   . Sjoegren syndrome (HCC)   . TIA (transient ischemic attack)   . Emphysema   . MVP (mitral valve prolapse)   . Chronic diastolic CHF (congestive heart failure) (HCC)     a. 08/2011 Echo: EF 65-70%, mild LVH, mod dil LA, mildly dil RA, mild MR, mild-mod AoV Sclerosis w/o stenosis, mod-sev elev PA pressures, mild TR.  Marland Kitchen Acid reflux   . Diverticulitis   . Anemia   . History of pneumonia   . Syncope and collapse   . Anemia   . Hip fracture, left (HCC)   . Hyperlipidemia   . Hiatal hernia   . Chest pain     a. 08/2011 MV: EF 74%, no ischemia.  . Noncompliance     a. h/o not taking bumex as Rx.  Marland Kitchen PAF (paroxysmal atrial fibrillation) (HCC)     a. CHA2DS2VASc = 8 ->No OAC 2/2 falls.    Past Surgical History  Procedure Laterality Date  . Cholecystectomy  1965  . Abdominal hysterectomy  1964  . Cataract extraction Bilateral   . Hip fracture surgery  2014    left hip    Family History  Problem Relation Age of Onset  . Heart attack Mother   . Heart attack Father   . Heart attack Sister     Social History:  reports that she quit smoking about 21 years ago. Her smoking use included Cigarettes. She  has a 15 pack-year smoking history. She has never used smokeless tobacco. She reports that she drinks alcohol. She reports that she does not use illicit drugs.  Allergies  Allergen Reactions  . Singlet [Chlorphen-Pe-Acetaminophen] Shortness Of Breath  . Atorvastatin Other (See Comments)    Reaction:  Unknown   . Clindamycin Diarrhea and Nausea And Vomiting  . Codeine Other (See Comments)    Reaction:  Unknown   . Doxycycline Hives and Itching  . Epinephrine Hives and Itching  . Erythromycin Hives and Itching  . Fluticasone-Salmeterol Other (See Comments)    Reaction:  Unknown   . Lasix [Furosemide] Itching  . Levofloxacin Other (See Comments)    Reaction:  Unknown   . Penicillins Other (See Comments)    Reaction:  Unknown   . Propoxyphene Hcl Other (See Comments)    Reaction:  Unknown   . Rosuvastatin Other (See Comments)    Reaction:  Unknown   . Simvastatin Other (See Comments)    Reaction:  Unknown   . Sulfamethoxazole-Trimethoprim Other (See Comments)    Reaction:  Unknown   . Teriparatide Other (See Comments)    Reaction:  Unknown   . Tetracyclines &  Related Hives and Itching  . Ceclor [Cefaclor] Hives and Other (See Comments)    Reaction:  Altered mental status    . Glipizide Hives and Itching  . Lisinopril Hives and Itching    Medications: I have reviewed the patient's current medications.  ROS: Unable to obtain due to intubation, not following commands.   Physical Examination: Blood pressure 109/48, pulse 87, temperature 99.7 F (37.6 C), resp. rate 16, height  (1.549 m), weight 159 lb 9.8 oz (72.4 kg), SpO2 93 %.  Overbreathing the vent.  EOM not able to access Pupils sluggish but reatctive Withdraws from pain  RUE and RLE Occasionally squeezing LUE, could be spontaneous/not purposeful.  Tripple flexion LUE    Laboratory Studies:   Basic Metabolic Panel:  Recent Labs Lab 2016-02-27 0835 02/04/16 0517  NA 135 133*  K 5.3* 3.6  CL 89* 93*   CO2 36* 33*  GLUCOSE 232* 238*  BUN 39* 40*  CREATININE 1.62* 0.91  CALCIUM 8.7* 7.9*  MG  --  1.6*  PHOS  --  1.8*    Liver Function Tests:  Recent Labs Lab Feb 27, 2016 0835  AST 223*  ALT 187*  ALKPHOS 101  BILITOT 0.8  PROT 6.7  ALBUMIN 3.6    Recent Labs Lab 02/27/16 0835  LIPASE <10*   No results for input(s): AMMONIA in the last 168 hours.  CBC:  Recent Labs Lab 02/27/2016 0835 02/04/16 0517  WBC 20.0* 17.8*  NEUTROABS 17.9*  --   HGB 10.5* 8.3*  HCT 31.7* 26.1*  MCV 84.5 84.4  PLT 339 270    Cardiac Enzymes:  Recent Labs Lab Feb 27, 2016 0835 02/27/2016 1643 2016-02-27 2238  TROPONINI 0.46* 0.41* 0.40*    BNP: Invalid input(s): POCBNP  CBG:  Recent Labs Lab 2016-02-27 1651 02-27-2016 1932 Feb 27, 2016 2335 02/04/16 0453 02/04/16 0739  GLUCAP 257* 316* 324* 210* 217*    Microbiology: Results for orders placed or performed during the hospital encounter of 02-27-2016  Blood Culture (routine x 2)     Status: None (Preliminary result)   Collection Time: 27-Feb-2016  8:30 AM  Result Value Ref Range Status   Specimen Description BLOOD LEFT ARM  Final   Special Requests   Final    BOTTLES DRAWN AEROBIC AND ANAEROBIC  AER 3CC ANA 6CC   Culture  Setup Time   Final    GRAM POSITIVE COCCI AEROBIC BOTTLE ONLY CRITICAL RESULT CALLED TO, READ BACK BY AND VERIFIED WITH: Malena Peer @ 0820 02/04/16 by [facility]    Culture PENDING  Incomplete   Report Status PENDING  Incomplete  Blood Culture ID Panel (Reflexed)     Status: Abnormal   Collection Time: Feb 27, 2016  8:30 AM  Result Value Ref Range Status   Enterococcus species NOT DETECTED NOT DETECTED Final   Vancomycin resistance NOT DETECTED NOT DETECTED Final   Listeria monocytogenes NOT DETECTED NOT DETECTED Final   Staphylococcus species DETECTED (A) NOT DETECTED Final    Comment: CRITICAL RESULT CALLED TO, READ BACK BY AND VERIFIED WITH: Malena Peer @ 0820 02/04/16 [facility]    Staphylococcus aureus NOT DETECTED  NOT DETECTED Final   Methicillin resistance DETECTED (A) NOT DETECTED Final    Comment: CRITICAL RESULT CALLED TO, READ BACK BY AND VERIFIED WITH: Malena Peer @ 0820 02/04/16 [facility]    Streptococcus species NOT DETECTED NOT DETECTED Final   Streptococcus agalactiae NOT DETECTED NOT DETECTED Final   Streptococcus pneumoniae NOT DETECTED NOT DETECTED Final   Streptococcus pyogenes NOT  DETECTED NOT DETECTED Final   Acinetobacter baumannii NOT DETECTED NOT DETECTED Final   Enterobacteriaceae species NOT DETECTED NOT DETECTED Final   Enterobacter cloacae complex NOT DETECTED NOT DETECTED Final   Escherichia coli NOT DETECTED NOT DETECTED Final   Klebsiella oxytoca NOT DETECTED NOT DETECTED Final   Klebsiella pneumoniae NOT DETECTED NOT DETECTED Final   Proteus species NOT DETECTED NOT DETECTED Final   Serratia marcescens NOT DETECTED NOT DETECTED Final   Carbapenem resistance NOT DETECTED NOT DETECTED Final   Haemophilus influenzae NOT DETECTED NOT DETECTED Final   Neisseria meningitidis NOT DETECTED NOT DETECTED Final   Pseudomonas aeruginosa NOT DETECTED NOT DETECTED Final   Candida albicans NOT DETECTED NOT DETECTED Final   Candida glabrata NOT DETECTED NOT DETECTED Final   Candida krusei NOT DETECTED NOT DETECTED Final   Candida parapsilosis NOT DETECTED NOT DETECTED Final   Candida tropicalis NOT DETECTED NOT DETECTED Final  Urine culture     Status: Abnormal (Preliminary result)   Collection Time: 2016/04/01  8:35 AM  Result Value Ref Range Status   Specimen Description URINE, RANDOM  Final   Special Requests NONE  Final   Culture (A)  Final    >=100,000 COLONIES/mL GRAM NEGATIVE RODS IDENTIFICATION AND SUSCEPTIBILITIES TO FOLLOW    Report Status PENDING  Incomplete  MRSA PCR Screening     Status: None   Collection Time: 2016/04/01  2:24 PM  Result Value Ref Range Status   MRSA by PCR NEGATIVE NEGATIVE Final    Comment:        The GeneXpert MRSA Assay (FDA approved for NASAL  specimens only), is one component of a comprehensive MRSA colonization surveillance program. It is not intended to diagnose MRSA infection nor to guide or monitor treatment for MRSA infections.     Coagulation Studies:  Recent Labs  2016/04/01 0835  LABPROT 16.1*  INR 1.28    Urinalysis: No results for input(s): COLORURINE, LABSPEC, PHURINE, GLUCOSEU, HGBUR, BILIRUBINUR, KETONESUR, PROTEINUR, UROBILINOGEN, NITRITE, LEUKOCYTESUR in the last 168 hours.  Invalid input(s): APPERANCEUR  Lipid Panel:  No results found for: CHOL, TRIG, HDL, CHOLHDL, VLDL, LDLCALC  HgbA1C:  Lab Results  Component Value Date   HGBA1C 6.2* 01/11/2016    Urine Drug Screen:  No results found for: LABOPIA, COCAINSCRNUR, LABBENZ, AMPHETMU, THCU, LABBARB  Alcohol Level: No results for input(s): ETH in the last 168 hours.  Other results: EKG: normal EKG, normal sinus rhythm, unchanged from previous tracings.  Imaging: Ct Head Wo Contrast  2016/05/08  CLINICAL DATA:  Acute change in mental status.  Hypoxia EXAM: CT HEAD WITHOUT CONTRAST TECHNIQUE: Contiguous axial images were obtained from the base of the skull through the vertex without intravenous contrast. COMPARISON:  07/09/2013 FINDINGS: Skull and Sinuses:Negative for fracture or destructive process. The visualized mastoids, middle ears, and imaged paranasal sinuses are clear. Endotracheal and enteric intubation Visualized orbits: Negative. Brain: Cytotoxic pattern edema throughout the entire right MCA territory. There is effacement of the right lateral ventricle without herniation or significant midline shift. No hemorrhage or hydrocephalus. No other site of acute infarct detected. Chronic appearing lacunar infarct in the right thalamus and left caudate head. Critical Value/emergent results were called by telephone at the time of interpretation on 2016/05/08 at 10:59 am to Dr. Sharyn CreamerMARK QUALE , who verbally acknowledged these results. IMPRESSION: Acute  nonhemorrhagic infarction of the entire right MCA territory. Electronically Signed   By: Marnee SpringJonathon  Watts M.D.   On: 02017/07/18 11:00   Dg Chest Port 1  View  02/04/2016  CLINICAL DATA:  Respiratory failure. EXAM: PORTABLE CHEST 1 VIEW COMPARISON:  Yesterday. FINDINGS: Endotracheal tube tip 1 cm above the carina. Nasogastric tube extending into the stomach. Decreased inspiration with crowding of the lung markings. Stable enlarged cardiac silhouette and prominent pulmonary vasculature and interstitial markings. Diffuse osteopenia. IMPRESSION: Stable cardiomegaly, pulmonary vascular congestion and chronic interstitial lung disease with possible mild superimposed interstitial pulmonary edema. Electronically Signed   By: Beckie Salts M.D.   On: 02/04/2016 07:32   Dg Chest Port 1 View  02/01/2016  CLINICAL DATA:  Sepsis.  Unresponsive. EXAM: PORTABLE CHEST 1 VIEW COMPARISON:  January 15, 2016. FINDINGS: Stable cardiomegaly. No pneumothorax or significant pleural effusion is noted. Endotracheal tube is in grossly good position with distal tip 4 cm above the carina. Left lung is clear. Mild right basilar opacity is noted concerning for atelectasis or possibly pneumonia. Bony thorax is unremarkable. IMPRESSION: Endotracheal tube in grossly good position. Mild right basilar opacity concerning for atelectasis or possibly pneumonia. Electronically Signed   By: Lupita Raider, M.D.   On: 02/08/2016 09:02     Assessment/Plan:  @ 82 y/o F. With RMCA stroke likely in setting of R M1 occlusion.   - Poor prognosis for meaningful recovery - Likely will herniate and stroke out her RMCA followed by L MCA due to edema - mannitol 25 q6 started with goal OSM <320. Osm q6.  - No central Line. Holding off 3%, but will not change outcome - discussed with daughter.  Code status discussed. She is unable to make decision now -not candidate for hemicraniectomy.   - s/p discussion with daughter and nursing at bedside -  will repeat [facility] tomorrow.    02/04/2016, 11:14 AM

## 2016-02-04 NOTE — Progress Notes (Signed)
0800 attempted to wean patients ventilator setting. Not tolerated. Parient become agitated and diaphoretic. Dr. Ardyth Manam informed. Previous setting resumed.

## 2016-02-04 NOTE — Progress Notes (Signed)
1010 Discussed code status with daughter POA. Daughter wants Ms. Chill to be a full code at this time.

## 2016-02-04 NOTE — Progress Notes (Signed)
Pharmacy Antibiotic Note  Catherine Holmes is a 82 y.o. female admitted on 05-13-16 with pneumonia.  Patient currently on cefepime and vancomycin. BCID results GCP, staph species Mec A positive in aerobic bottle.    Plan: Message sent to Dr. Sung AmabileSimonds. Most likely contaminant. Patient however is already on vancomycin, therefore no changes in therapy are needed.  Height: 5\' 1"  (154.9 cm) Weight: 159 lb 9.8 oz (72.4 kg) IBW/kg (Calculated) : 47.8  Temp (24hrs), Avg:99.3 F (37.4 C), Min:98.5 F (36.9 C), Max:100.2 F (37.9 C)   Recent Labs Lab 05/18/16 0835 02/04/16 0517  WBC 20.0* 17.8*  CREATININE 1.62* 0.91  LATICACIDVEN 1.9  --     Estimated Creatinine Clearance: 41.1 mL/min (by C-G formula based on Cr of 0.91).    Allergies  Allergen Reactions  . Singlet [Chlorphen-Pe-Acetaminophen] Shortness Of Breath  . Atorvastatin Other (See Comments)    Reaction:  Unknown   . Clindamycin Diarrhea and Nausea And Vomiting  . Codeine Other (See Comments)    Reaction:  Unknown   . Doxycycline Hives and Itching  . Epinephrine Hives and Itching  . Erythromycin Hives and Itching  . Fluticasone-Salmeterol Other (See Comments)    Reaction:  Unknown   . Lasix [Furosemide] Itching  . Levofloxacin Other (See Comments)    Reaction:  Unknown   . Penicillins Other (See Comments)    Reaction:  Unknown   . Propoxyphene Hcl Other (See Comments)    Reaction:  Unknown   . Rosuvastatin Other (See Comments)    Reaction:  Unknown   . Simvastatin Other (See Comments)    Reaction:  Unknown   . Sulfamethoxazole-Trimethoprim Other (See Comments)    Reaction:  Unknown   . Teriparatide Other (See Comments)    Reaction:  Unknown   . Tetracyclines & Related Hives and Itching  . Ceclor [Cefaclor] Hives and Other (See Comments)    Reaction:  Altered mental status    . Glipizide Hives and Itching  . Lisinopril Hives and Itching    Antimicrobials this admission: meropenem 4/14 >> 4/14 vancomycin  4/14 >>  Cefepime 4/14 >>  Dose adjustments this admission:   Microbiology results:  Recent Results (from the past 240 hour(s))  Blood Culture (routine x 2)     Status: None (Preliminary result)   Collection Time: 05/18/16  8:30 AM  Result Value Ref Range Status   Specimen Description BLOOD LEFT ARM  Final   Special Requests   Final    BOTTLES DRAWN AEROBIC AND ANAEROBIC  AER 3CC ANA 6CC   Culture  Setup Time   Final    GRAM POSITIVE COCCI AEROBIC BOTTLE ONLY CRITICAL RESULT CALLED TO, READ BACK BY AND VERIFIED WITH: Malena PeerMelissa Azariya Freeman @ 0820 [facility]/04/16 [facility]    Culture PENDING  Incomplete   Report Status PENDING  Incomplete  Blood Culture ID Panel (Reflexed)     Status: Abnormal   Collection Time: 05/18/16  8:30 AM  Result Value Ref Range Status   Enterococcus species NOT DETECTED NOT DETECTED Final   Vancomycin resistance NOT DETECTED NOT DETECTED Final   Listeria monocytogenes NOT DETECTED NOT DETECTED Final   Staphylococcus species DETECTED (A) NOT DETECTED Final    Comment: CRITICAL RESULT CALLED TO, READ BACK BY AND VERIFIED WITH: Shawanna Zanders @ 0820 02/04/16 [facility]    Staphylococcus aureus NOT DETECTED NOT DETECTED Final   Methicillin resistance DETECTED (A) NOT DETECTED Final    Comment: CRITICAL RESULT CALLED TO, READ BACK BY AND  VERIFIED WITH: Malena Peer @ 1610 02/04/16 [facility]    Streptococcus species NOT DETECTED NOT DETECTED Final   Streptococcus agalactiae NOT DETECTED NOT DETECTED Final   Streptococcus pneumoniae NOT DETECTED NOT DETECTED Final   Streptococcus pyogenes NOT DETECTED NOT DETECTED Final   Acinetobacter baumannii NOT DETECTED NOT DETECTED Final   Enterobacteriaceae species NOT DETECTED NOT DETECTED Final   Enterobacter cloacae complex NOT DETECTED NOT DETECTED Final   Escherichia coli NOT DETECTED NOT DETECTED Final   Klebsiella oxytoca NOT DETECTED NOT DETECTED Final   Klebsiella pneumoniae NOT DETECTED NOT DETECTED Final   Proteus species NOT  DETECTED NOT DETECTED Final   Serratia marcescens NOT DETECTED NOT DETECTED Final   Carbapenem resistance NOT DETECTED NOT DETECTED Final   Haemophilus influenzae NOT DETECTED NOT DETECTED Final   Neisseria meningitidis NOT DETECTED NOT DETECTED Final   Pseudomonas aeruginosa NOT DETECTED NOT DETECTED Final   Candida albicans NOT DETECTED NOT DETECTED Final   Candida glabrata NOT DETECTED NOT DETECTED Final   Candida krusei NOT DETECTED NOT DETECTED Final   Candida parapsilosis NOT DETECTED NOT DETECTED Final   Candida tropicalis NOT DETECTED NOT DETECTED Final  MRSA PCR Screening     Status: None   Collection Time: 02/09/2016  2:24 PM  Result Value Ref Range Status   MRSA by PCR NEGATIVE NEGATIVE Final    Comment:        The GeneXpert MRSA Assay (FDA approved for NASAL specimens only), is one component of a comprehensive MRSA colonization surveillance program. It is not intended to diagnose MRSA infection nor to guide or monitor treatment for MRSA infections.     BCx: staph species MecA positive aerobic bottle BCx: pending UCx: pending   MRSA PCR: neg  Thank you for allowing pharmacy to be a part of this patient's care.  Olene Floss, Pharm.D Clinical Pharmacist   02/04/2016 8:26 AM

## 2016-02-04 NOTE — Progress Notes (Addendum)
LCSW consulted with ICU nurse about patients daughter. She reported that the neurologist spoke to the daughter and she was very emotional and left, It was agreed this SW would not contact patient daughter today. LCSW did complete assessment and Fl2 and will follow the patient over the weekend.  Delta Air LinesClaudine Joie Reamer LCSW [phone]

## 2016-02-04 NOTE — Progress Notes (Signed)
1002 Tube feeding at goal-[id number].

## 2016-02-04 NOTE — Progress Notes (Signed)
Patient remains intubated on vent, grimacing to pain and moving Left arm and toes on Left foot observed moving as well.   Fentanyl gtt started for patient comfort with improvement. TF infusing, urine output marginal, VSS. See CHL for further update

## 2016-02-04 NOTE — Progress Notes (Signed)
1510 Daughter(Debbie) POA friend and daughters father talked with Dr.Zeylikman on speaker phone to discuss Myriam JacobsonHelen Sinners code status. Prognosis poor and re-explained CT scan and extent of patients CVA per Dr.Zeylikman. Daughter admitted she had a signed DNR paper at home she was trying to ignore. After discussion with friend Daughter decided to change mothers code status to DNR. Dr. Richarda Osmondamshaundra paged. Daughter spoke with Dr. Richarda Osmondamshaundra and changed Ms. Sinners' code status to DNR.

## 2016-02-04 NOTE — Progress Notes (Signed)
0900 Dr. Ardyth Manam informed of runs of V.tach. And SVT. Orders received.

## 2016-02-04 NOTE — Clinical Social Work Note (Signed)
Clinical Social Work Assessment  Patient Details  Name: Catherine Holmes MRN: [id number] Date of Birth: Jan 08, 1928  Date of referral:  02/04/16               Reason for consult:  Discharge Planning                Permission sought to share information with:  Family Supports, Facility Sport and exercise psychologist Permission granted to share information::  Yes, Verbal Permission Granted  Name::     Alben Deeds ( Daughter) [phone]  Agency::  [facility]  Relationship::  yes  Contact Information:  yes  Housing/Transportation Living arrangements for the past 2 months:  [place] of Information:  Patient Patient Interpreter Needed:  None Criminal Activity/Legal Involvement Pertinent to Current Situation/Hospitalization:  No - Comment as needed Significant Relationships:  Adult Children Lives with:  Facility Resident Do you feel safe going back to the place where you live?  Yes Need for family participation in patient care:  Yes (Comment)  Care giving concerns:    Social Worker assessment / plan: LCSW met briefly with patient and she was intubated. LCSW reviewed nurses notes and and summaries to complete assessment. She has medicare AB and Scientist, clinical (histocompatibility and immunogenetics). She has chronic respitory failure and was a re-admit. She was here from March 21-30 and will return to [facility].  Employment status:  Retired Forensic scientist:    PT Recommendations:  Not assessed at this time Information / Referral to community resources:  [place]  Patient/Family's Response to care:  TBD  Patient/Family's Understanding of and Emotional Response to Diagnosis, Current Treatment, and Prognosis:  TBD  Emotional Assessment Appearance:  Appears stated age Attitude/Demeanor/Rapport:    Affect (typically observed):  Accepting, Adaptable Orientation:  Oriented to Self, Oriented to Place, Oriented to  Time Alcohol / Substance use:  Tobacco Use, Other (Quit  in 1996) Psych involvement (Current and /or in the community):  No (Comment)  Discharge Needs  Concerns to be addressed:  No discharge needs identified Readmission within the last 30 days:    Yes prior stay from March 21st-March 30th Current discharge risk:  None Barriers to Discharge:  No Barriers Identified   Joana Reamer, LCSW 02/04/2016, 11:26 AM

## 2016-02-04 NOTE — Progress Notes (Signed)
Fentanyl increased to 6 this am due to agitation. Failed weaning attempt. Dr. Loretha BrasilZeylikman in to update daughter on CVA severity and poor prognosis. Periods of agitation and right arm movement with eyes open and periods of unresponsiveness. Daughter changed patient to DNR status. Started on Mannitol 25grams every 6 hours to decrease cerebral edema.

## 2016-02-04 NOTE — Progress Notes (Signed)
PULMONARY / CRITICAL CARE MEDICINE   Name: Catherine Holmes MRN: [id number] DOB: [birth year]    ADMISSION DATE:  2016-02-12  PT PROFILE:   65 F with COPD, CHF recently hospitalized @ [facility] 03/21 - 03/30 for acute on chronic respiratory failure. She was transferred to [facility], then SNF for rehab. On the morning of this admission, she was found by SNF personnel agonal and unresponsive and transported to ED where she was intubated. CT head reveals massive R MCA territory CVA with cytotoxic edema  MAJOR EVENTS/TEST RESULTS: 04/14 Admission as above 04/14 CT head: large R MCA territory CVA with cytotoxic edema  INDWELLING DEVICES:: ETT 04/14 >>   MICRO DATA: Urine 04/14 >> pending Resp 04/14  >>  Blood 04/14 >> gram-positive cocci, identification pending. MRSA PCR 4/14, negative  ANTIMICROBIALS:  Vanc 04/14 >>  Cefepime 04/14 >>   HISTORY OF PRESENT ILLNESS:   Patient cannot provide history or review of systems as she is intubated on the ventilator and minimally responsive.  Allergies  Allergen Reactions  . Singlet [Chlorphen-Pe-Acetaminophen] Shortness Of Breath  . Atorvastatin Other (See Comments)    Reaction:  Unknown   . Clindamycin Diarrhea and Nausea And Vomiting  . Codeine Other (See Comments)    Reaction:  Unknown   . Doxycycline Hives and Itching  . Epinephrine Hives and Itching  . Erythromycin Hives and Itching  . Fluticasone-Salmeterol Other (See Comments)    Reaction:  Unknown   . Lasix [Furosemide] Itching  . Levofloxacin Other (See Comments)    Reaction:  Unknown   . Penicillins Other (See Comments)    Reaction:  Unknown   . Propoxyphene Hcl Other (See Comments)    Reaction:  Unknown   . Rosuvastatin Other (See Comments)    Reaction:  Unknown   . Simvastatin Other (See Comments)    Reaction:  Unknown   . Sulfamethoxazole-Trimethoprim Other (See Comments)    Reaction:  Unknown   . Teriparatide Other (See Comments)    Reaction:  Unknown   . Tetracyclines &  Related Hives and Itching  . Ceclor [Cefaclor] Hives and Other (See Comments)    Reaction:  Altered mental status    . Glipizide Hives and Itching  . Lisinopril Hives and Itching    Current Facility-Administered Medications on File Prior to Encounter  Medication  . 0.9 %  sodium chloride infusion  . alteplase (CATHFLO ACTIVASE) injection 2 mg  . ferumoxytol (FERAHEME) 510 mg in sodium chloride 0.9 % 100 mL IVPB  . heparin lock flush 100 unit/mL  . heparin lock flush 100 unit/mL  . sodium chloride 0.9 % injection 10 mL  . sodium chloride 0.9 % injection 3 mL   Current Outpatient Prescriptions on File Prior to Encounter  Medication Sig  . albuterol (PROVENTIL) (2.5 MG/3ML) 0.083% nebulizer solution Take 2.5 mg by nebulization every 6 (six) hours as needed for wheezing or shortness of breath.   . ALPRAZolam (XANAX) 0.25 MG tablet Take 1 tablet (0.25 mg total) by mouth 3 (three) times daily as needed for anxiety.  Marland Kitchen aspirin EC 81 MG tablet Take 81 mg by mouth daily with lunch.   . budesonide (PULMICORT) 0.25 MG/2ML nebulizer solution Take 2 mLs (0.25 mg total) by nebulization 2 (two) times daily.  . bumetanide (BUMEX) 1 MG tablet Take 1 tablet (1 mg total) by mouth 2 (two) times daily.  . calcium carbonate (OSCAL) 1500 (600 Ca) MG TABS tablet Take 600 mg of elemental calcium by mouth daily  with breakfast.  . chlorhexidine (PERIDEX) 0.12 % solution 15 mLs by Mouth Rinse route at bedtime.   . citalopram (CELEXA) 20 MG tablet Take 20 mg by mouth daily with lunch.   . diphenhydramine-acetaminophen (TYLENOL PM) 25-500 MG TABS tablet Take 1 tablet by mouth at bedtime as needed (for sleep).  Marland Kitchen. doxazosin (CARDURA) 4 MG tablet Take 4 mg by mouth at bedtime.   Marland Kitchen. ezetimibe (ZETIA) 10 MG tablet Take 10 mg by mouth at bedtime.   . ferrous sulfate 325 (65 FE) MG tablet Take 1 tablet (325 mg total) by mouth 2 (two) times daily with a meal.  . fluticasone (FLONASE) 50 MCG/ACT nasal spray Place 1 spray  into both nostrils 2 (two) times daily.   Marland Kitchen. gabapentin (NEURONTIN) 100 MG capsule Take 100 mg by mouth 2 (two) times daily.   Marland Kitchen. guaiFENesin-dextromethorphan (ROBITUSSIN DM) 100-10 MG/5ML syrup Take 5 mLs by mouth every 4 (four) hours as needed for cough.  . hydrALAZINE (APRESOLINE) 25 MG tablet Take 1 tablet (25 mg total) by mouth every 8 (eight) hours.  Marland Kitchen. HYDROcodone-acetaminophen (NORCO/VICODIN) 5-325 MG tablet Take 1 tablet by mouth every 4 (four) hours as needed for moderate pain.  . mometasone-formoterol (DULERA) 100-5 MCG/ACT AERO Inhale 2 puffs into the lungs 2 (two) times daily.  . mupirocin ointment (BACTROBAN) 2 % Apply 1 application topically daily as needed (for sores on leg).   . nebivolol (BYSTOLIC) 5 MG tablet Take 1 tablet (5 mg total) by mouth daily.  . nitroGLYCERIN (NITROSTAT) 0.4 MG SL tablet Place 0.4 mg under the tongue every 5 (five) minutes as needed for chest pain.  Bertram Gala. Polyethyl Glycol-Propyl Glycol (SYSTANE OP) Place 1 drop into both eyes 2 (two) times daily.  . polyvinyl alcohol (LIQUIFILM TEARS) 1.4 % ophthalmic solution Place 1 drop into both eyes 2 (two) times daily.  . promethazine (PHENERGAN) 25 MG tablet Take 25 mg by mouth every 8 (eight) hours as needed for nausea or vomiting.   Marland Kitchen. SPIRIVA RESPIMAT 1.25 MCG/ACT AERS Inhale 1 puff into the lungs daily.  Marland Kitchen. spironolactone (ALDACTONE) 25 MG tablet Take 25 mg by mouth every Monday, Wednesday, and Friday.  . tiotropium (SPIRIVA) 18 MCG inhalation capsule Place 1 capsule (18 mcg total) into inhaler and inhale daily.  . feeding supplement, ENSURE ENLIVE, (ENSURE ENLIVE) LIQD Take 237 mLs by mouth 2 (two) times daily between meals.    SUBJECTIVE:  Patient response to painful stimuli, does not respond to voice, no meaningful movement.  VITAL SIGNS: BP 115/85 mmHg  Pulse 92  Temp(Src) 99.7 F (37.6 C)  Resp 16  Ht 5\' 1"  (1.549 m)  Wt 159 lb 9.8 oz (72.4 kg)  BMI 30.17 kg/m2  SpO2 93%  HEMODYNAMICS:     VENTILATOR SETTINGS: Vent Mode:  [-] PSV FiO2 (%):  [30 %-80 %] 30 % Set Rate:  [16 bmp-22 bmp] 16 bmp Vt Set:  [450 mL-500 mL] 500 mL PEEP:  [5 cmH20] 5 cmH20 Pressure Support:  [10 cmH20] 10 cmH20  INTAKE / OUTPUT: I/O last 3 completed shifts: In: 3710 [I.V.:3145.4; NG/GT:514.7; IV Piggyback:50] Out: 550 [Urine:550]  PHYSICAL EXAMINATION: General: Elderly, chronically ill appearing, RASS -5, intubated Neuro: No spontaneous movement, PERRL, no withdrawal, decreased tone on L, no spontaneous movements of the left upper or lower extremity. HEENT: NCAT Cardiovascular: regular, no M noted Lungs: decreased BS, prolonged expiratory time, no wheezes, decreased air entry bilaterally. Abdomen: soft, NT, +BS Ext: multiple ecchymoses, no edema, warm  LABS:  BMET  Recent Labs Lab 03/02/2016 0835 02/04/16 0517  NA 135 133*  K 5.3* 3.6  CL 89* 93*  CO2 36* 33*  BUN 39* 40*  CREATININE 1.62* 0.91  GLUCOSE 232* 238*    Electrolytes  Recent Labs Lab 03-02-2016 0835 02/04/16 0517  CALCIUM 8.7* 7.9*  MG  --  1.6*  PHOS  --  1.8*    CBC  Recent Labs Lab Mar 02, 2016 0835 02/04/16 0517  WBC 20.0* 17.8*  HGB 10.5* 8.3*  HCT 31.7* 26.1*  PLT 339 270    Coag's  Recent Labs Lab 2016/03/02 0835  INR 1.28    Sepsis Markers  Recent Labs Lab 2016/03/02 0835 02/04/16 0517  LATICACIDVEN 1.9  --   PROCALCITON  --  0.85    ABG  Recent Labs Lab 2016-03-02 0930 2016-03-02 1250  PHART 7.33* 7.41  PCO2ART 68* 52*  PO2ART 53* 122*    Liver Enzymes  Recent Labs Lab 03-02-16 0835  AST 223*  ALT 187*  ALKPHOS 101  BILITOT 0.8  ALBUMIN 3.6    Cardiac Enzymes  Recent Labs Lab Mar 02, 2016 0835 Mar 02, 2016 1643 03/02/2016 2238  TROPONINI 0.46* 0.41* 0.40*    Glucose  Recent Labs Lab 03-02-16 1422 2016/03/02 1651 Mar 02, 2016 1932 03/02/2016 2335 02/04/16 0453 02/04/16 0739  GLUCAP 263* 257* 316* 324* 210* 217*    CXR:  Vague R basilar opacity   ASSESSMENT /  PLAN:  PULMONARY A: Acute on chronic respiratory failure COPD - by clinical history and exam, appears to be severe Suspect RLL PNA Review of chest x-ray films shows continued right lower lobe atelectasis. P:   Await sputum cultures. Continue empiric antibiotics. The patient was tried on a weaning trial of 5/5, after 15 minutes. The patient became very tachypneic and agitated, therefore, was placed on usual vent settings, where her symptoms resolved  CARDIOVASCULAR A:  H/O diastolic CHF H/O PAF MVP P:  Monitor rhythm and hemodynamics MAP goal > 65 mmHg  RENAL A:   AKI Mild hyperkalemia  P:   Monitor BMET intermittently Monitor I/Os Correct electrolytes as indicated Maintenance IVFs until TFs @ goal  GASTROINTESTINAL A:   No issues P:   SUP: IV PPI TF protocol  HEMATOLOGIC A:   Mild chronic anemia without acute blood loss P:  DVT px: SQ heparin Monitor CBC intermittently Transfuse per usual guidelines  INFECTIOUS A:   Suspected RLL PNA P:   Monitor temp, WBC count Micro and abx as above  ENDOCRINE A:   Chronic prednisone Risk of steroid induced hyperglycemia P:   Hyperglycemia, we'll start sliding scale insulin, decrease steroids. SSI - mod scale   NEUROLOGIC A:   Massive R MCA territory acute CVA Chronic debilitation P:   RASS goal: -1, -2 PAD protocol Neurology consultation requested 04/14    Wells Guiles M.D. Pager [phone]   02/04/2016, 10:01 AM  Critical Care Attestation.  I have personally obtained a history, examined the patient, evaluated laboratory and imaging results, formulated the assessment and plan and placed orders. The Patient requires high complexity decision making for assessment and support, frequent evaluation and titration of therapies, application of advanced monitoring technologies and extensive interpretation of multiple databases. The patient has critical illness that could lead imminently to failure of 1  or more organ systems and requires the highest level of physician preparedness to intervene.  Critical Care Time devoted to patient care services described in this note is 35 minutes and is exclusive of time spent in procedures.

## 2016-02-04 NOTE — NC FL2 (Cosign Needed)
[place] LEVEL OF CARE SCREENING TOOL     IDENTIFICATION  Patient Name: Catherine Holmes Birthdate: 04/06/1928 Sex: female Admission Date (Current Location): 01/25/2016  [facility] and [place] Number:  Engineering geologist and Address:  [facility], [address] [place], [place] [address]      Provider Number: [id number]  Attending Physician Name and Address:  Wilhelmina Mcardle, MD  Relative Name and Phone Number:       Current Level of Care: Hospital Recommended Level of Care: [place] Prior Approval Number:    Date Approved/Denied:   PASRR Number:    Discharge Plan: SNF    Current Diagnoses: Patient Active Problem List   Diagnosis Date Noted  . Respiratory arrest ([place]) 02/08/2016  . Dyspnea   . Pulmonary HTN ([place])   . SOB (shortness of breath)   . Symptomatic anemia   . Depression, major, single episode, mild ([place]) 01/12/2016  . Acute respiratory failure ([place]) 01/10/2016  . Pressure ulcer 11/14/2015  . COPD exacerbation ([place]) 11/13/2015  . Acute on chronic respiratory failure ([place]) 11/13/2015  . Anxiety and depression 11/10/2015  . Iron deficiency anemia 10/28/2015  . Musculoskeletal chest pain 07/29/2015  . Sternal fracture 07/29/2015  . Pneumonia 07/29/2015  . Acute on chronic respiratory failure with hypoxia and hypercapnia ([place]) 07/28/2015  . Hyponatremia 04/11/2015  . Syncope 04/10/2015  . Absolute anemia   . Acute on chronic diastolic CHF (congestive heart failure) ([place])   . CHF (congestive heart failure) ([place]) 03/31/2015  . Essential hypertension   . Diabetes mellitus type II, controlled ([place])   . Hyposmolality and/or hyponatremia 12/04/2013  . Anemia 12/04/2013  . Chronic diastolic CHF (congestive heart failure) ([place]) 04/05/2013  . Positive fecal occult blood test 04/05/2013  . Fall 11/20/2012  . Chest tightness 07/13/2011  . Diabetes type 2, uncontrolled ([place]) 11/29/2009  . Hyperlipidemia  11/29/2009  . TIC DOULOUREUX 11/29/2009  . HYPERTENSION, BENIGN 11/29/2009  . Mitral valve disorder 11/29/2009  . Congestive heart failure ([place]) 11/29/2009  . Carotid arterial disease ([place]) 11/29/2009  . TIA 11/29/2009  . PAD (peripheral artery disease) ([place]) 11/29/2009  . EMPHYSEMA 11/29/2009  . COPD (chronic obstructive pulmonary disease) ([place]) 11/29/2009  . GERD 11/29/2009  . [place] SYNDROME 11/29/2009  . ARTHRALGIA 11/29/2009  . OSTEOPOROSIS 11/29/2009  . DYSPNEA 11/29/2009    Orientation RESPIRATION BLADDER Height & Weight     Self, Time, Situation, Place  Vent, Other (Comment) (Intubated) Incontinent Weight: 159 lb 9.8 oz (72.4 kg) Height:  _0  (154.9 cm)  BEHAVIORAL SYMPTOMS/MOOD NEUROLOGICAL BOWEL NUTRITION STATUS      Incontinent Diet (soft-purred)  AMBULATORY STATUS COMMUNICATION OF NEEDS Skin   Extensive Assist Verbally Normal                       Personal Care Assistance Level of Assistance  Bathing, Feeding, Dressing Bathing Assistance: Maximum assistance Feeding assistance: Independent Dressing Assistance: Maximum assistance Total Care Assistance: Maximum assistance   Functional Limitations Info  Sight, Hearing, Speech Sight Info: Adequate Hearing Info: Adequate Speech Info: Adequate    SPECIAL CARE FACTORS FREQUENCY                       Contractures Contractures Info: Not present    Additional Factors Info  Code Status Code Status Info: full Allergies Info: See extensive list           Current Medications (02/04/2016):  This is the current hospital active medication list Current Facility-Administered Medications  Medication Dose Route Frequency Provider Last Rate Last Dose  . 0.9 %  sodium chloride infusion  250 mL Intravenous PRN Wilhelmina Mcardle, MD      . acetaminophen (TYLENOL) tablet 650 mg  650 mg Per Tube Q4H PRN Wilhelmina Mcardle, MD      . albuterol (PROVENTIL) (2.5 MG/3ML) 0.083% nebulizer solution 2.5 mg  2.5 mg  Nebulization Q3H PRN Wilhelmina Mcardle, MD      . amiodarone (PACERONE) tablet 200 mg  200 mg Oral BID Laverle Hobby, MD   200 mg at 02/04/16 1000  . antiseptic oral rinse solution (CORINZ)  7 mL Mouth Rinse QID Wilhelmina Mcardle, MD   7 mL at 02/04/16 0530  . bisacodyl (DULCOLAX) suppository 10 mg  10 mg Rectal Daily PRN Wilhelmina Mcardle, MD      . budesonide (PULMICORT) nebulizer solution 0.25 mg  0.25 mg Nebulization 4 times per day Wilhelmina Mcardle, MD   0.25 mg at 02/04/16 0748  . ceFEPIme (MAXIPIME) 2 g in dextrose 5 % 50 mL IVPB  2 g Intravenous Q24H Wilhelmina Mcardle, MD   2 g at 02/04/2016 2119  . chlorhexidine gluconate (SAGE KIT) (PERIDEX) 0.12 % solution 15 mL  15 mL Mouth Rinse BID Wilhelmina Mcardle, MD   15 mL at 02/04/16 0725  . docusate (COLACE) 50 MG/5ML liquid 100 mg  100 mg Per Tube BID PRN Wilhelmina Mcardle, MD      . feeding supplement (VITAL HIGH PROTEIN) liquid 1,000 mL  1,000 mL Per Tube Q24H Laverle Hobby, MD      . fentaNYL (SUBLIMAZE) injection 50 mcg  50 mcg Intravenous Q15 min PRN Wilhelmina Mcardle, MD   50 mcg at 01/28/2016 2208  . fentaNYL (SUBLIMAZE) injection 50 mcg  50 mcg Intravenous Q2H PRN Wilhelmina Mcardle, MD   50 mcg at 02/19/2016 2029  . fentaNYL 2523mg in NS 2585m(1065mml) infusion-PREMIX  25-400 mcg/hr Intravenous Continuous MagMikael SprayP 5 mL/hr at 02/04/16 0025 50 mcg/hr at 02/04/16 0025  . free water 100 mL  100 mL Per Tube 3 times per day DavWilhelmina McardleD   100 mL at 02/04/16 0547  . heparin injection 5,000 Units  5,000 Units Subcutaneous 3 times per day DavWilhelmina McardleD   5,000 Units at 02/04/16 0558  . hydrocortisone sodium succinate (SOLU-CORTEF) 100 MG injection 25 mg  25 mg Intravenous Q12H PraLaverle HobbyD      . insulin aspart (novoLOG) injection 0-20 Units  0-20 Units Subcutaneous 6 times per day MagMikael SprayP   7 Units at 02/04/16 [id number] ipratropium-albuterol (DUONEB) 0.5-2.5 (3) MG/3ML nebulizer solution 3 mL  3 mL  Nebulization Q6H DavWilhelmina McardleD   3 mL at 02/04/16 0748  . lactated ringers infusion   Intravenous Continuous DavWilhelmina McardleD 50 mL/hr at 02/04/2016 1412    . mannitol 25 g in dextrose 5 % 500 mL infusion  25 g/hr Intravenous QID YurLeotis PainD      . midazolam (VERSED) injection 2-4 mg  2-4 mg Intravenous Q15 min PRN MagMikael SprayP      . midazolam (VERSED) injection 2-4 mg  2-4 mg Intravenous Q2H PRN MagMikael SprayP   2 mg at 02/08/2016 2358  . ondansetron (ZOFRAN) injection 4 mg  4 mg Intravenous Q6H PRN DavZella Richer  Simonds, MD      . pantoprazole (PROTONIX) injection 40 mg  40 mg Intravenous QHS Wilhelmina Mcardle, MD   40 mg at 02/18/2016 2112  . vancomycin (VANCOCIN) IVPB 750 mg/150 ml premix  750 mg Intravenous Q36H Wilhelmina Mcardle, MD   750 mg at 02/04/16 0300   Facility-Administered Medications Ordered in Other Encounters  Medication Dose Route Frequency Provider Last Rate Last Dose  . 0.9 %  sodium chloride infusion   Intravenous Once Lequita Asal, MD      . alteplase (CATHFLO ACTIVASE) injection 2 mg  2 mg Intracatheter Once PRN Lequita Asal, MD      . ferumoxytol (FERAHEME) 510 mg in sodium chloride 0.9 % 100 mL IVPB  510 mg Intravenous Once Lequita Asal, MD      . heparin lock flush 100 unit/mL  500 Units Intracatheter Once PRN Lequita Asal, MD      . heparin lock flush 100 unit/mL  250 Units Intracatheter Once PRN Lequita Asal, MD      . sodium chloride 0.9 % injection 10 mL  10 mL Intracatheter PRN Lequita Asal, MD      . sodium chloride 0.9 % injection 3 mL  3 mL Intravenous Once PRN Lequita Asal, MD         Discharge Medications: Please see discharge summary for a list of discharge medications.  Relevant Imaging Results:  Relevant Lab Results:   Additional Information SSN [id number] [place], [place]

## 2016-02-05 ENCOUNTER — Inpatient Hospital Stay: Payer: Medicare Other

## 2016-02-05 LAB — BASIC METABOLIC PANEL
ANION GAP: 5 (ref 5–15)
BUN: 37 mg/dL — ABNORMAL HIGH (ref 6–20)
CO2: 32 mmol/L (ref 22–32)
Calcium: 7.8 mg/dL — ABNORMAL LOW (ref 8.9–10.3)
Chloride: 94 mmol/L — ABNORMAL LOW (ref 101–111)
Creatinine, Ser: 0.68 mg/dL (ref 0.44–1.00)
GFR calc Af Amer: >60 mL/min (ref >60)
GLUCOSE: 230 mg/dL — AB (ref 65–99)
POTASSIUM: 4 mmol/L (ref 3.5–5.1)
Sodium: 131 mmol/L — ABNORMAL LOW (ref 135–145)

## 2016-02-05 LAB — BLOOD GAS, ARTERIAL
Acid-Base Excess: 11.1 mmol/L — ABNORMAL HIGH (ref 0.0–3.0)
Bicarbonate: 36.7 mEq/L — ABNORMAL HIGH (ref 21.0–28.0)
FIO2: 0.4
Mechanical Rate: 16
O2 SAT: 99.4 %
PATIENT TEMPERATURE: 37
PCO2 ART: 54 mmHg — AB (ref 32.0–48.0)
PEEP/CPAP: 5 cmH2O
PH ART: 7.44 (ref 7.350–7.450)
PO2 ART: 148 mmHg — AB (ref 83.0–108.0)
VT: 500 mL

## 2016-02-05 LAB — GLUCOSE, CAPILLARY
GLUCOSE-CAPILLARY: 181 mg/dL — AB (ref 65–99)
Glucose-Capillary: 161 mg/dL — ABNORMAL HIGH (ref 65–99)
Glucose-Capillary: 186 mg/dL — ABNORMAL HIGH (ref 65–99)
Glucose-Capillary: 247 mg/dL — ABNORMAL HIGH (ref 65–99)
Glucose-Capillary: 306 mg/dL — ABNORMAL HIGH (ref 65–99)

## 2016-02-05 LAB — URINE CULTURE: Culture: >=100000 — AB

## 2016-02-05 LAB — OSMOLALITY
OSMOLALITY: 302 mosm/kg — AB (ref 275–295)
OSMOLALITY: 294 mosm/kg (ref 275–295)
Osmolality: 292 mOsm/kg (ref 275–295)
Osmolality: 296 mOsm/kg — ABNORMAL HIGH (ref 275–295)
Osmolality: 297 mOsm/kg — ABNORMAL HIGH (ref 275–295)

## 2016-02-05 LAB — CBC
HEMATOCRIT: 24.4 % — AB (ref 35.0–47.0)
Hemoglobin: 7.7 g/dL — ABNORMAL LOW (ref 12.0–16.0)
MCH: 26.9 pg (ref 26.0–34.0)
MCHC: 31.8 g/dL — ABNORMAL LOW (ref 32.0–36.0)
MCV: 84.6 fL (ref 80.0–100.0)
PLATELETS: 288 10*3/uL (ref 150–440)
RBC: 2.88 MIL/uL — AB (ref 3.80–5.20)
RDW: 23.7 % — ABNORMAL HIGH (ref 11.5–14.5)
WBC: 14.1 10*3/uL — AB (ref 3.6–11.0)

## 2016-02-05 LAB — PROCALCITONIN: Procalcitonin: 0.5 ng/mL

## 2016-02-05 MED ORDER — MANNITOL 25 % IV SOLN
25.0000 g/h | Freq: Four times a day (QID) | INTRAVENOUS | Status: DC
  Administered 2016-02-05 – 2016-02-06 (×6): 25 g/h via INTRAVENOUS
  Filled 2016-02-05 (×10): qty 100

## 2016-02-05 NOTE — Progress Notes (Signed)
°   02/05/16 1600  Clinical Encounter Type  Visited With Patient and family together  Visit Type Initial  Referral From Nurse  Consult/Referral To Chaplain  Visited with patient's daughter at bedside. Offered support and care. Chaplain [employer] Group[employer]elyn Crews Ext [phone]

## 2016-02-05 NOTE — Progress Notes (Signed)
PULMONARY / CRITICAL CARE MEDICINE   Name: Catherine Holmes MRN: [id number] DOB: [birth year]    ADMISSION DATE:  2016-02-12  PT PROFILE:   65 F with COPD, CHF recently hospitalized @ [facility] 03/21 - 03/30 for acute on chronic respiratory failure. She was transferred to [facility], then SNF for rehab. On the morning of this admission, she was found by SNF personnel agonal and unresponsive and transported to ED where she was intubated. CT head reveals massive R MCA territory CVA with cytotoxic edema  MAJOR EVENTS/TEST RESULTS: 04/14 Admission as above 04/14 CT head: large R MCA territory CVA with cytotoxic edema  INDWELLING DEVICES:: ETT 04/14 >>   MICRO DATA: Urine 04/14 >> pending Resp 04/14  >>  Blood 04/14 >> gram-positive cocci, identification pending. MRSA PCR 4/14, negative  ANTIMICROBIALS:  Vanc 04/14 >>  Cefepime 04/14 >>   HISTORY OF PRESENT ILLNESS:   Patient cannot provide history or review of systems as she is intubated on the ventilator and minimally responsive.  Allergies  Allergen Reactions  . Singlet [Chlorphen-Pe-Acetaminophen] Shortness Of Breath  . Atorvastatin Other (See Comments)    Reaction:  Unknown   . Clindamycin Diarrhea and Nausea And Vomiting  . Codeine Other (See Comments)    Reaction:  Unknown   . Doxycycline Hives and Itching  . Epinephrine Hives and Itching  . Erythromycin Hives and Itching  . Fluticasone-Salmeterol Other (See Comments)    Reaction:  Unknown   . Lasix [Furosemide] Itching  . Levofloxacin Other (See Comments)    Reaction:  Unknown   . Penicillins Other (See Comments)    Reaction:  Unknown   . Propoxyphene Hcl Other (See Comments)    Reaction:  Unknown   . Rosuvastatin Other (See Comments)    Reaction:  Unknown   . Simvastatin Other (See Comments)    Reaction:  Unknown   . Sulfamethoxazole-Trimethoprim Other (See Comments)    Reaction:  Unknown   . Teriparatide Other (See Comments)    Reaction:  Unknown   . Tetracyclines &  Related Hives and Itching  . Ceclor [Cefaclor] Hives and Other (See Comments)    Reaction:  Altered mental status    . Glipizide Hives and Itching  . Lisinopril Hives and Itching    Current Facility-Administered Medications on File Prior to Encounter  Medication  . 0.9 %  sodium chloride infusion  . alteplase (CATHFLO ACTIVASE) injection 2 mg  . ferumoxytol (FERAHEME) 510 mg in sodium chloride 0.9 % 100 mL IVPB  . heparin lock flush 100 unit/mL  . heparin lock flush 100 unit/mL  . sodium chloride 0.9 % injection 10 mL  . sodium chloride 0.9 % injection 3 mL   Current Outpatient Prescriptions on File Prior to Encounter  Medication Sig  . albuterol (PROVENTIL) (2.5 MG/3ML) 0.083% nebulizer solution Take 2.5 mg by nebulization every 6 (six) hours as needed for wheezing or shortness of breath.   . ALPRAZolam (XANAX) 0.25 MG tablet Take 1 tablet (0.25 mg total) by mouth 3 (three) times daily as needed for anxiety.  Marland Kitchen aspirin EC 81 MG tablet Take 81 mg by mouth daily with lunch.   . budesonide (PULMICORT) 0.25 MG/2ML nebulizer solution Take 2 mLs (0.25 mg total) by nebulization 2 (two) times daily.  . bumetanide (BUMEX) 1 MG tablet Take 1 tablet (1 mg total) by mouth 2 (two) times daily.  . calcium carbonate (OSCAL) 1500 (600 Ca) MG TABS tablet Take 600 mg of elemental calcium by mouth daily  with breakfast.  . chlorhexidine (PERIDEX) 0.12 % solution 15 mLs by Mouth Rinse route at bedtime.   . citalopram (CELEXA) 20 MG tablet Take 20 mg by mouth daily with lunch.   . diphenhydramine-acetaminophen (TYLENOL PM) 25-500 MG TABS tablet Take 1 tablet by mouth at bedtime as needed (for sleep).  Marland Kitchen doxazosin (CARDURA) 4 MG tablet Take 4 mg by mouth at bedtime.   Marland Kitchen ezetimibe (ZETIA) 10 MG tablet Take 10 mg by mouth at bedtime.   . ferrous sulfate 325 (65 FE) MG tablet Take 1 tablet (325 mg total) by mouth 2 (two) times daily with a meal.  . fluticasone (FLONASE) 50 MCG/ACT nasal spray Place 1 spray  into both nostrils 2 (two) times daily.   Marland Kitchen gabapentin (NEURONTIN) 100 MG capsule Take 100 mg by mouth 2 (two) times daily.   Marland Kitchen guaiFENesin-dextromethorphan (ROBITUSSIN DM) 100-10 MG/5ML syrup Take 5 mLs by mouth every 4 (four) hours as needed for cough.  . hydrALAZINE (APRESOLINE) 25 MG tablet Take 1 tablet (25 mg total) by mouth every 8 (eight) hours.  Marland Kitchen HYDROcodone-acetaminophen (NORCO/VICODIN) 5-325 MG tablet Take 1 tablet by mouth every 4 (four) hours as needed for moderate pain.  . mometasone-formoterol (DULERA) 100-5 MCG/ACT AERO Inhale 2 puffs into the lungs 2 (two) times daily.  . mupirocin ointment (BACTROBAN) 2 % Apply 1 application topically daily as needed (for sores on leg).   . nebivolol (BYSTOLIC) 5 MG tablet Take 1 tablet (5 mg total) by mouth daily.  . nitroGLYCERIN (NITROSTAT) 0.4 MG SL tablet Place 0.4 mg under the tongue every 5 (five) minutes as needed for chest pain.  Bertram Gala Glycol-Propyl Glycol (SYSTANE OP) Place 1 drop into both eyes 2 (two) times daily.  . polyvinyl alcohol (LIQUIFILM TEARS) 1.4 % ophthalmic solution Place 1 drop into both eyes 2 (two) times daily.  . promethazine (PHENERGAN) 25 MG tablet Take 25 mg by mouth every 8 (eight) hours as needed for nausea or vomiting.   Marland Kitchen SPIRIVA RESPIMAT 1.25 MCG/ACT AERS Inhale 1 puff into the lungs daily.  Marland Kitchen spironolactone (ALDACTONE) 25 MG tablet Take 25 mg by mouth every Monday, Wednesday, and Friday.  . tiotropium (SPIRIVA) 18 MCG inhalation capsule Place 1 capsule (18 mcg total) into inhaler and inhale daily.  . feeding supplement, ENSURE ENLIVE, (ENSURE ENLIVE) LIQD Take 237 mLs by mouth 2 (two) times daily between meals.    SUBJECTIVE:  Patient response to painful stimuli, does not respond to voice, no meaningful movement.  VITAL SIGNS: BP 129/46 mmHg  Pulse 74  Temp(Src) 101.1 F (38.4 C) (Core (Comment))  Resp 16  Ht  (1.549 m)  Wt 168 lb 14 oz (76.6 kg)  BMI 31.92 kg/m2  SpO2  100%  HEMODYNAMICS:    VENTILATOR SETTINGS: Vent Mode:  [-] PRVC FiO2 (%):  [30 %-40 %] 40 % Set Rate:  [16 bmp] 16 bmp Vt Set:  [500 mL] 500 mL PEEP:  [5 cmH20] 5 cmH20 Pressure Support:  [10 cmH20] 10 cmH20  INTAKE / OUTPUT: I/O last 3 completed shifts: In: 4689.6 [I.V.:2034.6; NG/GT:1555; IV Piggyback:1100] Out: 1750 [Urine:1750]  PHYSICAL EXAMINATION: General: Elderly, chronically ill appearing, RASS -5, intubated Neuro: No spontaneous movement, PERRL, no withdrawal, decreased tone on L, no spontaneous movements of the left upper or lower extremity. HEENT: NCAT Cardiovascular: regular, no M noted Lungs: decreased BS, prolonged expiratory time, no wheezes, decreased air entry bilaterally. Abdomen: soft, NT, +BS Ext: multiple ecchymoses, no edema, warm  LABS:  BMET  Recent Labs Lab 01/22/2016 0835 02/04/16 0517 02/05/16 0539  NA 135 133* 131*  K 5.3* 3.6 4.0  CL 89* 93* 94*  CO2 36* 33* 32  BUN 39* 40* 37*  CREATININE 1.62* 0.91 0.68  GLUCOSE 232* 238* 230*    Electrolytes  Recent Labs Lab 01/21/2016 0835 02/04/16 0517 02/05/16 0539  CALCIUM 8.7* 7.9* 7.8*  MG  --  1.6*  --   PHOS  --  1.8*  --     CBC  Recent Labs Lab 02/10/2016 0835 02/04/16 0517 02/05/16 0539  WBC 20.0* 17.8* 14.1*  HGB 10.5* 8.3* 7.7*  HCT 31.7* 26.1* 24.4*  PLT 339 270 288    Coag's  Recent Labs Lab 02/17/2016 0835  INR 1.28    Sepsis Markers  Recent Labs Lab 02/10/2016 0835 02/04/16 0517 02/05/16 0539  LATICACIDVEN 1.9  --   --   PROCALCITON  --  0.85 0.50    ABG  Recent Labs Lab 01/29/2016 0930 01/23/2016 1250 02/05/16 0453  PHART 7.33* 7.41 7.44  PCO2ART 68* 52* 54*  PO2ART 53* 122* 148*    Liver Enzymes  Recent Labs Lab 02/16/2016 0835  AST 223*  ALT 187*  ALKPHOS 101  BILITOT 0.8  ALBUMIN 3.6    Cardiac Enzymes  Recent Labs Lab 01/31/2016 0835 02/17/2016 1643 02/09/2016 2238  TROPONINI 0.46* 0.41* 0.40*    Glucose  Recent Labs Lab  02/04/16 1615 02/04/16 2047 02/04/16 2344 02/05/16 0406 02/05/16 0720 02/05/16 1157  GLUCAP 234* 226* 260* 247* 181* 161*    CXR:  Vague R basilar opacity   ASSESSMENT / PLAN:  PULMONARY A: Acute on chronic respiratory failure COPD - by clinical history and exam, appears to be severe Suspect RLL PNA Review of chest x-ray films shows continued right lower lobe atelectasis. P:   Await sputum cultures. Continue empiric antibiotics. The patient was tried on a weaning trial of 5/5, after 15 minutes. The patient became very tachypneic and agitated, therefore, was placed on usual vent settings, where her symptoms resolved  CARDIOVASCULAR A:  H/O diastolic CHF H/O PAF MVP P:  Monitor rhythm and hemodynamics MAP goal > 65 mmHg  RENAL A:   AKI Mild hyperkalemia  P:   Monitor BMET intermittently Monitor I/Os Correct electrolytes as indicated Maintenance IVFs until TFs @ goal  GASTROINTESTINAL A:   No issues P:   SUP: IV PPI TF protocol  HEMATOLOGIC A:   Mild chronic anemia without acute blood loss P:  DVT px: SQ heparin Monitor CBC intermittently Transfuse per usual guidelines  INFECTIOUS A:   Suspected RLL PNA P:   Monitor temp, WBC count Micro and abx as above  ENDOCRINE A:   Chronic prednisone Risk of steroid induced hyperglycemia P:   Hyperglycemia, we'll start sliding scale insulin, decrease steroids. SSI - mod scale   NEUROLOGIC A:   Massive R MCA territory acute CVA Chronic debilitation P:   RASS goal: -1, -2 PAD protocol Neurology consultation requested 04/14    Wells Guileseep Deoni Cosey M.D. Pager [phone]   02/05/2016, 12:26 PM  Critical Care Attestation.  I have personally obtained a history, examined the patient, evaluated laboratory and imaging results, formulated the assessment and plan and placed orders. The Patient requires high complexity decision making for assessment and support, frequent evaluation and titration of  therapies, application of advanced monitoring technologies and extensive interpretation of multiple databases. The patient has critical illness that could lead imminently to failure of 1 or more organ systems  and requires the highest level of physician preparedness to intervene.  Critical Care Time devoted to patient care services described in this note is 35 minutes and is exclusive of time spent in procedures.

## 2016-02-05 NOTE — H&P (Signed)
CC: R MCA stroke   S/p multiple discussions with family yesterday. Pt is now DNR. CTH R MCA stroke.    Past Medical History  Diagnosis Date  . Essential hypertension   . Diabetes mellitus type II, controlled (HCC)   . COPD (chronic obstructive pulmonary disease) (HCC)   . Osteoporosis   . Carotid arterial disease (HCC)     a. 2012 Carotid dopplers: 40-59% R ICA, 60-79% L ICA  . Tic douloureux   . Arthralgia   . Sjoegren syndrome (HCC)   . TIA (transient ischemic attack)   . Emphysema   . MVP (mitral valve prolapse)   . Chronic diastolic CHF (congestive heart failure) (HCC)     a. 08/2011 Echo: EF 65-70%, mild LVH, mod dil LA, mildly dil RA, mild MR, mild-mod AoV Sclerosis w/o stenosis, mod-sev elev PA pressures, mild TR.  Marland Kitchen Acid reflux   . Diverticulitis   . Anemia   . History of pneumonia   . Syncope and collapse   . Anemia   . Hip fracture, left (HCC)   . Hyperlipidemia   . Hiatal hernia   . Chest pain     a. 08/2011 MV: EF 74%, no ischemia.  . Noncompliance     a. h/o not taking bumex as Rx.  Marland Kitchen PAF (paroxysmal atrial fibrillation) (HCC)     a. CHA2DS2VASc = 8 ->No OAC 2/2 falls.    Past Surgical History  Procedure Laterality Date  . Cholecystectomy  1965  . Abdominal hysterectomy  1964  . Cataract extraction Bilateral   . Hip fracture surgery  2014    left hip    Family History  Problem Relation Age of Onset  . Heart attack Mother   . Heart attack Father   . Heart attack Sister     Social History:  reports that she quit smoking about 21 years ago. Her smoking use included Cigarettes. She has a 15 pack-year smoking history. She has never used smokeless tobacco. She reports that she drinks alcohol. She reports that she does not use illicit drugs.  Allergies  Allergen Reactions  . Singlet [Chlorphen-Pe-Acetaminophen] Shortness Of Breath  . Atorvastatin Other (See Comments)    Reaction:  Unknown   . Clindamycin Diarrhea and Nausea And Vomiting  .  Codeine Other (See Comments)    Reaction:  Unknown   . Doxycycline Hives and Itching  . Epinephrine Hives and Itching  . Erythromycin Hives and Itching  . Fluticasone-Salmeterol Other (See Comments)    Reaction:  Unknown   . Lasix [Furosemide] Itching  . Levofloxacin Other (See Comments)    Reaction:  Unknown   . Penicillins Other (See Comments)    Reaction:  Unknown   . Propoxyphene Hcl Other (See Comments)    Reaction:  Unknown   . Rosuvastatin Other (See Comments)    Reaction:  Unknown   . Simvastatin Other (See Comments)    Reaction:  Unknown   . Sulfamethoxazole-Trimethoprim Other (See Comments)    Reaction:  Unknown   . Teriparatide Other (See Comments)    Reaction:  Unknown   . Tetracyclines & Related Hives and Itching  . Ceclor [Cefaclor] Hives and Other (See Comments)    Reaction:  Altered mental status    . Glipizide Hives and Itching  . Lisinopril Hives and Itching    Medications: I have reviewed the patient's current medications.  ROS: Unable to obtain due to intubation, not following commands.   Physical Examination: Blood pressure  129/46, pulse 74, temperature 101.1 F (38.4 C), temperature source Core (Comment), resp. rate 16, height 5\' 1"  (1.549 m), weight 168 lb 14 oz (76.6 kg), SpO2 100 %.  Overbreathing the vent.  EOM not able to access Pupils sluggish but reatctive Withdraws from pain  RUE and RLE Occasionally squeezing LUE, could be spontaneous/not purposeful.  Tripple flexion LUE    Laboratory Studies:   Basic Metabolic Panel:  Recent Labs Lab 02-15-2016 0835 02/04/16 0517 02/05/16 0539  NA 135 133* 131*  K 5.3* 3.6 4.0  CL 89* 93* 94*  CO2 36* 33* 32  GLUCOSE 232* 238* 230*  BUN 39* 40* 37*  CREATININE 1.62* 0.91 0.68  CALCIUM 8.7* 7.9* 7.8*  MG  --  1.6*  --   PHOS  --  1.8*  --     Liver Function Tests:  Recent Labs Lab 15-Feb-2016 0835  AST 223*  ALT 187*  ALKPHOS 101  BILITOT 0.8  PROT 6.7  ALBUMIN 3.6    Recent  Labs Lab February 15, 2016 0835  LIPASE <10*   No results for input(s): AMMONIA in the last 168 hours.  CBC:  Recent Labs Lab 02/15/2016 0835 02/04/16 0517 02/05/16 0539  WBC 20.0* 17.8* 14.1*  NEUTROABS 17.9*  --   --   HGB 10.5* 8.3* 7.7*  HCT 31.7* 26.1* 24.4*  MCV 84.5 84.4 84.6  PLT 339 270 288    Cardiac Enzymes:  Recent Labs Lab 02/15/2016 0835 Feb 15, 2016 1643 Feb 15, 2016 2238  TROPONINI 0.46* 0.41* 0.40*    BNP: Invalid input(s): POCBNP  CBG:  Recent Labs Lab 02/04/16 2047 02/04/16 2344 02/05/16 0406 02/05/16 0720 02/05/16 1157  GLUCAP 226* 260* 247* 181* 161*    Microbiology: Results for orders placed or performed during the hospital encounter of 2016-02-15  Blood Culture (routine x 2)     Status: None (Preliminary result)   Collection Time: 02-15-2016  8:30 AM  Result Value Ref Range Status   Specimen Description BLOOD LEFT ARM  Final   Special Requests   Final    BOTTLES DRAWN AEROBIC AND ANAEROBIC  AER 3CC ANA 6CC   Culture  Setup Time   Final    GRAM POSITIVE COCCI AEROBIC BOTTLE ONLY CRITICAL RESULT CALLED TO, READ BACK BY AND VERIFIED WITH: Malena Peer @ 0820 02/04/16 by [facility]    Culture   Final    COAGULASE NEGATIVE STAPHYLOCOCCUS AEROBIC BOTTLE ONLY Results consistent with contamination.    Report Status PENDING  Incomplete  Blood Culture ID Panel (Reflexed)     Status: Abnormal   Collection Time: 02-15-16  8:30 AM  Result Value Ref Range Status   Enterococcus species NOT DETECTED NOT DETECTED Final   Vancomycin resistance NOT DETECTED NOT DETECTED Final   Listeria monocytogenes NOT DETECTED NOT DETECTED Final   Staphylococcus species DETECTED (A) NOT DETECTED Final    Comment: CRITICAL RESULT CALLED TO, READ BACK BY AND VERIFIED WITH: Melissa Maccia @ 0820 02/04/16 [facility]    Staphylococcus aureus NOT DETECTED NOT DETECTED Final   Methicillin resistance DETECTED (A) NOT DETECTED Final    Comment: CRITICAL RESULT CALLED TO, READ BACK BY AND VERIFIED  WITH: Malena Peer @ 0820 02/04/16 [facility]    Streptococcus species NOT DETECTED NOT DETECTED Final   Streptococcus agalactiae NOT DETECTED NOT DETECTED Final   Streptococcus pneumoniae NOT DETECTED NOT DETECTED Final   Streptococcus pyogenes NOT DETECTED NOT DETECTED Final   Acinetobacter baumannii NOT DETECTED NOT DETECTED Final   Enterobacteriaceae species NOT DETECTED  NOT DETECTED Final   Enterobacter cloacae complex NOT DETECTED NOT DETECTED Final   Escherichia coli NOT DETECTED NOT DETECTED Final   Klebsiella oxytoca NOT DETECTED NOT DETECTED Final   Klebsiella pneumoniae NOT DETECTED NOT DETECTED Final   Proteus species NOT DETECTED NOT DETECTED Final   Serratia marcescens NOT DETECTED NOT DETECTED Final   Carbapenem resistance NOT DETECTED NOT DETECTED Final   Haemophilus influenzae NOT DETECTED NOT DETECTED Final   Neisseria meningitidis NOT DETECTED NOT DETECTED Final   Pseudomonas aeruginosa NOT DETECTED NOT DETECTED Final   Candida albicans NOT DETECTED NOT DETECTED Final   Candida glabrata NOT DETECTED NOT DETECTED Final   Candida krusei NOT DETECTED NOT DETECTED Final   Candida parapsilosis NOT DETECTED NOT DETECTED Final   Candida tropicalis NOT DETECTED NOT DETECTED Final  Blood Culture (routine x 2)     Status: None (Preliminary result)   Collection Time: February 17, 2016  8:35 AM  Result Value Ref Range Status   Specimen Description BLOOD RIGHT ARM  Final   Special Requests BOTTLES DRAWN AEROBIC ONLY  3CC  Final   Culture NO GROWTH 2 DAYS  Final   Report Status PENDING  Incomplete  Urine culture     Status: Abnormal   Collection Time: 02-17-16  8:35 AM  Result Value Ref Range Status   Specimen Description URINE, RANDOM  Final   Special Requests NONE  Final   Culture >=100,000 COLONIES/mL PROTEUS MIRABILIS (A)  Final   Report Status 02/05/2016 FINAL  Final   Organism ID, Bacteria PROTEUS MIRABILIS (A)  Final      Susceptibility   Proteus mirabilis - MIC*    AMPICILLIN  <=2 SENSITIVE Sensitive     CEFAZOLIN <=4 SENSITIVE Sensitive     CEFTRIAXONE <=1 SENSITIVE Sensitive     CIPROFLOXACIN <=0.25 SENSITIVE Sensitive     GENTAMICIN <=1 SENSITIVE Sensitive     IMIPENEM 2 SENSITIVE Sensitive     NITROFURANTOIN 128 RESISTANT Resistant     TRIMETH/SULFA <=20 SENSITIVE Sensitive     AMPICILLIN/SULBACTAM <=2 SENSITIVE Sensitive     PIP/TAZO <=4 SENSITIVE Sensitive     * >=100,000 COLONIES/mL PROTEUS MIRABILIS  MRSA PCR Screening     Status: None   Collection Time: 02-17-2016  2:24 PM  Result Value Ref Range Status   MRSA by PCR NEGATIVE NEGATIVE Final    Comment:        The GeneXpert MRSA Assay (FDA approved for NASAL specimens only), is one component of a comprehensive MRSA colonization surveillance program. It is not intended to diagnose MRSA infection nor to guide or monitor treatment for MRSA infections.   Culture, expectorated sputum-assessment     Status: None   Collection Time: 02/04/16 11:13 AM  Result Value Ref Range Status   Specimen Description EXPECTORATED SPUTUM  Final   Special Requests NONE  Final   Sputum evaluation THIS SPECIMEN IS ACCEPTABLE FOR SPUTUM CULTURE  Final   Report Status 02/04/2016 FINAL  Final  Culture, respiratory (NON-Expectorated)     Status: None (Preliminary result)   Collection Time: 02/04/16 11:13 AM  Result Value Ref Range Status   Specimen Description EXPECTORATED SPUTUM  Final   Special Requests NONE Reflexed from [id number]  Final   Gram Stain PENDING  Incomplete   Culture HOLDING FOR POSSIBLE PATHOGEN  Final   Report Status PENDING  Incomplete    Coagulation Studies:  Recent Labs  02/17/2016 0835  LABPROT 16.1*  INR 1.28    Urinalysis: No results  for input(s): COLORURINE, LABSPEC, PHURINE, GLUCOSEU, HGBUR, BILIRUBINUR, KETONESUR, PROTEINUR, UROBILINOGEN, NITRITE, LEUKOCYTESUR in the last 168 hours.  Invalid input(s): APPERANCEUR  Lipid Panel:  No results found for: CHOL, TRIG, HDL, CHOLHDL, VLDL,  LDLCALC  HgbA1C:  Lab Results  Component Value Date   HGBA1C 6.2* 01/11/2016    Urine Drug Screen:  No results found for: LABOPIA, COCAINSCRNUR, LABBENZ, AMPHETMU, THCU, LABBARB  Alcohol Level: No results for input(s): ETH in the last 168 hours.  Other results: EKG: normal EKG, normal sinus rhythm, unchanged from previous tracings.  Imaging: Dg Chest 1 View  02/05/2016  CLINICAL DATA:  [age] year old female with history of dyspnea. Hypertension. COPD. EXAM: CHEST 1 VIEW COMPARISON:  Chest x-ray 02/04/2016. FINDINGS: A nasogastric tube is seen extending into the stomach, however, the tip of the nasogastric tube extends below the lower margin of the image. Lung volumes are normal. Bibasilar opacities may reflect areas of atelectasis and/or airspace consolidation with superimposed small bilateral pleural effusions. Peribronchial cuffing and diffuse interstitial prominence throughout the lungs bilaterally. No cephalization of the pulmonary vasculature. Mild cardiomegaly. Atherosclerosis in the thoracic aorta. IMPRESSION: 1. Support apparatus, as above. 2. Diffuse peribronchial cuffing and interstitial prominence, concerning for an acute bronchitis. 3. Bibasilar areas of atelectasis and/or airspace consolidation with superimposed small bilateral pleural effusions. Electronically Signed   By: Trudie Reedaniel  Entrikin M.D.   On: 02/05/2016 08:33   Dg Chest Port 1 View  02/04/2016  CLINICAL DATA:  Acute respiratory failure. EXAM: PORTABLE CHEST 1 VIEW COMPARISON:  02/04/2016 at 03:54 FINDINGS: The endotracheal tube is 2.7 cm above the carina. The nasogastric tube extends into the stomach. Vascular and interstitial prominence persists. Basilar opacities and effusions persist without significant interval change. IMPRESSION: Support equipment appears satisfactorily positioned. No significant interval change in the bilateral airspace opacities. Electronically Signed   By: Ellery Plunkaniel R Mitchell M.D.   On: 02/04/2016 22:58    Dg Chest Port 1 View  02/04/2016  CLINICAL DATA:  Respiratory failure. EXAM: PORTABLE CHEST 1 VIEW COMPARISON:  Yesterday. FINDINGS: Endotracheal tube tip 1 cm above the carina. Nasogastric tube extending into the stomach. Decreased inspiration with crowding of the lung markings. Stable enlarged cardiac silhouette and prominent pulmonary vasculature and interstitial markings. Diffuse osteopenia. IMPRESSION: Stable cardiomegaly, pulmonary vascular congestion and chronic interstitial lung disease with possible mild superimposed interstitial pulmonary edema. Electronically Signed   By: Beckie SaltsSteven  Reid M.D.   On: 02/04/2016 07:32     Assessment/Plan:  82 y/o F. With RMCA stroke likely in setting of R M1 occlusion.   - Poor prognosis for meaningful recovery - Likely will herniate and stroke out her RMCA followed by L MCA due to edema - family meetings yesterday. Pt is now DNR. They clearly did state that pt would not want trach and PEG.  - repeat CTH today - mannitol 25 q6 started with goal OSM <320. Osm q6 Last one 294.  - No central Line. Holding off 3%, but will not change outcome    02/05/2016, 1:53 PM

## 2016-02-05 NOTE — Progress Notes (Signed)
1800 No significant changes. Still questionable responses and orientation. Dr. Mariah MillingGollan showed daughter head CT scan of patient. Explained details to daughter and prognosis.

## 2016-02-06 ENCOUNTER — Telehealth: Payer: Self-pay | Admitting: Cardiovascular Disease

## 2016-02-06 ENCOUNTER — Inpatient Hospital Stay: Payer: Medicare Other

## 2016-02-06 DIAGNOSIS — I63311 Cerebral infarction due to thrombosis of right middle cerebral artery: Secondary | ICD-10-CM

## 2016-02-06 LAB — CBC
HEMATOCRIT: 24 % — AB (ref 35.0–47.0)
Hemoglobin: 7.6 g/dL — ABNORMAL LOW (ref 12.0–16.0)
MCH: 27.1 pg (ref 26.0–34.0)
MCHC: 31.7 g/dL — AB (ref 32.0–36.0)
MCV: 85.4 fL (ref 80.0–100.0)
PLATELETS: 266 10*3/uL (ref 150–440)
RBC: 2.81 MIL/uL — ABNORMAL LOW (ref 3.80–5.20)
RDW: 24.1 % — AB (ref 11.5–14.5)
WBC: 10.3 10*3/uL (ref 3.6–11.0)

## 2016-02-06 LAB — OSMOLALITY
OSMOLALITY: 294 mosm/kg (ref 275–295)
OSMOLALITY: 290 mosm/kg (ref 275–295)

## 2016-02-06 LAB — GLUCOSE, CAPILLARY
GLUCOSE-CAPILLARY: 226 mg/dL — AB (ref 65–99)
GLUCOSE-CAPILLARY: 216 mg/dL — AB (ref 65–99)
GLUCOSE-CAPILLARY: 189 mg/dL — AB (ref 65–99)
GLUCOSE-CAPILLARY: 236 mg/dL — AB (ref 65–99)

## 2016-02-06 LAB — BLOOD GAS, ARTERIAL
Acid-Base Excess: 8.4 mmol/L — ABNORMAL HIGH (ref 0.0–3.0)
Bicarbonate: 34.7 mEq/L — ABNORMAL HIGH (ref 21.0–28.0)
FIO2: 0.4
MECHANICAL RATE: 16
O2 SAT: 98.6 %
PCO2 ART: 60 mmHg — AB (ref 32.0–48.0)
PEEP: 5 cmH2O
Patient temperature: 37
VT: 500 mL
pH, Arterial: 7.37 (ref 7.350–7.450)
pO2, Arterial: 121 mmHg — ABNORMAL HIGH (ref 83.0–108.0)

## 2016-02-06 LAB — BASIC METABOLIC PANEL
ANION GAP: 6 (ref 5–15)
BUN: 26 mg/dL — ABNORMAL HIGH (ref 6–20)
CALCIUM: 7.8 mg/dL — AB (ref 8.9–10.3)
CO2: 32 mmol/L (ref 22–32)
CREATININE: 0.55 mg/dL (ref 0.44–1.00)
Chloride: 93 mmol/L — ABNORMAL LOW (ref 101–111)
GFR calc Af Amer: >60 mL/min (ref >60)
GLUCOSE: 217 mg/dL — AB (ref 65–99)
Potassium: 4.3 mmol/L (ref 3.5–5.1)
Sodium: 131 mmol/L — ABNORMAL LOW (ref 135–145)

## 2016-02-06 MED ORDER — SENNOSIDES 8.8 MG/5ML PO SYRP
5.0000 mL | ORAL_SOLUTION | Freq: Two times a day (BID) | ORAL | Status: DC
  Administered 2016-02-06: 5 mL via ORAL
  Filled 2016-02-06: qty 5

## 2016-02-06 MED ORDER — VITAL HIGH PROTEIN PO LIQD
1000.0000 mL | ORAL | Status: DC
Start: 2016-02-06 — End: 2016-02-06

## 2016-02-06 MED ORDER — ONDANSETRON 4 MG PO TBDP
4.0000 mg | ORAL_TABLET | Freq: Four times a day (QID) | ORAL | Status: DC | PRN
  Filled 2016-02-06: qty 1

## 2016-02-06 MED ORDER — GLYCOPYRROLATE 1 MG PO TABS: 1.0000 mg | ORAL_TABLET | ORAL | Status: DC | PRN

## 2016-02-06 MED ORDER — ACETAMINOPHEN 325 MG PO TABS: 650.0000 mg | ORAL_TABLET | Freq: Four times a day (QID) | ORAL | Status: DC | PRN

## 2016-02-06 MED ORDER — MORPHINE BOLUS VIA INFUSION
2.0000 mg | INTRAVENOUS | Status: DC | PRN
  Administered 2016-02-06 (×2): 2 mg via INTRAVENOUS
  Filled 2016-02-06 (×3): qty 2

## 2016-02-06 MED ORDER — DOCUSATE SODIUM 50 MG/5ML PO LIQD
100.0000 mg | Freq: Two times a day (BID) | ORAL | Status: DC
  Administered 2016-02-06: 100 mg via ORAL
  Filled 2016-02-06: qty 10

## 2016-02-06 MED ORDER — HALOPERIDOL LACTATE 2 MG/ML PO CONC: 0.5000 mg | ORAL | Status: DC | PRN

## 2016-02-06 MED ORDER — GLYCOPYRROLATE 0.2 MG/ML IJ SOLN: 0.2000 mg | INTRAMUSCULAR | Status: DC | PRN

## 2016-02-06 MED ORDER — ONDANSETRON HCL 4 MG/2ML IJ SOLN: 4.0000 mg | Freq: Four times a day (QID) | INTRAMUSCULAR | Status: DC | PRN

## 2016-02-06 MED ORDER — HALOPERIDOL 0.5 MG PO TABS: 0.5000 mg | ORAL_TABLET | ORAL | Status: DC | PRN

## 2016-02-06 MED ORDER — HALOPERIDOL LACTATE 5 MG/ML IJ SOLN: 0.5000 mg | INTRAMUSCULAR | Status: DC | PRN

## 2016-02-06 MED ORDER — INSULIN ASPART 100 UNIT/ML ~~LOC~~ SOLN
3.0000 [IU] | SUBCUTANEOUS | Status: DC
  Administered 2016-02-06 (×2): 3 [IU] via SUBCUTANEOUS

## 2016-02-06 MED ORDER — ACETAMINOPHEN 650 MG RE SUPP: 650.0000 mg | Freq: Four times a day (QID) | RECTAL | Status: DC | PRN

## 2016-02-06 MED ORDER — GLYCOPYRROLATE 0.2 MG/ML IJ SOLN
0.2000 mg | INTRAMUSCULAR | Status: DC | PRN
  Administered 2016-02-06: 0.2 mg via INTRAVENOUS
  Filled 2016-02-06: qty 1

## 2016-02-06 MED ORDER — MORPHINE SULFATE 25 MG/ML IV SOLN
10.0000 mg/h | INTRAVENOUS | Status: DC
  Administered 2016-02-06: 5 mg/h via INTRAVENOUS
  Filled 2016-02-06: qty 10

## 2016-02-06 MED FILL — Medication: Qty: 1 | Status: AC

## 2016-02-06 NOTE — Telephone Encounter (Signed)
Pt daughter calling asking if we can call her back It is in regards to patient being in ICU, she had a massive stroke.  Please call back.

## 2016-02-06 NOTE — Progress Notes (Signed)
Patient extubated to comfort care measures at 18:30. Patient on 2 L nasal cannula per orders. PRN medication given to allieviate work of breathing and secretions. Family at bedside with Chaplain and Dr. Mariah MillingGollan. Due to increased work of breathing, Dr. Mariah MillingGollan increased morphine infusion dose to 10 mL/hr.

## 2016-02-06 NOTE — Progress Notes (Signed)
    1645  Clinical Encounter Type  Visited With Patient  Visit Type Follow-up  Referral From Nurse  Consult/Referral To Chaplain  Spiritual Encounters  Spiritual Needs Prayer;Ritual;Emotional;Grief support  Stress Factors  Patient Stress Factors Other (Comment) (Patient extabated and is actively dying.)  Family Stress Factors Exhausted;Major life changes  Called to provide grief counsel and comfort to family and Sacrament of extreme unction to patient.  Chap. Sophi Calligan G. Wrigley Plasencia, ext. [phone]

## 2016-02-06 NOTE — Care Management (Signed)
[facility] will accept patient back however I understand that patient did not like it. Referral to [facility] and [facility] for [facility]AC review. Patient is here from [facility].

## 2016-02-06 NOTE — Progress Notes (Signed)
Nutrition Follow-up     INTERVENTION:  EN: recommend changing Vital High Protein to goal of 50 ml/hr, calories from D5-mannitol taken into account, needs reassess. Meets 100% calorie and protein needs via ASPEN. No BM in 4 days, Discussed addition of bowel regimen in ICU rounds. Continue to assess  NUTRITION DIAGNOSIS:   Inadequate oral intake related to acute illness as evidenced by NPO status. Being addressed via TF  GOAL:   Provide needs based on ASPEN/SCCM guidelines  MONITOR:   Labs, TF tolerance, Vent status  REASON FOR ASSESSMENT:   Consult Enteral/tube feeding initiation and management  ASSESSMENT:   82 y/o female admitted with massive CVA, acute respiratory failure on vent, with possible pneumonia.  Pt showing sing of possible worsening edema on neuro exam per MD, extensive neurological injury from infarct  Patient is currently intubated on ventilator support MV:  7.7 L/min Temp (24hrs), Avg:100.3 F (37.9 C), Min:99.7 F (37.6 C), Max:100.8 F (38.2 C)  EN: Vita High Protein at 55 ml/hr  Skin:   (stage 1 pressure ulcer noted)  Last BM:  4/13   Labs: sodium 131  Glucose Profile:  Recent Labs   0337  0759  1113  GLUCAP 226* 216* 189*   Meds: mannitol in D5 (500 mL QID providing 340 kcals), LR at 50 ml/hr, ss novolog  Height:   Ht Readings from Last 1 Encounters:  01-16-16 5\' 1"  (1.549 m)    Weight:   Wt Readings from Last 1 Encounters:  02/05/16 168 lb 14 oz (76.6 kg)    BMI:  Body mass index is 31.92 kg/(m^2).  Estimated Nutritional Needs:   Kcal:  1308 kcals/d (Ve 7, Tmax 37.4)  Protein:  86-144 g/d  Fluid:  >2 L/d  EDUCATION NEEDS:   No education needs identified at this time  Romelle StarcherCate Juleah Paradise MS, RD, LDN [phone] Pager  [phone] Weekend/On-Call Pager

## 2016-02-06 NOTE — Progress Notes (Signed)
[place] NOTE  Pharmacy Consult for Constipation Prevention    Allergies  Allergen Reactions  . Singlet [Chlorphen-Pe-Acetaminophen] Shortness Of Breath  . Atorvastatin Other (See Comments)    Reaction:  Unknown   . Clindamycin Diarrhea and Nausea And Vomiting  . Codeine Other (See Comments)    Reaction:  Unknown   . Doxycycline Hives and Itching  . Epinephrine Hives and Itching  . Erythromycin Hives and Itching  . Fluticasone-Salmeterol Other (See Comments)    Reaction:  Unknown   . Lasix [Furosemide] Itching  . Levofloxacin Other (See Comments)    Reaction:  Unknown   . Penicillins Other (See Comments)    Reaction:  Unknown   . Propoxyphene Hcl Other (See Comments)    Reaction:  Unknown   . Rosuvastatin Other (See Comments)    Reaction:  Unknown   . Simvastatin Other (See Comments)    Reaction:  Unknown   . Sulfamethoxazole-Trimethoprim Other (See Comments)    Reaction:  Unknown   . Teriparatide Other (See Comments)    Reaction:  Unknown   . Tetracyclines & Related Hives and Itching  . Ceclor [Cefaclor] Hives and Other (See Comments)    Reaction:  Altered mental status    . Glipizide Hives and Itching  . Lisinopril Hives and Itching    Patient Measurements: Height: _0  (154.9 cm) Weight: 168 lb 14 oz (76.6 kg) IBW/kg (Calculated) : 47.8   Vital Signs: Temp: 99.7 F (37.6 C) (04/17 1100) Temp Source: Core (Comment) (04/17 1100) BP: 140/52 mmHg (04/17 1100) Pulse Rate: 70 (04/17 1100) Intake/Output from previous day: 04/16 0701 - 04/17 0700 In: 5780.8 [I.V.:2130.8; NG/GT:1450; IV Piggyback:2200] Out: 7035 [Urine:1735] Intake/Output from this shift: Total I/O In: 145 [I.V.:90; NG/GT:55] Out: 650 [Urine:650] Vent settings for last 24 hours: Vent Mode:  [-] PRVC FiO2 (%):  [30 %-40 %] 30 % Set Rate:  [16 bmp] 16 bmp Vt Set:  [500 mL] 500 mL PEEP:  [5 cmH20] 5 cmH20 Plateau Pressure:  [19 cmH20] 19 cmH20  Labs:  Recent  Labs  02/04/16 0517 02/05/16 0539  0504  WBC 17.8* 14.1* 10.3  HGB 8.3* 7.7* 7.6*  HCT 26.1* 24.4* 24.0*  PLT 270 288 266  CREATININE 0.91 0.68 0.55  MG 1.6*  --   --   PHOS 1.8*  --   --    Estimated Creatinine Clearance: 48.1 mL/min (by C-G formula based on Cr of 0.55).   Recent Labs   0337  0759  1113  GLUCAP 226* 216* 189*    Microbiology: Recent Results (from the past 720 hour(s))  Blood culture (routine x 2)     Status: None   Collection Time: 01/10/16  3:26 PM  Result Value Ref Range Status   Specimen Description BLOOD RIGHT WRIST  Final   Special Requests BOTTLES DRAWN AEROBIC AND ANAEROBIC 5ML  Final   Culture NO GROWTH 23 DAYS  Final   Report Status 02/02/2016 FINAL  Final  Blood culture (routine x 2)     Status: None   Collection Time: 01/10/16  3:26 PM  Result Value Ref Range Status   Specimen Description BLOOD LEFT HAND  Final   Special Requests BOTTLES DRAWN AEROBIC AND ANAEROBIC 5ML  Final   Culture NO GROWTH 23 DAYS  Final   Report Status 02/02/2016 FINAL  Final  MRSA PCR Screening     Status: None   Collection Time: 01/10/16  9:48 PM  Result Value Ref  Range Status   MRSA by PCR NEGATIVE NEGATIVE Final    Comment:        The GeneXpert MRSA Assay (FDA approved for NASAL specimens only), is one component of a comprehensive MRSA colonization surveillance program. It is not intended to diagnose MRSA infection nor to guide or monitor treatment for MRSA infections.   Blood Culture (routine x 2)     Status: None (Preliminary result)   Collection Time: 01/23/2016  8:30 AM  Result Value Ref Range Status   Specimen Description BLOOD LEFT ARM  Final   Special Requests   Final    BOTTLES DRAWN AEROBIC AND ANAEROBIC  AER 3CC ANA 6CC   Culture  Setup Time   Final    GRAM POSITIVE COCCI AEROBIC BOTTLE ONLY CRITICAL RESULT CALLED TO, READ BACK BY AND VERIFIED WITH: Marcelle Overlie @ 0820 02/04/16 by [facility]    Culture   Final     COAGULASE NEGATIVE STAPHYLOCOCCUS AEROBIC BOTTLE ONLY Results consistent with contamination.    Report Status PENDING  Incomplete  Blood Culture ID Panel (Reflexed)     Status: Abnormal   Collection Time: 02/04/2016  8:30 AM  Result Value Ref Range Status   Enterococcus species NOT DETECTED NOT DETECTED Final   Vancomycin resistance NOT DETECTED NOT DETECTED Final   Listeria monocytogenes NOT DETECTED NOT DETECTED Final   Staphylococcus species DETECTED (A) NOT DETECTED Final    Comment: CRITICAL RESULT CALLED TO, READ BACK BY AND VERIFIED WITH: Melissa Maccia @ 0820 02/04/16 [place]    Staphylococcus aureus NOT DETECTED NOT DETECTED Final   Methicillin resistance DETECTED (A) NOT DETECTED Final    Comment: CRITICAL RESULT CALLED TO, READ BACK BY AND VERIFIED WITH: Marcelle Overlie @ 0820 02/04/16 [place]    Streptococcus species NOT DETECTED NOT DETECTED Final   Streptococcus agalactiae NOT DETECTED NOT DETECTED Final   Streptococcus pneumoniae NOT DETECTED NOT DETECTED Final   Streptococcus pyogenes NOT DETECTED NOT DETECTED Final   Acinetobacter baumannii NOT DETECTED NOT DETECTED Final   Enterobacteriaceae species NOT DETECTED NOT DETECTED Final   Enterobacter cloacae complex NOT DETECTED NOT DETECTED Final   Escherichia coli NOT DETECTED NOT DETECTED Final   Klebsiella oxytoca NOT DETECTED NOT DETECTED Final   Klebsiella pneumoniae NOT DETECTED NOT DETECTED Final   Proteus species NOT DETECTED NOT DETECTED Final   Serratia marcescens NOT DETECTED NOT DETECTED Final   Carbapenem resistance NOT DETECTED NOT DETECTED Final   Haemophilus influenzae NOT DETECTED NOT DETECTED Final   Neisseria meningitidis NOT DETECTED NOT DETECTED Final   Pseudomonas aeruginosa NOT DETECTED NOT DETECTED Final   Candida albicans NOT DETECTED NOT DETECTED Final   Candida glabrata NOT DETECTED NOT DETECTED Final   Candida krusei NOT DETECTED NOT DETECTED Final   Candida parapsilosis NOT DETECTED NOT  DETECTED Final   Candida tropicalis NOT DETECTED NOT DETECTED Final  Blood Culture (routine x 2)     Status: None (Preliminary result)   Collection Time: 02/05/2016  8:35 AM  Result Value Ref Range Status   Specimen Description BLOOD RIGHT ARM  Final   Special Requests BOTTLES DRAWN AEROBIC ONLY  3CC  Final   Culture NO GROWTH 3 DAYS  Final   Report Status PENDING  Incomplete  Urine culture     Status: Abnormal   Collection Time: 01/27/2016  8:35 AM  Result Value Ref Range Status   Specimen Description URINE, RANDOM  Final   Special Requests NONE  Final   Culture >=100,000  COLONIES/mL PROTEUS MIRABILIS (A)  Final   Report Status 02/05/2016 FINAL  Final   Organism ID, Bacteria PROTEUS MIRABILIS (A)  Final      Susceptibility   Proteus mirabilis - MIC*    AMPICILLIN <=2 SENSITIVE Sensitive     CEFAZOLIN <=4 SENSITIVE Sensitive     CEFTRIAXONE <=1 SENSITIVE Sensitive     CIPROFLOXACIN <=0.25 SENSITIVE Sensitive     GENTAMICIN <=1 SENSITIVE Sensitive     IMIPENEM 2 SENSITIVE Sensitive     NITROFURANTOIN 128 RESISTANT Resistant     TRIMETH/SULFA <=20 SENSITIVE Sensitive     AMPICILLIN/SULBACTAM <=2 SENSITIVE Sensitive     PIP/TAZO <=4 SENSITIVE Sensitive     * >=100,000 COLONIES/mL PROTEUS MIRABILIS  MRSA PCR Screening     Status: None   Collection Time: 02/02/2016  2:24 PM  Result Value Ref Range Status   MRSA by PCR NEGATIVE NEGATIVE Final    Comment:        The GeneXpert MRSA Assay (FDA approved for NASAL specimens only), is one component of a comprehensive MRSA colonization surveillance program. It is not intended to diagnose MRSA infection nor to guide or monitor treatment for MRSA infections.   Culture, expectorated sputum-assessment     Status: None   Collection Time: 02/04/16 11:13 AM  Result Value Ref Range Status   Specimen Description EXPECTORATED SPUTUM  Final   Special Requests NONE  Final   Sputum evaluation THIS SPECIMEN IS ACCEPTABLE FOR SPUTUM CULTURE  Final    Report Status 02/04/2016 FINAL  Final  Culture, respiratory (NON-Expectorated)     Status: None (Preliminary result)   Collection Time: 02/04/16 11:13 AM  Result Value Ref Range Status   Specimen Description EXPECTORATED SPUTUM  Final   Special Requests NONE Reflexed from [id number]  Final   Gram Stain   Final    MODERATE WBC SEEN FEW YEAST RARE GRAM NEGATIVE RODS    Culture HOLDING FOR POSSIBLE PATHOGEN  Final   Report Status PENDING  Incomplete    Medications:  Scheduled:  . amiodarone  200 mg Oral BID  . antiseptic oral rinse  7 mL Mouth Rinse QID  . budesonide (PULMICORT) nebulizer solution  0.25 mg Nebulization 4 times per day  . chlorhexidine gluconate (SAGE KIT)  15 mL Mouth Rinse BID  . sennosides  5 mL Oral BID   And  . docusate  100 mg Oral BID  . feeding supplement (VITAL HIGH PROTEIN)  1,000 mL Per Tube Q24H  . free water  100 mL Per Tube 3 times per day  . heparin  5,000 Units Subcutaneous 3 times per day  . hydrocortisone sod succinate (SOLU-CORTEF) inj  25 mg Intravenous Q12H  . insulin aspart  0-20 Units Subcutaneous 6 times per day  . insulin aspart  3 Units Subcutaneous 6 times per day  . ipratropium-albuterol  3 mL Nebulization Q6H  . mannitol 25 grams in dextrose 5%  25 g/hr Intravenous QID  . pantoprazole (PROTONIX) IV  40 mg Intravenous QHS   Infusions:  . fentaNYL infusion INTRAVENOUS Stopped ( 0920)  . lactated ringers 50 mL/hr at  0800    Assessment: Pharmacy consulted to assist with constipation prevention in this 82 y/o F currently intubated and off sedation.   Plan:  Patient with last BM charted 4/13. Will begin senna-docusate bid and f/u AM.   Ulice Dash D ,12:09 PM

## 2016-02-06 NOTE — Progress Notes (Signed)
Pt appears comfortable, work of breath controlled with morphine drip, no distress noted, will continue to monitor pt

## 2016-02-06 NOTE — Telephone Encounter (Addendum)
I spent a great deal of the afternoon w/ Catherine Holmes.  Her father and a family friend were present w/ her, but she struggled w/ decision making. Dr. Mariah MillingGollan called and spoke w/ Catherine Holmes [place] to answer any questions.

## 2016-02-06 NOTE — Progress Notes (Signed)
Time of death 2125,Dr. Arsenio LoaderSommer notified, daughter notified and states she will be here soon, chaplain called and CDS notified

## 2016-02-06 NOTE — Progress Notes (Signed)
  I have had extensive discussions with  Catherine Holmes ( Daughter).  Daughter decided to make the patient Ms Catherine Holmes  ,a comfort care due to the poor prognosis of the patient.  Catherine Holmes was also called and had discussion with the family member. Daughter has agreed on making her mother as comfortable as possible as she will pass upon the withdrawing the ventilatory support.  Extubation orders are placed.  Patient will be started on Morphine drip.  All the decision were made with the care nurse and charge nurse is in the loop.    Catherine Holmes,AG-ACNP Pulmonary & Critical Care

## 2016-02-06 NOTE — Progress Notes (Signed)
Subjective: Patient with sedation discontinued as of 0920 this morning.  Remains intubated.    Objective: Current vital signs: BP 145/55 mmHg  Pulse 71  Temp(Src) 99.9 F (37.7 C) (Core (Comment))  Resp 16  Ht 5' 1"  (1.549 m)  Wt 76.6 kg (168 lb 14 oz)  BMI 31.92 kg/m2  SpO2 98% Vital signs in last 24 hours: Temp:  [99.9 F (37.7 C)-101.1 F (38.4 C)] 99.9 F (37.7 C) (04/17 0950) Pulse Rate:  [62-80] 71 (04/17 0950) Resp:  [16-18] 16 (04/17 0950) BP: (88-162)/(42-66) 145/55 mmHg (04/17 0800) SpO2:  [98 %-100 %] 98 % (04/17 0950) FiO2 (%):  [30 %-40 %] 30 % (04/17 0922)  Intake/Output from previous day: 04/16 0701 - 04/17 0700 In: 5780.8 [I.V.:2130.8; NG/GT:1450; IV Piggyback:2200] Out: 1735 [Urine:1735] Intake/Output this shift: Total I/O In: 145 [I.V.:90; NG/GT:55] Out: 325 [Urine:325] Nutritional status:    Neurologic Exam: Mental Status: Patient does not respond to verbal stimuli.  With deep sternal rub shows evidence of decerebrate posturing with only the LUE not involved.  Does not follow commands.  No verbalizations noted.  Cranial Nerves: II: patient does not respond confrontation bilaterally, pupils right 3 mm, left 3 mm,and reactive bilaterally.  Pupils irregular III,IV,VI: doll's response present bilaterally. V,VII: corneal reflex present bilaterally  VIII: patient does not respond to verbal stimuli IX,X: gag reflex reduced, XI: trapezius strength unable to test bilaterally XII: tongue strength unable to test Motor: Extremities flaccid throughout.  No movement noted in the LUE.  Posturing noted in the RUE and both lower extremities.   Sensory: Does not respond to noxious stimuli in any extremity. Deep Tendon Reflexes:  Absent throughout. Plantars: upgoing bilaterally Cerebellar: Unable to perform    Lab Results: Basic Metabolic Panel:  Recent Labs Lab 02/11/2016 0835 02/04/16 0517 02/05/16 0539  0504  NA 135 133* 131* 131*  K 5.3*  3.6 4.0 4.3  CL 89* 93* 94* 93*  CO2 36* 33* 32 32  GLUCOSE 232* 238* 230* 217*  BUN 39* 40* 37* 26*  CREATININE 1.62* 0.91 0.68 0.55  CALCIUM 8.7* 7.9* 7.8* 7.8*  MG  --  1.6*  --   --   PHOS  --  1.8*  --   --     Liver Function Tests:  Recent Labs Lab 02/05/2016 0835  AST 223*  ALT 187*  ALKPHOS 101  BILITOT 0.8  PROT 6.7  ALBUMIN 3.6    Recent Labs Lab 02/02/2016 0835  LIPASE <10*   No results for input(s): AMMONIA in the last 168 hours.  CBC:  Recent Labs Lab 02/11/2016 0835 02/04/16 0517 02/05/16 0539  0504  WBC 20.0* 17.8* 14.1* 10.3  NEUTROABS 17.9*  --   --   --   HGB 10.5* 8.3* 7.7* 7.6*  HCT 31.7* 26.1* 24.4* 24.0*  MCV 84.5 84.4 84.6 85.4  PLT 339 270 288 266    Cardiac Enzymes:  Recent Labs Lab 01/21/2016 0835 01/23/2016 1643 01/29/2016 2238  TROPONINI 0.46* 0.41* 0.40*    Lipid Panel: No results for input(s): CHOL, TRIG, HDL, CHOLHDL, VLDL, LDLCALC in the last 168 hours.  CBG:  Recent Labs Lab 02/05/16 1157 02/05/16 1959 02/05/16 2341  0337  0759  GLUCAP 161* 306* 186* 226* 216*    Microbiology: Results for orders placed or performed during the hospital encounter of 02/09/2016  Blood Culture (routine x 2)     Status: None (Preliminary result)   Collection Time: 01/27/2016  8:30 AM  Result Value Ref  Range Status   Specimen Description BLOOD LEFT ARM  Final   Special Requests   Final    BOTTLES DRAWN AEROBIC AND ANAEROBIC  AER 3CC ANA 6CC   Culture  Setup Time   Final    GRAM POSITIVE COCCI AEROBIC BOTTLE ONLY CRITICAL RESULT CALLED TO, READ BACK BY AND VERIFIED WITH: Marcelle Overlie @ 0820 02/04/16 by [facility]    Culture   Final    COAGULASE NEGATIVE STAPHYLOCOCCUS AEROBIC BOTTLE ONLY Results consistent with contamination.    Report Status PENDING  Incomplete  Blood Culture ID Panel (Reflexed)     Status: Abnormal   Collection Time: 01/25/2016  8:30 AM  Result Value Ref Range Status   Enterococcus species NOT  DETECTED NOT DETECTED Final   Vancomycin resistance NOT DETECTED NOT DETECTED Final   Listeria monocytogenes NOT DETECTED NOT DETECTED Final   Staphylococcus species DETECTED (A) NOT DETECTED Final    Comment: CRITICAL RESULT CALLED TO, READ BACK BY AND VERIFIED WITH: Melissa Maccia @ 0820 02/04/16 [place]    Staphylococcus aureus NOT DETECTED NOT DETECTED Final   Methicillin resistance DETECTED (A) NOT DETECTED Final    Comment: CRITICAL RESULT CALLED TO, READ BACK BY AND VERIFIED WITH: Marcelle Overlie @ 0820 02/04/16 [place]    Streptococcus species NOT DETECTED NOT DETECTED Final   Streptococcus agalactiae NOT DETECTED NOT DETECTED Final   Streptococcus pneumoniae NOT DETECTED NOT DETECTED Final   Streptococcus pyogenes NOT DETECTED NOT DETECTED Final   Acinetobacter baumannii NOT DETECTED NOT DETECTED Final   Enterobacteriaceae species NOT DETECTED NOT DETECTED Final   Enterobacter cloacae complex NOT DETECTED NOT DETECTED Final   Escherichia coli NOT DETECTED NOT DETECTED Final   Klebsiella oxytoca NOT DETECTED NOT DETECTED Final   Klebsiella pneumoniae NOT DETECTED NOT DETECTED Final   Proteus species NOT DETECTED NOT DETECTED Final   Serratia marcescens NOT DETECTED NOT DETECTED Final   Carbapenem resistance NOT DETECTED NOT DETECTED Final   Haemophilus influenzae NOT DETECTED NOT DETECTED Final   Neisseria meningitidis NOT DETECTED NOT DETECTED Final   Pseudomonas aeruginosa NOT DETECTED NOT DETECTED Final   Candida albicans NOT DETECTED NOT DETECTED Final   Candida glabrata NOT DETECTED NOT DETECTED Final   Candida krusei NOT DETECTED NOT DETECTED Final   Candida parapsilosis NOT DETECTED NOT DETECTED Final   Candida tropicalis NOT DETECTED NOT DETECTED Final  Blood Culture (routine x 2)     Status: None (Preliminary result)   Collection Time: 02/04/2016  8:35 AM  Result Value Ref Range Status   Specimen Description BLOOD RIGHT ARM  Final   Special Requests BOTTLES DRAWN AEROBIC ONLY   3CC  Final   Culture NO GROWTH 2 DAYS  Final   Report Status PENDING  Incomplete  Urine culture     Status: Abnormal   Collection Time: 02/04/2016  8:35 AM  Result Value Ref Range Status   Specimen Description URINE, RANDOM  Final   Special Requests NONE  Final   Culture >=100,000 COLONIES/mL PROTEUS MIRABILIS (A)  Final   Report Status 02/05/2016 FINAL  Final   Organism ID, Bacteria PROTEUS MIRABILIS (A)  Final      Susceptibility   Proteus mirabilis - MIC*    AMPICILLIN <=2 SENSITIVE Sensitive     CEFAZOLIN <=4 SENSITIVE Sensitive     CEFTRIAXONE <=1 SENSITIVE Sensitive     CIPROFLOXACIN <=0.25 SENSITIVE Sensitive     GENTAMICIN <=1 SENSITIVE Sensitive     IMIPENEM 2 SENSITIVE Sensitive  NITROFURANTOIN 128 RESISTANT Resistant     TRIMETH/SULFA <=20 SENSITIVE Sensitive     AMPICILLIN/SULBACTAM <=2 SENSITIVE Sensitive     PIP/TAZO <=4 SENSITIVE Sensitive     * >=100,000 COLONIES/mL PROTEUS MIRABILIS  MRSA PCR Screening     Status: None   Collection Time: 02/09/2016  2:24 PM  Result Value Ref Range Status   MRSA by PCR NEGATIVE NEGATIVE Final    Comment:        The GeneXpert MRSA Assay (FDA approved for NASAL specimens only), is one component of a comprehensive MRSA colonization surveillance program. It is not intended to diagnose MRSA infection nor to guide or monitor treatment for MRSA infections.   Culture, expectorated sputum-assessment     Status: None   Collection Time: 02/04/16 11:13 AM  Result Value Ref Range Status   Specimen Description EXPECTORATED SPUTUM  Final   Special Requests NONE  Final   Sputum evaluation THIS SPECIMEN IS ACCEPTABLE FOR SPUTUM CULTURE  Final   Report Status 02/04/2016 FINAL  Final  Culture, respiratory (NON-Expectorated)     Status: None (Preliminary result)   Collection Time: 02/04/16 11:13 AM  Result Value Ref Range Status   Specimen Description EXPECTORATED SPUTUM  Final   Special Requests NONE Reflexed from [id number]  Final   Gram  Stain   Final    MODERATE WBC SEEN FEW YEAST RARE GRAM NEGATIVE RODS    Culture HOLDING FOR POSSIBLE PATHOGEN  Final   Report Status PENDING  Incomplete    Coagulation Studies: No results for input(s): LABPROT, INR in the last 72 hours.  Imaging: Dg Chest 1 View    CLINICAL DATA:  Dyspnea, respiratory arrest, CHF, diabetes, COPD. EXAM: CHEST 1 VIEW COMPARISON:  Portable chest x-ray dated February 05, 2016. FINDINGS: The lungs are at areae interstitial markings remain increased bilaterally. There are moderate-sized bilateral pleural effusions. The cardiac silhouette remains enlarged the pulmonary vascularity remains engorged. The endotracheal tube tip lies 1.7 cm above the carina. The esophagogastric tube tip projects below the inferior margin of the image. IMPRESSION: CHF with pulmonary interstitial edema and bilateral pleural effusions superimposed upon COPD. No discrete pneumonia. No pneumothorax. No significant change since yesterday's study. The endotracheal tube tip lies 1.7 cm above the carina. Consideration for withdrawal by approximately 1 cm would be useful to help avoid accidental mainstem bronchus intubation with patient movement. Electronically Signed   By: David  Martinique M.D.   On:  07:49   Dg Chest 1 View  02/05/2016  CLINICAL DATA:  82 year old female with history of dyspnea. Hypertension. COPD. EXAM: CHEST 1 VIEW COMPARISON:  Chest x-ray 02/04/2016. FINDINGS: A nasogastric tube is seen extending into the stomach, however, the tip of the nasogastric tube extends below the lower margin of the image. Lung volumes are normal. Bibasilar opacities may reflect areas of atelectasis and/or airspace consolidation with superimposed small bilateral pleural effusions. Peribronchial cuffing and diffuse interstitial prominence throughout the lungs bilaterally. No cephalization of the pulmonary vasculature. Mild cardiomegaly. Atherosclerosis in the thoracic aorta. IMPRESSION: 1.  Support apparatus, as above. 2. Diffuse peribronchial cuffing and interstitial prominence, concerning for an acute bronchitis. 3. Bibasilar areas of atelectasis and/or airspace consolidation with superimposed small bilateral pleural effusions. Electronically Signed   By: Vinnie Langton M.D.   On: 02/05/2016 08:33   Dg Chest Port 1 View  02/04/2016  CLINICAL DATA:  Acute respiratory failure. EXAM: PORTABLE CHEST 1 VIEW COMPARISON:  02/04/2016 at 03:54 FINDINGS: The endotracheal tube is 2.7 cm above the  carina. The nasogastric tube extends into the stomach. Vascular and interstitial prominence persists. Basilar opacities and effusions persist without significant interval change. IMPRESSION: Support equipment appears satisfactorily positioned. No significant interval change in the bilateral airspace opacities. Electronically Signed   By: Andreas Newport M.D.   On: 02/04/2016 22:58    Medications:  I have reviewed the patient's current medications. Scheduled: . amiodarone  200 mg Oral BID  . antiseptic oral rinse  7 mL Mouth Rinse QID  . budesonide (PULMICORT) nebulizer solution  0.25 mg Nebulization 4 times per day  . ceFEPime (MAXIPIME) IV  2 g Intravenous Q24H  . chlorhexidine gluconate (SAGE KIT)  15 mL Mouth Rinse BID  . feeding supplement (VITAL HIGH PROTEIN)  1,000 mL Per Tube Q24H  . free water  100 mL Per Tube 3 times per day  . heparin  5,000 Units Subcutaneous 3 times per day  . hydrocortisone sod succinate (SOLU-CORTEF) inj  25 mg Intravenous Q12H  . insulin aspart  0-20 Units Subcutaneous 6 times per day  . ipratropium-albuterol  3 mL Nebulization Q6H  . mannitol 25 grams in dextrose 5%  25 g/hr Intravenous QID  . pantoprazole (PROTONIX) IV  40 mg Intravenous QHS  . vancomycin  750 mg Intravenous Q36H    Assessment/Plan: Patient s/p large right MCA infarct.  Patient showing some signs on neurological examination of possible worsening edema.  Suspect extensive neurological  injury from infarct.  Patient remains on Mannitol.  Will repeat imaging and make suggestions concerning its continuation after review.    Recommendations: 1.  Repeat head CT today.     LOS: 3 days   Alexis Goodell, MD Neurology [phone]   10:14 AM

## 2016-02-06 NOTE — Progress Notes (Signed)
PULMONARY / CRITICAL CARE MEDICINE   Name: Catherine Holmes MRN: [id number] DOB: [birth year]    ADMISSION DATE:  03/04/16  PT PROFILE:   82 F with COPD, CHF recently hospitalized @ [facility] 03/21 - 03/30 for acute on chronic respiratory failure. She was transferred to [facility], then SNF for rehab. On the morning of this admission, she was found by SNF personnel agonal and unresponsive and transported to ED where she was intubated. CT head reveals massive R MCA territory CVA with cytotoxic edema  MAJOR EVENTS/TEST RESULTS: 04/14 Admission as above 04/14 CT head: large R MCA territory CVA with cytotoxic edema 4/17 remains intubated,obtunded  INDWELLING DEVICES:: ETT 04/14 >>   MICRO DATA: Urine 04/14 >> pending Resp 04/14  >> pending Blood 04/14 >> gram-positive cocci, identification pending. MRSA PCR 4/14, negative  ANTIMICROBIALS:  Vanc 04/14 >>  Cefepime 04/14 >>   HISTORY OF PRESENT ILLNESS:   Patient cannot provide history or review of systems as she is intubated on the ventilator and minimally responsive. Gcs<8T does not respond to voice, no meaningful movement.   Allergies  Allergen Reactions  . Singlet [Chlorphen-Pe-Acetaminophen] Shortness Of Breath  . Atorvastatin Other (See Comments)    Reaction:  Unknown   . Clindamycin Diarrhea and Nausea And Vomiting  . Codeine Other (See Comments)    Reaction:  Unknown   . Doxycycline Hives and Itching  . Epinephrine Hives and Itching  . Erythromycin Hives and Itching  . Fluticasone-Salmeterol Other (See Comments)    Reaction:  Unknown   . Lasix [Furosemide] Itching  . Levofloxacin Other (See Comments)    Reaction:  Unknown   . Penicillins Other (See Comments)    Reaction:  Unknown   . Propoxyphene Hcl Other (See Comments)    Reaction:  Unknown   . Rosuvastatin Other (See Comments)    Reaction:  Unknown   . Simvastatin Other (See Comments)    Reaction:  Unknown   . Sulfamethoxazole-Trimethoprim Other (See Comments)     Reaction:  Unknown   . Teriparatide Other (See Comments)    Reaction:  Unknown   . Tetracyclines & Related Hives and Itching  . Ceclor [Cefaclor] Hives and Other (See Comments)    Reaction:  Altered mental status    . Glipizide Hives and Itching  . Lisinopril Hives and Itching    Current Facility-Administered Medications on File Prior to Encounter  Medication  . 0.9 %  sodium chloride infusion  . alteplase (CATHFLO ACTIVASE) injection 2 mg  . ferumoxytol (FERAHEME) 510 mg in sodium chloride 0.9 % 100 mL IVPB  . heparin lock flush 100 unit/mL  . heparin lock flush 100 unit/mL  . sodium chloride 0.9 % injection 10 mL  . sodium chloride 0.9 % injection 3 mL   Current Outpatient Prescriptions on File Prior to Encounter  Medication Sig  . albuterol (PROVENTIL) (2.5 MG/3ML) 0.083% nebulizer solution Take 2.5 mg by nebulization every 6 (six) hours as needed for wheezing or shortness of breath.   . ALPRAZolam (XANAX) 0.25 MG tablet Take 1 tablet (0.25 mg total) by mouth 3 (three) times daily as needed for anxiety.  Marland Kitchen aspirin EC 81 MG tablet Take 81 mg by mouth daily with lunch.   . budesonide (PULMICORT) 0.25 MG/2ML nebulizer solution Take 2 mLs (0.25 mg total) by nebulization 2 (two) times daily.  . bumetanide (BUMEX) 1 MG tablet Take 1 tablet (1 mg total) by mouth 2 (two) times daily.  . calcium carbonate (OSCAL) 1500 (600  Ca) MG TABS tablet Take 600 mg of elemental calcium by mouth daily with breakfast.  . chlorhexidine (PERIDEX) 0.12 % solution 15 mLs by Mouth Rinse route at bedtime.   . citalopram (CELEXA) 20 MG tablet Take 20 mg by mouth daily with lunch.   . diphenhydramine-acetaminophen (TYLENOL PM) 25-500 MG TABS tablet Take 1 tablet by mouth at bedtime as needed (for sleep).  Marland Kitchen doxazosin (CARDURA) 4 MG tablet Take 4 mg by mouth at bedtime.   Marland Kitchen ezetimibe (ZETIA) 10 MG tablet Take 10 mg by mouth at bedtime.   . ferrous sulfate 325 (65 FE) MG tablet Take 1 tablet (325 mg total) by  mouth 2 (two) times daily with a meal.  . fluticasone (FLONASE) 50 MCG/ACT nasal spray Place 1 spray into both nostrils 2 (two) times daily.   Marland Kitchen gabapentin (NEURONTIN) 100 MG capsule Take 100 mg by mouth 2 (two) times daily.   Marland Kitchen guaiFENesin-dextromethorphan (ROBITUSSIN DM) 100-10 MG/5ML syrup Take 5 mLs by mouth every 4 (four) hours as needed for cough.  . hydrALAZINE (APRESOLINE) 25 MG tablet Take 1 tablet (25 mg total) by mouth every 8 (eight) hours.  Marland Kitchen HYDROcodone-acetaminophen (NORCO/VICODIN) 5-325 MG tablet Take 1 tablet by mouth every 4 (four) hours as needed for moderate pain.  . mometasone-formoterol (DULERA) 100-5 MCG/ACT AERO Inhale 2 puffs into the lungs 2 (two) times daily.  . mupirocin ointment (BACTROBAN) 2 % Apply 1 application topically daily as needed (for sores on leg).   . nebivolol (BYSTOLIC) 5 MG tablet Take 1 tablet (5 mg total) by mouth daily.  . nitroGLYCERIN (NITROSTAT) 0.4 MG SL tablet Place 0.4 mg under the tongue every 5 (five) minutes as needed for chest pain.  Bertram Gala Glycol-Propyl Glycol (SYSTANE OP) Place 1 drop into both eyes 2 (two) times daily.  . polyvinyl alcohol (LIQUIFILM TEARS) 1.4 % ophthalmic solution Place 1 drop into both eyes 2 (two) times daily.  . promethazine (PHENERGAN) 25 MG tablet Take 25 mg by mouth every 8 (eight) hours as needed for nausea or vomiting.   Marland Kitchen SPIRIVA RESPIMAT 1.25 MCG/ACT AERS Inhale 1 puff into the lungs daily.  Marland Kitchen spironolactone (ALDACTONE) 25 MG tablet Take 25 mg by mouth every Monday, Wednesday, and Friday.  . tiotropium (SPIRIVA) 18 MCG inhalation capsule Place 1 capsule (18 mcg total) into inhaler and inhale daily.  . feeding supplement, ENSURE ENLIVE, (ENSURE ENLIVE) LIQD Take 237 mLs by mouth 2 (two) times daily between meals.      VITAL SIGNS: BP 154/56 mmHg  Pulse 69  Temp(Src) 100.2 F (37.9 C) (Core (Comment))  Resp 16  Ht 5\' 1"  (1.549 m)  Wt 168 lb 14 oz (76.6 kg)  BMI 31.92 kg/m2  SpO2  100%  HEMODYNAMICS:    VENTILATOR SETTINGS: Vent Mode:  [-] PRVC FiO2 (%):  [40 %] 40 % Set Rate:  [16 bmp] 16 bmp Vt Set:  [500 mL] 500 mL PEEP:  [5 cmH20] 5 cmH20  INTAKE / OUTPUT: I/O last 3 completed shifts: In: 7605.3 [I.V.:3215.3; NG/GT:2140; IV Piggyback:2250] Out: 2585 [Urine:2585]  PHYSICAL EXAMINATION: General: Elderly, chronically ill appearing, RASS -5, intubated Neuro: No spontaneous movement, PERRL, dysconjugate pupils, no withdrawal, decreased tone on L, no spontaneous movements of the left upper or lower extremity. HEENT:atraumatic, normocephalic, no discharge noted Cardiovascular: S1S2, no MRG, RRR Lungs: decreased BS, , no wheezes, decreased air entry bilaterally. Abdomen: soft, NT, +BS Ext: multiple ecchymoses, no edema, warm  LABS:  BMET  Recent Labs Lab 02/04/16  0517 02/05/16 0539  0504  NA 133* 131* 131*  K 3.6 4.0 4.3  CL 93* 94* 93*  CO2 33* 32 32  BUN 40* 37* 26*  CREATININE 0.91 0.68 0.55  GLUCOSE 238* 230* 217*    Electrolytes  Recent Labs Lab 02/04/16 0517 02/05/16 0539  0504  CALCIUM 7.9* 7.8* 7.8*  MG 1.6*  --   --   PHOS 1.8*  --   --     CBC  Recent Labs Lab 02/04/16 0517 02/05/16 0539  0504  WBC 17.8* 14.1* 10.3  HGB 8.3* 7.7* 7.6*  HCT 26.1* 24.4* 24.0*  PLT 270 288 266    Coag's  Recent Labs Lab 2016-02-23 0835  INR 1.28    Sepsis Markers  Recent Labs Lab 02-23-2016 0835 02/04/16 0517 02/05/16 0539  LATICACIDVEN 1.9  --   --   PROCALCITON  --  0.85 0.50    ABG  Recent Labs Lab 02-23-16 1250 02/05/16 0453  0620  PHART 7.41 7.44 7.37  PCO2ART 52* 54* 60*  PO2ART 122* 148* 121*    Liver Enzymes  Recent Labs Lab Feb 23, 2016 0835  AST 223*  ALT 187*  ALKPHOS 101  BILITOT 0.8  ALBUMIN 3.6    Cardiac Enzymes  Recent Labs Lab 02/23/2016 0835 02/23/2016 1643 02/23/2016 2238  TROPONINI 0.46* 0.41* 0.40*    Glucose  Recent Labs Lab 02/05/16 0720  02/05/16 1157 02/05/16 1959 02/05/16 2341  0337  0759  GLUCAP 181* 161* 306* 186* 226* 216*    CXR:  Vague R basilar opacity   ASSESSMENT / PLAN: 82 yo white female admitted to ICU for acute resp failure from acute encephalopathy from acute RT MCA CVA   PULMONARY A: Acute on chronic respiratory failure COPD - by clinical history and exam, appears to be severe Suspect RLL PNA Review of chest x-ray films shows continued right lower lobe atelectasis. P:   May consider stopping abx  Routine ABG CXR as needed Waan vent settings as tolerated by the patient CARDIOVASCULAR A:  H/O diastolic CHF H/O PAF MVP P:  Monitor rhythm and hemodynamics MAP goal > 65 mmHg  RENAL A:   AKI Mild hyperkalemia  P:    Continue to Monitor BMET intermittently  Continue to  Monitor I/Os Replace electrolytes as per protocol Maintenance IVFs until TFs @ goal  GASTROINTESTINAL A:   No issues P:   SUP: IV PPI TF protocol  HEMATOLOGIC A:   Mild chronic anemia without acute blood loss P:   Continue DVT px: SQ heparin  Continue  To monitor CBC intermittently Transfuse per icu-guidelines  INFECTIOUS A:   Suspected RLL PNA P:   Monitor temp, WBC count Micro and abx as above  ENDOCRINE A:   Chronic prednisone Risk of steroid induced hyperglycemia P:  Continue low dose steroids Continue SSI  NEUROLOGIC A:   Massive R MCA territory acute CVA Chronic debilitation P:   RASS goal: -1, -2 PAD protocol Neurology consultation appreciated Continue mannitol per neurology Holding off on 3% saline , will not change the outcome per neurology    Bincy Varughese,AG-ACNP Pulmonary & Critical Care  STAFF NOTE: I, Dr. Lucie Leather,  have personally reviewed patient's available data, including medical history, events of note, physical examination and test results as part of my evaluation. I have discussed with NP and other care providers such as pharmacist, RN  and RRT.  In addition,  I personally evaluated patient and elicited key findings  A:acute resp failure and acute encephalopathy from acute CVA  P: will need to update family and assess for terminal wean and extubation, comfort care measures    The Rest per NP whose note is outlined above and that I agree with  I have personally reviewed/obtained a history, examined the patient, evaluated Pertinent laboratory and [employer]/imaging results, and  formulated the assessment and plan   The Patient requires high complexity decision making for assessment and support, frequent evaluation and titration of therapies, application of advanced monitoring technologies and extensive interpretation of multiple databases. Critical Care Time devoted to patient care services described in this note is 35 minutes.  This Critical care time does not reflrect procedure time or supervisory time of NP but could involve care discussion time Overall, patient is critically ill, prognosis is guarded.  Patient with Multiorgan failure and at high risk for cardiac arrest and death.    Lucie LeatherKurian David Roniqua Kintz, M.D.  [facility]  Medical Director [facility] Medical Director [facility] Cardio[facility] Department

## 2016-02-06 NOTE — Progress Notes (Signed)
Family Discussion: I have updated daughter, I have cancelled CT head as this would not change management. Daughter deciding on time frame for comfort care measures  Patient/Family are satisfied with Plan of action and management. All questions answered  Lucie LeatherKurian David Gwynevere Lizana, M.D.  [facility]  Medical Director [facility] Medical Director [facility] Cardio-P[facility] Department

## 2016-02-07 LAB — CULTURE, RESPIRATORY W GRAM STAIN

## 2016-02-07 LAB — CULTURE, RESPIRATORY

## 2016-02-08 LAB — CULTURE, BLOOD (ROUTINE X 2): CULTURE: NO GROWTH

## 2016-02-16 ENCOUNTER — Encounter: Payer: Medicare Other | Admitting: Cardiovascular Disease

## 2016-02-20 DEATH — deceased

## 2016-03-22 NOTE — Discharge Summary (Signed)
DEATH SUMMARY  DATE OF ADMISSION:  01/31/2016   DATE OF DISCHARGE/DEATH:    ADMISSION DIAGNOSES:   Massive R MCA territory acute CVA Acute (due to AMS) on chronic (COPD) respiratory failure COPD - by clinical history and exam, appears to be severe Suspected RLL PNA H/O diastolic CHF H/O PAF MVP AKI Mild hyperkalemia Not currently a candidate for HD Mild chronic anemia without acute blood loss Chronic prednisone therapy Risk of steroid induced hyperglycemia Chronic debilitation   DISCHARGE DIAGNOSES:   Massive R MCA territory acute CVA Acute (due to AMS) on chronic (COPD) respiratory failure COPD - by clinical history and exam, appears to be severe Suspected RLL PNA H/O diastolic CHF H/O PAF MVP AKI Mild hyperkalemia Not currently a candidate for HD Mild chronic anemia without acute blood loss Chronic prednisone therapy Risk of steroid induced hyperglycemia Chronic debilitation   PRESENTATION:   Pt was admitted with the following HPI and the above admission diagnoses:  [age] F with COPD, CHF [facility] for acute on chronic respiratory failure. [facility]F for [facility]. On the morning of this admission, she was found by SNF personnel agonal and unresponsive and transported to ED where she was intubated. CT head reveals massive R MCA territory CVA with cytotoxic edema  HOSPITAL COURSE:   She was intubated in ED and supported on mech vent throughout her hospitalization. Upon admission, discussions were undertaken re: poor prognosis and goals of care. Neurology consultation was performed confirming very poor prognosis. On 02/05/16 family decided to discontinue vent support. Pt passed away peacefully. No autopsy was performed   Billy Fischeravid Rebekka Lobello, MD PCCM service Mobile [phone] Pager [phone] 02/26/2016

## 2016-04-09 ENCOUNTER — Other Ambulatory Visit: Payer: Self-pay | Admitting: Nurse Practitioner

## 2016-06-13 IMAGING — CR DG CHEST 2V
1 series · 2 of 2 positions shown · non-contrast
Comparison: November 20, 2015

CLINICAL DATA: Follow-up pneumonia.

EXAM:
CHEST  2 VIEW

[Series 1: dg chest 2 view · 0.14mm/px · 2 of 2 slices shown]
[im 1/2]
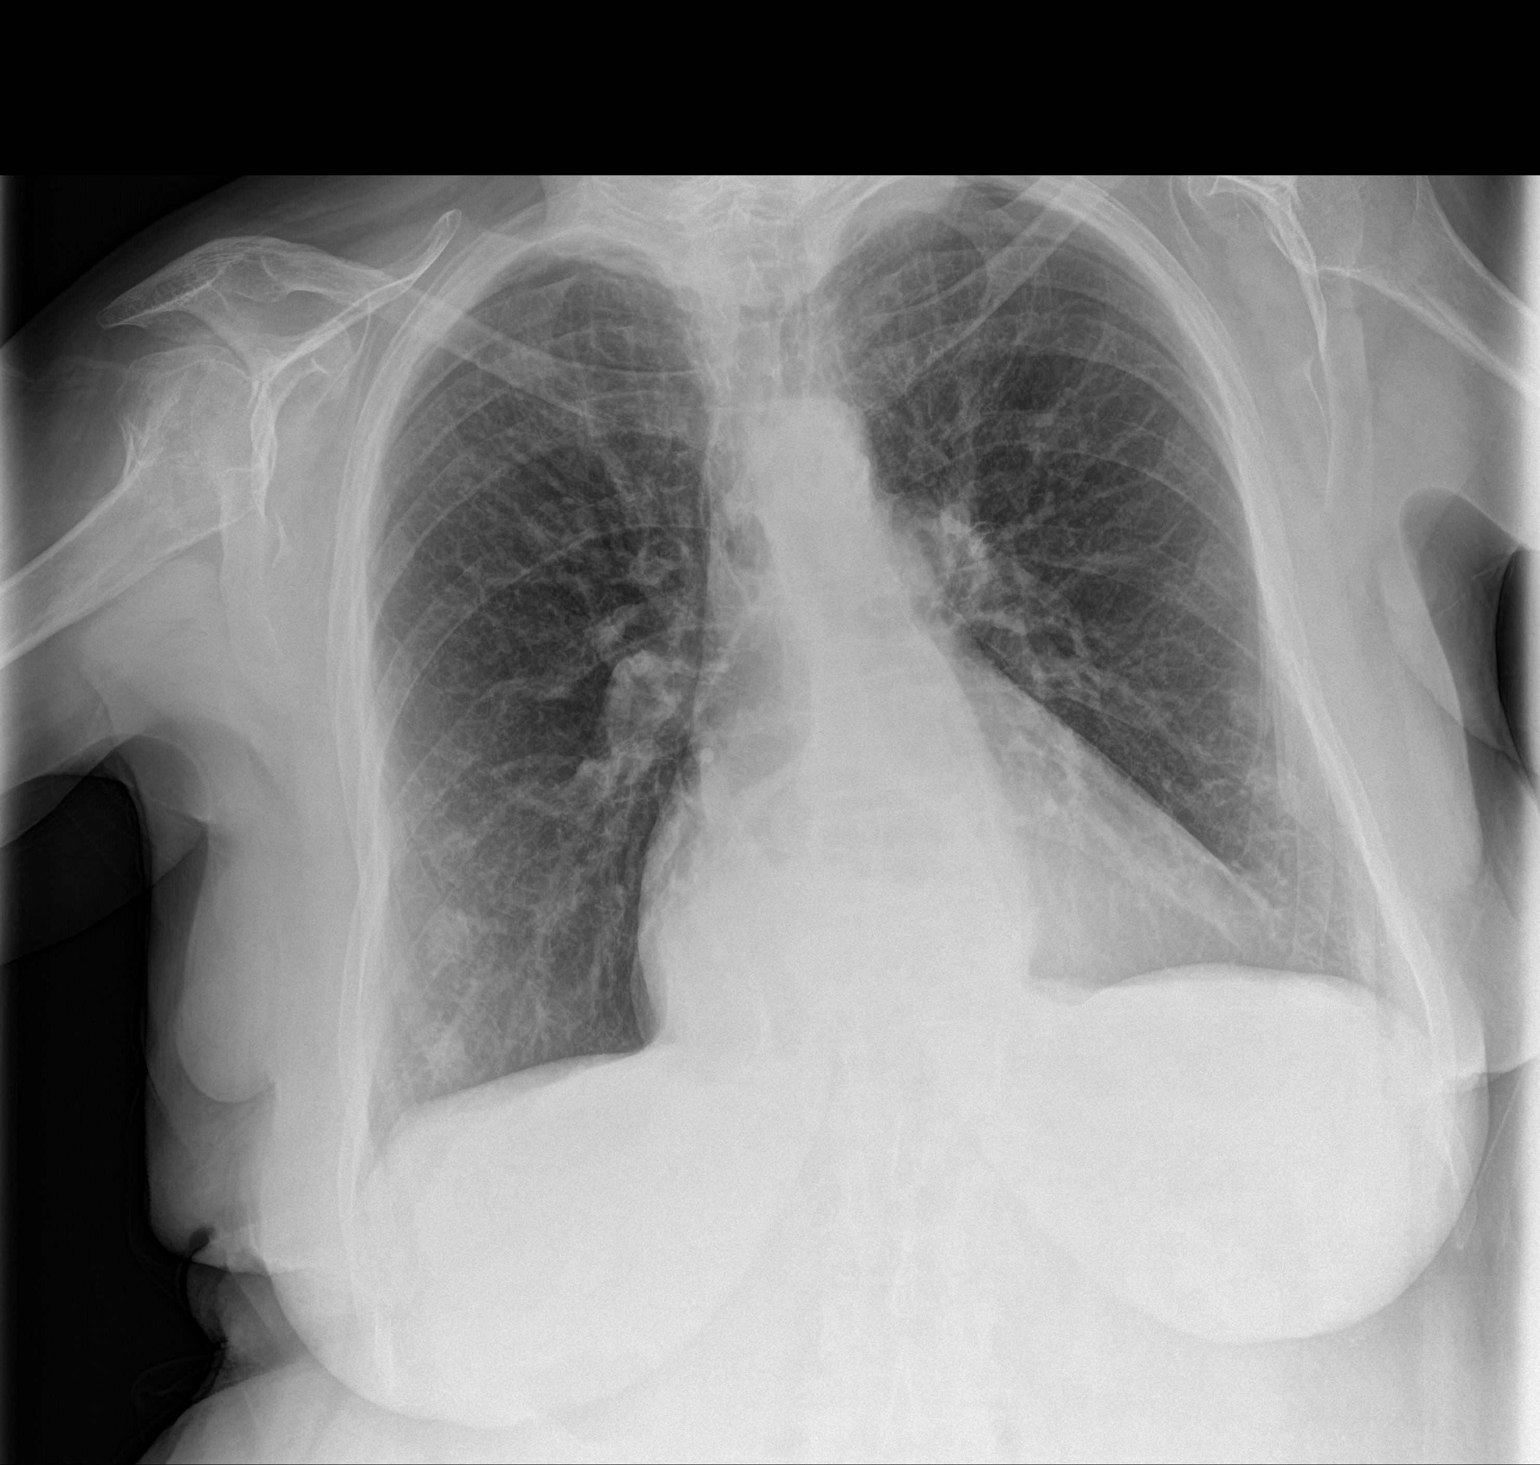
[im 2/2]
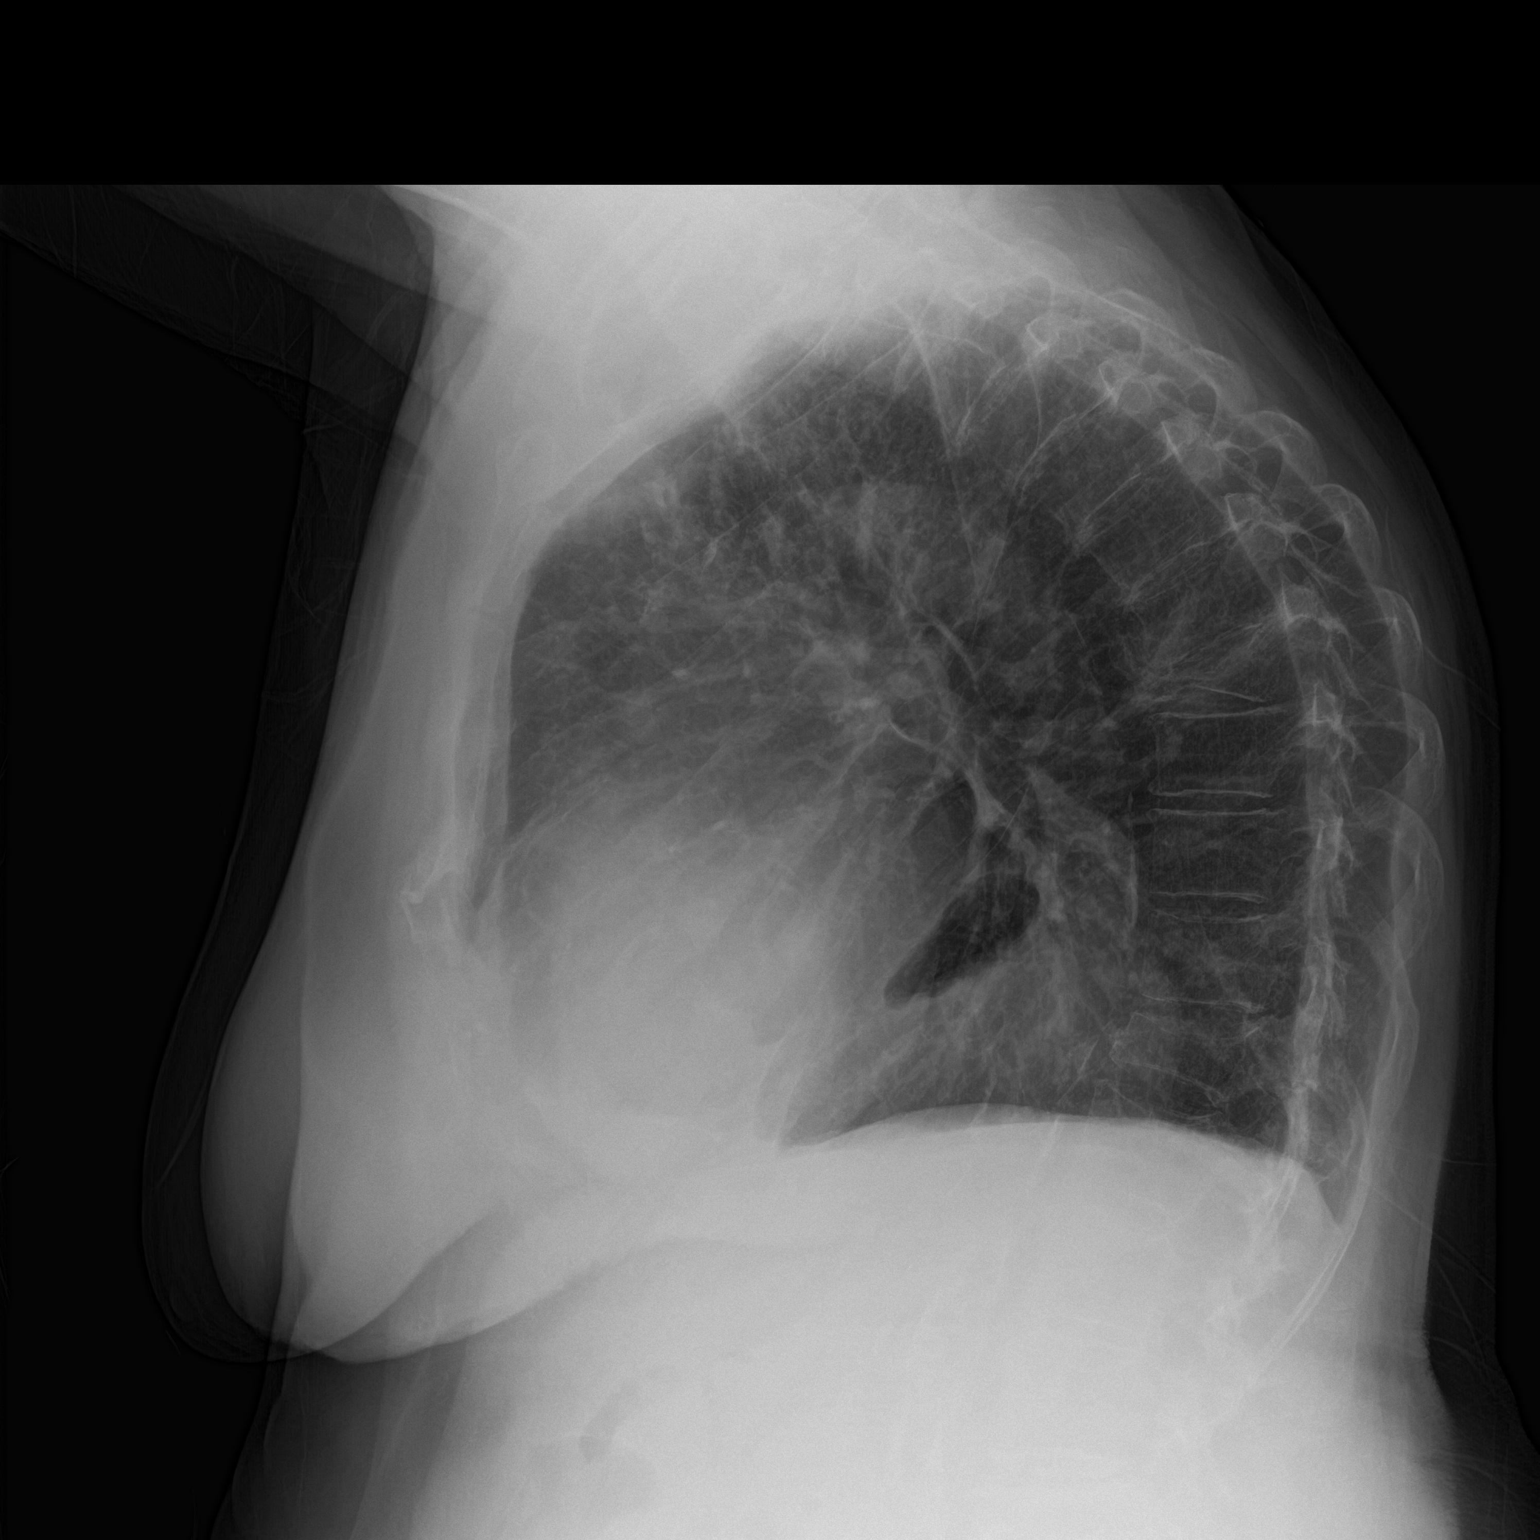

[2 of 2 positions shown; findings below may reference images not displayed]

FINDINGS: The heart size and mediastinal contours are stable. The heart size
is enlarged. There is a hiatal hernia. There is diffuse increased
pulmonary interstitium unchanged. No focal pneumonia or pleural
effusion is noted. The visualized skeletal structures are stable.
IMPRESSION: Stable mild interstitial prominence unchanged compared to prior
exam. No focal pneumonia.

## 2016-08-13 IMAGING — DX DG CHEST 1V PORT
1 series · 1 of 1 positions shown · non-contrast
Comparison: Yesterday.

CLINICAL DATA: Respiratory failure.

EXAM:
PORTABLE CHEST 1 VIEW

[chest ap]
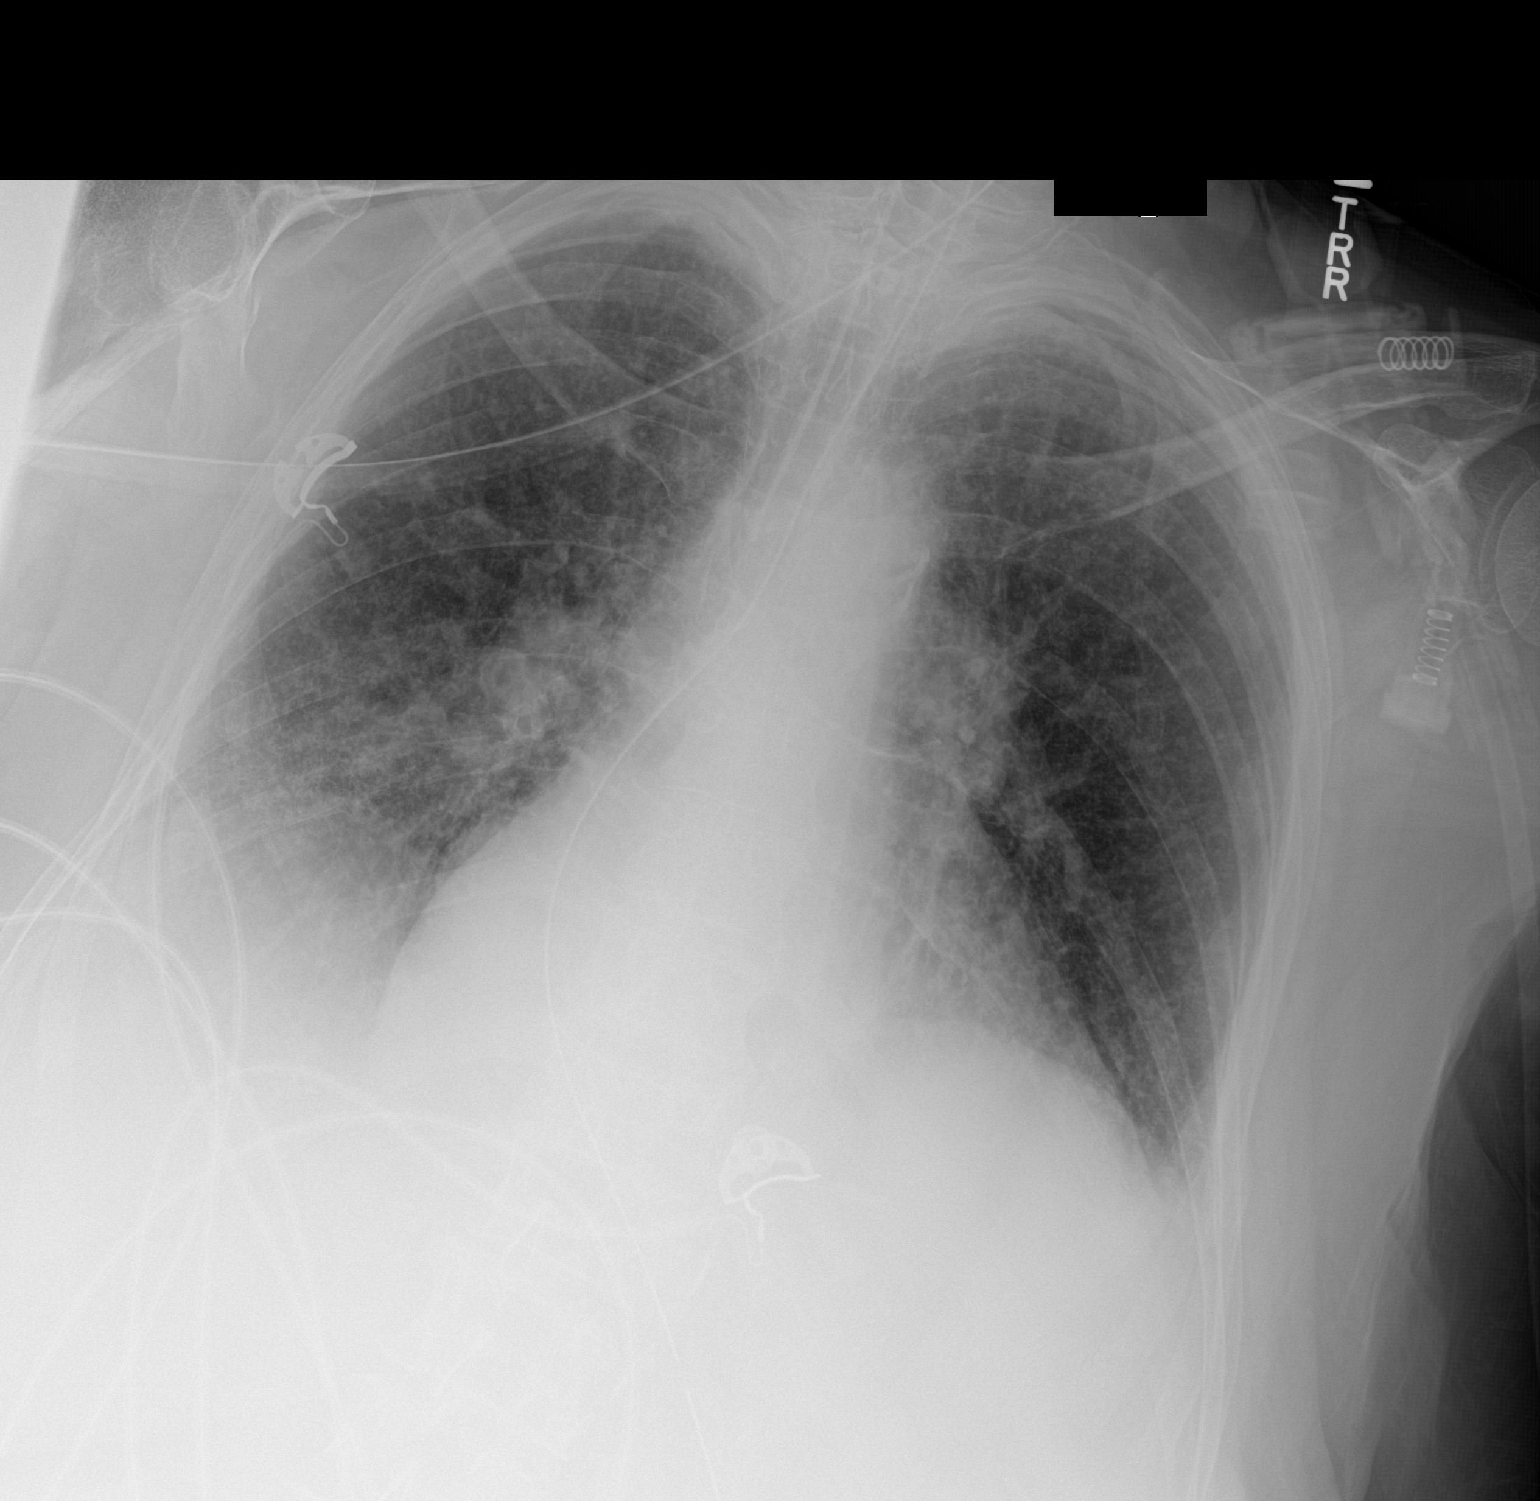

[1 of 1 positions shown; findings below may reference images not displayed]

FINDINGS: Endotracheal tube tip 1 cm above the carina. Nasogastric tube
extending into the stomach.

Decreased inspiration with crowding of the lung markings. Stable
enlarged cardiac silhouette and prominent pulmonary vasculature and
interstitial markings. Diffuse osteopenia.
IMPRESSION: Stable cardiomegaly, pulmonary vascular congestion and chronic
interstitial lung disease with possible mild superimposed
interstitial pulmonary edema.

## 2016-08-15 IMAGING — DX DG CHEST 1V
1 series · 1 of 1 positions shown · non-contrast
Comparison: Portable chest x-ray dated February 05, 2016.

CLINICAL DATA: Dyspnea, respiratory arrest, CHF, diabetes, COPD.

EXAM:
CHEST 1 VIEW

[chest ap]
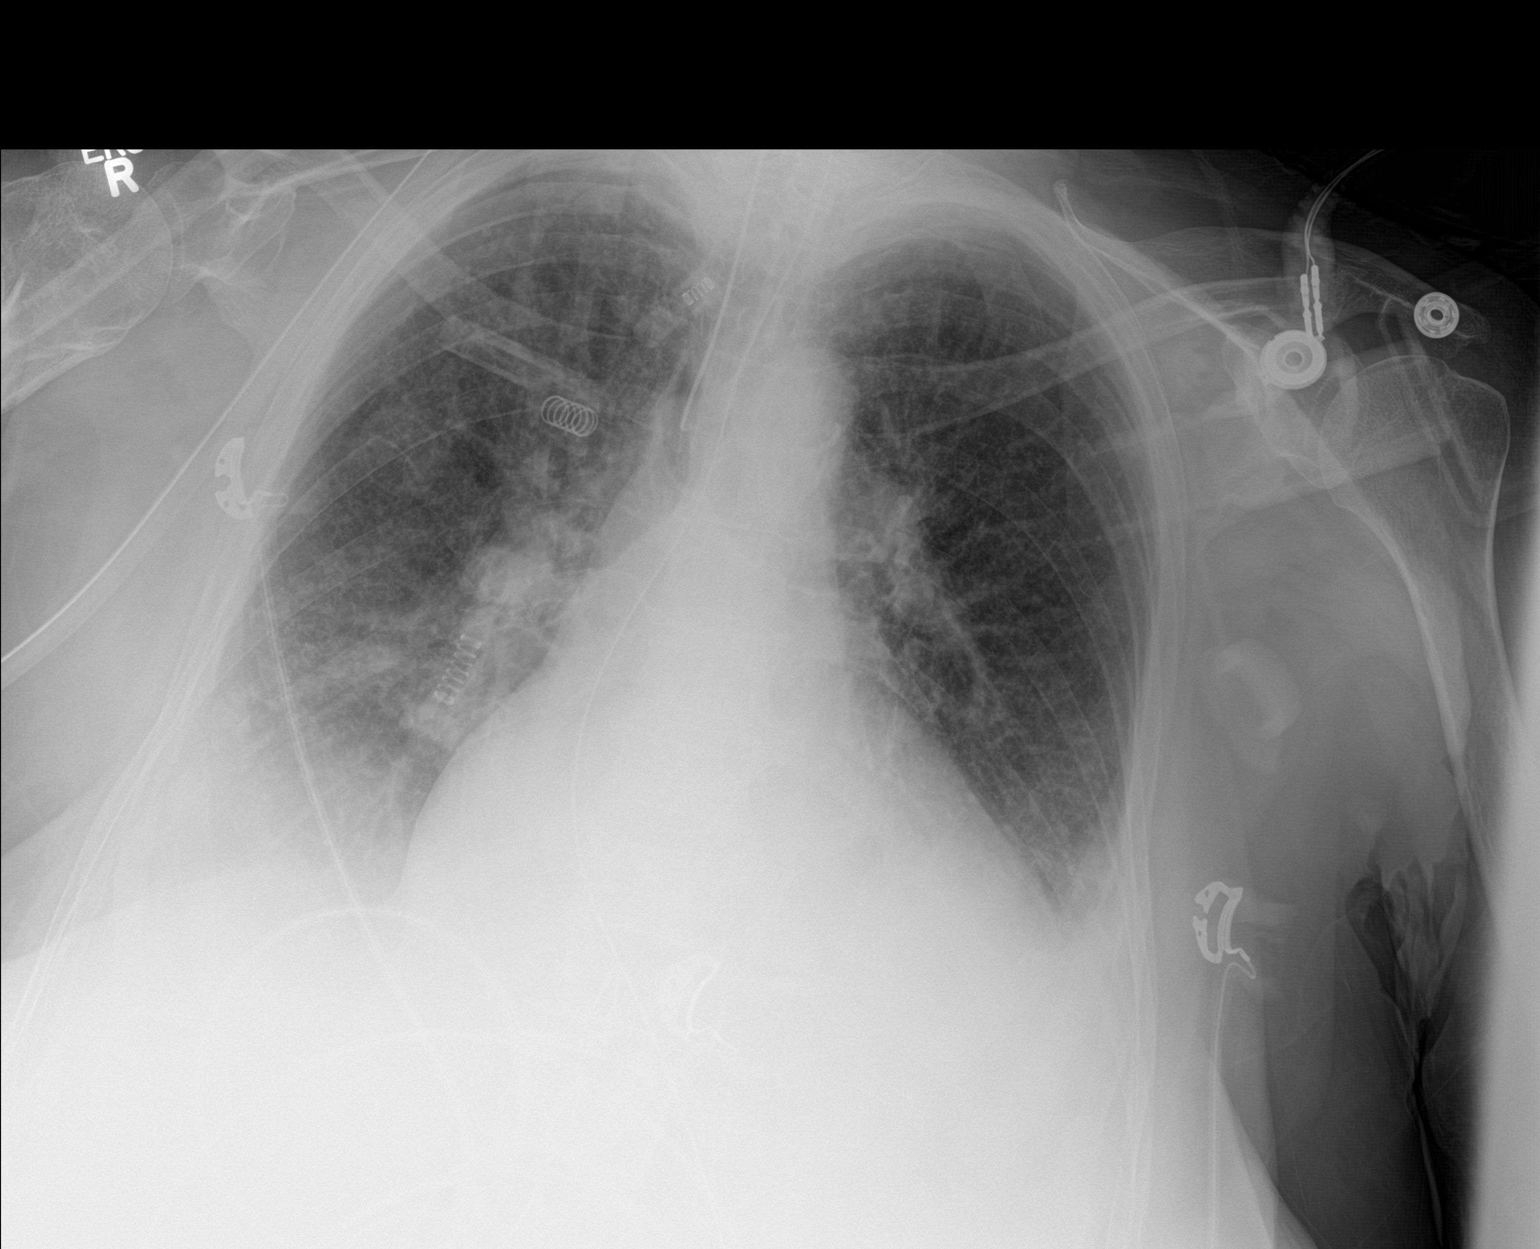

[1 of 1 positions shown; findings below may reference images not displayed]

FINDINGS: The lungs are at areae interstitial markings remain increased
bilaterally. There are moderate-sized bilateral pleural effusions.
The cardiac silhouette remains enlarged the pulmonary vascularity
remains engorged. The endotracheal tube tip lies 1.7 cm above the
carina. The esophagogastric tube tip projects below the inferior
margin of the image.
IMPRESSION: CHF with pulmonary interstitial edema and bilateral pleural
effusions superimposed upon COPD. No discrete pneumonia. No
pneumothorax. No significant change since yesterday's study. The
endotracheal tube tip lies 1.7 cm above the carina. Consideration
for withdrawal by approximately 1 cm would be useful to help avoid
accidental mainstem bronchus intubation with patient movement.
